# Patient Record
Sex: Female | Born: 1975 | Hispanic: Yes | Marital: Married | Smoking: Never smoker
Health system: Southern US, Community
[De-identification: ages and names within clinical notes are randomized; demographics above are authoritative.]

## PROBLEM LIST (undated history)

## (undated) ENCOUNTER — Emergency Department (HOSPITAL_COMMUNITY): Admission: EM | Payer: MEDICAID | Source: Home / Self Care

## (undated) DIAGNOSIS — H3563 Retinal hemorrhage, bilateral: Secondary | ICD-10-CM

## (undated) DIAGNOSIS — D649 Anemia, unspecified: Secondary | ICD-10-CM

## (undated) DIAGNOSIS — F32A Depression, unspecified: Secondary | ICD-10-CM

## (undated) DIAGNOSIS — R51 Headache: Secondary | ICD-10-CM

## (undated) DIAGNOSIS — R519 Headache, unspecified: Secondary | ICD-10-CM

## (undated) DIAGNOSIS — D693 Immune thrombocytopenic purpura: Secondary | ICD-10-CM

## (undated) DIAGNOSIS — F329 Major depressive disorder, single episode, unspecified: Secondary | ICD-10-CM

## (undated) HISTORY — PX: BREAST SURGERY: SHX581

## (undated) HISTORY — DX: Retinal hemorrhage, bilateral: H35.63

---

## 2000-09-10 ENCOUNTER — Emergency Department (HOSPITAL_COMMUNITY): Admission: EM | Admit: 2000-09-10 | Discharge: 2000-09-10 | Payer: Self-pay | Admitting: Emergency Medicine

## 2001-07-14 ENCOUNTER — Emergency Department (HOSPITAL_COMMUNITY): Admission: EM | Admit: 2001-07-14 | Discharge: 2001-07-14 | Payer: Self-pay | Admitting: Internal Medicine

## 2002-01-05 ENCOUNTER — Ambulatory Visit (HOSPITAL_COMMUNITY): Admission: RE | Admit: 2002-01-05 | Discharge: 2002-01-05 | Payer: Self-pay | Admitting: Obstetrics and Gynecology

## 2002-02-18 ENCOUNTER — Ambulatory Visit (HOSPITAL_COMMUNITY): Admission: RE | Admit: 2002-02-18 | Discharge: 2002-02-18 | Payer: Self-pay | Admitting: Obstetrics and Gynecology

## 2002-02-22 ENCOUNTER — Inpatient Hospital Stay (HOSPITAL_COMMUNITY): Admission: AD | Admit: 2002-02-22 | Discharge: 2002-02-24 | Payer: Self-pay | Admitting: Obstetrics and Gynecology

## 2002-08-10 ENCOUNTER — Emergency Department (HOSPITAL_COMMUNITY): Admission: EM | Admit: 2002-08-10 | Discharge: 2002-08-10 | Payer: Self-pay | Admitting: Internal Medicine

## 2002-08-14 ENCOUNTER — Emergency Department (HOSPITAL_COMMUNITY): Admission: EM | Admit: 2002-08-14 | Discharge: 2002-08-14 | Payer: Self-pay | Admitting: *Deleted

## 2003-07-23 ENCOUNTER — Emergency Department (HOSPITAL_COMMUNITY): Admission: EM | Admit: 2003-07-23 | Discharge: 2003-07-24 | Payer: Self-pay | Admitting: *Deleted

## 2004-03-28 ENCOUNTER — Ambulatory Visit: Payer: Self-pay | Admitting: Family Medicine

## 2004-04-13 ENCOUNTER — Ambulatory Visit: Payer: Self-pay | Admitting: Family Medicine

## 2004-04-14 ENCOUNTER — Ambulatory Visit (HOSPITAL_COMMUNITY): Admission: RE | Admit: 2004-04-14 | Discharge: 2004-04-14 | Payer: Self-pay | Admitting: Family Medicine

## 2004-04-14 ENCOUNTER — Ambulatory Visit: Payer: Self-pay | Admitting: *Deleted

## 2004-04-29 ENCOUNTER — Ambulatory Visit: Payer: Self-pay | Admitting: Family Medicine

## 2004-06-09 ENCOUNTER — Ambulatory Visit: Payer: Self-pay | Admitting: Family Medicine

## 2005-10-16 ENCOUNTER — Emergency Department (HOSPITAL_COMMUNITY): Admission: EM | Admit: 2005-10-16 | Discharge: 2005-10-16 | Payer: Self-pay | Admitting: Emergency Medicine

## 2006-09-11 ENCOUNTER — Emergency Department (HOSPITAL_COMMUNITY): Admission: EM | Admit: 2006-09-11 | Discharge: 2006-09-11 | Payer: Self-pay | Admitting: Emergency Medicine

## 2006-12-18 ENCOUNTER — Inpatient Hospital Stay (HOSPITAL_COMMUNITY): Admission: RE | Admit: 2006-12-18 | Discharge: 2006-12-20 | Payer: Self-pay | Admitting: Obstetrics

## 2009-11-26 ENCOUNTER — Emergency Department (HOSPITAL_COMMUNITY): Admission: EM | Admit: 2009-11-26 | Discharge: 2009-11-26 | Payer: Self-pay | Admitting: Emergency Medicine

## 2009-12-13 ENCOUNTER — Inpatient Hospital Stay (HOSPITAL_COMMUNITY)
Admission: EM | Admit: 2009-12-13 | Discharge: 2009-12-16 | Payer: Self-pay | Source: Home / Self Care | Admitting: Emergency Medicine

## 2009-12-14 ENCOUNTER — Encounter (INDEPENDENT_AMBULATORY_CARE_PROVIDER_SITE_OTHER): Payer: Self-pay | Admitting: Internal Medicine

## 2009-12-14 ENCOUNTER — Encounter (INDEPENDENT_AMBULATORY_CARE_PROVIDER_SITE_OTHER): Payer: Self-pay

## 2009-12-31 ENCOUNTER — Ambulatory Visit: Payer: Self-pay | Admitting: Internal Medicine

## 2009-12-31 DIAGNOSIS — N61 Mastitis without abscess: Secondary | ICD-10-CM

## 2010-02-24 ENCOUNTER — Encounter
Admission: RE | Admit: 2010-02-24 | Discharge: 2010-02-24 | Payer: Self-pay | Source: Home / Self Care | Attending: General Surgery | Admitting: General Surgery

## 2010-03-22 ENCOUNTER — Ambulatory Visit: Payer: Self-pay | Admitting: Oncology

## 2010-03-22 ENCOUNTER — Ambulatory Visit: Admit: 2010-03-22 | Payer: Self-pay | Admitting: Internal Medicine

## 2010-04-19 NOTE — Assessment & Plan Note (Signed)
Summary: HOSP FU/BREAST ABCESS///[place]   Vital Signs:  Patient profile:   36 year old female Height:      64 inches Weight:      191 pounds BMI:     32.90 Temp:     97.2 degrees F oral Pulse rate:   80 / minute Pulse rhythm:   regular Resp:     16 per minute BP sitting:   100 / 60  (left arm) Cuff size:   large  Vitals Entered By: Arm[place] Shannon (December 31, 2009 1:04 PM) CC: pt is here for abscess for left breast.... pt says it is draining with pus..... Is Patient Diabetic? No Pain Assessment Patient in pain? no       Does patient need assistance? Functional Status Self care Ambulation Normal   CC:  pt is here for abscess for left breast.... pt says it is draining with pus......  History of Present Illness: 35 yo female reestablishing after 3 or more years.  Concerns:  1.  Breast abscess:  was admitted to hospital with breast abscess 12/13/09 and discharged on the 29th.   Underwent I and D and biopsy of fibrotic breast mass during hospitalization with Dr. Andrey Campanile, surgery.  Biopsy did not show any malignancy.   Was initially treated for abscess with Keflex and then switched to  Clindamycin when required admission--treated for 1 more week after discharge with oral version Having some burning and itching from site.  Is still having creme colored discharge from breast.  No odor.  No fever.  Still following with surgery.  2.  Family hx of gynecologic cancers.  3 of 6  sisters and mother--all in [place].  Current Medications (verified): 1)  None  Allergies (verified): No Known Drug Allergies  Physical Exam  Breasts:  Left medical breast with deep open wound with good lining of pink-red granulation tissue after packing removed.  No discharge or odor noted.  No surrounding erythema.  Tender with removal and reinstallation of packing.  4x4 cut in half, dampened with sterile saline and loosely packed into wound.   Impression & Recommendations:  Problem # 1:  ABSCESS, BREAST,  LEFT (ICD-611.0) Healing by secondary intention. Appears to be healing well. To continue to follow with surgery.  Problem # 2:  ? of GENETIC SUSCEPTIBILITY GYNECOLOGIC MALIGNANT NEOPLASM (ICD-V84.09) Pt. to question family members to get better history, but sounds like multiple female family members with breast/ovarian cancer hx.  Complete Medication List: 1)  Hydrocodone-acetaminophen 5-500 Mg Tabs (Hydrocodone-acetaminophen) .Marland Kitchen.. 1 tab by mouth every 4 hours as needed for pain  Patient Instructions: 1)  CPP with Dr. Delrae Alfred next available--need all of sister's and mother's/grandmother's history of cancer or other health problems Prescriptions: HYDROCODONE-ACETAMINOPHEN 5-500 MG TABS (HYDROCODONE-ACETAMINOPHEN) 1 tab by mouth every 4 hours as needed for pain  #30 x 0   Entered and Authorized by:   Julieanne Manson MD   Signed by:   Julieanne Manson MD on 12/31/2009   Method used:   Print then Give to Patient   RxID:   [id number]

## 2010-04-21 NOTE — Letter (Signed)
Summary: Discharge Summary  Discharge Summary   Imported By: Arta Bruce 03/15/2010 16:53:06  _____________________________________________________________________  External Attachment:    Type:   Image     Comment:   External Document

## 2010-05-05 ENCOUNTER — Other Ambulatory Visit: Payer: Self-pay | Admitting: General Surgery

## 2010-05-05 DIAGNOSIS — N6320 Unspecified lump in the left breast, unspecified quadrant: Secondary | ICD-10-CM

## 2010-06-02 LAB — CULTURE, ROUTINE-ABSCESS: Culture: NO GROWTH

## 2010-06-02 LAB — BASIC METABOLIC PANEL
BUN: 15 mg/dL (ref 6–23)
CO2: 28 mEq/L (ref 19–32)
Calcium: 9.2 mg/dL (ref 8.4–10.5)
GFR calc non Af Amer: >60 mL/min (ref >60)
Glucose, Bld: 113 mg/dL — ABNORMAL HIGH (ref 70–99)
Potassium: 3.6 mEq/L (ref 3.5–5.1)
Sodium: 138 mEq/L (ref 135–145)

## 2010-06-02 LAB — BASIC METABOLIC PANEL WITH GFR
Chloride: 105 meq/L (ref 96–112)
Creatinine, Ser: 0.6 mg/dL (ref 0.4–1.2)
GFR calc Af Amer: >60 mL/min (ref >60)

## 2010-06-02 LAB — CBC
HCT: 37.6 % (ref 36.0–46.0)
Hemoglobin: 12.5 g/dL (ref 12.0–15.0)
MCH: 27.7 pg (ref 26.0–34.0)
MCHC: 33.2 g/dL (ref 30.0–36.0)
MCV: 83.4 fL (ref 78.0–100.0)
Platelets: 227 K/uL (ref 150–400)
RBC: 4.51 MIL/uL (ref 3.87–5.11)
RDW: 13.4 % (ref 11.5–15.5)
WBC: 7.7 K/uL (ref 4.0–10.5)

## 2010-06-02 LAB — DIFFERENTIAL
Basophils Absolute: 0 10*3/uL (ref 0.0–0.1)
Basophils Relative: 0 % (ref 0–1)
Eosinophils Absolute: 0.2 10*3/uL (ref 0.0–0.7)
Eosinophils Relative: 2 % (ref 0–5)
Lymphocytes Relative: 30 % (ref 12–46)
Lymphs Abs: 2.3 K/uL (ref 0.7–4.0)
Monocytes Absolute: 0.4 10*3/uL (ref 0.1–1.0)
Monocytes Relative: 5 % (ref 3–12)
Neutro Abs: 4.8 10*3/uL (ref 1.7–7.7)
Neutrophils Relative %: 62 % (ref 43–77)

## 2010-06-02 LAB — ANAEROBIC CULTURE

## 2010-06-06 ENCOUNTER — Ambulatory Visit: Payer: Self-pay

## 2010-06-06 ENCOUNTER — Other Ambulatory Visit: Payer: Self-pay

## 2010-08-05 NOTE — H&P (Signed)
   NAMEGLORIAN, MCDONELL                       ACCOUNT NO.:  [id number]   MEDICAL RECORD NO.:  [id number]                   PATIENT TYPE:  INP   LOCATION:  A428                                 FACILITY:  [facility]   PHYSICIAN:  Langley Gauss, M.D.                DATE OF BIRTH:  1976-01-17   DATE OF ADMISSION:  02/22/2002  DATE OF DISCHARGE:                                HISTORY & PHYSICAL   HISTORY OF PRESENT ILLNESS:  The patient is a 35 year old gravida 4 para 3  at 39-1/[redacted] weeks gestation who presents to [facility] presenting in  early labor.  The patient's prenatal course has been uncomplicated.  She did  receive prenatal care through the office of [facility].  Group B  strep is noted to be negative.   PAST OBSTETRICAL HISTORY:  She had three prior vaginal deliveries without  complications by her report - all three of these infants delivered in  [place].   ALLERGIES:  She has no known drug allergies.   PAST MEDICAL HISTORY:  Noncontributory.   MEDICATIONS:  Prenatal vitamins only.   PHYSICAL EXAMINATION:  GENERAL:  Mild distress with uterine contractions.  VITAL SIGNS:  Height 5 feet 2 inches, 171 pounds, 104/63, pulse of 77,  respiratory rate of 20.  HEENT:  Negative.  No adenopathy.  Neck is supple.  Thyroid is nonpalpable.  LUNGS:  Clear.  CARDIOVASCULAR:  Regular rate and rhythm.  ABDOMEN:  Soft and nontender.  No surgical scars are identified.  She is  vertex presentation by Leopold's maneuver.  Fundal height is 36 cm.  External fetal monitor reveals irregular uterine contractions occurring q.2-  42m. with a reassuring fetal heart rate.  CERVIX: Cervix is 4 cm dilated, -1 station, 70% effaced, vertex  presentation.  Fetal scalp electrode was placed with resultant assisted  rupture of membranes; clear amniotic fluid is noted.   ASSESSMENT AND PLAN:  Term intrauterine pregnancy, gravida 4 para 3, with  clear amniotic fluid.  The patient may possibly  progress very rapidly  through the course of labor and is thus to be monitored very closely.                                               Langley Gauss, M.D.    DC/MEDQ  D:  02/24/2002  T:  02/24/2002  Job:  [id number]   cc:   [facility]  [address]  [place], [place] [address]

## 2010-08-05 NOTE — Op Note (Signed)
NAMESAMARIE, [name]                       ACCOUNT NO.:  [id number]   MEDICAL RECORD NO.:  [id number]                   PATIENT TYPE:  INP   LOCATION:  A428                                 FACILITY:  [facility]   PHYSICIAN:  [name], M.D.                DATE OF BIRTH:  [birth year]   DATE OF PROCEDURE:  02/22/2002  DATE OF DISCHARGE:                                 OPERATIVE REPORT   DESCRIPTION OF PROCEDURE:   DIAGNOSIS:  A 39+ week intrauterine pregnancy, presenting in labor.   DELIVERY FORM:  Spontaneous assisted vaginal delivery of a viable female  infant delivered over an intact perineum.  Finding at time of delivery  included a nuchal cord x1 without compression, which is reduced.  Delivery  performed by [name], M.D.   ESTIMATED BLOOD LOSS:  Less than 500 cc.   MEDICATIONS:  Analgesia during the course of labor is IV Nubain only.   SPECIMENS:  Arterial cord gas and cord blood are obtained.  The placenta is  examined and noted to be apparently intact with a three-vessel umbilical  cord.   COMPLICATIONS OF DELIVERY:  None.   SUMMARY:  The patient presented at 39+ weeks' gestation, presenting in the  early stages of active labor in this gravida 4, para 3.  Initial  examination:  4 cm dilated.  A fetal scalp electrode was placed with  resultant amniotomy, findings of clear amniotic fluid.  The patient had a  reassuring fetal heart rate; however, thereafter she failed to achieve  adequate labor pattern.  Thus, Pitocin augmentation was necessary due to  inadequate frequency and intensity of uterine contractions.  With onset of  discomfortable uterine contractions, the patient requested IV pain  medication only.  The patient remained at 6 cm dilatation times several  hours; however, with improvement in labor pattern she progressed very  rapidly to complete dilatation.  She was placed in the dorsal lithotomy  position and prepped and draped in the usual sterile  manner, pushed very  effectively during a short second stage of labor to deliver in a direct OA  position over an intact perineum.  Mouth and nares were bulb-suctioned of  clear amniotic fluid, and a nuchal cord x1 is reduced.  Spontaneous rotation  occurred to a left anterior shoulder position, and gentle downward traction  combined with expulsive efforts resulted in delivery of this anterior  shoulder beneath the pubic symphysis without difficulty.  The umbilical cord  is then milked toward the infant, and the cord is doubly clamped and cut,  and infant is handed to the nursing staff for immediate assessment.  Arterial cord gas and cord blood was then obtained.  Gentle traction on the  umbilical cord results in separation, which upon examination appears to be  an  intact three-vessel placenta and cord.  Excellent uterine tone is achieved  following delivery of the placenta.  Examination of the  genital tract  reveals no lacerations.  Both mother and infant are doing very well  following delivery with no complications.                                                [name], M.D.    DC/MEDQ  D:  02/24/2002  T:  02/24/2002  Job:  [id number]

## 2010-12-03 ENCOUNTER — Emergency Department (HOSPITAL_COMMUNITY)
Admission: EM | Admit: 2010-12-03 | Discharge: 2010-12-04 | Disposition: A | Discharge status: Home / Self Care | Payer: Self-pay | Attending: Emergency Medicine | Admitting: Emergency Medicine

## 2010-12-03 DIAGNOSIS — R0789 Other chest pain: Secondary | ICD-10-CM | POA: Insufficient documentation

## 2010-12-04 ENCOUNTER — Emergency Department (HOSPITAL_COMMUNITY): Payer: Self-pay

## 2010-12-04 LAB — POCT I-STAT, CHEM 8
BUN: 16 mg/dL (ref 6–23)
Calcium, Ion: 1.21 mmol/L (ref 1.12–1.32)
HCT: 37 % (ref 36.0–46.0)
Hemoglobin: 12.6 g/dL (ref 12.0–15.0)
Sodium: 138 mEq/L (ref 135–145)
TCO2: 24 mmol/L (ref 0–100)

## 2010-12-04 LAB — D-DIMER, QUANTITATIVE: D-Dimer, Quant: 0.32 ug/mL-FEU (ref 0.00–0.48)

## 2010-12-29 LAB — CBC
HCT: 27.3 — ABNORMAL LOW
HCT: 33.6 — ABNORMAL LOW
Hemoglobin: 11.4 — ABNORMAL LOW
Platelets: 163
RDW: 13.9
RDW: 14.6 — ABNORMAL HIGH
WBC: 10
WBC: 9.2

## 2010-12-29 LAB — ABO/RH: ABO/RH(D): O POS

## 2010-12-29 LAB — TYPE AND SCREEN
ABO/RH(D): O POS
Antibody Screen: NEGATIVE

## 2010-12-29 LAB — HEPATITIS B SURFACE ANTIGEN: Hepatitis B Surface Ag: NEGATIVE

## 2010-12-29 LAB — RAPID HIV SCREEN (WH-MAU): Rapid HIV Screen: NONREACTIVE

## 2011-10-02 ENCOUNTER — Emergency Department (HOSPITAL_COMMUNITY)
Admission: EM | Admit: 2011-10-02 | Discharge: 2011-10-02 | Disposition: A | Discharge status: Home / Self Care | Payer: Self-pay | Attending: Emergency Medicine | Admitting: Emergency Medicine

## 2011-10-02 ENCOUNTER — Encounter (HOSPITAL_COMMUNITY): Payer: Self-pay | Admitting: *Deleted

## 2011-10-02 DIAGNOSIS — O99891 Other specified diseases and conditions complicating pregnancy: Secondary | ICD-10-CM | POA: Insufficient documentation

## 2011-10-02 DIAGNOSIS — Z3490 Encounter for supervision of normal pregnancy, unspecified, unspecified trimester: Secondary | ICD-10-CM

## 2011-10-02 DIAGNOSIS — R51 Headache: Secondary | ICD-10-CM | POA: Insufficient documentation

## 2011-10-02 DIAGNOSIS — R1110 Vomiting, unspecified: Secondary | ICD-10-CM | POA: Insufficient documentation

## 2011-10-02 DIAGNOSIS — R112 Nausea with vomiting, unspecified: Secondary | ICD-10-CM | POA: Insufficient documentation

## 2011-10-02 DIAGNOSIS — R42 Dizziness and giddiness: Secondary | ICD-10-CM | POA: Insufficient documentation

## 2011-10-02 LAB — URINALYSIS, ROUTINE W REFLEX MICROSCOPIC
Nitrite: NEGATIVE
Specific Gravity, Urine: 1.024 (ref 1.005–1.030)
pH: 6.5 (ref 5.0–8.0)

## 2011-10-02 LAB — CBC WITH DIFFERENTIAL/PLATELET
Basophils Relative: 0 % (ref 0–1)
Hemoglobin: 12.6 g/dL (ref 12.0–15.0)
Lymphocytes Relative: 25 % (ref 12–46)
MCHC: 33.9 g/dL (ref 30.0–36.0)
Monocytes Relative: 5 % (ref 3–12)
Neutro Abs: 5.7 10*3/uL (ref 1.7–7.7)
Neutrophils Relative %: 70 % (ref 43–77)
RBC: 4.47 MIL/uL (ref 3.87–5.11)
WBC: 8.2 10*3/uL (ref 4.0–10.5)

## 2011-10-02 LAB — BASIC METABOLIC PANEL
BUN: 9 mg/dL (ref 6–23)
CO2: 21 mEq/L (ref 19–32)
Chloride: 103 mEq/L (ref 96–112)
GFR calc Af Amer: >90 mL/min (ref >90)
Potassium: 3.7 mEq/L (ref 3.5–5.1)

## 2011-10-02 LAB — URINE MICROSCOPIC-ADD ON

## 2011-10-02 MED ORDER — SODIUM CHLORIDE 0.9 % IV BOLUS (SEPSIS)
1000.0000 mL | Freq: Once | INTRAVENOUS | Status: AC
  Administered 2011-10-02: 1000 mL via INTRAVENOUS

## 2011-10-02 MED ORDER — PRENATAL COMPLETE 14-0.4 MG PO TABS: 1.0000 | ORAL_TABLET | Freq: Every day | ORAL | Status: DC

## 2011-10-02 MED ORDER — METOCLOPRAMIDE HCL 10 MG PO TABS: 10.0000 mg | ORAL_TABLET | Freq: Four times a day (QID) | ORAL | Status: DC | PRN

## 2011-10-02 MED ORDER — MECLIZINE HCL 25 MG PO TABS
25.0000 mg | ORAL_TABLET | Freq: Once | ORAL | Status: AC
  Administered 2011-10-02: 25 mg via ORAL
  Filled 2011-10-02: qty 1

## 2011-10-02 MED ORDER — DIPHENHYDRAMINE HCL 50 MG/ML IJ SOLN
25.0000 mg | Freq: Once | INTRAMUSCULAR | Status: AC
  Administered 2011-10-02: 25 mg via INTRAVENOUS
  Filled 2011-10-02: qty 1

## 2011-10-02 MED ORDER — METOCLOPRAMIDE HCL 5 MG/ML IJ SOLN
10.0000 mg | Freq: Once | INTRAMUSCULAR | Status: AC
  Administered 2011-10-02: 10 mg via INTRAVENOUS
  Filled 2011-10-02: qty 2

## 2011-10-02 NOTE — ED Provider Notes (Signed)
History     CSN: [id number]  Arrival date & time 10/02/11  1104   First MD Initiated Contact with Patient 10/02/11 1122      Chief Complaint  Patient presents with  . Headache  . Dizziness  . Emesis    (Consider location/radiation/quality/duration/timing/severity/associated sxs/prior treatment) Patient is a 36 y.o. female presenting with headaches and vomiting. The history is provided by the patient.  Headache  Associated symptoms include vomiting.  Emesis  Associated symptoms include headaches.  For the last 2 weeks, she has had an occipital headache, vertigo, and nausea and vomiting. Symptoms have been steady in neither worsening nor improving. She has taken over-the-counter acetaminophen with no relief. She denies photophobia and phonophobia. Nothing makes her symptoms better and nothing makes them worse. She denies fever, chills, sweats. Pain is moderate and she rates it at 6/10.  History reviewed. No pertinent past medical history.  Past Surgical History  Procedure Date  . Breast surgery     History reviewed. No pertinent family history.  History  Substance Use Topics  . Smoking status: Never Smoker   . Smokeless tobacco: Not on file  . Alcohol Use:     OB History    Grav Para Term Preterm Abortions TAB SAB Ect Mult Living                  Review of Systems  Gastrointestinal: Positive for vomiting.  Neurological: Positive for headaches.  All other systems reviewed and are negative.    Allergies  Review of patient's allergies indicates no known allergies.  Home Medications  No current outpatient prescriptions on file.  BP 104/52  Pulse 78  Temp 98.2 F (36.8 C) (Oral)  Resp 16  SpO2 98%  Physical Exam  Nursing note and vitals reviewed.  36 year old female who is resting comfortably and in no acute distress. Vital signs are normal. Oxygen saturation is 98% which is normal. Head is normocephalic and atraumatic. PERRLA, EOMI. Fundi show no  hemorrhage, exudate, papilledema. There is no nystagmus seen. Neck is nontender and supple. There is no adenopathy. There is moderate tenderness to palpation over the paracervical musculature. Dizziness is reproduced by head movement. Back is nontender. Lungs are clear without rales, wheezes, rhonchi. Heart has regular rate rhythm without murmur. Abdomen is soft, flat, nontender without masses or hepatosplenomegaly. Extremities have no cyanosis or edema, full range of motion is present. Skin is warm and dry without rash. Neurologic: Mental status is normal, cranial nerves are intact, there are no motor or sensory deficits.   ED Course  Procedures (including critical care time)  Results for orders placed during the hospital encounter of 10/02/11  CBC WITH DIFFERENTIAL      Component Value Range   WBC 8.2  4.0 - 10.5 K/uL   RBC 4.47  3.87 - 5.11 MIL/uL   Hemoglobin 12.6  12.0 - 15.0 g/dL   HCT 52.8  41.3 - 24.4 %   MCV 83.2  78.0 - 100.0 fL   MCH 28.2  26.0 - 34.0 pg   MCHC 33.9  30.0 - 36.0 g/dL   RDW 01.0  27.2 - 53.6 %   Platelets 195  150 - 400 K/uL   Neutrophils Relative 70  43 - 77 %   Neutro Abs 5.7  1.7 - 7.7 K/uL   Lymphocytes Relative 25  12 - 46 %   Lymphs Abs 2.1  0.7 - 4.0 K/uL   Monocytes Relative 5  3 - 12 %  Monocytes Absolute 0.4  0.1 - 1.0 K/uL   Eosinophils Relative 1  0 - 5 %   Eosinophils Absolute 0.1  0.0 - 0.7 K/uL   Basophils Relative 0  0 - 1 %   Basophils Absolute 0.0  0.0 - 0.1 K/uL  BASIC METABOLIC PANEL      Component Value Range   Sodium 136  135 - 145 mEq/L   Potassium 3.7  3.5 - 5.1 mEq/L   Chloride 103  96 - 112 mEq/L   CO2 21  19 - 32 mEq/L   Glucose, Bld 96  70 - 99 mg/dL   BUN 9  6 - 23 mg/dL   Creatinine, Ser 1.61  0.50 - 1.10 mg/dL   Calcium 9.5  8.4 - 09.6 mg/dL   GFR calc non Af Amer >90  >90 mL/min   GFR calc Af Amer >90  >90 mL/min  URINALYSIS, ROUTINE W REFLEX MICROSCOPIC      Component Value Range   Color, Urine YELLOW  YELLOW    APPearance CLOUDY (*) CLEAR   Specific Gravity, Urine 1.024  1.005 - 1.030   pH 6.5  5.0 - 8.0   Glucose, UA NEGATIVE  NEGATIVE mg/dL   Hgb urine dipstick NEGATIVE  NEGATIVE   Bilirubin Urine NEGATIVE  NEGATIVE   Ketones, ur NEGATIVE  NEGATIVE mg/dL   Protein, ur NEGATIVE  NEGATIVE mg/dL   Urobilinogen, UA 0.2  0.0 - 1.0 mg/dL   Nitrite NEGATIVE  NEGATIVE   Leukocytes, UA LARGE (*) NEGATIVE  POCT PREGNANCY, URINE      Component Value Range   Preg Test, Ur POSITIVE (*) NEGATIVE  URINE MICROSCOPIC-ADD ON      Component Value Range   Squamous Epithelial / LPF MANY (*) RARE   WBC, UA 11-20  <3 WBC/hpf   RBC / HPF 0-2  <3 RBC/hpf   Bacteria, UA FEW (*) RARE    1. Nausea and vomiting   2. Headache   3. Pregnancy       MDM  Headache, vertigo, vomiting. I suspect that the headache is secondary to her dizziness and vomiting. She'll be given IV fluids, IV antiemetics, and oral meclizine and reassessed.  After IV fluids, IV antiemetics, oral meclizine, she is feeling much better. Pregnancy test is come back positive and it is most likely the cause of all of her symptoms. Urinalysis is a contaminated specimen and does not show evidence of infection. She is sent home with prescriptions for prenatal vitamins and metoclopramide for nausea.        Dione Booze, MD 10/02/11 [phone]

## 2011-10-02 NOTE — ED Notes (Addendum)
Pt reports 2 week hx of occipital headache with nausea,vomiting, and dizziness. gcs 15. Perrla. Denies any injury.

## 2011-10-07 ENCOUNTER — Emergency Department (HOSPITAL_COMMUNITY)
Admission: EM | Admit: 2011-10-07 | Discharge: 2011-10-07 | Discharge status: Against Medical Advice | Payer: Self-pay | Attending: Emergency Medicine | Admitting: Emergency Medicine

## 2011-10-07 DIAGNOSIS — Z049 Encounter for examination and observation for unspecified reason: Secondary | ICD-10-CM | POA: Insufficient documentation

## 2011-12-05 ENCOUNTER — Other Ambulatory Visit (HOSPITAL_COMMUNITY): Payer: Self-pay | Admitting: Nurse Practitioner

## 2011-12-05 ENCOUNTER — Encounter (HOSPITAL_COMMUNITY): Payer: Self-pay | Admitting: Physician Assistant

## 2011-12-05 DIAGNOSIS — Z3689 Encounter for other specified antenatal screening: Secondary | ICD-10-CM

## 2011-12-08 ENCOUNTER — Ambulatory Visit (HOSPITAL_COMMUNITY)
Admission: RE | Admit: 2011-12-08 | Discharge: 2011-12-08 | Disposition: A | Discharge status: Home / Self Care | Payer: Self-pay | Source: Ambulatory Visit | Attending: Nurse Practitioner | Admitting: Nurse Practitioner

## 2011-12-08 DIAGNOSIS — O358XX0 Maternal care for other (suspected) fetal abnormality and damage, not applicable or unspecified: Secondary | ICD-10-CM | POA: Insufficient documentation

## 2011-12-08 DIAGNOSIS — Z1389 Encounter for screening for other disorder: Secondary | ICD-10-CM | POA: Insufficient documentation

## 2011-12-08 DIAGNOSIS — Z363 Encounter for antenatal screening for malformations: Secondary | ICD-10-CM | POA: Insufficient documentation

## 2011-12-08 DIAGNOSIS — Z3689 Encounter for other specified antenatal screening: Secondary | ICD-10-CM

## 2011-12-12 ENCOUNTER — Ambulatory Visit (HOSPITAL_COMMUNITY): Payer: Self-pay

## 2011-12-15 ENCOUNTER — Ambulatory Visit (HOSPITAL_COMMUNITY)
Admission: RE | Admit: 2011-12-15 | Discharge: 2011-12-15 | Disposition: A | Discharge status: Home / Self Care | Payer: Self-pay | Source: Ambulatory Visit | Attending: Nurse Practitioner | Admitting: Nurse Practitioner

## 2011-12-18 NOTE — Progress Notes (Signed)
Genetic Counseling  High-Risk Gestation Note  Appointment Date:  12/15/2011 Referred By: Currie Paris, NP Date of Birth:  [birth year]    Pregnancy History: G6P5 Estimated Date of Delivery: 05/02/2012 Estimated Gestational Age: [redacted]w[redacted]d Attending: Particia Nearing, MD     [name] was seen for genetic counseling because of a maternal age of 36 y.o..   Spanish/English medical interpretation provided by [facility] [phone] vis telephone for the first part of visit and by [facility] interpreter Byrd Hesselbach for second part of visit.   She was counseled regarding maternal age and the association with risk for chromosome conditions due to nondisjunction with aging of the ova.   We reviewed chromosomes, nondisjunction, and the associated 1 in 27 risk for fetal aneuploidy related to a maternal age of 36 y.o. at [redacted]w[redacted]d gestation.  She was counseled that the risk for aneuploidy decreases as gestational age increases, accounting for those pregnancies which spontaneously abort.  We specifically discussed Down syndrome (trisomy 54), trisomies 39 and 29, and sex chromosome aneuploidies (47,XXX and 47,XXY) including the common features and prognoses of each.   We reviewed available screening options including Quad screen, noninvasive prenatal testing (NIPT), and detailed ultrasound. She understands that screening tests are used to modify a patient's a priori risk for aneuploidy, typically based on age.  This estimate provides a pregnancy specific risk assessment.  Specifically, we discussed that NIPT analyzes cell free fetal DNA found in the maternal circulation. This test is not diagnostic for chromosome conditions, but can provide information regarding the presence or absence of extra fetal DNA for chromosomes 13, 18, 21, X, and Y, and missing fetal DNA for chromosome X and Y (Turner syndrome). Thus, it would not identify or rule out all genetic conditions. The reported detection rate is greater than  99% for Trisomy 21, greater than 98% for Trisomy 18, and is approximately 80% (8 out of 10) for Trisomy 13. The false positive rate is reported to be less than 0.1% for any of these conditions.  In addition, we discussed that ~50-80% of fetuses with Down syndrome and up to 90-95% of fetuses with trisomy 18/13, when well visualized, have detectable anomalies or soft markers by detailed ultrasound (~18+ weeks gestation). We reviewed that [name] previously had ultrasound performed through [facility] on 12/08/11. Visualized fetal anatomy was reported as normal from this previous ultrasound. Ultrasound was not performed in our office at the time of today's visit.   She was also counseled regarding diagnostic testing via amniocentesis.  We reviewed the approximate 1 in 300-500 risk for complications for amniocentesis, including spontaneous pregnancy loss. We discussed the risks, limitations, and benefits of each screening and testing option. After consideration of all the options, [name] declined all additional screening and testing for fetal aneuploidy including Quad screen, NIPT, and amniocentesis.    [name] was provided with written information regarding sickle cell anemia (SCA) including the carrier frequency and incidence in the Hispanic population, the availability of carrier testing and prenatal diagnosis if indicated.  In addition, we discussed that hemoglobinopathies are routinely screened for as part of the [place] newborn screening panel.  She declined hemoglobin electrophoresis today.  Both family histories were reviewed and found to be noncontributory for birth defects, mental retardation, and known genetic conditions. Without further information regarding the provided family history, an accurate genetic risk cannot be calculated. Further genetic counseling is warranted if more information is obtained. The father of the pregnancy is  reportedly  32 years old. Advanced paternal age (APA) is defined as paternal age greater than or equal to age 44.  Recent large-scale sequencing studies have shown that approximately 80% of de novo point mutations are of paternal origin.  Many studies have demonstrated a strong correlation between increased paternal age and de novo point mutations.  Although no specific data is available regarding fetal risks for fathers 62+ years old at conception, it is apparent that the overall risk for single gene conditions is increased.  It is estimated that the overall chance for a de novo mutation is ~0.5%.  There is a wide range of conditions which can be caused by new dominant gene mutations (achondroplasia, neurofibromatosis, Marfan syndrome etc.).  Genetic testing for each individual single gene condition is not warranted or available unless ultrasound or family history concerns lend suspicion to a specific condition.    [name] denied exposure to environmental toxins or chemical agents. She denied the use of alcohol, tobacco or street drugs. She denied significant viral illnesses during the course of her pregnancy. Her medical and surgical histories were noncontributory.   I counseled [name] regarding the above risks and available options.  The approximate face-to-face time with the genetic counselor was 40 minutes.  Quinn Plowman, MS,  Certified Genetic Counselor 12/18/2011

## 2012-01-22 ENCOUNTER — Other Ambulatory Visit (HOSPITAL_COMMUNITY): Payer: Self-pay | Admitting: Family

## 2012-01-22 DIAGNOSIS — O4440 Low lying placenta NOS or without hemorrhage, unspecified trimester: Secondary | ICD-10-CM

## 2012-02-12 ENCOUNTER — Ambulatory Visit (HOSPITAL_COMMUNITY)
Admission: RE | Admit: 2012-02-12 | Discharge: 2012-02-12 | Disposition: A | Discharge status: Home / Self Care | Payer: Self-pay | Source: Ambulatory Visit | Attending: Family | Admitting: Family

## 2012-02-12 DIAGNOSIS — Z3689 Encounter for other specified antenatal screening: Secondary | ICD-10-CM | POA: Insufficient documentation

## 2012-02-12 DIAGNOSIS — O4440 Low lying placenta NOS or without hemorrhage, unspecified trimester: Secondary | ICD-10-CM

## 2012-02-12 DIAGNOSIS — O4400 Placenta previa specified as without hemorrhage, unspecified trimester: Secondary | ICD-10-CM | POA: Insufficient documentation

## 2012-03-20 NOTE — L&D Delivery Note (Signed)
Delivery Note At 2:35 AM a viable female was delivered via Vaginal, Spontaneous Delivery (Presentation: ; Occiput Anterior).  APGAR: 9, 9; weight pending.   Placenta status: Intact, Spontaneous delivery.  Cord: 3 vessels with the following complications: None.  Cord pH: None  Anesthesia: None  Episiotomy: None Lacerations: None Suture Repair: N/A Est. Blood Loss (mL): 250  Mom to postpartum.  Baby to nursery-stable.  Twana First Paulina Fusi, DO of [facility] 04/24/2012, 3:16 AM

## 2012-03-20 NOTE — L&D Delivery Note (Signed)
I have seen and examined this patient and I agree with the above. Cam Hai 4:26 AM 04/24/2012

## 2012-04-16 ENCOUNTER — Other Ambulatory Visit (HOSPITAL_COMMUNITY): Payer: Self-pay | Admitting: Physician Assistant

## 2012-04-16 DIAGNOSIS — O3660X0 Maternal care for excessive fetal growth, unspecified trimester, not applicable or unspecified: Secondary | ICD-10-CM

## 2012-04-16 DIAGNOSIS — O4190X0 Disorder of amniotic fluid and membranes, unspecified, unspecified trimester, not applicable or unspecified: Secondary | ICD-10-CM

## 2012-04-18 ENCOUNTER — Ambulatory Visit (HOSPITAL_COMMUNITY)
Admission: RE | Admit: 2012-04-18 | Discharge: 2012-04-18 | Disposition: A | Discharge status: Home / Self Care | Payer: Self-pay | Source: Ambulatory Visit | Attending: Physician Assistant | Admitting: Physician Assistant

## 2012-04-18 DIAGNOSIS — O4190X0 Disorder of amniotic fluid and membranes, unspecified, unspecified trimester, not applicable or unspecified: Secondary | ICD-10-CM

## 2012-04-18 DIAGNOSIS — O3660X0 Maternal care for excessive fetal growth, unspecified trimester, not applicable or unspecified: Secondary | ICD-10-CM | POA: Insufficient documentation

## 2012-04-18 DIAGNOSIS — Z3689 Encounter for other specified antenatal screening: Secondary | ICD-10-CM | POA: Insufficient documentation

## 2012-04-19 ENCOUNTER — Encounter (HOSPITAL_COMMUNITY): Payer: Self-pay

## 2012-04-19 ENCOUNTER — Inpatient Hospital Stay (HOSPITAL_COMMUNITY)
Admission: AD | Admit: 2012-04-19 | Discharge: 2012-04-19 | Disposition: A | Discharge status: Home / Self Care | Payer: Self-pay | Source: Ambulatory Visit | Attending: Obstetrics and Gynecology | Admitting: Obstetrics and Gynecology

## 2012-04-19 DIAGNOSIS — O479 False labor, unspecified: Secondary | ICD-10-CM | POA: Insufficient documentation

## 2012-04-19 NOTE — MAU Provider Note (Signed)
Attestation of Attending Supervision of Advanced Practitioner (CNM/NP): Evaluation and management procedures were performed by the Advanced Practitioner under my supervision and collaboration.  I have reviewed the Advanced Practitioner's note and chart, and I agree with the management and plan.  Keisha Amer 04/19/2012 9:25 PM

## 2012-04-19 NOTE — MAU Provider Note (Signed)
  History   [name] is a 37 y.o. [id number] at [redacted]w[redacted]d who presents to the MAU due to contractions occuring about every 10 minutes starting at 11:00 last night, though they have resolved at this time. She has not noted any bleeding, loss of fluid, and continues to feel good fetal movement.      CSN: [id number]  Arrival date and time: 04/19/12 1149   First Provider Initiated Contact with Patient 04/19/12 1308      Chief Complaint  Patient presents with  . Labor Eval   HPI  OB History    Grav Para Term Preterm Abortions TAB SAB Ect Mult Living   6 5 5  0 0 0 0 0 0 5      History reviewed. No pertinent past medical history.  Past Surgical History  Procedure Date  . Breast surgery     History reviewed. No pertinent family history.  History  Substance Use Topics  . Smoking status: Never Smoker   . Smokeless tobacco: Not on file  . Alcohol Use:     Allergies: No Known Allergies  Prescriptions prior to admission  Medication Sig Dispense Refill  . Prenatal Vit-Fe Fumarate-FA (PRENATAL COMPLETE) 14-0.4 MG TABS Take 1 tablet by mouth daily.  30 each  0    Review of Systems  Constitutional: Negative for fever.  Eyes: Negative for blurred vision.  Respiratory: Negative for cough.   Cardiovascular: Negative for chest pain.  Gastrointestinal: Negative for nausea, vomiting, diarrhea and constipation.  Genitourinary: Negative for dysuria and hematuria.  Neurological: Negative for dizziness, tingling and headaches.   Physical Exam   Blood pressure 116/75, pulse 78, temperature 98.4 F (36.9 C), temperature source Oral, resp. rate 20, height 4' 10.5" (1.486 m), weight 101.969 kg (224 lb 12.8 oz), last menstrual period 07/27/2011, SpO2 99.00%, unknown if currently breastfeeding.  Physical Exam  Constitutional: She is oriented to person, place, and time. She appears well-developed and well-nourished. No distress.  HENT:  Head: Normocephalic and atraumatic.  Eyes:  Conjunctivae normal are normal. Pupils are equal, round, and reactive to light.  Neck: Normal range of motion. Neck supple.  Cardiovascular: Normal rate and intact distal pulses.   Respiratory: Effort normal. No respiratory distress.  GI: There is no tenderness.       gravid  Genitourinary: Vagina normal and uterus normal.  Musculoskeletal: Normal range of motion. She exhibits no edema.  Neurological: She is alert and oriented to person, place, and time.  Skin: Skin is warm and dry.  Psychiatric: She has a normal mood and affect. Her behavior is normal. Thought content normal.  Dilation:  (outer os 2 funnels to close) Effacement (%): Thick Exam by:: Anna Genre resident/ Gmorris RN  FHT: 135, +accels, no decels Toco: no contractions on monitor  MAU Course  Procedures   Assessment and Plan  36 y.o. [name] who presents for contractions overnight that have now abated. - cervix closed, no ROM - FHR reassuring Given labor precautions, to follow up as previously scheduled for [facility]    Bonnita Nasuti 04/19/2012, 1:10 PM   Seen also by me Agree with note Wynelle Bourgeois CNM

## 2012-04-19 NOTE — MAU Note (Signed)
Hospital translator in for triage. Patient states she had a lot of contractions last night but much less this am. Reports good fetal movement, no bleeding or leaking.

## 2012-04-23 ENCOUNTER — Inpatient Hospital Stay (HOSPITAL_COMMUNITY)
Admission: AD | Admit: 2012-04-23 | Discharge: 2012-04-25 | DRG: 774 | Disposition: A | Discharge status: Home / Self Care | Payer: Medicaid Other | Source: Ambulatory Visit | Attending: Family Medicine | Admitting: Family Medicine

## 2012-04-23 ENCOUNTER — Encounter (HOSPITAL_COMMUNITY): Payer: Self-pay | Admitting: *Deleted

## 2012-04-23 DIAGNOSIS — O4693 Antepartum hemorrhage, unspecified, third trimester: Secondary | ICD-10-CM

## 2012-04-23 DIAGNOSIS — O09529 Supervision of elderly multigravida, unspecified trimester: Secondary | ICD-10-CM | POA: Diagnosis present

## 2012-04-23 DIAGNOSIS — O4690 Antepartum hemorrhage, unspecified, unspecified trimester: Principal | ICD-10-CM | POA: Diagnosis present

## 2012-04-23 HISTORY — DX: Depression, unspecified: F32.A

## 2012-04-23 HISTORY — DX: Major depressive disorder, single episode, unspecified: F32.9

## 2012-04-23 LAB — CBC
HCT: 34.2 % — ABNORMAL LOW (ref 36.0–46.0)
Hemoglobin: 11.4 g/dL — ABNORMAL LOW (ref 12.0–15.0)
MCH: 28.4 pg (ref 26.0–34.0)
MCHC: 33.3 g/dL (ref 30.0–36.0)
MCV: 85.1 fL (ref 78.0–100.0)
RDW: 15.4 % (ref 11.5–15.5)

## 2012-04-23 LAB — OB RESULTS CONSOLE HIV ANTIBODY (ROUTINE TESTING): HIV: NONREACTIVE

## 2012-04-23 MED ORDER — IBUPROFEN 600 MG PO TABS
600.0000 mg | ORAL_TABLET | Freq: Four times a day (QID) | ORAL | Status: DC | PRN
  Administered 2012-04-24: 600 mg via ORAL

## 2012-04-23 MED ORDER — LACTATED RINGERS IV SOLN: 500.0000 mL | INTRAVENOUS | Status: DC | PRN

## 2012-04-23 MED ORDER — LACTATED RINGERS IV SOLN
INTRAVENOUS | Status: DC
  Administered 2012-04-23: 22:00:00 via INTRAVENOUS

## 2012-04-23 MED ORDER — LIDOCAINE HCL (PF) 1 % IJ SOLN
30.0000 mL | INTRAMUSCULAR | Status: DC | PRN
  Filled 2012-04-23: qty 30

## 2012-04-23 MED ORDER — OXYCODONE-ACETAMINOPHEN 5-325 MG PO TABS: 1.0000 | ORAL_TABLET | ORAL | Status: DC | PRN

## 2012-04-23 MED ORDER — CITRIC ACID-SODIUM CITRATE 334-500 MG/5ML PO SOLN: 30.0000 mL | ORAL | Status: DC | PRN

## 2012-04-23 MED ORDER — MISOPROSTOL 25 MCG QUARTER TABLET
25.0000 ug | ORAL_TABLET | ORAL | Status: DC | PRN
  Administered 2012-04-23: 25 ug via VAGINAL
  Filled 2012-04-23: qty 0.25

## 2012-04-23 MED ORDER — OXYTOCIN BOLUS FROM INFUSION: 500.0000 mL | INTRAVENOUS | Status: DC

## 2012-04-23 MED ORDER — ONDANSETRON HCL 4 MG/2ML IJ SOLN: 4.0000 mg | Freq: Four times a day (QID) | INTRAMUSCULAR | Status: DC | PRN

## 2012-04-23 MED ORDER — OXYTOCIN 40 UNITS IN LACTATED RINGERS INFUSION - SIMPLE MED
62.5000 mL/h | INTRAVENOUS | Status: DC
  Filled 2012-04-23: qty 1000

## 2012-04-23 MED ORDER — ACETAMINOPHEN 325 MG PO TABS: 650.0000 mg | ORAL_TABLET | ORAL | Status: DC | PRN

## 2012-04-23 MED ORDER — TERBUTALINE SULFATE 1 MG/ML IJ SOLN: 0.2500 mg | Freq: Once | INTRAMUSCULAR | Status: AC | PRN

## 2012-04-23 NOTE — Progress Notes (Deleted)
Pt fhr tracing under incorrect pt name (stephens) on 04/23/12 at [phone]. Corrected at 1940.

## 2012-04-23 NOTE — H&P (Signed)
HPI: [name] is a 37 y.o. year old G31P5005 female at [redacted]w[redacted]d weeks gestation by LMP who presents for  IOL for vaginal bleeding.  She was reporting one episode of bright red bleeding in the toilet today and when she wiped afterwards. She had a low-lying placenta early in pregnancy which resolved. She reports good fetal movement, denies regular contractions, LOF, vaginal itching/burning, urinary symptoms, h/a, dizziness, n/v, or fever/chills.   Denies worsening edema   Maternal Medical History:  Reason for admission: Reason for admission: vaginal bleeding.  Fetal activity: Perceived fetal activity is normal.    Prenatal complications: No hypertension or pre-eclampsia.     OB History    Grav Para Term Preterm Abortions TAB SAB Ect Mult Living   6 5 5  0 0 0 0 0 0 5     Past Medical History  Diagnosis Date  . Depression    Past Surgical History  Procedure Date  . Breast surgery biopsy   Family History: family history is not on file. Social History:  reports that she has never smoked. She does not have any smokeless tobacco history on file. She reports that she does not drink alcohol or use illicit drugs.   Prenatal Transfer Tool  Maternal Diabetes: No Genetic Screening: Declined Maternal Ultrasounds/Referrals: Normal Fetal Ultrasounds or other Referrals:  None Maternal Substance Abuse:  No Significant Maternal Medications:  None Significant Maternal Lab Results:  Lab values include: Group B Strep negative Other Comments:  None  Review of Systems  Constitutional: Negative.  Negative for fever and chills.  HENT: Negative.   Eyes: Negative.  Negative for blurred vision and double vision.  Respiratory: Negative.   Gastrointestinal: Negative.   Genitourinary: Negative.   Neurological: Negative for dizziness and headaches.  Psychiatric/Behavioral: Negative for substance abuse.    Dilation: 2 Effacement (%): Thick Station: -3 Exam by:: dr. Paulina Fusi Blood pressure  126/88, pulse 67, temperature 98 F (36.7 C), temperature source Oral, resp. rate 18, last menstrual period 07/27/2011, unknown if currently breastfeeding. Maternal Exam:  Cervix: Cervix evaluated by digital exam.     Physical Exam  Constitutional: She is oriented to person, place, and time. She appears well-developed and well-nourished. No distress.  HENT:  Head: Normocephalic and atraumatic.  Eyes: EOM are normal.  Cardiovascular: Normal rate and regular rhythm.   GI: Bowel sounds are normal.  Neurological: She is alert and oriented to person, place, and time.  Skin: Skin is warm and dry.    Prenatal labs: ABO, Rh:   Antibody:   Rubella:   RPR: Nonreactive (02/04 0000)  HBsAg:    HIV: Non-reactive (02/04 0000)  GBS: Negative (02/04 0000)   Assessment/Plan: 1) IOL at 39.0 weeks.    - Cytotec for cervical rippening  -Epidural if needed when starts to progress  -Anticipate NSVD   Bryan R. Hess, DO of [facility] 04/23/2012, 10:19 PM  I have seen and examined this patient and I agree with the above. Cam Hai 10:22 PM 04/30/2012

## 2012-04-23 NOTE — OB Triage Provider Note (Signed)
Chief Complaint:  Vaginal Bleeding   None     HPI: Ashyah Quizon is a 37 y.o. [id number] pt of [facility] at 55w6dwho presents to maternity admissions reporting one episode of bright red bleeding in the toilet and when she wiped afterwards.  She had a low-lying placenta early in pregnancy which resolved.  She reports good fetal movement, denies regular contractions, LOF, vaginal itching/burning, urinary symptoms, h/a, dizziness, n/v, or fever/chills.     Past Medical History: Past Medical History  Diagnosis Date  . Depression     Past obstetric history: OB History    Grav Para Term Preterm Abortions TAB SAB Ect Mult Living   6 5 5  0 0 0 0 0 0 5     # Outc Date GA Lbr Len/2nd Wgt Sex Del Anes PTL Lv   1 TRM 4/96    F SVD   Yes   2 TRM 7/99    M SVD   Yes   3 TRM 6/00    F SVD   Yes   4 TRM 12/03    F SVD   Yes   5 TRM 9/08    M SVD   Yes   6 CUR               Past Surgical History: Past Surgical History  Procedure Date  . Breast surgery     Family History: No family history on file.  Social History: History  Substance Use Topics  . Smoking status: Never Smoker   . Smokeless tobacco: Not on file  . Alcohol Use: No    Allergies: No Known Allergies  Meds:  Prescriptions prior to admission  Medication Sig Dispense Refill  . Prenatal Vit-Fe Fumarate-FA (PRENATAL COMPLETE) 14-0.4 MG TABS Take 1 tablet by mouth daily.  30 each  0    ROS: Pertinent findings in history of present illness.  Physical Exam  Blood pressure 124/77, pulse 72, temperature 98 F (36.7 C), temperature source Oral, resp. rate 16, last menstrual period 07/27/2011, unknown if currently breastfeeding. GENERAL: Well-developed, well-nourished female in no acute distress.  HEENT: normocephalic HEART: normal rate RESP: normal effort ABDOMEN: Soft, non-tender, gravid appropriate for gestational age EXTREMITIES: Nontender, no edema NEURO: alert and oriented  Dilation: 2.5 Effacement (%):  Thick Cervical Position: Middle Station: -3 Exam by:: Currie Paris ,RN  FHT:  Baseline 145, moderate variability, accelerations present, no decelerations Contractions: occasional   Labs: Results for orders placed during the hospital encounter of 04/23/12 (from the past 24 hour(s))  OB RESULTS CONSOLE GBS     Status: Normal      Component Value Range   GBS Negative    OB RESULTS CONSOLE GC/CHLAMYDIA     Status: Normal      Component Value Range   Gonorrhea Negative     Chlamydia Negative    OB RESULTS CONSOLE RPR     Status: Normal      Component Value Range   RPR Nonreactive    OB RESULTS CONSOLE HIV ANTIBODY (ROUTINE TESTING)     Status: Normal      Component Value Range   HIV Non-reactive      Assessment: 1. Pregnancy with third trimester bleeding     Plan: Discussed assessment and findings with Dr Shawnie Pons Admit for IOL     Medication List     As of 04/23/2012  9:05 PM    ASK your doctor about these medications  Prenatal Complete 14-0.4 MG Tabs   Take 1 tablet by mouth daily.        Sharen Counter Certified Nurse-Midwife 04/23/2012 9:05 PM

## 2012-04-23 NOTE — OB Triage Note (Signed)
Pt reports vaginal bleeding started at 5pm.  Began as a brownish color, then changed to red.

## 2012-04-23 NOTE — Progress Notes (Signed)
fhr tracing in obix on 04/23/12 from 1747-1939 recording on another pt chart (mrn [id number]). Corrected at 1940.

## 2012-04-24 ENCOUNTER — Encounter (HOSPITAL_COMMUNITY): Payer: Self-pay | Admitting: *Deleted

## 2012-04-24 DIAGNOSIS — O09529 Supervision of elderly multigravida, unspecified trimester: Secondary | ICD-10-CM

## 2012-04-24 DIAGNOSIS — O4690 Antepartum hemorrhage, unspecified, unspecified trimester: Secondary | ICD-10-CM

## 2012-04-24 LAB — TYPE AND SCREEN
ABO/RH(D): O POS
Antibody Screen: NEGATIVE

## 2012-04-24 LAB — CBC
Hemoglobin: 10.4 g/dL — ABNORMAL LOW (ref 12.0–15.0)
MCH: 28.7 pg (ref 26.0–34.0)
MCHC: 33.9 g/dL (ref 30.0–36.0)

## 2012-04-24 MED ORDER — DIPHENHYDRAMINE HCL 25 MG PO CAPS: 25.0000 mg | ORAL_CAPSULE | Freq: Four times a day (QID) | ORAL | Status: DC | PRN

## 2012-04-24 MED ORDER — FENTANYL CITRATE 0.05 MG/ML IJ SOLN
100.0000 ug | Freq: Once | INTRAMUSCULAR | Status: AC
  Administered 2012-04-24: 100 ug via INTRAVENOUS
  Filled 2012-04-24: qty 2

## 2012-04-24 MED ORDER — OXYTOCIN 40 UNITS IN LACTATED RINGERS INFUSION - SIMPLE MED: 1.0000 m[IU]/min | INTRAVENOUS | Status: DC

## 2012-04-24 MED ORDER — LANOLIN HYDROUS EX OINT: TOPICAL_OINTMENT | CUTANEOUS | Status: DC | PRN

## 2012-04-24 MED ORDER — SENNOSIDES-DOCUSATE SODIUM 8.6-50 MG PO TABS
2.0000 | ORAL_TABLET | Freq: Every day | ORAL | Status: DC
  Administered 2012-04-24: 2 via ORAL

## 2012-04-24 MED ORDER — ONDANSETRON HCL 4 MG PO TABS: 4.0000 mg | ORAL_TABLET | ORAL | Status: DC | PRN

## 2012-04-24 MED ORDER — TETANUS-DIPHTH-ACELL PERTUSSIS 5-2.5-18.5 LF-MCG/0.5 IM SUSP: 0.5000 mL | Freq: Once | INTRAMUSCULAR | Status: DC

## 2012-04-24 MED ORDER — ONDANSETRON HCL 4 MG/2ML IJ SOLN: 4.0000 mg | INTRAMUSCULAR | Status: DC | PRN

## 2012-04-24 MED ORDER — OXYCODONE-ACETAMINOPHEN 5-325 MG PO TABS
1.0000 | ORAL_TABLET | ORAL | Status: DC | PRN
  Administered 2012-04-24 (×2): 1 via ORAL
  Filled 2012-04-24 (×3): qty 1

## 2012-04-24 MED ORDER — TERBUTALINE SULFATE 1 MG/ML IJ SOLN: 0.2500 mg | Freq: Once | INTRAMUSCULAR | Status: DC | PRN

## 2012-04-24 MED ORDER — IBUPROFEN 600 MG PO TABS
600.0000 mg | ORAL_TABLET | Freq: Four times a day (QID) | ORAL | Status: DC
  Administered 2012-04-24 – 2012-04-25 (×5): 600 mg via ORAL
  Filled 2012-04-24 (×6): qty 1

## 2012-04-24 MED ORDER — BENZOCAINE-MENTHOL 20-0.5 % EX AERO: 1.0000 "application " | INHALATION_SPRAY | CUTANEOUS | Status: DC | PRN

## 2012-04-24 MED ORDER — ZOLPIDEM TARTRATE 5 MG PO TABS: 5.0000 mg | ORAL_TABLET | Freq: Every evening | ORAL | Status: DC | PRN

## 2012-04-24 MED ORDER — PRENATAL MULTIVITAMIN CH
1.0000 | ORAL_TABLET | Freq: Every day | ORAL | Status: DC
  Administered 2012-04-24 – 2012-04-25 (×2): 1 via ORAL
  Filled 2012-04-24 (×2): qty 1

## 2012-04-24 MED ORDER — SIMETHICONE 80 MG PO CHEW: 80.0000 mg | CHEWABLE_TABLET | ORAL | Status: DC | PRN

## 2012-04-24 MED ORDER — WITCH HAZEL-GLYCERIN EX PADS: 1.0000 "application " | MEDICATED_PAD | CUTANEOUS | Status: DC | PRN

## 2012-04-24 MED ORDER — DIBUCAINE 1 % RE OINT: 1.0000 "application " | TOPICAL_OINTMENT | RECTAL | Status: DC | PRN

## 2012-04-24 MED ORDER — FENTANYL CITRATE 0.05 MG/ML IJ SOLN: 100.0000 ug | INTRAMUSCULAR | Status: DC | PRN

## 2012-04-24 NOTE — Progress Notes (Signed)
Patient was referred for history of depression/anxiety.  * Referral screened out by Clinical Social Worker because none of the following criteria appear to apply:  ~ History of anxiety/depression during this pregnancy, or of post-partum depression.  ~ Diagnosis of anxiety and/or depression within last 3 years  ~ History of depression due to pregnancy loss/loss of child  OR  * Patient's symptoms currently being treated with medication and/or therapy.  Please contact the Clinical Social Worker if needs arise, or by the patient's request.  Pt denies recent depression, as noted in chart.

## 2012-04-24 NOTE — Progress Notes (Signed)
Subjective: Pt doing well, having some increased abdominal pressure.  Would like epidural   Objective: BP 119/94  Pulse 73  Temp 98.1 F (36.7 C) (Oral)  Resp 18  LMP 07/27/2011  Breastfeeding? Unknown      FHT:  FHR: 140 bpm, variability: moderate,  accelerations:  Present,  decelerations:  Absent UC:   irregular, every 5 minutes SVE:   Dilation: 2 Effacement (%): Thick Station: -3 Exam by:: dr. Paulina Fusi  Labs: Lab Results  Component Value Date   WBC 8.8 04/23/2012   HGB 11.4* 04/23/2012   HCT 34.2* 04/23/2012   MCV 85.1 04/23/2012   PLT 138* 04/23/2012    Assessment / Plan: IOL due to vaginal bleeding, Will start Pitocin   Labor: Progressing normally Fetal Wellbeing:  Category I Pain Control:  Fentanyl, Will be getting Epidural  I/D:  n/a Anticipated MOD:  NSVD  Sylvester Salonga R. Ragena Fiola, DO of [facility] 04/24/2012, 1:02 AM

## 2012-04-24 NOTE — Progress Notes (Signed)
UR chart review completed.  

## 2012-04-24 NOTE — OB Triage Provider Note (Signed)
Chart reviewed and agree with management and plan.  

## 2012-04-25 MED ORDER — BENZOCAINE-MENTHOL 20-0.5 % EX AERO: 1.0000 "application " | INHALATION_SPRAY | CUTANEOUS | Status: DC | PRN

## 2012-04-25 MED ORDER — IBUPROFEN 600 MG PO TABS: 600.0000 mg | ORAL_TABLET | Freq: Four times a day (QID) | ORAL | Status: DC

## 2012-04-25 NOTE — Discharge Summary (Signed)
[name] is a 37 y.o. year old G61P5005 female at [redacted]w[redacted]d weeks gestation by LMP who presented for IOL for vaginal bleeding.  Pt received epidural and delivered a viable female infant at 2:35 AM on 05/04/12 vis NSVD, ROA, Apgars 9,9.  Placenta delivered intact and spontaneously and 3 vessel cord present.  No episiotomy or laceration, EBL 250 cc.  Pt will be breast and bottle feeding and wants IUD.    Obstetric Discharge Summary Reason for Admission: induction of labor Prenatal Procedures: none Intrapartum Procedures: spontaneous vaginal delivery Postpartum Procedures: none Complications-Operative and Postpartum: none Hemoglobin  Date Value Range Status  04/24/2012 10.4* 12.0 - 15.0 g/dL Final     HCT  Date Value Range Status  04/24/2012 30.7* 36.0 - 46.0 % Final    Physical Exam:  General: alert, cooperative and appears stated age 31: appropriate Uterine Fundus: firm DVT Evaluation: No evidence of DVT seen on physical exam.  Discharge Diagnoses: Term Pregnancy-delivered  Discharge Information: Date: 04/25/2012 Activity: pelvic rest Diet: routine Medications: Ibuprofen Condition: stable Instructions: refer to practice specific booklet Discharge to: home   Newborn Data: Live born female  Birth Weight: 8 lb 8 oz (3856 g) APGAR: 9, 9  Home with mother.  Twana First Hess, DO of  [facility] 04/25/2012, 7:51 AM   I have seen this patient and agree with the above resident's note.  LEFTWICH-KIRBY, Natali Lavallee Certified Nurse-Midwife

## 2012-05-01 NOTE — H&P (Signed)
Chart reviewed and agree with management and plan.  

## 2012-06-02 ENCOUNTER — Encounter (HOSPITAL_COMMUNITY): Payer: Self-pay | Admitting: Physical Medicine and Rehabilitation

## 2012-06-02 ENCOUNTER — Emergency Department (HOSPITAL_COMMUNITY)
Admission: EM | Admit: 2012-06-02 | Discharge: 2012-06-02 | Disposition: A | Discharge status: Home / Self Care | Payer: Self-pay | Attending: Emergency Medicine | Admitting: Emergency Medicine

## 2012-06-02 DIAGNOSIS — Y929 Unspecified place or not applicable: Secondary | ICD-10-CM | POA: Insufficient documentation

## 2012-06-02 DIAGNOSIS — S63502A Unspecified sprain of left wrist, initial encounter: Secondary | ICD-10-CM

## 2012-06-02 DIAGNOSIS — X58XXXA Exposure to other specified factors, initial encounter: Secondary | ICD-10-CM | POA: Insufficient documentation

## 2012-06-02 DIAGNOSIS — S63509A Unspecified sprain of unspecified wrist, initial encounter: Secondary | ICD-10-CM | POA: Insufficient documentation

## 2012-06-02 DIAGNOSIS — Y939 Activity, unspecified: Secondary | ICD-10-CM | POA: Insufficient documentation

## 2012-06-02 DIAGNOSIS — Z8659 Personal history of other mental and behavioral disorders: Secondary | ICD-10-CM | POA: Insufficient documentation

## 2012-06-02 MED ORDER — IBUPROFEN 800 MG PO TABS: 800.0000 mg | ORAL_TABLET | Freq: Three times a day (TID) | ORAL | Status: DC

## 2012-06-02 NOTE — ED Provider Notes (Signed)
History    This chart was scribed for non-physician practitioner, Arnoldo Hooker, PA-C, working with Hurman Horn, MD by Melba Coon, ED Scribe. This patient was seen in room TR10C/TR10C and the patient's care was started at 6:01PM.   CSN: [id number]  Arrival date & time 06/02/12  1647   First MD Initiated Contact with Patient 06/02/12 1725      Chief Complaint  Patient presents with  . Wrist Pain    (Consider location/radiation/quality/duration/timing/severity/associated sxs/prior treatment) The history is provided by the patient. A language interpreter was used (daughter).   [name] is a 37 y.o. female who presents to the Emergency Department complaining of constant, moderate to severe, burning, left wrist pain with a gradual onset 2 weeks ago. She denies any mechanism of injury. Movement of the wrist (flexion, extension, twisting, etc), applying pressure, and picking things up (moving her thumb) aggravates the pain. She reports she had a "ball" at her joint earlier around onset that is no longer present. Denies HA, fever, neck pain, sore throat, rash, back pain, CP, SOB, abdominal pain, nausea, emesis, diarrhea, dysuria, or extremity edema, weakness, numbness, or tingling. No known allergies. No other pertinent medical symptoms. She is right handed. She just delivered a baby who is 65 week old and she is currently breast feeding.  Past Medical History  Diagnosis Date  . Depression     Past Surgical History  Procedure Laterality Date  . Breast surgery  biopsy    No family history on file.  History  Substance Use Topics  . Smoking status: Never Smoker   . Smokeless tobacco: Not on file  . Alcohol Use: No    OB History   Grav Para Term Preterm Abortions TAB SAB Ect Mult Living   6 6 6  0 0 0 0 0 0 6      Review of Systems 10 Systems reviewed and all are negative for acute change except as noted in the HPI.   Allergies  Review of patient's  allergies indicates no known allergies.  Home Medications   Current Outpatient Rx  Name  Route  Sig  Dispense  Refill  . Prenatal Vit-Fe Fumarate-FA (PRENATAL COMPLETE) 14-0.4 MG TABS   Oral   Take 1 tablet by mouth daily.   30 each   0     BP 108/47  Pulse 75  Temp(Src) 98.3 F (36.8 C) (Oral)  Resp 16  SpO2 97%  Physical Exam  Nursing note and vitals reviewed. Constitutional: She is oriented to person, place, and time. She appears well-developed and well-nourished. No distress.  HENT:  Head: Normocephalic and atraumatic.  Eyes: EOM are normal.  Neck: Neck supple. No tracheal deviation present.  Cardiovascular: Normal rate.   Pulmonary/Chest: Effort normal. No respiratory distress.  Musculoskeletal: Normal range of motion. She exhibits tenderness. She exhibits no edema.  Left radial tenderness without swelling, mass, or erythema with full ROM of the left wrist with pain involving extreme flexion and extension.  Neurological: She is alert and oriented to person, place, and time.  Skin: Skin is warm and dry.  Psychiatric: She has a normal mood and affect. Her behavior is normal.    ED Course  Procedures (including critical care time)  DIAGNOSTIC STUDIES: Oxygen Saturation is 97% on room air, normal by my interpretation.    COORDINATION OF CARE:  6:05PM - splint will be ordered for [name]. Ibuprofen will be prescribed. She will be referred to a hand specialist and is  advised to apply ice at home to the wrist. She is ready for d/c.  No results found.  Labs Reviewed - No data to display No results found.   No diagnosis found.   1. Mild wrist sprain  MDM  Uncomplicated wrist sprain  I personally performed the services described in this documentation, which was scribed in my presence. The recorded information has been reviewed and is accurate.          Arnoldo Hooker, PA-C 06/04/12 0021

## 2012-06-02 NOTE — ED Notes (Signed)
Pt presents to department for evaluation of L wrist pain. Ongoing for several days. States her wrist is "burning," pain becomes worse with movement. 8/10 pain at the time. Pt is alert and oriented x4.

## 2012-06-02 NOTE — Progress Notes (Signed)
Orthopedic Tech Progress Note Patient Details:  [name] 03-Sep-1975 [id number]  Ortho Devices Type of Ortho Device: Velcro wrist splint Ortho Device/Splint Location: left wrist Ortho Device/Splint Interventions: Application   Nikki Dom 06/02/2012, 6:35 PM

## 2012-06-04 NOTE — ED Provider Notes (Signed)
Medical screening examination/treatment/procedure(s) were performed by non-physician practitioner and as supervising physician I was immediately available for consultation/collaboration.   Madylyn Insco M Laray Rivkin, MD 06/04/12 1725 

## 2013-05-12 ENCOUNTER — Emergency Department (INDEPENDENT_AMBULATORY_CARE_PROVIDER_SITE_OTHER)
Admission: EM | Admit: 2013-05-12 | Discharge: 2013-05-12 | Disposition: A | Discharge status: Home / Self Care | Payer: Self-pay | Source: Home / Self Care | Attending: Emergency Medicine | Admitting: Emergency Medicine

## 2013-05-12 ENCOUNTER — Encounter (HOSPITAL_COMMUNITY): Payer: Self-pay | Admitting: Emergency Medicine

## 2013-05-12 DIAGNOSIS — R109 Unspecified abdominal pain: Secondary | ICD-10-CM

## 2013-05-12 DIAGNOSIS — S8000XA Contusion of unspecified knee, initial encounter: Secondary | ICD-10-CM

## 2013-05-12 DIAGNOSIS — E669 Obesity, unspecified: Secondary | ICD-10-CM

## 2013-05-12 DIAGNOSIS — R1032 Left lower quadrant pain: Secondary | ICD-10-CM

## 2013-05-12 DIAGNOSIS — S8001XA Contusion of right knee, initial encounter: Secondary | ICD-10-CM

## 2013-05-12 DIAGNOSIS — S8002XA Contusion of left knee, initial encounter: Secondary | ICD-10-CM

## 2013-05-12 NOTE — ED Provider Notes (Signed)
CSN: [id number]     Arrival date & time 05/12/13  [id number] History   None    Chief Complaint  Patient presents with  . Knee Pain  . Groin Pain  . Fall     (Consider location/radiation/quality/duration/timing/severity/associated sxs/prior Treatment) HPI Comments: Patient presents to report that she fell two weeks ago, landing on both of her knees and that she still has some knee discomfort with ambulation only. Denies pain at rest, deformity, STS, ecchymosis or joint swelling. Also states that since her fall, she has occasional left groin and pelvic discomfort with standing or ambulation. States these symptoms resolve when she lies in the supine position. Denies GI or GU symptoms.  At the time of today's exam, she states she is not currently have knee or groin pain.   The history is provided by the patient. The history is limited by a language barrier. A language interpreter was used.    Past Medical History  Diagnosis Date  . Depression    Past Surgical History  Procedure Laterality Date  . Breast surgery  biopsy   No family history on file. History  Substance Use Topics  . Smoking status: Never Smoker   . Smokeless tobacco: Not on file  . Alcohol Use: No   OB History   Grav Para Term Preterm Abortions TAB SAB Ect Mult Living   6 6 6  0 0 0 0 0 0 6     Review of Systems  All other systems reviewed and are negative.      Allergies  Review of patient's allergies indicates no known allergies.  Home Medications   Current Outpatient Rx  Name  Route  Sig  Dispense  Refill  . ibuprofen (ADVIL,MOTRIN) 800 MG tablet   Oral   Take 1 tablet (800 mg total) by mouth 3 (three) times daily.   21 tablet   0   . Prenatal Vit-Fe Fumarate-FA (PRENATAL COMPLETE) 14-0.4 MG TABS   Oral   Take 1 tablet by mouth daily.   30 each   0    BP 112/73  Pulse 76  Temp(Src) 98.3 F (36.8 C) (Oral)  Resp 20  SpO2 98%  Breastfeeding? Yes Physical Exam  Nursing note and vitals  reviewed. Constitutional: She is oriented to person, place, and time. She appears well-developed and well-nourished. No distress.  +morbidly obese   HENT:  Head: Normocephalic and atraumatic.  Eyes: Conjunctivae are normal. No scleral icterus.  Cardiovascular: Normal rate, regular rhythm and normal heart sounds.   Pulmonary/Chest: Effort normal and breath sounds normal.  Abdominal: Soft. Bowel sounds are normal. She exhibits no distension and no mass. There is no splenomegaly or hepatomegaly. There is no tenderness. There is no rebound and no guarding. No hernia. Hernia confirmed negative in the ventral area, confirmed negative in the right inguinal area and confirmed negative in the left inguinal area.    +large abdominal panus. Exam somewhat limited by obesity  Musculoskeletal: Normal range of motion.       Right knee: Normal.       Left knee: Normal.  Neurological: She is alert and oriented to person, place, and time.  Skin: Skin is warm and dry. No rash noted.  Psychiatric: She has a normal mood and affect. Her behavior is normal.    ED Course  Procedures (including critical care time) Labs Review Labs Reviewed - No data to display Imaging Review No results found.    MDM   Final diagnoses:  Obesity  Contusion of right knee  Contusion of left knee  Left groin pain  The examination of knees was normal. would expect that your symptoms would begin to improve over the next 1-2 weeks. Pt. may use either tylenol or advil as directed on packaging for discomfort. If knees continue to bother when walking,  would like patient to strongly consider losing some weight.  For the discomfort in left groin and abdomen, this is likely a muscle strain from recent fall. However, if it persists or does not improve when patient lies flat, please contact the clinic listed on your discahrge paperwork to arrange to be seen and they can determine if imaging studies needed to determine if abdominal  hernia present.   Jess Barters [place], [place] 05/12/13 1137

## 2013-05-12 NOTE — ED Notes (Signed)
Discharged instructions delayed waiting for available translator phone.

## 2013-05-12 NOTE — Discharge Instructions (Signed)
The examination of your knees was normal. I would expect that your symptoms would begin to improve over the next 1-2 weeks. You may use either tylenol or advil as directed on packaging for discomfort. If you knees continue to bother you when you are walking, I would like you to strongly consider losing some weight.  For the discomfort in your left groin and abdomen, this is likely a muscle strain from your recent fall. However, if it persists or does not improve when you lie flat, please contact the clinic listed on your discahrge paperwork to arrange to be seen and they can determine if you will need special studies to determine if you have an abdominal hernia.  You should also contact the clinic listed on your paperwork so that they may become your primary care providers.

## 2013-05-12 NOTE — ED Notes (Signed)
Patient fell to her knees approx 2 weeks ago, in response to extreme emotional situation.  Continues to have pain in knees and left groin area

## 2013-05-12 NOTE — ED Notes (Signed)
Discharge pending translater phone availability

## 2013-05-13 NOTE — ED Provider Notes (Signed)
Medical screening examination/treatment/procedure(s) were performed by non-physician practitioner and as supervising physician I was immediately available for consultation/collaboration.  Leslee Homeavid Langley Ingalls, M.D.  Reuben Likesavid C Seniya Stoffers, MD 05/13/13 [phone]

## 2014-01-19 ENCOUNTER — Encounter (HOSPITAL_COMMUNITY): Payer: Self-pay | Admitting: Emergency Medicine

## 2015-08-28 ENCOUNTER — Emergency Department (HOSPITAL_COMMUNITY): Payer: Medicaid Other

## 2015-08-28 ENCOUNTER — Inpatient Hospital Stay (HOSPITAL_COMMUNITY)
Admission: EM | Admit: 2015-08-28 | Discharge: 2015-09-22 | DRG: 799 | Disposition: A | Discharge status: Other Acute Inpatient Hospital | Payer: Medicaid Other | Attending: Internal Medicine | Admitting: Internal Medicine

## 2015-08-28 ENCOUNTER — Encounter (HOSPITAL_COMMUNITY): Payer: Self-pay | Admitting: *Deleted

## 2015-08-28 DIAGNOSIS — B181 Chronic viral hepatitis B without delta-agent: Secondary | ICD-10-CM

## 2015-08-28 DIAGNOSIS — D693 Immune thrombocytopenic purpura: Secondary | ICD-10-CM | POA: Diagnosis present

## 2015-08-28 DIAGNOSIS — I609 Nontraumatic subarachnoid hemorrhage, unspecified: Secondary | ICD-10-CM | POA: Diagnosis present

## 2015-08-28 DIAGNOSIS — Z452 Encounter for adjustment and management of vascular access device: Secondary | ICD-10-CM

## 2015-08-28 DIAGNOSIS — M545 Low back pain: Secondary | ICD-10-CM | POA: Diagnosis present

## 2015-08-28 DIAGNOSIS — L89321 Pressure ulcer of left buttock, stage 1: Secondary | ICD-10-CM | POA: Diagnosis not present

## 2015-08-28 DIAGNOSIS — R509 Fever, unspecified: Secondary | ICD-10-CM

## 2015-08-28 DIAGNOSIS — D62 Acute posthemorrhagic anemia: Secondary | ICD-10-CM | POA: Diagnosis not present

## 2015-08-28 DIAGNOSIS — N920 Excessive and frequent menstruation with regular cycle: Secondary | ICD-10-CM

## 2015-08-28 DIAGNOSIS — D6959 Other secondary thrombocytopenia: Secondary | ICD-10-CM | POA: Diagnosis present

## 2015-08-28 DIAGNOSIS — G936 Cerebral edema: Secondary | ICD-10-CM | POA: Diagnosis not present

## 2015-08-28 DIAGNOSIS — R519 Headache, unspecified: Secondary | ICD-10-CM

## 2015-08-28 DIAGNOSIS — F329 Major depressive disorder, single episode, unspecified: Secondary | ICD-10-CM | POA: Diagnosis present

## 2015-08-28 DIAGNOSIS — H538 Other visual disturbances: Secondary | ICD-10-CM | POA: Diagnosis present

## 2015-08-28 DIAGNOSIS — N939 Abnormal uterine and vaginal bleeding, unspecified: Secondary | ICD-10-CM | POA: Diagnosis present

## 2015-08-28 DIAGNOSIS — D696 Thrombocytopenia, unspecified: Secondary | ICD-10-CM

## 2015-08-28 DIAGNOSIS — R040 Epistaxis: Secondary | ICD-10-CM | POA: Diagnosis present

## 2015-08-28 DIAGNOSIS — H9319 Tinnitus, unspecified ear: Secondary | ICD-10-CM | POA: Diagnosis present

## 2015-08-28 DIAGNOSIS — R319 Hematuria, unspecified: Secondary | ICD-10-CM | POA: Diagnosis present

## 2015-08-28 DIAGNOSIS — I959 Hypotension, unspecified: Secondary | ICD-10-CM | POA: Diagnosis not present

## 2015-08-28 DIAGNOSIS — Z6841 Body Mass Index (BMI) 40.0 and over, adult: Secondary | ICD-10-CM | POA: Diagnosis not present

## 2015-08-28 DIAGNOSIS — K068 Other specified disorders of gingiva and edentulous alveolar ridge: Secondary | ICD-10-CM

## 2015-08-28 DIAGNOSIS — J9811 Atelectasis: Secondary | ICD-10-CM | POA: Diagnosis not present

## 2015-08-28 DIAGNOSIS — R739 Hyperglycemia, unspecified: Secondary | ICD-10-CM | POA: Diagnosis present

## 2015-08-28 DIAGNOSIS — T380X5A Adverse effect of glucocorticoids and synthetic analogues, initial encounter: Secondary | ICD-10-CM | POA: Diagnosis present

## 2015-08-28 DIAGNOSIS — K0889 Other specified disorders of teeth and supporting structures: Secondary | ICD-10-CM

## 2015-08-28 DIAGNOSIS — L8990 Pressure ulcer of unspecified site, unspecified stage: Secondary | ICD-10-CM | POA: Insufficient documentation

## 2015-08-28 DIAGNOSIS — I619 Nontraumatic intracerebral hemorrhage, unspecified: Secondary | ICD-10-CM

## 2015-08-28 DIAGNOSIS — K5900 Constipation, unspecified: Secondary | ICD-10-CM | POA: Diagnosis not present

## 2015-08-28 DIAGNOSIS — R51 Headache: Secondary | ICD-10-CM

## 2015-08-28 DIAGNOSIS — R339 Retention of urine, unspecified: Secondary | ICD-10-CM | POA: Diagnosis not present

## 2015-08-28 LAB — CBC WITH DIFFERENTIAL/PLATELET
Basophils Absolute: 0 10*3/uL (ref 0.0–0.1)
Basophils Relative: 0 %
EOS ABS: 0.1 10*3/uL (ref 0.0–0.7)
Eosinophils Relative: 2 %
HEMATOCRIT: 37.1 % (ref 36.0–46.0)
HEMOGLOBIN: 12.1 g/dL (ref 12.0–15.0)
LYMPHS ABS: 1.4 10*3/uL (ref 0.7–4.0)
LYMPHS PCT: 24 %
MCH: 27.7 pg (ref 26.0–34.0)
MCHC: 32.6 g/dL (ref 30.0–36.0)
MCV: 84.9 fL (ref 78.0–100.0)
Monocytes Absolute: 0.4 10*3/uL (ref 0.1–1.0)
Monocytes Relative: 8 %
NEUTROS ABS: 3.9 10*3/uL (ref 1.7–7.7)
NEUTROS PCT: 66 %
Platelets: <5 10*3/uL — CL (ref 150–400)
RBC: 4.37 MIL/uL (ref 3.87–5.11)
RDW: 13.6 % (ref 11.5–15.5)
WBC: 5.8 10*3/uL (ref 4.0–10.5)

## 2015-08-28 LAB — DIC (DISSEMINATED INTRAVASCULAR COAGULATION)PANEL
INR: 1.21 (ref 0.00–1.49)
Prothrombin Time: 15.5 seconds — ABNORMAL HIGH (ref 11.6–15.2)
aPTT: 39 seconds — ABNORMAL HIGH (ref 24–37)

## 2015-08-28 LAB — PREPARE RBC (CROSSMATCH)

## 2015-08-28 LAB — COMPREHENSIVE METABOLIC PANEL
ALT: 21 U/L (ref 14–54)
ANION GAP: 5 (ref 5–15)
AST: 20 U/L (ref 15–41)
Albumin: 3.9 g/dL (ref 3.5–5.0)
Alkaline Phosphatase: 47 U/L (ref 38–126)
BUN: 12 mg/dL (ref 6–20)
CHLORIDE: 107 mmol/L (ref 101–111)
CO2: 27 mmol/L (ref 22–32)
Calcium: 9.2 mg/dL (ref 8.9–10.3)
Creatinine, Ser: 0.62 mg/dL (ref 0.44–1.00)
Glucose, Bld: 100 mg/dL — ABNORMAL HIGH (ref 65–99)
POTASSIUM: 3.6 mmol/L (ref 3.5–5.1)
SODIUM: 139 mmol/L (ref 135–145)
Total Bilirubin: 0.7 mg/dL (ref 0.3–1.2)
Total Protein: 7 g/dL (ref 6.5–8.1)

## 2015-08-28 LAB — MRSA PCR SCREENING: MRSA by PCR: NEGATIVE

## 2015-08-28 LAB — DIC (DISSEMINATED INTRAVASCULAR COAGULATION) PANEL
FIBRINOGEN: 351 mg/dL (ref 204–475)
SMEAR REVIEW: NONE SEEN

## 2015-08-28 LAB — PREGNANCY, URINE: Preg Test, Ur: NEGATIVE

## 2015-08-28 LAB — RETICULOCYTES
RBC.: 4.24 MIL/uL (ref 3.87–5.11)
RETIC COUNT ABSOLUTE: 50.9 10*3/uL (ref 19.0–186.0)
Retic Ct Pct: 1.2 % (ref 0.4–3.1)

## 2015-08-28 LAB — LACTATE DEHYDROGENASE: LDH: 185 U/L (ref 98–192)

## 2015-08-28 MED ORDER — DEXAMETHASONE 4 MG PO TABS
40.0000 mg | ORAL_TABLET | Freq: Once | ORAL | Status: AC
  Administered 2015-08-28: 40 mg via ORAL
  Filled 2015-08-28: qty 10

## 2015-08-28 MED ORDER — IMMUNE GLOBULIN (HUMAN) 10 GM/100ML IV SOLN
100.0000 g | INTRAVENOUS | Status: AC
  Administered 2015-08-28 – 2015-08-29 (×2): 100 g via INTRAVENOUS
  Filled 2015-08-28 (×3): qty 1000

## 2015-08-28 MED ORDER — IMMUNE GLOBULIN (HUMAN) 10 GM/100ML IV SOLN: 1.0000 g/kg | INTRAVENOUS | Status: DC

## 2015-08-28 MED ORDER — SODIUM CHLORIDE 0.9 % IV SOLN: Freq: Once | INTRAVENOUS | Status: DC

## 2015-08-28 MED ORDER — SODIUM CHLORIDE 0.9 % IV SOLN
Freq: Once | INTRAVENOUS | Status: AC
  Administered 2015-08-28: 14:00:00 via INTRAVENOUS

## 2015-08-28 MED ORDER — ACETAMINOPHEN 325 MG PO TABS
650.0000 mg | ORAL_TABLET | Freq: Four times a day (QID) | ORAL | Status: DC | PRN
  Administered 2015-08-29 – 2015-09-20 (×16): 650 mg via ORAL
  Filled 2015-08-28 (×17): qty 2

## 2015-08-28 NOTE — Progress Notes (Signed)
Pt is currently breastfeeding. Clarified with pt and she states that it is not for nourishment but for comfort. When asked if she needed a pump for the duration of her hospital stay she said no. Pt stated that she would not become engourged nor would it deplete her supply. Pt was informed that she could change her mind at any point if she decided she'd like to pump.

## 2015-08-28 NOTE — H&P (Signed)
Date: 08/28/2015               Patient Name:  [name] MRN: [id number]  DOB: [birth year] Age / Sex: 40 y.o., female   PCP: No Pcp Per Patient         Medical Service: Internal Medicine Teaching Service         Attending Physician: Dr. Inez Catalina, MD    First Contact: MS4 Danelle Earthly Pager: [phone]  Second Contact: Dr. Gara Kroner Pager: [phone]       After Hours (After 5p/  First Contact Pager: [phone]  weekends / holidays): Second Contact Pager: [phone]   Chief Complaint: bleeding from gums  History of Present Illness: 46F with no significant PMHx who presents with uncontrolled gingival bleeding that started this morning. She woke up this morning around 7:00am b/c she felt that her neck was wet and found that she had profuse bleeding that was coming from her mouth. She then went to the ED and while in the waiting room developed a diffuse petechial rash. Her platelet count was found to be zero. She had a head CT due to complaints of a HA which found probable mild regions of subarachnoid hemorrhage in posterior parietal lobes bilaterally without complicating features. She has 6 children that were all vaginal deliveries w/o complication. She has not had any recent dental work but had a dental cleaning done in [place] a long time ago with recommendations for a tooth cap that she never got. Her last menstrual period was 6 days ago that was light at first but then got heavy which is unusual for her. She also reports that since October she would pass blood clots when urinating. She does not take any medications, denies recent travel, family hx of bleeding disorders, rashes before admission, fevers, night sweats, chills, and cough. She reports starting a new diet shake called herbal life that she bought from her gym 2-3 months ago, otherwise she has not taken anything new. She has recently lost 8 lbs that was intentional in the last month. She does not think she is pregnant.  Her toddler jumped on her back 3 weeks ago and reports back pain located over her paraspinal muscles on the right side around T4.   In the ED she was started on one of four units platelet transfusion and given dexamethasone 40mg . She was immediately transferred to SDU to start IVIG transfusion.  Meds: Current Facility-Administered Medications  Medication Dose Route Frequency Provider Last Rate Last Dose  . Immune Globulin 10% (OCTAGAM) IV infusion 100 g  100 g Intravenous Q24 Hr x 2 Joanna Puff, MD        Allergies: Allergies as of 08/28/2015  . (No Known Allergies)   Past Medical History  Diagnosis Date  . Depression    Past Surgical History  Procedure Laterality Date  . Breast surgery  biopsy   History reviewed. No pertinent family history. Social History   Social History  . Marital Status: Married    Spouse Name: N/A  . Number of Children: N/A  . Years of Education: N/A   Occupational History  . Not on file.   Social History Main Topics  . Smoking status: Never Smoker   . Smokeless tobacco: Not on file  . Alcohol Use: No  . Drug Use: No  . Sexual Activity: Not on file   Other Topics Concern  . Not on file   Social History Narrative    Review of  Systems: Review of Systems  Constitutional: Negative for fever, chills and weight loss.  Respiratory: Negative for cough.   Cardiovascular: Negative for chest pain.  Gastrointestinal: Negative for abdominal pain.  Genitourinary:       Passes blood clots when urinating   Musculoskeletal: Positive for back pain.  Skin: Positive for rash.  Neurological: Positive for headaches.    Physical Exam: Blood pressure 100/71, pulse 58, temperature 97.8 F (36.6 C), temperature source Axillary, resp. rate 14, height  (1.549 m), weight 227 lb 1.2 oz (103 kg), last menstrual period 08/23/2015, SpO2 99 %, currently breastfeeding. Physical Exam  Constitutional: She appears well-developed and well-nourished. No  distress.  HENT:  Head: Normocephalic and atraumatic.  Nose: Nose normal.  Blood oozing from gums, poor dentition.   Eyes: Conjunctivae and EOM are normal. No scleral icterus.  Cardiovascular: Normal rate and regular rhythm.  Exam reveals no gallop and no friction rub.   No murmur heard. Pulmonary/Chest: Effort normal and breath sounds normal. No respiratory distress. She has no wheezes. She has no rales.  Abdominal: Soft. Bowel sounds are normal. She exhibits no distension and no mass. There is no tenderness. There is no rebound and no guarding.  Obese   Musculoskeletal: She exhibits no edema.  Neurological: No cranial nerve deficit. She exhibits normal muscle tone. Coordination normal.  Able to do heel to shin b/l w/o difficulty, 5/5 LE strength b/l, 5/5 handgrip b/l  Skin: Rash (diffuse spread out pinpoint pink/purple 1-66mm spots on abdomen, chest, and legs. ) noted. She is not diaphoretic.    Lab results: Basic Metabolic Panel:  Recent Labs  16/10/96 1245  NA 139  K 3.6  CL 107  CO2 27  GLUCOSE 100*  BUN 12  CREATININE 0.62  CALCIUM 9.2   Liver Function Tests:  Recent Labs  08/28/15 1245  AST 20  ALT 21  ALKPHOS 47  BILITOT 0.7  PROT 7.0  ALBUMIN 3.9   CBC:  Recent Labs  08/28/15 1245  WBC 5.8  NEUTROABS 3.9  HGB 12.1  HCT 37.1  MCV 84.9  PLT <5*  <5*   D-Dimer:  Recent Labs  08/28/15 1245  DDIMER <0.27   Coagulation:  Recent Labs  08/28/15 1245  LABPROT 15.5*  INR 1.21     Imaging results:  Ct Head Wo Contrast  08/28/2015  CLINICAL DATA:  bruising easily recently, c/o dizziness EXAM: CT HEAD WITHOUT CONTRAST TECHNIQUE: Contiguous axial images were obtained from the base of the skull through the vertex without intravenous contrast. COMPARISON:  None. FINDINGS: Brain: Curvilinear high attenuation in sulci of the posterior parietal lobes bilaterally, right greater than left. No associated parenchymal edema or mass effect. No evidence of  acute infarction, extra-axial collection, ventriculomegaly, or mass effect. Vascular: No hyperdense vessel or unexpected calcification. Skull: Negative for fracture or focal lesion. Sinuses/Orbits: No acute findings. Other: None. IMPRESSION: 1. Probable mild regions of subarachnoid hemorrhage in posterior parietal lobes bilaterally, right greater than left, without complicating features. Critical Value/emergent results were called by telephone at the time of interpretation on 08/28/2015 at 2:05 pm to Dr. Lyndal Pulley , who verbally acknowledged these results. Electronically Signed   By: Corlis Leak M.D.   On: 08/28/2015 14:05       Assessment & Plan by Problem: Active Problems:   Acute ITP (HCC)  Severe thrombocytopenia--  Isolated thrombocytopenia make a bone marrow disorder unlikely. There was no palpable spleen, although patient is obese which limited the exam. Possible  that she has sequestration of platelets in the spleen. Blood smear negative for schistocytes. LFTs are unremarkable. She does not have any symptoms that would suggest an infection. DIC panel with mildly elevated PT at 15.5 and aPTT of 39. Fibrinogen wnl. Cannot rule out medication induced thrombocytopenia with recent use of herbal diet shake although ITP is most likely in this setting.  - admit to SDU - continue IVIG, follow platelets, usually IVIG will increase plts within 24-48 hours if this is ITP  - continue 4 units of platelet transfusion in setting of active bleeding - hemo/onc consulted, appreciate their recommendations - checking urine pregnancy test, HIV, LDH, haptoglobin, and retic count - neuro checks q4h - bed rest, fall precaution - type screened for 2 units of RBC, 2 peripheral IV lines in place    Dispo: Disposition is deferred at this time, awaiting improvement of current medical problems.   The patient does have a current PCP (No Pcp Per Patient) and does not need an [facility] hospital follow-up appointment after  discharge.  The patient does not have transportation limitations that hinder transportation to clinic appointments.  Signed: Denton Brickiana M Zymire Turnbo, MD 08/28/2015, 4:02 PM

## 2015-08-28 NOTE — ED Notes (Signed)
Platelet level <5 per lab, Dr. Clydene PughKnott and resident notified.

## 2015-08-28 NOTE — ED Provider Notes (Signed)
CSN: [id number]     Arrival date & time 08/28/15  1111 History   First MD Initiated Contact with Patient 08/28/15 1207     Chief Complaint  Patient presents with  . Mouth Injury   Utilized [employer] interpreter for this encounter in its entirety.   (Consider location/radiation/quality/duration/timing/severity/associated sxs/prior Treatment)   HPI [name] is a 40 y/o female presenting After waking up with blood in her mouth.  At 7:59am she noticed she was bleeding from her mouth. She is bleeding from her gums. The bleeding has gotten worse since that time. She denies swallowing or choking on the blood, but notes it is a constant ooze from her gums.  No h/o gingival bleeding in the past.  Since waiting in the ED, she has noticed tiny red dots all over he body. She's also noticed that her left arm is bruised from where she lightly bumped into the doorframe this morning.   She denies any recent trauma. She denies taking aspirin, ibuprofen, BC powders, other blood thinners.  She has not had any recent illnesses. She denies taking any medications in the last 1-2 weeks.  No issues with bleeding or easy bruising in the past.  She was on her period for 3 days, it went away, then it returned yesterday. She got the IUD in Oct 2017 and has been consistently spotting since that time. She is not having heavy bleeding. No hematochezia, melana, or hematuria. No abdominal pain, headache, blurred vision, confusion, paresthesias, or weakness noticed. No chest pain, SOB, fatigue, dizziness, light headedness.   Past Medical History  Diagnosis Date  . Depression    Past Surgical History  Procedure Laterality Date  . Breast surgery  biopsy   History reviewed. No pertinent family history. Social History  Substance Use Topics  . Smoking status: Never Smoker   . Smokeless tobacco: None  . Alcohol Use: No   OB History    Gravida Para Term Preterm AB TAB SAB Ectopic Multiple Living   6 6 6   0 0 0 0 0 0 6     Review of Systems  Constitutional: Negative for fever, chills, diaphoresis, activity change, appetite change and fatigue.  HENT: Positive for dental problem. Negative for congestion, drooling, ear discharge, ear pain, facial swelling, mouth sores, nosebleeds, postnasal drip, rhinorrhea, sinus pressure, sneezing, sore throat and trouble swallowing.   Eyes: Negative for photophobia, pain, discharge, redness, itching and visual disturbance.  Respiratory: Negative for apnea, cough, choking, chest tightness, shortness of breath, wheezing and stridor.   Cardiovascular: Negative for chest pain, palpitations and leg swelling.  Gastrointestinal: Negative for nausea, vomiting, abdominal pain, diarrhea, constipation and abdominal distention.  Endocrine: Negative.   Genitourinary: Positive for menstrual problem. Negative for dysuria, frequency, hematuria, flank pain, decreased urine volume, enuresis and pelvic pain.  Musculoskeletal: Negative.   Skin: Negative for rash.  Allergic/Immunologic: Negative.   Neurological: Negative for dizziness, tremors, seizures, syncope, speech difficulty, weakness, light-headedness, numbness and headaches.  Hematological: Does not bruise/bleed easily.  Psychiatric/Behavioral: Negative.       Allergies  Review of patient's allergies indicates no known allergies.  Home Medications   Prior to Admission medications   Medication Sig Start Date End Date Taking? Authorizing Provider  OVER THE COUNTER MEDICATION Take 1 tablet by mouth daily. Herbal Lite Weight Loss Supplement   Yes Historical Provider, MD   BP 107/71 mmHg  Pulse 63  Temp(Src) 98.9 F (37.2 C) (Oral)  Resp 17  SpO2 99%  LMP  08/23/2015 Physical Exam  Constitutional: She is oriented to person, place, and time. She appears well-developed and well-nourished.  Anxious, nontoxic   HENT:  Nose: Nose normal.  Poor dentition. Gingival bruising and bleeding diffusely. No ulcerations  noted.   Eyes: Conjunctivae are normal. Right eye exhibits no discharge. Left eye exhibits no discharge. No scleral icterus.  Neck: Neck supple.  Cardiovascular: Normal rate, regular rhythm, normal heart sounds and intact distal pulses.  Exam reveals no gallop and no friction rub.   No murmur heard. Pulmonary/Chest: Effort normal and breath sounds normal. No respiratory distress. She has no wheezes. She has no rales. She exhibits no tenderness.  Abdominal: Soft. Bowel sounds are normal. She exhibits no distension. There is no tenderness. There is no rebound and no guarding.  Musculoskeletal: She exhibits no edema or tenderness.  Lymphadenopathy:    She has no cervical adenopathy.  Neurological: She is alert and oriented to person, place, and time. No cranial nerve deficit. She exhibits normal muscle tone.  Skin: Skin is warm. She is not diaphoretic.  Non-blanching petechial rash noted mostly on the legs, abdomen, chest, some on the back and arms. Ecchymosis noted on the left arm around the elbow.  Psychiatric: She has a normal mood and affect. Her behavior is normal.    ED Course  Procedures (including critical care time)  12:30: [name] presenting with gum bleeding and diffuse petechia concerning for an abnormality in her platelets. DDX includes DIC, ITP, TTP, RMSF (less likely given no fevers, neck pain).  Will obtain DIC panel, CBC with differential, type and screen, CMP. We'll start an IV. Patient will most likely require admission, however we'll wait on lab work.  1:15pm: patient endorsing a headache. CT head STAT ordered given concerns for ICH. Platelet count still not back. PT and PTT mildly elevated, most likely secondary to bleeding, doubt DIC at this point.  1:40pm: received call from lab. Platelet count is 0. Dexamethasone 40mg  already ordered. Ordered 4 units of platelets STAT. Ordered IVIG STAT.  2:05pm: CT with probable mild regions of subarachnoid hemorrhage in  posterior parietal lobes bilaterally, right greater than left, without complicating features. Paged unassigned provider for admission.   Labs Review Labs Reviewed  DIC (DISSEMINATED INTRAVASCULAR COAGULATION) PANEL - Abnormal; Notable for the following:    Prothrombin Time 15.5 (*)    aPTT 39 (*)    Platelets <5 (*)    All other components within normal limits  COMPREHENSIVE METABOLIC PANEL - Abnormal; Notable for the following:    Glucose, Bld 100 (*)    All other components within normal limits  CBC WITH DIFFERENTIAL/PLATELET - Abnormal; Notable for the following:    Platelets <5 (*)    All other components within normal limits  TYPE AND SCREEN  ABO/RH  PREPARE PLATELET PHERESIS    Imaging Review Ct Head Wo Contrast  08/28/2015  CLINICAL DATA:  bruising easily recently, c/o dizziness EXAM: CT HEAD WITHOUT CONTRAST TECHNIQUE: Contiguous axial images were obtained from the base of the skull through the vertex without intravenous contrast. COMPARISON:  None. FINDINGS: Brain: Curvilinear high attenuation in sulci of the posterior parietal lobes bilaterally, right greater than left. No associated parenchymal edema or mass effect. No evidence of acute infarction, extra-axial collection, ventriculomegaly, or mass effect. Vascular: No hyperdense vessel or unexpected calcification. Skull: Negative for fracture or focal lesion. Sinuses/Orbits: No acute findings. Other: None. IMPRESSION: 1. Probable mild regions of subarachnoid hemorrhage in posterior parietal lobes bilaterally, right greater than  left, without complicating features. Critical Value/emergent results were called by telephone at the time of interpretation on 08/28/2015 at 2:05 pm to Dr. Lyndal Pulley , who verbally acknowledged these results. Electronically Signed   By: Corlis Leak M.D.   On: 08/28/2015 14:05   I have personally reviewed and evaluated these images and lab results as part of my medical decision-making.   EKG  Interpretation None      MDM   Final diagnoses:  Acute ITP (HCC)  Nontraumatic intracerebral hemorrhage, unspecified cerebral location, unspecified laterality (HCC)  Gingival bleeding    [name] is a 40 year old female presenting with gingival bleeding and petechiae. Lab findings consistent with ITP. During her stay in the emergency room, she developed a headache diffusely. CT of her head revealed small intraparenchymal hemorrhages.  She has no abdominal pain at this time. The patient received dexamethasone 40 mg orally. 4 units of platelets were ordered, however the patient was only able to receive 1 unit of platelets while in the ED due to demand from the blood bank. I suspect she will consume/degrade this unit of platelets very quickly.  IVIG ordered stat. Discussed the case with Dr. Debe Coder who agreed to admit the patient. At the time the IVIG was ready, the patient had a bed in the SDU. Instructed the pharmacy to send it to her bed in the step down unit so it is ready on her arrival.     Joanna Puff, MD 08/28/15 1530  Lyndal Pulley, MD 08/28/15 1911

## 2015-08-28 NOTE — ED Notes (Signed)
PT reports the oral bleeding woke her up at 0700 today. Pt woke to having blood all over her clothing. Pt has one broken upper front tooth . Pt reports tooth broke 4 years ago. Pt reports she does not take ASA, ibuprofen , BC powders or BC powders. Pt denies any injury to mouth. Pt using [place] suction to remove bloody drainage at this time.

## 2015-08-28 NOTE — ED Notes (Signed)
Pt procedure and consent form explained using translator phone

## 2015-08-28 NOTE — H&P (Signed)
Date: 08/28/2015               Patient Name:  [name] MRN: [id number]  DOB: 1975/09/24 Age / Sex: 40 y.o., female   PCP: No Pcp Per Patient              Medical Service: Internal Medicine Teaching Service              Attending Physician: Dr. Inez Catalina, MD    First Contact: Danelle Earthly, MS 4 Pager: [phone]  Second Contact: Dr. Gara Kroner Pager: [phone]       After Hours (After 5p/  First Contact Pager: [phone]  weekends / holidays): Second Contact Pager: [phone]   Chief Complaint: "bleeding in mouth and some spots on skin"  History of Present Illness: [name] is a 40 y.o. spanish speaking female with no significant PMD presenting with acute bleeding from gums.  Woke up at 0700 with blood on her clothes.  Noticed bleeding from gums that was not stopping so brought to ED where she developed petechial rash.   Spanish Medical sales representative used throughout interview.   LMP began on Monday (08/23/14) and was light but noticed blood clots this morning.  Has noticed blood clots when urinating since October 2016.  History of six uncomplicated spontaneous vaginal deliveries (last in 2104).  Denies recent dental work procedures. Denies personal and  family history of bleeding dyscrasias.  Takes no medications.  Began drinking Herbalife shakes for weight loss in the past 3 weeks and has had 8 pounds of intentional weight loss within the past month.   Last seen my PCP in October for a physical exam. Denies tobacco, alcohol and ilicit drug use.    Patient also complains of pain in lower back that has worsened over the past 3 days.  Began 2-3 weeks ago after her child fell on her back.   In ED, labs showed patient had no platelets. After patient complained of dizziness, non-contrast CT performed and showed SAH.  DIC Panel showed normal INR, fibrinogen and d-dimer levels with slightly elevated PT (15.5) and aPTT(39).  No schistocytes were seen. One dose of  dexamethasone  given and platelet transfusion begun.  IVIG is scheduled.  Hem-Onc consulted with concern for ITP.   Review of Systems: Constitutional: negative for fever and night sweats.  Positive for intentional weight loss HEENT: Positive for active bleeding from gums Cardiovascular: negative  chest pain Pulmonary: no shortness of breath Gastrointestinal:  negative melana Musculoskeletal: pain in lower back Skin:  Diffuse petechiae   Meds: Current Facility-Administered Medications  Medication Dose Route Frequency Provider Last Rate Last Dose  . Immune Globulin 10% (OCTAGAM) IV infusion 100 g  100 g Intravenous Q24 Hr x 2 Joanna Puff, MD        Allergies: Allergies as of 08/28/2015  . (No Known Allergies)   Past Medical History  Diagnosis Date  . Depression    Past Surgical History  Procedure Laterality Date  . Breast surgery  biopsy   History reviewed. No pertinent family history. Social History   Social History  . Marital Status: Married    Spouse Name: N/A  . Number of Children: N/A  . Years of Education: N/A   Occupational History  . Not on file.   Social History Main Topics  . Smoking status: Never Smoker   . Smokeless tobacco: Not on file  . Alcohol Use: No  . Drug Use: No  . Sexual Activity: Not  on file   Other Topics Concern  . Not on file   Social History Narrative    Physical Exam: Blood pressure 80/60, pulse 58, temperature 98.3 F (36.8 C), temperature source Axillary, resp. rate 16, height 5\' 1"  (1.549 m), weight 103 kg (227 lb 1.2 oz), last menstrual period 08/23/2015, SpO2 99 %  General: Non-toxic appearing, no acute distress, daughter at bedside HEENT:  Dried blood in oropharynx with active bleeding from gums Cardiac: regular rate and rhythm, no murmurs, no rubs, no gallops, Abdomen:  Obese abdomen with diffuse petechiae, normal active bowel sounds, no tenderness Skin: petechiae on chest, trunk and lower extremities. Back:   Tenderness to palpation of left lower back. Neuro: CNII-XII intact.  5/5 strength in upper and lower extremties, Normal heel to shin and finger to nose test,  Down going babinski reflex in toes   Lab results: Basic Metabolic Panel:  Recent Labs  04/54/905/01/03 1245  NA 139  K 3.6  CL 107  CO2 27  GLUCOSE 100*  BUN 12  CREATININE 0.62  CALCIUM 9.2   Liver Function Tests:  Recent Labs  08/28/15 1245  AST 20  ALT 21  ALKPHOS 47  BILITOT 0.7  PROT 7.0  ALBUMIN 3.9   CBC:  Recent Labs  08/28/15 1245  WBC 5.8  NEUTROABS 3.9  HGB 12.1  HCT 37.1  MCV 84.9  PLT <5*  <5*   D-Dimer:  Recent Labs  08/28/15 1245  DDIMER <0.27   Coagulation:  Recent Labs  08/28/15 1245  LABPROT 15.5*  INR 1.21   Imaging results:  Ct Head Wo Contrast  08/28/2015  CLINICAL DATA:  bruising easily recently, c/o dizziness EXAM: CT HEAD WITHOUT CONTRAST TECHNIQUE: Contiguous axial images were obtained from the base of the skull through the vertex without intravenous contrast. COMPARISON:  None. FINDINGS: Brain: Curvilinear high attenuation in sulci of the posterior parietal lobes bilaterally, right greater than left. No associated parenchymal edema or mass effect. No evidence of acute infarction, extra-axial collection, ventriculomegaly, or mass effect. Vascular: No hyperdense vessel or unexpected calcification. Skull: Negative for fracture or focal lesion. Sinuses/Orbits: No acute findings. Other: None. IMPRESSION: 1. Probable mild regions of subarachnoid hemorrhage in posterior parietal lobes bilaterally, right greater than left, without complicating features. Critical Value/emergent results were called by telephone at the time of interpretation on 08/28/2015 at 2:05 pm to Dr. Lyndal PulleyANIEL KNOTT , who verbally acknowledged these results. Electronically Signed   By: Corlis Leak  Hassell M.D.   On: 08/28/2015 14:05    Problem List: Active Problems:   Acute ITP [facility](HCC)   Assessment & Plan: [name]  is a 40 y.o. spanish speaking female with no significant PMD who presented with acute bleeding from gums,  petechial rash and severe thrombocytopenia  Active Bleeding and Severe Thrombocytopenia: Platelet count is zero. 4 units platelet transfused. Hemoglobin 12.1. 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 Microangiopathic process (TTP, HUS, DIC)  vs. Infection vs. Liver disease were also considered.  Microangiopathic process is less likely due to lack of schistocytes of blood smear, normal fibrinogen and INR.  LDH and reticulocyte will be drawn to determine if there is a hemolytic process occuring.  Liver disease is not likely as LFTs and INR are normal. Thrombocytopenia can be a common presenting findings in HIV and HCV infection and should therefore be ruled out.  Drug induced etiology must also be considered as patient recently began taking Herbalife nutritional shakes for weight loss.  Heme- Onc consulted. -- R/p  CBC -- order LDH and reticulocyte count -- order Hep C antibody, HIV antibody -- order urine pregnancy test -- Continue IVIG begin Dexamethosone 40mg  daily for 4 days per heme-onc -- Heme- Onc following   Subarachnoid Hemorrhage:  Likely a complication of  Thrombocytopenia. No focal neuro deficits found on physical exam. -- Neuro checks q4h  PPX -- DVT: SCDs  Code Status: FULL  Dispo: pending medical improvement   This is a Psychologist, occupational Note.  The care of the patient was discussed with Dr. Danella Penton and the assessment and plan was formulated with their assistance.  Please see their note for official documentation of the patient encounter.   Signed: Danelle Earthly, Med Student 08/28/2015, 3:39 PM

## 2015-08-28 NOTE — Consult Note (Signed)
[facility]  Telephone:([phone]) [phone]   HEMATOLOGY ONCOLOGY INPATIENT CONSULTATION   [name]  DOB: [birth year]  MR#: [id number]  CSN#: [id number]    Requesting Physician: Family medicine teaching service   Patient Care Team: No Pcp Per Patient as PCP - General (General Practice)  Reason for consult:  Severe thrombocytopenia and gum bleeding  History of present illness:   Pt is a 40 yo spanish-speaking female without significant PMH who presents with sudden onset gum bleeding this morning, presented to [facility] emergency room, was found to have platelet 0, normal WBC and hemoglobin, and was admitted for further management. Patient received 1 unit. And dexamethasone 40 mg in the ER. She is currently receiving IVIG.  She denies any fever, cough, diarrhea lately. She reports newly onset dizziness since yesterday, no other new neurological symptoms. She has mild chronic blurry vision, which has not changed. She denies any significant headaches. No weakness or sensation deficits. She had CT had in the emergency room, which showed probable mild regions of subarachnoid hemorrhage in posterior parietal lobes bilaterally, right greater than left, without complicating features. She denies any other signs of bleeding. She has chronic left flank pain, which has not changed lately. Her menstrual period is irregular, she has it now, spotting, no heavy vaginal bleeding.  MEDICAL HISTORY:  Past Medical History  Diagnosis Date  . Depression     SURGICAL HISTORY: Past Surgical History  Procedure Laterality Date  . Breast surgery  biopsy    SOCIAL HISTORY: Social History   Social History  . Marital Status: Married    Spouse Name: N/A  . Number of Children: N/A  . Years of Education: N/A   Occupational History  . Not on file.   Social History Main Topics  . Smoking status: Never Smoker   . Smokeless tobacco: Not on file  . Alcohol Use: No  . Drug Use: No  .  Sexual Activity: Not on file   Other Topics Concern  . Not on file   Social History Narrative    FAMILY HISTORY: History reviewed. No pertinent family history.  ALLERGIES:  has No Known Allergies.  MEDICATIONS:  Current Facility-Administered Medications  Medication Dose Route Frequency Provider Last Rate Last Dose  . Immune Globulin 10% (OCTAGAM) IV infusion 100 g  100 g Intravenous Q24 Hr x 2 Crystal Derek Mound, MD        REVIEW OF SYSTEMS:   Constitutional: Denies fevers, chills or abnormal night sweats Eyes: Denies blurriness of vision, double vision or watery eyes Ears, nose, mouth, throat, and face: Denies mucositis or sore throat, (+)gum bleeding  Respiratory: Denies cough, dyspnea or wheezes Cardiovascular: Denies palpitation, chest discomfort or lower extremity swelling Gastrointestinal:  Denies nausea, heartburn or change in bowel habits Skin: Denies abnormal skin rashes Lymphatics: Denies new lymphadenopathy or easy bruising Neurological:Denies numbness, tingling or new weaknesses, dizziness(+)  Behavioral/Psych: Mood is stable, no new changes  All other systems were reviewed with the patient and are negative.  PHYSICAL EXAMINATION: ECOG PERFORMANCE STATUS: 1 - Symptomatic but completely ambulatory  Filed Vitals:   08/28/15 1530 08/28/15 1547  BP: 80/60 100/71  Pulse: 58 58  Temp: 98.3 F (36.8 C) 97.8 F (36.6 C)  Resp: 16 14   Filed Weights   08/28/15 1500 08/28/15 1547  Weight: 227 lb 1.2 oz (103 kg) 227 lb 1.2 oz (103 kg)    GENERAL:alert, no distress and comfortable SKIN: skin color, texture, turgor are normal, (+)  diffuse petechia on her trunk and extremities, there is a small bruise on the left side abdomen without blister, no rashes or other significant lesions EYES: normal, conjunctiva are pink and non-injected, sclera clear OROPHARYNX:no exudate, no erythema and lips, buccal mucosa, (+) multiple small bleeding spots in mucosa, and old blood  around her gum  NECK: supple, thyroid normal size, non-tender, without nodularity LYMPH:  no palpable lymphadenopathy in the cervical, axillary or inguinal LUNGS: clear to auscultation and percussion with normal breathing effort HEART: regular rate & rhythm and no murmurs and no lower extremity edema ABDOMEN:abdomen soft, non-tender and normal bowel sounds Musculoskeletal:no cyanosis of digits and no clubbing  PSYCH: alert & oriented x 3 with fluent speech NEURO: no focal motor/sensory deficits  LABORATORY DATA:  I have reviewed the data as listed Lab Results  Component Value Date   WBC 5.8 08/28/2015   HGB 12.1 08/28/2015   HCT 37.1 08/28/2015   MCV 84.9 08/28/2015   PLT <5* 08/28/2015   PLT <5* 08/28/2015    Recent Labs  08/28/15 1245  NA 139  K 3.6  CL 107  CO2 27  GLUCOSE 100*  BUN 12  CREATININE 0.62  CALCIUM 9.2  GFRNONAA >60  GFRAA >60  PROT 7.0  ALBUMIN 3.9  AST 20  ALT 21  ALKPHOS 47  BILITOT 0.7    RADIOGRAPHIC STUDIES: I have personally reviewed the radiological images as listed and agreed with the findings in the report. Ct Head Wo Contrast  08/28/2015  CLINICAL DATA:  bruising easily recently, c/o dizziness EXAM: CT HEAD WITHOUT CONTRAST TECHNIQUE: Contiguous axial images were obtained from the base of the skull through the vertex without intravenous contrast. COMPARISON:  None. FINDINGS: Brain: Curvilinear high attenuation in sulci of the posterior parietal lobes bilaterally, right greater than left. No associated parenchymal edema or mass effect. No evidence of acute infarction, extra-axial collection, ventriculomegaly, or mass effect. Vascular: No hyperdense vessel or unexpected calcification. Skull: Negative for fracture or focal lesion. Sinuses/Orbits: No acute findings. Other: None. IMPRESSION: 1. Probable mild regions of subarachnoid hemorrhage in posterior parietal lobes bilaterally, right greater than left, without complicating features. Critical  Value/emergent results were called by telephone at the time of interpretation on 08/28/2015 at 2:05 pm to Dr. Lyndal PulleyANIEL KNOTT , who verbally acknowledged these results. Electronically Signed   By: Corlis Leak  Hassell M.D.   On: 08/28/2015 14:05    ASSESSMENT & PLAN:   40 year old Spanish-speaking female, presented with sudden onset gum bleeding, CBC showed platelet<5, normal WBC, hemoglobin, DIC panel was negative  1. Severe thrombocytopenia, likely ITP -her clincal presentation is most consistent with ITP, she had plt in 130K range 3 years when she was pregnant   -I discussed the common etiology for acute severe thrombocytopenia, which include autoimmune related (ITP), medication induced, microangiopathy such as TTP/HUS, DIC etc.  -I reviewed her peripheral blood smear, WBC and RBC morphology are normal, no schistocytes. No platelets on the smear. Her reticular count is normal, bilirubin normal, no evidence of hemolysis or microangiopathy. DIC panel was noted.  -I agree with IVIG, she is receiving 1 g/kg now, the pain under her platelet count level in the next 2 days, I'll decide if we give second dose IVIG -I recommend dexamethasone 40mg  daily for 4 days, she has received the first dose in ED today -I anticipate her plt will improved in the next few days, if not, will give second dose IVIG -reserved platelet transfusion for clinical significant bleeding. She does  have mild neurological symptoms, I will consider repeat a CT head without contrast in a few days to see if she has more intracranial bleeding.  -Her chronic back pain has not changed, I do not think it's related to her ITP or bleeding   Recommendations: -Continue dexamethasone 40 mg IV daily, for total of 4 days -Based on her platelet count in the next 2 days, we'll decide if we need to repeat IVIG -daily CBC -consider repeat CT head in a few days to monitor intracranial bleeding -I'll follow-up closely.   All questions were answered. The  patient knows to call the clinic with any problems, questions or concerns.      Malachy Mood, MD 08/28/2015 4:03 PM

## 2015-08-29 ENCOUNTER — Inpatient Hospital Stay (HOSPITAL_COMMUNITY): Payer: Medicaid Other

## 2015-08-29 DIAGNOSIS — R51 Headache: Secondary | ICD-10-CM

## 2015-08-29 LAB — PREPARE PLATELET PHERESIS
UNIT DIVISION: 0
UNIT DIVISION: 0
Unit division: 0
Unit division: 0

## 2015-08-29 LAB — CBC
HCT: 33.8 % — ABNORMAL LOW (ref 36.0–46.0)
Hemoglobin: 11.1 g/dL — ABNORMAL LOW (ref 12.0–15.0)
MCH: 27.6 pg (ref 26.0–34.0)
MCHC: 32.8 g/dL (ref 30.0–36.0)
MCV: 84.1 fL (ref 78.0–100.0)
Platelets: 6 10*3/uL — CL (ref 150–400)
RBC: 4.02 MIL/uL (ref 3.87–5.11)
RDW: 13.4 % (ref 11.5–15.5)
WBC: 7.8 10*3/uL (ref 4.0–10.5)

## 2015-08-29 LAB — HAPTOGLOBIN: HAPTOGLOBIN: 127 mg/dL (ref 34–200)

## 2015-08-29 LAB — HIV ANTIBODY (ROUTINE TESTING W REFLEX): HIV SCREEN 4TH GENERATION: NONREACTIVE

## 2015-08-29 LAB — ABO/RH: ABO/RH(D): O POS

## 2015-08-29 MED ORDER — SODIUM CHLORIDE 0.9 % IV SOLN
40.0000 mg | Freq: Every day | INTRAVENOUS | Status: AC
  Administered 2015-08-29 – 2015-08-31 (×3): 40 mg via INTRAVENOUS
  Filled 2015-08-29 (×3): qty 4

## 2015-08-29 MED ORDER — TRAMADOL HCL 50 MG PO TABS
50.0000 mg | ORAL_TABLET | Freq: Two times a day (BID) | ORAL | Status: DC | PRN
  Administered 2015-08-29 – 2015-09-14 (×13): 50 mg via ORAL
  Filled 2015-08-29 (×14): qty 1

## 2015-08-29 NOTE — Progress Notes (Signed)
Subjective: Reports a headache, denies weakness and diarrhea. She states gum bleeding has stopped but she can still taste blood.  Objective: Vital signs in last 24 hours: Filed Vitals:   08/29/15 0000 08/29/15 0340 08/29/15 0700 08/29/15 0822  BP: 97/55 103/60  106/57  Pulse: 74 71  66  Temp:  98.2 F (36.8 C) 98.9 F (37.2 C)   TempSrc:  Oral Oral   Resp: 17 16  19   Height:      Weight:      SpO2: 98% 95%  97%   Weight change:   Intake/Output Summary (Last 24 hours) at 08/29/15 1015 Last data filed at 08/29/15 1000  Gross per 24 hour  Intake 2352.4 ml  Output    650 ml  Net 1702.4 ml   General: NAD, laying in bed comfortably HEENT: dried blood around gums, no active bleeding noted Lungs: CTAB, no wheezing or crackles Cardiac: RRR, no murmurs, rubs, or gallops GI: soft, active bowel sounds, non TTP Neuro: CN II-XII grossly intact Skin: petechial rash improved on legs, chest, and abdomen.   Lab Results: Basic Metabolic Panel:  Recent Labs Lab 08/28/15 1245  NA 139  K 3.6  CL 107  CO2 27  GLUCOSE 100*  BUN 12  CREATININE 0.62  CALCIUM 9.2   Liver Function Tests:  Recent Labs Lab 08/28/15 1245  AST 20  ALT 21  ALKPHOS 47  BILITOT 0.7  PROT 7.0  ALBUMIN 3.9   CBC:  Recent Labs Lab 08/28/15 1245 08/29/15 0448  WBC 5.8 7.8  NEUTROABS 3.9  --   HGB 12.1 11.1*  HCT 37.1 33.8*  MCV 84.9 84.1  PLT <5*  <5* 6*   D-Dimer:  Recent Labs Lab 08/28/15 1245  DDIMER <0.27   Coagulation:  Recent Labs Lab 08/28/15 1245  LABPROT 15.5*  INR 1.21   Anemia Panel:  Recent Labs Lab 08/28/15 1758  RETICCTPCT 1.2    Micro Results: Recent Results (from the past 240 hour(s))  MRSA PCR Screening     Status: None   Collection Time: 08/28/15  3:37 PM  Result Value Ref Range Status   MRSA by PCR NEGATIVE NEGATIVE Final    Comment:        The GeneXpert MRSA Assay (FDA approved for NASAL specimens only), is one component of a comprehensive  MRSA colonization surveillance program. It is not intended to diagnose MRSA infection nor to guide or monitor treatment for MRSA infections. [facility]    Studies/Results: Ct Head Wo Contrast  08/28/2015  CLINICAL DATA:  bruising easily recently, c/o dizziness EXAM: CT HEAD WITHOUT CONTRAST TECHNIQUE: Contiguous axial images were obtained from the base of the skull through the vertex without intravenous contrast. COMPARISON:  None. FINDINGS: Brain: Curvilinear high attenuation in sulci of the posterior parietal lobes bilaterally, right greater than left. No associated parenchymal edema or mass effect. No evidence of acute infarction, extra-axial collection, ventriculomegaly, or mass effect. Vascular: No hyperdense vessel or unexpected calcification. Skull: Negative for fracture or focal lesion. Sinuses/Orbits: No acute findings. Other: None. IMPRESSION: 1. Probable mild regions of subarachnoid hemorrhage in posterior parietal lobes bilaterally, right greater than left, without complicating features. Critical Value/emergent results were called by telephone at the time of interpretation on 08/28/2015 at 2:05 pm to Dr. Lyndal PulleyANIEL KNOTT , who verbally acknowledged these results. Electronically Signed   By: Corlis Leak  Hassell M.D.   On: 08/28/2015 14:05   Medications: I have reviewed the patient's current  medications. Scheduled Meds: . sodium chloride   Intravenous Once  . dexamethasone (DECADRON) IVPB [facility]  40 mg Intravenous Daily  . Immune Globulin 10%  100 g Intravenous Q24 Hr x 2   Continuous Infusions:  PRN Meds:.acetaminophen, traMADol Assessment/Plan: Active Problems:   Acute ITP (HCC)   Severe thrombocytopenia 2/2 ITP-- plts this morning are 6.  Urine pregnancy test negative, HIV negative. Hep C pending. - hemo/onc following, appreciate their recommendations, continue IV decadron  x 3 days - neuro checks q4h - bed rest, fall precaution - type screened for 2  units of RBC, 2 peripheral IV lines in place - CBC in the morning    Dispo: Disposition is deferred at this time, awaiting improvement of current medical problems.   The patient does have a current PCP (No Pcp Per Patient) and does not need an [facility] hospital follow-up appointment after discharge.  The patient does not have transportation limitations that hinder transportation to clinic appointments. .Services Needed at time of discharge: Y = Yes, Blank = No PT:   OT:   RN:   Equipment:   Other:     LOS: 1 day   Denton Brick, MD 08/29/2015, 10:15 AM

## 2015-08-29 NOTE — Progress Notes (Signed)
Subjective: No acute events overnight. Complains of headache this morning.  Bleeding from gums has improved.  Denies weakness, vision changes, diarrhea and abdominal pain.   Patient's daughter asked to bring in home Herbalife supplement tomorrow morning.  Objective: Vital signs in last 24 hours: Filed Vitals:   08/28/15 2329 08/29/15 0000 08/29/15 0340 08/29/15 0700  BP: 103/63 97/55 103/60   Pulse: 74 74 71   Temp: 98.4 F (36.9 C)  98.2 F (36.8 C) 98.9 F (37.2 C)  TempSrc: Oral  Oral Oral  Resp: 19 17 16    Height:      Weight:      SpO2: 97% 98% 95%    Weight change:   Intake/Output Summary (Last 24 hours) at 08/29/15 0917 Last data filed at 08/28/15 2330  Gross per 24 hour  Intake 2058.4 ml  Output    650 ml  Net 1408.4 ml   General: alert, cooperative, no acute distress, laying flat in bed HEENT:  Small amount of blood on gums Cardiovascular: regular rate and rhythm, no murmurs, no rubs, no gallops Pulmonary:  Clear to auscultation bilaterally, no pretibial edema Abdomen: obese abdomen, non tender, non-distended, normal active bowel sounds Skin: petechiae seen on chest and abdomen  Lab Results:  CBC:  Recent Labs  08/28/15 1245 08/29/15 0448  WBC 5.8 7.8  NEUTROABS 3.9  --   HGB 12.1 11.1*  HCT 37.1 33.8*  MCV 84.9 84.1  PLT <5*  <5* 6*   Anemia Panel:  Recent Labs  08/28/15 1758  RETICCTPCT 1.2    Micro Results: Recent Results (from the past 240 hour(s))  MRSA PCR Screening     Status: None   Collection Time: 08/28/15  3:37 PM  Result Value Ref Range Status   MRSA by PCR NEGATIVE NEGATIVE Final    Comment:        The GeneXpert MRSA Assay (FDA approved for NASAL specimens only), is one component of a comprehensive MRSA colonization surveillance program. It is not intended to diagnose MRSA infection nor to guide or monitor treatment for MRSA infections. [facility]    Studies/Results: Ct Head Wo  Contrast  08/28/2015  CLINICAL DATA:  bruising easily recently, c/o dizziness EXAM: CT HEAD WITHOUT CONTRAST TECHNIQUE: Contiguous axial images were obtained from the base of the skull through the vertex without intravenous contrast. COMPARISON:  None. FINDINGS: Brain: Curvilinear high attenuation in sulci of the posterior parietal lobes bilaterally, right greater than left. No associated parenchymal edema or mass effect. No evidence of acute infarction, extra-axial collection, ventriculomegaly, or mass effect. Vascular: No hyperdense vessel or unexpected calcification. Skull: Negative for fracture or focal lesion. Sinuses/Orbits: No acute findings. Other: None. IMPRESSION: 1. Probable mild regions of subarachnoid hemorrhage in posterior parietal lobes bilaterally, right greater than left, without complicating features. Critical Value/emergent results were called by telephone at the time of interpretation on 08/28/2015 at 2:05 pm to Dr. Lyndal PulleyANIEL KNOTT , who verbally acknowledged these results. Electronically Signed   By: Corlis Leak  Hassell M.D.   On: 08/28/2015 14:05   Scheduled Meds: . sodium chloride   Intravenous Once  . dexamethasone (DECADRON) IVPB [facility]  40 mg Intravenous Daily  . Immune Globulin 10%  100 g Intravenous Q24 Hr x 2   Continuous Infusions:  PRN Meds:.acetaminophen, traMADol   Problem List: Active Problems:   Acute ITP (HCC)  Assessment/Plan: 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   Acute ITP: Patient had a Plts 6 today. Hemodynamically stable. Thrombocytopenia can be a common presenting findings in HIV and HCV.  HIV antibody nonreactive and Hep C pending. LDH and reticulocyte were within normal limits ruling out bone marrow disfunction and hemolysis. Drug induced etiology must also be considered as patient recently began taking Herbalife nutritional shakes for weight loss.[name]  will bring in home Herbalife supplement in the morning, plan to look over ingredients.  Heme following -- r/p CBC in morning -- Continue IVIG and dexamethasone  Subarachnoid Hemorrhage:  CT head on admission consistent with SAH. Hemodynamically stable. Complains of headache today.   -- begin Ultram  q8 PRN -- continue neuro checks q4h -- Consider r/p CT to reevaluate [facility] tomorrow  This is a Psychologist, occupational Note.  The care of the patient was discussed with Dr. Danella Penton and the assessment and plan formulated with their assistance.  Please see their attached note for official documentation of the daily encounter.   LOS: 1 day   Danelle Earthly, Med Student 08/29/2015, 9:17 AM

## 2015-08-29 NOTE — Progress Notes (Signed)
[name]   DOB:[birth year]   ZO#:[id number]   JWJ#:[id number]  HEMATOLOGY SERVICE   Subjective: Pt complains of headache 8-9/10, since last night, did not sleep well, blurry vision unhcanged, no other new neuro symptoms, gum bleeding has stopped, no other bleeding. plt 6K this morning   Objective:  Filed Vitals:   08/29/15 0700 08/29/15 0822  BP:  106/57  Pulse:  66  Temp: 98.9 F (37.2 C)   Resp:  19    Body mass index is 42.93 kg/(m^2).  Intake/Output Summary (Last 24 hours) at 08/29/15 1233 Last data filed at 08/29/15 1000  Gross per 24 hour  Intake 2352.4 ml  Output    650 ml  Net 1702.4 ml     Sclerae unicteric  Oropharynx clear, (+) multiple small bleeding spots in mucosa, and old blood around her gum  No peripheral adenopathy  Lungs clear -- no rales or rhonchi  Heart regular rate and rhythm  Abdomen benign  MSK no focal spinal tenderness, no peripheral edema  Neuro nonfocal  Skin: (+) diffuse petechia on her trunk and extremities, there is a small bruise on the left side abdomen  without blister, no rashes or other significant lesions  CBG (last 3)  No results for input(s): GLUCAP in the last 72 hours.   Labs:  Lab Results  Component Value Date   WBC 7.8 08/29/2015   HGB 11.1* 08/29/2015   HCT 33.8* 08/29/2015   MCV 84.1 08/29/2015   PLT 6* 08/29/2015   NEUTROABS 3.9 08/28/2015    @  Urine Studies No results for input(s): UHGB, CRYS in the last 72 hours.  Invalid input(s): UACOL, UAPR, USPG, UPH, UTP, UGL, UKET, UBIL, UNIT, UROB, ULEU, UEPI, UWBC, URBC, UBAC, CAST, UCOM, BILUA  Basic Metabolic Panel:  Recent Labs Lab 08/28/15 1245  NA 139  K 3.6  CL 107  CO2 27  GLUCOSE 100*  BUN 12  CREATININE 0.62  CALCIUM 9.2   GFR Estimated Creatinine Clearance: 103.2 mL/min (by C-G formula based on Cr of 0.62). Liver Function Tests:  Recent Labs Lab 08/28/15 1245  AST 20  ALT 21  ALKPHOS 47  BILITOT 0.7  PROT 7.0   ALBUMIN 3.9   No results for input(s): LIPASE, AMYLASE in the last 168 hours. No results for input(s): AMMONIA in the last 168 hours. Coagulation profile  Recent Labs Lab 08/28/15 1245  INR 1.21    CBC:  Recent Labs Lab 08/28/15 1245 08/29/15 0448  WBC 5.8 7.8  NEUTROABS 3.9  --   HGB 12.1 11.1*  HCT 37.1 33.8*  MCV 84.9 84.1  PLT <5*  <5* 6*   Cardiac Enzymes: No results for input(s): CKTOTAL, CKMB, CKMBINDEX, TROPONINI in the last 168 hours. BNP: Invalid input(s): POCBNP CBG: No results for input(s): GLUCAP in the last 168 hours. D-Dimer  Recent Labs  08/28/15 1245  DDIMER <0.27   Hgb A1c No results for input(s): HGBA1C in the last 72 hours. Lipid Profile No results for input(s): CHOL, HDL, LDLCALC, TRIG, CHOLHDL, LDLDIRECT in the last 72 hours. Thyroid function studies No results for input(s): TSH, T4TOTAL, T3FREE, THYROIDAB in the last 72 hours.  Invalid input(s): FREET3 Anemia work up  Recent Labs  08/28/15 1758  RETICCTPCT 1.2   Microbiology Recent Results (from the past 240 hour(s))  MRSA PCR Screening     Status: None   Collection Time: 08/28/15  3:37 PM  Result Value Ref Range Status   MRSA by PCR NEGATIVE NEGATIVE Final  Comment:        The GeneXpert MRSA Assay (FDA approved for NASAL specimens only), is one component of a comprehensive MRSA colonization surveillance program. It is not intended to diagnose MRSA infection nor to guide or monitor treatment for MRSA infections. Performed at [facility]       Studies:  Ct Head Wo Contrast  08/28/2015  CLINICAL DATA:  bruising easily recently, c/o dizziness EXAM: CT HEAD WITHOUT CONTRAST TECHNIQUE: Contiguous axial images were obtained from the base of the skull through the vertex without intravenous contrast. COMPARISON:  None. FINDINGS: Brain: Curvilinear high attenuation in sulci of the posterior parietal lobes bilaterally, right greater than left. No  associated parenchymal edema or mass effect. No evidence of acute infarction, extra-axial collection, ventriculomegaly, or mass effect. Vascular: No hyperdense vessel or unexpected calcification. Skull: Negative for fracture or focal lesion. Sinuses/Orbits: No acute findings. Other: None. IMPRESSION: 1. Probable mild regions of subarachnoid hemorrhage in posterior parietal lobes bilaterally, right greater than left, without complicating features. Critical Value/emergent results were called by telephone at the time of interpretation on 08/28/2015 at 2:05 pm to Dr. Lyndal PulleyANIEL KNOTT , who verbally acknowledged these results. Electronically Signed   By: Corlis Leak  Hassell M.D.   On: 08/28/2015 14:05    Assessment and plan: 40 y.o. Spanish-speaking female, presented with sudden onset gum bleeding, CBC showed platelet<5, normal WBC, hemoglobin, DIC panel was negative  1. Severe thrombocytopenia, likely ITP -She has received IVIG 1 g/kg, dexamethasone 40 mg daily, day 2 today, and a 1 unit of platelet last night. -Her platelet has slightly improved, likely secondary to transfusion. -Given her significant headache, I recommend to get a CT head without contrast to evaluate her intracranial hemorrhage. If significant intracranial bleeding, please give 1 more unit plt today and will give second dose IVIG. If head CT negative, then will hold off plt transfusion and IV Ig today -Continue dexamethasone 40 mg daily today and 2 more days.  I discussed with the resident who covers fro Dr. Danella Pentonruong today. I will follow up closely.   [name], Jesstin Studstill, MD 08/29/2015  12:33 PM

## 2015-08-30 LAB — GLUCOSE, CAPILLARY
GLUCOSE-CAPILLARY: 131 mg/dL — AB (ref 65–99)
GLUCOSE-CAPILLARY: 277 mg/dL — AB (ref 65–99)
Glucose-Capillary: 217 mg/dL — ABNORMAL HIGH (ref 65–99)
Glucose-Capillary: 163 mg/dL — ABNORMAL HIGH (ref 65–99)

## 2015-08-30 LAB — CBC
HCT: 30.8 % — ABNORMAL LOW (ref 36.0–46.0)
Hemoglobin: 10 g/dL — ABNORMAL LOW (ref 12.0–15.0)
MCH: 27.5 pg (ref 26.0–34.0)
MCHC: 32.5 g/dL (ref 30.0–36.0)
MCV: 84.6 fL (ref 78.0–100.0)
PLATELETS: 11 10*3/uL — AB (ref 150–400)
RBC: 3.64 MIL/uL — ABNORMAL LOW (ref 3.87–5.11)
RDW: 13.7 % (ref 11.5–15.5)
WBC: 13.7 10*3/uL — ABNORMAL HIGH (ref 4.0–10.5)

## 2015-08-30 LAB — HEPATITIS C ANTIBODY (REFLEX): HCV Ab: 0.2 s/co ratio (ref 0.0–0.9)

## 2015-08-30 LAB — HCV COMMENT:

## 2015-08-30 MED ORDER — INSULIN ASPART 100 UNIT/ML ~~LOC~~ SOLN
0.0000 [IU] | SUBCUTANEOUS | Status: DC
  Administered 2015-08-30: 5 [IU] via SUBCUTANEOUS
  Administered 2015-08-30: 3 [IU] via SUBCUTANEOUS
  Administered 2015-08-30: 2 [IU] via SUBCUTANEOUS
  Administered 2015-08-30: 1 [IU] via SUBCUTANEOUS
  Administered 2015-08-31: 3 [IU] via SUBCUTANEOUS
  Administered 2015-08-31 (×2): 1 [IU] via SUBCUTANEOUS
  Administered 2015-08-31: 2 [IU] via SUBCUTANEOUS

## 2015-08-30 NOTE — Progress Notes (Signed)
Patient arrived from 3S to [id number] along accompanied by daughter. Safety precautions and orders reviewed with patient/family. Per daughter, she wishes to be the translator at this time. Risks of bleeding reviewed again and to notify RN if bleeding noted. PRN headache med administered per request. Will continue to monitor.   Sim BoastHavy, RN

## 2015-08-30 NOTE — Progress Notes (Signed)
Subjective: No acute events overnight.  No active bleeding from gums. Patient complained of headache and dizziness this morning which has improved after dose of Tramadol.  Denies fevers, sweats, chills, and abdominal pain.   Objective: Vital signs in last 24 hours: Filed Vitals:   08/29/15 2200 08/29/15 2300 08/30/15 0242 08/30/15 0730  BP: 111/55 97/36 96/60  112/73  Pulse: 77 72 76 67  Temp:  98.3 F (36.8 C) 98 F (36.7 C) 97.9 F (36.6 C)  TempSrc:  Oral Axillary Oral  Resp: 20 20 16 17   Height:      Weight:      SpO2: 98% 99% 97% 99%   Weight change:   Intake/Output Summary (Last 24 hours) at 08/30/15 0956 Last data filed at 08/30/15 0730  Gross per 24 hour  Intake   2200 ml  Output    550 ml  Net   1650 ml   General: alert, cooperative, no acute distress, laying flat in bed HEENT:  No blood seen in oropharynx Cardiovascular: regular rate and rhythm, no murmurs, no rubs, no gallops Pulmonary:  Clear to auscultation bilaterally, no pretibial edema Abdomen: obese abdomen, non tender, non-distended, normal active bowel sounds Skin: petechiae seen on chest,  Abdomen and lower extremities  Lab Results:  CBC:  Recent Labs  08/28/15 1245 08/29/15 0448 08/30/15 0513  WBC 5.8 7.8 13.7*  NEUTROABS 3.9  --   --   HGB 12.1 11.1* 10.0*  HCT 37.1 33.8* 30.8*  MCV 84.9 84.1 84.6  PLT <5*  <5* 6* 11*   CBG:  Recent Labs  08/30/15 0825  GLUCAP 131*   HMicro Results: Recent Results (from the past 240 hour(s))  MRSA PCR Screening     Status: None   Collection Time: 08/28/15  3:37 PM  Result Value Ref Range Status   MRSA by PCR NEGATIVE NEGATIVE Final    Comment:        The GeneXpert MRSA Assay (FDA approved for NASAL specimens only), is one component of a comprehensive MRSA colonization surveillance program. It is not intended to diagnose MRSA infection nor to guide or monitor treatment for MRSA infections. Performed at [facility]      Studies/Results: Ct Head Wo Contrast  08/29/2015  CLINICAL DATA:  Follow up subarachnoid hemorrhage. EXAM: CT HEAD WITHOUT CONTRAST TECHNIQUE: Contiguous axial images were obtained from the base of the skull through the vertex without intravenous contrast. COMPARISON:  August 28, 2015 FINDINGS: No subdural or epidural hemorrhage identified on today's study. The suspected subarachnoid hemorrhage on the previous study has certainly not worsened and is probably mildly improved with the most prominent region on series 2, image 22 on the right. The cerebellum, brainstem, and basal cisterns are normal. Ventricles and sulci are normal. No acute cortical ischemia or infarct. No mass, mass effect, or midline shift. IMPRESSION: The previously noted subarachnoid hemorrhage is improved in the interval. Recommend continued attention on follow-up. The findings of subarachnoid hemorrhage were called at the time of the previous study. The clinical team is aware. Electronically Signed   By: Gerome Samavid  Williams III M.D   On: 08/29/2015 13:23   Ct Head Wo Contrast  08/28/2015  CLINICAL DATA:  bruising easily recently, c/o dizziness EXAM: CT HEAD WITHOUT CONTRAST TECHNIQUE: Contiguous axial images were obtained from the base of the skull through the vertex without intravenous contrast. COMPARISON:  None. FINDINGS: Brain: Curvilinear high attenuation in sulci of the posterior parietal lobes bilaterally, right greater than left. No  associated parenchymal edema or mass effect. No evidence of acute infarction, extra-axial collection, ventriculomegaly, or mass effect. Vascular: No hyperdense vessel or unexpected calcification. Skull: Negative for fracture or focal lesion. Sinuses/Orbits: No acute findings. Other: None. IMPRESSION: 1. Probable mild regions of subarachnoid hemorrhage in posterior parietal lobes bilaterally, right greater than left, without complicating features. Critical Value/emergent results were called by telephone at  the time of interpretation on 08/28/2015 at 2:05 pm to Dr. Lyndal Pulley , who verbally acknowledged these results. Electronically Signed   By: Corlis Leak M.D.   On: 08/28/2015 14:05   Medications:   Scheduled Meds: . sodium chloride   Intravenous Once  . dexamethasone (DECADRON) IVPB [facility]  40 mg Intravenous Daily  . insulin aspart  0-9 Units Subcutaneous Q4H   Continuous Infusions:  PRN Meds:.acetaminophen, traMADol   Problem List Active Problems:   Acute ITP (HCC)  Assessment/Plan: [name] is a 40 year old spanish speaking woman with no significant PMH that presented with active acute bleeding from gums who was found to have severe thrombocytopenia  most likely due to acute ITP.  Acute ITP--improving:   Plts 11K today. Wbc 3.7 on high dose steroids.  Assisted ambulation and PT recommended to prevent deconditioning as patient's risk of spontaneous bleeding is decreased now that plt count greater than 10K.    Patient is hemodynamically stable enough to be transferred to a stepdown unit. Heme following -- continue dexamethasone  (Day 3 of 4) -- Transfer to step down unit -- PT today -- r/p CBC in AM   Subarachnoid Hemorrhage:  Heme suggested  R/p CT showed no worsening of SAH and mild improvement.  Headaches controlled with PRN medication.  -- neuro checks 24h   -- on Tramodol  PRN  Steroid Induced Hyperglycemia: Blood glucose 131 on high dose steroids. -- begin sliding scale (Novolog) -- Glucose checks q4h   This is a Psychologist, occupational Note.  The care of the patient was discussed with Dr. Danella Penton and the assessment and plan formulated with their assistance.  Please see their attached note for official documentation of the daily encounter.   LOS: 2 days   Danelle Earthly, Med Student 08/30/2015, 9:56 AM

## 2015-08-30 NOTE — Progress Notes (Signed)
Subjective Having a headache that improved w/ tylenol. Denies bleeding and fever. Feels weak and dizzy.   Objective: Vital signs in last 24 hours: Filed Vitals:   08/29/15 2200 08/29/15 2300 08/30/15 0242 08/30/15 0730  BP: 111/55 97/36 96/60  112/73  Pulse: 77 72 76 67  Temp:  98.3 F (36.8 C) 98 F (36.7 C) 97.9 F (36.6 C)  TempSrc:  Oral Axillary Oral  Resp: 20 20 16 17   Height:      Weight:      SpO2: 98% 99% 97% 99%   Weight change:   Intake/Output Summary (Last 24 hours) at 08/30/15 0952 Last data filed at 08/30/15 0730  Gross per 24 hour  Intake   2200 ml  Output    550 ml  Net   1650 ml   General: NAD, laying in bed comfortably HEENT: neg for bleeding, poor dentition.  Lungs: CTAB, no wheezing or crackles Cardiac: RRR, no murmurs, rubs, or gallops GI: soft, active bowel sounds, non TTP Neuro: CN II-XII grossly intact Skin: petechial rash improved on legs, chest, and abdomen.   Lab Results: Basic Metabolic Panel:  Recent Labs Lab 08/28/15 1245  NA 139  K 3.6  CL 107  CO2 27  GLUCOSE 100*  BUN 12  CREATININE 0.62  CALCIUM 9.2   Liver Function Tests:  Recent Labs Lab 08/28/15 1245  AST 20  ALT 21  ALKPHOS 47  BILITOT 0.7  PROT 7.0  ALBUMIN 3.9   CBC:  Recent Labs Lab 08/28/15 1245 08/29/15 0448 08/30/15 0513  WBC 5.8 7.8 13.7*  NEUTROABS 3.9  --   --   HGB 12.1 11.1* 10.0*  HCT 37.1 33.8* 30.8*  MCV 84.9 84.1 84.6  PLT <5*  <5* 6* 11*   D-Dimer:  Recent Labs Lab 08/28/15 1245  DDIMER <0.27   Coagulation:  Recent Labs Lab 08/28/15 1245  LABPROT 15.5*  INR 1.21   Anemia Panel:  Recent Labs Lab 08/28/15 1758  RETICCTPCT 1.2    Micro Results: Recent Results (from the past 240 hour(s))  MRSA PCR Screening     Status: None   Collection Time: 08/28/15  3:37 PM  Result Value Ref Range Status   MRSA by PCR NEGATIVE NEGATIVE Final    Comment:        The GeneXpert MRSA Assay (FDA approved for NASAL  specimens only), is one component of a comprehensive MRSA colonization surveillance program. It is not intended to diagnose MRSA infection nor to guide or monitor treatment for MRSA infections. Performed [facility]    Studies/Results: Ct Head Wo Contrast  08/29/2015  CLINICAL DATA:  Follow up subarachnoid hemorrhage. EXAM: CT HEAD WITHOUT CONTRAST TECHNIQUE: Contiguous axial images were obtained from the base of the skull through the vertex without intravenous contrast. COMPARISON:  August 28, 2015 FINDINGS: No subdural or epidural hemorrhage identified on today's study. The suspected subarachnoid hemorrhage on the previous study has certainly not worsened and is probably mildly improved with the most prominent region on series 2, image 22 on the right. The cerebellum, brainstem, and basal cisterns are normal. Ventricles and sulci are normal. No acute cortical ischemia or infarct. No mass, mass effect, or midline shift. IMPRESSION: The previously noted subarachnoid hemorrhage is improved in the interval. Recommend continued attention on follow-up. The findings of subarachnoid hemorrhage were called at the time of the previous study. The clinical team is aware. Electronically Signed   By: Gerome Samavid  Williams III M.D  On: 08/29/2015 13:23   Ct Head Wo Contrast  08/28/2015  CLINICAL DATA:  bruising easily recently, c/o dizziness EXAM: CT HEAD WITHOUT CONTRAST TECHNIQUE: Contiguous axial images were obtained from the base of the skull through the vertex without intravenous contrast. COMPARISON:  None. FINDINGS: Brain: Curvilinear high attenuation in sulci of the posterior parietal lobes bilaterally, right greater than left. No associated parenchymal edema or mass effect. No evidence of acute infarction, extra-axial collection, ventriculomegaly, or mass effect. Vascular: No hyperdense vessel or unexpected calcification. Skull: Negative for fracture or focal lesion. Sinuses/Orbits: No  acute findings. Other: None. IMPRESSION: 1. Probable mild regions of subarachnoid hemorrhage in posterior parietal lobes bilaterally, right greater than left, without complicating features. Critical Value/emergent results were called by telephone at the time of interpretation on 08/28/2015 at 2:05 pm to Dr. Lyndal Pulley , who verbally acknowledged these results. Electronically Signed   By: Corlis Leak M.D.   On: 08/28/2015 14:05   Medications: I have reviewed the patient's current medications. Scheduled Meds: . sodium chloride   Intravenous Once  . dexamethasone (DECADRON) IVPB [facility]  40 mg Intravenous Daily  . insulin aspart  0-9 Units Subcutaneous Q4H   Continuous Infusions:  PRN Meds:.acetaminophen, traMADol Assessment/Plan: Active Problems:   Acute ITP (HCC)   Severe thrombocytopenia 2/2 ITP-- plts this morning are 11.  Hep C pending.  - hemo/onc following, appreciate their recommendations, continue IV decadron  x 2 more days - neuro checks q4h - plts >10 thus decreased risk for spontaneous bleeding, ordered PT evaluation, up with assistance - CBC in the morning - placed on sliding scale due to high dose steroids - transfer to med surg   Dispo: Disposition is deferred at this time, awaiting improvement of current medical problems.   The patient does have a current PCP (No Pcp Per Patient) and does not need an [facility] hospital follow-up appointment after discharge.  The patient does not have transportation limitations that hinder transportation to clinic appointments. .Services Needed at time of discharge: Y = Yes, Blank = No PT:   OT:   RN:   Equipment:   Other:     LOS: 2 days   Denton Brick, MD 08/30/2015, 9:52 AM

## 2015-08-30 NOTE — Progress Notes (Signed)
Interpreter Graciela Namihira for patient °

## 2015-08-30 NOTE — Progress Notes (Signed)
RN attempted to ambulate patient but pt verbalized she is dizzy at this time. Will attempt at a later time.  Sim BoastHavy, RN

## 2015-08-30 NOTE — Progress Notes (Signed)
Report called to 5C06 pt to transfer via w/c with belongings.

## 2015-08-30 NOTE — Care Management Note (Signed)
Case Management Note  Patient Details  Name: [name] MRN: [id number] Date of Birth: 06/22/1975  Subjective/Objective:    [facility] She is from home with her spouse.  Action/Plan: Continue medical work up. CM following for d/c needs.   Expected Discharge Date:                  Expected Discharge Plan:     In-House Referral:     Discharge planning Services     Post Acute Care Choice:    Choice offered to:     DME Arranged:    DME Agency:     HH Arranged:    HH Agency:     Status of Service:  In process, will continue to follow  Medicare Important Message Given:    Date Medicare IM Given:    Medicare IM give by:    Date Additional Medicare IM Given:    Additional Medicare Important Message give by:     If discussed at Long Length of Stay Meetings, dates discussed:    Additional Comments:  Kermit BaloKelli F Slayter Moorhouse, RN 08/30/2015, 4:11 PM

## 2015-08-31 DIAGNOSIS — D649 Anemia, unspecified: Secondary | ICD-10-CM

## 2015-08-31 LAB — GLUCOSE, CAPILLARY
GLUCOSE-CAPILLARY: 130 mg/dL — AB (ref 65–99)
GLUCOSE-CAPILLARY: 137 mg/dL — AB (ref 65–99)
GLUCOSE-CAPILLARY: 158 mg/dL — AB (ref 65–99)
GLUCOSE-CAPILLARY: 237 mg/dL — AB (ref 65–99)
Glucose-Capillary: 116 mg/dL — ABNORMAL HIGH (ref 65–99)
Glucose-Capillary: 253 mg/dL — ABNORMAL HIGH (ref 65–99)

## 2015-08-31 LAB — CBC
HCT: 32.2 % — ABNORMAL LOW (ref 36.0–46.0)
Hemoglobin: 10.4 g/dL — ABNORMAL LOW (ref 12.0–15.0)
MCH: 27.2 pg (ref 26.0–34.0)
MCHC: 32.3 g/dL (ref 30.0–36.0)
MCV: 84.3 fL (ref 78.0–100.0)
PLATELETS: 8 10*3/uL — AB (ref 150–400)
RBC: 3.82 MIL/uL — ABNORMAL LOW (ref 3.87–5.11)
RDW: 13.7 % (ref 11.5–15.5)
WBC: 10.6 10*3/uL — ABNORMAL HIGH (ref 4.0–10.5)

## 2015-08-31 MED ORDER — INSULIN ASPART 100 UNIT/ML ~~LOC~~ SOLN
0.0000 [IU] | Freq: Three times a day (TID) | SUBCUTANEOUS | Status: DC
  Administered 2015-09-01: 2 [IU] via SUBCUTANEOUS
  Administered 2015-09-01: 1 [IU] via SUBCUTANEOUS
  Administered 2015-09-01 – 2015-09-03 (×2): 2 [IU] via SUBCUTANEOUS
  Administered 2015-09-04 – 2015-09-05 (×3): 1 [IU] via SUBCUTANEOUS
  Administered 2015-09-05 (×2): 2 [IU] via SUBCUTANEOUS

## 2015-08-31 MED ORDER — TRAZODONE HCL 100 MG PO TABS
100.0000 mg | ORAL_TABLET | Freq: Every evening | ORAL | Status: DC | PRN
  Administered 2015-08-31 – 2015-09-15 (×16): 100 mg via ORAL
  Filled 2015-08-31 (×16): qty 1

## 2015-08-31 MED ORDER — DIPHENHYDRAMINE HCL 25 MG PO CAPS
50.0000 mg | ORAL_CAPSULE | Freq: Once | ORAL | Status: AC
  Administered 2015-08-31: 50 mg via ORAL
  Filled 2015-08-31 (×2): qty 2

## 2015-08-31 MED ORDER — IMMUNE GLOBULIN (HUMAN) 20 GM/200ML IV SOLN
100.0000 g | Freq: Once | INTRAVENOUS | Status: AC
  Administered 2015-08-31: 100 g via INTRAVENOUS
  Filled 2015-08-31: qty 1000

## 2015-08-31 MED ORDER — ACETAMINOPHEN 325 MG PO TABS
650.0000 mg | ORAL_TABLET | Freq: Once | ORAL | Status: AC
  Administered 2015-08-31: 650 mg via ORAL
  Filled 2015-08-31 (×2): qty 2

## 2015-08-31 NOTE — Progress Notes (Signed)
Subjective Pt reports some generalized weakness this AM and also dizziness.    Objective: Vital signs in last 24 hours: Filed Vitals:   08/30/15 2100 08/31/15 0100 08/31/15 0500 08/31/15 0947  BP: 111/66 97/49 110/70 90/50  Pulse: 76 52 50 57  Temp: 98.2 F (36.8 C) 98.3 F (36.8 C) 98.2 F (36.8 C) 98.1 F (36.7 C)  TempSrc: Oral Oral Oral Oral  Resp: 16 18 18 18   Height:      Weight:      SpO2: 99% 97% 98% 96%   Weight change:   Intake/Output Summary (Last 24 hours) at 08/31/15 1313 Last data filed at 08/30/15 1800  Gross per 24 hour  Intake    360 ml  Output      0 ml  Net    360 ml   General: NAD, sitting in the chair comfortably  HEENT: neg for bleeding, poor dentition.  Lungs: CTAB Cardiac: RRR, no murmurs, rubs, or gallops GI: soft, active bowel sounds, non TTP Neuro: CN II-XII grossly intact Skin: petechial rash improved on legs, chest, and abdomen.   Lab Results: Basic Metabolic Panel:  Recent Labs Lab 08/28/15 1245  NA 139  K 3.6  CL 107  CO2 27  GLUCOSE 100*  BUN 12  CREATININE 0.62  CALCIUM 9.2   Liver Function Tests:  Recent Labs Lab 08/28/15 1245  AST 20  ALT 21  ALKPHOS 47  BILITOT 0.7  PROT 7.0  ALBUMIN 3.9   CBC:  Recent Labs Lab 08/28/15 1245  08/30/15 0513 08/31/15 0644  WBC 5.8  < > 13.7* 10.6*  NEUTROABS 3.9  --   --   --   HGB 12.1  < > 10.0* 10.4*  HCT 37.1  < > 30.8* 32.2*  MCV 84.9  < > 84.6 84.3  PLT <5*  <5*  < > 11* 8*  < > = values in this interval not displayed. D-Dimer:  Recent Labs Lab 08/28/15 1245  DDIMER <0.27   Coagulation:  Recent Labs Lab 08/28/15 1245  LABPROT 15.5*  INR 1.21   Anemia Panel:  Recent Labs Lab 08/28/15 1758  RETICCTPCT 1.2    Micro Results: Recent Results (from the past 240 hour(s))  MRSA PCR Screening     Status: None   Collection Time: 08/28/15  3:37 PM  Result Value Ref Range Status   MRSA by PCR NEGATIVE NEGATIVE Final    Comment:        The  GeneXpert MRSA Assay (FDA approved for NASAL specimens only), is one component of a comprehensive MRSA colonization surveillance program. It is not intended to diagnose MRSA infection nor to guide or monitor treatment for MRSA infections. [facility]    Studies/Results: No results found. Medications: I have reviewed the patient's current medications. Scheduled Meds: . sodium chloride   Intravenous Once  . 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  Hep C neg.  Discussed updates with Dr. Mosetta PuttFeng who will see her again today.   - hemo/onc following, appreciate their recommendations, continue IV decadron 40mg  - neuro checks q4h - resume bed rest  - CBC in the morning  Dizziness, generalized weakness I wonder if these symptoms could be contributed to the IVIG or at least exacerbating? - cont to monitor - cont neuro  checks    Dispo: Disposition is deferred at this time, awaiting improvement of current medical problems.   The patient does have a current PCP (No Pcp Per Patient) and does not need an [facility] hospital follow-up appointment after discharge.   LOS: 3 days   Marrian Salvage, MD 08/31/2015, 1:13 PM

## 2015-08-31 NOTE — Progress Notes (Signed)
Subjective: No acute events overnight. Complains of dizziness and weakness.  Feels as if the room is spinning.  Headache has resolved. Denies abdominal pain  Objective: Vital signs in last 24 hours: Filed Vitals:   08/30/15 2100 08/31/15 0100 08/31/15 0500 08/31/15 0947  BP: 111/66 97/49 110/70 90/50  Pulse: 76 52 50 57  Temp: 98.2 F (36.8 C) 98.3 F (36.8 C) 98.2 F (36.8 C) 98.1 F (36.7 C)  TempSrc: Oral Oral Oral Oral  Resp: Height:      Weight:      SpO2: 99% 97% 98% 96%    Intake/Output Summary (Last 24 hours) at 08/31/15 1108 Last data filed at 08/30/15 1800  Gross per 24 hour  Intake    480 ml  Output      0 ml  Net    480 ml    Physical Exam General: No acute distress, cooperative, sitting upright in chair next to bed HEENT: no bleeding seen in oral cavity  Cardiovascular: regular rate and rhythm, no murmurs, no rubs, no gallops Pulmonary: clear to auscultation bilaterally Abdominal: non-tender, normal active bowel sounds Extremities: no pedal edema  Lab Results: Basic Metabolic Panel: CBC:  Recent Labs  08/28/15 1245  08/30/15 0513 08/31/15 0644  WBC 5.8  < > 13.7* 10.6*  NEUTROABS 3.9  --   --   --   HGB 12.1  < > 10.0* 10.4*  HCT 37.1  < > 30.8* 32.2*  MCV 84.9  < > 84.6 84.3  PLT <5*  <5*  < > 11* 8*  < > = values in this interval not displayed. CBG:  Recent Labs  08/30/15 1145 08/30/15 1629 08/30/15 2054 08/31/15 0005 08/31/15 0346 08/31/15 0759  GLUCAP 277* 217* 163* 158* 130* 116*   Micro Results: Recent Results (from the past 240 hour(s))  MRSA PCR Screening     Status: None   Collection Time: 08/28/15  3:37 PM  Result Value Ref Range Status   MRSA by PCR NEGATIVE NEGATIVE Final    Comment:        The GeneXpert MRSA Assay (FDA approved for NASAL specimens only), is one component of a comprehensive MRSA colonization surveillance program. It is not intended to diagnose MRSA infection nor to guide or monitor  treatment for MRSA infections. Performed at [facility]    Studies/Results: Ct Head Wo Contrast  08/29/2015  CLINICAL DATA:  Follow up subarachnoid hemorrhage. EXAM: CT HEAD WITHOUT CONTRAST TECHNIQUE: Contiguous axial images were obtained from the base of the skull through the vertex without intravenous contrast. COMPARISON:  August 28, 2015 FINDINGS: No subdural or epidural hemorrhage identified on today's study. The suspected subarachnoid hemorrhage on the previous study has certainly not worsened and is probably mildly improved with the most prominent region on series 2, image 22 on the right. The cerebellum, brainstem, and basal cisterns are normal. Ventricles and sulci are normal. No acute cortical ischemia or infarct. No mass, mass effect, or midline shift. IMPRESSION: The previously noted subarachnoid hemorrhage is improved in the interval. Recommend continued attention on follow-up. The findings of subarachnoid hemorrhage were called at the time of the previous study. The clinical team is aware. Electronically Signed   By: Gerome Sam III M.D   On: 08/29/2015 13:23   Medications:  Scheduled Meds: . sodium chloride   Intravenous Once  . insulin aspart  0-9 Units Subcutaneous Q4H   Continuous Infusions:  PRN Meds:.acetaminophen, traMADol  Problem  List Active Problems:   Acute ITP [facility](HCC)   Assessment/Plan: [name] is a 40 year old spanish speaking woman with no significant PMH that presented with active acute bleeding from gums who was found to have severe thrombocytopenia most likely due to acute ITP.  Acute ITP--improving: Plts 8K today (11K yesterday). WBC 10.6 on high dose steroids. Will place patient back on strict bed rest as patient's risk of spontaneous bleeding is increased now that plt count is less than 10K.Findings discussed with Heme-Onc (Dr. Malachy MoodYan Feng) who will see her today.  -- continue dexamethasone 40mg  (Day 4 of 4) -- place on  bed rest -- r/p CBC in AM  Subarachnoid Hemorrhage:  R/p CT showed no worsening of SAH and mild improvement. Headache resolved  -- neuro checks 24h  -- Tramodol 50mg  PRN  Steroid Induced Hyperglycemia: Blood glucose averaging in the 130s on high dose steroids. -- Sliding scale (Novolog) -- Glucose checks q4h  This is a Psychologist, occupationalMedical Student Note.  The care of the patient was discussed with Dr. Delane GingerGill and the assessment and plan formulated with their assistance.  Please see their attached note for official documentation of the daily encounter.   LOS: 3 days   Danelle Earthlyaryl Ayona Yniguez, Med Student 08/31/2015, 11:08 AM

## 2015-08-31 NOTE — Evaluation (Signed)
Physical Therapy Evaluation Patient Details Name: [name] MRN: [id number] DOB: 01-18-1976 Today's Date: 08/31/2015   History of Present Illness  [name] is a 40yo woman with no significant PMH who presents with gingival bleeding which she could not control. Apparently in the ED waiting room, she also developed a petechial rash on her LE, pannus and neck. Her platelet count was noted to be zero in the ED. CT head showed a subarrachnoid hemorrhage, and she complained of headache.Pt to have been diagnosed with thrombocytopenia  Clinical Impression  Pt functioning near baseline but con't to report "room spinning" or "things moving" even in supine/lying in bed. Ambulation limited by dizziness. BP 104/55 in sitting EOB, 95/54 s/p PT treatment. RN aware.  Pt with good home set up and family support. Acute PT to follow.    Follow Up Recommendations No PT follow up;Supervision/Assistance - 24 hour    Equipment Recommendations  None recommended by PT    Recommendations for Other Services       Precautions / Restrictions Precautions Precautions: Fall Precaution Comments: dizzines/room spinning even in supine Restrictions Weight Bearing Restrictions: No      Mobility  Bed Mobility Overal bed mobility: Modified Independent             General bed mobility comments: hob flat, used bed rail, increased time due to body habitus  Transfers Overall transfer level: Needs assistance Equipment used: None Transfers: Sit to/from Stand Sit to Stand: Min guard         General transfer comment: no evidence of instability  Ambulation/Gait Ambulation/Gait assistance: Min guard Ambulation Distance (Feet): 20 Feet (within room, to/from bathroom and sink) Assistive device: None Gait Pattern/deviations: Step-through pattern;Decreased stride length Gait velocity: decreased Gait velocity interpretation: Below normal speed for age/gender General Gait Details: pt c/o  "room spinnin" however pt not guarding or holding onto walls/furniture. 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 pt also stood at sink to wash hands and brush teeth and showed no signs of instability               High Level Balance Comments: pt able to stand on L LE x 5 seconds to raise R LE to don menstration pad             Pertinent Vitals/Pain Pain Assessment: 0-10 Pain Score: 3  Pain Location: headache Pain Descriptors / Indicators: Aching Pain Intervention(s): Monitored during session    Home Living Family/patient expects to be discharged to:: Private residence Living Arrangements: Spouse/significant other;Children (has 5 kids, 3-18yo) Available Help at Discharge: Family Type of Home: House ([place]) Home Access: Stairs to enter Entrance Stairs-Rails: None Secretary/administrator of Steps: 1 Home Layout: One level Home Equipment: None      Prior Function Level of Independence: Independent         Comments: cooked, cleaned, drove, managed house of 7     Hand Dominance   Dominant Hand: Right  Extremity/Trunk Assessment   Upper Extremity Assessment: Overall WFL for tasks assessed           Lower Extremity Assessment: Overall WFL for tasks assessed      Cervical / Trunk Assessment: Normal  Communication   Communication: Prefers language other than  English  Cognition Arousal/Alertness: Awake/alert Behavior During Therapy: WFL for tasks assessed/performed Overall Cognitive Status: Within Functional Limits for tasks assessed                      General Comments General comments (skin integrity, edema, etc.): pt menstrating    Exercises        Assessment/Plan    PT Assessment Patient needs continued PT services  PT Diagnosis Difficulty walking;Generalized weakness   PT Problem List Decreased strength;Decreased activity tolerance;Decreased balance;Decreased mobility  PT Treatment Interventions Gait training;Stair training;Functional mobility training;Therapeutic activities;Therapeutic exercise;Balance training;Neuromuscular re-education;DME instruction   PT Goals (Current goals can be found in the Care Plan section) Acute Rehab PT Goals Patient Stated Goal: home PT Goal Formulation: With patient/family Time For Goal Achievement: 09/07/15 Potential to Achieve Goals: Good    Frequency Min 3X/week   Barriers to discharge        Co-evaluation               End of Session Equipment Utilized During Treatment: Gait belt Activity Tolerance:  (limited by dizziness) Patient left: in chair;with call bell/phone within reach;with chair alarm set;with family/visitor present;with nursing/sitter in room Nurse Communication: Mobility status (BP soft)         Time: [id number] PT Time Calculation (min) (ACUTE ONLY): 28 min   Charges:   PT Evaluation $PT Eval Low Complexity: 1 Procedure PT Treatments $Therapeutic Activity: 8-22 mins   PT G CodesMarcene Brawn:        [name] 08/31/2015, 8:02 AM   Lewis ShockAshly Gola Holland, PT, DPT Pager #: [phone] Office #: [phone]

## 2015-08-31 NOTE — Progress Notes (Addendum)
[name]   DOB:06/12/1975   UJ#:[id number]#:[id number]   NFA#:[id number]#:[id number]  HEMATOLOGY SERVICE   Subjective: Pt doing better overall, headaches improved some, plt 11k yesterday and 8K this morning, no bleeding.   Objective:  Filed Vitals:   08/31/15 1341 08/31/15 1811  BP: 103/60 101/54  Pulse: 59 55  Temp: 98.9 F (37.2 C) 98.7 F (37.1 C)  Resp: 18 18    Body mass index is 42.93 kg/(m^2). No intake or output data in the 24 hours ending 08/31/15 1821   Sclerae unicteric  Oropharynx clear, (+) multiple small bleeding spots in mucosa, and old blood around her gum  No peripheral adenopathy  Lungs clear -- no rales or rhonchi  Heart regular rate and rhythm  Abdomen benign  MSK no focal spinal tenderness, no peripheral edema  Neuro nonfocal  Skin: (+) diffuse petechia on her trunk and extremities, there is a small bruise on the left side abdomen  without blister, no rashes or other significant lesions  CBG (last 3)   Recent Labs  08/31/15 0759 08/31/15 1125 08/31/15 1620  GLUCAP 116* 137* 237*     Labs:  Lab Results  Component Value Date   WBC 10.6* 08/31/2015   HGB 10.4* 08/31/2015   HCT 32.2* 08/31/2015   MCV 84.3 08/31/2015   PLT 8* 08/31/2015   NEUTROABS 3.9 08/28/2015    @LASTCHEMISTRY @  Urine Studies No results for input(s): UHGB, CRYS in the last 72 hours.  Invalid input(s): UACOL, UAPR, USPG, UPH, UTP, UGL, UKET, UBIL, UNIT, UROB, ULEU, UEPI, UWBC, URBC, UBAC, CAST, UCOM, BILUA  Basic Metabolic Panel:  Recent Labs Lab 08/28/15 1245  NA 139  K 3.6  CL 107  CO2 27  GLUCOSE 100*  BUN 12  CREATININE 0.62  CALCIUM 9.2   GFR Estimated Creatinine Clearance: 103.2 mL/min (by C-G formula based on Cr of 0.62). Liver Function Tests:  Recent Labs Lab 08/28/15 1245  AST 20  ALT 21  ALKPHOS 47  BILITOT 0.7  PROT 7.0  ALBUMIN 3.9   No results for input(s): LIPASE, AMYLASE in the last 168 hours. No results for input(s): AMMONIA in the last 168  hours. Coagulation profile  Recent Labs Lab 08/28/15 1245  INR 1.21    CBC:  Recent Labs Lab 08/28/15 1245 08/29/15 0448 08/30/15 0513 08/31/15 0644  WBC 5.8 7.8 13.7* 10.6*  NEUTROABS 3.9  --   --   --   HGB 12.1 11.1* 10.0* 10.4*  HCT 37.1 33.8* 30.8* 32.2*  MCV 84.9 84.1 84.6 84.3  PLT <5*  <5* 6* 11* 8*   Cardiac Enzymes: No results for input(s): CKTOTAL, CKMB, CKMBINDEX, TROPONINI in the last 168 hours. BNP: Invalid input(s): POCBNP CBG:  Recent Labs Lab 08/31/15 0005 08/31/15 0346 08/31/15 0759 08/31/15 1125 08/31/15 1620  GLUCAP 158* 130* 116* 137* 237*   D-Dimer No results for input(s): DDIMER in the last 72 hours. Hgb A1c No results for input(s): HGBA1C in the last 72 hours. Lipid Profile No results for input(s): CHOL, HDL, LDLCALC, TRIG, CHOLHDL, LDLDIRECT in the last 72 hours. Thyroid function studies No results for input(s): TSH, T4TOTAL, T3FREE, THYROIDAB in the last 72 hours.  Invalid input(s): FREET3 Anemia work up No results for input(s): VITAMINB12, FOLATE, FERRITIN, TIBC, IRON, RETICCTPCT in the last 72 hours. Microbiology Recent Results (from the past 240 hour(s))  MRSA PCR Screening     Status: None   Collection Time: 08/28/15  3:37 PM  Result Value Ref Range Status  MRSA by PCR NEGATIVE NEGATIVE Final    Comment:        The GeneXpert MRSA Assay (FDA approved for NASAL specimens only), is one component of a comprehensive MRSA colonization surveillance program. It is not intended to diagnose MRSA infection nor to guide or monitor treatment for MRSA infections. Performed at [facility] [place]       Studies:  No results found.  Assessment and plan: 40 y.o. Spanish-speaking female, presented with sudden onset gum bleeding, CBC showed platelet<5, normal WBC, hemoglobin, DIC panel was negative  1. Severe thrombocytopenia, likely ITP -She has received IVIG 1 g/kg X2, dexamethasone 40 mg daily, day 4 today, and a  1 unit of platelet on admission -Her platelet has slightly improved, still very low  -she is finishing dexa today, plt likely will improve in the next few days -If plt still very low, we can consider second line therapy, such as rituximab  -I will be out off from 6/14 to 6/25, Dr. Rosezena Sensor has kindly agreed to follow him in house and will arrange follow up with me or him after discharge.   2. Headache and SAH -repeated CT head showed improved SAH  -continue nero check and follow up  3 anemia -developed after admission -no alb evidence of hemolysis  -will follow   I discussed with resident Dr. Delane Ginger this morning.    Malachy Mood, MD 08/31/2015  6:21 PM

## 2015-08-31 NOTE — Progress Notes (Signed)
Interpreter Graciela Namihira for patient °

## 2015-09-01 DIAGNOSIS — R42 Dizziness and giddiness: Secondary | ICD-10-CM

## 2015-09-01 DIAGNOSIS — R531 Weakness: Secondary | ICD-10-CM

## 2015-09-01 LAB — TYPE AND SCREEN
ABO/RH(D): O POS
ANTIBODY SCREEN: NEGATIVE
UNIT DIVISION: 0
UNIT DIVISION: 0

## 2015-09-01 LAB — CBC
HEMATOCRIT: 32.8 % — AB (ref 36.0–46.0)
HEMOGLOBIN: 11.1 g/dL — AB (ref 12.0–15.0)
MCH: 27.8 pg (ref 26.0–34.0)
MCHC: 33.8 g/dL (ref 30.0–36.0)
MCV: 82.2 fL (ref 78.0–100.0)
Platelets: <5 10*3/uL — CL (ref 150–400)
RBC: 3.99 MIL/uL (ref 3.87–5.11)
RDW: 13.3 % (ref 11.5–15.5)
WBC: 9.8 10*3/uL (ref 4.0–10.5)

## 2015-09-01 LAB — GLUCOSE, CAPILLARY
GLUCOSE-CAPILLARY: 133 mg/dL — AB (ref 65–99)
GLUCOSE-CAPILLARY: 177 mg/dL — AB (ref 65–99)
GLUCOSE-CAPILLARY: 144 mg/dL — AB (ref 65–99)
Glucose-Capillary: 168 mg/dL — ABNORMAL HIGH (ref 65–99)
Glucose-Capillary: 163 mg/dL — ABNORMAL HIGH (ref 65–99)

## 2015-09-01 MED ORDER — MECLIZINE HCL 12.5 MG PO TABS: 12.5000 mg | ORAL_TABLET | Freq: Three times a day (TID) | ORAL | Status: DC | PRN

## 2015-09-01 NOTE — Progress Notes (Signed)
  Subjective: No acute events overnight. Complains of headache and dizziness this morning that resolved with PRN Tramadol.  Denies abdominal pain and changes in bowel movement.   Objective: Vital signs in last 24 hours: Filed Vitals:   08/31/15 2325 09/01/15 0025 09/01/15 0125 09/01/15 0511  BP: 116/57 114/65 119/67 109/58  Pulse: 42 44 54 52  Temp: 98.8 F (37.1 C) 98.7 F (37.1 C) 98.7 F (37.1 C) 98.3 F (36.8 C)  TempSrc: Oral Oral Oral Oral  Resp: 16 16 16 18   Height:      Weight:      SpO2: 96% 99% 96% 97%   No intake or output data in the 24 hours ending 09/01/15 0854  Physical Exam General: No acute distress, cooperative, laying flat in bed HEENT: no bleeding seen in oral cavity  Cardiovascular: regular rate and rhythm, no murmurs, no rubs, no gallops Pulmonary: clear to auscultation bilaterally Abdominal: non-tender, normal active bowel sounds Extremities: no pedal edema Skin:  Petechiae resolving on trunk and lower extremities Neuro: CNII-XII grossly intact  Lab Results: Basic Metabolic Panel: CBC:  Recent Labs  08/31/15 0644 09/01/15 0551  WBC 10.6* 9.8  HGB 10.4* 11.1*  HCT 32.2* 32.8*  MCV 84.3 82.2  PLT 8* <5*   CBG:  Recent Labs  08/31/15 0005 08/31/15 0346 08/31/15 0759 08/31/15 1125 08/31/15 1620 08/31/15 1947  GLUCAP 158* 130* 116* 137* 237* 253*   Medications:  Scheduled Meds: . sodium chloride   Intravenous Once  . insulin aspart  0-9 Units Subcutaneous TID AC   Continuous Infusions:  PRN Meds:.acetaminophen, meclizine, traMADol, traZODone  Problem List Active Problems:   Acute ITP (HCC)   Assessment/Plan: [name] is a 40 year old spanish speaking woman with no significant PMH that presented with active acute bleeding from gums who was found to have severe thrombocytopenia most likely due to acute ITP.  Acute ITP: Plts <5K today (8K yesterday).  Hemoglobin is stable. Patient is on strict bed rest as patient's  risk of spontaneous bleeding is increased now that plt count is less than 10K. Patient received last dose of dexamethasone and another dose of IVIg yesterday.  Heme-Onc (Dr. Cyndie ChimeGranfortuna) following.  -- r/p CBC in AM -- Wait 3-4 days to asses her response to treatment per Heme     Subarachnoid Hemorrhage:  Patient continues to have recurrent headache that improves with PRN Tramadol.  Headache concerning as she is at high risk of spontaneous bleed being that are platelets are <5K.  Recurrent headache could also be a side effect of IVIg treatment.  -- neuro checks 24h  -- Tramodol 50mg  PRN -- consider r/p CT in the morning  Steroid Induced Hyperglycemia: Blood glucose averaging in the upper 100s after treatment with high dose steroids. Received last dose yesterday.   -- Sliding scale (Novolog) -- Glucose checks q4h  This is a Psychologist, occupationalMedical Student Note.  The care of the patient was discussed with Dr. Danella Pentonruong and the assessment and plan formulated with their assistance.  Please see their attached note for official documentation of the daily encounter.   LOS: 4 days   Danelle Earthlyaryl Anasha Perfecto, Med Student 09/01/2015, 8:54 AM

## 2015-09-01 NOTE — Progress Notes (Signed)
Patient ID: [name], female   DOB: 06/15/1975, 40 y.o.   MRN: [id number] Hematology Addendum: Patient's daughter here and assists with translation. No history of hepatitis, yellow jaundice, mononucleosis. No recent infection; no alcohol She was taking an over the counter herbal supplement "herbalife" to help lose weight. Exam: Blood pressure 99/52, pulse 56, temperature 98.5 F (36.9 C), temperature source Oral, resp. rate 18, height 5\' 1"  (1.549 m), weight 227 lb 1.2 oz (103 kg), last menstrual period 08/23/2015, SpO2 96 %, currently breastfeeding.  Obese, NAD No adenopathy No obvious organomegaly PERRL, full EOMs,motor strength 5/5, DTRs 1+ symmetric Impression: ITP  Plan:  see earlier note. Diagnosis, current Rx plan and future Rx options discussed w pt & daughter.

## 2015-09-01 NOTE — Progress Notes (Signed)
Patient ID: [name], female   DOB: 06/19/1975, 40 y.o.   MRN: [id number] Hematology: Platelets remain low - confirmed by review of peripheral blood; no other abnormalities except decreased platelets.  She received second dose of IVIG yesterday. Hemoglobin stable.  I would allow 72-96 hours to assess for response. Re-evaluate at that time for splenectomy vs a trial of Rituxan. Lack of response to steroids/IVIG does not predict lack of response to splenectomy. If splenectomy is chosen, however, a short term trial of Romiplostim (N-Plate), weekly SQ thrombopoietin receptor agonist, should get platelets in acceptable range to proceed with surgery. Usually take 2 doses before seeing an effect.   Cephas DarbyJames Azusena Erlandson, MD, FACP  Hematology-Oncology/Internal Medicine

## 2015-09-01 NOTE — Progress Notes (Signed)
Subjective Pt reports blurry vision, denies double vision. States that the letters are moving.   Objective: Vital signs in last 24 hours: Filed Vitals:   08/31/15 2325 09/01/15 0025 09/01/15 0125 09/01/15 0511  BP: 116/57 114/65 119/67 109/58  Pulse: 42 44 54 52  Temp: 98.8 F (37.1 C) 98.7 F (37.1 C) 98.7 F (37.1 C) 98.3 F (36.8 C)  TempSrc: Oral Oral Oral Oral  Resp: Height:      Weight:      SpO2: 96% 99% 96% 97%   Weight change:   Intake/Output Summary (Last 24 hours) at 09/01/15 1004 Last data filed at 09/01/15 0900  Gross per 24 hour  Intake    240 ml  Output      0 ml  Net    240 ml   General: NAD, laying in bed HEENT: neg for bleeding, poor dentition. Moist mucous membranes, PERRLA, EOMI, able to read cafe menu 1 foot in front of her Lungs: CTAB, no wheezing Cardiac: RRR, no murmurs, rubs, or gallops GI: soft, active bowel sounds, non TTP Neuro: CN II-XII grossly intact Skin: warm and dry  Lab Results: Basic Metabolic Panel:  Recent Labs Lab 08/28/15 1245  NA 139  K 3.6  CL 107  CO2 27  GLUCOSE 100*  BUN 12  CREATININE 0.62  CALCIUM 9.2   Liver Function Tests:  Recent Labs Lab 08/28/15 1245  AST 20  ALT 21  ALKPHOS 47  BILITOT 0.7  PROT 7.0  ALBUMIN 3.9   CBC:  Recent Labs Lab 08/28/15 1245  08/31/15 0644 09/01/15 0551  WBC 5.8  < > 10.6* 9.8  NEUTROABS 3.9  --   --   --   HGB 12.1  < > 10.4* 11.1*  HCT 37.1  < > 32.2* 32.8*  MCV 84.9  < > 84.3 82.2  PLT <5*  <5*  < > 8* <5*  < > = values in this interval not displayed. D-Dimer:  Recent Labs Lab 08/28/15 1245  DDIMER <0.27   Coagulation:  Recent Labs Lab 08/28/15 1245  LABPROT 15.5*  INR 1.21   Anemia Panel:  Recent Labs Lab 08/28/15 1758  RETICCTPCT 1.2    Micro Results: Recent Results (from the past 240 hour(s))  MRSA PCR Screening     Status: None   Collection Time: 08/28/15  3:37 PM  Result Value Ref Range Status   MRSA by PCR  NEGATIVE NEGATIVE Final    Comment:        The GeneXpert MRSA Assay (FDA approved for NASAL specimens only), is one component of a comprehensive MRSA colonization surveillance program. It is not intended to diagnose MRSA infection nor to guide or monitor treatment for MRSA infections. Performed at [facility] [place]    Studies/Results: No results found. Medications: I have reviewed the patient's current medications. Scheduled Meds: . sodium chloride   Intravenous Once  . 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 If no improvement can consider splenectomy vs rituxan - neuro checks q4h - resume bed rest  - CBC in the morning  Dizziness, generalized weakness-- could be SE of medications she is receiving this admission.  - cont to monitor - cont neuro checks  - hold off  on antivert incase it masks any neuro sx   Dispo: Disposition is deferred at this time, awaiting improvement of current medical problems.   The patient does have a current PCP (No Pcp Per Patient) and [facility] after discharge.   LOS: 4 days   Denton Brickiana M Ayssa Bentivegna, MD 09/01/2015, 10:04 AM

## 2015-09-02 LAB — CBC WITH DIFFERENTIAL/PLATELET
BASOS ABS: 0 10*3/uL (ref 0.0–0.1)
BASOS PCT: 0 %
Eosinophils Absolute: 0 10*3/uL (ref 0.0–0.7)
Eosinophils Relative: 0 %
HEMATOCRIT: 39.1 % (ref 36.0–46.0)
Hemoglobin: 12.9 g/dL (ref 12.0–15.0)
Lymphocytes Relative: 37 %
Lymphs Abs: 3.2 10*3/uL (ref 0.7–4.0)
MCH: 27.7 pg (ref 26.0–34.0)
MCHC: 33 g/dL (ref 30.0–36.0)
MCV: 84.1 fL (ref 78.0–100.0)
MONO ABS: 0.5 10*3/uL (ref 0.1–1.0)
Monocytes Relative: 6 %
NEUTROS ABS: 4.9 10*3/uL (ref 1.7–7.7)
NEUTROS PCT: 57 %
Platelets: <5 10*3/uL — CL (ref 150–400)
RBC: 4.65 MIL/uL (ref 3.87–5.11)
RDW: 13.5 % (ref 11.5–15.5)
WBC: 8.6 10*3/uL (ref 4.0–10.5)

## 2015-09-02 LAB — GLUCOSE, CAPILLARY
Glucose-Capillary: 117 mg/dL — ABNORMAL HIGH (ref 65–99)
Glucose-Capillary: 101 mg/dL — ABNORMAL HIGH (ref 65–99)
Glucose-Capillary: 107 mg/dL — ABNORMAL HIGH (ref 65–99)
Glucose-Capillary: 113 mg/dL — ABNORMAL HIGH (ref 65–99)

## 2015-09-02 MED ORDER — AMINOCAPROIC ACID 500 MG PO TABS
2.0000 g | ORAL_TABLET | Freq: Four times a day (QID) | ORAL | Status: DC
  Administered 2015-09-02 – 2015-09-09 (×28): 2 g via ORAL
  Filled 2015-09-02 (×33): qty 4

## 2015-09-02 MED ORDER — DIPHENHYDRAMINE HCL 50 MG/ML IJ SOLN
25.0000 mg | Freq: Once | INTRAMUSCULAR | Status: AC
  Administered 2015-09-03: 25 mg via INTRAVENOUS
  Filled 2015-09-02: qty 1

## 2015-09-02 MED ORDER — ROMIPLOSTIM 250 MCG ~~LOC~~ SOLR
100.0000 ug | Freq: Once | SUBCUTANEOUS | Status: AC
  Administered 2015-09-03: 100 ug via SUBCUTANEOUS
  Filled 2015-09-02 (×2): qty 0.2

## 2015-09-02 MED ORDER — ACETAMINOPHEN 325 MG PO TABS
650.0000 mg | ORAL_TABLET | Freq: Once | ORAL | Status: AC
  Administered 2015-09-03: 650 mg via ORAL
  Filled 2015-09-02: qty 2

## 2015-09-02 MED ORDER — SODIUM CHLORIDE 0.9 % IV SOLN
375.0000 mg/m2 | Freq: Once | INTRAVENOUS | Status: AC
  Administered 2015-09-03: 800 mg via INTRAVENOUS
  Filled 2015-09-02: qty 80

## 2015-09-02 NOTE — Progress Notes (Signed)
   09/02/15 1345  Clinical Encounter Type  Visited With Patient and family together  Visit Type Initial  Spiritual Encounters  Spiritual Needs Prayer  Stress Factors  Patient Stress Factors Not reviewed  Family Stress Factors Not reviewed  Chaplain made several visits to patient before a successful contact was made.  Patient had a interpreter present.  Chaplain offered prayer via interpreter.  Chaplain shared further spiritual care available if needed.

## 2015-09-02 NOTE — Progress Notes (Signed)
Physical Therapy Treatment Patient Details Name: [name] MRN: [id number] DOB: 05/12/1975 Today's Date: 09/02/2015    History of Present Illness [name] is a 40yo woman with no significant PMH who presents with gingival bleeding which she could not control. Apparently in the ED waiting room, she also developed a petechial rash on her LE, pannus and neck. Her platelet count was noted to be zero in the ED. CT head showed a subarrachnoid hemorrhage, and she complained of headache.Pt to have been diagnosed with thrombocytopenia    PT Comments    Pt performed decreased mobility and limited to bed mobility and therapeutic exercise.  Pt currently presents with soft BPs, + dizziness and low platelet count.  RN informed PTA of possible transfer to step down unit.  Will require follow up from supervising PT if transfer is made.  Will continue efforts to progress mobility pending patients lab and vitals.    Follow Up Recommendations  No PT follow up;Supervision/Assistance - 24 hour     Equipment Recommendations  None recommended by PT    Recommendations for Other Services       Precautions / Restrictions Precautions Precautions: Fall Precaution Comments: dizzines/room spinning even in supine Restrictions Weight Bearing Restrictions: No    Mobility  Bed Mobility Overal bed mobility: Modified Independent             General bed mobility comments: hob flat, used bed rail, increased time due to body habitus. Practiced transferring at slower rate, propping on elbow to decrease dizziness.  Pt sat EOB x 4 min before c/o increased dizziness and needing to lie down.    Transfers Overall transfer level:  (deferred transfers due to soft BP and decreased platelet count.  )                  Ambulation/Gait                 Stairs            Wheelchair Mobility    Modified Rankin (Stroke Patients Only)       Balance     Sitting  balance-Leahy Scale: Good                              Cognition Arousal/Alertness: Awake/alert Behavior During Therapy: WFL for tasks assessed/performed Overall Cognitive Status: Within Functional Limits for tasks assessed                      Exercises General Exercises - Lower Extremity Straight Leg Raises: AROM;Both;20 reps Low Level/ICU Exercises Ankle Circles/Pumps: AROM;Both;20 reps;Supine Quad Sets: AROM;Both;20 reps;Supine Hip ABduction/ADduction: AROM;Both;10 reps;Supine Heel Slides: AROM;Both;20 reps;Supine Stabilized Bridging: AROM;Both;20 reps (2x10 reps.  )    General Comments        Pertinent Vitals/Pain Pain Assessment: 0-10 Pain Score: 5  Pain Location: HA Pain Descriptors / Indicators: Aching;Grimacing;Discomfort Pain Intervention(s): Monitored during session;Repositioned    Home Living                      Prior Function            PT Goals (current goals can now be found in the care plan section) Acute Rehab PT Goals Patient Stated Goal: home Potential to Achieve Goals: Good Progress towards PT goals: Progressing toward goals    Frequency  Min 3X/week    PT Plan Current plan remains appropriate  Co-evaluation             End of Session   Activity Tolerance:  (limited by dizziness , low BP and extremely low platelet count.  ) Patient left: with call bell/phone within reach;with family/visitor present;with nursing/sitter in room;in bed     Time: 1419-1440 PT Time Calculation (min) (ACUTE ONLY): 21 min  Charges:  $Therapeutic Exercise: 8-22 mins                    G Codes:      Florestine Avers 08-Sep-2015, 2:47 PM Joycelyn Rua, PTA pager [phone]

## 2015-09-02 NOTE — Progress Notes (Signed)
Subjective: Continues to complain of dizziness.  Headache is well controlled. Denies any bleeding  Objective: Vital signs in last 24 hours: Filed Vitals:   09/01/15 1740 09/01/15 2155 09/02/15 0133 09/02/15 0527  BP: 105/61 104/59 114/68 100/63  Pulse: 53 54 57 56  Temp: 98.5 F (36.9 C) 98.4 F (36.9 C) 98.3 F (36.8 C) 98.2 F (36.8 C)  TempSrc: Oral Oral Oral Oral  Resp: 18 17 16 17   Height:      Weight:      SpO2: 97% 98% 99% 98%   Weight change:   Intake/Output Summary (Last 24 hours) at 09/02/15 0914 Last data filed at 09/01/15 1700  Gross per 24 hour  Intake    540 ml  Output      0 ml  Net    540 ml    Physical Exam General: laying in bed, no acute distress Cardiovascular:  Regular rate and rhythm, no murmurs, no rubs, no gallops, good pulses bilaterally Pulmonary: clear to ausculation bilaterally Abdominal:  Obese abdomen, non-tender, normal active bowel sounds,  resolving petichiae  Extermities:  No peripheral edema   Lab Results:  Recent Labs  09/01/15 0551 09/02/15 0520  WBC 9.8 8.6  NEUTROABS  --  4.9  HGB 11.1* 12.9  HCT 32.8* 39.1  MCV 82.2 84.1  PLT <5* <5*   CBG:  Recent Labs  09/01/15 0602 09/01/15 0814 09/01/15 1118 09/01/15 1624 09/01/15 2227 09/02/15 0625  GLUCAP 168* 133* 177* 163* 144* 117*   Micro Results: Recent Results (from the past 240 hour(s))  MRSA PCR Screening     Status: None   Collection Time: 08/28/15  3:37 PM  Result Value Ref Range Status   MRSA by PCR NEGATIVE NEGATIVE Final    Comment:        The GeneXpert MRSA Assay (FDA approved for NASAL specimens only), is one component of a comprehensive MRSA colonization surveillance program. It is not intended to diagnose MRSA infection nor to guide or monitor treatment for MRSA infections. [facility]formed [facility]    Studies/Results: No results found. Medications:  Scheduled Meds: . sodium chloride   Intravenous Once  .  aminocaproic acid  2 g Oral Q6H  . insulin aspart  0-9 Units Subcutaneous TID AC   Continuous Infusions:  PRN Meds:.acetaminophen, traMADol, traZODone  Problem List Active Problems:   Acute ITP (HCC)   Assessment/Plan: [name] is a 40 year old spanish speaking woman with no significant PMH that presented with active acute bleeding from gums who was found to have severe thrombocytopenia most likely due to acute ITP.  Acute ITP: Plts <5K today (<5K yesterday). Hemoglobin is stable. Patient has previously been on bed rest but plan to allow patient up with assistance to decrease risk of blood clots in legs.   Heme-Onc (Dr. Cyndie ChimeGranfortuna) following.  Plan to begin AMICAR today and begin thrombopoietin and rituximab treatment tomorrow per recommendations.  Patient will need prophylactic vaccinations in case splenectomy is needed but will hold for now as IM injections increase risk of hematoma formation.  If spontanous bleed occurs recommended to give rrecombinant factor 7a and 1 unit of platelets. -- r/p CBC in AM -- Out of bed with assistance.  -- Begin AMICAR today to decrease risk of bleeding from mucosal surfaces per Heme -- Begin Thrombopoietin and rituximab tomorrow   Subarachnoid Hemorrhage:Continues to have dizziness. No focal neurological deficits exhibited by patient but plan to closely monitor as she is at high  risk for spontaneous bleed.  -- neuro checks 24h  -- Tramodol  PRN -- consider r/p CT  Steroid Induced Hyperglycemia: Blood glucose averaging in the upper 180s after treatment with high dose steroids. Last dose received on 08/31/15 -- Sliding scale (Novolog) -- Glucose checks q4h  This is a Psychologist, occupational Note.  The care of the patient was discussed with Dr.Truong and the assessment and plan formulated with their assistance.  Please see their attached note for official documentation of the daily encounter.   LOS: 5 days   Danelle Earthly, Med  Student 09/02/2015, 9:14 AM

## 2015-09-02 NOTE — Progress Notes (Signed)
Pt being transferred to 3S14 per orders from MD. Pt and family informed of transfer and [facility], [facility]. Pt transferred via bed.

## 2015-09-02 NOTE — Progress Notes (Signed)
Subjective Pt having dizziness at rest and w/ movement that has improved. She had nausea last night w/o vomiting, no longer feeling nauseous this morning.   Objective: Vital signs in last 24 hours: Filed Vitals:   09/01/15 1740 09/01/15 2155 09/02/15 0133 09/02/15 0527  BP: 105/61 104/59 114/68 100/63  Pulse: 53 54 57 56  Temp: 98.5 F (36.9 C) 98.4 F (36.9 C) 98.3 F (36.8 C) 98.2 F (36.8 C)  TempSrc: Oral Oral Oral Oral  Resp: 18 17 16 17   Height:      Weight:      SpO2: 97% 98% 99% 98%   Weight change:   Intake/Output Summary (Last 24 hours) at 09/02/15 0859 Last data filed at 09/01/15 1700  Gross per 24 hour  Intake    780 ml  Output      0 ml  Net    780 ml   General: NAD, laying in bed HEENT: neg for bleeding, poor dentition. Moist mucous membranes.  Lungs: CTAB, no wheezing or crackles Cardiac: RRR, no murmurs, rubs, or gallops GI: soft, active bowel sounds, non TTP Neuro: CN II-XII grossly intact Skin: warm and dry  Lab Results: Basic Metabolic Panel:  Recent Labs Lab 08/28/15 1245  NA 139  K 3.6  CL 107  CO2 27  GLUCOSE 100*  BUN 12  CREATININE 0.62  CALCIUM 9.2   Liver Function Tests:  Recent Labs Lab 08/28/15 1245  AST 20  ALT 21  ALKPHOS 47  BILITOT 0.7  PROT 7.0  ALBUMIN 3.9   CBC:  Recent Labs Lab 08/28/15 1245  09/01/15 0551 09/02/15 0520  WBC 5.8  < > 9.8 8.6  NEUTROABS 3.9  --   --  4.9  HGB 12.1  < > 11.1* 12.9  HCT 37.1  < > 32.8* 39.1  MCV 84.9  < > 82.2 84.1  PLT <5*  <5*  < > <5* <5*  < > = values in this interval not displayed. D-Dimer:  Recent Labs Lab 08/28/15 1245  DDIMER <0.27   Coagulation:  Recent Labs Lab 08/28/15 1245  LABPROT 15.5*  INR 1.21   Anemia Panel:  Recent Labs Lab 08/28/15 1758  RETICCTPCT 1.2    Micro Results: Recent Results (from the past 240 hour(s))  MRSA PCR Screening     Status: None   Collection Time: 08/28/15  3:37 PM  Result Value Ref Range Status   MRSA  by PCR NEGATIVE NEGATIVE Final    Comment:        The GeneXpert MRSA Assay (FDA approved for NASAL specimens only), is one component of a comprehensive MRSA colonization surveillance program. It is not intended to diagnose MRSA infection nor to guide or monitor treatment for MRSA infections. Performed at [facility]    Studies/Results: No results found. Medications: I have reviewed the patient's current medications. Scheduled Meds: . sodium chloride   Intravenous Once  . aminocaproic acid  2 g Oral Q6H  . 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 Will need to consider splenectomy as plts have been refractory to IVIG and high dose steroids. Will need prophylactic vaccinations for encapsulated organism prior splenectomy.  - neuro checks q4h, if any neuro deficits are noted will need  emergent transfusion of 1 unit of plts and recombinant VIIa (dosed at 60units/kg) - out of bed with assistance, SCDs  - CBC in the morning   Dizziness, generalized weakness-- could be SE of medications she is receiving this admission.  - cont to monitor - cont neuro checks  - hold off on antivert incase it masks any neuro sx   Dispo: Disposition is deferred at this time, awaiting improvement of current medical problems.   The patient does have a current PCP (No Pcp Per Patient) and does not need an [facility] hospital follow-up appointment after discharge.   LOS: 5 days   Denton Brick, MD 09/02/2015, 8:59 AM

## 2015-09-02 NOTE — Progress Notes (Signed)
Dr. Cyndie ChimeGranfortuna called main pharmacy regarding this patient with ITP not responding to dexamenthasone and IVIG.  PLTC remains < 5.   He has ordered romiplastin 100mcg sq x1 - [facility].   He has ordered rituxin 375mg /m2 dose  = 800mg .  [facility] will [facility] delevered [facility].  A pharmacy IV tech will mix here on campus in am for afternoon administration.  I have placed an IV team consult on EPIC to hang rituxan tomorrow and called IV team to ensure they will have staff on site tomorrow to hang medication.  They are to call back to confirm staffing.    Leota SauersLisa Shamiracle Gorden Pharm.D. CPP, BCPS Clinical Pharmacist [phone] 09/02/2015 4:52 PM

## 2015-09-02 NOTE — Care Management Note (Signed)
Case Management Note  Patient Details  Name: [name] MRN: [id number] Date of Birth: 01/23/1976  Subjective/Objective:                    Action/Plan: Pt continues with low platelet count. CM following for d/c needs.  Expected Discharge Date:                  Expected Discharge Plan:     In-House Referral:     Discharge planning Services     Post Acute Care Choice:    Choice offered to:     DME Arranged:    DME Agency:     HH Arranged:    HH Agency:     Status of Service:  In process, will continue to follow  Medicare Important Message Given:    Date Medicare IM Given:    Medicare IM give by:    Date Additional Medicare IM Given:    Additional Medicare Important Message give by:     If discussed at Long Length of Stay Meetings, dates discussed:    Additional Comments:  Kermit BaloKelli F Johnathin Vanderschaaf, RN 09/02/2015, 11:48 AM

## 2015-09-02 NOTE — Progress Notes (Signed)
Patient ID: [name], female   DOB: 04/01/1975, 40 y.o.   MRN: [id number] Hematology: Day 5   Still no increase in platelets. Hb stable I will begin AMICAR to decrease incidence of bleeding from mucosal surfaces. We will need to give prophylactic vaccination with Pneumovax, meningitis vaccine, & Hemophilus influenzae vaccines in case we need to move quickly to splenectomy but these are IM injections so there is risk for hematoma formation so I would hold off for now.

## 2015-09-02 NOTE — Progress Notes (Signed)
Patient ID: [name], female   DOB: 08/14/1975, 40 y.o.   MRN: [id number] Hematology Addendum: Fundoscopic exam: optic discs sharp; vessels normal; no hemorrhage or exudate. Remainder of neuro exam remains stable and non-focal. OK to use benadryl prn insomnia but no other sedatives. Discussed with patient and daughter. If we proceed with Rituxan tomorrow - please move to a step down unit with monitor. Potential for severe allergic reactions discussed with patient and daughter. I ordered Hepatitis B serology: baseline; Rituxan may activate hepatitis B.

## 2015-09-03 DIAGNOSIS — I609 Nontraumatic subarachnoid hemorrhage, unspecified: Secondary | ICD-10-CM

## 2015-09-03 LAB — CBC WITH DIFFERENTIAL/PLATELET
BASOS PCT: 0 %
Basophils Absolute: 0 10*3/uL (ref 0.0–0.1)
EOS PCT: 1 %
Eosinophils Absolute: 0.1 10*3/uL (ref 0.0–0.7)
HEMATOCRIT: 39.3 % (ref 36.0–46.0)
Hemoglobin: 12.9 g/dL (ref 12.0–15.0)
LYMPHS PCT: 41 %
Lymphs Abs: 3.6 10*3/uL (ref 0.7–4.0)
MCH: 27.6 pg (ref 26.0–34.0)
MCHC: 32.8 g/dL (ref 30.0–36.0)
MCV: 84 fL (ref 78.0–100.0)
Monocytes Absolute: 0.5 10*3/uL (ref 0.1–1.0)
Monocytes Relative: 6 %
Neutro Abs: 4.6 10*3/uL (ref 1.7–7.7)
Neutrophils Relative %: 52 %
PLATELETS: 7 10*3/uL — AB (ref 150–400)
RBC: 4.68 MIL/uL (ref 3.87–5.11)
RDW: 13.7 % (ref 11.5–15.5)
WBC: 8.8 10*3/uL (ref 4.0–10.5)

## 2015-09-03 LAB — GLUCOSE, CAPILLARY
GLUCOSE-CAPILLARY: 179 mg/dL — AB (ref 65–99)
GLUCOSE-CAPILLARY: 132 mg/dL — AB (ref 65–99)
Glucose-Capillary: 108 mg/dL — ABNORMAL HIGH (ref 65–99)
Glucose-Capillary: 102 mg/dL — ABNORMAL HIGH (ref 65–99)

## 2015-09-03 LAB — HEPATITIS B SURFACE ANTIGEN: Hepatitis B Surface Ag: NEGATIVE

## 2015-09-03 LAB — PATHOLOGIST SMEAR REVIEW

## 2015-09-03 LAB — HEPATITIS B SURFACE ANTIBODY,QUALITATIVE: Hep B S Ab: REACTIVE

## 2015-09-03 MED ORDER — SODIUM CHLORIDE 0.9 % IV SOLN
INTRAVENOUS | Status: DC
Start: 2015-09-03 — End: 2015-09-04
  Administered 2015-09-03 (×2): 150 mL/h via INTRAVENOUS
  Administered 2015-09-04: 06:00:00 via INTRAVENOUS

## 2015-09-03 NOTE — Progress Notes (Signed)
Patient ID: [name], female   DOB: 12/30/1975, 40 y.o.   MRN: [id number] Hematology: Stable overnight. No headache. No focal neuro deficits on exam. BP running low since admission. Pulse  65-70. No signs of active bleeding. Hb stable at 12.9. Platelets read out by machine as 7,000: NB: this is an artifact! On review of over 30 50X microscope fields, I saw one (uno) platelet! Risk vs benefits of proposed Rx with Romiplostim (N-Plate) & Rituximab (Rituxan) discussed at length again with patient/translated by daughter. Potential for severe allergic reactions including anaphylaxis discussed. Potential for reactivation of viruses discussed. Alternative Rx with splenectomy discussed as well as risk vs benefit of surgery in someone who is morbidly obese and has no platelets. Patient extremely anxious which is understandable. Risk of hemorrhage in brain every day we don't see improvement in platelets, in my opinion, outweighs risk of potentioal side effects of therapy. Hepatitis B serology negative Impression: ITP refractory to steroids  Plan: to proceed with N-Plate 1 microgram/kg SQ weekly & Rituxan 375 mg/m2 weekly x 4 with close monitoring. I will begin NS @ 150 ml/hour to afford margin of safety due to anticipated fall in blood pressure which invariably occurs with 1st dose of Rituxan. I have reviewed spectrum of potential reactions with her RN.    Cephas DarbyJames Dontray Haberland, MD, FACP  Hematology-Oncology/Internal Medicine [phone]

## 2015-09-03 NOTE — Progress Notes (Signed)
Subjective Pt feeling anxious regarding rituxan and n-plate transfusion. Discussed reason why we are holding off on sedating medications.   Objective: Vital signs in last 24 hours: Filed Vitals:   09/03/15 0358 09/03/15 0400 09/03/15 0454 09/03/15 0755  BP:   Pulse:    65  Temp: 98.1 F (36.7 C)   98 F (36.7 C)  TempSrc: Oral   Oral  Resp:  12  17  Height:      Weight:      SpO2:  100%  100%   Weight change:   Intake/Output Summary (Last 24 hours) at 09/03/15 0946 Last data filed at 09/02/15 2300  Gross per 24 hour  Intake    240 ml  Output      0 ml  Net    240 ml   General: NAD, laying in bed HEENT: neg for bleeding, poor dentition. Moist mucous membranes.  Lungs: CTAB, no wheezing or crackles Cardiac: RRR, no murmurs, rubs, or gallops GI: soft, active bowel sounds, non TTP Neuro: CN II-XII grossly intact Skin: warm and dry  Lab Results: Basic Metabolic Panel:  Recent Labs Lab 08/28/15 1245  NA 139  K 3.6  CL 107  CO2 27  GLUCOSE 100*  BUN 12  CREATININE 0.62  CALCIUM 9.2   Liver Function Tests:  Recent Labs Lab 08/28/15 1245  AST 20  ALT 21  ALKPHOS 47  BILITOT 0.7  PROT 7.0  ALBUMIN 3.9   CBC:  Recent Labs Lab 09/02/15 0520 09/03/15 0526  WBC 8.6 8.8  NEUTROABS 4.9 4.6  HGB 12.9 12.9  HCT 39.1 39.3  MCV 84.1 84.0  PLT <5* 7*   D-Dimer:  Recent Labs Lab 08/28/15 1245  DDIMER <0.27   Coagulation:  Recent Labs Lab 08/28/15 1245  LABPROT 15.5*  INR 1.21   Anemia Panel:  Recent Labs Lab 08/28/15 1758  RETICCTPCT 1.2    Micro Results: Recent Results (from the past 240 hour(s))  MRSA PCR Screening     Status: None   Collection Time: 08/28/15  3:37 PM  Result Value Ref Range Status   MRSA by PCR NEGATIVE NEGATIVE Final    Comment:        The GeneXpert MRSA Assay (FDA approved for NASAL specimens only), is one component of a comprehensive MRSA colonization surveillance program. It is  not intended to diagnose MRSA infection nor to guide or monitor treatment for MRSA infections. Performed at [facility] [place]    Studies/Results: No results found. Medications: I have reviewed the patient's current medications. Scheduled Meds: . sodium chloride   Intravenous Once  . acetaminophen  650 mg Oral Once  . aminocaproic acid  2 g Oral Q6H  . diphenhydrAMINE  25 mg Intravenous Once  . insulin aspart  0-9 Units Subcutaneous TID AC  . riTUXimab (RITUXAN) IV infusion  375 mg/m2 Intravenous Once  . romiPLOStim  100 mcg Subcutaneous Once   Continuous Infusions: . 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 Reece Agar only one platelet noted.  - hemo/onc following, getting n-plate and rituxan today at noon. Starting on NS at 150 cc/hr for low nl BP overnight. Will monitor BP carefully as hypotension is a SE of rituxan. If develops hypotension can give 1L bolus of NS.  - hepatitis B surface  antibody positive and hepatitis b antigen negative which indicates no acute infection with questionable Vaccination to hep B vs previous infection with hep B, will check Hep B core antibody  - out of bed with assistance, SCDs  - will need to monitor for hep B reactivation during treatment pending hep B core antibody result if positive.  - CBC in the morning   Dizziness, generalized weakness-- could be SE of medications she is receiving this admission.  - cont to monitor - cont neuro checks  - hold off on antivert incase it masks any neuro sx   Dispo: Disposition is deferred at this time, awaiting improvement of current medical problems.   The patient does have a current PCP (No Pcp Per Patient) and [facility] not [facility] after discharge.   LOS: 6 days   Denton Brickiana M Truong, MD 09/03/2015,  9:46 AM

## 2015-09-03 NOTE — Progress Notes (Signed)
[facility] only have one PO Amicar.  [facility] if they have any more PO doses.  If they do not, she will call the primary MD and ask if it can be given IV.

## 2015-09-03 NOTE — Progress Notes (Signed)
Subjective: No acute events overnight.  Feeling anxious about new treatment.  Provided more explanation about what to expect with treatment and reassurance.  Objective: Vital signs in last 24 hours: Filed Vitals:   09/03/15 0358 09/03/15 0400 09/03/15 0454 09/03/15 0755  BP:  79/45 84/66 93/67   Pulse:    65  Temp: 98.1 F (36.7 C)   98 F (36.7 C)  TempSrc: Oral   Oral  Resp:  12  17  Height:      Weight:      SpO2:  100%  100%   Weight change:   Intake/Output Summary (Last 24 hours) at 09/03/15 0854 Last data filed at 09/02/15 2300  Gross per 24 hour  Intake    240 ml  Output      0 ml  Net    240 ml    Physical Exam General: laying in bed, no acute distress Cardiovascular:  Regular rate and rhythm, no murmurs, no rubs, no gallops, good pulses bilaterally Pulmonary: clear to ausculation bilaterally Abdominal:  Obese abdomen, non-tender, normal active bowel sounds,  resolving petichiae  Extermities:  No peripheral edema  Lab Results:  Recent Labs  09/02/15 0520 09/03/15 0526  WBC 8.6 8.8  NEUTROABS 4.9 4.6  HGB 12.9 12.9  HCT 39.1 39.3  MCV 84.1 84.0  PLT <5* 7*   CBG:  Recent Labs  09/01/15 2227 09/02/15 0625 09/02/15 1128 09/02/15 1629 09/02/15 2152 09/03/15 0759  GLUCAP 144* 117* 101* 107* 113* 108*    Studies/Results: No results found. Medications:  Scheduled Meds: . sodium chloride   Intravenous Once  . acetaminophen  650 mg Oral Once  . aminocaproic acid  2 g Oral Q6H  . diphenhydrAMINE  25 mg Intravenous Once  . insulin aspart  0-9 Units Subcutaneous TID AC  . riTUXimab (RITUXAN) IV infusion  375 mg/m2 Intravenous Once  . romiPLOStim  100 mcg Subcutaneous Once   Continuous Infusions: . sodium chloride     PRN Meds:.acetaminophen, traMADol, traZODone  Problem List Active Problems:   Acute ITP (HCC)   Assessment/Plan: [name] is a 40 year old spanish speaking woman with no significant PMH that presented with active  acute bleeding from gums who was found to have severe thrombocytopenia most likely due to acute ITP.  Acute ITP: Plts 7 today (<5K yesterday). Hemoglobin is stable. Heme-Onc (Dr. Cyndie ChimeGranfortuna) following. Receiving AMICAR to decrease risk of bleeding from mucosal surfaces. Starting NPLATE (Romiplostim) and Rituximab this afternoon per recommendations.  Rituximab puts patient at risk for Hep B reactivation.   Pt found to be Hep B surface ab positive and Hep B surface antigen negative but total core antibodies will be ordered to further risk stratify for Hep B reactivation and need for prophylaxis. Rituximab infusion is associated with  drop in blood pressure which is concerning as patient has soft blood pressure at baseline.  Plan to give IV fluid boluses if this were to occur.   Patient will need prophylactic vaccinations in case splenectomy is needed but will hold for now as IM injections increase risk of hematoma formation.  If spontanous bleed occurs recommended to give rrecombinant factor 7a and 1 unit of platelets.  -- r/p CBC in AM -- Total Hep B core antibody to further assess need for prophylaxis -- NPLATE and Rituximab today -- continue AMICAR  Subarachnoid Hemorrhage: No focal neurological deficits exhibited by patient but plan to closely monitor as she is at high risk for spontaneous bleed.  -- neuro  checks 24h  -- Tramodol  PRN for headache -- consider r/p CT  Steroid Induced Hyperglycemia: Blood glucose averaging in the low 100s after treatment with high dose steroids. Last dose received on 08/31/15 -- Sliding scale (Novolog) -- Glucose checks q4h  This is a Psychologist, occupational Note.  The care of the patient was discussed with Dr.Truong and the assessment and plan formulated with their assistance.  Please see their attached note for official documentation of the daily encounter.   LOS: 6 days   Danelle Earthly, Med Student 09/03/2015, 8:54 AM

## 2015-09-03 NOTE — Progress Notes (Signed)
Patient ID: [name], female   DOB: 02/19/1976, 40 y.o.   MRN: [id number] Hematology: Rituxan completed without incident. Transient, minor, chills earlier. Lungs clear Heart regular Average time to response about 7 days. Due to extreme high bleeding risk, she needs to remain hospitalized until platelet count >/= 20,000.

## 2015-09-03 NOTE — Progress Notes (Signed)
Pt BP has been running soft throughout the day/night. Checked BP at 0400, 79/45. MD Earnest ConroyFlores notified, was asked to check BP manually. RN rechecked BP manually and double verified with another Charity fundraiserN. Pt BP 84/66. MD made aware and wants to continue to monitor.

## 2015-09-03 NOTE — Progress Notes (Signed)
Patient ID: [name], female   DOB: 06/12/1975, 40 y.o.   MRN: [id number] Hematology: N-Plate given.Rituxan started x45 minutes so far with good tolerance. Lungs clear. Heart regular. BP low but stable. Continue to increase rate of infusion as tolerated.

## 2015-09-04 DIAGNOSIS — D696 Thrombocytopenia, unspecified: Secondary | ICD-10-CM

## 2015-09-04 DIAGNOSIS — B181 Chronic viral hepatitis B without delta-agent: Secondary | ICD-10-CM

## 2015-09-04 DIAGNOSIS — I609 Nontraumatic subarachnoid hemorrhage, unspecified: Secondary | ICD-10-CM

## 2015-09-04 LAB — CBC WITH DIFFERENTIAL/PLATELET
Basophils Absolute: 0 10*3/uL (ref 0.0–0.1)
Basophils Relative: 0 %
EOS PCT: 3 %
Eosinophils Absolute: 0.1 10*3/uL (ref 0.0–0.7)
HCT: 37.6 % (ref 36.0–46.0)
HEMOGLOBIN: 12.3 g/dL (ref 12.0–15.0)
LYMPHS ABS: 1.3 10*3/uL (ref 0.7–4.0)
LYMPHS PCT: 24 %
MCH: 27.5 pg (ref 26.0–34.0)
MCHC: 32.7 g/dL (ref 30.0–36.0)
MCV: 84.1 fL (ref 78.0–100.0)
MONOS PCT: 6 %
Monocytes Absolute: 0.3 10*3/uL (ref 0.1–1.0)
Neutro Abs: 3.6 10*3/uL (ref 1.7–7.7)
Neutrophils Relative %: 67 %
RBC: 4.47 MIL/uL (ref 3.87–5.11)
RDW: 13.6 % (ref 11.5–15.5)
WBC: 5.4 10*3/uL (ref 4.0–10.5)

## 2015-09-04 LAB — GLUCOSE, CAPILLARY
GLUCOSE-CAPILLARY: 137 mg/dL — AB (ref 65–99)
GLUCOSE-CAPILLARY: 125 mg/dL — AB (ref 65–99)
GLUCOSE-CAPILLARY: 133 mg/dL — AB (ref 65–99)
Glucose-Capillary: 94 mg/dL (ref 65–99)

## 2015-09-04 LAB — HEPATITIS B CORE ANTIBODY, TOTAL: HEP B C TOTAL AB: POSITIVE — AB

## 2015-09-04 MED ORDER — PHENYLEPHRINE HCL 0.25 % NA SOLN
1.0000 | Freq: Four times a day (QID) | NASAL | Status: DC | PRN
  Administered 2015-09-04 – 2015-09-15 (×4): 1 via NASAL
  Filled 2015-09-04: qty 15

## 2015-09-04 MED ORDER — SILVER NITRATE-POT NITRATE 75-25 % EX MISC
2.0000 | CUTANEOUS | Status: AC
Start: 2015-09-04 — End: 2015-09-05
  Filled 2015-09-04: qty 2

## 2015-09-04 NOTE — Progress Notes (Signed)
Subjective Pt still feeling denies, denies weakness.    Objective: Vital signs in last 24 hours: Filed Vitals:   09/03/15 1926 09/04/15 0000 09/04/15 0344 09/04/15 0745  BP: 93/53 102/82 93/54 90/48   Pulse: 82 80 65 54  Temp: 98.2 F (36.8 C) 98 F (36.7 C) 98.4 F (36.9 C) 98.7 F (37.1 C)  TempSrc: Oral Oral Oral Oral  Resp: Height:      Weight:      SpO2: 100% 99% 99% 99%   Weight change:   Intake/Output Summary (Last 24 hours) at 09/04/15 0856 Last data filed at 09/04/15 0800  Gross per 24 hour  Intake    820 ml  Output      0 ml  Net    820 ml   General: NAD, laying in bed HEENT: moist mucous membranes, no sclera icterus  Lungs: CTAB, no wheezing or crackles Cardiac: RRR, no murmurs, rubs, or gallops GI: soft, active bowel sounds, non TTP Neuro: CN II-XII grossly intact Skin: warm and dry  Lab Results: Basic Metabolic Panel:  Recent Labs Lab 08/28/15 1245  NA 139  K 3.6  CL 107  CO2 27  GLUCOSE 100*  BUN 12  CREATININE 0.62  CALCIUM 9.2   Liver Function Tests:  Recent Labs Lab 08/28/15 1245  AST 20  ALT 21  ALKPHOS 47  BILITOT 0.7  PROT 7.0  ALBUMIN 3.9   CBC:  Recent Labs Lab 09/03/15 0526 09/04/15 0441  WBC 8.8 5.4  NEUTROABS 4.6 3.6  HGB 12.9 12.3  HCT 39.3 37.6  MCV 84.0 84.1  PLT 7* <5*   D-Dimer:  Recent Labs Lab 08/28/15 1245  DDIMER <0.27   Coagulation:  Recent Labs Lab 08/28/15 1245  LABPROT 15.5*  INR 1.21   Anemia Panel:  Recent Labs Lab 08/28/15 1758  RETICCTPCT 1.2    Micro Results: Recent Results (from the past 240 hour(s))  MRSA PCR Screening     Status: None   Collection Time: 08/28/15  3:37 PM  Result Value Ref Range Status   MRSA by PCR NEGATIVE NEGATIVE Final    Comment:        The GeneXpert MRSA Assay (FDA approved for NASAL specimens only), is one component of a comprehensive MRSA colonization surveillance program. It is not intended to diagnose MRSA infection  nor to guide or monitor treatment for MRSA infections. Performed at [facility]    Medications: I have reviewed the patient's current medications. Scheduled Meds: . sodium chloride   Intravenous Once  . aminocaproic acid  2 g Oral Q6H  . insulin aspart  0-9 Units Subcutaneous TID AC   Continuous Infusions: . sodium chloride 50 mL/hr at 09/04/15 0613   PRN Meds:.acetaminophen, traMADol, traZODone Assessment/Plan: Principal Problem:   Acute ITP (HCC) Active Problems:   Chronic hepatitis B (HCC)   Nontraumatic subarachnoid hemorrhage (HCC)   Severe thrombocytopenia (HCC)   Severe thrombocytopenia 2/2 ITP-- refractory to IVIG and decadron. plt <5 - hemo/onc following, received dose of n-plate and rituxan yesterday.  - hepatitis B surface and core antibody positive, surface antigen B negative. She is considered moderate risk for reactivation due high risk anti-CD20 therapy. UTD recommends hepatitis B prophylactic therapy. Will discuss with Dr. Loreta Ave with GI regarding possible entecavir therapy who does not recommend therapy. Per ID, recommend Hep B e antigen, Hep B viral load, and LFTs.  - checking hepatitis B viral load (send out test) - out  of bed with assistance, SCDs  - CBC daily, LFTs in the morning   Dizziness-- unchanged, will CTM  - cont neuro checks  - hold off on antivert incase it masks any neuro sx   Dispo: Disposition is deferred at this time, awaiting improvement of current medical problems.   The patient does have a current PCP (No Pcp Per Patient) and [facility] not [facility] after discharge.   LOS: 7 days   Denton Brickiana M Truong, MD 09/04/2015, 8:56 AM

## 2015-09-04 NOTE — Progress Notes (Signed)
Patient with nose bleed from the left nare 2/2 severe thrombocytopenia. RN unable to stop with simple gauze packing. Discussed with ENT [facility]([place]), suggested nasal balloon and Afrin spray. Afrin spray given by RN. Slowed bleeding quite a bit prior to seeing patient at bedside. By the time we arrived to the bedside, bleeding was minimal but patient noted continued oozing down her throat. No obvious drainage from the nose. Attempted to place nasal balloon personally, however, this was not successful due to resistance. Did not re-attempt in order to avoid further trauma to the nasal passage. Given another spray of Afrin and will place gauze. Will continue to monitor.    Lauris ChromanWoody Zelphia Glover, MD PGY-3, Internal Medicine Pager: [phone]

## 2015-09-04 NOTE — Progress Notes (Signed)
Patient developed nose bleed from left side. I packed it with gauze. MD notified plan is to continue to monitor it and I will will update him if it gets worse. Patient vitals stable but continues to c/o dizziness.

## 2015-09-05 LAB — HEPATIC FUNCTION PANEL
ALBUMIN: 2.8 g/dL — AB (ref 3.5–5.0)
ALT: 27 U/L (ref 14–54)
AST: 19 U/L (ref 15–41)
Alkaline Phosphatase: 49 U/L (ref 38–126)
BILIRUBIN INDIRECT: 0.1 mg/dL — AB (ref 0.3–0.9)
Bilirubin, Direct: 0.1 mg/dL (ref 0.1–0.5)
TOTAL PROTEIN: 8.4 g/dL — AB (ref 6.5–8.1)
Total Bilirubin: 0.2 mg/dL — ABNORMAL LOW (ref 0.3–1.2)

## 2015-09-05 LAB — CBC WITH DIFFERENTIAL/PLATELET
BASOS ABS: 0 10*3/uL (ref 0.0–0.1)
BASOS PCT: 0 %
EOS ABS: 0.2 10*3/uL (ref 0.0–0.7)
EOS PCT: 2 %
HCT: 37.1 % (ref 36.0–46.0)
HEMOGLOBIN: 12.3 g/dL (ref 12.0–15.0)
Lymphocytes Relative: 20 %
Lymphs Abs: 1.5 10*3/uL (ref 0.7–4.0)
MCH: 27.8 pg (ref 26.0–34.0)
MCHC: 33.2 g/dL (ref 30.0–36.0)
MCV: 83.9 fL (ref 78.0–100.0)
Monocytes Absolute: 0.5 10*3/uL (ref 0.1–1.0)
Monocytes Relative: 6 %
NEUTROS PCT: 71 %
Neutro Abs: 5.3 10*3/uL (ref 1.7–7.7)
RBC: 4.42 MIL/uL (ref 3.87–5.11)
RDW: 13.4 % (ref 11.5–15.5)
WBC: 7.5 10*3/uL (ref 4.0–10.5)

## 2015-09-05 LAB — GLUCOSE, CAPILLARY
GLUCOSE-CAPILLARY: 175 mg/dL — AB (ref 65–99)
GLUCOSE-CAPILLARY: 130 mg/dL — AB (ref 65–99)
GLUCOSE-CAPILLARY: 179 mg/dL — AB (ref 65–99)

## 2015-09-05 MED ORDER — NORETHINDRONE ACETATE 5 MG PO TABS
5.0000 mg | ORAL_TABLET | Freq: Every day | ORAL | Status: DC
  Administered 2015-09-05 – 2015-09-15 (×11): 5 mg via ORAL
  Filled 2015-09-05 (×12): qty 1

## 2015-09-05 NOTE — Progress Notes (Signed)
Subjective Pt had a nose bleed over night from both nares, nasal balloon placement was unsuccessful. This morning her hgb is stable and bleeding from right nare has stopped. She still has slow bleeding from left nare, nasal packing removed from nare this morning revealed a lg blood clot. Bleeding improved w/ afrin nasal spray. She also has a left upper lip blood blister that is not oozing any blood. She has dizziness and a HA that improved w/ tylenol.  Per RN report, pt had pink urine today.   Objective: Vital signs in last 24 hours: Filed Vitals:   09/04/15 2018 09/05/15 0016 09/05/15 0420 09/05/15 0728  BP: 97/58 96/54 100/62 102/47  Pulse: 56 74 69 65  Temp: 97.9 F (36.6 C) 98.3 F (36.8 C) 98.4 F (36.9 C)   TempSrc: Oral Oral Oral   Resp: Height:      Weight:      SpO2: 96% 98% 98% 100%   Weight change:   Intake/Output Summary (Last 24 hours) at 09/05/15 0838 Last data filed at 09/05/15 0728  Gross per 24 hour  Intake   1850 ml  Output      0 ml  Net   1850 ml   General: NAD, laying in bed w/ head of bed elevated.  HEENT: moist mucous membranes, no sclera icterus, left nare dry gauze just placed, no blood noted in the back of her throat or on tongue, left upper lip ulcer with dried blood around it.   Lungs: CTAB, no wheezing or crackles Cardiac: RRR, no murmurs, rubs, or gallops GI: soft, active bowel sounds, non TTP Neuro: CN II-XII intact, 5/5 upper an lower extremity weakness.  Skin: warm and dry, resolving petechial rash  Lab Results: Liver Function Tests:  Recent Labs Lab 09/05/15 0538  AST 19  ALT 27  ALKPHOS 49  BILITOT 0.2*  PROT 8.4*  ALBUMIN 2.8*   CBC:  Recent Labs Lab 09/04/15 0441 09/05/15 0538  WBC 5.4 7.5  NEUTROABS 3.6 5.3  HGB 12.3 12.3  HCT 37.6 37.1  MCV 84.1 83.9  PLT <5* <5*     Medications: I have reviewed the patient's current medications. Scheduled Meds: . sodium chloride   Intravenous Once  .  aminocaproic acid  2 g Oral Q6H  . insulin aspart  0-9 Units Subcutaneous TID AC  . silver nitrate applicators  2 application Topical STAT   Continuous Infusions:   PRN Meds:.acetaminophen, phenylephrine, traMADol, traZODone Assessment/Plan: Principal Problem:   Acute ITP (HCC) Active Problems:   Chronic hepatitis B (HCC)   Nontraumatic subarachnoid hemorrhage (HCC)   Severe thrombocytopenia (HCC)   Severe thrombocytopenia 2/2 ITP-- refractory to IVIG and decadron. plt <5 - hemo/onc following, received dose of n-plate and rituxan 5/16  - hepatitis B surface and core antibody positive, surface antigen B negative. She is considered moderate risk for reactivation due high risk anti-CD20 therapy. UTD recommends hepatitis B prophylactic therapy. Will discuss with Dr. Loreta Ave with GI regarding possible entecavir therapy who does not recommend therapy. Per ID, recommend Hep B e antigen, Hep B viral load.  - aygestin 5 mg daily to suppress menstrual cycle - checking hepatitis B viral load (send out test) - out of bed with assistance, SCDs  - CBC daily  Dizziness--- neuro exam unchanged this morning - cont neuro checks  - hold off on antivert incase it masks any neuro sx   Dispo: Disposition is deferred at this time, awaiting improvement  of current medical problems.   The patient does have a current PCP (No Pcp Per Patient) and does not need an [facility] follow-up appointment after discharge.   LOS: 8 days   [name] Alice Lacomb, MD 09/05/2015, 8:38 AM

## 2015-09-05 NOTE — Progress Notes (Signed)
Blood from left nostril is decreasing , still continuing to pack with gauze moistened with normal saline. Marisue Ivanobyn Jamel Dunton RN

## 2015-09-05 NOTE — Progress Notes (Signed)
Patient ID: [name], female   DOB: 01/16/1976, 40 y.o.   MRN: [id number] Hematology: She began to have spontaneous epistaxis from the left nare yesterday evening. Local measures enacted. Currently with saline soaked gauze wicks. Bleeding subsiding. She has also started to develop low-grade vaginal bleeding. Hemoglobin stable at 12.3 g. Platelets remain essentially undetectable. Neurologically stable. Lungs clear. Regular cardiac rhythm. Impression: Refractory ITP Date 2 post N-plate thrombopoietin agonist plus Rituxan She is receiving oral Amicar to decrease mucosal oozing. I would try to minimize manipulation in the nasal cavity to avoid trauma that might make bleeding worse. Continue with the saline soaked nasal wicks. I will add estrogen, Aygestin, 5 mg daily to suppress menses. In the event of a major bleeding event or change in neurologic status: Give immediate platelet transfusion, followed by recombinant VIIa 60 mcg/kg IV push. If bleeding cannot be controlled, call general surgery, then emergency splenectomy Discussed with housestaff.

## 2015-09-06 DIAGNOSIS — K5900 Constipation, unspecified: Secondary | ICD-10-CM

## 2015-09-06 LAB — GLUCOSE, CAPILLARY
GLUCOSE-CAPILLARY: 110 mg/dL — AB (ref 65–99)
Glucose-Capillary: 105 mg/dL — ABNORMAL HIGH (ref 65–99)
Glucose-Capillary: 128 mg/dL — ABNORMAL HIGH (ref 65–99)

## 2015-09-06 LAB — CBC WITH DIFFERENTIAL/PLATELET
BASOS ABS: 0 10*3/uL (ref 0.0–0.1)
Basophils Relative: 0 %
EOS ABS: 0.1 10*3/uL (ref 0.0–0.7)
EOS PCT: 2 %
HCT: 35.3 % — ABNORMAL LOW (ref 36.0–46.0)
Hemoglobin: 11.4 g/dL — ABNORMAL LOW (ref 12.0–15.0)
LYMPHS PCT: 23 %
Lymphs Abs: 1.4 10*3/uL (ref 0.7–4.0)
MCH: 26.9 pg (ref 26.0–34.0)
MCHC: 32.3 g/dL (ref 30.0–36.0)
MCV: 83.3 fL (ref 78.0–100.0)
MONO ABS: 0.6 10*3/uL (ref 0.1–1.0)
Monocytes Relative: 9 %
Neutro Abs: 4.2 10*3/uL (ref 1.7–7.7)
Neutrophils Relative %: 66 %
RBC: 4.24 MIL/uL (ref 3.87–5.11)
RDW: 13.5 % (ref 11.5–15.5)
WBC: 6.4 10*3/uL (ref 4.0–10.5)

## 2015-09-06 LAB — HIV-1 RNA QUANT-NO REFLEX-BLD: LOG10 HIV-1 RNA: UNDETERMINED log10copy/mL

## 2015-09-06 MED ORDER — SENNA 8.6 MG PO TABS
2.0000 | ORAL_TABLET | Freq: Every day | ORAL | Status: DC
  Administered 2015-09-06 – 2015-09-15 (×9): 17.2 mg via ORAL
  Filled 2015-09-06 (×9): qty 2

## 2015-09-06 MED ORDER — POLYETHYLENE GLYCOL 3350 17 G PO PACK
17.0000 g | PACK | Freq: Every day | ORAL | Status: DC
  Administered 2015-09-06 – 2015-09-15 (×9): 17 g via ORAL
  Filled 2015-09-06 (×9): qty 1

## 2015-09-06 MED ORDER — INSULIN ASPART 100 UNIT/ML ~~LOC~~ SOLN: 0.0000 [IU] | Freq: Every day | SUBCUTANEOUS | Status: DC

## 2015-09-06 NOTE — Progress Notes (Signed)
   Subjective No new events. Her nosebleed has improved after packing and afrin nasal spray. Not having significant headaches requiring medication. She has moderate constipation with no bowel movements for 3 days.  Objective: Vital signs in last 24 hours: Filed Vitals:   09/05/15 2241 09/06/15 0232 09/06/15 0718 09/06/15 1220  BP: 108/66 113/67 93/61 103/56  Pulse: 68 77 64 69  Temp: 99.2 F (37.3 C) 98.2 F (36.8 C) 98.4 F (36.9 C) 98.5 F (36.9 C)  TempSrc: Oral Oral Oral Oral  Resp: 17 17 21 22   Height:      Weight:      SpO2: 93% 98% 99% 97%   Weight change:   Intake/Output Summary (Last 24 hours) at 09/06/15 1436 Last data filed at 09/06/15 0800  Gross per 24 hour  Intake    960 ml  Output      0 ml  Net    960 ml   General: NAD, laying in bed w/ head of bed elevated.  HEENT: Moist mucous membranes, no bleeding lesions Lungs: CTAB, no wheezing or crackles Cardiac: RRR, no murmurs, rubs, or gallops GI: Soft, no guarding or rebound, non TTP Neuro: CN II-XII intact, 5/5 strength but subjectively weak/unsteady gait Skin: warm and dry  Lab Results: Liver Function Tests:  Recent Labs Lab 09/05/15 0538  AST 19  ALT 27  ALKPHOS 49  BILITOT 0.2*  PROT 8.4*  ALBUMIN 2.8*   CBC:  Recent Labs Lab 09/05/15 0538 09/06/15 0744  WBC 7.5 6.4  NEUTROABS 5.3 4.2  HGB 12.3 11.4*  HCT 37.1 35.3*  MCV 83.9 83.3  PLT <5* <5*     Medications: I have reviewed the patient's current medications. Scheduled Meds: . sodium chloride   Intravenous Once  . aminocaproic acid  2 g Oral Q6H  . norethindrone  5 mg Oral Daily  . polyethylene glycol  17 g Oral Daily  . senna  2 tablet Oral Daily   Continuous Infusions:   PRN Meds:.acetaminophen, phenylephrine, traMADol, traZODone Assessment/Plan: Severe thrombocytopenia 2/2 ITP- Platelet count remaining <5. She did not respond to decadron or IVIG now is day 3 post rituximab.  awaiting clinical response that is likely in  1-2 weeks. Hepatitis B surface and core antibody positive, surface antigen B negative, making her a moderate risk category for disease activation. Based on this we will check and monitor viral DNA load and treat if present but not start prophylaxis at this time. - hemo/onc following, received dose of n-plate and rituxan 5/16, recommendations appreciated - aygestin 5 mg daily to suppress menstrual cycle due to bleeding risk - HepB viral load pending - CBC daily - No pharmacologic VTE ppx  Dizziness- No focal neurological deficits. Due to her increased fall risk and severe thrombocytopenia she will need to remain in bed or limited up with assistance. - cont neuro checks   Constipation: No bowel movement x3 days. Not on many opioid or other agents to decrease motility. Has not had any bowel regimen whatsoever. -Start senna, miralax daily PRN while constipated  FULL CODE Diet: Carb mod VTE ppx: SCDs  Dispo: Disposition is deferred at this time, awaiting improvement of current medical problems.  The patient does have a current PCP (No Pcp Per Patient) and does not need [facility] follow-[facility] after discharge.  LOS: 9 days   Fuller Planhristopher W Shante Archambeault, MD 09/06/2015, 2:36 PM

## 2015-09-06 NOTE — Progress Notes (Signed)
Subjective: No acute events overnight.  No complaints.  No active bleeding this morning.  Has not had a BM in 3 days.  Objective: Vital signs in last 24 hours: Filed Vitals:   09/05/15 1955 09/05/15 2241 09/06/15 0232 09/06/15 0718  BP: 103/59 108/66 113/67 93/61  Pulse: 76 68 77 64  Temp: 97.9 F (36.6 C) 99.2 F (37.3 C) 98.2 F (36.8 C) 98.4 F (36.9 C)  TempSrc: Oral Oral Oral Oral  Resp: 16 17 17 21   Height:      Weight:      SpO2: 97% 93% 98% 99%    Intake/Output Summary (Last 24 hours) at 09/06/15 1017 Last data filed at 09/05/15 2100  Gross per 24 hour  Intake   1200 ml  Output      0 ml  Net   1200 ml    Physical Exam General: laying in bed, no acute distress Cardiovascular:  Regular rate and rhythm, no murmurs, no rubs, no gallops, good pulses bilaterally Abdominal:  Obese abdomen, non-tender, normal active bowel sounds Extermities:  No peripheral edema  Lab Results:  Recent Labs  09/05/15 0538 09/06/15 0744  WBC 7.5 6.4  NEUTROABS 5.3 4.2  HGB 12.3 11.4*  HCT 37.1 35.3*  MCV 83.9 83.3  PLT <5* <5*   CBG:  Recent Labs  09/04/15 1629 09/04/15 2113 09/05/15 0837 09/05/15 1315 09/05/15 1713 09/06/15 0807  GLUCAP 125* 133* 175* 130* 179* 105*    Studies/Results: No results found. Medications:  Scheduled Meds: . sodium chloride   Intravenous Once  . aminocaproic acid  2 g Oral Q6H  . insulin aspart  0-9 Units Subcutaneous TID AC  . norethindrone  5 mg Oral Daily   Continuous Infusions:   PRN Meds:.acetaminophen, phenylephrine, traMADol, traZODone  Problem List Principal Problem:   Acute ITP (HCC) Active Problems:   Chronic hepatitis B (HCC)   Nontraumatic subarachnoid hemorrhage (HCC)   Severe thrombocytopenia (HCC)   Assessment/Plan: [name] is a 40 year old spanish speaking woman with no significant PMH that presented with active acute bleeding from gums who was found to have severe thrombocytopenia most likely  due to acute ITP.  Refractory Acute ITP: Plts <5 today (<5K yesterday). Hemoglobin is stable.  Patient had an episode of mild bleeding from nasal mucosa and vaginal bleeding this weekend that has since resolved.  Heme-Onc (Dr. Cyndie ChimeGranfortuna) following. Receiving AMICAR to decrease risk of bleeding from mucosal surfaces. Received NPLATE (Romiplostim) and Rituximab on 09/03/15.  Rituximab puts patient at risk for Hep B reactivation.   Pt found to be Hep B surface ab and Hep B core antibody positive and Hep B surface antigen negative suggesting she has recovered from a past infection. .  Based on Hep B serology, pt at risk of Hep B reactivation but prophylaxis is not recommended at this time per GI consult. Patient will be continue to be closely monitored.  LFTs and Hep B viral load needed to obtain baseline asdefined as an increase in ALT > 3x ULN or increase in HBV viral load from baseline are markers for HBV reactivation.   LFTs (ALT:  19) were normal, Hep B viral load pending. Hep B e antigen and Hep e antibody also ordered per ID recommendation.  -- r/p CBC in AM -- Out of bed with assistance -- continue AMICAR -- continue Aygestin 5mg  daily to suppress menstrual cycle -- Hep B e antigen and antibody pending -- HBV viral load pending  Subarachnoid Hemorrhage: No  focal neurological deficits exhibited by patient but plan to closely monitor as she is at high risk for spontaneous bleed.  -- continue neuro checks q4h   Steroid Induced Hyperglycemia: Blood glucose averaging in the low 100s. High dose steroids last received on 08/31/15.  Glucose checks should be be done less frequent as blood sugars have been stable and patient is at high risk of bleeding.  -- glucose check q24 hours -- d/c sliding scale  Constipation: -- Begin Senna and Miralax   This is a Psychologist, occupational Note.  The care of the patient was discussed with Dr. Dimple Casey  and the assessment and plan formulated with their assistance.   Please see their attached note for official documentation of the daily encounter.   LOS: 9 days   Danelle Earthly, Med Student 09/06/2015, 10:17 AM

## 2015-09-06 NOTE — Progress Notes (Signed)
Patient ID: [name], female   DOB: 12/26/1975, 40 y.o.   MRN: [id number] Hematology: No bleeding overnight. Exam stable alert, oriented, PERRL, no focal neuro deficit Lab orders expired: I have renewed Impression: Refractory ITP d3 post TPO/Rituxan - waiting for response Continue AMICAR/Aygestin

## 2015-09-06 NOTE — Progress Notes (Signed)
Physical Therapy Treatment Patient Details Name: [name] MRN: [id number] DOB: 09/18/1975 Today's Date: 09/06/2015    History of Present Illness [name] is a 40yo woman with no significant PMH who presents with gingival bleeding which she could not control. Apparently in the ED waiting room, she also developed a petechial rash on her LE, pannus and neck. Her platelet count was noted to be zero in the ED. CT head showed a subarrachnoid hemorrhage, and she complained of headache.Pt to have been diagnosed with thrombocytopenia    PT Comments    Patient progressing with mobility despite platelets remaining low.  Feel she may have either bilateral vestibular hypofunction or dizziness related to [facility].  Will benefit from continuing skilled PT in the acute setting due to currently no insurance, dizziness, and limited mobility with severe thrombocytopenia.    Follow Up Recommendations  No PT follow up;Supervision/Assistance - 24 hour     Equipment Recommendations  None recommended by PT    Recommendations for Other Services       Precautions / Restrictions Precautions Precautions: Fall Precaution Comments: dizzines/room spinning even in supine    Mobility  Bed Mobility Overal bed mobility: Modified Independent                Transfers Overall transfer level: Needs assistance   Transfers: Sit to/from Stand Sit to Stand: Min guard         General transfer comment: for safety/balance  Ambulation/Gait             General Gait Details: NT due to spinning and low platelets   Stairs            Wheelchair Mobility    Modified Rankin (Stroke Patients Only)       Balance Overall balance assessment: Needs assistance                                  Cognition Arousal/Alertness: Awake/alert Behavior During Therapy: WFL for tasks assessed/performed Overall Cognitive Status: Within Functional Limits for tasks  assessed                      Exercises      General Comments General comments (skin integrity, edema, etc.): Noted pt with negative supine head roll, positive refixation with head thrust test bilaterally, intact VOR cancellation, impaired balance and intact hearing.  Concern for possible bilateral hypofunction, but could also be central dizziness due to [facility] which may both improve when she is allowed to mobilize; encouraged focal point for improved symptoms when able to ambulate.      Pertinent Vitals/Pain Pain Assessment: No/denies pain    Home Living                      Prior Function            PT Goals (current goals can now be found in the care plan section) Progress towards PT goals: Progressing toward goals    Frequency  Min 3X/week    PT Plan Current plan remains appropriate    Co-evaluation             End of Session Equipment Utilized During Treatment: Gait belt Activity Tolerance: Patient tolerated treatment well Patient left: in bed;with call bell/phone within reach;with family/visitor present     Time: 1302-1330 PT Time Calculation (min) (ACUTE ONLY): 28 min  Charges:  $Neuromuscular Re-education:  8-22 mins $Self Care/Home Management: 11/12/22                    G Codes:      Elray Mcgregor 09-Sep-2015, 2:16 PM  [place], [place] [phone] 09-Sep-2015

## 2015-09-07 LAB — CBC WITH DIFFERENTIAL/PLATELET
Basophils Absolute: 0 10*3/uL (ref 0.0–0.1)
Basophils Relative: 0 %
EOS PCT: 2 %
Eosinophils Absolute: 0.1 10*3/uL (ref 0.0–0.7)
HCT: 33.5 % — ABNORMAL LOW (ref 36.0–46.0)
Hemoglobin: 10.8 g/dL — ABNORMAL LOW (ref 12.0–15.0)
LYMPHS ABS: 1.7 10*3/uL (ref 0.7–4.0)
Lymphocytes Relative: 26 %
MCH: 27.1 pg (ref 26.0–34.0)
MCHC: 32.2 g/dL (ref 30.0–36.0)
MCV: 84 fL (ref 78.0–100.0)
MONO ABS: 0.6 10*3/uL (ref 0.1–1.0)
MONOS PCT: 9 %
Neutro Abs: 4 10*3/uL (ref 1.7–7.7)
Neutrophils Relative %: 63 %
RBC: 3.99 MIL/uL (ref 3.87–5.11)
RDW: 13.4 % (ref 11.5–15.5)
WBC: 6.4 10*3/uL (ref 4.0–10.5)

## 2015-09-07 LAB — GLUCOSE, CAPILLARY
GLUCOSE-CAPILLARY: 100 mg/dL — AB (ref 65–99)
GLUCOSE-CAPILLARY: 147 mg/dL — AB (ref 65–99)
Glucose-Capillary: 102 mg/dL — ABNORMAL HIGH (ref 65–99)
Glucose-Capillary: 178 mg/dL — ABNORMAL HIGH (ref 65–99)

## 2015-09-07 NOTE — Patient Instructions (Signed)
Gaze Stabilization: Sitting    Keeping eyes on target on wall 5 feet away, tilt head down 15-30 and move head side to side for __30-60__ seconds. Repeat while moving head up and down for 30-60____ seconds. Do __4-5__ sessions per day. .  Copyright  VHI. All rights reserved.  Gaze Stabilization: Tip Card  1.Target must remain in focus, not blurry, and appear stationary while head is in motion. 2.Perform exercises with small head movements (45 to either side of midline). 3.Increase speed of head motion so long as target is in focus. 4.If you wear eyeglasses, be sure you can see target through lens (therapist will give specific instructions for bifocal / progressive lenses). 5.These exercises may provoke dizziness or nausea. Work through these symptoms. If too dizzy, slow head movement slightly. Rest between each exercise. 6.Exercises demand concentration; avoid distractions. 7.For safety, perform standing exercises close to a counter, wall, corner, or next to someone.  Copyright  VHI. All rights reserved.

## 2015-09-07 NOTE — Progress Notes (Signed)
Physical Therapy Treatment Patient Details Name: [name] MRN: [id number] DOB: 01/11/1976 Today's Date: 09/07/2015    History of Present Illness [name] is a 40yo woman with no significant PMH who presents with gingival bleeding which she could not control. Apparently in the ED waiting room, she also developed a petechial rash on her LE, pannus and neck. Her platelet count was noted to be zero in the ED. CT head showed a subarrachnoid hemorrhage, and she complained of headache.Pt to have been diagnosed with thrombocytopenia    PT Comments    Patient progressing with mobility as well as with vestibular rehab.  Feel will continue to benefit from follow up with skilled PT for progress with vestibular rehab.   Follow Up Recommendations  No PT follow up;Supervision/Assistance - 24 hour     Equipment Recommendations  None recommended by PT    Recommendations for Other Services       Precautions / Restrictions Precautions Precautions: Fall Precaution Comments: dizzines/room spinning even in supine; severely low platelets    Mobility  Bed Mobility Overal bed mobility: Modified Independent                Transfers Overall transfer level: Needs assistance Equipment used: Rolling walker (2 wheeled) Transfers: Sit to/from Stand Sit to Stand: Supervision         General transfer comment: safety  Ambulation/Gait Ambulation/Gait assistance: Min guard;Min assist Ambulation Distance (Feet): 15 Feet (x 2) Assistive device: Rolling walker (2 wheeled);1 person hand held assist Gait Pattern/deviations: Step-through pattern;Decreased stride length     General Gait Details: limited to in room, did not feel she needed walker, so second walk to door and back to bed wtih HHA   Stairs            Wheelchair Mobility    Modified Rankin (Stroke Patients Only)       Balance Overall balance assessment: Needs assistance           Standing  balance-Leahy Scale: Fair Standing balance comment: assist for safety due to low platelets and recently bleeding                    Cognition Arousal/Alertness: Awake/alert Behavior During Therapy: WFL for tasks assessed/performed Overall Cognitive Status: Within Functional Limits for tasks assessed                      Exercises Other Exercises Other Exercises: seated EOB for x1 gazing with thumb at arms length 30 seconds horizontal cues for target maintenance, 30 sec vertical    General Comments General comments (skin integrity, edema, etc.): relates slower spinning today as compared to yesterday;  After ambulation relates 8/10 dizziness, but appears not in distress; performed seated VOR, then issued for HEP with education with daughter interpreting      Pertinent Vitals/Pain Pain Assessment: Faces Faces Pain Scale: Hurts little more Pain Location: back Pain Descriptors / Indicators: Aching;Tightness Pain Intervention(s): Monitored during session;Repositioned    Home Living                      Prior Function            PT Goals (current goals can now be found in the care plan section) Progress towards PT goals: Progressing toward goals    Frequency  Min 3X/week    PT Plan Current plan remains appropriate    Co-evaluation  End of Session Equipment Utilized During Treatment: Gait belt Activity Tolerance: Patient tolerated treatment well Patient left: in bed;with call bell/phone within reach;with family/visitor present     Time: 1430-1453 PT Time Calculation (min) (ACUTE ONLY): 23 min  Charges:  $Gait Training: 8-22 mins $Neuromuscular Re-education: 8-22 mins                    G Codes:      Elray Mcgregor 09/29/2015, 4:39 PM  Sheran Lawless, PT [phone] September 29, 2015

## 2015-09-07 NOTE — Progress Notes (Signed)
Interpreter called this am for pt to sign waiver of right to free interpreter services, pt has been using daughter to communicated between self and nursing staff. Pt wants to continue to use daughter for communication wavier signed all questions answered, nursing will cont to monitor 

## 2015-09-07 NOTE — Progress Notes (Signed)
Subjective: No acute events overnight.  Complains of mild 3/10 headache and intermittent dizziness.  No active bleeding or abdominal pain.  Had a bowel movement last night.  Objective: Vital signs in last 24 hours: Filed Vitals:   09/07/15 0309 09/07/15 0800 09/07/15 0820 09/07/15 0821  BP: 118/69 92/47 101/58 101/58  Pulse: 82 67 67 63  Temp:    98.1 F (36.7 C)  TempSrc:    Oral  Resp: Height:      Weight:      SpO2: 98% 98% 99% 99%    Intake/Output Summary (Last 24 hours) at 09/07/15 0838 Last data filed at 09/07/15 0516  Gross per 24 hour  Intake      0 ml  Output    300 ml  Net   -300 ml    Physical Exam HEENT: Extra occular movements intact; Pupils equal, round and reactive to light  General: laying in bed, no acute distress Cardiovascular:  Regular rate and rhythm, no murmurs, no rubs, no gallops, good pulses bilaterally Pulmonary: clear to auscultation bilaterally Abdominal:  Obese abdomen, non-tender, normal active bowel sounds Extermities:  No peripheral edema Skin:  No petechiae seen on trunk and abdomen Neuro:  Alert and oriented, CN II-XII grossly intact  Lab Results:  Recent Labs  09/06/15 0744 09/07/15 0455  WBC 6.4 6.4  NEUTROABS 4.2 4.0  HGB 11.4* 10.8*  HCT 35.3* 33.5*  MCV 83.3 84.0  PLT <5* <5*   CBG:  Recent Labs  09/05/15 0837 09/05/15 1315 09/05/15 1713 09/06/15 0807 09/06/15 1228 09/06/15 1658  GLUCAP 175* 130* 179* 105* 128* 110*   Medications:  Scheduled Meds: . sodium chloride   Intravenous Once  . aminocaproic acid  2 g Oral Q6H  . norethindrone  5 mg Oral Daily  . polyethylene glycol  17 g Oral Daily  . senna  2 tablet Oral Daily   Continuous Infusions:   PRN Meds:.acetaminophen, phenylephrine, traMADol, traZODone  Problem List Principal Problem:   Acute ITP (HCC) Active Problems:   Chronic hepatitis B (HCC)   Nontraumatic subarachnoid hemorrhage (HCC)   Severe thrombocytopenia  (HCC)   Assessment/Plan: Ms. Antonieta Loveless is a 40 year old spanish speaking woman with no significant PMH that presented with active acute bleeding from gums who was found to have severe thrombocytopeniamost likely due to acute ITP.  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 Platelet count will continue to be monitored for treatment response.   Hemoglobin is down to 10.8 (11.4 yesterday) possibly due to episode of nasal bleeding this weekend and will be closely monitored for stabilization. Seen by PT yesterday who recommend 24 hour supervision/ assistance.  Heme-Onc (Dr. Cyndie Chime) following. Receiving AMICAR to decrease risk of bleeding from mucosal surfaces.   Rituximab puts patient at risk for Hep B reactivation.   Pt found to be Hep B surface ab and Hep B core antibody positive and Hep B surface antigen negative suggesting she has recovered from a past infection.  Based on Hep B serology, pt at risk of Hep B reactivation but prophylaxis is not recommended at this time per GI consult.  LFTs and Hep B viral load needed to obtain baseline as an increase in ALT > 3x ULN or increase in HBV viral load from baseline are markers for HBV reactivation.  Last LFTs (ALT:  19) were normal, Hep B viral load pending. Hep B e antigen and Hep e  antibody also ordered per ID recommendation.  -- Continue daily CBC w/ diff to monitor platelet count and hemoglobin -- Agree with PT recommendations, Out of bed with assistance -- Continue AMICAR 2g q6hr -- Continue Aygestin 5mg  daily to suppress menstrual cycle -- Hep B e antigen and antibody pending -- HBV viral load pending  Dizziness/Headache: No focal neurological deficits exhibited by patient but plan to closely monitor as she is at high risk for spontaneous bleed.  -- Continue neuro checks  -- PRN Tylenol and Tramadol for headache  Constipation--Improved: Patient had first bowel movement in 3  days last night  -- continue bowel regimen: Senna and Miralax  PPX -- DVT: SCDs -- On fall Precautions  Diet: Carb Modified Code Status: FULL  Dispo: pending medical improvement   This is a Psychologist, occupationalMedical Student Note.  The care of the patient was discussed with Dr.Truong and the assessment and plan formulated with their assistance.  Please see their attached note for official documentation of the daily encounter.   LOS: 10 days   Danelle Earthlyaryl Latorie Montesano, Med Student 09/07/2015, 8:38 AM

## 2015-09-07 NOTE — Progress Notes (Signed)
   Subjective Pt having a headache that she rates as 3/10. She had a non bloody BM yesterday. Still having reddish brown urine.   Objective: Vital signs in last 24 hours: Filed Vitals:   09/07/15 0309 09/07/15 0800 09/07/15 0820 09/07/15 0821  BP: 118/69 92/47 101/58 101/58  Pulse: 82 67 67 63  Temp:    98.1 F (36.7 C)  TempSrc:    Oral  Resp: 16 13 14 15   Height:      Weight:      SpO2: 98% 98% 99% 99%   Weight change:   Intake/Output Summary (Last 24 hours) at 09/07/15 1016 Last data filed at 09/07/15 0516  Gross per 24 hour  Intake      0 ml  Output    300 ml  Net   -300 ml   General: NAD, obese HEENT: Moist mucous membranes, no bleeding lesions, PERRLA, EOMI Lungs: CTAB, no wheezing or crackles Cardiac: RRR, no murmurs, rubs, or gallops GI: Soft, no guarding or rebound, non TTP Neuro: CN II-XII intact, 5/5 LE strength  Skin: warm and dry  Lab Results: Liver Function Tests:  Recent Labs Lab 09/05/15 0538  AST 19  ALT 27  ALKPHOS 49  BILITOT 0.2*  PROT 8.4*  ALBUMIN 2.8*   CBC:  Recent Labs Lab 09/06/15 0744 09/07/15 0455  WBC 6.4 6.4  NEUTROABS 4.2 4.0  HGB 11.4* 10.8*  HCT 35.3* 33.5*  MCV 83.3 84.0  PLT <5* <5*     Medications: I have reviewed the patient's current medications. Scheduled Meds: . sodium chloride   Intravenous Once  . aminocaproic acid  2 g Oral Q6H  . norethindrone  5 mg Oral Daily  . polyethylene glycol  17 g Oral Daily  . senna  2 tablet Oral Daily   Continuous Infusions:   PRN Meds:.acetaminophen, phenylephrine, traMADol, traZODone Assessment/Plan:  Severe thrombocytopenia 2/2 ITP- Platelet count remaining <5. S/p IVIG x 6/10-11 and 6/13 and decadron 6/10 -13 - hemo/onc following, received dose of n-plate and rituxan 5/16, recommendations appreciated - aygestin 5 mg daily to suppress menstrual cycle due to bleeding risk - HepB viral load pending - CBC daily - No pharmacologic VTE ppx  Dizziness- No focal  neurological deficits. Due to her increased fall risk and severe thrombocytopenia she will need to remain in bed or limited up with assistance. - cont neuro checks   Constipation:  -senna, miralax daily PRN while constipated  FULL CODE Diet: Carb mod VTE ppx: SCDs  Dispo: Disposition is deferred at this time, awaiting improvement of current medical problems.  The patient does have a current PCP (No Pcp Per Patient) and [facility] follow[facility]up [facility] after discharge.  LOS: 10 days   Denton Brickiana M Truong, MD 09/07/2015, 10:16 AM

## 2015-09-08 ENCOUNTER — Inpatient Hospital Stay (HOSPITAL_COMMUNITY): Payer: Medicaid Other

## 2015-09-08 LAB — CBC WITH DIFFERENTIAL/PLATELET
BASOS ABS: 0 10*3/uL (ref 0.0–0.1)
Basophils Relative: 0 %
EOS PCT: 2 %
Eosinophils Absolute: 0.1 10*3/uL (ref 0.0–0.7)
HEMATOCRIT: 32.8 % — AB (ref 36.0–46.0)
Hemoglobin: 10.7 g/dL — ABNORMAL LOW (ref 12.0–15.0)
LYMPHS PCT: 22 %
Lymphs Abs: 1.6 10*3/uL (ref 0.7–4.0)
MCH: 27 pg (ref 26.0–34.0)
MCHC: 32.6 g/dL (ref 30.0–36.0)
MCV: 82.8 fL (ref 78.0–100.0)
MONO ABS: 0.5 10*3/uL (ref 0.1–1.0)
MONOS PCT: 8 %
NEUTROS ABS: 4.9 10*3/uL (ref 1.7–7.7)
Neutrophils Relative %: 68 %
RBC: 3.96 MIL/uL (ref 3.87–5.11)
RDW: 13.4 % (ref 11.5–15.5)
WBC: 7.2 10*3/uL (ref 4.0–10.5)

## 2015-09-08 LAB — GLUCOSE, CAPILLARY
GLUCOSE-CAPILLARY: 106 mg/dL — AB (ref 65–99)
GLUCOSE-CAPILLARY: 151 mg/dL — AB (ref 65–99)

## 2015-09-08 MED ORDER — OXYCODONE HCL 5 MG PO TABS: 10.0000 mg | ORAL_TABLET | Freq: Four times a day (QID) | ORAL | Status: DC | PRN

## 2015-09-08 MED ORDER — OXYCODONE HCL 5 MG PO TABS
5.0000 mg | ORAL_TABLET | Freq: Four times a day (QID) | ORAL | Status: DC | PRN
  Administered 2015-09-08: 5 mg via ORAL
  Filled 2015-09-08: qty 1

## 2015-09-08 MED ORDER — OXYCODONE HCL 5 MG PO TABS
5.0000 mg | ORAL_TABLET | ORAL | Status: DC | PRN
  Administered 2015-09-08 – 2015-09-15 (×6): 5 mg via ORAL
  Filled 2015-09-08 (×7): qty 1

## 2015-09-08 NOTE — Progress Notes (Signed)
Subjective: Complains of persistent occipital headache overnight.  No focal neuro deficits noted.   Objective: Vital signs in last 24 hours: Filed Vitals:   09/07/15 2230 09/08/15 0239 09/08/15 0245 09/08/15 0700  BP: 132/73  136/61 97/60  Pulse: 97  79 66  Temp:  98.2 F (36.8 C)  98 F (36.7 C)  TempSrc:  Oral  Oral  Resp: Height:      Weight:      SpO2: 100%  99% 98%    Intake/Output Summary (Last 24 hours) at 09/08/15 1040 Last data filed at 09/08/15 0900  Gross per 24 hour  Intake    480 ml  Output    300 ml  Net    180 ml    Physical Exam HEENT: Extra occular movements intact; Pupils equal, round and reactive to light, petechiae seen in posterior oropharynx General: laying in bed, no acute distress Cardiovascular:  Regular rate and rhythm, no murmurs, no rubs, no gallops, good pulses bilaterally Pulmonary: clear to auscultation bilaterally Abdominal:  Obese abdomen, non-tender, normal active bowel sounds Extermities:  No peripheral edema Skin:  No petechiae seen on trunk, abdomen and extremities Neuro:  Alert and oriented, CN II-XII grossly intact, 5/5 strength in bilateral extremities   Lab Results:  Recent Labs  09/07/15 0455 09/08/15 0400  WBC 6.4 7.2  NEUTROABS 4.0 4.9  HGB 10.8* 10.7*  HCT 33.5* 32.8*  MCV 84.0 82.8  PLT <5* <5*   CBG:  Recent Labs  09/06/15 1658 09/07/15 0836 09/07/15 1215 09/07/15 1603 09/07/15 2110 09/08/15 0723  GLUCAP 110* 102* 100* 147* 178* 106*   Medications:  Scheduled Meds: . sodium chloride   Intravenous Once  . aminocaproic acid  2 g Oral Q6H  . norethindrone  5 mg Oral Daily  . polyethylene glycol  17 g Oral Daily  . senna  2 tablet Oral Daily   Continuous Infusions:   PRN Meds:.acetaminophen, oxyCODONE, phenylephrine, traMADol, traZODone  Imaging Non-contrast CT-scan of the brain TECHNIQUE: Contiguous axial images were obtained from the base of the skull through the vertex without  intravenous contrast.  COMPARISON: Multiple priors, most recently head CT 08/29/2015.  FINDINGS: There continue to be some very subtle hyperdensities in the high parietal regions bilaterally, very similar to prior study 08/29/2015. Based on correlation with coronal and sagittal reformations, these appear to occur at the gray-white interface, rather than being within the sulci. Accordingly, these are favored to represent areas of dystrophic calcification, and are of uncertain etiology (potential related to prior neurocysticercosis). No new signs of intra or extra-axial hemorrhage are identified. No acute intracranial abnormalities. Specifically, no definite findings of acute/subacute cerebral ischemia, no mass, mass effect, hydrocephalus or abnormal intra or extra-axial fluid collections. Visualized paranasal sinuses and mastoids are well pneumatized. No acute displaced skull fractures are identified.  IMPRESSION: 1. No evidence of acute intracranial hemorrhage. 2. Persistent subtle areas of hyperdensity which appear to be associated with the gray-white interface in the high parietal regions bilaterally, not favored to be related to hemorrhage at this time. 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 Electronically Signed  By: Trudie Reed M.D.  On: 09/08/2015 08:53  Problem List Principal Problem:   Acute ITP (HCC) Active Problems:   Chronic hepatitis B (HCC)   Nontraumatic subarachnoid hemorrhage (HCC)   Severe thrombocytopenia (HCC)   Assessment/Plan: [name] is a 40 year old spanish speaking woman with no significant PMH that presented with active  acute bleeding from gums who was found to have severe thrombocytopeniamost likely due to acute ITP.  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 Platelet count will continue to be monitored for  treatment response.   Hemoglobin is stable.  Place on strict bed rest due to worsening headache. Heme-Onc (Dr. Cyndie ChimeGranfortuna) following. Receiving AMICAR to decrease risk of bleeding from mucosal surfaces.  Consult surgery to familiarize them with patient as splenectomy will likely be needed.   -- Continue daily CBC w/ diff to monitor platelet count and hemoglobin -- Strict bed rest -- Continue AMICAR 2g q6hr -- Continue Aygestin 5mg  daily to suppress menstrual cycle -- Hep B e antigen and antibody pending -- HBV viral load pending -- Consult surgery  Headache: Patient continues to have worsening headache no controlled with current PRN medication. Patient had a mild SAH on admission and there is a concern for new or worsening SAH in the setting of severe thrombocytopenia.   Lack of focal neuro deficits is reassuring. Non contrast CT ordered due to worsening headache and was negative for acute bleed.  Stronger pain medication added for better control.  Patient will continue to be closely monitored. -- Continue neuro checks  -- continue PRN Tylenol and Tramadol  -- PRN oxycodone 5mg  q6h  added  Constipation--controlled -- continue bowel regimen: Senna and Miralax  PPX -- DVT: SCDs -- On fall Precautions  Diet: Carb Modified Code Status: FULL  Dispo: pending medical improvement   This is a Psychologist, occupationalMedical Student Note.  The care of the patient was discussed with Dr.Truong and the assessment and plan formulated with their assistance.  Please see their attached note for official documentation of the daily encounter.   LOS: 11 days   [name], Med Student 09/08/2015, 10:40 AM

## 2015-09-08 NOTE — Progress Notes (Signed)
   Subjective Pt having headache that is worse compared to prior days that she rates as 8/10. Denies any weakness.   Objective: Vital signs in last 24 hours: Filed Vitals:   09/08/15 0239 09/08/15 0245 09/08/15 0700 09/08/15 1109  BP:  136/61 97/60 103/51  Pulse:  79 66 89  Temp: 98.2 F (36.8 C)  98 F (36.7 C) 98.1 F (36.7 C)  TempSrc: Oral  Oral Oral  Resp:  20 16 23   Height:      Weight:      SpO2:  99% 98% 99%   Weight change:   Intake/Output Summary (Last 24 hours) at 09/08/15 1129 Last data filed at 09/08/15 0900  Gross per 24 hour  Intake    480 ml  Output    300 ml  Net    180 ml   General: NAD, obese HEENT: Moist mucous membranes, palette petechiae  Lungs: CTAB, no wheezing or crackles Cardiac: RRR, no murmurs, rubs, or gallops GI: Soft, no guarding or rebound, non TTP Neuro: CN II-XII intact, 5/5 LE strength  Skin: warm and dry  Lab Results: Liver Function Tests:  Recent Labs Lab 09/05/15 0538  AST 19  ALT 27  ALKPHOS 49  BILITOT 0.2*  PROT 8.4*  ALBUMIN 2.8*   CBC:  Recent Labs Lab 09/07/15 0455 09/08/15 0400  WBC 6.4 7.2  NEUTROABS 4.0 4.9  HGB 10.8* 10.7*  HCT 33.5* 32.8*  MCV 84.0 82.8  PLT <5* <5*     Medications: I have reviewed the patient's current medications. Scheduled Meds: . sodium chloride   Intravenous Once  . aminocaproic acid  2 g Oral Q6H  . norethindrone  5 mg Oral Daily  . polyethylene glycol  17 g Oral Daily  . senna  2 tablet Oral Daily   Continuous Infusions:   PRN Meds:.acetaminophen, oxyCODONE, phenylephrine, traMADol, traZODone Assessment/Plan:  Severe thrombocytopenia 2/2 ITP- Platelet count remaining <5. S/p IVIG x 6/10-11 and 6/13 and decadron 6/10 -13. Pt complaining of worse headache than usual, CT head wo contrast today was negative for bleeding.  - hemo/onc following, received dose of n-plate and rituxan 5/16, recommendations appreciated - aygestin 5 mg daily to suppress menstrual cycle due to  bleeding risk - HepB viral load pending - CBC daily - No pharmacologic VTE ppx - surgery consulted for splenectomy   Dizziness- No focal neurological deficits. Due to her increased fall risk and severe thrombocytopenia she will need to remain in bed or limited up with assistance. - cont neuro checks   Constipation:  -senna, miralax daily PRN while constipated  FULL CODE Diet: Carb mod VTE ppx: SCDs  Dispo: Disposition is deferred at this time, awaiting improvement of current medical problems.  The patient does have a current PCP (No Pcp Per Patient) and [facility] after discharge.  LOS: 11 days   Alice Brickiana M Bellina Tokarczyk, MD 09/08/2015, 11:29 AM

## 2015-09-08 NOTE — Progress Notes (Signed)
Patient ID: [name], female   DOB: 09/13/1975, 40 y.o.   MRN: [id number] Hematology: She has developed a persistent occipital headache overnight. CT negative for acute bleed. No further epistaxis. Minimal vaginal bleeding. Exam: alert & oriented. PERRL. Full EOMs. Motor strength 5/5. DTRs 1+ symmetric. Limited petecchiae posterior oropharynx stable c/w my exam yesterday.  Impression: Refractory ITP Still no platelet response d 5 post N-plate/Rituxan. It would be reasonable to get general surgery consult so they are familiar with patient. I anticipate need for splenectomy even if we see a response to medical therapy. Although headache can be a side effect of N-plate, it is certainly worrisome in current situation. OK to use codeine or oxycodone for headache.  Alice DarbyJames Sinia Antosh, MD, FACP  Hematology-Oncology/Internal Medicine [phone]

## 2015-09-09 LAB — CBC WITH DIFFERENTIAL/PLATELET
BASOS ABS: 0 10*3/uL (ref 0.0–0.1)
BASOS PCT: 0 %
Eosinophils Absolute: 0.2 10*3/uL (ref 0.0–0.7)
Eosinophils Relative: 2 %
HEMATOCRIT: 34.1 % — AB (ref 36.0–46.0)
Hemoglobin: 11.2 g/dL — ABNORMAL LOW (ref 12.0–15.0)
LYMPHS PCT: 14 %
Lymphs Abs: 1.2 10*3/uL (ref 0.7–4.0)
MCH: 27.5 pg (ref 26.0–34.0)
MCHC: 32.8 g/dL (ref 30.0–36.0)
MCV: 83.8 fL (ref 78.0–100.0)
Monocytes Absolute: 0.5 10*3/uL (ref 0.1–1.0)
Monocytes Relative: 7 %
NEUTROS ABS: 6.3 10*3/uL (ref 1.7–7.7)
NEUTROS PCT: 77 %
RBC: 4.07 MIL/uL (ref 3.87–5.11)
RDW: 13.7 % (ref 11.5–15.5)
WBC: 8.2 10*3/uL (ref 4.0–10.5)

## 2015-09-09 LAB — TSH: TSH: 3.939 u[IU]/mL (ref 0.350–4.500)

## 2015-09-09 MED ORDER — ACETAMINOPHEN 325 MG PO TABS: 650.0000 mg | ORAL_TABLET | Freq: Once | ORAL | Status: DC

## 2015-09-09 MED ORDER — ROMIPLOSTIM 250 MCG ~~LOC~~ SOLR
200.0000 ug | Freq: Once | SUBCUTANEOUS | Status: DC
Start: 2015-09-10 — End: 2015-09-10

## 2015-09-09 MED ORDER — SODIUM CHLORIDE 0.9 % IV SOLN: 375.0000 mg/m2 | Freq: Once | INTRAVENOUS | Status: DC

## 2015-09-09 MED ORDER — AMINOCAPROIC ACID 0.25 GM/ML PO SOLN
1.0000 g | ORAL | Status: DC | PRN
  Administered 2015-09-09 – 2015-09-13 (×8): 1 g via ORAL
  Filled 2015-09-09 (×14): qty 4

## 2015-09-09 MED ORDER — AMINOCAPROIC ACID 500 MG PO TABS
3.0000 g | ORAL_TABLET | Freq: Four times a day (QID) | ORAL | Status: DC
  Administered 2015-09-09 – 2015-09-12 (×11): 3 g via ORAL
  Filled 2015-09-09 (×13): qty 6

## 2015-09-09 MED ORDER — DIPHENHYDRAMINE HCL 50 MG/ML IJ SOLN: 25.0000 mg | Freq: Once | INTRAMUSCULAR | Status: DC

## 2015-09-09 NOTE — Progress Notes (Signed)
Pt gums still have minor bleeding, she is continuously changing the gauze, I called Doctor and she stated to keep pressure on gums, supplied pt with more gauzes.

## 2015-09-09 NOTE — Progress Notes (Signed)
Interpreter Wyvonnia DuskyGraciela Namihira for Surgery  PA Tinnie GensJeffrey

## 2015-09-09 NOTE — Progress Notes (Signed)
Subjective Pt buccal mucosal bleeding overnight that has improved this morning. She still has very slow ozzing from rt buccal side. She does not have a headache currently but had a 9/10 headache last night.   Objective: Vital signs in last 24 hours: Filed Vitals:   09/09/15 0031 09/09/15 0330 09/09/15 0717 09/09/15 0807  BP: 95/55 104/69 109/59 95/57  Pulse: 63 72 79 73  Temp: 97.8 F (36.6 C) 98.6 F (37 C)  98.5 F (36.9 C)  TempSrc: Oral Oral  Oral  Resp: 17 17 22 17   Height:      Weight:      SpO2: 96% 94% 98% 97%   Weight change:   Intake/Output Summary (Last 24 hours) at 09/09/15 1114 Last data filed at 09/09/15 0900  Gross per 24 hour  Intake   1080 ml  Output    950 ml  Net    130 ml   General: NAD, obese HEENT: Moist mucous membranes, rt buccal mucosal bleeding  Lungs: CTAB, no wheezing or crackles Cardiac: RRR, no murmurs, rubs, or gallops GI: Soft, no guarding or rebound, TTP of RLQ Neuro: CN II-XII intact Skin: warm and dry  Lab Results: Liver Function Tests:  Recent Labs Lab 09/05/15 0538  AST 19  ALT 27  ALKPHOS 49  BILITOT 0.2*  PROT 8.4*  ALBUMIN 2.8*   CBC:  Recent Labs Lab 09/08/15 0400 09/09/15 0504  WBC 7.2 8.2  NEUTROABS 4.9 6.3  HGB 10.7* 11.2*  HCT 32.8* 34.1*  MCV 82.8 83.8  PLT <5* <5*     Medications: I have reviewed the patient's current medications. Scheduled Meds: . sodium chloride   Intravenous Once  . [START ON 09/10/2015] acetaminophen  650 mg Oral Once  . aminocaproic acid  3 g Oral Q6H  . [START ON 09/10/2015] diphenhydrAMINE  25 mg Intravenous Once  . norethindrone  5 mg Oral Daily  . polyethylene glycol  17 g Oral Daily  . [START ON 09/10/2015] riTUXimab (RITUXAN) IV infusion  375 mg/m2 Intravenous Once  . [START ON 09/10/2015] romiPLOStim  200 mcg Subcutaneous Once  . 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 S/p IVIG x 6/10-11 and 6/13 and decadron 6/10 -13. CT head wo contrast 6/21 negative for bleeding. TSH nl.  - hemo/onc following, received dose of n-plate and rituxan 5/16, she will receive a 2nd dose of rituxan and n-plate tomorrow.  - increased dose of amicar to 3 grams q6h as well as amicar mouth wash for mucosal bleeding.  - aygestin 5 mg daily to suppress menstrual cycle due to bleeding risk - HepB viral load pending - Hep B e antibody positive, Hep B e antigen pending - CBC daily - surgery consulted for splenectomy. Likely can hold off on vaccinations for encapsulated organisms prior to splenectomy as she is receiving rituximab, can consider vaccination 2 weeks sx or 6 months after last dose of rituximab.   Dizziness- No focal neurological deficits. Due to her increased fall risk and severe thrombocytopenia she will need to remain in bed or limited up with assistance. Per physical therapy dizziness could be from b/l vestibular hypofunction  - cont neuro checks - vestibular rehab when thrombocytopenia resolves  Constipation:  -senna, miralax daily PRN while constipated  FULL CODE Diet: Carb mod VTE ppx: SCDs  Dispo: Disposition is deferred at this time,  awaiting improvement of current medical problems.  The patient does have a current PCP (No Pcp Per Patient) and [facility].  LOS: 12 days   Alice Brickiana M Fouad Taul, MD 09/09/2015, 11:14 AM

## 2015-09-09 NOTE — Progress Notes (Signed)
Physical Therapy Treatment Patient Details Name: [name] MRN: [id number] DOB: 12/29/1975 Today's Date: 09/09/2015    History of Present Illness [name] is a 39yo woman with no significant PMH who presents with gingival bleeding which she could not control. Apparently in the ED waiting room, she also developed a petechial rash on her LE, pannus and neck. Her platelet count was noted to be zero in the ED. CT head showed a subarrachnoid hemorrhage, and she complained of headache.Pt to have been diagnosed with thrombocytopenia    PT Comments    Patient progressing with standing exercises for vestibular habituation/adaptation.  Able to participate in balance training as well.  Feel continued skilled PT to progress exercises indicated during acute stay.   Follow Up Recommendations  No PT follow up;Supervision/Assistance - 24 hour     Equipment Recommendations  None recommended by PT    Recommendations for Other Services       Precautions / Restrictions Precautions Precautions: Fall Precaution Comments: dizzines/room spinning even in supine; severely low platelets    Mobility  Bed Mobility Overal bed mobility: Modified Independent                Transfers   Equipment used: None   Sit to Stand: Min guard         General transfer comment: for balance/safety  Ambulation/Gait             General Gait Details: ambulated few steps to be able to perform x1 exercises with target on the wall 5' away   Stairs            Wheelchair Mobility    Modified Rankin (Stroke Patients Only)       Balance             Standing balance-Leahy Scale: Fair                 High Level Balance Comments: standing balance activities: side stepping, forward and backward tandem, static tandem 30 sec with each foot position with HHA versus minguard throughout    Cognition Arousal/Alertness: Awake/alert Behavior During Therapy: WFL for  tasks assessed/performed Overall Cognitive Status: Within Functional Limits for tasks assessed                      Exercises Other Exercises Other Exercises: reviewed seated x 1 exercises with near target at eye level 30 sec horizontal and 30 sec vertical (worse dizziness with vertical movements) Other Exercises: Performed also in standing wtih near target on wall 30 sec each direction with minA for balance(8-9/10 spinning with vertical head movements and reported worse balance with horizontal head movement)    General Comments        Pertinent Vitals/Pain Faces Pain Scale: Hurts little more Pain Location: back Pain Descriptors / Indicators: Aching;Tightness Pain Intervention(s): Monitored during session;Repositioned    Home Living                      Prior Function            PT Goals (current goals can now be found in the care plan section) Progress towards PT goals: Progressing toward goals;Goals met and updated - see care plan    Frequency  Min 3X/week    PT Plan Current plan remains appropriate    Co-evaluation             End of Session   Activity Tolerance: Patient tolerated treatment well   Patient left: in bed;with call bell/phone within reach;with family/visitor present     Time: 1600-1620 PT Time Calculation (min) (ACUTE ONLY): 20 min  Charges:  $Neuromuscular Re-education: 8-22 mins                    G Codes:      Cynthia Wynn 09/09/2015, 5:11 PM  Cyndi Wynn, PT [phone] 09/09/2015    

## 2015-09-09 NOTE — Progress Notes (Signed)
Paged by nursing staff stating patient is having minor mucosal bleeding. We saw and evaulated the patent at bedside. Patient reports noticing a little bleeding from her gums. Also states she is currently on her menstrual cycle and is having some mild intermittent lower abdominal cramping. She has not other complaints and states she feels well otherwise.   Vitals: BP soft (95/55 Physical exam: Mouth: Tiny 0.5 x 0.5 cm area of clotted blood noticed in the mucosa adjacent to the left upper first pre-molar. No blood noticed in the mouth. Heart: RRR. S1 and S2 appreciated. Lungs: CTAB. No wheezes, rales, or rhonchi Abdomen: soft, non-tender, non-distended. No rebound, guarding, or rigidity  A/P: -Placed a small gauze over the area of blood clot. Asked nursing staff to inform us if the area starts bleeding. -Consider giving STAT platelet transfusion and recombinant factor 7a if patient has a major bleeding event or focal neuro deficits  -Abdominal exam was unremarkable - apply heating pads to lower abdomen for menstrual cramps    4:23 am: Went to bedside to check on patient. Patient is resting comfortably and is AAO x3. Blood clot on oral mucosa as above with very minimal oozing of blood. Gauze is in place. Vitals are stable on monitor.  -CTM

## 2015-09-09 NOTE — Progress Notes (Signed)
Subjective: Pt had minimal gingival bleeding overnight that was packed with gauze.  Also complained of mild lower abdominal cramping.  Headache has resolved.   Objective: Vital signs in last 24 hours: Filed Vitals:   09/09/15 0031 09/09/15 0330 09/09/15 0717 09/09/15 0807  BP: 95/55 104/69 109/59 95/57  Pulse: 63 72 79 73  Temp: 97.8 F (36.6 C) 98.6 F (37 C)  98.5 F (36.9 C)  TempSrc: Oral Oral  Oral  Resp: 17 17 22 17   Height:      Weight:      SpO2: 96% 94% 98% 97%    Intake/Output Summary (Last 24 hours) at 09/09/15 1041 Last data filed at 09/09/15 0900  Gross per 24 hour  Intake   1320 ml  Output    950 ml  Net    370 ml    Physical Exam General: laying in bed, no acute distress, gauge placed in mouth HEENT: active gingival bleeding around right lower molars Cardiovascular:  Regular rate and rhythm, no murmurs, no rubs, no gallops, good pulses bilaterally Pulmonary: clear to auscultation bilaterally Abdominal:  Obese abdomen, tender to deep palpation of LLQ, normal active bowel sounds Extermities:  No peripheral edema Skin:  No petechiae seen on trunk, abdomen and extremities Neuro:  Alert and oriented, CN II-XII grossly intact  Lab Results:  Recent Labs  09/08/15 0400 09/09/15 0504  WBC 7.2 8.2  NEUTROABS 4.9 6.3  HGB 10.7* 11.2*  HCT 32.8* 34.1*  MCV 82.8 83.8  PLT <5* <5*    Medications:  Scheduled Meds: . sodium chloride   Intravenous Once  . [START ON 09/10/2015] acetaminophen  650 mg Oral Once  . aminocaproic acid  3 g Oral Q6H  . [START ON 09/10/2015] diphenhydrAMINE  25 mg Intravenous Once  . norethindrone  5 mg Oral Daily  . polyethylene glycol  17 g Oral Daily  . [START ON 09/10/2015] riTUXimab (RITUXAN) IV infusion  375 mg/m2 Intravenous Once  . [START ON 09/10/2015] romiPLOStim  200 mcg Subcutaneous Once  . senna  2 tablet Oral Daily   Continuous Infusions:   PRN Meds:.acetaminophen, aminocaproic acid, oxyCODONE, phenylephrine,  traMADol, traZODone  Problem List Principal Problem:   Acute ITP (HCC) Active Problems:   Chronic hepatitis B (HCC)   Nontraumatic subarachnoid hemorrhage (HCC)   Severe thrombocytopenia (HCC)   Assessment/Plan: [name] is a 40 year old spanish speaking woman with no significant PMH that presented with active acute bleeding from gums who was found to have severe thrombocytopeniamost likely due to acute ITP.  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 Continues to have no improved response s/p NPLATE (Romiplostim) and Rituximab on 09/03/15. Gingival bleeding sequela of her severe thrombocytopenia. Hemodynamically stable. Pt  Is  Anti-HBs positive, Anti-HBc positive and HBsAg negative suggesting she has recovered from a past infection. Anti-HBe found to be positive indicating a very minimal or lack of HBV replication.  Anti-HBe usually remains detectable several years after recovery from an acute infection.  If HBV DNA is detectable then active viral replication may be present.  Hep B e antigen and HBV viral load pending.   Heme-Onc (Dr. Cyndie ChimeGranfortuna) following. PO AMICAR will be increased with added AMICAR mouthwash to prevent mucosal bleeding.  Will receive another dose of  Rituximab and a double dose of NPLATE  tomorrow.  Surgery consulted to familiarize themselves with patient in case an emergency splenectomy.  If patient remains stable and has some platelet response to therapy then elective splenectomy will  be planned.   -- Continue daily CBC w/ diff to monitor platelet count  -- Begin AMICAR 3g PO q6hr -- Begin PRN AMICAR oral solution q4h -- Continue Aygestin 5mg  daily to suppress menstrual cycle -- NPLATE 200 mcg SQ and Rituximab 800mg  IV tomorrow morning -- Hep B e antigen pending -- HBV viral load pending -- surgery consult pending  Headache--resolved: Headache adequately controlled with pain medication.  CT yesterday negative for acute bleed.  Headache as well as new  LLQ abdominal pain and cramping possibly a side effect of Aygestin.  Patient will continue to be closely monitored. -- Continue neuro checks  -- continue PRN pain mediations (Tylenol, Tramadol, Oxycodone)  Constipation--controlled -- continue bowel regimen: Senna and Miralax  PPX -- DVT: SCDs -- On fall Precautions  Diet: Carb Modified Code Status: FULL  Dispo: pending medical improvement   This is a Psychologist, occupationalMedical Student Note.  The care of the patient was discussed with Dr.Truong and the assessment and plan formulated with their assistance.  Please see their attached note for official documentation of the daily encounter.   LOS: 12 days   Danelle Earthlyaryl Ahlivia Salahuddin, Med Student 09/09/2015, 10:41 AM

## 2015-09-09 NOTE — Progress Notes (Addendum)
Patient ID: [name], female   DOB: 08/14/1975, 40 y.o.   MRN: [id number] Hematology: Headaches have resolved. New, low grade gingival bleeding which was her initial presenting sign Platelets remain undetectable Hemoglobin stable @ 11 gms Alert, oriented, PERRl, motor 5/5, full EOMS, DTR absent symmetric at knees Lungs clear, cor reg; calves non-tender  Impression: Refractory ITP She will get 2nd dose Rituxan & N-Plate tomorrow. Situation remains unstable with increased risk for major bleed every day platelet count stays down. I will increase AMICAR PO and add oral AMICAR mouthwash. Double dose of N-Plate tomorrow Surg consult pending to be on board in event of need for emergency splenectomy. Plan elective splenectomy if she remains stable and we can achieve some level of platelet response  Cephas DarbyJames Jade Burright, MD, FACP  Hematology-Oncology/Internal Medicine [phone]

## 2015-09-09 NOTE — Consult Note (Signed)
Reason for Consult:ITP Referring Physician: Cephas Holland  [name] is an 40 y.o. female.  HPI: [name] has been suffering from recalcitrant ITP. She has failed to respond to first and second line therapies and we were asked to see her for consideration of splenectomy. She has suffered some intercranial and gingival bleeding as a result of her profound thrombocytopenia. Dr. Cyndie Holland has asked [place] to follow along as we wait for a response to this last round of medicaitons with plans for splenectomy sometime next week unless it becomes an emergency.  Past Medical History  Diagnosis Date  . Depression     Past Surgical History  Procedure Laterality Date  . Breast surgery  biopsy    History reviewed. No pertinent family history.  Social History:  reports that she has never smoked. She does not have any smokeless tobacco history on file. She reports that she does not drink alcohol or use illicit drugs.  Allergies: No Known Allergies  Medications: I have reviewed the patient's current medications.  Results for orders placed or performed during the hospital encounter of 08/28/15 (from the past 48 hour(s))  Glucose, capillary     Status: Abnormal   Collection Time: 09/07/15  4:03 PM  Result Value Ref Range   Glucose-Capillary 147 (H) 65 - 99 mg/dL   Comment 1 Notify RN    Comment 2 Document in Chart   Glucose, capillary     Status: Abnormal   Collection Time: 09/07/15  9:10 PM  Result Value Ref Range   Glucose-Capillary 178 (H) 65 - 99 mg/dL  CBC with Differential/Platelet     Status: Abnormal   Collection Time: 09/08/15  4:00 AM  Result Value Ref Range   WBC 7.2 4.0 - 10.5 K/uL   RBC 3.96 3.87 - 5.11 MIL/uL   Hemoglobin 10.7 (L) 12.0 - 15.0 g/dL   HCT 29.5 (L) 62.1 - 30.8 %   MCV 82.8 78.0 - 100.0 fL   MCH 27.0 26.0 - 34.0 pg   MCHC 32.6 30.0 - 36.0 g/dL   RDW 65.7 84.6 - 96.2 %   Platelets <5 (LL) 150 - 400 K/uL    Comment: REPEATED TO VERIFY CRITICAL  VALUE NOTED.  VALUE IS CONSISTENT WITH PREVIOUSLY REPORTED AND CALLED VALUE. PLATELET COUNT CONFIRMED BY SMEAR    Neutrophils Relative % 68 %   Neutro Abs 4.9 1.7 - 7.7 K/uL   Lymphocytes Relative 22 %   Lymphs Abs 1.6 0.7 - 4.0 K/uL   Monocytes Relative 8 %   Monocytes Absolute 0.5 0.1 - 1.0 K/uL   Eosinophils Relative 2 %   Eosinophils Absolute 0.1 0.0 - 0.7 K/uL   Basophils Relative 0 %   Basophils Absolute 0.0 0.0 - 0.1 K/uL  Glucose, capillary     Status: Abnormal   Collection Time: 09/08/15  7:23 AM  Result Value Ref Range   Glucose-Capillary 106 (H) 65 - 99 mg/dL   Comment 1 Notify RN    Comment 2 Document in Chart   Glucose, capillary     Status: Abnormal   Collection Time: 09/08/15 11:44 AM  Result Value Ref Range   Glucose-Capillary 151 (H) 65 - 99 mg/dL   Comment 1 Notify RN    Comment 2 Document in Chart   TSH     Status: None   Collection Time: 09/08/15 10:44 PM  Result Value Ref Range   TSH 3.939 0.350 - 4.500 uIU/mL  CBC with Differential/Platelet     Status: Abnormal  Collection Time: 09/09/15  5:04 AM  Result Value Ref Range   WBC 8.2 4.0 - 10.5 K/uL   RBC 4.07 3.87 - 5.11 MIL/uL   Hemoglobin 11.2 (L) 12.0 - 15.0 g/dL   HCT 16.134.1 (L) 09.636.0 - 04.546.0 %   MCV 83.8 78.0 - 100.0 fL   MCH 27.5 26.0 - 34.0 pg   MCHC 32.8 30.0 - 36.0 g/dL   RDW 40.913.7 81.111.5 - 91.415.5 %   Platelets <5 (LL) 150 - 400 K/uL    Comment: REPEATED TO VERIFY PLATELET COUNT CONFIRMED BY SMEAR CRITICAL VALUE NOTED.  VALUE IS CONSISTENT WITH PREVIOUSLY REPORTED AND CALLED VALUE.    Neutrophils Relative % 77 %   Neutro Abs 6.3 1.7 - 7.7 K/uL   Lymphocytes Relative 14 %   Lymphs Abs 1.2 0.7 - 4.0 K/uL   Monocytes Relative 7 %   Monocytes Absolute 0.5 0.1 - 1.0 K/uL   Eosinophils Relative 2 %   Eosinophils Absolute 0.2 0.0 - 0.7 K/uL   Basophils Relative 0 %   Basophils Absolute 0.0 0.0 - 0.1 K/uL    Ct Head Wo Contrast  09/08/2015  CLINICAL DATA:  40 year old female with uncontrolled and  gingival bleeding. History of subarachnoid hemorrhage. EXAM: CT HEAD WITHOUT CONTRAST TECHNIQUE: Contiguous axial images were obtained from the base of the skull through the vertex without intravenous contrast. COMPARISON:  Multiple priors, most recently head CT 08/29/2015. FINDINGS: There continue to be some very subtle hyperdensities in the high parietal regions bilaterally, very similar to prior study 08/29/2015. Based on correlation with coronal and sagittal reformations, these appear to occur at the gray-white interface, rather than being within the sulci. Accordingly, these are favored to represent areas of dystrophic calcification, and are of uncertain etiology (potential related to prior neurocysticercosis). No new signs of intra or extra-axial hemorrhage are identified. No acute intracranial abnormalities. Specifically, no definite findings of acute/subacute cerebral ischemia, no mass, mass effect, hydrocephalus or abnormal intra or extra-axial fluid collections. Visualized paranasal sinuses and mastoids are well pneumatized. No acute displaced skull fractures are identified. IMPRESSION: 1. No evidence of acute intracranial hemorrhage. 2. Persistent subtle areas of hyperdensity which appear to be associated with the gray-white interface in the high parietal regions bilaterally, not favored to be related to hemorrhage at this time. 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 Electronically Signed   By: [name]  [name] M.D.   On: 09/08/2015 08:53    Review of Systems  Neurological: Positive for dizziness.  Endo/Heme/Allergies: Bruises/bleeds easily.  All other systems reviewed and are negative.  Blood pressure 99/54, pulse 80, temperature 98.7 F (37.1 C), temperature source Oral, resp. rate 19, height 5\' 1"  (1.549 m), weight 106.142 kg (234 lb), last menstrual period 08/23/2015, SpO2 99 %, currently breastfeeding. Physical Exam  Constitutional:  She appears well-developed and well-nourished. No distress.  HENT:  Head: Normocephalic.  Eyes: Conjunctivae are normal. Right eye exhibits no discharge. Left eye exhibits no discharge. No scleral icterus.  Neck: Normal range of motion. Neck supple.  Cardiovascular: Normal rate, regular rhythm and normal heart sounds.  Exam reveals no gallop and no friction rub.   No murmur heard. Respiratory: Effort normal and breath sounds normal. No respiratory distress. She has no wheezes. She has no rales.  GI: Soft. Bowel sounds are normal. She exhibits no distension. There is no tenderness. There is no rebound and no guarding.  Musculoskeletal: Normal range of motion.  Lymphadenopathy:    She has  no cervical adenopathy.  Neurological: She is alert.  Skin: Skin is warm and dry. She is not diaphoretic.  Psychiatric: She has a normal mood and affect. Her behavior is normal.    Assessment/Plan: ITP -- Surgery will be available to call in case she becomes emergent. Otherwise we will plan to formally check in on Monday to assess her response and plan her operation. Please call with questions in the meantime.    Freeman CaldronMichael J. Kashonda Sarkisyan, PA-C Pager: [phone] 09/09/2015, 2:19 PM

## 2015-09-09 NOTE — Progress Notes (Signed)
Pt gums are bleeding at the top on left side, contact the doctor and she stated that was coming to take a look at them tonight.

## 2015-09-09 NOTE — Progress Notes (Signed)
Paged by nursing staff stating patient is having minor mucosal bleeding. We saw and evaulated the patent at bedside. Patient reports noticing a little area of bleeding from her gums. Also states she is currently on her menstrual cycle and is having some mild intermittent lower abdominal cramping. Patient reports feeling well otherwise and does not have any other complaints - no CP or SOB.   Vitals: T 97.8 F, P 63, RR 17, BP 95/55, SpO2 96% on RA  Physical exam: Mouth: 0.5 x 0.5 cm area of clotted blood noticed in the mucosa adjacent to the left upper first pre-molar. No blood noticed in the mouth. Heart: RRR. S1 and S2 appreciated. Lungs: CTAB. No wheezes, rales, or rhonchi Abdomen: soft, non-tender, non-distended. No rebound, guarding, or rigidity  A/P: Patient is here for acute ITP with a low platelet count (<5). She has some very minor mucosal bleeding and blood has clotted in the area. No signs of major bleeding or AMS/ focal neuro deficits at this time. Her lower abdominal discomfort is likely secondary to menstrual cramps; abdominal exam is unremarkable.  -Placed a small gauze over the area of blood clot. Asked nursing staff to inform us if the area starts bleeding. -Consider giving STAT platelet transfusion and recombinant factor 7a if patient has a major bleeding event or focal neuro deficits  -Apply heating pads to lower abdomen for menstrual cramps  -CTM

## 2015-09-10 LAB — CBC WITH DIFFERENTIAL/PLATELET
Basophils Absolute: 0 10*3/uL (ref 0.0–0.1)
Basophils Relative: 0 %
EOS PCT: 2 %
Eosinophils Absolute: 0.2 10*3/uL (ref 0.0–0.7)
HCT: 34.4 % — ABNORMAL LOW (ref 36.0–46.0)
Hemoglobin: 11 g/dL — ABNORMAL LOW (ref 12.0–15.0)
LYMPHS ABS: 1.5 10*3/uL (ref 0.7–4.0)
LYMPHS PCT: 17 %
MCH: 26.6 pg (ref 26.0–34.0)
MCHC: 32 g/dL (ref 30.0–36.0)
MCV: 83.3 fL (ref 78.0–100.0)
MONO ABS: 0.5 10*3/uL (ref 0.1–1.0)
MONOS PCT: 5 %
Neutro Abs: 6.7 10*3/uL (ref 1.7–7.7)
Neutrophils Relative %: 75 %
RBC: 4.13 MIL/uL (ref 3.87–5.11)
RDW: 13.7 % (ref 11.5–15.5)
WBC: 8.9 10*3/uL (ref 4.0–10.5)

## 2015-09-10 LAB — HEPATITIS B E ANTIGEN: Hep B E Ag: NEGATIVE

## 2015-09-10 LAB — HEPATITIS B E ANTIBODY: Hep B E Ab: POSITIVE — AB

## 2015-09-10 MED ORDER — SODIUM CHLORIDE 0.9% FLUSH
10.0000 mL | INTRAVENOUS | Status: DC | PRN
Start: 2015-09-10 — End: 2015-09-22
  Administered 2015-09-12 – 2015-09-17 (×5): 10 mL
  Filled 2015-09-10 (×5): qty 40

## 2015-09-10 MED ORDER — ACETAMINOPHEN 325 MG PO TABS
650.0000 mg | ORAL_TABLET | Freq: Once | ORAL | Status: AC
  Administered 2015-09-10: 650 mg via ORAL
  Filled 2015-09-10: qty 2

## 2015-09-10 MED ORDER — ROMIPLOSTIM 250 MCG ~~LOC~~ SOLR
200.0000 ug | Freq: Once | SUBCUTANEOUS | Status: AC
  Administered 2015-09-10: 200 ug via SUBCUTANEOUS
  Filled 2015-09-10: qty 0.4

## 2015-09-10 MED ORDER — DIPHENHYDRAMINE HCL 50 MG/ML IJ SOLN
25.0000 mg | Freq: Once | INTRAMUSCULAR | Status: AC
  Administered 2015-09-10: 25 mg via INTRAVENOUS
  Filled 2015-09-10: qty 1

## 2015-09-10 MED ORDER — SODIUM CHLORIDE 0.9 % IV BOLUS (SEPSIS)
1000.0000 mL | Freq: Once | INTRAVENOUS | Status: AC
  Administered 2015-09-10: 1000 mL via INTRAVENOUS

## 2015-09-10 MED ORDER — SODIUM CHLORIDE 0.9 % IV SOLN
375.0000 mg/m2 | Freq: Once | INTRAVENOUS | Status: AC
  Administered 2015-09-10: 800 mg via INTRAVENOUS
  Filled 2015-09-10: qty 80

## 2015-09-10 NOTE — Care Management Note (Signed)
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 NCM will continue to follow for dc needs. Pt will need follow up appt with PCP and assistance with medication. Expected Discharge Date:                  Expected Discharge Plan:  Home/Self Care  In-House Referral:  Financial Counselor  Discharge planning Services  CM Consult  Post Acute Care Choice:    Choice offered to:     DME Arranged:    DME Agency:     HH Arranged:    HH Agency:     Status of Service:  In process, will continue to follow  If discussed at Long Length of Stay Meetings, dates discussed:    Additional Comments:  Elliot CousinShavis, Ryott Rafferty Ellen, RN 09/10/2015, 12:15 PM

## 2015-09-10 NOTE — Progress Notes (Signed)
Subjective: Pt continues to have minimal gingival bleeding that is packed with gauze.  Denies headache and dizziness  Objective: Vital signs in last 24 hours: Filed Vitals:   09/10/15 0514 09/10/15 0515 09/10/15 0742 09/10/15 0800  BP: 92/50 92/50 100/66 96/62  Pulse: 63 58 64 64  Temp: 97.4 F (36.3 C)  98.3 F (36.8 C)   TempSrc: Axillary  Oral   Resp: 15 16 15  0  Height:      Weight:      SpO2: 96% 96% 96% 94%    Intake/Output Summary (Last 24 hours) at 09/10/15 1119 Last data filed at 09/10/15 0519  Gross per 24 hour  Intake    840 ml  Output   2400 ml  Net  -1560 ml    Physical Exam General: alert, no acute distress, laying in bed, cooperative, gauze placed in mouth HEENT: small amount of active gingival bleeding around lower molars Cardiovascular:  Regular rate and rhythm, no murmurs, no rubs, no gallops, good pulses bilaterally Pulmonary: clear to auscultation bilaterally Abdominal:  Obese abdomen, non-tender, normal active bowel sounds Extermities:  No peripheral edema  Lab Results:  Recent Labs  09/09/15 0504 09/10/15 0506  WBC 8.2 8.9  NEUTROABS 6.3 6.7  HGB 11.2* 11.0*  HCT 34.1* 34.4*  MCV 83.8 83.3  PLT <5* <5*    Medications:  Scheduled Meds: . sodium chloride   Intravenous Once  . acetaminophen  650 mg Oral Once  . aminocaproic acid  3 g Oral Q6H  . diphenhydrAMINE  25 mg Intravenous Once  . norethindrone  5 mg Oral Daily  . polyethylene glycol  17 g Oral Daily  . riTUXimab (RITUXAN) IV infusion  375 mg/m2 Intravenous Once  . romiPLOStim  200 mcg Subcutaneous Once  . senna  2 tablet Oral Daily  . sodium chloride  1,000 mL Intravenous Once   Continuous Infusions:   PRN Meds:.acetaminophen, aminocaproic acid, oxyCODONE, phenylephrine, traMADol, traZODone  Problem List Principal Problem:   Acute ITP (HCC) Active Problems:   Chronic hepatitis B (HCC)   Nontraumatic subarachnoid hemorrhage (HCC)   Severe thrombocytopenia  (HCC)   Assessment/Plan: [name] is a 40 year old spanish speaking woman with no significant PMH that presented with active acute bleeding from gums who was found to have severe thrombocytopeniamost likely due to acute ITP.  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 Continues to have no improved response s/p NPLATE (Romiplostim) and Rituximab on 09/03/15. Gingival bleeding sequela of her severe thrombocytopenia. Hemodynamically stable. Pt  is Anti-HBs positive, Anti-HBc positive and HBsAg negative suggesting she has recovered from a past infection. Anti-HBe found to be positive indicating a very minimal or lack of HBV replication.  Anti-HBe usually remains detectable several years after recovery from an acute infection.  HBe antigen negative which is consistent with loss of HBV infectivity.  Heme-Onc (Dr. Cyndie ChimeGranfortuna) following. Another dose of Rituximab and a double dose of NPLATE today with Tyelenol, Benydral and IV fluids as prophylaxis for possible infusion related reactions.   Surgery consulted and on board in case emergency splenectomy needed.  Surgery will formally evaluate on Monday to assess patient response to therapy prior to surgical planning. -- Continue daily CBC w/ diff to monitor platelet count  -- Contine AMICAR for  -- Continue Aygestin 5mg  daily to suppress menstrual cycle -- NPLATE 200 mcg SQ and Rituximab 800mg  IV this afternoon -- Tylenol, Benadryl and NS bolus today as prophylaxis for possible infusion related reactions -- HBV viral  load pending  Headache--resolved: No complaints today.   Last CT (09/08/15) negative for acute bleed.  Patient will continue to be closely monitored. -- Continue neuro checks  -- continue PRN pain mediations (Tylenol, Tramadol, Oxycodone)  Constipation--controlled -- continue bowel regimen: Senna and Miralax  PPX -- DVT: SCDs -- On fall Precautions  Diet: Carb Modified Code Status: FULL  Dispo: pending medical  improvement  This is a Psychologist, occupationalMedical Student Note.  The care of the patient was discussed with Dr.Truong and the assessment and plan formulated with their assistance.  Please see their attached note for official documentation of the daily encounter.   LOS: 13 days   Danelle Earthlyaryl Meagan Ancona, Med Student 09/10/2015, 11:19 AM

## 2015-09-10 NOTE — Progress Notes (Signed)
Subjective Pt having gum bleeding and headache. No acute overnight events. Received amicar mouth wash once last night.   Objective: Vital signs in last 24 hours: Filed Vitals:   09/10/15 0514 09/10/15 0515 09/10/15 0742 09/10/15 0800  BP: 92/50 92/50 100/66 96/62  Pulse: 63 58 64 64  Temp: 97.4 F (36.3 C)  98.3 F (36.8 C)   TempSrc: Axillary  Oral   Resp: 15 16 15  0  Height:      Weight:      SpO2: 96% 96% 96% 94%   Weight change:   Intake/Output Summary (Last 24 hours) at 09/10/15 1008 Last data filed at 09/10/15 0519  Gross per 24 hour  Intake    840 ml  Output   2400 ml  Net  -1560 ml   General: NAD, obese HEENT: Moist mucous membranes, rt buccal mucosal bleeding  Lungs: clear on anterior auscultation Cardiac: RRR, no murmurs, rubs, or gallops GI: Soft, no guarding or rebound Neuro: CN II-XII intact Skin: warm and dry Ext: no pedal edema.  Lab Results: Liver Function Tests:  Recent Labs Lab 09/05/15 0538  AST 19  ALT 27  ALKPHOS 49  BILITOT 0.2*  PROT 8.4*  ALBUMIN 2.8*   CBC:  Recent Labs Lab 09/09/15 0504 09/10/15 0506  WBC 8.2 8.9  NEUTROABS 6.3 6.7  HGB 11.2* 11.0*  HCT 34.1* 34.4*  MCV 83.8 83.3  PLT <5* <5*     Medications: I have reviewed the patient's current medications. Scheduled Meds: . sodium chloride   Intravenous Once  . acetaminophen  650 mg Oral Once  . aminocaproic acid  3 g Oral Q6H  . diphenhydrAMINE  25 mg Intravenous Once  . norethindrone  5 mg Oral Daily  . polyethylene glycol  17 g Oral Daily  . riTUXimab (RITUXAN) IV infusion  375 mg/m2 Intravenous Once  . romiPLOStim  200 mcg Subcutaneous Once  . senna  2 tablet Oral Daily   Continuous Infusions:   PRN Meds:.acetaminophen, aminocaproic acid, oxyCODONE, phenylephrine, traMADol, traZODone Assessment/Plan:  Severe thrombocytopenia 2/2 ITP- Platelet count zero today. S/p IVIG x 6/10-11 and 6/13 and decadron 6/10 -13. CT head wo contrast 6/21 negative for  bleeding.  - hemo/onc following, received dose of n-plate and rituxan 5/16, she will receive a 2nd dose of rituxan and n-plate today - ordered 1L NS bolus in anticipation for hypotensive transfusion SE rituxan today, also getting benadryl and tylenol prior to infusion.  - continue amicar 3 grams q6h as well as amicar mouth wash for mucosal bleeding, discussed w/ RN today who will give amicar mouth wash as needed for mucousal bleeding as she only received it once last night despite active mucosal bleeding throughout the night.   - continue aygestin 5 mg daily  - HepB viral load pending - Hep B e antibody positive, Hep B e antigen negative - CBC daily - surgery following for splenectomy, will formally see pt again on Monday.   Dizziness- No focal neurological deficits. Due to her increased fall risk and severe thrombocytopenia she will need to remain in bed or limited up with assistance. Per physical therapy dizziness could be from b/l vestibular hypofunction  - cont neuro checks - vestibular rehab when thrombocytopenia resolves  Constipation:  -senna, miralax daily PRN while constipated  FULL CODE Diet: Carb mod VTE ppx: SCDs  Dispo: Disposition is deferred at this time, awaiting improvement of current medical problems.  The patient does have a current PCP (No Pcp Per  Patient) and [facility] after discharge.  LOS: 13 days   Alice Brickiana M Avner Stroder, MD 09/10/2015, 10:08 AM

## 2015-09-11 LAB — CBC WITH DIFFERENTIAL/PLATELET
BASOS PCT: 0 %
Basophils Absolute: 0 10*3/uL (ref 0.0–0.1)
EOS ABS: 0.2 10*3/uL (ref 0.0–0.7)
Eosinophils Relative: 3 %
HCT: 30.8 % — ABNORMAL LOW (ref 36.0–46.0)
HEMOGLOBIN: 10 g/dL — AB (ref 12.0–15.0)
Lymphocytes Relative: 18 %
Lymphs Abs: 1.3 10*3/uL (ref 0.7–4.0)
MCH: 27.2 pg (ref 26.0–34.0)
MCHC: 32.5 g/dL (ref 30.0–36.0)
MCV: 83.7 fL (ref 78.0–100.0)
MONOS PCT: 9 %
Monocytes Absolute: 0.6 10*3/uL (ref 0.1–1.0)
NEUTROS PCT: 70 %
Neutro Abs: 5 10*3/uL (ref 1.7–7.7)
PLATELETS: 6 10*3/uL — AB (ref 150–400)
RBC: 3.68 MIL/uL — ABNORMAL LOW (ref 3.87–5.11)
RDW: 13.8 % (ref 11.5–15.5)
WBC: 7.1 10*3/uL (ref 4.0–10.5)

## 2015-09-11 MED ORDER — WHITE PETROLATUM GEL
Status: AC
  Administered 2015-09-12
  Filled 2015-09-11: qty 1

## 2015-09-11 NOTE — Progress Notes (Signed)
Patient ID: [name], female   DOB: 05/27/1975, 40 y.o.   MRN: [id number] Hematology: She tolerated 2nd dose of N-Plate/Rituxan well. Still with gum bleeding. Scant vaginal bleeding. Melena likely from swallowed blood. Intermittent hematuria. On today's exam: submucosal bleeding inner lips with hematoma formation. PERRL, full EOMs, motor 5/5, reflexes absent at knees no change Hb drifting down: 10 gm Platelets: 6,000 on machine - at least not "<5,000" but too early to know if count stabilizing. Impression: Refractory ITP hospital d14; d8 post N-Plate/Rituxan dose 1, second dose yesterday. Rec: Continue close observation. Surgery on call in event of need for emergent splenectomy.    Cephas DarbyJames Granfortuna, MD, FACP  Hematology-Oncology/Internal Medicine [phone]

## 2015-09-11 NOTE — Progress Notes (Signed)
   Subjective Gum bleeding with slight increase now small hematomas in mouth that cause a stinging pain. Not having significant headache anymore. Several petechiae on her face without progression overnight. She does have some intermittent painless hematuria.  Objective: Vital signs in last 24 hours: Filed Vitals:   09/10/15 2028 09/11/15 0023 09/11/15 0335 09/11/15 0730  BP: 89/57 95/63 95/61  95/64  Pulse: 67 67 68 63  Temp: 97.8 F (36.6 C) 98 F (36.7 C) 97.9 F (36.6 C) 98.5 F (36.9 C)  TempSrc: Axillary Axillary Axillary Axillary  Resp: 28 18 13 18   Height:      Weight:      SpO2: 99% 98% 98% 97%   Weight change:   Intake/Output Summary (Last 24 hours) at 09/11/15 0911 Last data filed at 09/11/15 0730  Gross per 24 hour  Intake    480 ml  Output   1250 ml  Net   -770 ml   General: NAD, obese HEENT: Moist mucous membranes, small ~1cm hematoma on R buccal mucosa and left side sublingual Lungs: CTAB Cardiac: RRR, no murmurs, rubs, or gallops GI: Soft, no guarding or rebound Skin: Petechiae present on face, upper chest, arms Ext: no pedal edema  Lab Results: Liver Function Tests:  Recent Labs Lab 09/05/15 0538  AST 19  ALT 27  ALKPHOS 49  BILITOT 0.2*  PROT 8.4*  ALBUMIN 2.8*   CBC:  Recent Labs Lab 09/10/15 0506 09/11/15 0430  WBC 8.9 7.1  NEUTROABS 6.7 5.0  HGB 11.0* 10.0*  HCT 34.4* 30.8*  MCV 83.3 83.7  PLT <5* 6*     Medications: I have reviewed the patient's current medications. Scheduled Meds: . sodium chloride   Intravenous Once  . aminocaproic acid  3 g Oral Q6H  . norethindrone  5 mg Oral Daily  . polyethylene glycol  17 g Oral Daily  . senna  2 tablet Oral Daily   Continuous Infusions:   PRN Meds:.acetaminophen, aminocaproic acid, oxyCODONE, phenylephrine, sodium chloride flush, traMADol, traZODone Assessment/Plan: Severe thrombocytopenia 2/2 ITP- Platelet count reported at 6 today. Still critically low and too early to tell,  will need to see a trending improvement for good response to treatment. S/p IVIG x 6/10-11 and 6/13 and decadron 6/10 -13. CT head wo contrast 6/21 negative for bleeding. She continues to have small oral mucosa bleeds, petechiae, and hematuria with a mild downward trend in Hgb. - Continue Rituxan n-plate treatment 6/16>>> - continue aygestin 5 mg daily  - CBC daily - surgery following in case emergent splenectomy needed, will formally see pt again on Monday.   Dizziness- No focal neurological deficits. Not complaining of postural dizziness this morning but will continue strict fall precautions given critical thrombocytopenia. Given her prolonged stay and will continue to stay hospitalized ongoing skilled PT is a good idea. -Continue fall precautions/up with assist -Conitinue PT  FULL CODE Diet: Carb mod VTE ppx: SCDs  Dispo: Disposition is deferred at this time, awaiting improvement of current medical problems.  The patient does have a current PCP (No Pcp Per Patient) and [facility] after [facility].  LOS: 14 days   Fuller Planhristopher W Keilana Morlock, MD 09/11/2015, 9:11 AM

## 2015-09-12 LAB — CBC WITH DIFFERENTIAL/PLATELET
BASOS ABS: 0 10*3/uL (ref 0.0–0.1)
BASOS PCT: 0 %
EOS ABS: 0.3 10*3/uL (ref 0.0–0.7)
EOS PCT: 3 %
HCT: 31.3 % — ABNORMAL LOW (ref 36.0–46.0)
Hemoglobin: 10.4 g/dL — ABNORMAL LOW (ref 12.0–15.0)
Lymphocytes Relative: 15 %
Lymphs Abs: 1.3 10*3/uL (ref 0.7–4.0)
MCH: 28.1 pg (ref 26.0–34.0)
MCHC: 33.2 g/dL (ref 30.0–36.0)
MCV: 84.6 fL (ref 78.0–100.0)
MONO ABS: 0.5 10*3/uL (ref 0.1–1.0)
MONOS PCT: 5 %
Neutro Abs: 6.9 10*3/uL (ref 1.7–7.7)
Neutrophils Relative %: 76 %
RBC: 3.7 MIL/uL — ABNORMAL LOW (ref 3.87–5.11)
RDW: 13.8 % (ref 11.5–15.5)
WBC: 9 10*3/uL (ref 4.0–10.5)

## 2015-09-12 MED ORDER — AMINOCAPROIC ACID 250 MG/ML IV SOLN: 1.0000 g/h | Freq: Once | INTRAVENOUS | Status: DC

## 2015-09-12 MED ORDER — SODIUM CHLORIDE 0.9 % IV SOLN: 0.5000 g/h | Freq: Once | INTRAVENOUS | Status: DC

## 2015-09-12 MED ORDER — CETYLPYRIDINIUM CHLORIDE 0.05 % MT LIQD
7.0000 mL | Freq: Two times a day (BID) | OROMUCOSAL | Status: DC
  Administered 2015-09-13 – 2015-09-20 (×9): 7 mL via OROMUCOSAL

## 2015-09-12 MED ORDER — CHLORHEXIDINE GLUCONATE 0.12 % MT SOLN
15.0000 mL | Freq: Two times a day (BID) | OROMUCOSAL | Status: DC
  Administered 2015-09-12 (×2): 15 mL via OROMUCOSAL
  Filled 2015-09-12 (×2): qty 15

## 2015-09-12 MED ORDER — AMINOCAPROIC ACID 500 MG PO TABS
3.0000 g | ORAL_TABLET | Freq: Four times a day (QID) | ORAL | Status: DC
  Administered 2015-09-12 – 2015-09-13 (×5): 3 g via ORAL
  Filled 2015-09-12 (×6): qty 6

## 2015-09-12 NOTE — Progress Notes (Signed)
   Subjective No complaint of headache. Continues passing small amount of melena. Continues slow bleeding from oral mucosa at many sites, with mild stinging pain, hematomas decreased slightly in size compared to yesterday.  Objective: Vital signs in last 24 hours: Filed Vitals:   09/11/15 2051 09/12/15 0049 09/12/15 0306 09/12/15 0758  BP: 102/56 92/52 103/63 92/58  Pulse: 58 70 80 74  Temp: 97.8 F (36.6 C) 98.1 F (36.7 C) 98.2 F (36.8 C) 98.2 F (36.8 C)  TempSrc: Axillary Oral Oral Oral  Resp: 18 19 22 15   Height:      Weight:      SpO2: 97% 97% 96% 96%   Weight change:   Intake/Output Summary (Last 24 hours) at 09/12/15 1106 Last data filed at 09/12/15 0800  Gross per 24 hour  Intake    480 ml  Output    450 ml  Net     30 ml   General: NAD, obese HEENT: Moist mucous membranes, small ~1cm hematoma on R buccal mucosa and left side sublingual surface, scattered petechial lesions throughout palate and along gingiva Lungs: Clear to auscultation anteriorly Cardiac: RRR, no murmurs, rubs, or gallops GI: Soft, no guarding or rebound Skin: Petechiae present on face, upper chest, arms Ext: no pedal edema  Lab Results: Liver Function Tests: No results for input(s): AST, ALT, ALKPHOS, BILITOT, PROT, ALBUMIN in the last 168 hours. CBC:  Recent Labs Lab 09/11/15 0430 09/12/15 0327  WBC 7.1 9.0  NEUTROABS 5.0 6.9  HGB 10.0* 10.4*  HCT 30.8* 31.3*  MCV 83.7 84.6  PLT 6* <5*     Medications: I have reviewed the patient's current medications. Scheduled Meds: . sodium chloride   Intravenous Once  . aminocaproic acid (AMICAR) IVPB  0.5 g/hr Intravenous Once  . aminocaproic acid  3 g Oral Q6H  . [START ON 09/13/2015] antiseptic oral rinse  7 mL Mouth Rinse BID  . chlorhexidine  15 mL Mouth Rinse BID  . norethindrone  5 mg Oral Daily  . polyethylene glycol  17 g Oral Daily  . senna  2 tablet Oral Daily   Continuous Infusions:   PRN Meds:.acetaminophen,  aminocaproic acid, oxyCODONE, phenylephrine, sodium chloride flush, traMADol, traZODone Assessment/Plan: Severe thrombocytopenia 2/2 ITP- Platelet count undetectable today. S/p IVIG x 6/10-11 and 6/13 and decadron 6/10 -13. CT head wo contrast 6/21 negative for bleeding. She continues to have small oral mucosa bleeds, petechiae, and hematuria with a mild downward trend in Hgb. She is still early in treatment course to know if response to Rituxan will improve. - Continue Rituxan 6/16>>> - continue aygestin 5 mg daily  - CBC daily - surgery following in case emergent splenectomy needed, will formally see pt again on Monday.   Dizziness- Not complaining of postural dizziness this morning during examination. Fall precautions due to critical thrombocytopenia and risk of further intracranial hemorrhage. -Continue fall precautions/up with assist -Conitinue PT  FULL CODE Diet: Carb mod VTE ppx: SCDs  Dispo: Disposition is deferred at this time, awaiting improvement of current medical problems.  The patient does have a current PCP (No Pcp Per Patient) and does not [facility] follow[facility] appointment after discharge.  LOS: 15 days   Fuller Planhristopher W Rice, MD 09/12/2015, 11:06 AM

## 2015-09-13 DIAGNOSIS — Z8619 Personal history of other infectious and parasitic diseases: Secondary | ICD-10-CM

## 2015-09-13 LAB — CBC WITH DIFFERENTIAL/PLATELET
BASOS ABS: 0 10*3/uL (ref 0.0–0.1)
BASOS PCT: 0 %
EOS PCT: 6 %
Eosinophils Absolute: 0.5 10*3/uL (ref 0.0–0.7)
HEMATOCRIT: 30.4 % — AB (ref 36.0–46.0)
Hemoglobin: 9.8 g/dL — ABNORMAL LOW (ref 12.0–15.0)
Lymphocytes Relative: 12 %
Lymphs Abs: 1 10*3/uL (ref 0.7–4.0)
MCH: 26.6 pg (ref 26.0–34.0)
MCHC: 32.2 g/dL (ref 30.0–36.0)
MCV: 82.6 fL (ref 78.0–100.0)
MONO ABS: 0.7 10*3/uL (ref 0.1–1.0)
Monocytes Relative: 8 %
NEUTROS ABS: 6.4 10*3/uL (ref 1.7–7.7)
Neutrophils Relative %: 75 %
RBC: 3.68 MIL/uL — ABNORMAL LOW (ref 3.87–5.11)
RDW: 13.7 % (ref 11.5–15.5)
WBC: 8.5 10*3/uL (ref 4.0–10.5)

## 2015-09-13 MED ORDER — AMINOCAPROIC ACID 0.25 GM/ML PO SOLN
1.0000 g | ORAL | Status: DC
  Administered 2015-09-13 – 2015-09-22 (×48): 1 g via ORAL
  Filled 2015-09-13 (×56): qty 4

## 2015-09-13 NOTE — Progress Notes (Signed)
Paged by nursing staff stating patient is having increased mucosal bleeding. We saw and evaluated the patient at bedside. Patient noted to have slow oral mucosal bleeding and clotted blood on her gums. She was awake and alert. BP 93/63 but has been soft all day yesterday. She is not tachycardic. Review of medication record revealed patient has not been receiving Amicar mouth rinse. She has been using Peridex mouthwash. In addition, it was noted that the patient was using swabs, manipulating the gauze dressing in her mouth, and suctioning her mouth very frequently.  Heart: RRR. S1 and S2 appreciated. Lungs: CTAB. Abdomen: Soft, nontender, nondistended, positive bowel sounds. No guarding.  A/P: Swabs, suctioning, and frequent manipulation of gauze dressing may be exacerbating patient's oral mucosal bleeding. Major bleeding less likely as patient is currently hemodynamically stable. -Advised nursing staff to use Amicar 25% solution 1 g to rinse mouth every 4 hours as needed -Consider scheduling Amicar mouthwash  -Continue Amicar 3 g tablet by mouth every 6 hours -Advised nursing staff to decrease the frequency of oral suctioning, decreased use of swabs, and make sure the patient does not manipulate the gauze dressing in in her mouth as much. -Discontinue Peridex mouthwash to reduce mucosal irritation  -Consider giving STAT platelet transfusion and recombinant factor 7a if patient has a major bleeding event or focal neuro deficits   4 am: Went to bedside to check on patient. She awake, alert, and resting comfortably. Gauze is in place. Repeat BP 99/53. Remainder of vitals are stable on monitor - pulse in 70s.

## 2015-09-13 NOTE — Progress Notes (Signed)
MD notified about increase oral bleeding. MD came to bedside and assessed the patient. MD wants the PRN Amicar oral rinse to be used and try to keep oral packing for longer amounts of time. I will continue to monitor.

## 2015-09-13 NOTE — Progress Notes (Signed)
  Subjective: Complains of feeling weak and bleeding from gums  Objective: Vital signs in last 24 hours: Temp:  [98.3 F (36.8 C)-99.3 F (37.4 C)] 99.3 F (37.4 C) (06/26 0700) Pulse Rate:  [69-81] 73 (06/26 0700) Resp:  [16-24] 17 (06/26 0700) BP: (86-109)/(46-63) 86/55 mmHg (06/26 0700) SpO2:  [95 %-98 %] 95 % (06/26 0700) Last BM Date: 09/11/15  Intake/Output from previous day: 06/25 0701 - 06/26 0700 In: 1200 [P.O.:1200] Out: 1225 [Urine:1225] Intake/Output this shift: Total I/O In: -  Out: 400 [Urine:400]  Resp: clear to auscultation bilaterally Cardio: regular rate and rhythm GI: soft, non-tender; bowel sounds normal; no masses,  no organomegaly  Lab Results:   Recent Labs  09/12/15 0327 09/13/15 0258  WBC 9.0 8.5  HGB 10.4* 9.8*  HCT 31.3* 30.4*  PLT <5* <5*   BMET No results for input(s): NA, K, CL, CO2, GLUCOSE, BUN, CREATININE, CALCIUM in the last 72 hours. PT/INR No results for input(s): LABPROT, INR in the last 72 hours. ABG No results for input(s): PHART, HCO3 in the last 72 hours.  Invalid input(s): PCO2, PO2  Studies/Results: No results found.  Anti-infectives: Anti-infectives    None      Assessment/Plan: s/p * No surgery found * She seems to be refractory to medical management. I agree that splenectomy may improve her platelet count but operating on her with a platelet count less than 5,000 is very risky. If we can get her platelet count higher then she may be a candidate for surgery with less bleeding risk. Will follow  LOS: 16 days    TOTH III,Brandley Aldrete S 09/13/2015

## 2015-09-13 NOTE — Progress Notes (Signed)
Patient ID: [name], female   DOB: 08/26/1975, 40 y.o.   MRN: [id number] Hematology: Increased oropharyngeal bleeding. Tiny left scleral hemorrhage. PERRL. No focal neuro deficits. Still no improvement in platelet count. Hemoglobin drifting down. Impression: #1. Refractory ITP  Although I believe N-Plate will work once we hit optimal dose, I am concerned with increased bleeding. Decision difficult but I am recommending splenectomy at this time. I am open to input from surgery & medical team. Discussed w patient & daughter.  #2. Previous Hepatitis B infection Evidence of immunity with HBSag negative, surface antibody positive, negative e-antigen, e-antibody positive

## 2015-09-13 NOTE — Progress Notes (Signed)
   Subjective Some increase in bleeding from oral mucosa overnight. She continues to have generalized weakness and lightheadedness worsened with postural changes and walking.  Objective: Vital signs in last 24 hours: Filed Vitals:   09/13/15 0328 09/13/15 0403 09/13/15 0700 09/13/15 1100  BP: 93/63 99/53 86/55  89/47  Pulse: 69 69 73 71  Temp: 98.8 F (37.1 C)  99.3 F (37.4 C) 98.9 F (37.2 C)  TempSrc: Axillary  Axillary Axillary  Resp: 24 19 17 18   Height:      Weight:      SpO2: 96% 95% 95% 96%   Weight change:   Intake/Output Summary (Last 24 hours) at 09/13/15 1328 Last data filed at 09/13/15 0809  Gross per 24 hour  Intake    480 ml  Output   1625 ml  Net  -1145 ml   General: NAD, obese HEENT: gingival and petechial bleeding on buccal mucosa as well as palette, few scattered nontender petechiae on face Lungs: CTAB Cardiac: RRR, no murmurs, rubs, or gallops GI: Soft, no guarding or rebound Skin: Petechiae present on face, upper chest, arms Ext: no pedal edema  Lab Results: Liver Function Tests: No results for input(s): AST, ALT, ALKPHOS, BILITOT, PROT, ALBUMIN in the last 168 hours. CBC:  Recent Labs Lab 09/12/15 0327 09/13/15 0258  WBC 9.0 8.5  NEUTROABS 6.9 6.4  HGB 10.4* 9.8*  HCT 31.3* 30.4*  MCV 84.6 82.6  PLT <5* <5*     Medications: I have reviewed the patient's current medications. Scheduled Meds: . sodium chloride   Intravenous Once  . aminocaproic acid  1 g Oral Q4H  . antiseptic oral rinse  7 mL Mouth Rinse BID  . norethindrone  5 mg Oral Daily  . polyethylene glycol  17 g Oral Daily  . 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 S/p IVIG x 6/10-11 and 6/13 and decadron 6/10 -13. CT head wo contrast 6/21 negative for bleeding. She has an  increase in her oral mucosa bleeds with a mild downward trend in Hgb. So far no significant clinical response to first or second line medical treatment. She currently has worsening symptoms of minor bleeding without a major bleeding event, but her daily risk is substantial particularly with her initial presenting punctate intracranial hemorrhage. - Continue Rituxan 6/16>>> - continue aygestin 5 mg daily  - CBC daily - Extremely high surgical risk for splenectomy at this time but failing all available medical therapies, will coordinate planning with surgery and hematology service recommendations  Dizziness- Persistent postural dizziness. She also reports some generalized weakness may be from deconditioning as her anemia remains mild with Hgb of 9.8. No focal deficits. -Continue fall precautions/up with assist -Continue PT  FULL CODE Diet: Carb mod VTE ppx: SCDs  Dispo: Disposition is deferred at this time, awaiting improvement of current medical problems.  The patient does have a current PCP (No Pcp Per Patient) and [facility] not [facility] after discharge.  LOS: 16 days   Fuller Planhristopher W Yaritsa Savarino, MD 09/13/2015, 1:28 PM

## 2015-09-13 NOTE — Progress Notes (Signed)
Interpreter Graciela Namihira for patient °

## 2015-09-14 LAB — CBC WITH DIFFERENTIAL/PLATELET
BASOS PCT: 0 %
Basophils Absolute: 0 10*3/uL (ref 0.0–0.1)
EOS ABS: 0.7 10*3/uL (ref 0.0–0.7)
EOS PCT: 7 %
HCT: 29.6 % — ABNORMAL LOW (ref 36.0–46.0)
Hemoglobin: 9.6 g/dL — ABNORMAL LOW (ref 12.0–15.0)
LYMPHS ABS: 1 10*3/uL (ref 0.7–4.0)
Lymphocytes Relative: 11 %
MCH: 26.7 pg (ref 26.0–34.0)
MCHC: 32.4 g/dL (ref 30.0–36.0)
MCV: 82.2 fL (ref 78.0–100.0)
MONOS PCT: 8 %
Monocytes Absolute: 0.8 10*3/uL (ref 0.1–1.0)
NEUTROS ABS: 7 10*3/uL (ref 1.7–7.7)
NEUTROS PCT: 74 %
PLATELETS: 11 10*3/uL — AB (ref 150–400)
RBC: 3.6 MIL/uL — ABNORMAL LOW (ref 3.87–5.11)
RDW: 13.8 % (ref 11.5–15.5)
WBC: 9.4 10*3/uL (ref 4.0–10.5)

## 2015-09-14 MED ORDER — DEXTROSE-NACL 5-0.45 % IV SOLN
INTRAVENOUS | Status: AC
  Administered 2015-09-14 (×2): via INTRAVENOUS

## 2015-09-14 NOTE — Progress Notes (Signed)
Patient ID: [name], female   DOB: 02/24/1976, 40 y.o.   MRN: [id number] Hematology: Platelets 11,000 today! First break in the clouds since admission. Hopefully trend will continue. Persistent oro/gingival bleeding. Scant vaginal bleeding. Denies headache. Neuro exam remains non focal. Lungs clear.No calf swelling/tenderness. Situation discussed with Dr Carolynne Edouardoth, general surgery. Plan for splenectomy on 6/29. I will continue AMICAR through surgery until platelet count >/= 50,000. Dose 3 N-Plate & Rituxan for 6/30. Impression: Refractory ITP Plan as above

## 2015-09-14 NOTE — Progress Notes (Signed)
Interpreter Wyvonnia DuskyGraciela Namihira for PT and MD

## 2015-09-14 NOTE — Progress Notes (Signed)
   Subjective No acute events overnight. She continues to have ongoing mucosal bleeds although more clotting is present in her mouth compared to yesterday. This is mildly painful but she feels it is adequately controlled.  Objective: Vital signs in last 24 hours: Filed Vitals:   09/13/15 2244 09/13/15 2247 09/14/15 0320 09/14/15 0825  BP:  109/63 98/59 94/60   Pulse:  90 92 77  Temp: 98 F (36.7 C)  99.7 F (37.6 C) 98.8 F (37.1 C)  TempSrc: Axillary  Oral Axillary  Resp:  15 20 23   Height:      Weight:      SpO2:  97% 96% 99%   Weight change:  No intake or output data in the 24 hours ending 09/14/15 0940 General: NAD, obese HEENT: gingival and petechial bleeding on buccal mucosa as well as palette, large amount of clots around lower gingiva Lungs: CTAB Cardiac: RRR, no murmurs, rubs, or gallops Abdomen: Soft, no guarding or rebound Skin: Petechiae present on face, upper chest, arms Extremities: no pedal edema  CBC:  Recent Labs Lab 09/13/15 0258 09/14/15 0515  WBC 8.5 9.4  NEUTROABS 6.4 7.0  HGB 9.8* 9.6*  HCT 30.4* 29.6*  MCV 82.6 82.2  PLT <5* 11*     Medications: I have reviewed the patient's current medications. Scheduled Meds: . sodium chloride   Intravenous Once  . aminocaproic acid  1 g Oral Q4H  . antiseptic oral rinse  7 mL Mouth Rinse BID  . norethindrone  5 mg Oral Daily  . polyethylene glycol  17 g Oral Daily  . 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 S/p IVIG x 6/10-11 and 6/13 and decadron 6/10 -13. Initial had CT showing punctate hemorrhages, with repeat CT head wo contrast 6/21 negative for bleeding. She has ongoing mucosal bleeds with a mild downward trend in Hgb. So far no significant clinical response to first or second line medical  treatment. She currently has worsening symptoms of minor bleeding without a major bleeding event, but her daily risk is substantial. Small rise in platelet count today to 11, we will continue to monitor and hope she maintains an improvement to decrease her surgical risk but she may not sustain this rise. - Continue Rituxan 6/16>>> - continue aygestin 5 mg daily  - CBC daily - Coordinating plan with surgery service, currently anticipate splenectomy on 6/29  Dizziness- Persistent postural dizziness. She also reports some generalized weakness that may be from deconditioning as her anemia remains mild with Hgb of 9.6. No focal deficits. Therapy interventions have remained limited due to her very high risk of major injury with any fall. -Continue fall precautions/up with assist -Continue PT  FULL CODE Diet: Carb mod VTE ppx: SCDs  Dispo: Disposition is deferred at this time, awaiting improvement of current medical problems.  The patient does have a current PCP (No Pcp Per Patient) and [facility] after discharge.  LOS: 17 days   Fuller Planhristopher W Rice, MD 09/14/2015, 9:40 AM

## 2015-09-14 NOTE — Progress Notes (Signed)
Notified Dr. Dimple Caseyice about patient's blood pressure. Pt has been running a soft pressure a lot of this admission and he stated he did not want to give patient a bolus at this time, will consider if consisently low. Will continue to monitor and reassess. Pt is not currently symptomatic.

## 2015-09-14 NOTE — Progress Notes (Signed)
Physical Therapy Treatment Patient Details Name: [name] MRN: [id number] DOB: 08/16/1975 Today's Date: 09/14/2015    History of Present Illness [name] is a 40yo woman with no significant PMH who presents with gingival bleeding which she could not control. Apparently in the ED waiting room, she also developed a petechial rash on her LE, pannus and neck. Her platelet count was noted to be zero in the ED. CT head showed a subarrachnoid hemorrhage, and she complained of headache.Pt to have been diagnosed with thrombocytopenia    PT Comments    Patient reports intense headache with sitting EOB today. BP improving- EOB 111/69, supine 119/69. Tolerated sitting EOB performing exercises for vestibular habituation/adaptation- reports worsened spinning with horizontal head movements today. Deferred standing balance due to intense headache. Platelets improved today to 11. Will continue to follow.   Follow Up Recommendations  No PT follow up;Supervision/Assistance - 24 hour     Equipment Recommendations  None recommended by PT    Recommendations for Other Services       Precautions / Restrictions Precautions Precautions: Fall Precaution Comments: dizzines/room spinning even in supine; severely low platelets Restrictions Weight Bearing Restrictions: No    Mobility  Bed Mobility Overal bed mobility: Modified Independent             General bed mobility comments: hob flat, used bed rail, increased time due to body habitus.  Transfers                 General transfer comment: Deferred standing due to intense dizziness with sitting EOB and fall risk.  Ambulation/Gait                 Stairs            Wheelchair Mobility    Modified Rankin (Stroke Patients Only)       Balance Overall balance assessment: Needs assistance Sitting-balance support: Feet supported;No upper extremity supported Sitting balance-Leahy Scale:  Good Sitting balance - Comments: ABle to perform exercises sitting EOB without LOB or difficulty.    Standing balance support: During functional activity                        Cognition Arousal/Alertness: Awake/alert Behavior During Therapy: WFL for tasks assessed/performed Overall Cognitive Status: Within Functional Limits for tasks assessed                      Exercises General Exercises - Lower Extremity Long Arc Quad: Both;20 reps;Seated Hip ABduction/ADduction: Both;20 reps;Seated Hip Flexion/Marching: Both;20 reps;Seated Other Exercises Other Exercises: Performed seated x 1 exercises with near target at eye level 30 sec horizontal and 30 sec vertical (worse dizziness with horizontal movements) x2 with rest breaks Other Exercises: Deferred standing exercises due to intense headache.    General Comments General comments (skin integrity, edema, etc.): Reports feeling like she was going to fall over during x1 exercises.       Pertinent Vitals/Pain Pain Assessment: Faces Faces Pain Scale: Hurts whole lot Pain Location: head during exercises Pain Descriptors / Indicators: Pounding Pain Intervention(s): Monitored during session;Repositioned    Home Living                      Prior Function            PT Goals (current goals can now be found in the care plan section) Progress towards PT goals: Progressing toward goals (limited due to headacbe)  Frequency  Min 3X/week    PT Plan Current plan remains appropriate    Co-evaluation             End of Session   Activity Tolerance: Patient tolerated treatment well Patient left: in bed;with call bell/phone within reach;with family/visitor present     Time: [id number] PT Time Calculation (min) (ACUTE ONLY): 20 min  Charges:  $Neuromuscular Re-education: 8-22 mins                    G Codes:      Venecia Mehl A Eddi Hymes 09/14/2015, 2:48 PM Mylo RedShauna Lynasia Meloche, PT, DPT [phone]

## 2015-09-14 NOTE — Progress Notes (Signed)
Patient ID: [name], female   DOB: 08/05/1975, 40 y.o.   MRN: [id number]     [facility]      [address]   [place], [place] [id number]    Phone: [phone] FAX: [phone]     Subjective: PLT 11k. With gum and nose bleeding. BP soft, but okay. C/o mild llq abd pain. Tolerating POs.   Objective:  Vital signs:  Filed Vitals:   09/13/15 2244 09/13/15 2247 09/14/15 0320 09/14/15 0825  BP:  109/63 98/59 94/60   Pulse:  90 92 77  Temp: 98 F (36.7 C)  99.7 F (37.6 C) 98.8 F (37.1 C)  TempSrc: Axillary  Oral Axillary  Resp:  15 20 23   Height:      Weight:      SpO2:  97% 96% 99%    Last BM Date: 09/14/15  Intake/Output   Yesterday:  06/26 0701 - 06/27 0700 In: -  Out: 400 [Urine:400] This shift: I/O last 3 completed shifts: In: -  Out: 400 [Urine:400]    Physical Exam: General: [name] awake/alert/oriented x4 in no acute distress Abdomen: Soft.  Nondistended.  Non tender.  No evidence of peritonitis.  No incarcerated hernias.    Problem List:   Principal Problem:   Acute ITP (HCC) Active Problems:   Chronic hepatitis B (HCC)   Nontraumatic subarachnoid hemorrhage (HCC)   Severe thrombocytopenia (HCC)    Results:   Labs: Results for orders placed or performed during the hospital encounter of 08/28/15 (from the past 48 hour(s))  CBC with Differential/Platelet     Status: Abnormal   Collection Time: 09/13/15  2:58 AM  Result Value Ref Range   WBC 8.5 4.0 - 10.5 K/uL   RBC 3.68 (L) 3.87 - 5.11 MIL/uL   Hemoglobin 9.8 (L) 12.0 - 15.0 g/dL   HCT 73.230.4 (L) 20.236.0 - 54.246.0 %   MCV 82.6 78.0 - 100.0 fL   MCH 26.6 26.0 - 34.0 pg   MCHC 32.2 30.0 - 36.0 g/dL   RDW 70.613.7 23.711.5 - 62.815.5 %   Platelets <5 (LL) 150 - 400 K/uL    Comment: REPEATED TO VERIFY CRITICAL VALUE NOTED.  VALUE IS CONSISTENT WITH PREVIOUSLY REPORTED AND CALLED VALUE.    Neutrophils Relative % 75 %   Neutro Abs 6.4 1.7 - 7.7 K/uL   Lymphocytes Relative 12 %   Lymphs Abs 1.0 0.7 - 4.0 K/uL   Monocytes Relative 8 %   Monocytes Absolute 0.7 0.1 - 1.0 K/uL   Eosinophils Relative 6 %   Eosinophils Absolute 0.5 0.0 - 0.7 K/uL   Basophils Relative 0 %   Basophils Absolute 0.0 0.0 - 0.1 K/uL  CBC with Differential/Platelet     Status: Abnormal   Collection Time: 09/14/15  5:15 AM  Result Value Ref Range   WBC 9.4 4.0 - 10.5 K/uL   RBC 3.60 (L) 3.87 - 5.11 MIL/uL   Hemoglobin 9.6 (L) 12.0 - 15.0 g/dL   HCT 31.529.6 (L) 17.636.0 - 16.046.0 %   MCV 82.2 78.0 - 100.0 fL   MCH 26.7 26.0 - 34.0 pg   MCHC 32.4 30.0 - 36.0 g/dL   RDW 73.713.8 10.611.5 - 26.915.5 %   Platelets 11 (LL) 150 - 400 K/uL    Comment: REPEATED TO VERIFY CRITICAL VALUE NOTED.  VALUE IS CONSISTENT WITH PREVIOUSLY REPORTED AND CALLED VALUE.    Neutrophils Relative % 74 %   Neutro Abs 7.0 1.7 - 7.7 K/uL  Lymphocytes Relative 11 %   Lymphs Abs 1.0 0.7 - 4.0 K/uL   Monocytes Relative 8 %   Monocytes Absolute 0.8 0.1 - 1.0 K/uL   Eosinophils Relative 7 %   Eosinophils Absolute 0.7 0.0 - 0.7 K/uL   Basophils Relative 0 %   Basophils Absolute 0.0 0.0 - 0.1 K/uL    Imaging / Studies: No results found.  Medications / Allergies:  Scheduled Meds: . sodium chloride   Intravenous Once  . aminocaproic acid  1 g Oral Q4H  . antiseptic oral rinse  7 mL Mouth Rinse BID  . norethindrone  5 mg Oral Daily  . polyethylene glycol  17 g Oral Daily  . senna  2 tablet Oral Daily   Continuous Infusions:  PRN Meds:.acetaminophen, oxyCODONE, phenylephrine, sodium chloride flush, traMADol, traZODone  Antibiotics: Anti-infectives    None        Assessment/Plan ITP-platelet count 11k this AM which is improved.  Appreciate excellent management by the medicine team. Surgery on Thursday.  Plan to type and cross tomorrow for 4units of PRBCs, 2 units of platelets on hold and 2 ready to give at start of surgery on Thursday. That is if her platelet count is "stable" or increased.  We  plan to discuss risks further in detail with the patient tomorrow at 0830 with Graciella. FEN-regular diet.  ID-no issues VTE prophylaxis-none due to above Dispo-surgery Thursday   Ashok NorrisEmina Jillane Po, [facility] Pager [phone](7A-4:30P) For consults and floor pages call [phone](7A-4:30P)  09/14/2015 9:55 AM

## 2015-09-15 LAB — BASIC METABOLIC PANEL
ANION GAP: 10 (ref 5–15)
BUN: 5 mg/dL — ABNORMAL LOW (ref 6–20)
CHLORIDE: 102 mmol/L (ref 101–111)
CO2: 23 mmol/L (ref 22–32)
Calcium: 8.2 mg/dL — ABNORMAL LOW (ref 8.9–10.3)
Creatinine, Ser: 0.76 mg/dL (ref 0.44–1.00)
GFR calc non Af Amer: >60 mL/min (ref >60)
Glucose, Bld: 138 mg/dL — ABNORMAL HIGH (ref 65–99)
Potassium: 3.3 mmol/L — ABNORMAL LOW (ref 3.5–5.1)
SODIUM: 135 mmol/L (ref 135–145)

## 2015-09-15 LAB — CBC WITH DIFFERENTIAL/PLATELET
BASOS ABS: 0 10*3/uL (ref 0.0–0.1)
BASOS PCT: 0 %
EOS ABS: 0.6 10*3/uL (ref 0.0–0.7)
Eosinophils Relative: 6 %
HEMATOCRIT: 25.9 % — AB (ref 36.0–46.0)
HEMOGLOBIN: 8.4 g/dL — AB (ref 12.0–15.0)
Lymphocytes Relative: 12 %
Lymphs Abs: 1.1 10*3/uL (ref 0.7–4.0)
MCH: 26.6 pg (ref 26.0–34.0)
MCHC: 32.4 g/dL (ref 30.0–36.0)
MCV: 82 fL (ref 78.0–100.0)
Monocytes Absolute: 0.8 10*3/uL (ref 0.1–1.0)
Monocytes Relative: 8 %
NEUTROS ABS: 7.3 10*3/uL (ref 1.7–7.7)
NEUTROS PCT: 74 %
Platelets: 6 10*3/uL — CL (ref 150–400)
RBC: 3.16 MIL/uL — AB (ref 3.87–5.11)
RDW: 13.8 % (ref 11.5–15.5)
WBC: 9.9 10*3/uL (ref 4.0–10.5)

## 2015-09-15 LAB — PREPARE RBC (CROSSMATCH)

## 2015-09-15 MED ORDER — POTASSIUM CHLORIDE CRYS ER 20 MEQ PO TBCR
20.0000 meq | EXTENDED_RELEASE_TABLET | Freq: Two times a day (BID) | ORAL | Status: AC
  Administered 2015-09-15 (×2): 20 meq via ORAL
  Filled 2015-09-15 (×2): qty 1

## 2015-09-15 MED ORDER — DEXTROSE-NACL 5-0.9 % IV SOLN
INTRAVENOUS | Status: AC
  Administered 2015-09-16: 01:00:00 via INTRAVENOUS

## 2015-09-15 MED ORDER — CEFAZOLIN SODIUM-DEXTROSE 2-4 GM/100ML-% IV SOLN
2.0000 g | INTRAVENOUS | Status: AC
  Administered 2015-09-16: 2 g via INTRAVENOUS
  Filled 2015-09-15: qty 100

## 2015-09-15 MED ORDER — ROMIPLOSTIM 250 MCG ~~LOC~~ SOLR
4.0000 ug/kg | Freq: Once | SUBCUTANEOUS | Status: AC
  Administered 2015-09-15: 425 ug via SUBCUTANEOUS
  Filled 2015-09-15: qty 0.85

## 2015-09-15 MED ORDER — DEXTROSE-NACL 5-0.45 % IV SOLN: INTRAVENOUS | Status: DC

## 2015-09-15 NOTE — Progress Notes (Signed)
Patient ID: [name], female   DOB: 02/13/1976, 40 y.o.   MRN: [id number]     [address] [place], [place] [address]    Phone: [phone] FAX: [phone]     Subjective: hgb down 8.4/25.9k.  plt down to [place].   Objective:  Vital signs:  Filed Vitals:   09/14/15 2008 09/14/15 2011 09/14/15 2237 09/15/15 0306  BP:  _0  Pulse:  97  91  Temp: 98.7 F (37.1 C)  98.5 F (36.9 C) 98.6 F (37 C)  TempSrc: Axillary  Axillary Axillary  Resp:  21  23  Height:      Weight:      SpO2:  97% 100% 97%    Last BM Date: 09/14/15  Intake/Output   Yesterday:  06/27 0701 - 06/28 0700 In: 320 [P.O.:120; I.V.:200] Out: -  I/O last 3 completed shifts: In: 320 [P.O.:120; I.V.:200] Out: -     Physical Exam: General: Pt awake/alert/oriented x4 in no acute distress Abdomen: Soft. Nondistended. Non tender. No evidence of peritonitis. No incarcerated hernias.    Problem List:   Principal Problem:   Acute ITP ([place]) Active Problems:   Chronic hepatitis B (HCC)   Nontraumatic subarachnoid hemorrhage (HCC)   Severe thrombocytopenia (HCC)    Results:   Labs: Results for orders placed or performed during the hospital encounter of 08/28/15 (from the past 48 hour(s))  CBC with Differential/Platelet     Status: Abnormal   Collection Time: 09/14/15  5:15 AM  Result Value Ref Range   WBC 9.4 4.0 - 10.5 K/uL   RBC 3.60 (L) 3.87 - 5.11 MIL/uL   Hemoglobin 9.6 (L) 12.0 - 15.0 g/dL   HCT 29.6 (L) 36.0 - 46.0 %   MCV 82.2 78.0 - 100.0 fL   MCH 26.7 26.0 - 34.0 pg   MCHC 32.4 30.0 - 36.0 g/dL   RDW 13.8 11.5 - 15.5 %   Platelets 11 (LL) 150 - 400 K/uL    Comment: REPEATED TO VERIFY CRITICAL VALUE NOTED.  VALUE IS CONSISTENT WITH PREVIOUSLY REPORTED AND CALLED VALUE.    Neutrophils Relative % 74 %   Neutro Abs 7.0 1.7 - 7.7 K/uL   Lymphocytes Relative 11 %   Lymphs Abs 1.0 0.7 - 4.0  K/uL   Monocytes Relative 8 %   Monocytes Absolute 0.8 0.1 - 1.0 K/uL   Eosinophils Relative 7 %   Eosinophils Absolute 0.7 0.0 - 0.7 K/uL   Basophils Relative 0 %   Basophils Absolute 0.0 0.0 - 0.1 K/uL  CBC with Differential/Platelet     Status: Abnormal   Collection Time: 09/15/15  5:30 AM  Result Value Ref Range   WBC 9.9 4.0 - 10.5 K/uL   RBC 3.16 (L) 3.87 - 5.11 MIL/uL   Hemoglobin 8.4 (L) 12.0 - 15.0 g/dL   HCT 25.9 (L) 36.0 - 46.0 %   MCV 82.0 78.0 - 100.0 fL   MCH 26.6 26.0 - 34.0 pg   MCHC 32.4 30.0 - 36.0 g/dL   RDW 13.8 11.5 - 15.5 %   Platelets 6 (LL) 150 - 400 K/uL    Comment: SPECIMEN CHECKED FOR CLOTS REPEATED TO VERIFY CRITICAL VALUE NOTED.  VALUE IS CONSISTENT WITH PREVIOUSLY REPORTED AND CALLED VALUE.    Neutrophils Relative % 74 %   Neutro Abs 7.3 1.7 - 7.7 K/uL   Lymphocytes Relative 12 %   Lymphs  Abs 1.1 0.7 - 4.0 K/uL   Monocytes Relative 8 %   Monocytes Absolute 0.8 0.1 - 1.0 K/uL   Eosinophils Relative 6 %   Eosinophils Absolute 0.6 0.0 - 0.7 K/uL   Basophils Relative 0 %   Basophils Absolute 0.0 0.0 - 0.1 K/uL  Basic metabolic panel     Status: Abnormal   Collection Time: 09/15/15  5:30 AM  Result Value Ref Range   Sodium 135 135 - 145 mmol/L   Potassium 3.3 (L) 3.5 - 5.1 mmol/L   Chloride 102 101 - 111 mmol/L   CO2 23 22 - 32 mmol/L   Glucose, Bld 138 (H) 65 - 99 mg/dL   BUN 5 (L) 6 - 20 mg/dL   Creatinine, Ser 0.76 0.44 - 1.00 mg/dL   Calcium 8.2 (L) 8.9 - 10.3 mg/dL   GFR calc non Af Amer >60 >60 mL/min   GFR calc Af Amer >60 >60 mL/min    Comment: (NOTE) The eGFR has been calculated using the CKD EPI equation. This calculation has not been validated in all clinical situations. eGFR's persistently <60 mL/min signify possible Chronic Kidney Disease.    Anion gap 10 5 - 15    Imaging / Studies: No results found.  Medications / Allergies:  Scheduled Meds: . sodium chloride   Intravenous Once  . aminocaproic acid  1 g Oral Q4H  .  antiseptic oral rinse  7 mL Mouth Rinse BID  . norethindrone  5 mg Oral Daily  . polyethylene glycol  17 g Oral Daily  . senna  2 tablet Oral Daily   Continuous Infusions:  PRN Meds:.acetaminophen, oxyCODONE, phenylephrine, sodium chloride flush, traMADol, traZODone  Antibiotics: Anti-infectives    None       Assessment/Plan ITP-platelet count down to 6K this AM from 11. Appreciate excellent management by the medicine team. Surgery on Thursday.Type and cross for 4units of PRBCs, 2 units of platelets on hold and 2 ready to give at start of surgery on Thursday. That is if her platelet count is "stable" or increased. I have discussed surgical risks in detail with the patient and her daughter including but not limited to bleeding intra and post operatively which she is at extremely high risk of, death, infection, anesthesia risks.  The patient verbalizes understanding and wishes to proceed.  Spoke with blood bank regarding blood products.   FEN-regular diet, NPO after MN ID-no issues VTE prophylaxis-none due to above Dispo-OR in AM     Erby Pian, [facility] Surgery Pager [phone](7A-4:30P) For consults and floor pages call [phone](7A-4:30P)  09/15/2015 8:47 AM

## 2015-09-15 NOTE — Progress Notes (Signed)
Patient reported that her nose has started bleeding again. Patient has gauze at the bedside that she was using for her bleeding gums and was able to use some for her nose. Encouraged patient to leave the gauze and not change frequently to allow time for clotting. Notified Dr. Dimple Caseyice of new onset bleeding of the nose. No new orders given. Will continue to monitor.

## 2015-09-15 NOTE — Progress Notes (Signed)
Subjective No acute events overnight. She was unable to accomplish much with therapy due to generalized weakness and headache. Continues to have ongoing mucosal bleeding and decreased PO intake since her mouth hurts and is frequently packing with gauze.  Objective: Vital signs in last 24 hours: Filed Vitals:   09/14/15 2011 09/14/15 2237 09/15/15 0306 09/15/15 0700  BP: 95/52 95/58 94/59  98/58  Pulse: 97  91 82  Temp:  98.5 F (36.9 C) 98.6 F (37 C) 99.2 F (37.3 C)  TempSrc:  Axillary Axillary Axillary  Resp: 21  23 14   Height:      Weight:      SpO2: 97% 100% 97% 96%   Weight change:   Intake/Output Summary (Last 24 hours) at 09/15/15 1119 Last data filed at 09/14/15 2242  Gross per 24 hour  Intake    320 ml  Output      0 ml  Net    320 ml   General: NAD, obese HEENT: gingival and petechial bleeding on buccal mucosa as well as palette, very small right scleral hemorrhage Lungs: CTAB Cardiac: RRR, no murmurs, rubs, or gallops Abdomen: Soft, no guarding or rebound Skin: Petechiae present on face, upper chest, arms, legs Extremities: no pedal edema  CBC:  Recent Labs Lab 09/14/15 0515 09/15/15 0530  WBC 9.4 9.9  NEUTROABS 7.0 7.3  HGB 9.6* 8.4*  HCT 29.6* 25.9*  MCV 82.2 82.0  PLT 11* 6*     Medications: I have reviewed the patient's current medications. Scheduled Meds: . sodium chloride   Intravenous Once  . aminocaproic acid  1 g Oral Q4H  . antiseptic oral rinse  7 mL Mouth Rinse BID  . norethindrone  5 mg Oral Daily  . polyethylene glycol  17 g Oral Daily  . 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 S/p IVIG x 6/10-11 and 6/13 and decadron 6/10 -13. Initial had CT showing punctate hemorrhages, with repeat CT head wo contrast 6/21 negative for  bleeding. She has ongoing mucosal bleeds with a mild downward trend in Hgb. So far no significant clinical response to first or second line medical treatment. She continues to have decreasing Hgb with daily phelobotomy and ongoing slow bleeding. Her daily risk of major bleeding is substantial. Platelet count of 6 today, not a sustained trend from 11 yesterday. Discussed the case with surgery, hematology, surgery services and agree her lowest risk plan of action would be for splenectomy tomorrow because her risk of waiting indefinitely longer for ideally a good response to medical management is too high. Plan to move next dose of n-plate to day per heme recs. - Continue Rituxan 6/16>>> - Dose 4 n-plate 4mg  today - continue aygestin 5 mg daily  - CBC daily - Coordinating plan with surgery service, currently anticipate splenectomy on 6/29 - Surgery and hematology services recommendations greatly appreciated  Dizziness- Persistent postural dizziness. She also reports some generalized weakness that may be from deconditioning, anemia, hypovolemia with poor PO intake, mild hypokalemia. No focal deficits. Mild hypotension yesterday to 80s/60s. Therapy interventions have remained limited due to her very high risk of major injury with any fall. - Continue fall precautions/up with assist - Maintenance fluids D5 1/2NS if PO intake remaining low  FULL CODE Diet: Soft diet, NPO at midnight VTE ppx: SCDs  Dispo: Disposition is deferred at this time,  awaiting improvement of current medical problems.  The patient does have a current PCP (No Pcp Per Patient) and [facility] after discharge.  LOS: 18 days   Fuller Planhristopher W Heinrich Fertig, MD 09/15/2015, 11:19 AM

## 2015-09-15 NOTE — Progress Notes (Signed)
Interpreter Wyvonnia DuskyGraciela Namihira for [employer] Surgeon team

## 2015-09-15 NOTE — Progress Notes (Signed)
Patient ID: [name], female   DOB: 02/23/1976, 40 y.o.   MRN: [id number] Hematology: Platelets down again today. Hemoglobin drifting down: 8.4 Persistent oro/gingival bleeding.Diffuse petecchial rash. No focal neuro deficit. Impression: Refractory ITP Reviewed options again w pt & daughter At this point splenectomy potentially lifesaving and benefits exceed risks of waiting for ancillary treatments to work with respect to her risk of major hemorrhage. I will move up N-Plate dose and give 4 microgram/kg dose today hoping to have something on board and working in event of delayed platelet response to splenectomy. Clinical trial data showed average dose for response was 3-4 mcg/kg. Plan to give platelet transfusion immediately prior to procedure in OR.    Cephas DarbyJames Granfortuna, MD, FACP  Hematology-Oncology/Internal Medicine [phone]

## 2015-09-16 ENCOUNTER — Encounter (HOSPITAL_COMMUNITY)
Admission: EM | Disposition: A | Discharge status: Other Acute Inpatient Hospital | Payer: Self-pay | Source: Home / Self Care | Attending: Student in an Organized Health Care Education/Training Program

## 2015-09-16 ENCOUNTER — Inpatient Hospital Stay (HOSPITAL_COMMUNITY): Payer: Medicaid Other | Admitting: Anesthesiology

## 2015-09-16 ENCOUNTER — Inpatient Hospital Stay (HOSPITAL_COMMUNITY): Payer: Medicaid Other

## 2015-09-16 ENCOUNTER — Encounter (HOSPITAL_COMMUNITY): Payer: Self-pay | Admitting: Certified Registered"

## 2015-09-16 HISTORY — PX: SPLENECTOMY, TOTAL: SHX788

## 2015-09-16 LAB — POCT I-STAT 7, (LYTES, BLD GAS, ICA,H+H)
ACID-BASE DEFICIT: 3 mmol/L — AB (ref 0.0–2.0)
Bicarbonate: 21.7 mEq/L (ref 20.0–24.0)
Calcium, Ion: 1.08 mmol/L — ABNORMAL LOW (ref 1.13–1.30)
HCT: 23 % — ABNORMAL LOW (ref 36.0–46.0)
HEMOGLOBIN: 7.8 g/dL — AB (ref 12.0–15.0)
O2 Saturation: 100 %
PCO2 ART: 37.7 mmHg (ref 35.0–45.0)
PO2 ART: 246 mmHg — AB (ref 80.0–100.0)
Potassium: 3.6 mmol/L (ref 3.5–5.1)
Sodium: 142 mmol/L (ref 135–145)
TCO2: 23 mmol/L (ref 0–100)
pH, Arterial: 7.365 (ref 7.350–7.450)

## 2015-09-16 LAB — CBC WITH DIFFERENTIAL/PLATELET
BASOS ABS: 0.1 10*3/uL (ref 0.0–0.1)
BASOS PCT: 1 %
EOS ABS: 0.5 10*3/uL (ref 0.0–0.7)
Eosinophils Relative: 6 %
HCT: 25.3 % — ABNORMAL LOW (ref 36.0–46.0)
HEMOGLOBIN: 8.1 g/dL — AB (ref 12.0–15.0)
Lymphocytes Relative: 14 %
Lymphs Abs: 1.3 10*3/uL (ref 0.7–4.0)
MCH: 26.9 pg (ref 26.0–34.0)
MCHC: 32 g/dL (ref 30.0–36.0)
MCV: 84.1 fL (ref 78.0–100.0)
MONOS PCT: 9 %
Monocytes Absolute: 0.8 10*3/uL (ref 0.1–1.0)
NEUTROS PCT: 71 %
Neutro Abs: 6.3 10*3/uL (ref 1.7–7.7)
Platelets: <5 10*3/uL — CL (ref 150–400)
RBC: 3.01 MIL/uL — AB (ref 3.87–5.11)
RDW: 14 % (ref 11.5–15.5)
WBC: 8.9 10*3/uL (ref 4.0–10.5)

## 2015-09-16 LAB — CBC
HEMATOCRIT: 30.3 % — AB (ref 36.0–46.0)
HEMOGLOBIN: 10.1 g/dL — AB (ref 12.0–15.0)
MCH: 28 pg (ref 26.0–34.0)
MCHC: 33.3 g/dL (ref 30.0–36.0)
MCV: 83.9 fL (ref 78.0–100.0)
Platelets: <5 10*3/uL — CL (ref 150–400)
RBC: 3.61 MIL/uL — AB (ref 3.87–5.11)
RDW: 13.7 % (ref 11.5–15.5)
WBC: 9.6 10*3/uL (ref 4.0–10.5)

## 2015-09-16 LAB — NGI HBV SUPERQUANT

## 2015-09-16 SURGERY — SPLENECTOMY
Anesthesia: General

## 2015-09-16 MED ORDER — SUGAMMADEX SODIUM 500 MG/5ML IV SOLN
INTRAVENOUS | Status: DC | PRN
  Administered 2015-09-16: 500 mg via INTRAVENOUS

## 2015-09-16 MED ORDER — PROPOFOL 10 MG/ML IV BOLUS
INTRAVENOUS | Status: DC | PRN
  Administered 2015-09-16: 120 mg via INTRAVENOUS

## 2015-09-16 MED ORDER — NALOXONE HCL 0.4 MG/ML IJ SOLN: 0.4000 mg | INTRAMUSCULAR | Status: DC | PRN

## 2015-09-16 MED ORDER — HYDROMORPHONE 1 MG/ML IV SOLN
INTRAVENOUS | Status: DC
  Administered 2015-09-16: 16:00:00 via INTRAVENOUS
  Administered 2015-09-16: 3.6 mg via INTRAVENOUS
  Administered 2015-09-17: 3 mg via INTRAVENOUS
  Administered 2015-09-17: 1.8 mg via INTRAVENOUS
  Administered 2015-09-17: 2.4 mg via INTRAVENOUS
  Administered 2015-09-17 (×2): 3 mg via INTRAVENOUS
  Administered 2015-09-17: 4.2 mg via INTRAVENOUS
  Administered 2015-09-17: 2.4 mg via INTRAVENOUS
  Administered 2015-09-18: 3.6 mg via INTRAVENOUS
  Administered 2015-09-18 (×2): 4.5 mg via INTRAVENOUS
  Administered 2015-09-18: 2.1 mg via INTRAVENOUS
  Administered 2015-09-19: 3.6 mg via INTRAVENOUS
  Administered 2015-09-19 (×2): 2.7 mg via INTRAVENOUS
  Administered 2015-09-19: 2.4 mg via INTRAVENOUS
  Administered 2015-09-19: 04:00:00 via INTRAVENOUS
  Administered 2015-09-19: 3.6 mg via INTRAVENOUS
  Administered 2015-09-19: 2.3 mg via INTRAVENOUS
  Administered 2015-09-20: 1.2 mg via INTRAVENOUS
  Administered 2015-09-20 (×4): 0.9 mg via INTRAVENOUS
  Administered 2015-09-20: 1.5 mg via INTRAVENOUS
  Filled 2015-09-16 (×3): qty 25

## 2015-09-16 MED ORDER — FENTANYL CITRATE (PF) 250 MCG/5ML IJ SOLN
INTRAMUSCULAR | Status: DC | PRN
  Administered 2015-09-16 (×2): 50 ug via INTRAVENOUS
  Administered 2015-09-16: 150 ug via INTRAVENOUS
  Administered 2015-09-16: 100 ug via INTRAVENOUS
  Administered 2015-09-16: 50 ug via INTRAVENOUS

## 2015-09-16 MED ORDER — FENTANYL CITRATE (PF) 250 MCG/5ML IJ SOLN
INTRAMUSCULAR | Status: AC
  Filled 2015-09-16: qty 5

## 2015-09-16 MED ORDER — ROCURONIUM BROMIDE 50 MG/5ML IV SOLN
INTRAVENOUS | Status: AC
  Filled 2015-09-16: qty 1

## 2015-09-16 MED ORDER — SODIUM CHLORIDE 0.9% FLUSH: 9.0000 mL | INTRAVENOUS | Status: DC | PRN

## 2015-09-16 MED ORDER — HYDROMORPHONE HCL 1 MG/ML IJ SOLN
INTRAMUSCULAR | Status: AC
Start: 2015-09-16 — End: 2015-09-17
  Filled 2015-09-16: qty 1

## 2015-09-16 MED ORDER — ONDANSETRON HCL 4 MG/2ML IJ SOLN
INTRAMUSCULAR | Status: DC | PRN
  Administered 2015-09-16: 4 mg via INTRAVENOUS

## 2015-09-16 MED ORDER — DIPHENHYDRAMINE HCL 50 MG/ML IJ SOLN: 12.5000 mg | Freq: Four times a day (QID) | INTRAMUSCULAR | Status: DC | PRN

## 2015-09-16 MED ORDER — SODIUM CHLORIDE 0.9 % IV SOLN
INTRAVENOUS | Status: DC | PRN
  Administered 2015-09-16: 10:00:00 via INTRAVENOUS

## 2015-09-16 MED ORDER — DIPHENHYDRAMINE HCL 12.5 MG/5ML PO ELIX: 12.5000 mg | ORAL_SOLUTION | Freq: Four times a day (QID) | ORAL | Status: DC | PRN

## 2015-09-16 MED ORDER — LACTATED RINGERS IV SOLN
INTRAVENOUS | Status: DC | PRN
  Administered 2015-09-16: 10:00:00 via INTRAVENOUS

## 2015-09-16 MED ORDER — NEOSTIGMINE METHYLSULFATE 5 MG/5ML IV SOSY
PREFILLED_SYRINGE | INTRAVENOUS | Status: AC
  Filled 2015-09-16: qty 5

## 2015-09-16 MED ORDER — ONDANSETRON HCL 4 MG/2ML IJ SOLN
INTRAMUSCULAR | Status: AC
  Filled 2015-09-16: qty 2

## 2015-09-16 MED ORDER — ROCURONIUM BROMIDE 100 MG/10ML IV SOLN
INTRAVENOUS | Status: DC | PRN
  Administered 2015-09-16: 30 mg via INTRAVENOUS
  Administered 2015-09-16: 10 mg via INTRAVENOUS
  Administered 2015-09-16: 30 mg via INTRAVENOUS
  Administered 2015-09-16: 10 mg via INTRAVENOUS

## 2015-09-16 MED ORDER — LIDOCAINE 2% (20 MG/ML) 5 ML SYRINGE
INTRAMUSCULAR | Status: AC
  Filled 2015-09-16: qty 5

## 2015-09-16 MED ORDER — HYDROMORPHONE HCL 1 MG/ML IJ SOLN
0.2500 mg | INTRAMUSCULAR | Status: DC | PRN
  Administered 2015-09-16 (×4): 0.5 mg via INTRAVENOUS

## 2015-09-16 MED ORDER — MIDAZOLAM HCL 2 MG/2ML IJ SOLN
INTRAMUSCULAR | Status: AC
  Filled 2015-09-16: qty 2

## 2015-09-16 MED ORDER — SUCCINYLCHOLINE 20MG/ML (10ML) SYRINGE FOR MEDFUSION PUMP - OPTIME
INTRAMUSCULAR | Status: DC | PRN
  Administered 2015-09-16: 140 mg via INTRAVENOUS

## 2015-09-16 MED ORDER — ONDANSETRON HCL 4 MG/2ML IJ SOLN
4.0000 mg | Freq: Four times a day (QID) | INTRAMUSCULAR | Status: DC | PRN
Start: 2015-09-16 — End: 2015-09-20
  Administered 2015-09-19: 4 mg via INTRAVENOUS
  Filled 2015-09-16: qty 2

## 2015-09-16 MED ORDER — HEMOSTATIC AGENTS (NO CHARGE) OPTIME
TOPICAL | Status: DC | PRN
  Administered 2015-09-16: 1 via TOPICAL

## 2015-09-16 MED ORDER — HYDROMORPHONE HCL 1 MG/ML IJ SOLN
INTRAMUSCULAR | Status: AC
  Filled 2015-09-16: qty 1

## 2015-09-16 MED ORDER — PHENYLEPHRINE 40 MCG/ML (10ML) SYRINGE FOR IV PUSH (FOR BLOOD PRESSURE SUPPORT)
PREFILLED_SYRINGE | INTRAVENOUS | Status: AC
  Filled 2015-09-16: qty 10

## 2015-09-16 MED ORDER — POVIDONE-IODINE 10 % EX OINT
TOPICAL_OINTMENT | CUTANEOUS | Status: AC
  Filled 2015-09-16: qty 28.35

## 2015-09-16 MED ORDER — PROPOFOL 10 MG/ML IV BOLUS
INTRAVENOUS | Status: AC
  Filled 2015-09-16: qty 20

## 2015-09-16 MED ORDER — LACTATED RINGERS IV SOLN
INTRAVENOUS | Status: DC
  Administered 2015-09-16 – 2015-09-19 (×4): via INTRAVENOUS

## 2015-09-16 MED ORDER — MIDAZOLAM HCL 5 MG/5ML IJ SOLN
INTRAMUSCULAR | Status: DC | PRN
  Administered 2015-09-16: 2 mg via INTRAVENOUS

## 2015-09-16 MED ORDER — LIDOCAINE HCL (CARDIAC) 20 MG/ML IV SOLN
INTRAVENOUS | Status: DC | PRN
  Administered 2015-09-16: 100 mg via INTRAVENOUS

## 2015-09-16 MED ORDER — MORPHINE SULFATE (PF) 2 MG/ML IV SOLN
1.0000 mg | INTRAVENOUS | Status: DC | PRN
  Administered 2015-09-16: 2 mg via INTRAVENOUS
  Administered 2015-09-16: 1 mg via INTRAVENOUS
  Administered 2015-09-19 – 2015-09-20 (×2): 2 mg via INTRAVENOUS
  Filled 2015-09-16 (×4): qty 1

## 2015-09-16 MED ORDER — SUGAMMADEX SODIUM 500 MG/5ML IV SOLN
INTRAVENOUS | Status: AC
  Filled 2015-09-16: qty 5

## 2015-09-16 MED ORDER — GLYCOPYRROLATE 0.2 MG/ML IV SOSY
PREFILLED_SYRINGE | INTRAVENOUS | Status: AC
  Filled 2015-09-16: qty 3

## 2015-09-16 SURGICAL SUPPLY — 49 items
APPLIER CLIP 11 MED OPEN (CLIP) ×3
APR CLP MED 11 20 MLT OPN (CLIP) ×1
BLADE SURG ROTATE 9660 (MISCELLANEOUS) IMPLANT
CANISTER SUCTION 2500CC (MISCELLANEOUS) ×3 IMPLANT
CHLORAPREP W/TINT 26ML (MISCELLANEOUS) ×3 IMPLANT
CLIP APPLIE 11 MED OPEN (CLIP) ×1 IMPLANT
CLIP TI LARGE 6 (CLIP) ×6 IMPLANT
CONT SPEC 4OZ CLIKSEAL STRL BL (MISCELLANEOUS) IMPLANT
COVER SURGICAL LIGHT HANDLE (MISCELLANEOUS) ×3 IMPLANT
DRAPE LAPAROSCOPIC ABDOMINAL (DRAPES) ×3 IMPLANT
DRAPE UTILITY XL STRL (DRAPES) ×6 IMPLANT
DRSG OPSITE POSTOP 4X10 (GAUZE/BANDAGES/DRESSINGS) ×2 IMPLANT
ELECT BLADE 6.5 EXT (BLADE) ×2 IMPLANT
ELECT REM PT RETURN 9FT ADLT (ELECTROSURGICAL) ×3
ELECTRODE REM PT RTRN 9FT ADLT (ELECTROSURGICAL) ×1 IMPLANT
GAUZE SPONGE 4X4 12PLY STRL (GAUZE/BANDAGES/DRESSINGS) ×3 IMPLANT
GLOVE BIO SURGEON STRL SZ7.5 (GLOVE) ×6 IMPLANT
GLOVE BIOGEL PI IND STRL 6 (GLOVE) IMPLANT
GLOVE BIOGEL PI IND STRL 6.5 (GLOVE) IMPLANT
GLOVE BIOGEL PI IND STRL 7.0 (GLOVE) IMPLANT
GLOVE BIOGEL PI INDICATOR 6 (GLOVE) ×2
GLOVE BIOGEL PI INDICATOR 6.5 (GLOVE) ×2
GLOVE BIOGEL PI INDICATOR 7.0 (GLOVE) ×2
GLOVE ECLIPSE 8.0 STRL XLNG CF (GLOVE) ×2 IMPLANT
GOWN STRL REUS W/ TWL LRG LVL3 (GOWN DISPOSABLE) ×2 IMPLANT
GOWN STRL REUS W/TWL LRG LVL3 (GOWN DISPOSABLE) ×9
HEMOSTAT SNOW SURGICEL 2X4 (HEMOSTASIS) ×2 IMPLANT
KIT BASIN OR (CUSTOM PROCEDURE TRAY) ×3 IMPLANT
KIT ROOM TURNOVER OR (KITS) ×3 IMPLANT
LIGASURE IMPACT 36 18CM CVD LR (INSTRUMENTS) ×2 IMPLANT
NDL BIOPSY 14X6 SOFT TISS (NEEDLE) IMPLANT
NEEDLE BIOPSY 14X6 SOFT TISS (NEEDLE) IMPLANT
NS IRRIG 1000ML POUR BTL (IV SOLUTION) ×3 IMPLANT
PACK GENERAL/GYN (CUSTOM PROCEDURE TRAY) ×3 IMPLANT
PAD ARMBOARD 7.5X6 YLW CONV (MISCELLANEOUS) ×6 IMPLANT
SPONGE INTESTINAL PEANUT (DISPOSABLE) IMPLANT
SPONGE LAP 18X18 X RAY DECT (DISPOSABLE) ×6 IMPLANT
STAPLER VISISTAT 35W (STAPLE) ×3 IMPLANT
SUT CHROMIC 2 0 CT 1 (SUTURE) IMPLANT
SUT CHROMIC 2 0 SH (SUTURE) IMPLANT
SUT ETHILON 3 0 FSLX (SUTURE) IMPLANT
SUT PDS AB 1 TP1 96 (SUTURE) ×10 IMPLANT
SUT SILK 2 0 SH (SUTURE) ×8 IMPLANT
SUT SILK 2 0 SH CR/8 (SUTURE) ×2 IMPLANT
SUT SILK 2 0 TIES 10X30 (SUTURE) ×3 IMPLANT
SUT SILK 3 0 SH CR/8 (SUTURE) ×3 IMPLANT
TOWEL OR 17X24 6PK STRL BLUE (TOWEL DISPOSABLE) ×3 IMPLANT
TOWEL OR 17X26 10 PK STRL BLUE (TOWEL DISPOSABLE) ×3 IMPLANT
WATER STERILE IRR 1000ML POUR (IV SOLUTION) ×3 IMPLANT

## 2015-09-16 NOTE — Anesthesia Postprocedure Evaluation (Signed)
Anesthesia Post Note  Patient: [name]  Procedure(s) Performed: Procedure(s) (LRB): OPEN SPLENECTOMY (N/A)  Patient location during evaluation: PACU Anesthesia Type: General Level of consciousness: awake and alert Pain management: pain level controlled Vital Signs Assessment: post-procedure vital signs reviewed and stable Respiratory status: spontaneous breathing, nonlabored ventilation, respiratory function stable and patient connected to nasal cannula oxygen Cardiovascular status: blood pressure returned to baseline and stable Postop Assessment: no signs of nausea or vomiting Anesthetic complications: no    Last Vitals:  Filed Vitals:   09/16/15 1500 09/16/15 1622  BP: 103/68   Pulse: 87   Temp:    Resp: 13 16    Last Pain:  Filed Vitals:   09/16/15 1624  PainSc: 10-Worst pain ever                 Malosi Hemstreet DAVID

## 2015-09-16 NOTE — Anesthesia Procedure Notes (Addendum)
Procedure Name: Intubation Date/Time: 09/16/2015 10:01 AM Performed by: Lucinda DellECARLO, VALERIE M Pre-anesthesia Checklist: Patient identified, Emergency Drugs available, Suction available and Patient being monitored Patient Re-evaluated:Patient Re-evaluated prior to inductionOxygen Delivery Method: Circle System Utilized Preoxygenation: Pre-oxygenation with 100% oxygen Intubation Type: IV induction Laryngoscope Size: Mac and 3 Grade View: Grade I Tube type: Oral Tube size: 7.5 mm Number of attempts: 1 Airway Equipment and Method: Stylet Placement Confirmation: ETT inserted through vocal cords under direct vision,  positive ETCO2 and breath sounds checked- equal and bilateral Secured at: 22 cm Tube secured with: Tape Dental Injury: Teeth and Oropharynx as per pre-operative assessment    Central Venous Catheter Insertion Performed by: anesthesiologist 09/16/2015 10:05 AM Patient location: OR. Preanesthetic checklist: patient identified, IV checked, site marked, risks and benefits discussed, surgical consent, monitors and equipment checked, pre-op evaluation, timeout performed and anesthesia consent Position: Trendelenburg Landmarks identified Catheter size: 7.5 Fr Central line was placed.Double lumen Procedure performed using ultrasound guided technique. Attempts: 1 Following insertion, line sutured, dressing applied and Biopatch. Post procedure assessment: blood return through all ports and free fluid flow. Patient tolerated the procedure well with no immediate complications.

## 2015-09-16 NOTE — Anesthesia Preprocedure Evaluation (Addendum)
Anesthesia Evaluation  Patient identified by MRN, date of birth, ID band Patient awake    Reviewed: Allergy & Precautions, H&P , NPO status , Patient's Chart, lab work & pertinent test results  Airway Mallampati: I  TM Distance: >3 FB Neck ROM: Full    Dental no notable dental hx.    Pulmonary neg pulmonary ROS,    Pulmonary exam normal        Cardiovascular negative cardio ROS Normal cardiovascular exam     Neuro/Psych Depression negative neurological ROS  negative psych ROS   GI/Hepatic negative GI ROS, Neg liver ROS,   Endo/Other  negative endocrine ROSMorbid obesity  Renal/GU negative Renal ROS  negative genitourinary   Musculoskeletal   Abdominal   Peds  Hematology negative hematology ROS (+) anemia , ITP   Anesthesia Other Findings   Reproductive/Obstetrics negative OB ROS                            Anesthesia Physical Anesthesia Plan  ASA: III  Anesthesia Plan: General   Post-op Pain Management:    Induction: Intravenous  Airway Management Planned: Oral ETT  Additional Equipment: Arterial line  Intra-op Plan:   Post-operative Plan: Extubation in OR  Informed Consent: I have reviewed the patients History and Physical, chart, labs and discussed the procedure including the risks, benefits and alternatives for the proposed anesthesia with the patient or authorized representative who has indicated his/her understanding and acceptance.   Dental advisory given  Plan Discussed with: CRNA and Surgeon  Anesthesia Plan Comments:        Anesthesia Quick Evaluation

## 2015-09-16 NOTE — Progress Notes (Signed)
Patient ID: [name], female   DOB: 02/14/1976, 40 y.o.   MRN: [id number] Hematology: 3rd dose of N-Plate administered 6/28 @ 4 mcg/kg Platelet count remains low. Hb down to 8.1 Continued oral bleeding/intermittent epistaxis Evaluated by resident at 12 AM for transient dizziness/numbness - likely related to sedation. 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 Headache Most like due to dose of N-Plate given yesterday 3.Resolved hepatitis B infection HbSag negative, surface antibody positive  Plan on triple vaccines in 2 weeks post-op Pneumovax, meningitis,H Influenzae.

## 2015-09-16 NOTE — Interval H&P Note (Signed)
History and Physical Interval Note:  09/16/2015 8:42 AM  [name]  has presented today for surgery, with the diagnosis of ITP  The various methods of treatment have been discussed with the patient and family. After consideration of risks, benefits and other options for treatment, the patient has consented to  Procedure(s): OPEN SPLENECTOMY (N/A) as a surgical intervention .  The patient's history has been reviewed, patient examined, no change in status, stable for surgery.  I have reviewed the patient's chart and labs.  Questions were answered to the patient's satisfaction.     TOTH III,Lucky Trotta S

## 2015-09-16 NOTE — Op Note (Signed)
08/28/2015 - 09/16/2015  12:04 PM  PATIENT:  [name]  40 y.o. female  PRE-OPERATIVE DIAGNOSIS:  ITP  POST-OPERATIVE DIAGNOSIS:  ITP  PROCEDURE:  Procedure(s): OPEN SPLENECTOMY (N/A)  SURGEON:  Surgeon(s) and Role:    * Griselda MinerPaul Toth III, MD - Primary    * Avel Peaceodd Rosenbower, MD - Assisting  PHYSICIAN ASSISTANT:   ASSISTANTS: Dr. Abbey Chattersosenbower   ANESTHESIA:   general  EBL:  Total I/O In: 3664.1 [I.V.:1887.1; Blood:1777] Out: 675 [Urine:175; Blood:500]  BLOOD ADMINISTERED:4 units CC PRBC and 4 packs PLTS  DRAINS: none   LOCAL MEDICATIONS USED:  NONE  SPECIMEN:  Source of Specimen:  spleen  DISPOSITION OF SPECIMEN:  PATHOLOGY  COUNTS:  YES  TOURNIQUET:  * No tourniquets in log *  DICTATION: .Dragon Dictation   After informed consent was obtained the patient was brought to the operating room and placed in the supine position on the operating room table. After adequate induction of general anesthesia a roll was placed under the patient'sleft side. The abdomen was then prepped with ChloraPrep, allowed to dry, and draped in the usual sterile manner. An appropriate timeout was performed. Platelets and blood were transfused at the start of the case. A left subcostal incision was made with a 10 blade knife. The incision was carried through the skin and subcutaneous tissue sharply with the electrocautery until the fascia of the abdominal wall was encountered. The fascia and muscle were divided sharply with the electrocautery. The peritoneum was then grasped between hemostats and opened sharply with Metzenbaum scissors. This allowed access to the abdominal cavity. The rest of the incision was opened under direct vision. A Bookwalter retractor was deployed.The colon was retracted inferiorly. Dissection was begun at the inferior pole of the spleen. The posterior attachments were taken down sharply with the electrocautery. The hilum was then dissected bluntly with a right angle  clamp. The vessels were controlled as we reach them withclips. The main hilar vessels were controlled with Kelly clamps. These vessels were divided and then tied with 2-0 silk suture ligatures. A short gastric vessel was also controlled with a Kelly clamp, divided, and tied with a 2-0 silk tie. Once this was accomplished the spleen was able to be removed. The area was irrigated with copious amounts of saline. The hilar area was examined again and appeared to be hemostatic.The tail of The pancreas was also identified and there did not appear to be any injury in this area. A piece of Surgicel snow was placed over the hilar area. The retractors were then removed. All instrument needle and sponge counts were correct. The fascia of the abdominal wall was then closed with 2 layers of #1 double-stranded loop PDS sutures. The wound was irrigated with saline. The skin was closed with staples. Sterile dressings were applied. The patient tolerated the procedure well. The patient was thawakened and taken to recovery in stable condition.  PLAN OF CARE: Admit to inpatient   PATIENT DISPOSITION:  PACU - hemodynamically stable.   Delay start of Pharmacological VTE agent (>24hrs) due to surgical blood loss or risk of bleeding: yes

## 2015-09-16 NOTE — Progress Notes (Addendum)
Patient ID: [name], female   DOB: 04/03/1975, 40 y.o.   MRN: [id number] Hematology: Post op check She is awake, extubated, LUQ wound dry She was given 4 units of blood & 4 units of platelets at the initiation of the procedure.  EBL 500 ml. CBC pending. Greatly appreciate surgery intervention in this difficult situation!    Cephas DarbyJames Granfortuna, MD, FACP  Hematology-Oncology/Internal Medicine [phone]  No response to pre-op platelet transfusion. Best we can do at this point is give red cell transfusion support for continued bleeding until we see a response to splenectomy/thrombopoietin. I have seen it take a week for some people to respond to splenectomy. If otherwise stable, will proceed with 3rd dose of Rituxan tomorrow. Continue weekly N-Plate.

## 2015-09-16 NOTE — H&P (View-Only) (Signed)
Patient ID: [name], female   DOB: 02/13/1976, 40 y.o.   MRN: [id number]     [address] [place], [place] [address]    Phone: [phone] FAX: [phone]     Subjective: hgb down 8.4/25.9k.  plt down to [place].   Objective:  Vital signs:  Filed Vitals:   09/14/15 2008 09/14/15 2011 09/14/15 2237 09/15/15 0306  BP:  _0  Pulse:  97  91  Temp: 98.7 F (37.1 C)  98.5 F (36.9 C) 98.6 F (37 C)  TempSrc: Axillary  Axillary Axillary  Resp:  21  23  Height:      Weight:      SpO2:  97% 100% 97%    Last BM Date: 09/14/15  Intake/Output   Yesterday:  06/27 0701 - 06/28 0700 In: 320 [P.O.:120; I.V.:200] Out: -  I/O last 3 completed shifts: In: 320 [P.O.:120; I.V.:200] Out: -     Physical Exam: General: Pt awake/alert/oriented x4 in no acute distress Abdomen: Soft. Nondistended. Non tender. No evidence of peritonitis. No incarcerated hernias.    Problem List:   Principal Problem:   Acute ITP ([place]) Active Problems:   Chronic hepatitis B ([facility])   Nontraumatic subarachnoid hemorrhage ([facility])   Severe thrombocytopenia ([facility])    Results:   Labs: Results for orders placed or performed during the hospital encounter of 08/28/15 (from the past 48 hour(s))  CBC with Differential/Platelet     Status: Abnormal   Collection Time: 09/14/15  5:15 AM  Result Value Ref Range   WBC 9.4 4.0 - 10.5 K/uL   RBC 3.60 (L) 3.87 - 5.11 MIL/uL   Hemoglobin 9.6 (L) 12.0 - 15.0 g/dL   HCT 29.6 (L) 36.0 - 46.0 %   MCV 82.2 78.0 - 100.0 fL   MCH 26.7 26.0 - 34.0 pg   MCHC 32.4 30.0 - 36.0 g/dL   RDW 13.8 11.5 - 15.5 %   Platelets 11 (LL) 150 - 400 K/uL    Comment: REPEATED TO VERIFY CRITICAL VALUE NOTED.  VALUE IS CONSISTENT WITH PREVIOUSLY REPORTED AND CALLED VALUE.    Neutrophils Relative % 74 %   Neutro Abs 7.0 1.7 - 7.7 K/uL   Lymphocytes Relative 11 %   Lymphs Abs 1.0 0.7 - 4.0  K/uL   Monocytes Relative 8 %   Monocytes Absolute 0.8 0.1 - 1.0 K/uL   Eosinophils Relative 7 %   Eosinophils Absolute 0.7 0.0 - 0.7 K/uL   Basophils Relative 0 %   Basophils Absolute 0.0 0.0 - 0.1 K/uL  CBC with Differential/Platelet     Status: Abnormal   Collection Time: 09/15/15  5:30 AM  Result Value Ref Range   WBC 9.9 4.0 - 10.5 K/uL   RBC 3.16 (L) 3.87 - 5.11 MIL/uL   Hemoglobin 8.4 (L) 12.0 - 15.0 g/dL   HCT 25.9 (L) 36.0 - 46.0 %   MCV 82.0 78.0 - 100.0 fL   MCH 26.6 26.0 - 34.0 pg   MCHC 32.4 30.0 - 36.0 g/dL   RDW 13.8 11.5 - 15.5 %   Platelets 6 (LL) 150 - 400 K/uL    Comment: SPECIMEN CHECKED FOR CLOTS REPEATED TO VERIFY CRITICAL VALUE NOTED.  VALUE IS CONSISTENT WITH PREVIOUSLY REPORTED AND CALLED VALUE.    Neutrophils Relative % 74 %   Neutro Abs 7.3 1.7 - 7.7 K/uL   Lymphocytes Relative 12 %   Lymphs  Abs 1.1 0.7 - 4.0 K/uL   Monocytes Relative 8 %   Monocytes Absolute 0.8 0.1 - 1.0 K/uL   Eosinophils Relative 6 %   Eosinophils Absolute 0.6 0.0 - 0.7 K/uL   Basophils Relative 0 %   Basophils Absolute 0.0 0.0 - 0.1 K/uL  Basic metabolic panel     Status: Abnormal   Collection Time: 09/15/15  5:30 AM  Result Value Ref Range   Sodium 135 135 - 145 mmol/L   Potassium 3.3 (L) 3.5 - 5.1 mmol/L   Chloride 102 101 - 111 mmol/L   CO2 23 22 - 32 mmol/L   Glucose, Bld 138 (H) 65 - 99 mg/dL   BUN 5 (L) 6 - 20 mg/dL   Creatinine, Ser 0.76 0.44 - 1.00 mg/dL   Calcium 8.2 (L) 8.9 - 10.3 mg/dL   GFR calc non Af Amer >60 >60 mL/min   GFR calc Af Amer >60 >60 mL/min    Comment: (NOTE) The eGFR has been calculated using the CKD EPI equation. This calculation has not been validated in all clinical situations. eGFR's persistently <60 mL/min signify possible Chronic Kidney Disease.    Anion gap 10 5 - 15    Imaging / Studies: No results found.  Medications / Allergies:  Scheduled Meds: . sodium chloride   Intravenous Once  . aminocaproic acid  1 g Oral Q4H  .  antiseptic oral rinse  7 mL Mouth Rinse BID  . norethindrone  5 mg Oral Daily  . polyethylene glycol  17 g Oral Daily  . senna  2 tablet Oral Daily   Continuous Infusions:  PRN Meds:.acetaminophen, oxyCODONE, phenylephrine, sodium chloride flush, traMADol, traZODone  Antibiotics: Anti-infectives    None       Assessment/Plan ITP-platelet count down to 6K this AM from 11. Appreciate excellent management by the medicine team. Surgery on Thursday.Type and cross for 4units of PRBCs, 2 units of platelets on hold and 2 ready to give at start of surgery on Thursday. That is if her platelet count is "stable" or increased. I have discussed surgical risks in detail with the patient and her daughter including but not limited to bleeding intra and post operatively which she is at extremely high risk of, death, infection, anesthesia risks.  The patient verbalizes understanding and wishes to proceed.  Spoke with blood bank regarding blood products.   FEN-regular diet, NPO after MN ID-no issues VTE prophylaxis-none due to above Dispo-OR in AM     Erby Pian, [facility] Surgery Pager [phone](7A-4:30P) For consults and floor pages call [phone](7A-4:30P)  09/15/2015 8:47 AM

## 2015-09-16 NOTE — Transfer of Care (Signed)
Immediate Anesthesia Transfer of Care Note  Patient: [name]  Procedure(s) Performed: Procedure(s): OPEN SPLENECTOMY (N/A)  Patient Location: PACU  Anesthesia Type:General  Level of Consciousness: awake, alert , oriented and patient cooperative  Airway & Oxygen Therapy: Patient Spontanous Breathing and Patient connected to face mask oxygen  Post-op Assessment: Report given to RN, Post -op Vital signs reviewed and stable and Patient moving all extremities  Post vital signs: Reviewed and stable  Last Vitals:  Filed Vitals:   09/16/15 0700 09/16/15 0802  BP: 97/54 85/54  Pulse: 92 91  Temp: 37.3 C   Resp: 15 21    Last Pain:  Filed Vitals:   09/16/15 0821  PainSc: 4       Patients Stated Pain Goal: 7 (09/14/15 1149)  Complications: No apparent anesthesia complications

## 2015-09-16 NOTE — Progress Notes (Signed)
Subjective Patient is NPO this morning in anticipation of open splenectomy this morning. Platelet count <5000 this morning, not any sustained improvement.  Continues to have oozing mucosal bleeding. Reported pounding occipital headache last night, similar to last time this was exacerbated with n-plate. She also had an episode of worsened generalized weakness after taking oxycodone and trazodone that quickly improved.  Objective: Vital signs in last 24 hours: Filed Vitals:   09/16/15 0400 09/16/15 0500 09/16/15 0700 09/16/15 0802  BP: 104/63 93/51 97/54  85/54  Pulse: 95 97 92 91  Temp:  99 F (37.2 C) 99.2 F (37.3 C)   TempSrc:  Axillary Axillary   Resp: 19 15 15 21   Height:      Weight:      SpO2: 98% 99% 99% 99%   Weight change:   Intake/Output Summary (Last 24 hours) at 09/16/15 0932 Last data filed at 09/16/15 0801  Gross per 24 hour  Intake 1045.83 ml  Output      0 ml  Net 1045.83 ml   General: NAD, obese HEENT: bleeding on buccal mucosa as well as palette and gingiva, no active epistaxis, small right scleral hemorrhage Abdomen: Soft, no guarding or rebound Skin: Petechiae present on face, upper chest, arms Extremities: no pedal edema  CBC:  Recent Labs Lab 09/15/15 0530 09/16/15 0538  WBC 9.9 8.9  NEUTROABS 7.3 6.3  HGB 8.4* 8.1*  HCT 25.9* 25.3*  MCV 82.0 84.1  PLT 6* <5*     Medications: I have reviewed the patient's current medications. Scheduled Meds: . [MAR Hold] sodium chloride   Intravenous Once  . [MAR Hold] aminocaproic acid  1 g Oral Q4H  . [MAR Hold] antiseptic oral rinse  7 mL Mouth Rinse BID  .  ceFAZolin (ANCEF) IV  2 g Intravenous To SSTC  . [MAR Hold] norethindrone  5 mg Oral Daily  . [MAR Hold] polyethylene glycol  17 g Oral Daily  . [MAR Hold] senna  2 tablet Oral Daily   Continuous Infusions: . lactated ringers 50 mL/hr at 09/16/15 0902   PRN Meds:.[MAR Hold] acetaminophen, [MAR Hold] oxyCODONE, [MAR Hold] phenylephrine,  [MAR Hold] sodium chloride flush, [MAR Hold] traMADol, [MAR Hold] traZODone Assessment/Plan: Severe thrombocytopenia 2/2 ITP- Platelet count remains undetectable today. S/p IVIG x 6/10-11 and 6/13 and decadron 6/10 -13. Initial had CT showing punctate hemorrhages, with repeat CT head wo contrast 6/21 negative for bleeding. She has ongoing mucosal bleeds with a mild downward trend in Hgb. So far no significant clinical response to first or second line medical treatment.  N-plate repeat dosing yesterday, with some headache noted most likely a side effect. She is undergoing open splenectomy today. Hopefully this is free of complications and she may have a brisk response in her platelets as early as tomorrow. Next dose of rituxan planned 6/30. - Splenectomy today - Continue Rituxan 6/16>>> - continue aygestin 5 mg daily  - CBC daily - Surgery and hematology services greatly appreciated  Dizziness- Persistent postural dizziness. She also reports some generalized weakness that may be from deconditioning, anemia, hypovolemia with poor PO intake, mild hypokalemia, and analgesics. Providing  - Continue fall precautions/up with assist - Maintenance fluids D5 1/2NS if PO intake remaining low  FULL CODE Diet: NPO VTE ppx: SCDs  Dispo: Disposition is deferred at this time, awaiting improvement of current medical problems.  The patient does have a current PCP (No Pcp Per Patient) and does not [facility] after discharge.  LOS: 19 days   Fuller Planhristopher W Jaun Galluzzo, MD 09/16/2015, 9:32 AM

## 2015-09-16 NOTE — Progress Notes (Signed)
PT Cancellation Note  Patient Details Name: [name] MRN: [id number] DOB: 01/22/1976   Cancelled Treatment:    Reason Eval/Treat Not Completed: Patient at procedure or test/unavailable. Pt in surgery. PT to check back tomorrow for re-evaluation.   Gertie BaronMegan Kyllian Clingerman, VirginiaPTA #[phone]   09/16/2015, 9:48 AM

## 2015-09-16 NOTE — Progress Notes (Signed)
Paged by nursing staff stating patient is complaining of dizziness and numbness after she was given a dose of oxycodone 5 mg for back pain and trazodone 100 mg for sleep at the same time. Nursing staff has started the patient on 1 L bolus of normal saline. I went and evaluated the patient at bedside immediately. Patient reports having worsening dizziness, numbness and inability to move her entire body after she was given both of these medications. Reports having dizziness for the past few days but states it became worse during this episode. Also reports having intermittent shortness of breath ("choking" sensation) at that time, however, denies having any chest pain. Denies having any headache or abdominal pain. States her symptoms lasted just for a few minutes and now she feels better. States her dizziness is better, she is able to breathe comfortably, and able to move and feel her body now. States her mucosal bleeding (nose and gums) is stable and has not worsened.  Vitals: Blood pressure 107/81, heart rate 89, SpO2 100% on room air HEENT: Slow mucosal bleeding. Dried blood in nostrils. Blood clots on gingiva. Heart: RRR. S1 and S2 appreciated. Lungs: CTAB Abdomen: Soft, nontender, nondistended, positive bowel sounds. No guarding. Neuro: AAO 3. Speech is fluent. Moving all extremities spontaneously. Cranial nerves II-XII grossly intact. Coordination normal. Strength 5/5 and sensation to light touch grossly intact in bilateral upper and lower extremities.  Assessment/plan: Patient's current presentation is likely due to oversedation from administration of both oxycodone and trazodone at the same time. Since the administration of fluid bolus, her blood pressure has improved and she is not tachycardic anymore. Neuro exam is nonfocal. Will hold off ordering head imaging at this time. -Advised nursing staff to administer oxycodone and trazodone at least 6 hours apart -Consider giving STAT platelet  transfusion and recombinant factor 7a if patient has a major bleeding event or focal neuro deficits  -Continue to monitor

## 2015-09-17 ENCOUNTER — Encounter (HOSPITAL_COMMUNITY): Payer: Self-pay | Admitting: General Surgery

## 2015-09-17 DIAGNOSIS — D72829 Elevated white blood cell count, unspecified: Secondary | ICD-10-CM

## 2015-09-17 DIAGNOSIS — Z9081 Acquired absence of spleen: Secondary | ICD-10-CM

## 2015-09-17 DIAGNOSIS — Z959 Presence of cardiac and vascular implant and graft, unspecified: Secondary | ICD-10-CM

## 2015-09-17 DIAGNOSIS — Z960 Presence of urogenital implants: Secondary | ICD-10-CM

## 2015-09-17 DIAGNOSIS — R5082 Postprocedural fever: Secondary | ICD-10-CM

## 2015-09-17 LAB — CBC WITH DIFFERENTIAL/PLATELET
BASOS ABS: 0.2 10*3/uL — AB (ref 0.0–0.1)
Basophils Relative: 2 %
EOS PCT: 4 %
Eosinophils Absolute: 0.5 10*3/uL (ref 0.0–0.7)
HEMATOCRIT: 29.7 % — AB (ref 36.0–46.0)
HEMOGLOBIN: 9.7 g/dL — AB (ref 12.0–15.0)
LYMPHS PCT: 12 %
Lymphs Abs: 1.4 10*3/uL (ref 0.7–4.0)
MCH: 27.9 pg (ref 26.0–34.0)
MCHC: 32.7 g/dL (ref 30.0–36.0)
MCV: 85.3 fL (ref 78.0–100.0)
Monocytes Absolute: 1.5 10*3/uL — ABNORMAL HIGH (ref 0.1–1.0)
Monocytes Relative: 13 %
NEUTROS PCT: 69 %
Neutro Abs: 8.1 10*3/uL — ABNORMAL HIGH (ref 1.7–7.7)
Platelets: <5 10*3/uL — CL (ref 150–400)
RBC: 3.48 MIL/uL — AB (ref 3.87–5.11)
RDW: 14.4 % (ref 11.5–15.5)
WBC MORPHOLOGY: INCREASED
WBC: 11.7 10*3/uL — AB (ref 4.0–10.5)

## 2015-09-17 LAB — PREPARE PLATELET PHERESIS
UNIT DIVISION: 0
UNIT DIVISION: 0
Unit division: 0
Unit division: 0

## 2015-09-17 LAB — BASIC METABOLIC PANEL
Anion gap: 8 (ref 5–15)
BUN: <5 mg/dL — ABNORMAL LOW (ref 6–20)
CO2: 25 mmol/L (ref 22–32)
Calcium: 8.1 mg/dL — ABNORMAL LOW (ref 8.9–10.3)
Chloride: 105 mmol/L (ref 101–111)
Creatinine, Ser: 0.65 mg/dL (ref 0.44–1.00)
GFR calc Af Amer: >60 mL/min (ref >60)
GFR calc non Af Amer: >60 mL/min (ref >60)
Glucose, Bld: 109 mg/dL — ABNORMAL HIGH (ref 65–99)
Potassium: 3.7 mmol/L (ref 3.5–5.1)
Sodium: 138 mmol/L (ref 135–145)

## 2015-09-17 LAB — BLOOD CULTURE ID PANEL (REFLEXED)
Acinetobacter baumannii: NOT DETECTED
CANDIDA ALBICANS: NOT DETECTED
CANDIDA GLABRATA: NOT DETECTED
CANDIDA KRUSEI: NOT DETECTED
CANDIDA PARAPSILOSIS: NOT DETECTED
CANDIDA TROPICALIS: NOT DETECTED
Carbapenem resistance: NOT DETECTED
ENTEROBACTER CLOACAE COMPLEX: NOT DETECTED
ESCHERICHIA COLI: NOT DETECTED
Enterobacteriaceae species: NOT DETECTED
Enterococcus species: NOT DETECTED
Haemophilus influenzae: NOT DETECTED
KLEBSIELLA OXYTOCA: NOT DETECTED
KLEBSIELLA PNEUMONIAE: NOT DETECTED
LISTERIA MONOCYTOGENES: NOT DETECTED
METHICILLIN RESISTANCE: NOT DETECTED
Neisseria meningitidis: NOT DETECTED
PROTEUS SPECIES: NOT DETECTED
Pseudomonas aeruginosa: NOT DETECTED
SERRATIA MARCESCENS: NOT DETECTED
STREPTOCOCCUS PNEUMONIAE: NOT DETECTED
Staphylococcus aureus (BCID): NOT DETECTED
Staphylococcus species: NOT DETECTED
Streptococcus agalactiae: NOT DETECTED
Streptococcus pyogenes: NOT DETECTED
Streptococcus species: NOT DETECTED
Vancomycin resistance: NOT DETECTED

## 2015-09-17 LAB — URINE MICROSCOPIC-ADD ON

## 2015-09-17 LAB — URINALYSIS, ROUTINE W REFLEX MICROSCOPIC
GLUCOSE, UA: NEGATIVE mg/dL
KETONES UR: 40 mg/dL — AB
NITRITE: NEGATIVE
PROTEIN: 100 mg/dL — AB
Specific Gravity, Urine: 1.02 (ref 1.005–1.030)
pH: 6 (ref 5.0–8.0)

## 2015-09-17 LAB — MAGNESIUM: MAGNESIUM: 1.8 mg/dL (ref 1.7–2.4)

## 2015-09-17 MED ORDER — ACETAMINOPHEN 325 MG PO TABS
650.0000 mg | ORAL_TABLET | Freq: Once | ORAL | Status: AC
  Administered 2015-09-17: 650 mg via ORAL
  Filled 2015-09-17: qty 2

## 2015-09-17 MED ORDER — VANCOMYCIN HCL 10 G IV SOLR
2000.0000 mg | Freq: Once | INTRAVENOUS | Status: AC
  Administered 2015-09-17: 2000 mg via INTRAVENOUS
  Filled 2015-09-17: qty 2000

## 2015-09-17 MED ORDER — WHITE PETROLATUM GEL
Status: AC
  Administered 2015-09-17: 0.2
  Filled 2015-09-17: qty 1

## 2015-09-17 MED ORDER — PIPERACILLIN-TAZOBACTAM 3.375 G IVPB
3.3750 g | Freq: Three times a day (TID) | INTRAVENOUS | Status: DC
  Administered 2015-09-17: 3.375 g via INTRAVENOUS
  Filled 2015-09-17 (×2): qty 50

## 2015-09-17 MED ORDER — AMINOCAPROIC ACID 250 MG/ML IV SOLN
4.0000 g | INTRAVENOUS | Status: DC
  Administered 2015-09-17 – 2015-09-22 (×30): 4 g via INTRAVENOUS
  Filled 2015-09-17 (×34): qty 16

## 2015-09-17 MED ORDER — PIPERACILLIN-TAZOBACTAM 3.375 G IVPB 30 MIN
3.3750 g | INTRAVENOUS | Status: AC
  Administered 2015-09-17: 3.375 g via INTRAVENOUS
  Filled 2015-09-17: qty 50

## 2015-09-17 MED ORDER — VANCOMYCIN HCL IN DEXTROSE 1-5 GM/200ML-% IV SOLN
1000.0000 mg | Freq: Three times a day (TID) | INTRAVENOUS | Status: DC
  Filled 2015-09-17 (×2): qty 200

## 2015-09-17 MED ORDER — DEXTROSE 5 % IV SOLN
2.0000 g | Freq: Two times a day (BID) | INTRAVENOUS | Status: DC
  Administered 2015-09-17 – 2015-09-19 (×5): 2 g via INTRAVENOUS
  Filled 2015-09-17 (×7): qty 2

## 2015-09-17 NOTE — Progress Notes (Addendum)
Pharmacy Antibiotic Note  [name] is a 40 y.o. female admitted on 08/28/2015 and is being treated for refractory ITP, s/p splenectomy on 6/29.  Pharmacy has been consulted for cefepime dosing. Zosyn discontinued due to potential anti-platelets effects, last dose at 17:00.   Plan: Cefepime 2 g IV q12h Monitor renal function, clinical progress, and culture data  Height: 5\' 1"  (154.9 cm) Weight: 234 lb (106.142 kg) IBW/kg (Calculated) : 47.8  Temp (24hrs), Avg:101.3 F (38.5 C), Min:97.8 F (36.6 C), Max:103.1 F (39.5 C)   Recent Labs Lab 09/14/15 0515 09/15/15 0530 09/16/15 0538 09/16/15 1323 09/17/15 0435  WBC 9.4 9.9 8.9 9.6 11.7*  CREATININE  --  0.76  --   --  0.65    Estimated Creatinine Clearance: 104.9 mL/min (by C-G formula based on Cr of 0.65).    No Known Allergies  Antimicrobials this admission: Cefazolin 6/29 pre-op Vancomycin 6/30 >> 6/30 Zosyn 6/30 >> 6/30 Cefepime 6/30 >>  Dose adjustments this admission: none  Microbiology results: 6/30 BCx: pending 6/10 MRSA PCR neg  Thank you for allowing pharmacy to be a part of this patient's care.  Spruce PineJennifer Laramie, 17[address] Rainbow BoulevardPharm.D., BCPS Clinical Pharmacist Pager: [phone] 09/17/2015 5:25 PM

## 2015-09-17 NOTE — Progress Notes (Signed)
1 Day Post-Op  Subjective: She is frustrated. Still having some bleeding from gums and vagina  Objective: Vital signs in last 24 hours: Temp:  [97 F (36.1 C)-103.1 F (39.5 C)] 102.5 F (39.2 C) (06/30 0700) Pulse Rate:  [73-129] 129 (06/30 0800) Resp:  [11-22] 22 (06/30 0800) BP: (91-113)/(59-85) 108/65 mmHg (06/30 0800) SpO2:  [90 %-100 %] 95 % (06/30 0800) Arterial Line BP: (113-158)/(58-75) 116/65 mmHg (06/30 0800) Last BM Date: 09/15/15  Intake/Output from previous day: 06/29 0701 - 06/30 0700 In: 5597.4 [I.V.:3485.4; Blood:2112] Out: 1875 [Urine:1375; Blood:500] Intake/Output this shift: Total I/O In: 550 [I.V.:550] Out: -   Resp: rhonchi bilaterally Cardio: regular rate and rhythm GI: soft, tender near incision. incision looks good  Lab Results:   Recent Labs  09/16/15 1323 09/17/15 0435  WBC 9.6 11.7*  HGB 10.1* 9.7*  HCT 30.3* 29.7*  PLT <5* PENDING   BMET  Recent Labs  09/15/15 0530 09/16/15 1137 09/17/15 0435  NA 135 142 138  K 3.3* 3.6 3.7  CL 102  --  105  CO2 23  --  25  GLUCOSE 138*  --  109*  BUN 5*  --  <5*  CREATININE 0.76  --  0.65  CALCIUM 8.2*  --  8.1*   PT/INR No results for input(s): LABPROT, INR in the last 72 hours. ABG  Recent Labs  09/16/15 1137  PHART 7.365  HCO3 21.7    Studies/Results: Dg Chest Port 1 View  09/16/2015  CLINICAL DATA:  eval for placement of right central line - post op splenectomy EXAM: PORTABLE CHEST 1 VIEW COMPARISON:  12/04/2010 FINDINGS: The heart is mildly enlarged. There is pulmonary vascular congestion. No overt alveolar edema is identified. Right IJ central line has been placed, tip overlying the level of superior vena cava. No evidence for pneumothorax. IMPRESSION: Interval placement of right IJ central line.  No pneumothorax. Pulmonary vascular congestion noted. Electronically Signed   By: Norva PavlovElizabeth  Brown M.D.   On: 09/16/2015 13:40    Anti-infectives: Anti-infectives    Start      Dose/Rate Route Frequency Ordered Stop   09/16/15 0600  ceFAZolin (ANCEF) IVPB 2g/100 mL premix    Comments:  Pharmacy may adjust dosing strength, interval, or rate of medication as needed for optimal therapy for the patient  Send with patient on call to the OR.  Anesthesia to complete antibiotic administration <5660min prior to incision [facility].   2 g 200 mL/hr over 30 Minutes Intravenous To Short Stay 09/15/15 1607 09/16/15 1025      Assessment/Plan: s/p Procedure(s): OPEN SPLENECTOMY (N/A) ITP  Ice chips for the first couple days because of recent surgery and tying off of short gastrics Monitor plt and hg  LOS: 20 days    TOTH III,PAUL S 09/17/2015

## 2015-09-17 NOTE — Progress Notes (Signed)
Patient ID: [name], female   DOB: 03/04/1976, 40 y.o.   MRN: [id number] Hematology: d1 post op splenectomy  Temp to 102.7 BP at her low baseline Persistent oro/gingival bleeding Increase vaginal bleeding Lungs overall clear Abdominal wound clean and dry No focal neuro deficit PERRL, motor 5/5 Lab: platelets remain low 2,000 machine count Hb 9.7 stable c/w post op value (10.1) Impression: Refractory ITP 80% chance she will respond to splenectomy. Rapid destruction of transfused platelets supports immune mechanism Rec: She is NPO per surgeon for next 24-48 hours. Please increase IV fluids RBC support as needed I will hold next dose of Rituxan due today until she is more stable/afebrile Although fever likely just post-op/atelectasis, she is now immunocompromised and I would provide broad spectrum antibiotics pending culture results I don't see PO or IV AMICAR on active med list. I will re-order IV. Daughter here. Patient very tearful, depressed. Dr Carolynne Edouardoth here. Status reviewed

## 2015-09-17 NOTE — Progress Notes (Addendum)
PT Cancellation Note  Patient Details Name: [name] MRN: [id number] DOB: 04/04/1975   Cancelled Treatment:    Reason Eval/Treat Not Completed: Other (comment).  PT did not receive new post-op orders.  I paged Internal Medicine resident re: if it is appropriate for [facility] to resume PT at this time.  Awaiting call back.  PT to check back later.  Of particular concern are platelets<5. RN also calling surgeon, Dr. Carolynne Edouardoth to check with surgery as well.  Thanks,   Rollene Rotundaebecca B. Zaineb Nowaczyk, PT, DPT [phone]   09/17/2015, 12:36 PM Addendum: Dr. Carolynne Edouardoth called RN back and was agreeable to very limited mobility.  RN and PT agreed EOB would be enough for today given her post-op status, continued bleeding, and significantly low platelet count.  Rollene Rotundaebecca B. Mosiah Bastin, PT, DPT [phone]

## 2015-09-17 NOTE — Progress Notes (Addendum)
ANTIBIOTIC CONSULT NOTE - INITIAL  Pharmacy Consult:  Vancomycin / Zosyn Indication:  Sepsis  No Known Allergies  Patient Measurements: Height: 5\' 1"  (154.9 cm) Weight: 234 lb (106.142 kg) IBW/kg (Calculated) : 47.8  Vital Signs: Temp: 102.5 F (39.2 C) (06/30 0700) Temp Source: Axillary (06/30 0700) BP: 108/65 mmHg (06/30 0800) Pulse Rate: 129 (06/30 0800) Intake/Output from previous day: 06/29 0701 - 06/30 0700 In: 5597.4 [I.V.:3485.4; Blood:2112] Out: 1875 [Urine:1375; Blood:500] Intake/Output from this shift: Total I/O In: 550 [I.V.:550] Out: -   Labs:  Recent Labs  09/15/15 0530 09/16/15 0538 09/16/15 1137 09/16/15 1323 09/17/15 0435  WBC 9.9 8.9  --  9.6 11.7*  HGB 8.4* 8.1* 7.8* 10.1* 9.7*  PLT 6* <5*  --  <5* PENDING  CREATININE 0.76  --   --   --  0.65   Estimated Creatinine Clearance: 104.9 mL/min (by C-G formula based on Cr of 0.65). No results for input(s): VANCOTROUGH, VANCOPEAK, VANCORANDOM, GENTTROUGH, GENTPEAK, GENTRANDOM, TOBRATROUGH, TOBRAPEAK, TOBRARND, AMIKACINPEAK, AMIKACINTROU, AMIKACIN in the last 72 hours.   Microbiology: Recent Results (from the past 720 hour(s))  MRSA PCR Screening     Status: None   Collection Time: 08/28/15  3:37 PM  Result Value Ref Range Status   MRSA by PCR NEGATIVE NEGATIVE Final    Comment:        The GeneXpert MRSA Assay (FDA approved for NASAL specimens only), is one component of a comprehensive MRSA colonization surveillance program. It is not intended to diagnose MRSA infection nor to guide or monitor treatment for MRSA infections. [facility]     Medical History: Past Medical History  Diagnosis Date  . Depression      Assessment: [age] YOF familiar to Pharmacy from peripheral monitoring of Rituxan and Nplate.  Briefly, patient has ITP and presented on 08/28/15 with gingival bleeding that she could not control.  Bleeding is persistent and she is s/p IVIG, Decadron,  Rituxan, norethindrone, and Nplate.  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  Pharmacy consulted to initiate vancomycin and Zosyn for sepsis.  Her renal function has been stable.   Goal of Therapy:  Vancomycin trough level 15-20 mcg/ml   Plan:  - Vanc 2000mg  IV x 1, then 1gm IV Q8H - Zosyn 3.375gm IV x 1 (give over 30 min), then 3.375gm IV Q8H, 4 hr infusion - Monitor renal fxn, clinical progress, vanc trough as indicated - F/U vaccination once stable   Armanda Forand D. Laney Potashang, PharmD, BCPS Pager:  [phone] 09/17/2015, 9:05 AM

## 2015-09-17 NOTE — Progress Notes (Signed)
Subjective Surgery without major acute complication. Continues to be extremely thrombocytopenic with mucosal bleeding from mouth, vagina, and hematuria. Developed tachycardia and fever this morning up to 103.40F. Her headache from yesterday is improved and pain is well controlled on PCA.  Objective: Vital signs in last 24 hours: Filed Vitals:   09/17/15 0700 09/17/15 0800 09/17/15 0927 09/17/15 1100  BP: 109/62 108/65  106/78  Pulse: 112 129  118  Temp: 102.5 F (39.2 C)  102.6 F (39.2 C) 100.3 F (37.9 C)  TempSrc: Axillary  Axillary Axillary  Resp: 17 22  17   Height:      Weight:      SpO2: 95% 95%  94%   Weight change:   Intake/Output Summary (Last 24 hours) at 09/17/15 1153 Last data filed at 09/17/15 1100  Gross per 24 hour  Intake 4456.33 ml  Output   1725 ml  Net 2731.33 ml   General: NAD, obese HEENT: Continues to have gingival bleeding,  Respiratory: Noisy upper airway due to secretions Abdomen: Clean, dry LUQ abdominal incision with dressing in place, no apparent bleeding or hematoma Skin: Petechiae present on face, upper chest, arms Extremities: no pedal edema  CBC:  Recent Labs Lab 09/16/15 0538  09/16/15 1323 09/17/15 0435  WBC 8.9  --  9.6 11.7*  NEUTROABS 6.3  --   --  8.1*  HGB 8.1*  < > 10.1* 9.7*  HCT 25.3*  < > 30.3* 29.7*  MCV 84.1  --  83.9 85.3  PLT <5*  --  <5* <5*  < > = values in this interval not displayed.   Medications: I have reviewed the patient's current medications. Scheduled Meds: . sodium chloride   Intravenous Once  . aminocaproic acid  1 g Oral Q4H  . aminocaproic acid (AMICAR) IVPB  4 g Intravenous Q4H  . antiseptic oral rinse  7 mL Mouth Rinse BID  . HYDROmorphone   Intravenous Q4H  . piperacillin-tazobactam  3.375 g Intravenous STAT  . piperacillin-tazobactam (ZOSYN)  IV  3.375 g Intravenous Q8H  . vancomycin  2,000 mg Intravenous Once  . [START ON 09/18/2015] vancomycin  1,000 mg Intravenous Q8H   Continuous  Infusions: . lactated ringers 50 mL/hr at 09/16/15 1250   PRN Meds:.acetaminophen, diphenhydrAMINE **OR** diphenhydrAMINE, morphine injection, naloxone **AND** sodium chloride flush, ondansetron (ZOFRAN) IV, phenylephrine, sodium chloride flush Assessment/Plan: Severe thrombocytopenia 2/2 ITP- Platelet count remains undetectable today. S/p IVIG x 6/10-11 and 6/13 and decadron 6/10 -13. Initial had CT showing punctate hemorrhages, with repeat CT head wo contrast 6/21 negative for bleeding. She has ongoing mucosal bleeds with a mild downward trend in Hgb. So far no significant clinical response to first or second line medical treatment.  Splenectomy yesterday, no improvement in platelet count by day 1. Some patients can take longer to resolve the majority improving within 1 week postoperatively. Subsequent dosing of rituxan will be held for now particularly with fever and tachycardia since infusion can commonly worsen hypotension. - Continue Rituxan 6/16>>>held dose 6/30 - continue aygestin 5 mg daily  - CBC daily - Surgery and hematology services greatly appreciated - Continue NPO with maintenance fluids after abdominal surgery with gastric vessel involvement  Fever: Temperature to 103.1 overnight postsplenectomy. She was not previously immunized prior to the operation so is at risk for an overwhelming post splenectomy infection and we will empirically cover with broad spectrum antibiotics. She is further immunosuppressed medically from her ITP treatments. Urinalysis is positive for many bacteria and with  foley catheter in place UTI could also be an underlying cause. Atelectasis may also be contributory 1 day post-op but very high fever and her risk factors make this less likely to explain her fever. She has not been hypoxic. Fortunately she has not become significantly hypotensive during or after surgery. -Start empiric coverage with vancomycin/zosyn -Blood cultures x2 today -F/U UA -Also encourage  spirometry for atelectasis  Dizziness- Persistent postural dizziness. She also reports some generalized weakness that may be from deconditioning, anemia, hypovolemia with poor PO intake, mild hypokalemia, and analgesics. Providing  - Continue fall precautions/up with assist  FULL CODE Diet: NPO VTE ppx: SCDs  Dispo: Disposition is deferred at this time, awaiting improvement of current medical problems.  The patient does have a current PCP (No Pcp Per Patient) and [facility] not [facility] after discharge.  LOS: 20 days   Fuller Planhristopher W Niki Cosman, MD 09/17/2015, 11:53 AM

## 2015-09-17 NOTE — Progress Notes (Addendum)
Patient ID: [name], female   DOB: 03/10/1976, 40 y.o.   MRN: [id number] Hematology: Persistent temp. Continued oral/vaginal bleeding which has not slowed down. Wound remains dry I have revised IV AMICAR dose: now 4 grams Q 4 hrs. I would stop Vancomycin - not infrequent cause of drug related thrombocytopenia and main organism we need to cover right now is PCN sensitive staph. Would also consider change from Zosyn to Ceftazidime due to potential anti-platelet effects of Zosyn but no antibiotic completely devoid of potential Heme side effects. With increased bleeding, may need to check CBC Q12. Transfusion support prn. Multiple family members present. Impression: Refractory ITP Continue maximum support pending response to med/surg Rx.   Cephas DarbyJames Orris Perin, MD, FACP  Hematology-Oncology/Internal Medicine [phone]  Addendum: For life threatening hemorrhage, it might be best to use Prothrombin Complex Concentrate K-centra, 50 units/kg first which can be given over 20 minutes instead of recombinant VIIa which will only work if there are platelets on board and they will take time to transfuse. I would still give platelets when available in emergency situation. Discussed with attending MD & senior resident.

## 2015-09-17 NOTE — Progress Notes (Signed)
Physical Therapy Treatment Patient Details Name: [name] MRN: [id number] DOB: 08/18/1975 Today's Date: 09/17/2015    History of Present Illness [name] is a 40yo woman with no significant PMH who presents with gingival and vaginal bleeding, hematuria which she could not control. Apparently in the ED waiting room, she also developed a petechial rash on her LE, pannus and neck. Her platelet count was noted to be zero in the ED. CT head showed a subarrachnoid hemorrhage, and she complained of headache.Pt to have been diagnosed with refractory ITP and is s/p splenectomy on 09/16/15 as she failed to respond to medical management with IVIG, steroids, and rituximab.  Post op she developed a high fever and tachycardia with new leukocytosis.  She is at risk for overwhelming post splenectomy infections (OPSIs).       PT Comments    Surgery MD gave clearance for limited safe mobility given high risk of bleeding and fall risk at this time RN and PT agreed EOB would likely be best and all pt would be able to safely tolerate today.  Re-evaluation complete and pt still having significant lightheadedness EOB (given platelet count this is to be expected).  She was able to sit EOB for ~5 mins before getting too lightheaded to continue.   PT to follow acutely to continue to safely mobilize as strength and medical stability allow.    Follow Up Recommendations  Supervision/Assistance - 24 hour;Home health PT     Equipment Recommendations  Rolling walker with 5" wheels;Wheelchair (measurements PT);Wheelchair cushion (measurements PT)    Recommendations for Other Services OT consult     Precautions / Restrictions Precautions Precautions: Fall Precaution Comments: bleeding, dizziness and severely low platelet count.     Mobility  Bed Mobility Overal bed mobility: Needs Assistance Bed Mobility: Supine to Sit;Sit to Supine     Supine to sit: HOB elevated;+2 for physical  assistance;Mod assist Sit to supine: +2 for physical assistance;Mod assist   General bed mobility comments: Two person mod to help support trunk to get to sitting from maximally elevated HOB.  Assist needed at trunk and legs to get back to supine as well.  HOB left elevated due to pulling at abdomen with transitions.  As she is better able to tolerate HOB flatter, we will progress to a log roll to get to sitting.   Transfers                 General transfer comment: NT, PT/RN agreed that EOB would be taxing enough today given her post op status and continued extremely low BP.  O2 sats dropped to 85% on RA in sitting, so 4 L O@ [place] re-applied at end of session.       Modified Rankin (Stroke Patients Only) Modified Rankin (Stroke Patients Only) Pre-Morbid Rankin Score: No symptoms Modified Rankin: Moderately severe disability     Balance Overall balance assessment: Needs assistance Sitting-balance support: Feet supported;Bilateral upper extremity supported Sitting balance-Leahy Scale: Fair Sitting balance - Comments: Pt sat EOB ~5 mins.  Reports the lightheadedness/dizziness was getting worse, so returned to supine.                              Cognition Arousal/Alertness: Awake/alert Behavior During Therapy: WFL for tasks assessed/performed Overall Cognitive Status: Within Functional Limits for tasks assessed  Pertinent Vitals/Pain Pain Assessment: 0-10 Pain Score: 7  Pain Location: abdomen Pain Descriptors / Indicators: Aching;Burning;Grimacing;Guarding Pain Intervention(s): Limited activity within patient's tolerance;Monitored during session;Repositioned;PCA encouraged           PT Goals (current goals can now be found in the care plan section) Acute Rehab PT Goals PT Goal Formulation: With patient/family Time For Goal Achievement: 10/01/15 Potential to Achieve Goals: Fair Progress towards PT goals:  (re-eval  preformed, goals updated)    Frequency  Min 3X/week    PT Plan Current plan remains appropriate       End of Session Equipment Utilized During Treatment: Oxygen Activity Tolerance: Patient limited by fatigue;Patient limited by pain Patient left: in bed;with call bell/phone within reach;with family/visitor present     Time: [id number] PT Time Calculation (min) (ACUTE ONLY): 37 min  Charges:  $Therapeutic Activity: 8-22 mins                      Shirl Weir B. Khristy Kalan, PT, DPT [phone]   09/17/2015, 5:34 PM

## 2015-09-18 ENCOUNTER — Inpatient Hospital Stay (HOSPITAL_COMMUNITY): Payer: Medicaid Other

## 2015-09-18 DIAGNOSIS — D62 Acute posthemorrhagic anemia: Secondary | ICD-10-CM

## 2015-09-18 DIAGNOSIS — R000 Tachycardia, unspecified: Secondary | ICD-10-CM

## 2015-09-18 DIAGNOSIS — R339 Retention of urine, unspecified: Secondary | ICD-10-CM

## 2015-09-18 DIAGNOSIS — K068 Other specified disorders of gingiva and edentulous alveolar ridge: Secondary | ICD-10-CM | POA: Insufficient documentation

## 2015-09-18 LAB — CBC WITH DIFFERENTIAL/PLATELET
BASOS ABS: 0.7 10*3/uL — AB (ref 0.0–0.1)
BASOS PCT: 4 %
EOS PCT: 4 %
Eosinophils Absolute: 0.7 10*3/uL (ref 0.0–0.7)
HEMATOCRIT: 24.1 % — AB (ref 36.0–46.0)
HEMOGLOBIN: 7.9 g/dL — AB (ref 12.0–15.0)
LYMPHS ABS: 2.5 10*3/uL (ref 0.7–4.0)
Lymphocytes Relative: 15 %
MCH: 28.1 pg (ref 26.0–34.0)
MCHC: 32.8 g/dL (ref 30.0–36.0)
MCV: 85.8 fL (ref 78.0–100.0)
MONOS PCT: 18 %
Monocytes Absolute: 3 10*3/uL — ABNORMAL HIGH (ref 0.1–1.0)
NEUTROS ABS: 10 10*3/uL — AB (ref 1.7–7.7)
Neutrophils Relative %: 59 %
Platelets: <5 10*3/uL — CL (ref 150–400)
RBC: 2.81 MIL/uL — ABNORMAL LOW (ref 3.87–5.11)
RDW: 14.3 % (ref 11.5–15.5)
WBC: 16.9 10*3/uL — ABNORMAL HIGH (ref 4.0–10.5)

## 2015-09-18 LAB — PREPARE RBC (CROSSMATCH)

## 2015-09-18 MED ORDER — SODIUM CHLORIDE 0.9 % IV SOLN
Freq: Once | INTRAVENOUS | Status: AC
  Administered 2015-09-21: 500 mL via INTRAVENOUS

## 2015-09-18 MED ORDER — NORETHINDRONE ACETATE 5 MG PO TABS
5.0000 mg | ORAL_TABLET | Freq: Every day | ORAL | Status: DC
  Administered 2015-09-18 – 2015-09-22 (×5): 5 mg via ORAL
  Filled 2015-09-18 (×5): qty 1

## 2015-09-18 NOTE — Progress Notes (Signed)
Bladder scanned, in bladder. MD notified. Ordered foley catheter.

## 2015-09-18 NOTE — Progress Notes (Signed)
2 Days Post-Op  Subjective: AWAKE   Family at bedside   Objective: Vital signs in last 24 hours: Temp:  [98.4 F (36.9 C)-103.1 F (39.5 C)] 103.1 F (39.5 C) (07/01 0829) Pulse Rate:  [101-121] 121 (07/01 0829) Resp:  [9-19] 13 (07/01 0829) BP: (98-118)/(59-78) 110/72 mmHg (07/01 0829) SpO2:  [92 %-96 %] 94 % (07/01 0829) Arterial Line BP: (107-128)/(64-67) 108/64 mmHg (07/01 0002) Last BM Date: 09/15/15  Intake/Output from previous day: 06/30 0701 - 07/01 0700 In: 2925 [I.V.:2375; IV Piggyback:550] Out: 1500 [Urine:1500] Intake/Output this shift:    Incision/Wound:CDI no bleeding noted   Lab Results:   Recent Labs  09/17/15 0435 09/18/15 0459  WBC 11.7* 16.9*  HGB 9.7* 7.9*  HCT 29.7* 24.1*  PLT <5* <5*   BMET  Recent Labs  09/16/15 1137 09/17/15 0435  NA 142 138  K 3.6 3.7  CL  --  105  CO2  --  25  GLUCOSE  --  109*  BUN  --  <5*  CREATININE  --  0.65  CALCIUM  --  8.1*   PT/INR No results for input(s): LABPROT, INR in the last 72 hours. ABG  Recent Labs  09/16/15 1137  PHART 7.365  HCO3 21.7    Studies/Results: Dg Chest Port 1 View  09/16/2015  CLINICAL DATA:  eval for placement of right central line - post op splenectomy EXAM: PORTABLE CHEST 1 VIEW COMPARISON:  12/04/2010 FINDINGS: The heart is mildly enlarged. There is pulmonary vascular congestion. No overt alveolar edema is identified. Right IJ central line has been placed, tip overlying the level of superior vena cava. No evidence for pneumothorax. IMPRESSION: Interval placement of right IJ central line.  No pneumothorax. Pulmonary vascular congestion noted. Electronically Signed   By: Norva PavlovElizabeth  Brown M.D.   On: 09/16/2015 13:40    Anti-infectives: Anti-infectives    Start     Dose/Rate Route Frequency Ordered Stop   09/18/15 0000  vancomycin (VANCOCIN) IVPB 1000 mg/200 mL premix  Status:  Discontinued     1,000 mg 200 mL/hr over 60 Minutes Intravenous Every 8 hours 09/17/15 0906  09/17/15 1709   09/17/15 2300  ceFEPIme (MAXIPIME) 2 g in dextrose 5 % 50 mL IVPB     2 g 100 mL/hr over 30 Minutes Intravenous Every 12 hours 09/17/15 1724     09/17/15 1800  piperacillin-tazobactam (ZOSYN) IVPB 3.375 g  Status:  Discontinued     3.375 g 12.5 mL/hr over 240 Minutes Intravenous Every 8 hours 09/17/15 0906 09/17/15 1712   09/17/15 0900  piperacillin-tazobactam (ZOSYN) IVPB 3.375 g     3.375 g 100 mL/hr over 30 Minutes Intravenous STAT 09/17/15 0847 09/17/15 1207   09/17/15 0900  vancomycin (VANCOCIN) 2,000 mg in sodium chloride 0.9 % 500 mL IVPB     2,000 mg 250 mL/hr over 120 Minutes Intravenous  Once 09/17/15 0847 09/17/15 1337   09/16/15 0600  ceFAZolin (ANCEF) IVPB 2g/100 mL premix    Comments:  Pharmacy may adjust dosing strength, interval, or rate of medication as needed for optimal therapy for the patient  Send with patient on call to the OR.  Anesthesia to complete antibiotic administration <5960min prior to incision per [facility].   2 g 200 mL/hr over 30 Minutes Intravenous To Short Stay 09/15/15 1607 09/16/15 1025      Assessment/Plan: s/p Procedure(s): OPEN SPLENECTOMY (N/A) No signs of bleeding  Still thrombocytopenic  Fever on broad spectrum ABX  GINGIVAL BLEEDING  No signs  of intraabdominal bleeding Would leave in SDU  LOS: 21 days    Tanor Glaspy A. 09/18/2015

## 2015-09-18 NOTE — Progress Notes (Signed)
Patient was bladder scanned, volume . Patient stable, VS stable. RN will continue to monitor.

## 2015-09-18 NOTE — Progress Notes (Signed)
   Subjective: No new complaints this morning. She reports mucous in her throat. Pain controlled. Objective: Vital signs in last 24 hours: Filed Vitals:   09/18/15 0347 09/18/15 0400 09/18/15 0800 09/18/15 0829  BP: 117/70   110/72  Pulse: 101   121  Temp: 98.4 F (36.9 C)   103.1 F (39.5 C)  TempSrc: Oral   Axillary  Resp: 19 13 19 13   Height:      Weight:      SpO2: 94% 92% 96% 94%   Physical Exam General Apperance: NAD HEENT: Normocephalic, atraumatic, anicteric sclera Neck: Supple, trachea midline Lungs: Course rhonchi Heart: Regular rate and rhythm Abdomen: Soft, appropriately tender to palpation, nondistended Extremities: Warm and well perfused, no edema Skin: No rashes Neurologic: Alert and interactive. No gross deficits.  Assessment/Plan: 40 year old woman here with acute ITP. SAH on non contrasted CT head on admission.  Acute ITP: s/p 3 doses of Romiplostim. Underwent splenectomy 09/16/2015. Plt <5.  -Dr. Cyndie ChimeGranfortuna following, appreciate recommendations -Third dose of Rituxan tomorrow or Monday if she becomes afebrile -Continue Amicar 1g q4 hours PO and 4g IV q4hr -Dilaudid PCA  Acute blood loss anemia: Hgb 7.9.  -Ongoing mucosal bleeding. Crossmatch 4 units and transfuse 2 units of pRBC -Follow up post transfusion CBC  Fever: Continues to have fever to 103.1. Tachycardic to 121. BP remains stable. Leukocytosis to 16.9 with left shift. UA with many bacteria, small leukocytes, negative nitrite, 6-30 WBC. BCx NGTD. At risk for overwhelming post-splenectomy infections. -Portable CXR -Continue cefepime.  Urinary retention: Foley replaced on 6/30.  FEN:  -NPO -LR@125   FULL Code  Dispo: Anticipated discharge in approximately 3-4 day(s).    LOS: 21 days   Lora PaulaJennifer T Krall, MD 09/18/2015, 10:29 AM Pager: [phone]

## 2015-09-18 NOTE — Progress Notes (Addendum)
Bladder scan of patient show 427ml. Patient requesting to have foley placed rather than in&out d/t trauma and bleeding from foley being removed previously and her condition (dizziness, bleeding risk, and extensive length of stay). Request made to MD. MD ordered to hold off on foley and in&out and to do another bladder scan in 2 hours and notify MD if >56100ml. RN will continue to monitor.

## 2015-09-18 NOTE — Progress Notes (Signed)
Patient ID: [name], female   DOB: 08/29/1975, 40 y.o.   MRN: [id number] Hematology: Problems overnight with urinary retention after Foley catheter was removed yesterday. Foley had to be reinserted. Urine blood-tinged. Ongoing oropharyngeal bleeding. No oozing of blood from invasive catheters right IJ line, left arterial line. Abdominal wound dry. Now afebrile on cefapime She is alert and oriented. Denies headache. Lungs overall clear. Increased upper airway noise over trachea from bloody oral secretions. No focal neuro deficits. Pupils equal round reactive to light. Full extraocular movements. Motor strength 5 over 5. Lab: Hemoglobin down from 9.7 to 7.9 Platelets undetectable White count up 16,900, 59% neutrophils Review of peripheral blood film: Multiple 50 X fields reviewed and I did not see any platelets. There is now a large population of nucleated red blood cells. Impression: Refractory ITP day 2 post splenectomy Signs of intense marrow stress with appearance of nucleated red blood cells but still no platelet response. She has received maximum medical and surgical therapy. The best we can do at this point is supportive care in anticipation of a response. I would transfuse 2 units of packed red cells today so she has a margin of safety in view of continued bleeding. If arterial line is removed, please make sure that pressure is applied to the wound site for at least 20 minutes. If she remains afebrile, I will proceed with third planned dose of Rituxan tomorrow or Monday. She has already had 3 escalating doses of Romiplostim.   Patient's husband here. Status reviewed. Daughter translated.     Cephas DarbyJames Patirica Longshore, MD, FACP  Hematology-Oncology/Internal Medicine [phone]

## 2015-09-19 DIAGNOSIS — L8990 Pressure ulcer of unspecified site, unspecified stage: Secondary | ICD-10-CM | POA: Insufficient documentation

## 2015-09-19 DIAGNOSIS — Z9889 Other specified postprocedural states: Secondary | ICD-10-CM

## 2015-09-19 LAB — TYPE AND SCREEN
ABO/RH(D): O POS
ANTIBODY SCREEN: NEGATIVE
UNIT DIVISION: 0
UNIT DIVISION: 0
UNIT DIVISION: 0
UNIT DIVISION: 0
Unit division: 0
Unit division: 0
Unit division: 0
Unit division: 0

## 2015-09-19 LAB — BASIC METABOLIC PANEL
ANION GAP: 6 (ref 5–15)
BUN: 6 mg/dL (ref 6–20)
CALCIUM: 8.1 mg/dL — AB (ref 8.9–10.3)
CO2: 27 mmol/L (ref 22–32)
Chloride: 106 mmol/L (ref 101–111)
Creatinine, Ser: 0.61 mg/dL (ref 0.44–1.00)
Glucose, Bld: 115 mg/dL — ABNORMAL HIGH (ref 65–99)
Potassium: 3.3 mmol/L — ABNORMAL LOW (ref 3.5–5.1)
Sodium: 139 mmol/L (ref 135–145)

## 2015-09-19 LAB — URINE CULTURE: CULTURE: NO GROWTH

## 2015-09-19 LAB — CBC WITH DIFFERENTIAL/PLATELET
BASOS PCT: 6 %
Basophils Absolute: 0.8 10*3/uL — ABNORMAL HIGH (ref 0.0–0.1)
Basophils Absolute: 0.5 10*3/uL — ABNORMAL HIGH (ref 0.0–0.1)
Basophils Relative: 3 %
EOS ABS: 0.5 10*3/uL (ref 0.0–0.7)
EOS PCT: 2 %
EOS PCT: 3 %
Eosinophils Absolute: 0.3 10*3/uL (ref 0.0–0.7)
HCT: 32.9 % — ABNORMAL LOW (ref 36.0–46.0)
HCT: 26.8 % — ABNORMAL LOW (ref 36.0–46.0)
Hemoglobin: 10.8 g/dL — ABNORMAL LOW (ref 12.0–15.0)
Hemoglobin: 9 g/dL — ABNORMAL LOW (ref 12.0–15.0)
LYMPHS ABS: 0.8 10*3/uL (ref 0.7–4.0)
LYMPHS PCT: 9 %
Lymphocytes Relative: 6 %
Lymphs Abs: 1.6 10*3/uL (ref 0.7–4.0)
MCH: 28.7 pg (ref 26.0–34.0)
MCH: 28.6 pg (ref 26.0–34.0)
MCHC: 32.8 g/dL (ref 30.0–36.0)
MCHC: 33.6 g/dL (ref 30.0–36.0)
MCV: 87.5 fL (ref 78.0–100.0)
MCV: 85.1 fL (ref 78.0–100.0)
MONO ABS: 2.3 10*3/uL — AB (ref 0.1–1.0)
MONO ABS: 2.3 10*3/uL — AB (ref 0.1–1.0)
Monocytes Relative: 16 %
Monocytes Relative: 13 %
NEUTROS PCT: 72 %
Neutro Abs: 9.9 10*3/uL — ABNORMAL HIGH (ref 1.7–7.7)
Neutro Abs: 12.5 10*3/uL — ABNORMAL HIGH (ref 1.7–7.7)
Neutrophils Relative %: 70 %
RBC: 3.76 MIL/uL — AB (ref 3.87–5.11)
RBC: 3.15 MIL/uL — AB (ref 3.87–5.11)
RDW: 14 % (ref 11.5–15.5)
RDW: 14.1 % (ref 11.5–15.5)
WBC Morphology: INCREASED
WBC: 14.1 10*3/uL — AB (ref 4.0–10.5)
WBC: 17.4 10*3/uL — AB (ref 4.0–10.5)

## 2015-09-19 MED ORDER — PROMETHAZINE HCL 25 MG PO TABS
25.0000 mg | ORAL_TABLET | Freq: Four times a day (QID) | ORAL | Status: DC | PRN
  Administered 2015-09-19: 25 mg via ORAL
  Filled 2015-09-19: qty 1

## 2015-09-19 MED ORDER — POTASSIUM CHLORIDE CRYS ER 20 MEQ PO TBCR
40.0000 meq | EXTENDED_RELEASE_TABLET | Freq: Once | ORAL | Status: DC
  Filled 2015-09-19: qty 2

## 2015-09-19 MED ORDER — POTASSIUM CHLORIDE 10 MEQ/50ML IV SOLN
INTRAVENOUS | Status: AC
  Filled 2015-09-19: qty 200

## 2015-09-19 MED ORDER — ACETAMINOPHEN 160 MG/5ML PO SOLN
650.0000 mg | Freq: Once | ORAL | Status: AC
  Administered 2015-09-20: 650 mg via ORAL
  Filled 2015-09-19: qty 20.3

## 2015-09-19 MED ORDER — POTASSIUM CHLORIDE 10 MEQ/100ML IV SOLN
10.0000 meq | INTRAVENOUS | Status: DC
  Filled 2015-09-19 (×4): qty 100

## 2015-09-19 MED ORDER — SODIUM CHLORIDE 0.9 % IV SOLN: 375.0000 mg/m2 | Freq: Once | INTRAVENOUS | Status: DC

## 2015-09-19 MED ORDER — POTASSIUM CHLORIDE 10 MEQ/50ML IV SOLN
10.0000 meq | INTRAVENOUS | Status: AC
  Administered 2015-09-19 (×4): 10 meq via INTRAVENOUS

## 2015-09-19 MED ORDER — DIPHENHYDRAMINE HCL 50 MG/ML IJ SOLN
25.0000 mg | Freq: Once | INTRAMUSCULAR | Status: AC
  Administered 2015-09-20: 25 mg via INTRAVENOUS
  Filled 2015-09-19: qty 1

## 2015-09-19 MED ORDER — LORAZEPAM 2 MG/ML IJ SOLN: 0.5000 mg | Freq: Once | INTRAMUSCULAR | Status: DC

## 2015-09-19 NOTE — Progress Notes (Signed)
Patient ID: [name], female   DOB: 04/05/1975, 40 y.o.   MRN: [id number]  [facility]  Subjective: POD#3 - patient in bed, family and nursing at bedside.  No abdominal complaints.  Wants to eat.  Objective: Vital signs in last 24 hours: Temp:  [97.8 F (36.6 C)-101.2 F (38.4 C)] 99.5 F (37.5 C) (07/02 0740) Pulse Rate:  [72-113] 72 (07/02 0740) Resp:  [10-21] 12 (07/02 0800) BP: (100-126)/(58-83) 112/80 mmHg (07/02 0740) SpO2:  [90 %-99 %] 95 % (07/02 0800) Arterial Line BP: (120-130)/(70-78) 130/75 mmHg (07/01 2344) Last BM Date: 09/18/15  Intake/Output from previous day: 07/01 0701 - 07/02 0700 In: 4320 [I.V.:3550; Blood:620; IV Piggyback:150] Out: 2050 [Urine:2050] Intake/Output this shift: Total I/O In: 0  Out: 125 [Urine:125]  Physical Exam: HEENT - sclerae clear, mucous membranes moist Neck - soft Abdomen - obese; LUQ wound dry and intact with honeycomb dressing; BS present; minimal tenderness Neuro - alert & oriented, no focal deficits  Lab Results:   Recent Labs  09/18/15 2220 09/19/15 0540  WBC 14.1* 17.4*  HGB 10.8* 9.0*  HCT 32.9* 26.8*  PLT <5* <5*   BMET  Recent Labs  09/17/15 0435 09/19/15 0540  NA 138 139  K 3.7 3.3*  CL 105 106  CO2 25 27  GLUCOSE 109* 115*  BUN <5* 6  CREATININE 0.65 0.61  CALCIUM 8.1* 8.1*   PT/INR No results for input(s): LABPROT, INR in the last 72 hours. Comprehensive Metabolic Panel:    Component Value Date/Time   NA 139 09/19/2015 0540   NA 138 09/17/2015 0435   K 3.3* 09/19/2015 0540   K 3.7 09/17/2015 0435   CL 106 09/19/2015 0540   CL 105 09/17/2015 0435   CO2 27 09/19/2015 0540   CO2 25 09/17/2015 0435   BUN 6 09/19/2015 0540   BUN <5* 09/17/2015 0435   CREATININE 0.61 09/19/2015 0540   CREATININE 0.65 09/17/2015 0435   GLUCOSE 115* 09/19/2015 0540   GLUCOSE 109* 09/17/2015 0435   CALCIUM 8.1* 09/19/2015 0540   CALCIUM 8.1* 09/17/2015 0435    AST 19 09/05/2015 0538   AST 20 08/28/2015 1245   ALT 27 09/05/2015 0538   ALT 21 08/28/2015 1245   ALKPHOS 49 09/05/2015 0538   ALKPHOS 47 08/28/2015 1245   BILITOT 0.2* 09/05/2015 0538   BILITOT 0.7 08/28/2015 1245   PROT 8.4* 09/05/2015 0538   PROT 7.0 08/28/2015 1245   ALBUMIN 2.8* 09/05/2015 0538   ALBUMIN 3.9 08/28/2015 1245    Studies/Results: Dg Chest Port 1 View  09/18/2015  CLINICAL DATA:  Fever EXAM: PORTABLE CHEST 1 VIEW COMPARISON:  09/16/2015 chest radiograph. FINDINGS: Right internal jugular central venous catheter terminates in the lower third of the superior vena cava. Stable cardiomediastinal silhouette with top-normal heart size. No pneumothorax. No pleural effusion. Low lung volumes. Platelike atelectasis in the left mid lung. Faint patchy opacities at the left lung base. IMPRESSION: 1. Faint patchy left lung base opacities, cannot exclude a developing pneumonia. 2. Platelike atelectasis in the left mid lung.  Low lung volumes. Electronically Signed   By: Delbert PhenixJason A Poff M.D.   On: 09/18/2015 13:25    Assessment & Plans: Status post OPEN SPLENECTOMY  No signs of bleeding   Profoundly thrombocytopenic   No signs of intra-peritoneal bleeding  Begin clear liquid diet  Per medical service  Will follow with you  Velora Hecklerodd M. Amarisa Wilinski, MD, [facility] Office: [phone]  Anevay Campanella Judie PetitM 09/19/2015

## 2015-09-19 NOTE — Progress Notes (Signed)
Potassium replacement given to patient, had 1 episode of vomiting clear emesis. Zofran given. Md called about switching to IV K+ replacement.

## 2015-09-19 NOTE — Progress Notes (Signed)
Subjective: Patient is doing well this morning. Complained of headache overnight relieved with PRNs. Otherwise feeling well.   Objective: Vital signs in last 24 hours: Filed Vitals:   09/18/15 2344 09/19/15 0000 09/19/15 0400 09/19/15 0531  BP: 123/67   100/64  Pulse: 104   74  Temp: 100.9 F (38.3 C)   97.8 F (36.6 C)  TempSrc: Axillary   Axillary  Resp: _0 Height:      Weight:      SpO2: 97% 98%  99%    Labs:  Ref. Range 09/19/2015 05:40  Sodium Latest Ref Range: 135-145 mmol/L 139  Potassium Latest Ref Range: 3.5-5.1 mmol/L 3.3 (L)  Chloride Latest Ref Range: 101-111 mmol/L 106  CO2 Latest Ref Range: 22-32 mmol/L 27  BUN Latest Ref Range: 6-20 mg/dL 6  Creatinine Latest Ref Range: 0.44-1.00 mg/dL 0.61  Calcium Latest Ref Range: 8.9-10.3 mg/dL 8.1 (L)  EGFR (Non-African Amer.) Latest Ref Range: >60 mL/min >60  EGFR (African American) Latest Ref Range: >60 mL/min >60  Glucose Latest Ref Range: 65-99 mg/dL 115 (H)  Anion gap Latest Ref Range: 5-15  6  WBC Latest Ref Range: 4.0-10.5 K/uL 17.4 (H)  RBC Latest Ref Range: 3.87-5.11 MIL/uL 3.15 (L)  Hemoglobin Latest Ref Range: 12.0-15.0 g/dL 9.0 (L)  HCT Latest Ref Range: 36.0-46.0 % 26.8 (L)  MCV Latest Ref Range: 78.0-100.0 fL 85.1  MCH Latest Ref Range: 26.0-34.0 pg 28.6  MCHC Latest Ref Range: 30.0-36.0 g/dL 33.6  RDW Latest Ref Range: 11.5-15.5 % 14.1  Platelets Latest Ref Range: 150-400 K/uL <5 (LL)  Neutrophils Latest Units: % 72  Lymphocytes Latest Units: % 9  Monocytes Relative Latest Units: % 13  Eosinophil Latest Units: % 3  Basophil Latest Units: % 3  NEUT# Latest Ref Range: 1.7-7.7 K/uL 12.5 (H)  Lymphocyte # Latest Ref Range: 0.7-4.0 K/uL 1.6  Monocyte # Latest Ref Range: 0.1-1.0 K/uL 2.3 (H)  Eosinophils Absolute Latest Ref Range: 0.0-0.7 K/uL 0.5  Basophils Absolute Latest Ref Range: 0.0-0.1 K/uL 0.5 (H)  RBC Morphology Unknown RARE NRBCs  WBC Morphology Unknown MODERATE LEFT SHI.Marland KitchenMarland Kitchen    Imaging: 09/18/15 Chest Port 1 View: IMPRESSION: 1. Faint patchy left lung base opacities, cannot exclude a developing pneumonia. 2. Platelike atelectasis in the left mid lung. Low lung volumes.  Physical Exam Constitutional: NAD HEENT: Normocephalic, atraumatic, +ginvival bleeding Cardiovascular: RRR, no m/r/g Pulmonary/Chest: CTAB GU: Foley in place, +hematuria  Extremities: distal pulses intact bilaterally  Skin: petechial rash on face Psychiatric: mood and affect normal   Assessment/Plan: Principal Problem:   Acute ITP (HCC) Active Problems:   Chronic hepatitis B (HCC)   Nontraumatic subarachnoid hemorrhage (HCC)   Severe thrombocytopenia (HCC)   Gingival bleeding  Severe thrombocytopenia 2/2 ITP: Platelet count remains undetectable today. S/p IVIG x 6/10-11 and 6/13 and decadron 6/10 -13. Initially with CT showing punctate hemorrhages, with repeat CT head wo contrast 6/21 negative for bleeding. She has ongoing mucosal bleeds with a mild downward trend in Hgb. 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 - Rituximab held 6/30 in setting of fever. Restart today or Monday per recs - s/p 3 escalating doses of Romiplostim  - Continue amicar - CBC daily - Dilaudid PCA  - Continue NPO and maintenance fluids s/p abdominal surgery with gastric vessel involvement POD 3 - Surgery and heme following, appreciate recs  Anemia: Hgb 9.0, up from 7.9 yesterday  s/p 2u pRBCs - Continue to monitor  Fever: afebrile this morning, tachycardia resolved, BP stable. Leukocytosis to 17.4 with left shift. Positive UA with many bacteria, small leukocytes, negative nitrite, 6-30 WBC. BCCx NGTD. CXR 09/18/15 with patchy opacities, likely 2/2 to atelectasis.  - Continue cefepime, D3  Urinary Retention: - Foley in place with good UOP, +hematuria  - Continue to monitor   FEN: - NPO - LR @ 125  Dispo:  Anticipated discharge in approximately 3-4 day(s).   LOS: 22 days   Carolyn Guilloud, MD 09/19/2015, 7:11 AM Pager: [phone] 

## 2015-09-19 NOTE — Progress Notes (Signed)
Patient ID: [name], female   DOB: 11/20/1975, 40 y.o.   MRN: [id number] Hematology: Stable overnight. Transient loss of sensation lower extremities. Mild headache relieved by tylenol. Less oral and vaginal bleeding. T max 100.9 axillary. Blood cultures from 6/30 negative Exam: Awake, alert Organized clot lower gum line. New right subconjunctival hemorrhage. PERRL. Full EOMs. Motor 5/5 all extrems. Lungs clear. Lab: Platelets remain undetectable. 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 Continue current level of support. Dose #3 of 4 Rituxan in AM 7/3. Reviewed management with housestaff.

## 2015-09-20 ENCOUNTER — Inpatient Hospital Stay (HOSPITAL_COMMUNITY): Payer: Medicaid Other

## 2015-09-20 DIAGNOSIS — I629 Nontraumatic intracranial hemorrhage, unspecified: Secondary | ICD-10-CM

## 2015-09-20 DIAGNOSIS — N920 Excessive and frequent menstruation with regular cycle: Secondary | ICD-10-CM | POA: Insufficient documentation

## 2015-09-20 LAB — ABO/RH: ABO/RH(D): O POS

## 2015-09-20 LAB — CBC WITH DIFFERENTIAL/PLATELET
Basophils Absolute: 0.7 10*3/uL — ABNORMAL HIGH (ref 0.0–0.1)
Basophils Relative: 3 %
Eosinophils Absolute: 0.9 10*3/uL — ABNORMAL HIGH (ref 0.0–0.7)
Eosinophils Relative: 4 %
HEMATOCRIT: 24.6 % — AB (ref 36.0–46.0)
HEMOGLOBIN: 8.1 g/dL — AB (ref 12.0–15.0)
LYMPHS ABS: 1.9 10*3/uL (ref 0.7–4.0)
LYMPHS PCT: 8 %
MCH: 28.1 pg (ref 26.0–34.0)
MCHC: 32.9 g/dL (ref 30.0–36.0)
MCV: 85.4 fL (ref 78.0–100.0)
MONOS PCT: 10 %
Monocytes Absolute: 2.4 10*3/uL — ABNORMAL HIGH (ref 0.1–1.0)
NEUTROS ABS: 17.9 10*3/uL — AB (ref 1.7–7.7)
NEUTROS PCT: 75 %
Platelets: <5 10*3/uL — CL (ref 150–400)
RBC: 2.88 MIL/uL — AB (ref 3.87–5.11)
RDW: 14.1 % (ref 11.5–15.5)
WBC: 23.8 10*3/uL — AB (ref 4.0–10.5)

## 2015-09-20 LAB — BASIC METABOLIC PANEL
Anion gap: 17 — ABNORMAL HIGH (ref 5–15)
CHLORIDE: 95 mmol/L — AB (ref 101–111)
CO2: 27 mmol/L (ref 22–32)
CREATININE: 0.5 mg/dL (ref 0.44–1.00)
Calcium: 9 mg/dL (ref 8.9–10.3)
GFR calc Af Amer: >60 mL/min (ref >60)
GFR calc non Af Amer: >60 mL/min (ref >60)
GLUCOSE: 122 mg/dL — AB (ref 65–99)
POTASSIUM: 3.8 mmol/L (ref 3.5–5.1)
SODIUM: 139 mmol/L (ref 135–145)

## 2015-09-20 MED ORDER — ACETAMINOPHEN 500 MG PO TABS: 1000.0000 mg | ORAL_TABLET | Freq: Four times a day (QID) | ORAL | Status: DC | PRN

## 2015-09-20 MED ORDER — ACETAMINOPHEN 325 MG PO TABS
325.0000 mg | ORAL_TABLET | Freq: Once | ORAL | Status: AC
Start: 2015-09-20 — End: 2015-09-20
  Administered 2015-09-20: 325 mg via ORAL
  Filled 2015-09-20: qty 1

## 2015-09-20 MED ORDER — SODIUM CHLORIDE 0.9 % IV SOLN
375.0000 mg/m2 | Freq: Once | INTRAVENOUS | Status: AC
  Administered 2015-09-20: 800 mg via INTRAVENOUS
  Filled 2015-09-20: qty 80

## 2015-09-20 MED ORDER — SODIUM CHLORIDE 0.9 % IV SOLN
Freq: Once | INTRAVENOUS | Status: AC
  Administered 2015-09-20: 500 mL via INTRAVENOUS

## 2015-09-20 MED ORDER — LIDOCAINE HCL 2 % EX GEL
1.0000 "application " | Freq: Once | CUTANEOUS | Status: AC
  Administered 2015-09-20: 1 via URETHRAL
  Filled 2015-09-20: qty 5

## 2015-09-20 MED ORDER — DEXAMETHASONE SODIUM PHOSPHATE 10 MG/ML IJ SOLN
10.0000 mg | Freq: Once | INTRAMUSCULAR | Status: AC
  Administered 2015-09-20: 10 mg via INTRAVENOUS
  Filled 2015-09-20: qty 1

## 2015-09-20 MED ORDER — COAGULATION FACTOR VIIA RECOMB 1 MG IV SOLR
60.0000 ug/kg | Freq: Once | INTRAVENOUS | Status: AC
  Administered 2015-09-20: 6000 ug via INTRAVENOUS
  Filled 2015-09-20: qty 6

## 2015-09-20 MED ORDER — TRAMADOL HCL 50 MG PO TABS: 50.0000 mg | ORAL_TABLET | Freq: Four times a day (QID) | ORAL | Status: DC | PRN

## 2015-09-20 MED ORDER — SODIUM CHLORIDE 0.9 % IV SOLN
Freq: Once | INTRAVENOUS | Status: AC
  Administered 2015-09-20: 23:00:00 via INTRAVENOUS

## 2015-09-20 MED ORDER — DEXAMETHASONE SODIUM PHOSPHATE 4 MG/ML IJ SOLN
4.0000 mg | Freq: Four times a day (QID) | INTRAMUSCULAR | Status: DC
  Administered 2015-09-21 – 2015-09-22 (×7): 4 mg via INTRAVENOUS
  Filled 2015-09-20 (×7): qty 1

## 2015-09-20 MED ORDER — PROTHROMBIN COMPLEX CONC HUMAN 500 UNITS IV KIT
5000.0000 [IU] | PACK | Freq: Once | Status: AC
  Administered 2015-09-20: 5000 [IU] via INTRAVENOUS
  Filled 2015-09-20: qty 200

## 2015-09-20 MED ORDER — ACETAMINOPHEN 500 MG PO TABS
500.0000 mg | ORAL_TABLET | Freq: Four times a day (QID) | ORAL | Status: DC | PRN
  Administered 2015-09-21: 500 mg via ORAL
  Filled 2015-09-20: qty 1

## 2015-09-20 MED ORDER — TRAMADOL HCL 50 MG PO TABS
50.0000 mg | ORAL_TABLET | Freq: Four times a day (QID) | ORAL | Status: DC | PRN
  Administered 2015-09-20 – 2015-09-22 (×2): 50 mg via ORAL
  Filled 2015-09-20 (×2): qty 1

## 2015-09-20 NOTE — Progress Notes (Signed)
Rituxan infusion complete.  Pt tolerated infusion well, VSS throughout infusion.  Flushed line with 100ccNormal Saline.

## 2015-09-20 NOTE — Progress Notes (Addendum)
Went to check on patient. She is having worsening headache 9/10 now, also having ringing in both ears since yesterday but worse now, and dizziness which is also now worse.  Neuro exam is unchanged from prior, normal pupil exam, has subconjunctival hemorrhage on right eye. She appears very sleepy, going in and out of sleep during the interview.   Her dizziness can be the result of pain medications. Given her platelet count is <5 from her ITP, there is serious concern about intracranial hemorrhage. However, since she does not have any focal neuro deficit, we will start out with non contrast head CT first. If it shows bleed then we will give her Kcentra, if negative then we will continue to monitor.  Discussed the plan with Dr. Josem KaufmannKlima and patient's nurse.

## 2015-09-20 NOTE — Progress Notes (Signed)
Pharmacy Antibiotic Note  [name] is a 40 y.o. female admitted on 08/28/2015 and is being treated for refractory ITP, s/p splenectomy on 09/16/15.  Pharmacy has been consulted for cefepime dosing for sepsis.  Patient's renal function has been stable.   Plan: - Continue cefepime 2gm IV Q12H - Monitor renal function, clinical progress   Height: 5\' 1"  (154.9 cm) Weight: 234 lb (106.142 kg) IBW/kg (Calculated) : 47.8  Temp (24hrs), Avg:98.5 F (36.9 C), Min:98.2 F (36.8 C), Max:98.8 F (37.1 C)   Recent Labs Lab 09/15/15 0530  09/17/15 0435 09/18/15 0459 09/18/15 2220 09/19/15 0540 09/20/15 0500  WBC 9.9  < > 11.7* 16.9* 14.1* 17.4* 23.8*  CREATININE 0.76  --  0.65  --   --  0.61 0.50  < > = values in this interval not displayed.  Estimated Creatinine Clearance: 104.9 mL/min (by C-G formula based on Cr of 0.5).    No Known Allergies   Antimicrobials this admission: Cefazolin 6/29 pre-op Vancomycin 6/30 >> 6/30 Zosyn 6/30 >> 6/30 Cefepime 6/30 >>  Microbiology results: 6/10 MRSA PCR - negative 6/30 BCx x2 - NGTD (BCID negative) 7/1 UCx - negative   Alexi Geibel D. Laney Potashang, PharmD, BCPS Pager:  [phone] 09/20/2015, 10:33 AM

## 2015-09-20 NOTE — Progress Notes (Signed)
PT Cancellation Note  Patient Details Name: [name] MRN: [id number] DOB: 06/13/1975   Cancelled Treatment:    Reason Eval/Treat Not Completed: Patient not medically ready.  Pt seems medically worse today, new hemorrhage on CT, continued bleeding, continued platelets <5, and I spoke with RN about the fact that she did not tolerate EOB very well on Friday 6/30 (significant lightheadedness EOB) and she seems medically worse today.  RN ok with PT holding and checking back tomorrow.    Thanks,    Rollene Rotundaebecca B. Jaqueline Uber, PT, DPT [phone]   09/20/2015, 10:45 AM

## 2015-09-20 NOTE — Progress Notes (Signed)
Subjective: Complained of worsening HA, dizziness, and tinnitus overnight. Night team called to bedside and reported no focal neurological deficits. CT head showed new small SAH and patient was given Kcentra. This morning patient continues to experience headaches. Gingival bleeding improved.   Objective: Vital signs in last 24 hours: Filed Vitals:   09/20/15 0959 09/20/15 1029 09/20/15 1100 09/20/15 1111  BP: 90/63 96/58 102/61   Pulse: 64 55 50   Temp: 98.2 F (36.8 C) 98.2 F (36.8 C) 98.2 F (36.8 C) 98.2 F (36.8 C)  TempSrc: Oral Oral Oral Oral  Resp: '16 16 16 16  ' Height:      Weight:      SpO2: 99% 100% 98%    Labs:   Ref. Range 09/20/2015 05:00  Sodium Latest Ref Range: 135-145 mmol/L 139  Potassium Latest Ref Range: 3.5-5.1 mmol/L 3.8  Chloride Latest Ref Range: 101-111 mmol/L 95 (L)  CO2 Latest Ref Range: 22-32 mmol/L 27  BUN Latest Ref Range: 6-20 mg/dL <5 (L)  Creatinine Latest Ref Range: 0.44-1.00 mg/dL 0.50  Calcium Latest Ref Range: 8.9-10.3 mg/dL 9.0  EGFR (Non-African Amer.) Latest Ref Range: >60 mL/min >60  EGFR (African American) Latest Ref Range: >60 mL/min >60  Glucose Latest Ref Range: 65-99 mg/dL 122 (H)  Anion gap Latest Ref Range: 5-15  17 (H)  WBC Latest Ref Range: 4.0-10.5 K/uL 23.8 (H)  RBC Latest Ref Range: 3.87-5.11 MIL/uL 2.88 (L)  Hemoglobin Latest Ref Range: 12.0-15.0 g/dL 8.1 (L)  HCT Latest Ref Range: 36.0-46.0 % 24.6 (L)  MCV Latest Ref Range: 78.0-100.0 fL 85.4  MCH Latest Ref Range: 26.0-34.0 pg 28.1  MCHC Latest Ref Range: 30.0-36.0 g/dL 32.9  RDW Latest Ref Range: 11.5-15.5 % 14.1  Platelets Latest Ref Range: 150-400 K/uL <5 (LL)  Neutrophils Latest Units: % 75  Lymphocytes Latest Units: % 8  Monocytes Relative Latest Units: % 10  Eosinophil Latest Units: % 4  Basophil Latest Units: % 3  NEUT# Latest Ref Range: 1.7-7.7 K/uL 17.9 (H)  Lymphocyte # Latest Ref Range: 0.7-4.0 K/uL 1.9  Monocyte # Latest Ref Range: 0.1-1.0 K/uL 2.4  (H)  Eosinophils Absolute Latest Ref Range: 0.0-0.7 K/uL 0.9 (H)  Basophils Absolute Latest Ref Range: 0.0-0.1 K/uL 0.7 (H)    Physical Exam Constitutional: NAD HEENT: Normocephalic, atraumatic, +ginvival bleeding Cardiovascular: RRR, no m/r/g Pulmonary/Chest: CTAB GU: Foley in place, +hematuria  Extremities: distal pulses intact bilaterally  Skin: petechial rash on face Psychiatric: mood and affect normal   Assessment/Plan:  Severe thrombocytopenia 2/2 ITP: Platelet count remains undetectable today. S/p IVIG x 6/10-11 and 6/13 and decadron 6/10 -13. Initially with CT showing punctate hemorrhages, with repeat CT head wo contrast 6/21 negative for bleeding. She has ongoing mucosal bleeds with a mild downward trend in Hgb. 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 Overnight experienced worsening HA, dizziness, and tinnitus. Repeat CT with new small SAH. Given Kcentra overnight.  - Rituximab held 6/30 in setting of fever. Restarted today. Continue to monitor platelet count. - s/p 3 escalating doses of Romiplostim  - Continue amicar - CBC daily - Dilaudid PCA  - Surgery and heme following, appreciate recs  Anemia: - Hgb 8.1 today, last transfused 2u pRBCs on 7/1 - Continue to monitor  Fever: afebrile this morning, tachycardia resolved, BP stable. Leukocytosis 23 this morning from 17.4 yesterday with left shift. Positive UA on 6/30 with many bacteria, small leukocytes, negative nitrite,  6-30 WBC. BCCx NGTD. CXR 09/18/15 with patchy opacities, likely 2/2 to atelectasis.  - Discontinue cefepime, continue to monitor  Urinary Retention: - Foley in place with good UOP, +hematuria  - Continue to monitor   FULL code  Dispo: Anticipated discharge in approximately 4-5 day(s).   LOS: 23 days   Velna Ochs, MD 09/20/2015, 12:08 PM Pager: [phone]

## 2015-09-20 NOTE — Progress Notes (Signed)
Paged dr Antony ContrasGuilloud, regarding pt c/o foley catheter, dr Antony ContrasGuilloud wants us to irrigate foley and call back. Irrigated foley with 50cc of NS. No resistance when removed 60cc of bloody urine with sediments. repaged with results will continue to monitor.

## 2015-09-20 NOTE — Progress Notes (Signed)
Paged MD on call, pt c/o severe pressure in head, states feels like her head is to explode. Pupils +3 reacting, c/o blurred vision, and still having tinnitus. Morphine 2mg  iv given. MD on call will come and assess pt. Will continue to monitor.

## 2015-09-20 NOTE — Clinical Social Work Note (Signed)
CSW provided patient's daughter, Bridget Hartshornerla, with four meal vouchers that are good through 7/6. This request was made by Ashby DawesGraciela, interpreter.   CSW signing off as there is no further need for social work intervention. Consult again if social work needs arise.  Charlynn CourtSarah Elton Catalano, CSW [phone]

## 2015-09-20 NOTE — Progress Notes (Signed)
Patient ID: [name], female   DOB: 10/18/1975, 40 y.o.   MRN: [id number] Hematology: Events of last PM & CT brain reviewed. Increase headache and vertigo. CT shows new, small are of subarachnoid bleeding. K-centra clotting concentrate given. Persistent symptoms but no focal deficits on exam. Awake, alert, oriented, full EOMs, PERRL, motor 5/5 Decreased oral bleeding. Still no rise in platelets. Hb 8.1 WBC rising - no change in diff Impression: 1.Refractory ITP Hospital d23 , d 4 post splenectomy 2. Subarachnoid bleed due to #1.  No guidelines on repetitive dosing. Maximum additional dose recommended is another 25 mg/kg if bleeding has extended on repeat CT.  Rec: I am going to push ahead with dose 3 Rituxan today, dose 4 of N-Plate on Wed if no response by then. Situation remains unstable.

## 2015-09-20 NOTE — Progress Notes (Signed)
Pt's family member started that pt was having worseing headache and ringing in the ears. 9/10 Pain. Pt family also said that pt was feeling weaker and was more lethargic. MD paged and returned page and made aware of concerns. Will continue to monitor pt.

## 2015-09-20 NOTE — Progress Notes (Signed)
Patient ID: [name], female   DOB: 08/05/1975, 40 y.o.   MRN: [id number] Hematology:  3rd CT done this evening when pt c/o increased headache shows new,areas of hemorrhage. All these areas are small. There may be early "mild local mass effect/edema".  Patient examined together with medical residents. Patient awake, alert, oriented. States headache better than before. Persistent tinnitus. No focal deficit on exam. PERRL, full EOMs, motor 5/5, upper body coordination normal Impression: Slow oozing of intracranial bleed from refractory thrombocytopenia Plan: Begin decadron 10 mg stat then 4 mg Q 6hrs Platelet transfusion Q 6 hours x 4 then re-evaluate Give one dose of recombinant VIIa 60 units/kgimmediately after platelet transfusion in attempt to achieve immediate hemostasis. Multiple family members here. Plan discussed. I also discussed the probable need to add chemotherapy soon.

## 2015-09-20 NOTE — Progress Notes (Signed)
[facility] Progress Note  4 Days Post-Op  Subjective:  Laying in bed in NAD. Graciela in the room translating. Pt just returned from follow-up CT of head for subarachnoid bleed s/p k-centra. Pt denies abdominal pain. Reports HA, fatigue, and continued gingival bleeding. Reports one episode of vomiting a small amount of liquid yesterday, has otherwise tolerated clear liquids. +flatus and BM. Denies bleeding from splenectomy incision site  Objective: Vital signs in last 24 hours: Temp:  [98.6 F (37 C)-98.8 F (37.1 C)] 98.6 F (37 C) (07/03 0346) Pulse Rate:  [53-72] 53 (07/03 0800) Resp:  [10-17] 12 (07/03 0800) BP: (95-110)/(55-70) 99/59 mmHg (07/03 0800) SpO2:  [90 %-100 %] 100 % (07/03 0800) Last BM Date: 09/18/15  Intake/Output from previous day: 07/02 0701 - 07/03 0700 In: 3407.5 [P.O.:1440; I.V.:1817.5; IV Piggyback:150] Out: 1425 [Urine:1425] Intake/Output this shift:    PE: Gen:  Alert, NAD, pleasant Card:  RRR, no M/G/R heard Pulm:  CTA, no W/R/R Abd: Soft, NT/ND, +BS, no HSM, some small petechia over abdomen. splenectomy incision with staples and honeycomb dressing intact; can remove dressing today/tomorrow and replace with non-stick dressing.  Lab Results:   Recent Labs  09/19/15 0540 09/20/15 0500  WBC 17.4* 23.8*  HGB 9.0* 8.1*  HCT 26.8* 24.6*  PLT <5* <5*   BMET  Recent Labs  09/19/15 0540 09/20/15 0500  NA 139 139  K 3.3* 3.8  CL 106 95*  CO2 27 27  GLUCOSE 115* 122*  BUN 6 <5*  CREATININE 0.61 0.50  CALCIUM 8.1* 9.0   PT/INR No results for input(s): LABPROT, INR in the last 72 hours. CMP     Component Value Date/Time   NA 139 09/20/2015 0500   K 3.8 09/20/2015 0500   CL 95* 09/20/2015 0500   CO2 27 09/20/2015 0500   GLUCOSE 122* 09/20/2015 0500   BUN <5* 09/20/2015 0500   CREATININE 0.50 09/20/2015 0500   CALCIUM 9.0 09/20/2015 0500   PROT 8.4* 09/05/2015 0538   ALBUMIN 2.8* 09/05/2015 0538   AST 19 09/05/2015  0538   ALT 27 09/05/2015 0538   ALKPHOS 49 09/05/2015 0538   BILITOT 0.2* 09/05/2015 0538   GFRNONAA >60 09/20/2015 0500   GFRAA >60 09/20/2015 0500   Lipase  No results found for: LIPASE  Studies/Results: Ct Head Wo Contrast  09/20/2015  CLINICAL DATA:  Initial evaluation for worsening headaches with pain in status. ITP patient. EXAM: CT HEAD WITHOUT CONTRAST TECHNIQUE: Contiguous axial images were obtained from the base of the skull through the vertex without intravenous contrast. COMPARISON:  Prior CT from 09/08/2015. FINDINGS: Cerebral volume within normal limits for patient age. No significant white matter disease present. Previously noted tiny subcortical hyperdensities along the gray-white matter differentiation within the bilateral parietal regions again seen, stable. There is new a subtle serpiginous hyperdensity along the left sylvian fissure, suspicious for a small amount of acute subarachnoid hemorrhage (series 2, image 13). This is new from previous. Additionally, there may be faint subarachnoid hemorrhage within the left frontal lobe more superiorly (series 2, image 17). Small amount may be present posteriorly within the left parietal lobe as well (series 2, image 17). Trace subarachnoid suspected within the anterior right frontal lobe (series 2, image 20). Possible trace subarachnoid along the falx (series 2, image 21). Possible trace thin subdural anteriorly along the falx, not entirely certain (series 2, image 24). No frank parenchymal hemorrhage. No intraventricular blood. No extra-axial fluid collection. No mass lesion, midline shift, or  mass effect. No hydrocephalus. No acute large vessel territory infarct. Gray-white matter differentiation maintained. Scalp soft tissues within normal limits. No acute abnormality about the globes and orbits. Mucosal thickening within the right maxillary sinus. Mucosal thickening with fluid levels within the sphenoid sinuses and posterior left ethmoidal  air cells. Paranasal sinuses are otherwise largely clear. No mastoid effusion. Calvarium intact. IMPRESSION: 1. Subtle serpiginous hyperdensities at the left sylvian fissure, bilateral frontal lobes, left parietal lobe, and along the falx, suspicious for small volume acute subarachnoid hemorrhage. This is new relative to most recent CT from 09/08/2015. 2. Probable trace subdural blood along the falx without significant mass effect. 3. Fluid levels within the sphenoid sinuses, suggestive of acute sinusitis. Critical Value/emergent results were called by telephone at the time of interpretation on 09/20/2015 at 4:19 am to Dr. Candis Shine'Sullvian , who verbally acknowledged these results. Electronically Signed   By: Rise MuBenjamin  McClintock M.D.   On: 09/20/2015 04:20   Dg Chest Port 1 View  09/18/2015  CLINICAL DATA:  Fever EXAM: PORTABLE CHEST 1 VIEW COMPARISON:  09/16/2015 chest radiograph. FINDINGS: Right internal jugular central venous catheter terminates in the lower third of the superior vena cava. Stable cardiomediastinal silhouette with top-normal heart size. No pneumothorax. No pleural effusion. Low lung volumes. Platelike atelectasis in the left mid lung. Faint patchy opacities at the left lung base. IMPRESSION: 1. Faint patchy left lung base opacities, cannot exclude a developing pneumonia. 2. Platelike atelectasis in the left mid lung.  Low lung volumes. Electronically Signed   By: Delbert PhenixJason A Poff M.D.   On: 09/18/2015 13:25    Anti-infectives: Anti-infectives    Start     Dose/Rate Route Frequency Ordered Stop   09/18/15 0000  vancomycin (VANCOCIN) IVPB 1000 mg/200 mL premix  Status:  Discontinued     1,000 mg 200 mL/hr over 60 Minutes Intravenous Every 8 hours 09/17/15 0906 09/17/15 1709   09/17/15 2300  ceFEPIme (MAXIPIME) 2 g in dextrose 5 % 50 mL IVPB     2 g 100 mL/hr over 30 Minutes Intravenous Every 12 hours 09/17/15 1724     09/17/15 1800  piperacillin-tazobactam (ZOSYN) IVPB 3.375 g  Status:   Discontinued     3.375 g 12.5 mL/hr over 240 Minutes Intravenous Every 8 hours 09/17/15 0906 09/17/15 1712   09/17/15 0900  piperacillin-tazobactam (ZOSYN) IVPB 3.375 g     3.375 g 100 mL/hr over 30 Minutes Intravenous STAT 09/17/15 0847 09/17/15 1207   09/17/15 0900  vancomycin (VANCOCIN) 2,000 mg in sodium chloride 0.9 % 500 mL IVPB     2,000 mg 250 mL/hr over 120 Minutes Intravenous  Once 09/17/15 0847 09/17/15 1337   09/16/15 0600  ceFAZolin (ANCEF) IVPB 2g/100 mL premix    Comments:  Pharmacy may adjust dosing strength, interval, or rate of medication as needed for optimal therapy for the patient  Send with patient on call to the OR.  Anesthesia to complete antibiotic administration <9160min [facility].   2 g 200 mL/hr over 30 Minutes Intravenous To Short Stay 09/15/15 1607 09/16/15 1025     Assessment/Plan ITP, refractory  - CT shows new area of subarachnoid bleeding; K-centra administered. 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

## 2015-09-20 NOTE — Progress Notes (Signed)
Bladder scanned for 42cc.

## 2015-09-20 NOTE — Progress Notes (Signed)
Here from short stay and hung Rituxan per MD orders.  Will monitor patient for rituxan infusion.

## 2015-09-20 NOTE — Progress Notes (Signed)
Patient ID: [name], female   DOB: 04/02/1975, 40 y.o.   MRN: [id number] Hematology: Stable over the day. Alert, oriented, still w tinnitus. Afebrile. Full EOMs, PERRL, motor 5/5 Follow up CT brain without further extension of bleed She tolerated dose #3 of Rituxan Family here. Status reviewed. Continue current management. We will check ANA, Direct Coomb's test, and immmunofixation electrophoresis to assess for other potential etiologies of her ITP. I am considering adding an immunosuppressive agent  Such as vincristine or Cyclophosphamide if no response in next 48 hours.

## 2015-09-20 NOTE — Progress Notes (Addendum)
Increased rate to 28100ml/hr per pharmacist, Windell Mouldinguth, and MD orders.  Will continue to monitor pt while on Rituxan infusion.

## 2015-09-20 NOTE — Progress Notes (Addendum)
Called by nurse that patient having worsening headache.   She states her head "feels like it is going to explode." Reports her headache is worse than earlier. Denies any bleeding other than bleeding from gums. Reports blurry vision and continued tinnitus. Nurse gave morphine 2 mg IV for pain.  Physical Exam General: alert, sitting up in bed, NAD HEENT: EOMI, dried blood in oral mucosa, gum bleeding present  Neuro: alert and oriented x 3. Pupils equal round reactive to light. EOMI. Tongue midline. Shoulder shrug intact. Strength 5/5 in upper and lower extremities bilaterally.   Plan: [name] is a 40yo woman with refractory ITP complaining of worsening headache. She was found to have an acute small SAH on 7/2.  I discussed the case with Dr. Josem KaufmannKlima and he agrees that we should obtain a STAT CT head. [facility]. Possible that the blood in the subarachnoid space is irritating the area and the cause of her headache.  If scan shows worsening of the bleed then can consider KCentra vs factor VIIa concentrate.  - f/u STAT CT head   Rich Numberarly Alice Lourenco, MD, MPH Internal Medicine Resident, PGY-III Pager: [phone]

## 2015-09-21 ENCOUNTER — Inpatient Hospital Stay (HOSPITAL_COMMUNITY): Payer: Medicaid Other

## 2015-09-21 DIAGNOSIS — I619 Nontraumatic intracerebral hemorrhage, unspecified: Secondary | ICD-10-CM

## 2015-09-21 DIAGNOSIS — H9313 Tinnitus, bilateral: Secondary | ICD-10-CM

## 2015-09-21 LAB — VITAMIN B12: Vitamin B-12: 362 pg/mL (ref 180–914)

## 2015-09-21 LAB — CBC WITH DIFFERENTIAL/PLATELET
BAND NEUTROPHILS: 24 %
BASOS ABS: 0 10*3/uL (ref 0.0–0.1)
BASOS PCT: 0 %
Blasts: 0 %
EOS ABS: 1.2 10*3/uL — AB (ref 0.0–0.7)
Eosinophils Relative: 5 %
HCT: 28.9 % — ABNORMAL LOW (ref 36.0–46.0)
HEMOGLOBIN: 9.5 g/dL — AB (ref 12.0–15.0)
Lymphocytes Relative: 9 %
Lymphs Abs: 2.1 10*3/uL (ref 0.7–4.0)
MCH: 28.4 pg (ref 26.0–34.0)
MCHC: 32.9 g/dL (ref 30.0–36.0)
MCV: 86.5 fL (ref 78.0–100.0)
MYELOCYTES: 2 %
Metamyelocytes Relative: 2 %
Monocytes Absolute: 0.9 10*3/uL (ref 0.1–1.0)
Monocytes Relative: 4 %
NEUTROS ABS: 19 10*3/uL — AB (ref 1.7–7.7)
Neutrophils Relative %: 54 %
PROMYELOCYTES ABS: 0 %
Platelets: <5 10*3/uL — CL (ref 150–400)
RBC: 3.34 MIL/uL — ABNORMAL LOW (ref 3.87–5.11)
RDW: 14.1 % (ref 11.5–15.5)
WBC Morphology: INCREASED
WBC: 23.2 10*3/uL — ABNORMAL HIGH (ref 4.0–10.5)
nRBC: 0 /100 WBC

## 2015-09-21 LAB — PREPARE PLATELET PHERESIS: Unit division: 0

## 2015-09-21 LAB — BASIC METABOLIC PANEL
Anion gap: 9 (ref 5–15)
BUN: 6 mg/dL (ref 6–20)
CALCIUM: 8.8 mg/dL — AB (ref 8.9–10.3)
CO2: 26 mmol/L (ref 22–32)
CREATININE: 0.51 mg/dL (ref 0.44–1.00)
Chloride: 103 mmol/L (ref 101–111)
Glucose, Bld: 127 mg/dL — ABNORMAL HIGH (ref 65–99)
Potassium: 3.6 mmol/L (ref 3.5–5.1)
SODIUM: 138 mmol/L (ref 135–145)

## 2015-09-21 LAB — DIRECT ANTIGLOBULIN TEST (NOT AT ARMC)
DAT, IgG: NEGATIVE
DAT, complement: NEGATIVE

## 2015-09-21 LAB — PROTIME-INR
INR: 1.34 (ref 0.00–1.49)
Prothrombin Time: 16.7 seconds — ABNORMAL HIGH (ref 11.6–15.2)

## 2015-09-21 LAB — APTT: APTT: 36 s (ref 24–37)

## 2015-09-21 LAB — GLUCOSE, CAPILLARY: Glucose-Capillary: 122 mg/dL — ABNORMAL HIGH (ref 65–99)

## 2015-09-21 MED ORDER — VINCRISTINE SULFATE CHEMO INJECTION 1 MG/ML
2.0000 mg | Freq: Once | INTRAVENOUS | Status: AC
  Administered 2015-09-21: 2 mg via INTRAVENOUS
  Filled 2015-09-21: qty 2

## 2015-09-21 MED ORDER — LORAZEPAM 0.5 MG PO TABS: 0.5000 mg | ORAL_TABLET | Freq: Once | ORAL | Status: DC | PRN

## 2015-09-21 MED ORDER — GADOBENATE DIMEGLUMINE 529 MG/ML IV SOLN
20.0000 mL | Freq: Once | INTRAVENOUS | Status: AC
  Administered 2015-09-21: 18 mL via INTRAVENOUS

## 2015-09-21 NOTE — Progress Notes (Signed)
5 Days Post-Op  Subjective: Still has blurry vision and ringing in ears. Feels a little better though  Objective: Vital signs in last 24 hours: Temp:  [97.8 F (36.6 C)-99.1 F (37.3 C)] 98.8 F (37.1 C) (07/04 0800) Pulse Rate:  [50-88] 71 (07/04 0800) Resp:  [14-24] 20 (07/04 0800) BP: (84-115)/(54-96) 111/96 mmHg (07/04 0800) SpO2:  [95 %-100 %] 97 % (07/04 0800) Last BM Date: 09/20/15  Intake/Output from previous day: 07/03 0701 - 07/04 0700 In: 2293 [P.O.:480; I.V.:1425; Blood:388] Out: 4450 [Urine:4450] Intake/Output this shift: Total I/O In: 195 [Blood:195] Out: -   Resp: clear to auscultation bilaterally Cardio: regular rate and rhythm GI: soft, nontender. good bs. incision looks good  Lab Results:   Recent Labs  09/20/15 0500 09/21/15 0550  WBC 23.8* 23.2*  HGB 8.1* 9.5*  HCT 24.6* 28.9*  PLT <5* <5*   BMET  Recent Labs  09/20/15 0500 09/21/15 0550  NA 139 138  K 3.8 3.6  CL 95* 103  CO2 27 26  GLUCOSE 122* 127*  BUN <5* 6  CREATININE 0.50 0.51  CALCIUM 9.0 8.8*   PT/INR No results for input(s): LABPROT, INR in the last 72 hours. ABG No results for input(s): PHART, HCO3 in the last 72 hours.  Invalid input(s): PCO2, PO2  Studies/Results: Ct Head Wo Contrast  09/20/2015  CLINICAL DATA:  Severe headache and blurred vision.  Tinnitus. EXAM: CT HEAD WITHOUT CONTRAST TECHNIQUE: Contiguous axial images were obtained from the base of the skull through the vertex without intravenous contrast. COMPARISON:  09/20/2015, 09/08/2015 and 08/28/2015 FINDINGS: Ventricles are within normal. Basal cisterns somewhat less well-defined compared to the previous exams. Examination again demonstrates subtle foci of hyperdensity over the high right posterior parietal region without significant change. The previously noted hyperdense focus over the high left posterior parietal region is not as well seen. There is possible new mild hyperdensity in the region of the left  sylvian fissure as these findings suggest areas of acute subarachnoid hemorrhage. Possible subtle hyperdensity over the subarachnoid space bifrontal region. Sulcal spacers are less prominent compared to the prior exams suggesting possible mild local mass effect/edema. No evidence of acute infarction and no midline shift. Remaining bones and soft tissues are unchanged. IMPRESSION: Subtle foci of hyperdensity over the high posterior right parietal region which is stable with new hyperdense focus over the left sylvian fissure suggesting acute hemorrhage. Sulcal spaces overall as well as basal cisterns less prominent compared to previous exams possibly related to mild local mass effect/edema. MRI may be helpful for further evaluation. These results were called by telephone at the time of interpretation on 09/20/2015 at 8:41 pm to Dr. Ron Agee, who verbally acknowledged these results. Electronically Signed   By: Elberta Fortis M.D.   On: 09/20/2015 20:42   Ct Head Wo Contrast  09/20/2015  CLINICAL DATA:  Persistent headaches EXAM: CT HEAD WITHOUT CONTRAST TECHNIQUE: Contiguous axial images were obtained from the base of the skull through the vertex without intravenous contrast. COMPARISON:  09/20/2015 FINDINGS: Bony calvarium is intact. No gross soft tissue abnormality is noted. Persistent areas of increased attenuation are noted bilaterally and along the falx similar to that seen on the prior exam. The overall appearance is stable. No new focus of hemorrhage is identified. No acute infarct or space-occupying mass lesion is noted. IMPRESSION: Stable areas of increased attenuation within the subarachnoid space bilaterally. No new focal abnormality is noted. Electronically Signed   By: Eulah Pont.D.  On: 09/20/2015 09:14   Ct Head Wo Contrast  09/20/2015  CLINICAL DATA:  Initial evaluation for worsening headaches with pain in status. ITP patient. EXAM: CT HEAD WITHOUT CONTRAST TECHNIQUE: Contiguous axial images  were obtained from the base of the skull through the vertex without intravenous contrast. COMPARISON:  Prior CT from 09/08/2015. FINDINGS: Cerebral volume within normal limits for patient age. No significant white matter disease present. Previously noted tiny subcortical hyperdensities along the gray-white matter differentiation within the bilateral parietal regions again seen, stable. There is new a subtle serpiginous hyperdensity along the left sylvian fissure, suspicious for a small amount of acute subarachnoid hemorrhage (series 2, image 13). This is new from previous. Additionally, there may be faint subarachnoid hemorrhage within the left frontal lobe more superiorly (series 2, image 17). Small amount may be present posteriorly within the left parietal lobe as well (series 2, image 17). Trace subarachnoid suspected within the anterior right frontal lobe (series 2, image 20). Possible trace subarachnoid along the falx (series 2, image 21). Possible trace thin subdural anteriorly along the falx, not entirely certain (series 2, image 24). No frank parenchymal hemorrhage. No intraventricular blood. No extra-axial fluid collection. No mass lesion, midline shift, or mass effect. No hydrocephalus. No acute large vessel territory infarct. Gray-white matter differentiation maintained. Scalp soft tissues within normal limits. No acute abnormality about the globes and orbits. Mucosal thickening within the right maxillary sinus. Mucosal thickening with fluid levels within the sphenoid sinuses and posterior left ethmoidal air cells. Paranasal sinuses are otherwise largely clear. No mastoid effusion. Calvarium intact. IMPRESSION: 1. Subtle serpiginous hyperdensities at the left sylvian fissure, bilateral frontal lobes, left parietal lobe, and along the falx, suspicious for small volume acute subarachnoid hemorrhage. This is new relative to most recent CT from 09/08/2015. 2. Probable trace subdural blood along the falx  without significant mass effect. 3. Fluid levels within the sphenoid sinuses, suggestive of acute sinusitis. Critical Value/emergent results were called by telephone at the time of interpretation on 09/20/2015 at 4:19 am to Dr. Candis Shine'Sullvian , who verbally acknowledged these results. Electronically Signed   By: Rise MuBenjamin  McClintock M.D.   On: 09/20/2015 04:20    Anti-infectives: Anti-infectives    Start     Dose/Rate Route Frequency Ordered Stop   09/18/15 0000  vancomycin (VANCOCIN) IVPB 1000 mg/200 mL premix  Status:  Discontinued     1,000 mg 200 mL/hr over 60 Minutes Intravenous Every 8 hours 09/17/15 0906 09/17/15 1709   09/17/15 2300  ceFEPIme (MAXIPIME) 2 g in dextrose 5 % 50 mL IVPB  Status:  Discontinued     2 g 100 mL/hr over 30 Minutes Intravenous Every 12 hours 09/17/15 1724 09/20/15 0816   09/17/15 1800  piperacillin-tazobactam (ZOSYN) IVPB 3.375 g  Status:  Discontinued     3.375 g 12.5 mL/hr over 240 Minutes Intravenous Every 8 hours 09/17/15 0906 09/17/15 1712   09/17/15 0900  piperacillin-tazobactam (ZOSYN) IVPB 3.375 g     3.375 g 100 mL/hr over 30 Minutes Intravenous STAT 09/17/15 0847 09/17/15 1207   09/17/15 0900  vancomycin (VANCOCIN) 2,000 mg in sodium chloride 0.9 % 500 mL IVPB     2,000 mg 250 mL/hr over 120 Minutes Intravenous  Once 09/17/15 0847 09/17/15 1337   09/16/15 0600  ceFAZolin (ANCEF) IVPB 2g/100 mL premix    Comments:  Pharmacy may adjust dosing strength, interval, or rate of medication as needed for optimal therapy for the patient  Send with patient on call to the  OR.  Anesthesia to complete antibiotic administration <4260min prior to incision per [facility].   2 g 200 mL/hr over 30 Minutes Intravenous To Short Stay 09/15/15 1607 09/16/15 1025      Assessment/Plan: s/p Procedure(s): OPEN SPLENECTOMY (N/A) thrombocytopenia. still not responding to splenectomy  ICH. Receiving plts and decadron Gum bleeding seems to be improved Monitor closely  LOS:  24 days    TOTH III,PAUL S 09/21/2015

## 2015-09-21 NOTE — Progress Notes (Signed)
Subjective: Given 62m morphine overnight for severe worsening HA. Continues to complain of HA this morning with buzzing in her ears and blurry vision in her R eye. Bleeding in her mouth has stopped. Endorses nausea.   Objective: Vital signs in last 24 hours: Filed Vitals:   09/21/15 0536 09/21/15 0600 09/21/15 0613 09/21/15 0615  BP: 111/74 102/58 112/73 115/72  Pulse: 65 64 69 62  Temp: 99 F (37.2 C)  98.3 F (36.8 C)   TempSrc: Oral  Oral   Resp: '24 23 21 23  ' Height:      Weight:      SpO2: 96% 98% 97% 97%   Labs:  Ref. Range 09/21/2015 05:50  Sodium Latest Ref Range: 135-145 mmol/L 138  Potassium Latest Ref Range: 3.5-5.1 mmol/L 3.6  Chloride Latest Ref Range: 101-111 mmol/L 103  CO2 Latest Ref Range: 22-32 mmol/L 26  BUN Latest Ref Range: 6-20 mg/dL 6  Creatinine Latest Ref Range: 0.44-1.00 mg/dL 0.51  Calcium Latest Ref Range: 8.9-10.3 mg/dL 8.8 (L)  EGFR (Non-African Amer.) Latest Ref Range: >60 mL/min >60  EGFR (African American) Latest Ref Range: >60 mL/min >60  Glucose Latest Ref Range: 65-99 mg/dL 127 (H)  Anion gap Latest Ref Range: 5-15  9  WBC Latest Ref Range: 4.0-10.5 K/uL 23.2 (H)  RBC Latest Ref Range: 3.87-5.11 MIL/uL 3.34 (L)  Hemoglobin Latest Ref Range: 12.0-15.0 g/dL 9.5 (L)  HCT Latest Ref Range: 36.0-46.0 % 28.9 (L)  MCV Latest Ref Range: 78.0-100.0 fL 86.5  MCH Latest Ref Range: 26.0-34.0 pg 28.4  MCHC Latest Ref Range: 30.0-36.0 g/dL 32.9  RDW Latest Ref Range: 11.5-15.5 % 14.1  Platelets Latest Ref Range: 150-400 K/uL <5 (LL)  Neutrophils Latest Units: % 54  Lymphocytes Latest Units: % 9  Monocytes Relative Latest Units: % 4  Eosinophil Latest Units: % 5  Basophil Latest Units: % 0  NEUT# Latest Ref Range: 1.7-7.7 K/uL 19.0 (H)  Lymphocyte # Latest Ref Range: 0.7-4.0 K/uL 2.1  Monocyte # Latest Ref Range: 0.1-1.0 K/uL 0.9  Eosinophils Absolute Latest Ref Range: 0.0-0.7 K/uL 1.2 (H)  Basophils Absolute Latest Ref Range: 0.0-0.1 K/uL 0.0    RBC Morphology Unknown RARE NRBCs  WBC Morphology Unknown INCREASED BANDS (...  Myelocytes Latest Units: % 2  Metamyelocytes Relative Latest Units: % 2  Promyelocytes Absolute Latest Units: % 0  Blasts Latest Units: % 0  nRBC Latest Ref Range: 0 /100 WBC 0  Band Neutrophils Latest Units: % 24    Physical Exam Constitutional: NAD HEENT: Normocephalic, atraumatic, ginvival bleeding has stopped. Cardiovascular: RRR, no m/r/g Pulmonary/Chest: CTAB GU: Foley in place, +hematuria  Extremities: distal pulses intact bilaterally, no edema Skin: petechial rash on face improving Psychiatric: mood and affect normal  Assessment/Plan: Severe thrombocytopenia 2/2 ITP: Platelet count remains undetectable today. S/p IVIG x 6/10-11 and 6/13 and decadron 6/10 -13. Initially with CT showing punctate hemorrhages, with repeat CT head wo contrast 6/21 negative for bleeding. She has ongoing mucosal bleeds with a mild downward trend in Hgb. 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 Over the past few days, has been experienced worsening HA, dizziness, and tinnitus. Non con head CT consistent with small, new SAH.  Given Kcentra on 07/03. Third head CT overnight (7/3) with stable hyperdensity in the posterior R parietnal region and a new hyperdense focus of the left sylvian fissure consistent with acute hemorrhage. Also with signs of mild local  mass effect or edema.  - Plan for MRI today to further assess cerebral edema  - Per Heme:  - Given 38m Decadron stat yesterday (7/3) with plans to continue [id number]   - Given two units of platelets yesterday with recombinant VIIa immediately following  - Check ANA, direct coombs, and immunofixation electrophoresis   - Plan to start chemotherapy agent tomorrow  - Rituximab held 6/30 in setting of fever. Restarted 07/03.  - Continue amicar - CBC daily - Continue to monitor platelets   - Dilaudid PCA DC'd. Started Tramadol 5044m6h prn and increased prn tylenol to 500-1000m35m Surgery and heme following, appreciate recs  Anemia: - Hgb 9.5 today, last transfused 2u pRBCs on 7/1 - Continue to monitor  Fever: afebrile this morning, tachycardia resolved, BP stable. Leukocytosis 23 this morning from 17.4 yesterday with left shift. Positive UA on 6/30 with many bacteria, small leukocytes, negative nitrite, 6-30 WBC. BCCx NGTD. CXR 09/18/15 with patchy opacities, likely 2/2 to atelectasis.  - Discontinue cefepime, continue to monitor  Urinary Retention: - Foley in place with good UOP, +hematuria  - Lidocaine jelly for irritation  - Continue to monitor   FULL code Dispo: Anticipated discharge in approximately 5-7 day(s).   LOS: 24 days   [name] 09/21/2015, 6:27 AM Pager: [phone]

## 2015-09-21 NOTE — Progress Notes (Signed)
Pt temp was 100.4, gave medication

## 2015-09-21 NOTE — Progress Notes (Signed)
Patient ID: [name], female   DOB: 05/01/1975, 40 y.o.   MRN: [id number] Hematology: Stable overnight Started decadron. Given platelet transfusions and one dose of recombinant VIIa without incident. Low grade temp likely minor reaction to platelets. Further increase in WBC. AM smear reviewed. Still no platelets. Now with occasional myelocyte/metamyelocyte. No blasts. Persistent nucleated RBCs. Hb 9.5 Exam: Blood pressure 111/96, pulse 71, temperature 98.8 F (37.1 C), temperature source Oral, resp. rate 20, height 5\' 1"  (1.549 m), weight 234 lb (106.142 kg), last menstrual period 08/23/2015, SpO2 97 %, currently breastfeeding.  Awake, alert, oriented PERRL, full EOMs, upper body coordination normal, motor 5/5 Lungs clear, heart regular Abdominal wound healing. Dry. Impression: #1. Refractory ITP: D34, d5 post splenectomy S/P 3 doses of Rituxan/Romiplostim I discussed vincristine chemo with patient and son who translates. Potential side efffects neuropathy & constipation/ileus. She has agreed to proceed with same. I will try to get dose today then weekly x 4. Dose #4. Romiplostim tomorrow. I am reluctant to use plasma exchange since it would require a vascular catheter and bleeding risk extremely high.  #2. Low grade intracranial bleed. No focal neuro deficit but unstable situation. Decadron started to minimize cerebral edema. I would give additional platelets only on an emergency basis since they have been rapidly destroyed after administration. Repeat doses of VIIa for emergency use. Consider MRI for more accurate assessment of extent of bleeding.   Cephas DarbyJames Samaia Iwata, MD, FACP  Hematology-Oncology/Internal Medicine [phone]

## 2015-09-21 NOTE — Progress Notes (Signed)
PT Cancellation Note  Patient Details Name: [name] MRN: [id number] DOB: 12/19/1975   Cancelled Treatment:    Reason Eval/Treat Not Completed: Patient not medically ready. Pt continues to to have increased cerebral bleeding and MRI ordered and pending. On duty room nurse recommended no PT.    Gertie BaronMegan Bintou Lafata, [place]PT[place] #[phone]  09/21/2015, 11:41 AM

## 2015-09-22 DIAGNOSIS — R791 Abnormal coagulation profile: Secondary | ICD-10-CM

## 2015-09-22 DIAGNOSIS — D693 Immune thrombocytopenic purpura: Secondary | ICD-10-CM | POA: Insufficient documentation

## 2015-09-22 DIAGNOSIS — I619 Nontraumatic intracerebral hemorrhage, unspecified: Secondary | ICD-10-CM | POA: Insufficient documentation

## 2015-09-22 LAB — PREPARE PLATELET PHERESIS
UNIT DIVISION: 0
Unit division: 0
Unit division: 0

## 2015-09-22 LAB — CBC WITH DIFFERENTIAL/PLATELET
BASOS PCT: 1 %
Basophils Absolute: 0.4 10*3/uL — ABNORMAL HIGH (ref 0.0–0.1)
EOS PCT: 0 %
Eosinophils Absolute: 0 10*3/uL (ref 0.0–0.7)
HEMATOCRIT: 27.3 % — AB (ref 36.0–46.0)
HEMOGLOBIN: 8.9 g/dL — AB (ref 12.0–15.0)
LYMPHS ABS: 2.6 10*3/uL (ref 0.7–4.0)
Lymphocytes Relative: 7 %
MCH: 27.9 pg (ref 26.0–34.0)
MCHC: 32.6 g/dL (ref 30.0–36.0)
MCV: 85.6 fL (ref 78.0–100.0)
MONOS PCT: 6 %
Monocytes Absolute: 2.2 10*3/uL — ABNORMAL HIGH (ref 0.1–1.0)
NEUTROS PCT: 86 %
Neutro Abs: 31.8 10*3/uL — ABNORMAL HIGH (ref 1.7–7.7)
Platelets: 6 10*3/uL — CL (ref 150–400)
RBC: 3.19 MIL/uL — ABNORMAL LOW (ref 3.87–5.11)
RDW: 14.2 % (ref 11.5–15.5)
WBC: 37 10*3/uL — AB (ref 4.0–10.5)

## 2015-09-22 LAB — CULTURE, BLOOD (ROUTINE X 2)
CULTURE: NO GROWTH
CULTURE: NO GROWTH

## 2015-09-22 LAB — FOLATE RBC
Folate, Hemolysate: 316.8 ng/mL
Folate, RBC: 1160 ng/mL (ref >498)
Hematocrit: 27.3 % — ABNORMAL LOW (ref 34.0–46.6)

## 2015-09-22 MED ORDER — ROMIPLOSTIM INJECTION 500 MCG: 6.0000 ug/kg | Freq: Once | SUBCUTANEOUS | Status: DC

## 2015-09-22 MED ORDER — PROMETHAZINE HCL 25 MG PO TABS: 25.0000 mg | ORAL_TABLET | Freq: Four times a day (QID) | ORAL | Status: DC | PRN

## 2015-09-22 MED ORDER — TRAMADOL HCL 50 MG PO TABS: 50.0000 mg | ORAL_TABLET | Freq: Four times a day (QID) | ORAL | Status: DC | PRN

## 2015-09-22 MED ORDER — ROMIPLOSTIM 250 MCG ~~LOC~~ SOLR
6.0000 ug/kg | Freq: Once | SUBCUTANEOUS | Status: DC
  Filled 2015-09-22: qty 1.27

## 2015-09-22 MED ORDER — AMINOCAPROIC ACID 250 MG/ML IV SOLN: 4.0000 g | INTRAVENOUS | Status: DC

## 2015-09-22 MED ORDER — ACETAMINOPHEN 500 MG PO TABS: 500.0000 mg | ORAL_TABLET | Freq: Four times a day (QID) | ORAL | Status: DC | PRN

## 2015-09-22 MED ORDER — MORPHINE SULFATE (PF) 2 MG/ML IV SOLN: 1.0000 mg | INTRAVENOUS | Status: DC | PRN

## 2015-09-22 MED ORDER — NORETHINDRONE ACETATE 5 MG PO TABS: 5.0000 mg | ORAL_TABLET | Freq: Every day | ORAL | Status: DC

## 2015-09-22 MED ORDER — PHENYLEPHRINE HCL 0.25 % NA SOLN: 1.0000 | Freq: Four times a day (QID) | NASAL | Status: DC | PRN

## 2015-09-22 MED ORDER — DEXAMETHASONE SODIUM PHOSPHATE 4 MG/ML IJ SOLN: 4.0000 mg | Freq: Four times a day (QID) | INTRAMUSCULAR | Status: DC

## 2015-09-22 MED ORDER — AMINOCAPROIC ACID 0.25 GM/ML PO SOLN: 1.0000 g | ORAL | Status: DC

## 2015-09-22 NOTE — Progress Notes (Signed)
Patient ID: [name], female   DOB: 10/29/1975, 39 y.o.   MRN: [id number] Hematology: Quiet night. Persistent headache/tinnitus MRI confirms low grade subarachnoid blood but not extensive. No cerebral edema.  Minimal oral/vaginal bleeding She received 1st dose of vincristine 2 mg on 7/4 Exam: Blood pressure 125/77, pulse 46, temperature 97.9 F (36.6 C), temperature source Axillary, resp. rate 23, height 5' 1" (1.549 m), weight 234 lb (106.142 kg), last menstrual period 08/23/2015, SpO2 96 %, currently breastfeeding.  Afebrile off antibiotics Pharynx: no active bleeding Lungs clear Heart regular  Abdomen wound healing Neuro awake, alert, oriented, full EOMs, upper body coordination normal, motor 5/5 Lab: Smear reviewed. I saw one lonely platelet. 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  2.Low grade CNS bleed Stable for moment on decadron & AMICAR.  3. Mild elevation of Protime. Normal liver functions Give vitamin K PO 5 mg daily x 3  Family increasingly frustrated by her lack of progress - request transfer to [facility]. I will make arrangements.  

## 2015-09-22 NOTE — Progress Notes (Signed)
Subjective: Tearful this morning, continues to complain of headache and ringing in the ears. Complains of insomnia overnight, requesting sleep medication.   Objective: Vital signs in last 24 hours: Filed Vitals:   09/22/15 0100 09/22/15 0400 09/22/15 0529 09/22/15 0700  BP: 110/66 113/67 114/72 125/77  Pulse: 46 44 51 46  Temp:  98.6 F (37 C) 98.4 F (36.9 C) 97.9 F (36.6 C)  TempSrc:  Oral Oral Axillary  Resp: 24 21 22 23   Height:      Weight:      SpO2: 96% 97% 96% 96%   Labs:  Ref. Range 09/22/2015 05:09  WBC Latest Ref Range: 4.0-10.5 K/uL 37.0 (H)  RBC Latest Ref Range: 3.87-5.11 MIL/uL 3.19 (L)  Hemoglobin Latest Ref Range: 12.0-15.0 g/dL 8.9 (L)  HCT Latest Ref Range: 36.0-46.0 % 27.3 (L)  MCV Latest Ref Range: 78.0-100.0 fL 85.6  MCH Latest Ref Range: 26.0-34.0 pg 27.9  MCHC Latest Ref Range: 30.0-36.0 g/dL 78.232.6  RDW Latest Ref Range: 11.5-15.5 % 14.2  Platelets Latest Ref Range: 150-400 K/uL 6 (LL)   Direct Coombs:   Ref. Range 09/21/2015 16:45  DAT, complement Unknown NEG  DAT, IgG Unknown NEG    ANA - pending Immunofixation electrophoresis - pending  Folate - pending Vit B12 - 362 (wnl)  Imaging:  09/21/15 MR Brain W Wo Contrast Study confirms a small amount of subarachnoid blood in the sylvian fissure and along the convexities left more than right. There could also be a very tiny subdural hematoma in the left frontal region measuring no more than 1 or 2 mm in thickness.  Unusual area of T2 and FLAIR signal within the inferior temporal tip on the left measuring 7 x 15 x 15 mm. This could represent chronic gliosis or could be edema related either trauma or mild venous congestion perhaps in conjunction with the subarachnoid hemorrhage. Low-grade neoplasm not excluded, but unlikely.  Physical Exam Constitutional: NAD HEENT: Normocephalic, atraumatic, ginvival bleeding has stopped. Cardiovascular: RRR, no m/r/g Pulmonary/Chest: CTAB GU: Foley in  place, +hematuria  Extremities: distal pulses intact bilaterally, no edema Skin: no rashes or lesions. Petechial rash on face has resolved. Psychiatric: mood and affect normal  Assessment/Plan: Severe thrombocytopenia 2/2 ITP: Platelet count remains undetectable today. S/p IVIG x 6/10-11 and 6/13 and decadron 6/10 -13. Initially with CT showing punctate hemorrhages, with repeat CT head wo contrast 6/21 negative for bleeding. She has ongoing mucosal bleeds with a mild downward trend in Hgb. 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 Over the past few days, has been experienced worsening HA, dizziness, and tinnitus. Multiple CTs consistent with small, acute hemorrhage.Given Kcentra on 07/03.  MRI (7/5) with small SAH in the sylvian fissure as well as possible, small subdural hematoma in the left frontal region. Also with area in the inferior temporal lobe 7 x 15 x 15 mm likely chronic gliosis or edema from venous congestion.  - Per Heme: Continue Decadron  4mg  q6hrs to minimize cerebral edema; Received one dose of vincristine last night - Rituximab held 6/30 in setting of fever. Restarted 07/03.  - Continue amicar - CBC daily, continue to monitor platelets  - Dilaudid PCA DC'd. Started Tramadol 50mg  q6h prn and increased prn tylenol to 500-1000mg   - Surgery and heme following, appreciate recs  Anemia: - Hgb 8.9 today, last transfused 2u pRBCs on 7/1 - Continue to monitor  Fever: afebrile this morning, tachycardia  resolved, BP stable. Leukocytosis 23 this morning from 17.4 yesterday with left shift. Positive UA on 6/30 with many bacteria, small leukocytes, negative nitrite, 6-30 WBC. BCCx NGTD. CXR 09/18/15 with patchy opacities, likely 2/2 to atelectasis.  - Discontinue cefepime, continue to monitor  Urinary Retention: - Foley in place with good UOP, +hematuria  - Lidocaine jelly for irritation  -  Continue to monitor   Dispo: Anticipated discharge in approximately 5-7 day(s).   LOS: 25 days   Alice Pollarolyn Aradhana Gin, MD 09/22/2015, 10:28 AM Pager: [phone]

## 2015-09-22 NOTE — Progress Notes (Signed)
[facility]. Report called to receiving nurse, Joselyn Glassmanyler, and all questions answered. Family made aware of transfer.

## 2015-09-22 NOTE — Progress Notes (Signed)
PT Cancellation Note  Patient Details Name: [name] MRN: [id number] DOB: 04/28/1975   Cancelled Treatment:    Reason Eval/Treat Not Completed: Patient not medically ready;Other (comment) (Pt bradycardic and dizzy upon sitting up). [facility]. On duty RN recommended no treatment today. Will check back tomorrow if [facility].   Gertie BaronMegan Bhavesh Vazquez, [place]PTA #[phone]  09/22/2015, 10:51 AM

## 2015-09-22 NOTE — Discharge Summary (Signed)
Name: [name] MRN: [id number] DOB: 05/31/1975 40 y.o. PCP: No Pcp Per Patient  Date of Admission: 08/28/2015 11:21 AM Date of Discharge: 09/22/2015 Attending Physician: Burns SpainElizabeth A Butcher, MD  Discharge Diagnosis: 1. Severe, refractory ITP  Discharge Medications:   Medication List    TAKE these medications        acetaminophen 500 MG tablet  Commonly known as:  TYLENOL  Take 1-2 tablets (500-1,000 mg total) by mouth every 6 (six) hours as needed for mild pain or moderate pain.     aminocaproic acid 25 % solution  Commonly known as:  AMICAR  Take 4 mLs (1 g total) by mouth every 4 (four) hours.     aminocaproic acid 4 g in sodium chloride 0.9 % 50 mL  Inject 4 g into the vein every 4 (four) hours.     dexamethasone 4 MG/ML injection  Commonly known as:  DECADRON  Inject 1 mL (4 mg total) into the vein every 6 (six) hours.     morphine 2 MG/ML injection  Inject 0.5-2 mLs (1-4 mg total) into the vein every hour as needed.     norethindrone 5 MG tablet  Commonly known as:  AYGESTIN  Take 1 tablet (5 mg total) by mouth daily.     OVER THE COUNTER MEDICATION  Take 1 tablet by mouth daily. Herbal Lite Weight Loss Supplement     phenylephrine 0.25 % nasal spray  Commonly known as:  NEO-SYNEPHRINE  Place 1 spray into both nostrils every 6 (six) hours as needed for congestion.     promethazine 25 MG tablet  Commonly known as:  PHENERGAN  Take 1 tablet (25 mg total) by mouth every 6 (six) hours as needed for nausea or vomiting.     romiPLOStim 500 MCG injection  Commonly known as:  NPLATE  Inject 1.27 mLs (635 mcg total) into the skin once.     traMADol 50 MG tablet  Commonly known as:  ULTRAM  Take 1 tablet (50 mg total) by mouth every 6 (six) hours as needed for severe pain.        Disposition and follow-up:   [name] [facility].  At the hospital follow up visit please  address:  1.  Please continue to follow up routine neuro checks as [facility] is [facility] or [facility] hemorrhage. No focal neurological deficit at present. Symptoms are limited to severe HA, tinnitus, and blurry vision. Patient experiencing active vaginal bleeding as well as hematuria and gingival bleeding. Please transfuse as necessary.   2.  Labs / imaging needed at time of follow-up: Daily CBC w/ diff  3.  Pending labs/ test needing follow-up: ANA, immunofixation electrophoresis    Hospital Course by problem list: Principal Problem:   Acute ITP (HCC) Active Problems:   Chronic hepatitis B (HCC)   Nontraumatic subarachnoid hemorrhage (HCC)   Severe thrombocytopenia (HCC)   Gingival bleeding   Pressure ulcer   Menorrhagia with regular cycle   ITP (idiopathic thrombocytopenic purpura)   Nontraumatic intracerebral hemorrhage (HCC)   1. Severe, Refractory ITP: [name] is a 40 yo F who presented to the ED with spontaneous gingival bleeding, petechial rash, and HA. Fibrinogen and coags were normal but platelets were critically low with overall appearance consistent with severe ITP. Normal wbc, normal hemaglobin and negative DIC panel. Head CT showed evidence of spontaneous ICH. No focal neurological deficits were present and no symptoms other  than headache. Emergent platelet transfusion (1 unit) was administered with high dose decadron and IVIG. Patient was admitted to stepdown for close monitoring. Platelets remained undetectable following transfusion. She was continued on high dose dexamethasone 40 IV for 4 days following admission and received a second dose of IVIG on day 4.  Patient was started on amicar to decrease risk bleeding and thrombopoietin and rituximab were given weekly. At this time it appeared that her platelets had improved but remained critically low at 7K. This was felt to be artifact as there were no identifiable platelets on blood smear. Testing for  secondary causes of thrombocytopenia were non contributory. Hep C antibodies were negative. Hep B surface Ab+ and hep B antigen negative consistent with immunity or prior infection. Direct coombs test negative. ANA and immunofixation electrophoresis pending on discharge. LFTs normal. Patient continued to experience spontaneous bleeding including gingival, vaginal, hematuria, as well as epistaxis. She was started on aygestin to suppress menses. After two weeks of maximum medical therapy there was still no response and surgery was consulted. Splenectomy was performed on 09/16/2015. She was transfused 4 units of blood and 4 units of platelets at the initiation of the procedure with no response in platelet count following surgery.  Of note patient was not immunized prior to surgery and remains at high risk for infection. She developed a fever on post-op day 1 and was treated with broad spectrum antibiotics. Infectious work up was negative other than a UTI and antibiotics were appropriately narrowed. Blood cultures remained negative and she was a-febrile for the remainder of her hospitalization. White blood cell count has continued to climb (37 on discharge) however this was felt to be a stress response from the bone marrow and there are no other signs or sources of active infection. Following surgery, patient began to complain of worsening HA, tinnitus, and blurry vision in the R eye. Serial CT scans and MRI were consistent with small, acute hemorrhage with edema and possible mass effect. Patient received Kcentra. There were no focal neurological deficits identified. The evening prior to discharge (7/4) patient received one dose of vincristine, however her platelets have failed to respond. Patient remains in critical condition with high risk for [facility] and intracranial hemorrhage. Last dose of amicar at 5:00am 7/5, last dose of decadron  IV 5:00am 7/5, last dose of aygestin at 11:00am 7/5, rituxan  9:00am 7/3, one  dose of vincristine  given 7/4 at 12:00pm. Patient should receive N-plate 635mcg today at 13:00.   2. Anemia: Patient experiencing active bleeding throughout hospitalization requiring transfusion. Sources include hematuria, epistaxis, gingival, vaginal, and intracranial hemorrhage. Last transfused 2 units of pRBC on 7/1.  Discharge Vitals:   BP 125/77 mmHg  Pulse 46  Temp(Src) 97.9 F (36.6 C) (Axillary)  Resp 23  Ht  (1.549 m)  Wt 234 lb (106.142 kg)  BMI 44.24 kg/m2  SpO2 96%  LMP 08/23/2015  Pertinent Labs, Studies, and Procedures:    Ref. Range 09/22/2015 05:09  WBC Latest Ref Range: 4.0-10.5 K/uL 37.0 (H)  RBC Latest Ref Range: 3.87-5.11 MIL/uL 3.19 (L)  Hemoglobin Latest Ref Range: 12.0-15.0 g/dL 8.9 (L)  HCT Latest Ref Range: 36.0-46.0 % 27.3 (L)  MCV Latest Ref Range: 78.0-100.0 fL 85.6  MCH Latest Ref Range: 26.0-34.0 pg 27.9  MCHC Latest Ref Range: 30.0-36.0 g/dL 04.5  RDW Latest Ref Range: 11.5-15.5 % 14.2  Platelets Latest Ref Range: 150-400 K/uL 6 (LL)  Neutrophils Latest Units: % 86  Lymphocytes Latest Units: %  7  Monocytes Relative Latest Units: % 6  Eosinophil Latest Units: % 0  Basophil Latest Units: % 1  NEUT# Latest Ref Range: 1.7-7.7 K/uL 31.8 (H)  Lymphocyte # Latest Ref Range: 0.7-4.0 K/uL 2.6  Monocyte # Latest Ref Range: 0.1-1.0 K/uL 2.2 (H)  Eosinophils Absolute Latest Ref Range: 0.0-0.7 K/uL 0.0  Basophils Absolute Latest Ref Range: 0.0-0.1 K/uL 0.4 (H)  RBC Morphology Unknown POLYCHROMASIA PRE...  WBC Morphology Unknown MILD LEFT SHIFT (...    09/20/2015 CT Head Wo Contrast IMPRESSION: Subtle foci of hyperdensity over the high posterior right parietal region which is stable with new hyperdense focus over the left sylvian fissure suggesting acute hemorrhage. Sulcal spaces overall as well as basal cisterns less prominent compared to previous exams possibly related to mild local mass effect/edema. MRI may be helpful for further  evaluation.   07/042017 MRI Head W Wo Contrast IMPRESSION: Study confirms a small amount of subarachnoid blood in the sylvian fissure and along the convexities left more than right. There could also be a very tiny subdural hematoma in the left frontal region measuring no more than 1 or 2 mm in thickness.  Unusual area of T2 and FLAIR signal within the inferior temporal tip on the left measuring 7 x 15 x 15 mm. This could represent chronic gliosis or could be edema related either trauma or mild venous congestion perhaps in conjunction with the subarachnoid hemorrhage. Low-grade neoplasm not excluded, but unlikely  Discharge Instructions:   Signed: Reymundo Pollarolyn Teneil Shiller, MD 09/22/2015, 11:39 AM   Pager: [phone]

## 2015-09-22 NOTE — Progress Notes (Signed)
6 Days Post-Op  Subjective: Comfortable Not eating much  Objective: Vital signs in last 24 hours: Temp:  [97.4 F (36.3 C)-99.2 F (37.3 C)] 97.9 F (36.6 C) (07/05 0700) Pulse Rate:  [44-71] 46 (07/05 0700) Resp:  [20-27] 23 (07/05 0700) BP: (98-125)/(62-96) 125/77 mmHg (07/05 0700) SpO2:  [96 %-98 %] 96 % (07/05 0700) Last BM Date: 09/21/15  Intake/Output from previous day: 07/04 0701 - 07/05 0700 In: 747.5 [Blood:747.5] Out: 3951 [Urine:3950; Stool:1] Intake/Output this shift:    Abdomen soft, incision clean  Lab Results:   Recent Labs  09/21/15 0550 09/22/15 0509  WBC 23.2* 37.0*  HGB 9.5* 8.9*  HCT 28.9* 27.3*  PLT <5* 6*   BMET  Recent Labs  09/20/15 0500 09/21/15 0550  NA 139 138  K 3.8 3.6  CL 95* 103  CO2 27 26  GLUCOSE 122* 127*  BUN <5* 6  CREATININE 0.50 0.51  CALCIUM 9.0 8.8*   PT/INR  Recent Labs  09/21/15 1647  LABPROT 16.7*  INR 1.34   ABG No results for input(s): PHART, HCO3 in the last 72 hours.  Invalid input(s): PCO2, PO2  Studies/Results: Ct Head Wo Contrast  09/20/2015  CLINICAL DATA:  Severe headache and blurred vision.  Tinnitus. EXAM: CT HEAD WITHOUT CONTRAST TECHNIQUE: Contiguous axial images were obtained from the base of the skull through the vertex without intravenous contrast. COMPARISON:  09/20/2015, 09/08/2015 and 08/28/2015 FINDINGS: Ventricles are within normal. Basal cisterns somewhat less well-defined compared to the previous exams. Examination again demonstrates subtle foci of hyperdensity over the high right posterior parietal region without significant change. The previously noted hyperdense focus over the high left posterior parietal region is not as well seen. There is possible new mild hyperdensity in the region of the left sylvian fissure as these findings suggest areas of acute subarachnoid hemorrhage. Possible subtle hyperdensity over the subarachnoid space bifrontal region. Sulcal spacers are less  prominent compared to the prior exams suggesting possible mild local mass effect/edema. No evidence of acute infarction and no midline shift. Remaining bones and soft tissues are unchanged. IMPRESSION: Subtle foci of hyperdensity over the high posterior right parietal region which is stable with new hyperdense focus over the left sylvian fissure suggesting acute hemorrhage. Sulcal spaces overall as well as basal cisterns less prominent compared to previous exams possibly related to mild local mass effect/edema. MRI may be helpful for further evaluation. These results were called by telephone at the time of interpretation on 09/20/2015 at 8:41 pm to Dr. Ron Agee, who verbally acknowledged these results. Electronically Signed   By: Elberta Fortis M.D.   On: 09/20/2015 20:42   Ct Head Wo Contrast  09/20/2015  CLINICAL DATA:  Persistent headaches EXAM: CT HEAD WITHOUT CONTRAST TECHNIQUE: Contiguous axial images were obtained from the base of the skull through the vertex without intravenous contrast. COMPARISON:  09/20/2015 FINDINGS: Bony calvarium is intact. No gross soft tissue abnormality is noted. Persistent areas of increased attenuation are noted bilaterally and along the falx similar to that seen on the prior exam. The overall appearance is stable. No new focus of hemorrhage is identified. No acute infarct or space-occupying mass lesion is noted. IMPRESSION: Stable areas of increased attenuation within the subarachnoid space bilaterally. No new focal abnormality is noted. Electronically Signed   By: Alcide Clever M.D.   On: 09/20/2015 09:14   Mr Laqueta Jean ZO Contrast  09/21/2015  CLINICAL DATA:  Headache. Tinnitus. Blurred vision. CT suggesting subarachnoid hemorrhaged EXAM: MRI HEAD  WITHOUT AND WITH CONTRAST TECHNIQUE: Multiplanar, multiecho pulse sequences of the brain and surrounding structures were obtained without and with intravenous contrast. CONTRAST:  18mL MULTIHANCE GADOBENATE DIMEGLUMINE 529 MG/ML IV  SOLN COMPARISON:  Multiple CT studies from 08/28/2015 through 09/20/2015. FINDINGS: Diffusion imaging does not show any acute or subacute infarction or other cause of restricted diffusion. The brainstem and cerebellum are normal. Cerebral hemispheres are normal except for a region of T2 and FLAIR signal in the inferior temporal tip on the left measuring approximately 15 x 15 x 7 mm. There is no susceptibility in this region. There is no contrast enhancement in this region. The nature of this is uncertain. The could represent chronic gliosis. A could represent acute edema related either trauma or perhaps minimal venous congestion. Low grade neoplasm would be theoretically possible but seemingly unlikely. No other abnormal parenchymal finding. The patient does have evidence of subarachnoid hemorrhage evidenced by susceptibility and some FLAIR signal notable in the sylvian fissure on the left and along the convexities, left more than right. There could possibly be a very tiny subdural hematoma in the left frontal region, no more than 2 mm in thickness. No abnormal enhancement occurs anywhere on the examination. No hydrocephalus. No pituitary mass. No inflammatory sinus disease. No skull or skullbase lesion. Major vessels at the base of the brain show flow. IMPRESSION: Study confirms a small amount of subarachnoid blood in the sylvian fissure and along the convexities left more than right. There could also be a very tiny subdural hematoma in the left frontal region measuring no more than 1 or 2 mm in thickness. Unusual area of T2 and FLAIR signal within the inferior temporal tip on the left measuring 7 x 15 x 15 mm. This could represent chronic gliosis or could be edema related either trauma or mild venous congestion perhaps in conjunction with the subarachnoid hemorrhage. Low-grade neoplasm not excluded, but unlikely. Electronically Signed   By: Paulina FusiMark  Shogry M.D.   On: 09/21/2015 18:45     Anti-infectives: Anti-infectives    Start     Dose/Rate Route Frequency Ordered Stop   09/18/15 0000  vancomycin (VANCOCIN) IVPB 1000 mg/200 mL premix  Status:  Discontinued     1,000 mg 200 mL/hr over 60 Minutes Intravenous Every 8 hours 09/17/15 0906 09/17/15 1709   09/17/15 2300  ceFEPIme (MAXIPIME) 2 g in dextrose 5 % 50 mL IVPB  Status:  Discontinued     2 g 100 mL/hr over 30 Minutes Intravenous Every 12 hours 09/17/15 1724 09/20/15 0816   09/17/15 1800  piperacillin-tazobactam (ZOSYN) IVPB 3.375 g  Status:  Discontinued     3.375 g 12.5 mL/hr over 240 Minutes Intravenous Every 8 hours 09/17/15 0906 09/17/15 1712   09/17/15 0900  piperacillin-tazobactam (ZOSYN) IVPB 3.375 g     3.375 g 100 mL/hr over 30 Minutes Intravenous STAT 09/17/15 0847 09/17/15 1207   09/17/15 0900  vancomycin (VANCOCIN) 2,000 mg in sodium chloride 0.9 % 500 mL IVPB     2,000 mg 250 mL/hr over 120 Minutes Intravenous  Once 09/17/15 0847 09/17/15 1337   09/16/15 0600  ceFAZolin (ANCEF) IVPB 2g/100 mL premix    Comments:  Pharmacy may adjust dosing strength, interval, or rate of medication as needed for optimal therapy for the patient  Send with patient on call to the OR.  Anesthesia to complete antibiotic administration <10360min prior [facility].   2 g 200 mL/hr over 30 Minutes Intravenous To Short Stay 09/15/15  1607 09/16/15 1025      Assessment/Plan: s/p Procedure(s): OPEN SPLENECTOMY (N/A)  Stable from surgical standpoint Continuing current care  LOS: 25 days    [name] A 09/22/2015

## 2015-09-22 NOTE — Progress Notes (Signed)
@  0225 Paged Peggyann Juba'Sullivan on call for IMTS with "Pt's HR SB 40s(dipped x2 to 39). BPs 90s-110s/60s (Maps 70s). Pt resting comfortably. Just letting you know. Thx, Matt"

## 2015-09-23 LAB — IMMUNOFIXATION ELECTROPHORESIS
IGM, SERUM: 123 mg/dL (ref 26–217)
IgA: 176 mg/dL (ref 87–352)
IgG (Immunoglobin G), Serum: 1077 mg/dL (ref 700–1600)
Total Protein ELP: 6 g/dL (ref 6.0–8.5)

## 2015-09-23 LAB — ANTINUCLEAR ANTIBODIES, IFA: ANTINUCLEAR ANTIBODIES, IFA: NEGATIVE

## 2015-09-28 ENCOUNTER — Other Ambulatory Visit: Payer: Self-pay | Admitting: Oncology

## 2015-09-28 ENCOUNTER — Telehealth: Payer: Self-pay | Admitting: *Deleted

## 2015-09-28 DIAGNOSIS — D693 Immune thrombocytopenic purpura: Secondary | ICD-10-CM

## 2015-09-28 NOTE — Telephone Encounter (Signed)
Talked to pt's daughter, who speaks [place], stated [facility] is [facility]  I called / informed Dr Cyndie ChimeGranfortuna who wanted me to give pt instruction to Short Stay for appt on Thursday. Dr Reece AgarG stated to call her back;let us know when pt is discharged. Spoke to daughter again; gave [facility]; will call us when her [facility].

## 2015-09-28 NOTE — Telephone Encounter (Signed)
Noted  Thanks  Please call Laverne in short stay if patient plans to keep appt for rx on thurs at short stay  I put in orders for N-plate  & Rituxan

## 2015-09-28 NOTE — Progress Notes (Unsigned)
Lady with refractory ITP admitted here 6/10-7/5  Transferred to [facility] at family request on 7/5. 1additional dose N-Plate given there on 7/5  I am not sure if she got 4th final planned dose of Rituxan   Platelet count started to recover & up to 140,000 by 7/10. She is being discharged today. I put in order for N-Plate to be given at [facility] stay this Thursday 7/13.  I also started her on vincristine  She received 2 mg IV on 7/4. I am waiting to hear from MDs at [facility] whether we should continue this. I will see the patient on my return 7/17 to coordinate further Rx.

## 2015-09-29 ENCOUNTER — Telehealth: Payer: Self-pay | Admitting: *Deleted

## 2015-09-29 NOTE — Telephone Encounter (Signed)
Called pt's daughter - stated pt is still in the [facility]; will not be discharged until Friday. Dr Cyndie ChimeGranfortuna informed; cancelled appt tomorrow for Rituxan and N-plate. Also informed Laverne at Short Stay. Pt has an appt scheduled 7/18, next Tuesday w/Dr G.

## 2015-09-30 ENCOUNTER — Ambulatory Visit (HOSPITAL_COMMUNITY): Admission: RE | Admit: 2015-09-30 | Payer: Self-pay | Source: Ambulatory Visit

## 2015-10-05 ENCOUNTER — Encounter: Payer: Self-pay | Admitting: Oncology

## 2015-10-05 ENCOUNTER — Ambulatory Visit (INDEPENDENT_AMBULATORY_CARE_PROVIDER_SITE_OTHER): Payer: Self-pay | Admitting: Oncology

## 2015-10-05 ENCOUNTER — Other Ambulatory Visit: Payer: Self-pay | Admitting: Oncology

## 2015-10-05 VITALS — BP 108/59 | HR 76 | Temp 98.4°F | Ht 60.0 in | Wt 217.3 lb

## 2015-10-05 DIAGNOSIS — Z23 Encounter for immunization: Secondary | ICD-10-CM

## 2015-10-05 DIAGNOSIS — I609 Nontraumatic subarachnoid hemorrhage, unspecified: Secondary | ICD-10-CM

## 2015-10-05 DIAGNOSIS — Z9081 Acquired absence of spleen: Secondary | ICD-10-CM

## 2015-10-05 DIAGNOSIS — K068 Other specified disorders of gingiva and edentulous alveolar ridge: Secondary | ICD-10-CM

## 2015-10-05 DIAGNOSIS — B181 Chronic viral hepatitis B without delta-agent: Secondary | ICD-10-CM

## 2015-10-05 DIAGNOSIS — D693 Immune thrombocytopenic purpura: Secondary | ICD-10-CM

## 2015-10-05 DIAGNOSIS — N920 Excessive and frequent menstruation with regular cycle: Secondary | ICD-10-CM

## 2015-10-05 DIAGNOSIS — B370 Candidal stomatitis: Secondary | ICD-10-CM

## 2015-10-05 DIAGNOSIS — H3563 Retinal hemorrhage, bilateral: Secondary | ICD-10-CM | POA: Insufficient documentation

## 2015-10-05 DIAGNOSIS — Z8619 Personal history of other infectious and parasitic diseases: Secondary | ICD-10-CM

## 2015-10-05 DIAGNOSIS — I616 Nontraumatic intracerebral hemorrhage, multiple localized: Secondary | ICD-10-CM

## 2015-10-05 HISTORY — DX: Retinal hemorrhage, bilateral: H35.63

## 2015-10-05 LAB — CBC WITH DIFFERENTIAL/PLATELET
BASOS PCT: 0 %
Basophils Absolute: 0 10*3/uL (ref 0.0–0.1)
EOS PCT: 0 %
Eosinophils Absolute: 0 10*3/uL (ref 0.0–0.7)
HEMATOCRIT: 33.8 % — AB (ref 36.0–46.0)
HEMOGLOBIN: 10.9 g/dL — AB (ref 12.0–15.0)
Lymphocytes Relative: 3 %
Lymphs Abs: 0.6 10*3/uL — ABNORMAL LOW (ref 0.7–4.0)
MCH: 29.3 pg (ref 26.0–34.0)
MCHC: 32.2 g/dL (ref 30.0–36.0)
MCV: 90.9 fL (ref 78.0–100.0)
MONO ABS: 0.8 10*3/uL (ref 0.1–1.0)
Monocytes Relative: 4 %
NEUTROS PCT: 93 %
Neutro Abs: 19 10*3/uL — ABNORMAL HIGH (ref 1.7–7.7)
Platelets: 270 10*3/uL (ref 150–400)
RBC: 3.72 MIL/uL — ABNORMAL LOW (ref 3.87–5.11)
RDW: 19.7 % — ABNORMAL HIGH (ref 11.5–15.5)
WBC: 20.4 10*3/uL — ABNORMAL HIGH (ref 4.0–10.5)

## 2015-10-05 MED ORDER — HAEMOPHILUS B POLYSAC CONJ VAC IM SOLR
0.5000 mL | Freq: Once | INTRAMUSCULAR | Status: AC
  Administered 2015-10-05: 0.5 mL via INTRAMUSCULAR

## 2015-10-05 MED ORDER — PNEUMOCOCCAL 13-VAL CONJ VACC IM SUSP
0.5000 mL | INTRAMUSCULAR | Status: AC
  Administered 2015-10-05: 0.5 mL via INTRAMUSCULAR

## 2015-10-05 MED ORDER — FLUCONAZOLE 100 MG PO TABS: ORAL_TABLET | ORAL | Status: AC

## 2015-10-05 MED ORDER — FERROUS SULFATE 325 (65 FE) MG PO TABS: 325.0000 mg | ORAL_TABLET | Freq: Every day | ORAL | Status: DC

## 2015-10-05 NOTE — Progress Notes (Signed)
Patient ID: Martavia Tye, female   DOB: 1975/09/22, 40 y.o.   MRN: [id number] Hematology and Oncology Follow Up Visit  Avree Szczygiel [id number] 1975/05/28 39 y.o. 10/05/2015 5:13 PM   Principle Diagnosis: Encounter Diagnoses  Name Primary?  . Acute ITP (HCC) Yes  . Nontraumatic multiple localized intracerebral hemorrhages, unspecified laterality (HCC)   . Chronic hepatitis B (HCC)   . Nontraumatic subarachnoid hemorrhage (HCC)   . Menorrhagia with regular cycle   . ITP (idiopathic thrombocytopenic purpura)   . Retinal hemorrhage of both eyes   . Gingival bleeding   . Post-splenectomy   Patient interviewed and examined with the assistance of a certified translator by Skype  Interim History:   First post hospital visit for this complicated 40 year old Hispanic woman who presented to the hospital on 08/28/2015 with acute onset of gum bleeding and intermittent headache. She was on no chronic medications. No history of infection, recent travel, or exposures. No alcohol. No illicit drugs. She was using herbal life milkshakes for about 3 weeks which she purchased at her gym in an attempt to lose weight. She had no signs or symptoms of a collagen vascular disease. Prior testing for HIV, hepatitis B surface antigen, and RPR were negative. Blood type oh. Rh+. On initial exam weight 103 kg. Afebrile. Profuse gum bleeding. No lymphadenopathy or organomegaly. No focal neurologic deficits. Diffuse petechial rash. CBC with hemoglobin 12, hematocrit 37, MCV 85, white count 5800, 66% neutrophils, 24 lymphocytes, 8 monocytes, 1 eosinophil. Platelets were undetectable. Review of peripheral blood confirms severe thrombocytopenia with less than 1 platelet per 10 high power fields. No spherocytes, schistocytes, or polychromasia. Direct Coombs test was negative. Pro time 15.5 seconds lab normal up to 15.2, PTT 39 seconds lab normal up to 37. Fibrinogen 351. D-dimer less than 0.27. Haptoglobin 127.  Reticulocyte 1.2%. LDH 185. Bilirubin 0.7. BUN 12. Creatinine 0.6. SGOT 21. SGPT 42. TSH 3.9. Urine pregnancy test negative. HIV negative. Hepatitis C antibody and quantitative RNA negative. Hepatitis B surface antigen negative, surface antibody positive, e antigen negative, e antibody positive. ANA negative. A chest radiograph was unremarkable. CT scan of the brain showed some small areas in the posterior parietal lobes consistent with low-grade subarachnoid hemorrhage. Review of previous medical records revealed that 3 years ago when she was pregnant with her sixth child,  platelet count was recorded at 136,000. No follow-up counts done until this admission.  Hospital course: She received an initial course of high-dose dexamethasone 40 mg daily 4 days and intravenous immunoglobulin 1 g/kg daily 2 doses. By hospital day 6 there was no sign of response. She continued to have profuse oral mucosal and gum bleeding. She began to have vaginal bleeding. She was started on oral Amicar initially 2 g every 6 hours. Aygestin hormonal suppression of her menses 5 mg daily. She was started on a course of Rituxan 375 mg/m weekly and received 3 total doses while hospitalized. She was started on concomitant weekly Romiplostim initial dose of 1 mcg/kg escalated at week 2 to 2 mcg/kg, then 4 mcg/kg week 3 with the third dose given 2 days early in view of planned splenectomy since no response at that point and continued bleeding with extension of focal intracranial hemorrhage seen on imaging studies hospital day 13. Post splenectomy, in view of persistent marked thrombocytopenia, recurrent headache, visual changes, and signs of extension of intracranial hemorrhage, parenteral Decadron reinitiated. Amicar changed to IV 4 g every 4 hours. A dose of 4 factor clotting concentrate, K centra  given at time of extension of intracranial bleed. No extension of bleeding 6 hours later but suspicion for further extension approximately  12 hours later. Platelet transfusion given followed by an immediate dose of recombinant factor VIIA. Splenectomy performed hospital day 19 on 09/16/2015. 4 units of platelets and packed red cells given immediately preop. No complications. No rise in platelet count with transfusions. No bleeding from the wound despite failure of platelets to recover. 5 days postoperatively vincristine 2 mg given IV on July 4. Family became increasingly anxious and requested transfer to [facility]. She was transferred to [facility] in [place] on July 5. She received a scheduled dose of Romiplostim at 6 mcg/kg that day. A bone marrow aspiration and biopsy was done. No aspirate was obtained. Biopsy showed megakaryocyte hyperplasia and early fibrosis with some mild dysplastic changes in the neutrophils. Molecular studies were negative for JAK-2 & Calretinin mutations. BCR-ABL studies pending. On the day following transfer, platelet count detectable at 7000 on July 6, rose to 14,000 on July 7, 46,000 on July 8, with subsequent rapid rise up to 265,000 by July 12. She was discharged on July 14. Platelet count 361,000. Hemoglobin 10, hematocrit 31, white count 18,500 with 86% neutrophils, 4 lymphocytes. Most recent dose of Romiplostim 4 mcg/kg given on July 13. Amicar was stopped. Aygestin was continued. Additional doses of vincristine or Rituxan were not given. Decadron was tapered and at discharge dose 4 mg twice daily for 6 days then stop.      Medications: reviewed  Allergies: No Known Allergies  Review of Systems: She is complaining of decreased vision in her left eye which she has noted since the day of the splenectomy. She has a pulling sensation and persistent postoperative pain in her left upper quadrant. Oropharyngeal bleeding has stopped. Vaginal bleeding has stopped.  Physical Exam: Blood pressure 108/59, pulse 76, temperature 98.4 F (36.9 C), temperature source Oral, height 5'  (1.524 m), weight 217 lb 4.8 oz (98.567 kg), SpO2 100 %, currently breastfeeding. Wt Readings from Last 3 Encounters:  10/05/15 217 lb 4.8 oz (98.567 kg)  09/02/15 234 lb (106.142 kg)  04/19/12 224 lb 12.8 oz (101.969 kg)     General appearance: Pleasant, obese, Hispanic woman HENNT: Pharynx no erythema, extensive Candida exudate , no mass, or ulcer. No thyromegaly or thyroid nodules Lymph nodes: No cervical, supraclavicular, or axillary lymphadenopathy Breasts: Lungs: Clear to auscultation, resonant to percussion throughout Heart: Regular rhythm, no murmur, no gallop, no rub, no click, no edema Abdomen: Obese, Soft, nontender, normal bowel sounds, no mass, no hepato-megaly. Healing scar of left upper quadrant status post splenectomy Extremities: No edema, no calf tenderness Musculoskeletal: no joint deformities GU:  Vascular: Carotid pulses 2+, no bruits, Neurologic: Alert, oriented, PERRLA, optic discs sharp  Bilateral retinal hemorrhages extensive on the left minimal to moderate on the right  cranial nerves grossly normal, motor strength 5 over 5, reflexes 1+ symmetric, upper body coordination normal, gait normal, Skin: Resolved petechial rash; no new ecchymosis  Lab Results: CBC W/Diff    Component Value Date/Time   WBC 20.4* 10/05/2015 1423   RBC 3.72* 10/05/2015 1423   RBC 4.24 08/28/2015 1758   HGB 10.9* 10/05/2015 1423   HCT 33.8* 10/05/2015 1423   HCT 27.3* 09/21/2015 0900   PLT 270 10/05/2015 1423   MCV 90.9 10/05/2015 1423   MCH 29.3 10/05/2015 1423   MCHC 32.2 10/05/2015 1423   RDW 19.7* 10/05/2015 1423   LYMPHSABS 0.6* 10/05/2015 1423  MONOABS 0.8 10/05/2015 1423   EOSABS 0.0 10/05/2015 1423   BASOSABS 0.0 10/05/2015 1423     Chemistry      Component Value Date/Time   NA 138 09/21/2015 0550   K 3.6 09/21/2015 0550   CL 103 09/21/2015 0550   CO2 26 09/21/2015 0550   BUN 6 09/21/2015 0550   CREATININE 0.51 09/21/2015 0550      Component Value Date/Time    CALCIUM 8.8* 09/21/2015 0550   ALKPHOS 49 09/05/2015 0538   AST 19 09/05/2015 0538   ALT 27 09/05/2015 0538   BILITOT 0.2* 09/05/2015 0538       Radiological Studies: Ct Head Wo Contrast  09/20/2015  CLINICAL DATA:  Severe headache and blurred vision.  Tinnitus. EXAM: CT HEAD WITHOUT CONTRAST TECHNIQUE: Contiguous axial images were obtained from the base of the skull through the vertex without intravenous contrast. COMPARISON:  09/20/2015, 09/08/2015 and 08/28/2015 FINDINGS: Ventricles are within normal. Basal cisterns somewhat less well-defined compared to the previous exams. Examination again demonstrates subtle foci of hyperdensity over the high right posterior parietal region without significant change. The previously noted hyperdense focus over the high left posterior parietal region is not as well seen. There is possible new mild hyperdensity in the region of the left sylvian fissure as these findings suggest areas of acute subarachnoid hemorrhage. Possible subtle hyperdensity over the subarachnoid space bifrontal region. Sulcal spacers are less prominent compared to the prior exams suggesting possible mild local mass effect/edema. No evidence of acute infarction and no midline shift. Remaining bones and soft tissues are unchanged. IMPRESSION: Subtle foci of hyperdensity over the high posterior right parietal region which is stable with new hyperdense focus over the left sylvian fissure suggesting acute hemorrhage. Sulcal spaces overall as well as basal cisterns less prominent compared to previous exams possibly related to mild local mass effect/edema. MRI may be helpful for further evaluation. These results were called by telephone at the time of interpretation on 09/20/2015 at 8:41 pm to Dr. Ron Agee, who verbally acknowledged these results. Electronically Signed   By: Elberta Fortis M.D.   On: 09/20/2015 20:42   Ct Head Wo Contrast  09/20/2015  CLINICAL DATA:  Persistent headaches EXAM: CT  HEAD WITHOUT CONTRAST TECHNIQUE: Contiguous axial images were obtained from the base of the skull through the vertex without intravenous contrast. COMPARISON:  09/20/2015 FINDINGS: Bony calvarium is intact. No gross soft tissue abnormality is noted. Persistent areas of increased attenuation are noted bilaterally and along the falx similar to that seen on the prior exam. The overall appearance is stable. No new focus of hemorrhage is identified. No acute infarct or space-occupying mass lesion is noted. IMPRESSION: Stable areas of increased attenuation within the subarachnoid space bilaterally. No new focal abnormality is noted. Electronically Signed   By: Alcide Clever M.D.   On: 09/20/2015 09:14   Ct Head Wo Contrast  09/20/2015  CLINICAL DATA:  Initial evaluation for worsening headaches with pain in status. ITP patient. EXAM: CT HEAD WITHOUT CONTRAST TECHNIQUE: Contiguous axial images were obtained from the base of the skull through the vertex without intravenous contrast. COMPARISON:  Prior CT from 09/08/2015. FINDINGS: Cerebral volume within normal limits for patient age. No significant white matter disease present. Previously noted tiny subcortical hyperdensities along the gray-white matter differentiation within the bilateral parietal regions again seen, stable. There is new a subtle serpiginous hyperdensity along the left sylvian fissure, suspicious for a small amount of acute subarachnoid hemorrhage (series 2, image 13).  This is new from previous. Additionally, there may be faint subarachnoid hemorrhage within the left frontal lobe more superiorly (series 2, image 17). Small amount may be present posteriorly within the left parietal lobe as well (series 2, image 17). Trace subarachnoid suspected within the anterior right frontal lobe (series 2, image 20). Possible trace subarachnoid along the falx (series 2, image 21). Possible trace thin subdural anteriorly along the falx, not entirely certain (series 2,  image 24). No frank parenchymal hemorrhage. No intraventricular blood. No extra-axial fluid collection. No mass lesion, midline shift, or mass effect. No hydrocephalus. No acute large vessel territory infarct. Gray-white matter differentiation maintained. Scalp soft tissues within normal limits. No acute abnormality about the globes and orbits. Mucosal thickening within the right maxillary sinus. Mucosal thickening with fluid levels within the sphenoid sinuses and posterior left ethmoidal air cells. Paranasal sinuses are otherwise largely clear. No mastoid effusion. Calvarium intact. IMPRESSION: 1. Subtle serpiginous hyperdensities at the left sylvian fissure, bilateral frontal lobes, left parietal lobe, and along the falx, suspicious for small volume acute subarachnoid hemorrhage. This is new relative to most recent CT from 09/08/2015. 2. Probable trace subdural blood along the falx without significant mass effect. 3. Fluid levels within the sphenoid sinuses, suggestive of acute sinusitis. Critical Value/emergent results were called by telephone at the time of interpretation on 09/20/2015 at 4:19 am to Dr. Candis Shine , who verbally acknowledged these results. Electronically Signed   By: Rise Mu M.D.   On: 09/20/2015 04:20   Ct Head Wo Contrast  09/08/2015  CLINICAL DATA:  40 year old female with uncontrolled and gingival bleeding. History of subarachnoid hemorrhage. EXAM: CT HEAD WITHOUT CONTRAST TECHNIQUE: Contiguous axial images were obtained from the base of the skull through the vertex without intravenous contrast. COMPARISON:  Multiple priors, most recently head CT 08/29/2015. FINDINGS: There continue to be some very subtle hyperdensities in the high parietal regions bilaterally, very similar to prior study 08/29/2015. Based on correlation with coronal and sagittal reformations, these appear to occur at the gray-white interface, rather than being within the sulci. Accordingly, these are favored  to represent areas of dystrophic calcification, and are of uncertain etiology (potential related to prior neurocysticercosis). No new signs of intra or extra-axial hemorrhage are identified. No acute intracranial abnormalities. Specifically, no definite findings of acute/subacute cerebral ischemia, no mass, mass effect, hydrocephalus or abnormal intra or extra-axial fluid collections. Visualized paranasal sinuses and mastoids are well pneumatized. No acute displaced skull fractures are identified. IMPRESSION: 1. No evidence of acute intracranial hemorrhage. 2. Persistent subtle areas of hyperdensity which appear to be associated with the gray-white interface in the high parietal regions bilaterally, not favored to be related to hemorrhage at this time. 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 Electronically Signed   By: Trudie Reed M.D.   On: 09/08/2015 08:53   Mr Laqueta Jean ON Contrast  09/21/2015  CLINICAL DATA:  Headache. Tinnitus. Blurred vision. CT suggesting subarachnoid hemorrhaged EXAM: MRI HEAD WITHOUT AND WITH CONTRAST TECHNIQUE: Multiplanar, multiecho pulse sequences of the brain and surrounding structures were obtained without and with intravenous contrast. CONTRAST:  18mL MULTIHANCE GADOBENATE DIMEGLUMINE 529 MG/ML IV SOLN COMPARISON:  Multiple CT studies from 08/28/2015 through 09/20/2015. FINDINGS: Diffusion imaging does not show any acute or subacute infarction or other cause of restricted diffusion. The brainstem and cerebellum are normal. Cerebral hemispheres are normal except for a region of T2 and FLAIR signal in the inferior temporal tip on the left measuring approximately  15 x 15 x 7 mm. There is no susceptibility in this region. There is no contrast enhancement in this region. The nature of this is uncertain. The could represent chronic gliosis. A could represent acute edema related either trauma or perhaps minimal venous congestion. Low  grade neoplasm would be theoretically possible but seemingly unlikely. No other abnormal parenchymal finding. The patient does have evidence of subarachnoid hemorrhage evidenced by susceptibility and some FLAIR signal notable in the sylvian fissure on the left and along the convexities, left more than right. There could possibly be a very tiny subdural hematoma in the left frontal region, no more than 2 mm in thickness. No abnormal enhancement occurs anywhere on the examination. No hydrocephalus. No pituitary mass. No inflammatory sinus disease. No skull or skullbase lesion. Major vessels at the base of the brain show flow. IMPRESSION: Study confirms a small amount of subarachnoid blood in the sylvian fissure and along the convexities left more than right. There could also be a very tiny subdural hematoma in the left frontal region measuring no more than 1 or 2 mm in thickness. Unusual area of T2 and FLAIR signal within the inferior temporal tip on the left measuring 7 x 15 x 15 mm. This could represent chronic gliosis or could be edema related either trauma or mild venous congestion perhaps in conjunction with the subarachnoid hemorrhage. Low-grade neoplasm not excluded, but unlikely. Electronically Signed   By: Paulina Fusi M.D.   On: 09/21/2015 18:45   Dg Chest Port 1 View  09/18/2015  CLINICAL DATA:  Fever EXAM: PORTABLE CHEST 1 VIEW COMPARISON:  09/16/2015 chest radiograph. FINDINGS: Right internal jugular central venous catheter terminates in the lower third of the superior vena cava. Stable cardiomediastinal silhouette with top-normal heart size. No pneumothorax. No pleural effusion. Low lung volumes. Platelike atelectasis in the left mid lung. Faint patchy opacities at the left lung base. IMPRESSION: 1. Faint patchy left lung base opacities, cannot exclude a developing pneumonia. 2. Platelike atelectasis in the left mid lung.  Low lung volumes. Electronically Signed   By: Delbert Phenix M.D.   On: 09/18/2015  13:25   Dg Chest Port 1 View  09/16/2015  CLINICAL DATA:  eval for placement of right central line - post op splenectomy EXAM: PORTABLE CHEST 1 VIEW COMPARISON:  12/04/2010 FINDINGS: The heart is mildly enlarged. There is pulmonary vascular congestion. No overt alveolar edema is identified. Right IJ central line has been placed, tip overlying the level of superior vena cava. No evidence for pneumothorax. IMPRESSION: Interval placement of right IJ central line.  No pneumothorax. Pulmonary vascular congestion noted. Electronically Signed   By: Norva Pavlov M.D.   On: 09/16/2015 13:40    Impression:  #1. Refractory ITP with clinical course as summarized above. She has now achieved a complete response.  Plan: We administered triple vaccines today: Pneumovax-13 with plan for Pneumovax-23 in 2 months., Meningococcus vaccine and Haemophilus influenza vaccines. She was not vaccinated preoperatively in view of severe thrombocytopenia to avoid uncontrollable hematoma formation from IM injections.  I have scheduled her fourth and final planned dose of Rituxan for this Thursday, July 20. I will continue Romiplostim at 4 mcg/kg weekly and taper as tolerated I will not give additional doses of vincristine at this time. I am stopping the Aygestin. Patient alerted to the fact that she may get some breakthrough bleeding.  With respect to bilateral retinal hemorrhages, I have been in touch with Dr. Georges Mouse at [facility]  who has graciously agreed to see the patient at 8 AM tomorrow morning. Now that her platelet count has recovered, the hemorrhages should slowly resolve on their own.  #2. oral pharyngeal Candida, I am prescribing Diflucan 200 mg today then 100 mg daily to complete 1 week. She will be off all steroids by then.  #3. Evidence for previous hepatitis B infection which resolved on its own.  CC: Patient Care Team: No Pcp Per Patient as PCP - General (General  Practice)   Levert FeinsteinJAMES M Terese Heier, MD 7/18/20175:13 PM

## 2015-10-05 NOTE — Patient Instructions (Addendum)
Stop Aygestin Continue dexamethasone steroid for 2 more days Take fluconazole 100 mg (diflucan) 2 tablets today then 1 tablet daily for another 6 days for yeast infection in throat Start iron pills 1 daily ferrous sulfate  [facility] on second floor this Thursday at 7:45 AM for injection of N-Plate and a dose of Rituxan. This is last dose of Rituxan that you need to get but we will continue the N-Plate once a week for now. We will check a platelet count every Thursday when you come in for the injection.  Will will make a referral to an eye doctor  MD visit  On 8/1 with Dr Cyndie ChimeGranfortuna

## 2015-10-06 ENCOUNTER — Other Ambulatory Visit: Payer: Self-pay | Admitting: Oncology

## 2015-10-06 DIAGNOSIS — D693 Immune thrombocytopenic purpura: Secondary | ICD-10-CM

## 2015-10-07 ENCOUNTER — Ambulatory Visit (HOSPITAL_COMMUNITY)
Admission: RE | Admit: 2015-10-07 | Discharge: 2015-10-07 | Disposition: A | Discharge status: Home / Self Care | Payer: Self-pay | Source: Ambulatory Visit | Attending: Oncology | Admitting: Oncology

## 2015-10-07 ENCOUNTER — Encounter: Payer: Self-pay | Admitting: Oncology

## 2015-10-07 VITALS — BP 97/57 | HR 90 | Temp 97.8°F | Resp 20 | Ht 61.0 in | Wt 211.4 lb

## 2015-10-07 DIAGNOSIS — D693 Immune thrombocytopenic purpura: Secondary | ICD-10-CM | POA: Insufficient documentation

## 2015-10-07 LAB — CBC WITH DIFFERENTIAL/PLATELET
BASOS ABS: 0 10*3/uL (ref 0.0–0.1)
Basophils Relative: 0 %
Eosinophils Absolute: 0 10*3/uL (ref 0.0–0.7)
Eosinophils Relative: 0 %
HCT: 37.8 % (ref 36.0–46.0)
Hemoglobin: 12.2 g/dL (ref 12.0–15.0)
LYMPHS ABS: 0.6 10*3/uL — AB (ref 0.7–4.0)
Lymphocytes Relative: 4 %
MCH: 29.4 pg (ref 26.0–34.0)
MCHC: 32.3 g/dL (ref 30.0–36.0)
MCV: 91.1 fL (ref 78.0–100.0)
MONO ABS: 0.5 10*3/uL (ref 0.1–1.0)
Monocytes Relative: 3 %
Neutro Abs: 14.8 10*3/uL — ABNORMAL HIGH (ref 1.7–7.7)
Neutrophils Relative %: 93 %
PLATELETS: 235 10*3/uL (ref 150–400)
RBC: 4.15 MIL/uL (ref 3.87–5.11)
RDW: 20.4 % — AB (ref 11.5–15.5)
WBC: 15.9 10*3/uL — AB (ref 4.0–10.5)

## 2015-10-07 MED ORDER — DIPHENHYDRAMINE HCL 50 MG/ML IJ SOLN
25.0000 mg | Freq: Once | INTRAMUSCULAR | Status: AC
  Administered 2015-10-07: 25 mg via INTRAVENOUS

## 2015-10-07 MED ORDER — ROMIPLOSTIM 250 MCG ~~LOC~~ SOLR
4.0000 ug/kg | Freq: Once | SUBCUTANEOUS | Status: AC
  Administered 2015-10-07: 395 ug via SUBCUTANEOUS
  Filled 2015-10-07: qty 0.79

## 2015-10-07 MED ORDER — DIPHENHYDRAMINE HCL 50 MG/ML IJ SOLN
INTRAMUSCULAR | Status: AC
  Administered 2015-10-07: 25 mg via INTRAVENOUS
  Filled 2015-10-07: qty 1

## 2015-10-07 MED ORDER — SODIUM CHLORIDE 0.9 % IV SOLN
375.0000 mg/m2 | Freq: Once | INTRAVENOUS | Status: AC
  Administered 2015-10-07: 800 mg via INTRAVENOUS
  Filled 2015-10-07: qty 80

## 2015-10-07 MED ORDER — ACETAMINOPHEN 325 MG PO TABS
650.0000 mg | ORAL_TABLET | Freq: Once | ORAL | Status: AC
  Administered 2015-10-07: 650 mg via ORAL

## 2015-10-07 MED ORDER — ACETAMINOPHEN 325 MG PO TABS
ORAL_TABLET | ORAL | Status: AC
  Filled 2015-10-07: qty 2

## 2015-10-07 MED ORDER — DIPHENHYDRAMINE HCL 25 MG PO CAPS
ORAL_CAPSULE | ORAL | Status: AC
  Filled 2015-10-07: qty 1

## 2015-10-07 NOTE — Progress Notes (Unsigned)
Patient ID: [name], female   DOB: 03/31/1975, 40 y.o.   MRN: [id number] Patient seen and examined in short stay unit. See office note from 7/18 for full details. She tolerated triple vaccinations with Pneumovax, meningitis vaccine, & H Influenzae vaccines with incident. She was not able to make ophthalmology appt yesterday due to transportation issues. Platelet count today drifting down from peak value last week but still excellent at 235,000. Hemoglobin rising. Oropharyngeal candida resolving on Diflucan. Lungs clear; heart regular Impression:  Severe ITP Low grade intracerbral hemorrhage, extensive retinal hemorrhage, menorrhagia, oropharyngeal hemorrhage now resolved Delayed but ultimately complete response to aggressive multi-modality Treatments. Plan:  Stable to proceed withdose #6 Romiplostim @ 4 mcg/kg & Rituxan 375 mg/m2 dose 4 of 4  Today Continue weekly N-Plate and CBC. Patient advised to re-schedule appt w eye MD.  Translator present during interview & exam.  Cephas DarbyJames Williette Loewe, MD, FACP  Hematology-Oncology/Internal Medicine

## 2015-10-11 ENCOUNTER — Telehealth: Payer: Self-pay | Admitting: Oncology

## 2015-10-11 NOTE — Telephone Encounter (Signed)
Having vaginal bleeding need to talk to nurse

## 2015-10-11 NOTE — Telephone Encounter (Signed)
Pt states she has for 2 days had bleeding when she urinates and when she cleans herself afterwards, some frequency noted, denies burning, urgency and abd pain. appt given for Tuesday 7/25 at 0815 [facility]

## 2015-10-11 NOTE — Telephone Encounter (Signed)
She was advised at time of office visit she might experience some breakthrough vaginal bleeding when she came off Aygestin estrogen. We will check platelet count again this Thursday when she comes for for N-Plate injection at short stay. If you put her in to see one of our [facility] docs tomorrow, we can check a CBC

## 2015-10-12 ENCOUNTER — Telehealth: Payer: Self-pay | Admitting: *Deleted

## 2015-10-12 ENCOUNTER — Ambulatory Visit (INDEPENDENT_AMBULATORY_CARE_PROVIDER_SITE_OTHER): Payer: Self-pay | Admitting: Internal Medicine

## 2015-10-12 ENCOUNTER — Encounter: Payer: Self-pay | Admitting: Internal Medicine

## 2015-10-12 ENCOUNTER — Other Ambulatory Visit: Payer: Self-pay | Admitting: Oncology

## 2015-10-12 VITALS — BP 93/47 | HR 86 | Temp 98.2°F | Ht 60.0 in | Wt 212.5 lb

## 2015-10-12 DIAGNOSIS — D693 Immune thrombocytopenic purpura: Secondary | ICD-10-CM

## 2015-10-12 DIAGNOSIS — N390 Urinary tract infection, site not specified: Secondary | ICD-10-CM

## 2015-10-12 DIAGNOSIS — Z9081 Acquired absence of spleen: Secondary | ICD-10-CM

## 2015-10-12 DIAGNOSIS — H3563 Retinal hemorrhage, bilateral: Secondary | ICD-10-CM

## 2015-10-12 LAB — CBC WITH DIFFERENTIAL/PLATELET
Basophils Absolute: 0 10*3/uL (ref 0.0–0.1)
Basophils Relative: 0 %
EOS PCT: 1 %
Eosinophils Absolute: 0.1 10*3/uL (ref 0.0–0.7)
HEMATOCRIT: 39.2 % (ref 36.0–46.0)
Hemoglobin: 12.2 g/dL (ref 12.0–15.0)
LYMPHS ABS: 0.9 10*3/uL (ref 0.7–4.0)
LYMPHS PCT: 11 %
MCH: 29.4 pg (ref 26.0–34.0)
MCHC: 31.1 g/dL (ref 30.0–36.0)
MCV: 94.5 fL (ref 78.0–100.0)
MONO ABS: 0.3 10*3/uL (ref 0.1–1.0)
Monocytes Relative: 4 %
NEUTROS ABS: 7 10*3/uL (ref 1.7–7.7)
Neutrophils Relative %: 84 %
PLATELETS: 183 10*3/uL (ref 150–400)
RBC: 4.15 MIL/uL (ref 3.87–5.11)
RDW: 20.3 % — ABNORMAL HIGH (ref 11.5–15.5)
WBC: 8.4 10*3/uL (ref 4.0–10.5)

## 2015-10-12 LAB — POCT URINALYSIS DIPSTICK
Bilirubin, UA: NEGATIVE
GLUCOSE UA: NEGATIVE
KETONES UA: NEGATIVE
Nitrite, UA: POSITIVE
Protein, UA: 30
SPEC GRAV UA: 1.025
Urobilinogen, UA: 0.2
pH, UA: 7

## 2015-10-12 NOTE — Progress Notes (Signed)
Internal Medicine Clinic Attending  Case discussed with Dr. Saraiya at the time of the visit.  We reviewed the resident's history and exam and pertinent patient test results.  I agree with the assessment, diagnosis, and plan of care documented in the resident's note.  

## 2015-10-12 NOTE — Assessment & Plan Note (Addendum)
A: Patient is here for follow up since last Thursday when she received the weekly Nplate injections. She has a complex medical history and was hospitalized for severe refractory ITP when she presented to the ER with spontaneous gingival bleeding, rash, and severely low platelets. A head CT had shown spontaneous ICH. Received decadron, amicar, rituximab, and thrombopoeitin, and was started on Aygestin to decrease menses. She underwent splenectomy on 6/29, and post-surgery she had a small subarachnoid hemorrhage.  She has been following with Dr. Cyndie Chime and goes to weekly short stay for her CBC and N-plate.  She complains of vaginal bleeding Since Sunday and saw some blood on the toilet paper . She reports that the irregular menses started since October when she was put on Mirena. The Mirena fell out while she was hospitalized few weeks ago. She does not see OBGYN.   She did not know that some breakthrough bleeding was to be expected following stopping of norethindrone.   On exam, no other sites of bleeding, no rash. Bilateral retinal hemorrhages on fundoscopic exam. Has well healed surgical scar on abdomen.   CBC obtained which showed HgB of 12.2, and platelets of 183, which are a little low from 235. WC of 8.4. UA WNL   Plan -Discussed with both Dr Mikey Bussing and dr Cyndie Chime. - the bleeding is to be expected following the cessation of norethindrone  -advised to follow up with Opthalmology  -advised to continue weekly short stay visits for Nplate and CBC every Thursday -advised to follow up with surgery for post-op visit

## 2015-10-12 NOTE — Patient Instructions (Addendum)
Thank you for your visit today Please continue to go to short stay for your weekly romiplostim- every thursday  Please follow up with Dr. Cyndie Chime Please FOLLOW UP WITH THE EYE DOCTOR Please follow up with the surgeon for your post-op visit and to remove the sutures Please return to clinic if your symptoms worsen, or if your bleeding worsens

## 2015-10-12 NOTE — Telephone Encounter (Signed)
Called pt - talked to pt's daughter, who speaks English, need to schedule another appt for N-plate which is weekly x 1 month per Dr Cyndie Chime.  Appt scheduled with La[place]ne at [facility] for this Thursday at 0830 AM. Pt instructed to come to Admission ; arrive 15 min early as before (per daughter).

## 2015-10-12 NOTE — Progress Notes (Signed)
    CC: bleeding HPI: Ms.[name] is a 40 y.o. woman with PMH noted below here for concern about vaginal bleeding for 3 days  Please see Problem List/A&P for the status of the patient's chronic medical problems   Past Medical History:  Diagnosis Date  . Depression   . 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    Review of Systems:  Denies fevers, chills Denies dyspnea Denies abd pain but feels numbness  Has dizziness Has decreased/blurry vision in left eye- unchanged Saw blood on the toilet paper Denies any gingival bleeding or rash   Physical Exam: Vitals:   10/12/15 0835 10/12/15 0836  BP:  (!) 93/47  Pulse:  86  Temp: 98.2 F (36.8 C)   TempSrc: Oral   SpO2:  100%  Weight: 212 lb 8 oz (96.4 kg)   Height: 5' (1.524 m)     General: A&O, in NAD, obese HEENT: MMM, normal gingival- no signs of bleeding     Fundoscopic exam- has bilateral retinal hemorrhage L > R Neck: supple, midline trachea, no cervical lymphadenopathy  CV: RRR, normal s1, s2, no m/r/g Resp: equal and symmetric breath sounds, no wheezing or crackles  Abdomen: soft, obese, nontender- has well-healed surgical scar on left abdomen at the spleen site- sutures in place Skin: warm, dry, intact, no rash noted Neurologic: no focal neurological deficits   Assessment & Plan:   See encounters tab for problem based medical decision making. Patient discussed with Dr. Bea Laura. Hoffman/ Dr. Cyndie Chime

## 2015-10-14 ENCOUNTER — Encounter (HOSPITAL_COMMUNITY)
Admission: RE | Admit: 2015-10-14 | Discharge: 2015-10-14 | Disposition: A | Discharge status: Home / Self Care | Payer: Self-pay | Source: Ambulatory Visit | Attending: Oncology | Admitting: Oncology

## 2015-10-14 DIAGNOSIS — D693 Immune thrombocytopenic purpura: Secondary | ICD-10-CM | POA: Insufficient documentation

## 2015-10-14 MED ORDER — ROMIPLOSTIM 250 MCG ~~LOC~~ SOLR
4.0000 ug/kg | Freq: Once | SUBCUTANEOUS | Status: AC
  Administered 2015-10-14: 385 ug via SUBCUTANEOUS
  Filled 2015-10-14: qty 0.77

## 2015-10-20 ENCOUNTER — Other Ambulatory Visit: Payer: Self-pay | Admitting: Oncology

## 2015-10-20 DIAGNOSIS — D693 Immune thrombocytopenic purpura: Secondary | ICD-10-CM

## 2015-10-21 ENCOUNTER — Encounter (HOSPITAL_COMMUNITY)
Admission: RE | Admit: 2015-10-21 | Discharge: 2015-10-21 | Disposition: A | Discharge status: Home / Self Care | Payer: Self-pay | Source: Ambulatory Visit | Attending: Oncology | Admitting: Oncology

## 2015-10-21 DIAGNOSIS — D693 Immune thrombocytopenic purpura: Secondary | ICD-10-CM | POA: Insufficient documentation

## 2015-10-21 LAB — CBC WITH DIFFERENTIAL/PLATELET
Basophils Absolute: 0.1 10*3/uL (ref 0.0–0.1)
Basophils Relative: 1 %
EOS PCT: 1 %
Eosinophils Absolute: 0.2 10*3/uL (ref 0.0–0.7)
HEMATOCRIT: 37.4 % (ref 36.0–46.0)
HEMOGLOBIN: 11.4 g/dL — AB (ref 12.0–15.0)
LYMPHS ABS: 3.7 10*3/uL (ref 0.7–4.0)
LYMPHS PCT: 33 %
MCH: 29.2 pg (ref 26.0–34.0)
MCHC: 30.5 g/dL (ref 30.0–36.0)
MCV: 95.9 fL (ref 78.0–100.0)
MONO ABS: 0.8 10*3/uL (ref 0.1–1.0)
MONOS PCT: 7 %
NEUTROS ABS: 6.4 10*3/uL (ref 1.7–7.7)
Neutrophils Relative %: 58 %
Platelets: 1075 10*3/uL (ref 150–400)
RBC: 3.9 MIL/uL (ref 3.87–5.11)
RDW: 18.8 % — ABNORMAL HIGH (ref 11.5–15.5)
WBC: 11.1 10*3/uL — ABNORMAL HIGH (ref 4.0–10.5)

## 2015-10-21 MED ORDER — ROMIPLOSTIM 250 MCG ~~LOC~~ SOLR
4.0000 ug/kg | Freq: Once | SUBCUTANEOUS | Status: DC
  Filled 2015-10-21: qty 0.77

## 2015-10-21 NOTE — Progress Notes (Signed)
Lab called and reported platelet count today of greater than one million.  I called and reported the platelet count to Dr Cyndie Chime.  He stated to hold the injection of N-plate today and tell the patient to come to his clinic next Thursday to have her labs checked.  I told the patient and her family that she did not need the shot today, to go to Dr Patsy Lager office for labs next Thursday and she verbalized understanding and told me she knows what time her appointment with him is already for next week.

## 2015-10-22 LAB — PATHOLOGIST SMEAR REVIEW: Path Review: INCREASED

## 2015-10-25 ENCOUNTER — Other Ambulatory Visit: Payer: Self-pay | Admitting: Oncology

## 2015-10-25 DIAGNOSIS — D693 Immune thrombocytopenic purpura: Secondary | ICD-10-CM

## 2015-11-01 ENCOUNTER — Encounter: Payer: Self-pay | Admitting: Oncology

## 2015-11-01 ENCOUNTER — Encounter (INDEPENDENT_AMBULATORY_CARE_PROVIDER_SITE_OTHER): Payer: Self-pay

## 2015-11-01 ENCOUNTER — Ambulatory Visit (INDEPENDENT_AMBULATORY_CARE_PROVIDER_SITE_OTHER): Payer: Self-pay | Admitting: Oncology

## 2015-11-01 DIAGNOSIS — D693 Immune thrombocytopenic purpura: Secondary | ICD-10-CM

## 2015-11-01 DIAGNOSIS — H5340 Unspecified visual field defects: Secondary | ICD-10-CM

## 2015-11-01 DIAGNOSIS — M545 Low back pain: Secondary | ICD-10-CM

## 2015-11-01 LAB — CBC WITH DIFFERENTIAL/PLATELET
BASOS ABS: 0.1 10*3/uL (ref 0.0–0.1)
BASOS PCT: 1 %
EOS ABS: 0.1 10*3/uL (ref 0.0–0.7)
EOS PCT: 1 %
HCT: 37.6 % (ref 36.0–46.0)
Hemoglobin: 11.5 g/dL — ABNORMAL LOW (ref 12.0–15.0)
Lymphocytes Relative: 30 %
Lymphs Abs: 4.4 10*3/uL — ABNORMAL HIGH (ref 0.7–4.0)
MCH: 29.1 pg (ref 26.0–34.0)
MCHC: 30.6 g/dL (ref 30.0–36.0)
MCV: 95.2 fL (ref 78.0–100.0)
MONO ABS: 1.2 10*3/uL — AB (ref 0.1–1.0)
MONOS PCT: 9 %
Neutro Abs: 8.6 10*3/uL — ABNORMAL HIGH (ref 1.7–7.7)
Neutrophils Relative %: 59 %
PLATELETS: 678 10*3/uL — AB (ref 150–400)
RBC: 3.95 MIL/uL (ref 3.87–5.11)
RDW: 17.6 % — AB (ref 11.5–15.5)
WBC: 14.4 10*3/uL — ABNORMAL HIGH (ref 4.0–10.5)

## 2015-11-01 NOTE — Patient Instructions (Signed)
Repeat blood count in 2 weeks and 4 weeks Visit with Granf[place]a [place]

## 2015-11-01 NOTE — Progress Notes (Signed)
Hematology and Oncology Follow Up Visit  Arron Mcnaught [id number] Aug 31, 1975 40 y.o. 11/01/2015 4:30 PM   Principle Diagnosis: Encounter Diagnosis  Name Primary?  . ITP (idiopathic thrombocytopenic purpura)   Patient interviewed and examined with the assistance of a mobile Skype Spanish translator  Interim History:   Short interval follow-up visit for this 40 year old Hispanic woman diagnosed with ITP in June of this year when she presented with sudden onset of bleeding, intermittent headache, and menorrhagia and was found to have no platelets on review of the peripheral blood film. Please see detailed note from office visit 10/05/2015 for a summary of her hospital course. Briefly, she failed to respond to high-dose dexamethasone or intravenous immunoglobulin. She had evidence for early intracranial hemorrhage. She had profuse oropharyngeal bleeding and moderate vaginal bleeding. She was started on parenteral Amicar and oral estrogens. She started a trial of a combination of Romiplostim and Rituxan. She had no initial response. 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 Romiplostim and Rituxan were continued. She was given a single dose of vincristine on July 4. She did not start to show a response until one week post splenectomy on July 6. She completed 4 planned doses of Rituxan. She was kept on a weekly Romiplostim at a dose of 4 mcg/kg. Platelet count rose to 361,000 by July 14 but then appeared to be slowly coming down to 183,000 by July 25. However, when she reported for her Romiplostim injection on August 3, platelet count now over 1 million. Romiplostim discontinued. At time of first post hospital visit on July 18, physical exam remarkable for bilateral retinal hemorrhages, extensive on the left, moderate on the right. Urgent ophthalmology consultation obtained but patient failed to report for the visit related to  transportation issues. She never rescheduled. She was given triple vaccines at time of the first postoperative visit which were not given pre-splenectomy due to my concern for active bleeding since these vaccines have to be given intramuscularly. She feels that her vision is slightly better. She is getting intermittent but not constant occipital headache. She is still having a sensation that her left ear is stopped up and still having problems with balance.  She did have some breakthrough vaginal bleeding when I stopped the Aygestin which I warned her about.  The bleeding has since stopped.  Her splenectomy wound has healed nicely. She noticed 1 small area of wound dehiscence and drainage at the extreme lateral aspect of the wound which she treated locally and which has resolved. Only other complaint today is low back pain and associated left paraspinal pain. She is morbidly obese. She admits that she had back problems prior to the diagnosis of ITP but the back pain has gotten worse. It hurts to bend over. Pain is partially relieved by Tylenol.   Medications: reviewed  Allergies: No Known Allergies  Review of Systems: See interim history Remaining ROS negative:   Physical Exam: Blood pressure (!) 96/49, pulse 91, temperature 98.3 F (36.8 C), temperature source Oral, height 5' (1.524 m), weight 220 lb 4.8 oz (99.9 kg), last menstrual period 08/28/2015, SpO2 100 %, currently breastfeeding. Wt Readings from Last 3 Encounters:  11/01/15 220 lb 4.8 oz (99.9 kg)  10/12/15 212 lb 8 oz (96.4 kg)  10/07/15 211 lb 6 oz (95.9 kg)     General appearance: She is alert and oriented HENNT: Pharynx no erythema, exudate, mass, or ulcer. No thyromegaly or thyroid nodules Lymph nodes: No cervical,  supraclavicular, or axillary lymphadenopathy Breasts:  Lungs: Clear to auscultation, resonant to percussion throughout Heart: Regular rhythm, no murmur, no gallop, no rub, no click, no edema Abdomen: Soft,  nontender, normal bowel sounds, no mass, no organomegaly Extremities: No edema, no calf tenderness Musculoskeletal: no joint deformities GU:  Vascular: Carotid pulses 2+, no bruits, distal pulses: Dorsalis pedis 1+ symmetric Neurologic: Alert, oriented, PERRLA, optic discs sharp; there has been major improvement in the bilateral retinal hemorrhages which are no longer visible on either side. There are some pale areas on the left retina in the area of prior hemorrhage. cranial nerves grossly normal, formal visual testing not done. motor strength 5 over 5 upper and lower extremities, reflexes absent but symmetric at the knees which is unchanged, upper body coordination normal, gait not tested Skin: No rash or ecchymosis  Lab Results: CBC W/Diff    Component Value Date/Time   WBC 14.4 (H) 11/01/2015 1508   RBC 3.95 11/01/2015 1508   HGB 11.5 (L) 11/01/2015 1508   HCT 37.6 11/01/2015 1508   HCT 27.3 (L) 09/21/2015 0900   PLT 678 (H) 11/01/2015 1508   MCV 95.2 11/01/2015 1508   MCH 29.1 11/01/2015 1508   MCHC 30.6 11/01/2015 1508   RDW 17.6 (H) 11/01/2015 1508   LYMPHSABS 4.4 (H) 11/01/2015 1508   MONOABS 1.2 (H) 11/01/2015 1508   EOSABS 0.1 11/01/2015 1508   BASOSABS 0.1 11/01/2015 1508     Chemistry      Component Value Date/Time   NA 138 09/21/2015 0550   K 3.6 09/21/2015 0550   CL 103 09/21/2015 0550   CO2 26 09/21/2015 0550   BUN 6 09/21/2015 0550   CREATININE 0.51 09/21/2015 0550      Component Value Date/Time   CALCIUM 8.8 (L) 09/21/2015 0550   ALKPHOS 49 09/05/2015 0538   AST 19 09/05/2015 0538   ALT 27 09/05/2015 0538   BILITOT 0.2 (L) 09/05/2015 0538       Radiological Studies: No results found.  Impression:  #1. ITP requiring aggressive multimodality therapy to achieve remission.  Currently with a rebound thrombocytosis. Platelet count is stabilizing. 1.1 million last week. 678,000 today. Now off all platelet stimulating drugs. Anticipate that her account  will continue to drift down into the normal or high normal range. I'll check blood counts again in 2 weeks.  #2. Bilateral retinal hemorrhages Now resolved but persistent visual deficit. She is again encouraged to schedule an appointment with an ophthalmologist.  #3. Low back pain I suspect this is primarily degenerative in nature. Gross inspection of her back today unremarkable for a presence of any hematoma. I could not locate the site of recent bone marrow biopsy. There were 3 small children in the room and I did not want to completely undress her. I encouraged her to continue to use Tylenol or extra strength Tylenol. If her symptoms progress then I will do further x-ray studies.    CC: Patient Care Team: No Pcp Per Patient as PCP - General (General Practice) Annia Belt, MD as Consulting Physician (Oncology) Stanford Breed (Hematology and Oncology) Danice Goltz, MD as Consulting Physician (Ophthalmology) Ara Juline Patch, MD (Internal Medicine)   Annia Belt, MD 8/14/20174:30 PM

## 2015-11-12 ENCOUNTER — Telehealth: Payer: Self-pay | Admitting: General Practice

## 2015-11-12 NOTE — Telephone Encounter (Signed)
APT. REMINDER CALL, NO ANSWER, NO VOICEMAIL °

## 2015-11-15 ENCOUNTER — Other Ambulatory Visit: Payer: Self-pay | Admitting: Oncology

## 2015-11-15 ENCOUNTER — Other Ambulatory Visit (INDEPENDENT_AMBULATORY_CARE_PROVIDER_SITE_OTHER): Payer: Self-pay

## 2015-11-15 DIAGNOSIS — D693 Immune thrombocytopenic purpura: Secondary | ICD-10-CM

## 2015-11-15 LAB — CBC WITH DIFFERENTIAL/PLATELET
Basophils Absolute: 0.1 10*3/uL (ref 0.0–0.1)
Basophils Relative: 1 %
EOS PCT: 2 %
Eosinophils Absolute: 0.3 10*3/uL (ref 0.0–0.7)
HCT: 38 % (ref 36.0–46.0)
HEMOGLOBIN: 11.7 g/dL — AB (ref 12.0–15.0)
LYMPHS ABS: 4.1 10*3/uL — AB (ref 0.7–4.0)
LYMPHS PCT: 38 %
MCH: 28.5 pg (ref 26.0–34.0)
MCHC: 30.8 g/dL (ref 30.0–36.0)
MCV: 92.7 fL (ref 78.0–100.0)
MONOS PCT: 8 %
Monocytes Absolute: 0.8 10*3/uL (ref 0.1–1.0)
NEUTROS PCT: 51 %
Neutro Abs: 5.5 10*3/uL (ref 1.7–7.7)
Platelets: 275 10*3/uL (ref 150–400)
RBC: 4.1 MIL/uL (ref 3.87–5.11)
RDW: 17.3 % — ABNORMAL HIGH (ref 11.5–15.5)
WBC: 10.7 10*3/uL — AB (ref 4.0–10.5)

## 2015-11-15 NOTE — Addendum Note (Signed)
Addended by: Bufford SpikesFULCHER, Sanah Kraska N on: 11/15/2015 11:22 AM   Modules accepted: Orders

## 2015-11-17 ENCOUNTER — Telehealth: Payer: Self-pay | Admitting: *Deleted

## 2015-11-17 NOTE — Telephone Encounter (Signed)
Called yesterday - pt / daughter not home. Called back today - no answer; left message via [facility] [employer] [place] # [phone] -"platelet count now normal range. Repeat again in On 9/18 then if good, we can go to once a month" per Dr Cyndie ChimeGranfortuna, Also have daughter to call back to schedule lab appt. I will f/u.

## 2015-11-17 NOTE — Telephone Encounter (Signed)
-----   Message from Levert FeinsteinJames M Granfortuna, MD sent at 11/15/2015  1:47 PM EDT ----- Call pt daughter: platelet count now normal range. Repeat again in  On 9/18 then if good, we can go to once a month

## 2015-11-18 ENCOUNTER — Other Ambulatory Visit: Payer: Self-pay | Admitting: *Deleted

## 2015-11-18 NOTE — Telephone Encounter (Signed)
Open in error

## 2015-12-06 ENCOUNTER — Other Ambulatory Visit (INDEPENDENT_AMBULATORY_CARE_PROVIDER_SITE_OTHER): Payer: Self-pay

## 2015-12-06 DIAGNOSIS — D693 Immune thrombocytopenic purpura: Secondary | ICD-10-CM

## 2015-12-06 LAB — CBC WITH DIFFERENTIAL/PLATELET
BASOS PCT: 0 %
Basophils Absolute: 0 10*3/uL (ref 0.0–0.1)
EOS ABS: 0.2 10*3/uL (ref 0.0–0.7)
EOS PCT: 3 %
HCT: 38.2 % (ref 36.0–46.0)
Hemoglobin: 11.8 g/dL — ABNORMAL LOW (ref 12.0–15.0)
Lymphocytes Relative: 41 %
Lymphs Abs: 3.6 10*3/uL (ref 0.7–4.0)
MCH: 28.3 pg (ref 26.0–34.0)
MCHC: 30.9 g/dL (ref 30.0–36.0)
MCV: 91.6 fL (ref 78.0–100.0)
MONO ABS: 0.8 10*3/uL (ref 0.1–1.0)
MONOS PCT: 10 %
Neutro Abs: 4 10*3/uL (ref 1.7–7.7)
Neutrophils Relative %: 46 %
PLATELETS: 468 10*3/uL — AB (ref 150–400)
RBC: 4.17 MIL/uL (ref 3.87–5.11)
RDW: 15.7 % — AB (ref 11.5–15.5)
WBC: 8.7 10*3/uL (ref 4.0–10.5)

## 2015-12-07 ENCOUNTER — Telehealth: Payer: Self-pay | Admitting: *Deleted

## 2015-12-07 NOTE — Telephone Encounter (Signed)
-----   Message from Levert FeinsteinJames M Granfortuna, MD sent at 12/06/2015  5:23 PM EDT ----- Call pt: platelets great at 468,000. Can wait 1 month for next check.  Note: pt Spanish speaking

## 2015-12-07 NOTE — Telephone Encounter (Signed)
Call and talk to pt's daughter who speaks English  -inform " platelets great at 468,000. Can wait 1 month for next check." per Dr Cyndie ChimeGranfortuna. States c/o being weak. Ask about taking iron pills - states she stop taking them b/c they caused her hair to fall out and after stopping, she has less hair loss. Then daughter ask about appt - inform she has an appt already schedule Oct 9 @ 1045AM.

## 2015-12-10 ENCOUNTER — Telehealth: Payer: Self-pay | Admitting: General Practice

## 2015-12-10 NOTE — Telephone Encounter (Signed)
AT. REMINDER CALL, LMTCB °

## 2015-12-13 ENCOUNTER — Other Ambulatory Visit: Payer: Self-pay

## 2015-12-27 ENCOUNTER — Ambulatory Visit (INDEPENDENT_AMBULATORY_CARE_PROVIDER_SITE_OTHER): Payer: Self-pay | Admitting: Oncology

## 2015-12-27 ENCOUNTER — Telehealth: Payer: Self-pay | Admitting: *Deleted

## 2015-12-27 ENCOUNTER — Encounter: Payer: Self-pay | Admitting: Oncology

## 2015-12-27 VITALS — BP 88/49 | HR 68 | Temp 98.1°F | Ht 60.0 in | Wt 223.9 lb

## 2015-12-27 DIAGNOSIS — Z8619 Personal history of other infectious and parasitic diseases: Secondary | ICD-10-CM

## 2015-12-27 DIAGNOSIS — Z9081 Acquired absence of spleen: Secondary | ICD-10-CM

## 2015-12-27 DIAGNOSIS — D693 Immune thrombocytopenic purpura: Secondary | ICD-10-CM

## 2015-12-27 LAB — CBC WITH DIFFERENTIAL/PLATELET
Basophils Absolute: 0 10*3/uL (ref 0.0–0.1)
Basophils Relative: 1 %
EOS PCT: 3 %
Eosinophils Absolute: 0.2 10*3/uL (ref 0.0–0.7)
HCT: 39.5 % (ref 36.0–46.0)
Hemoglobin: 12.4 g/dL (ref 12.0–15.0)
LYMPHS ABS: 3.4 10*3/uL (ref 0.7–4.0)
LYMPHS PCT: 41 %
MCH: 28.3 pg (ref 26.0–34.0)
MCHC: 31.4 g/dL (ref 30.0–36.0)
MCV: 90.2 fL (ref 78.0–100.0)
MONO ABS: 0.7 10*3/uL (ref 0.1–1.0)
Monocytes Relative: 8 %
NEUTROS ABS: 3.9 10*3/uL (ref 1.7–7.7)
NEUTROS PCT: 47 %
PLATELETS: 453 10*3/uL — AB (ref 150–400)
RBC: 4.38 MIL/uL (ref 3.87–5.11)
RDW: 15.1 % (ref 11.5–15.5)
WBC: 8.3 10*3/uL (ref 4.0–10.5)

## 2015-12-27 NOTE — Patient Instructions (Signed)
Al laboratorio ahora repeta  [facility] [employer] [facility] appunt[facility] con [facility] in 6 meses

## 2015-12-27 NOTE — Telephone Encounter (Signed)
Pt called via [employer] [phone]) - interpreter Kelby FamManuel [phone]- no answer; left message "platelets are good" (@ 453) per Dr Cyndie ChimeGranfortuna. And call [phone] for any questions.

## 2015-12-27 NOTE — Progress Notes (Signed)
Hematology and Oncology Follow Up Visit  [name] [id number] [birth year] 40 y.o. 12/27/2015 6:26 PM   Principle Diagnosis: Encounter Diagnoses  Name Primary?  . Chronic ITP (idiopathic thrombocytopenia) (HCC) Yes  . Idiopathic thrombocytopenic purpura [facility])   Clinical history:  40 year old Hispanic woman diagnosed with ITP in June of this year when she presented with sudden onset of bleeding, intermittent headache, and menorrhagia and was found to have no platelets on review of the peripheral blood film. Please see detailed note from office visit 10/05/2015 for a summary of her hospital course. Briefly, she failed to respond to high-dose dexamethasone or intravenous immunoglobulin. She had evidence for early intracranial hemorrhage. She had profuse oropharyngeal bleeding and moderate vaginal bleeding. She was started on parenteral Amicar and oral estrogens. She started a trial of a combination of Romiplostim and Rituxan. She had no initial response. Due to persistent bleeding and extension of intracranial bleeding she underwent open splenectomy on 09/16/2015. Romiplostim and Rituxan were continued. She was given a single dose of vincristine on July 4. She did not start to show a response until one week post splenectomy on July 6. She completed 4 planned doses of Rituxan. She was kept on a weekly Romiplostim at a dose of 4 mcg/kg. Platelet count rose to 361,000 by July 14 but then appeared to be slowly coming down to 183,000 by July 25. However, when she reported for her Romiplostim injection on August 3, platelet count now over 1 million. Romiplostim discontinued. At time of first post hospital visit on July 18, physical exam remarkable for bilateral retinal hemorrhages, extensive on the left, moderate on the right. Urgent ophthalmology consultation obtained but patient failed to report for the visit related to transportation issues. She never rescheduled. She was given triple vaccines  at time of the first postoperative visit which were not given pre-splenectomy due to my concern for active bleeding since these vaccines have to be given intramuscularly.   Interim History:  Patient interviewed with the assistance of a qualified Spanish translator via [employer]. Her platelet count has remained stable and has now plateaued at 453,000 today. Hemoglobin continues to improve, 12.4 g. White count has normalized. She feels her vision is almost completely back to normal. On my exam recorded below, there has been complete resolution of bilateral retinal hemorrhages. Headaches have resolved. She has seen no further oropharyngeal or vaginal bleeding. She denies any hematuria or hematochezia. She is still having some residual discomfort in the left abdomen and flank post splenectomy. Also some discomfort in her left buttock from the bone marrow biopsy. She is now off all estrogens. Her usual menstrual cycle has returned and is actually lighter than her average cycle.  Medications: reviewed  Allergies: No Known Allergies  Review of Systems: See interim history Remaining ROS negative:   Physical Exam: Blood pressure (!) 88/49, pulse 68, temperature 98.1 F (36.7 C), temperature source Oral, height 5' (1.524 m), weight 223 lb 14.4 oz (101.6 kg), SpO2 98 %, currently breastfeeding. Wt Readings from Last 3 Encounters:  12/27/15 223 lb 14.4 oz (101.6 kg)  11/01/15 220 lb 4.8 oz (99.9 kg)  10/12/15 212 lb 8 oz (96.4 kg)     General appearance: obese Hispanic woman HENNT: Pharynx no erythema, exudate, mass, or ulcer. No thyromegaly or thyroid nodules Lymph nodes: No cervical, supraclavicular, or axillary lymphadenopathy Breasts:  Lungs: Clear to auscultation, resonant to percussion throughout Heart: Regular rhythm, no murmur, no gallop, no rub, no click, no edema Abdomen: Soft, nontender, normal  bowel sounds, no mass, Healed surgical scar left upper quadrant status post open  splenectomy. Extremities: No edema, no calf tenderness Musculoskeletal: no joint deformities GU:  Vascular: Carotid pulses 2+, no bruits, distal pulses: Dorsalis pedis 1+ symmetric Neurologic: Alert, oriented, PERRLA, optic discs sharp and vessels normal, Bilateral retinal hemorrhages have completely resolved , cranial nerves grossly normal, motor strength 5 over 5, reflexes 1+ symmetric, upper body coordination normal, gait normal, Skin: No rash or ecchymosis  Lab Results: CBC W/Diff    Component Value Date/Time   WBC 8.3 12/27/2015 1120   RBC 4.38 12/27/2015 1120   HGB 12.4 12/27/2015 1120   HCT 39.5 12/27/2015 1120   HCT 27.3 (L) 09/21/2015 0900   PLT 453 (H) 12/27/2015 1120   MCV 90.2 12/27/2015 1120   MCH 28.3 12/27/2015 1120   MCHC 31.4 12/27/2015 1120   RDW 15.1 12/27/2015 1120   LYMPHSABS 3.4 12/27/2015 1120   MONOABS 0.7 12/27/2015 1120   EOSABS 0.2 12/27/2015 1120   BASOSABS 0.0 12/27/2015 1120     Chemistry      Component Value Date/Time   NA 138 09/21/2015 0550   K 3.6 09/21/2015 0550   CL 103 09/21/2015 0550   CO2 26 09/21/2015 0550   BUN 6 09/21/2015 0550   CREATININE 0.51 09/21/2015 0550      Component Value Date/Time   CALCIUM 8.8 (L) 09/21/2015 0550   ALKPHOS 49 09/05/2015 0538   AST 19 09/05/2015 0538   ALT 27 09/05/2015 0538   BILITOT 0.2 (L) 09/05/2015 0538       Radiological Studies: No results found.  Impression:  #1. ITP in remission  Initially treatment refractory ITP now in remission post treatment outlined above. We will continue to monitor her blood counts but I will decrease frequency to every 2 months at this time. I need to check her immunization program to see if she is due for a second dose of the pneumococcal vaccine.  #2. Bilateral retinal hemorrhages secondary to #1 now resolved  #3. 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   CC: Patient  Care Team: No Pcp Per Patient as PCP - General (General Practice) Annia Belt, MD as Consulting Physician (Oncology) Stanford Breed (Hematology and Oncology) Danice Goltz, MD as Consulting Physician (Ophthalmology) Ara Juline Patch, MD (Internal Medicine)   Annia Belt, MD 10/9/20176:26 PM

## 2015-12-27 NOTE — Telephone Encounter (Signed)
-----   Message from James MLevert Feinstein Granfortuna, MD sent at 12/27/2015  1:22 PM EDT ----- Call pt: platelets good.  Note: speaks [place]

## 2016-02-29 ENCOUNTER — Telehealth: Payer: Self-pay

## 2016-02-29 NOTE — Telephone Encounter (Signed)
Please call pt back regarding stomach pain.  

## 2016-02-29 NOTE — Telephone Encounter (Signed)
Lm for rtc 

## 2016-05-01 ENCOUNTER — Encounter: Payer: Self-pay | Admitting: Oncology

## 2016-07-17 NOTE — Progress Notes (Signed)
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 By report, her platelet count was essentially 0. She did not respond to high-dose dexamethasone or IVIG. She was then started on a trial of N-plate and Rituxan. Unfortunately, she had no initial response. Due to persistent bleeding she underwent splenectomy on September 13, 2015. She was also given a single dose of vincristine on September 21, 2015. She eventually completed 4 doses of weekly Rituxan with continuation of her weekly N-plate. Her platelet count slowly recovered and by mid-July was within normal limits. Her platelet count then increased over 1 million and N-plate was discontinued. She does not appear to have any treatment since that time. She currently feels well and is at her baseline. She does not report any easy bleeding or bruising. She is no neurologic complaints. She denies any recent fevers. She has a good appetite and denies weight loss. She has no chest pain or shortness of breath. She denies any nausea, vomiting, constipation, or diarrhea. She has no urinary complaints. Patient feels at her baseline and offers no specific complaints today.  REVIEW OF SYSTEMS:   Review of Systems  Constitutional: Negative.  Negative for fever, malaise/fatigue and weight loss.  HENT: Negative.  Negative for nosebleeds.   Respiratory: Negative.  Negative for cough and shortness of breath.   Cardiovascular: Negative.  Negative for  chest pain and leg swelling.  Gastrointestinal: Negative.  Negative for abdominal pain, blood in stool and melena.  Genitourinary: Negative.  Negative for hematuria.  Musculoskeletal: Negative.   Skin: Negative.  Negative for rash.  Neurological: Negative.  Negative for weakness.  Endo/Heme/Allergies: Negative.  Does not bruise/bleed easily.  Psychiatric/Behavioral: Negative.     As per HPI. Otherwise, a complete review of systems is negative.  PAST MEDICAL HISTORY: Past Medical History:  Diagnosis Date  . Depression   . 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    PAST SURGICAL HISTORY: Past Surgical History:  Procedure Laterality Date  . BREAST SURGERY  biopsy  . SPLENECTOMY, TOTAL N/A 09/16/2015   Procedure: OPEN SPLENECTOMY;  Surgeon: Chevis Pretty III, MD;  Location: [facility] OR;  Service: General;  Laterality: N/A;    FAMILY HISTORY:  Reviewed and unchanged. No reported history of malignancy or chronic disease.  ADVANCED DIRECTIVES (Y/N):  N  HEALTH MAINTENANCE: Social History  Substance Use Topics  . Smoking status: Never Smoker  . Smokeless tobacco: Never Used  . Alcohol use No     Colonoscopy:  PAP:  Bone density:  Lipid panel:  No Known Allergies  Current Outpatient Prescriptions  Medication Sig Dispense Refill  . acetaminophen (TYLENOL) 500 MG tablet Take 1-2 tablets (500-1,000 mg total) by mouth every 6 (six) hours as needed for mild pain or moderate pain. 30 tablet 0  . Multiple Vitamin (MULTIVITAMIN WITH MINERALS) TABS tablet Take 1 tablet by mouth daily.    Marland Kitchen omeprazole (PRILOSEC) 20 MG capsule Take 20 mg by mouth daily.    . sertraline (ZOLOFT) 50 MG tablet  Take 50 mg by mouth daily.     No current facility-administered medications for this visit.     OBJECTIVE: Vitals:   07/19/16 1127  BP: 104/69  Pulse: 73  Temp: 98.2 F (36.8 C)     Body mass index is 45.24 kg/m.    ECOG FS:0 - Asymptomatic  General:  Well-developed, well-nourished, no acute distress. Eyes: Pink conjunctiva, anicteric sclera. HEENT: Normocephalic, moist mucous membranes, clear oropharnyx. Lungs: Clear to auscultation bilaterally. Heart: Regular rate and rhythm. No rubs, murmurs, or gallops. Abdomen: Soft, nontender, nondistended. No organomegaly noted, normoactive bowel sounds. Musculoskeletal: No edema, cyanosis, or clubbing. Neuro: Alert, answering all questions appropriately. Cranial nerves grossly intact. Skin: No rashes or petechiae noted. Psych: Normal affect. Lymphatics: No cervical, calvicular, axillary or inguinal LAD.   LAB RESULTS:  Lab Results  Component Value Date   NA 138 09/21/2015   K 3.6 09/21/2015   CL 103 09/21/2015   CO2 26 09/21/2015   GLUCOSE 127 (H) 09/21/2015   BUN 6 09/21/2015   CREATININE 0.51 09/21/2015   CALCIUM 8.8 (L) 09/21/2015   PROT 8.4 (H) 09/05/2015   ALBUMIN 2.8 (L) 09/05/2015   AST 19 09/05/2015   ALT 27 09/05/2015   ALKPHOS 49 09/05/2015   BILITOT 0.2 (L) 09/05/2015   GFRNONAA >60 09/21/2015   GFRAA >60 09/21/2015    Lab Results  Component Value Date   WBC 9.2 07/19/2016   NEUTROABS 4.7 07/19/2016   HGB 13.8 07/19/2016   HCT 40.0 07/19/2016   MCV 84.0 07/19/2016   PLT 355 07/19/2016     STUDIES: No results found.  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  PLAN:    1. ITP:  See patient's presenting history and treatment history in the HPI. She last received Rituxan and N-plate on October 07, 2015. Her last dose of N-plate was on October 14, 2015. Her platelet count continues to be within normal limits. No intervention is needed at this time. Return to clinic in 2 months with repeat laboratory work and further evaluation.  Approximately 45 minutes was spent in discussion of which greater than 50% was consultation.  Entire visit was done in the presence of an interpreter.  Patient expressed understanding and was in agreement with this plan. She  also understands that She can call clinic at any time with any questions, concerns, or complaints.    Jeralyn Ruths, MD   07/23/2016 4:27 PM

## 2016-07-19 ENCOUNTER — Inpatient Hospital Stay: Payer: Self-pay | Attending: Oncology | Admitting: Oncology

## 2016-07-19 ENCOUNTER — Inpatient Hospital Stay: Payer: Self-pay

## 2016-07-19 VITALS — BP 104/69 | HR 73 | Temp 98.2°F | Ht 58.66 in | Wt 221.4 lb

## 2016-07-19 DIAGNOSIS — Z9081 Acquired absence of spleen: Secondary | ICD-10-CM | POA: Insufficient documentation

## 2016-07-19 DIAGNOSIS — Z79899 Other long term (current) drug therapy: Secondary | ICD-10-CM | POA: Insufficient documentation

## 2016-07-19 DIAGNOSIS — D693 Immune thrombocytopenic purpura: Secondary | ICD-10-CM | POA: Insufficient documentation

## 2016-07-19 DIAGNOSIS — Z9229 Personal history of other drug therapy: Secondary | ICD-10-CM | POA: Insufficient documentation

## 2016-07-19 LAB — CBC WITH DIFFERENTIAL/PLATELET
BASOS ABS: 0.1 10*3/uL (ref 0–0.1)
BASOS PCT: 1 %
EOS PCT: 1 %
Eosinophils Absolute: 0.1 10*3/uL (ref 0–0.7)
HEMATOCRIT: 40 % (ref 35.0–47.0)
Hemoglobin: 13.8 g/dL (ref 12.0–16.0)
Lymphocytes Relative: 41 %
Lymphs Abs: 3.8 10*3/uL — ABNORMAL HIGH (ref 1.0–3.6)
MCH: 29.1 pg (ref 26.0–34.0)
MCHC: 34.6 g/dL (ref 32.0–36.0)
MCV: 84 fL (ref 80.0–100.0)
MONO ABS: 0.6 10*3/uL (ref 0.2–0.9)
MONOS PCT: 6 %
Neutro Abs: 4.7 10*3/uL (ref 1.4–6.5)
Neutrophils Relative %: 51 %
PLATELETS: 355 10*3/uL (ref 150–440)
RBC: 4.76 MIL/uL (ref 3.80–5.20)
RDW: 14.1 % (ref 11.5–14.5)
WBC: 9.2 10*3/uL (ref 3.6–11.0)

## 2016-07-19 NOTE — Progress Notes (Signed)
Patient here for initial visit. She is having severe abdominal pain not related to this visit.

## 2016-09-18 ENCOUNTER — Ambulatory Visit: Payer: Self-pay | Admitting: Oncology

## 2016-09-18 ENCOUNTER — Other Ambulatory Visit: Payer: Self-pay

## 2016-12-20 ENCOUNTER — Encounter: Payer: Self-pay | Admitting: General Practice

## 2017-03-05 ENCOUNTER — Ambulatory Visit: Payer: Self-pay | Admitting: General Surgery

## 2017-03-07 ENCOUNTER — Encounter: Payer: Self-pay | Admitting: General Surgery

## 2017-03-08 ENCOUNTER — Encounter: Payer: Self-pay | Admitting: *Deleted

## 2018-02-04 ENCOUNTER — Emergency Department (HOSPITAL_COMMUNITY)
Admission: EM | Admit: 2018-02-04 | Discharge: 2018-02-05 | Disposition: A | Discharge status: Home / Self Care | Payer: Self-pay | Attending: Emergency Medicine | Admitting: Emergency Medicine

## 2018-02-04 ENCOUNTER — Encounter (HOSPITAL_COMMUNITY): Payer: Self-pay

## 2018-02-04 DIAGNOSIS — N3000 Acute cystitis without hematuria: Secondary | ICD-10-CM | POA: Insufficient documentation

## 2018-02-04 DIAGNOSIS — R1012 Left upper quadrant pain: Secondary | ICD-10-CM

## 2018-02-04 DIAGNOSIS — Z79899 Other long term (current) drug therapy: Secondary | ICD-10-CM | POA: Insufficient documentation

## 2018-02-04 LAB — CBC
HCT: 40.8 % (ref 36.0–46.0)
HEMOGLOBIN: 12.6 g/dL (ref 12.0–15.0)
MCH: 27.6 pg (ref 26.0–34.0)
MCHC: 30.9 g/dL (ref 30.0–36.0)
MCV: 89.5 fL (ref 80.0–100.0)
Platelets: 395 10*3/uL (ref 150–400)
RBC: 4.56 MIL/uL (ref 3.87–5.11)
RDW: 14 % (ref 11.5–15.5)
WBC: 12.4 10*3/uL — AB (ref 4.0–10.5)
nRBC: 0 % (ref 0.0–0.2)

## 2018-02-04 LAB — COMPREHENSIVE METABOLIC PANEL
ALT: 20 U/L (ref 0–44)
ANION GAP: 8 (ref 5–15)
AST: 21 U/L (ref 15–41)
Albumin: 3.8 g/dL (ref 3.5–5.0)
Alkaline Phosphatase: 78 U/L (ref 38–126)
BILIRUBIN TOTAL: 0.2 mg/dL — AB (ref 0.3–1.2)
BUN: 11 mg/dL (ref 6–20)
CO2: 26 mmol/L (ref 22–32)
Calcium: 9 mg/dL (ref 8.9–10.3)
Chloride: 103 mmol/L (ref 98–111)
Creatinine, Ser: 0.66 mg/dL (ref 0.44–1.00)
GFR calc Af Amer: >60 mL/min (ref >60)
Glucose, Bld: 107 mg/dL — ABNORMAL HIGH (ref 70–99)
POTASSIUM: 3.6 mmol/L (ref 3.5–5.1)
Sodium: 137 mmol/L (ref 135–145)
TOTAL PROTEIN: 7.3 g/dL (ref 6.5–8.1)

## 2018-02-04 LAB — LIPASE, BLOOD: Lipase: 48 U/L (ref 11–51)

## 2018-02-04 LAB — I-STAT BETA HCG BLOOD, ED (MC, WL, AP ONLY)

## 2018-02-04 NOTE — ED Triage Notes (Signed)
Pt states that she has been having LUQ abd pain that started today around 2pm, denies n/v/d dysuria. Pt also c/o of a headache that has been going on for weeks with dizziness, feeling like she is going to pass out.

## 2018-02-05 ENCOUNTER — Emergency Department (HOSPITAL_COMMUNITY): Payer: Self-pay

## 2018-02-05 ENCOUNTER — Other Ambulatory Visit (HOSPITAL_COMMUNITY): Payer: Self-pay

## 2018-02-05 LAB — URINALYSIS, ROUTINE W REFLEX MICROSCOPIC
Bilirubin Urine: NEGATIVE
Glucose, UA: NEGATIVE mg/dL
KETONES UR: NEGATIVE mg/dL
Nitrite: NEGATIVE
PROTEIN: NEGATIVE mg/dL
Specific Gravity, Urine: 1.024 (ref 1.005–1.030)
pH: 6 (ref 5.0–8.0)

## 2018-02-05 MED ORDER — MORPHINE SULFATE (PF) 4 MG/ML IV SOLN
4.0000 mg | Freq: Once | INTRAVENOUS | Status: AC
  Administered 2018-02-05: 4 mg via INTRAVENOUS
  Filled 2018-02-05: qty 1

## 2018-02-05 MED ORDER — IOHEXOL 300 MG/ML  SOLN
100.0000 mL | Freq: Once | INTRAMUSCULAR | Status: AC | PRN
  Administered 2018-02-05: 100 mL via INTRAVENOUS

## 2018-02-05 MED ORDER — ONDANSETRON HCL 4 MG/2ML IJ SOLN
4.0000 mg | Freq: Once | INTRAMUSCULAR | Status: AC
  Administered 2018-02-05: 4 mg via INTRAVENOUS
  Filled 2018-02-05: qty 2

## 2018-02-05 MED ORDER — CEPHALEXIN 500 MG PO CAPS: 500.0000 mg | ORAL_CAPSULE | Freq: Three times a day (TID) | ORAL | 0 refills | Status: DC

## 2018-02-05 NOTE — ED Notes (Signed)
Attempted IV x2. 

## 2018-02-05 NOTE — Discharge Instructions (Addendum)
You were seen today for abdominal pain.  The cause of your pain at this time is not clear and you had a negative CT scan.  You do have evidence of UTI.  You will be treated with antibiotics.  Follow-up with your general surgeon if pain persists.

## 2018-02-05 NOTE — ED Provider Notes (Signed)
[facility] EMERGENCY DEPARTMENT Provider Note   CSN: [id number] Arrival date & time: 02/04/18  2045     History   Chief Complaint Chief Complaint  Patient presents with  . Abdominal Pain    HPI Yadira Hada is a 42 y.o. female.  HPI  This is a 43 year old female with a history of ITP status post splenectomy who presents with left upper quadrant pain.  Patient reports that she has had months of waxing and waning left upper quadrant pain.  The pain started after having her splenectomy in June.  She has not followed up with her surgeon.  She states tonight her pain was worse.  She rates it at 9 out of 10.  She states that it is sharp and radiates to her back.  She states that it usually is better when she lays flat but tonight that did not help.  No association with food intake.  Denies any urinary symptoms including dysuria or hematuria.  Denies any fevers.  She denies any nausea, vomiting, diarrhea, constipation.  Past Medical History:  Diagnosis Date  . Depression   . 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 Chronic ITP (idiopathic thrombocytopenia) (HCC)   . Nontraumatic intracerebral hemorrhage (HCC)   . Menorrhagia with regular cycle   . Pressure ulcer 09/19/2015  . Gingival bleeding   . Chronic hepatitis B (HCC) 09/04/2015  . 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 Severe thrombocytopenia (HCC) 09/04/2015  . Acute ITP (HCC) 08/28/2015    Past Surgical History:  Procedure Laterality Date  . BREAST SURGERY  biopsy  . 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 Authorizing Provider  sertraline (ZOLOFT) 50 MG tablet Take 50 mg by mouth daily.   Yes [provider]  acetaminophen (TYLENOL) 500 MG tablet Take 1-2 tablets (500-1,000 mg total) by mouth every 6 (six) hours as needed for mild pain or moderate pain. Patient not taking: Reported on 02/05/2018 09/22/15   Servando Snare, MD  cephALEXin (KEFLEX) 500 MG capsule Take 1 capsule (500 mg total) by mouth 3 (three) times daily. 02/05/18   , Mayer Masker, MD    Family History No family history on file.  Social History Social History   Tobacco Use  . Smoking status: Never Smoker  . Smokeless tobacco: Never Used  Substance Use Topics  . Alcohol use: No    Alcohol/week: 0.0 standard drinks  . Drug use: No     Allergies   Patient has no known allergies.   Review of Systems Review of Systems  Constitutional: Negative for fever.  Respiratory: Negative for shortness of breath.   Cardiovascular: Negative for chest pain.  Gastrointestinal:  Positive for abdominal pain. Negative for diarrhea, nausea and vomiting.  Genitourinary: Negative for dysuria.  Musculoskeletal: Negative for back pain.  All other systems reviewed and are negative.    Physical Exam Updated Vital Signs BP 100/68   Pulse 74   Temp (!) 97.5 F (36.4 C) (Oral)   Resp 18   SpO2 96%   Physical Exam  Constitutional: She is oriented to person, place, and time. She appears well-developed and well-nourished.  Morbidly obese, no acute distress  HENT:  Head: Normocephalic and atraumatic.  Neck: Neck supple.  Cardiovascular: Normal rate, regular rhythm and normal heart sounds.  Pulmonary/Chest: Effort normal. No respiratory distress. She has no wheezes.  Abdominal: Soft. Bowel sounds are normal. There is no rebound and no guarding.  Large  left upper quadrant well-healing incision noted, mild tenderness to palpation, no rebound or guarding  Neurological: She is alert and oriented to person, place, and time.  Skin: Skin is warm and dry.  Psychiatric: She has a normal mood and affect.  Nursing note and vitals reviewed.    ED Treatments / Results  Labs (all labs ordered are listed, but only abnormal results are displayed) Labs Reviewed  COMPREHENSIVE METABOLIC PANEL - Abnormal; Notable for the following components:      Result Value   Glucose, Bld 107 (*)    Total Bilirubin 0.2 (*)    All other components within normal limits  CBC - Abnormal; Notable for the following components:   WBC 12.4 (*)    All other components within normal limits  URINALYSIS, ROUTINE W REFLEX MICROSCOPIC - Abnormal; Notable for the following components:   APPearance HAZY (*)    Hgb urine dipstick MODERATE (*)    Leukocytes, UA LARGE (*)    Bacteria, UA MANY (*)    All other components within normal limits  URINE CULTURE  LIPASE, BLOOD  I-STAT BETA HCG BLOOD, ED ([facility], [facility], [facility] ONLY)    EKG None  Radiology Ct Abdomen Pelvis W Contrast  Result Date: 02/05/2018 CLINICAL DATA:  Left upper quadrant abdominal pain. Previous splenectomy. EXAM: CT ABDOMEN AND PELVIS WITH CONTRAST TECHNIQUE: Multidetector CT imaging of the abdomen and pelvis was performed using the standard protocol following bolus administration of intravenous contrast. CONTRAST:  OMNIPAQUE IOHEXOL 300 MG/ML  SOLN COMPARISON:  None. FINDINGS: Lower chest: Dependent atelectasis in the lung bases. Hepatobiliary: Mild diffuse fatty infiltration in the liver. No focal liver abnormality is seen. No gallstones, gallbladder wall thickening, or biliary dilatation. Pancreas: Unremarkable. No pancreatic ductal dilatation or surrounding inflammatory changes. Spleen: Surgical absence of the spleen. No abscess or collection identified. Adrenals/Urinary Tract: No adrenal gland nodules. Kidneys  are symmetrical. Nephrograms are homogeneous. No hydronephrosis or hydroureter. Bladder is unremarkable. Stomach/Bowel: Stomach is within normal limits. Appendix appears normal. No evidence of bowel wall thickening, distention, or inflammatory changes. Vascular/Lymphatic: No significant vascular findings are present. No enlarged abdominal or pelvic lymph nodes. Reproductive: Uterus and bilateral adnexa are unremarkable. Other: Small periumbilical hernia containing fat. No free air or free fluid in the abdomen. Musculoskeletal: No acute or significant osseous findings. IMPRESSION: No acute process demonstrated in the abdomen or pelvis. No evidence of bowel obstruction or inflammation. Mild diffuse fatty infiltration of the liver. Surgical absence of the spleen. Electronically Signed   By: Burman Nieves M.D.   On: 02/05/2018 01:52    Procedures Procedures (including critical care time)  Medications Ordered in ED Medications  morphine 4 MG/ML injection 4 mg (4 mg Intravenous  Given 02/05/18 0104)  ondansetron (ZOFRAN) injection 4 mg (4 mg Intravenous Given 02/05/18 0104)  iohexol (OMNIPAQUE) 300 MG/ML solution 100 mL (100 mLs Intravenous Contrast Given 02/05/18 0129)     Initial Impression / Assessment and Plan / ED Course  I have reviewed the triage vital signs and the nursing notes.  Pertinent labs & imaging results that were available during my care of the patient were reviewed by me and considered in my medical decision making (see chart for details).     Presents with left upper quadrant pain.  She is overall nontoxic and vital signs are reassuring.  She status post splenectomy in June for her ITP.  She reports symptoms have been ongoing and waxing and waning since that time.  She is mildly tender on exam with no signs of peritonitis.  Lab work-up is largely reassuring.  Urinalysis does look infected with large leuk esterase, 21-50 white cells and many bacteria.  Urine culture was sent.  CT  scan of the abdomen obtained.  Shows no evidence of postop complication.  There is no obvious source of patient's pain.  Pain is not consistent with pancreatitis or gastritis.  I discussed the results with patient and her family.  We will treat for UTI and have her follow-up with general surgery.  After history, exam, and medical workup I feel the patient has been appropriately medically screened and is safe for discharge home. Pertinent diagnoses were discussed with the patient. Patient was given return precautions.   Final Clinical Impressions(s) / ED Diagnoses   Final diagnoses:  Left upper quadrant pain  Acute cystitis without hematuria    ED Discharge Orders         Ordered    cephALEXin (KEFLEX) 500 MG capsule  3 times daily     02/05/18 0219           Shon BatonHorton,  F, MD 02/05/18 0221

## 2018-02-06 LAB — URINE CULTURE

## 2018-03-13 ENCOUNTER — Emergency Department (HOSPITAL_COMMUNITY): Payer: Medicaid Other

## 2018-03-13 ENCOUNTER — Encounter (HOSPITAL_COMMUNITY): Payer: Self-pay

## 2018-03-13 ENCOUNTER — Other Ambulatory Visit: Payer: Self-pay

## 2018-03-13 ENCOUNTER — Inpatient Hospital Stay (HOSPITAL_COMMUNITY)
Admission: EM | Admit: 2018-03-13 | Discharge: 2018-03-16 | DRG: 813 | Disposition: A | Discharge status: Home / Self Care | Payer: Medicaid Other | Attending: Internal Medicine | Admitting: Internal Medicine

## 2018-03-13 DIAGNOSIS — Z9221 Personal history of antineoplastic chemotherapy: Secondary | ICD-10-CM

## 2018-03-13 DIAGNOSIS — D649 Anemia, unspecified: Secondary | ICD-10-CM | POA: Diagnosis present

## 2018-03-13 DIAGNOSIS — D693 Immune thrombocytopenic purpura: Principal | ICD-10-CM | POA: Diagnosis present

## 2018-03-13 DIAGNOSIS — R29700 NIHSS score 0: Secondary | ICD-10-CM | POA: Diagnosis present

## 2018-03-13 DIAGNOSIS — R1012 Left upper quadrant pain: Secondary | ICD-10-CM | POA: Diagnosis present

## 2018-03-13 DIAGNOSIS — R0602 Shortness of breath: Secondary | ICD-10-CM | POA: Diagnosis present

## 2018-03-13 DIAGNOSIS — E876 Hypokalemia: Secondary | ICD-10-CM | POA: Diagnosis present

## 2018-03-13 DIAGNOSIS — I609 Nontraumatic subarachnoid hemorrhage, unspecified: Secondary | ICD-10-CM

## 2018-03-13 DIAGNOSIS — T380X5A Adverse effect of glucocorticoids and synthetic analogues, initial encounter: Secondary | ICD-10-CM | POA: Diagnosis not present

## 2018-03-13 DIAGNOSIS — Z9081 Acquired absence of spleen: Secondary | ICD-10-CM | POA: Diagnosis not present

## 2018-03-13 DIAGNOSIS — D72829 Elevated white blood cell count, unspecified: Secondary | ICD-10-CM | POA: Diagnosis not present

## 2018-03-13 DIAGNOSIS — I959 Hypotension, unspecified: Secondary | ICD-10-CM | POA: Diagnosis present

## 2018-03-13 DIAGNOSIS — Y92230 Patient room in hospital as the place of occurrence of the external cause: Secondary | ICD-10-CM | POA: Diagnosis not present

## 2018-03-13 DIAGNOSIS — R61 Generalized hyperhidrosis: Secondary | ICD-10-CM | POA: Diagnosis not present

## 2018-03-13 HISTORY — DX: Immune thrombocytopenic purpura: D69.3

## 2018-03-13 HISTORY — DX: Headache: R51

## 2018-03-13 HISTORY — DX: Headache, unspecified: R51.9

## 2018-03-13 HISTORY — DX: Anemia, unspecified: D64.9

## 2018-03-13 LAB — COMPREHENSIVE METABOLIC PANEL
ALBUMIN: 3.8 g/dL (ref 3.5–5.0)
ALK PHOS: 60 U/L (ref 38–126)
ALT: 20 U/L (ref 0–44)
ANION GAP: 9 (ref 5–15)
AST: 18 U/L (ref 15–41)
BUN: 10 mg/dL (ref 6–20)
CALCIUM: 9.2 mg/dL (ref 8.9–10.3)
CHLORIDE: 105 mmol/L (ref 98–111)
CO2: 25 mmol/L (ref 22–32)
Creatinine, Ser: 0.57 mg/dL (ref 0.44–1.00)
GFR calc Af Amer: >60 mL/min (ref >60)
GFR calc non Af Amer: >60 mL/min (ref >60)
GLUCOSE: 96 mg/dL (ref 70–99)
Potassium: 3.4 mmol/L — ABNORMAL LOW (ref 3.5–5.1)
SODIUM: 139 mmol/L (ref 135–145)
Total Bilirubin: 0.4 mg/dL (ref 0.3–1.2)
Total Protein: 7.1 g/dL (ref 6.5–8.1)

## 2018-03-13 LAB — LACTIC ACID, PLASMA: Lactic Acid, Venous: 1.2 mmol/L (ref 0.5–1.9)

## 2018-03-13 LAB — TYPE AND SCREEN
ABO/RH(D): O POS
ANTIBODY SCREEN: NEGATIVE

## 2018-03-13 LAB — HCG, SERUM, QUALITATIVE: Preg, Serum: NEGATIVE

## 2018-03-13 LAB — PROTIME-INR
INR: 1.05
PROTHROMBIN TIME: 13.6 s (ref 11.4–15.2)

## 2018-03-13 LAB — APTT: APTT: 33 s (ref 24–36)

## 2018-03-13 LAB — LIPASE, BLOOD: Lipase: 43 U/L (ref 11–51)

## 2018-03-13 MED ORDER — IMMUNE GLOBULIN (HUMAN) 5 GM/50ML IV SOLN
1.0000 g/kg | Freq: Once | INTRAVENOUS | Status: AC
  Administered 2018-03-14: 45 g via INTRAVENOUS
  Filled 2018-03-13: qty 50

## 2018-03-13 MED ORDER — SODIUM CHLORIDE 0.9 % IV BOLUS
1000.0000 mL | Freq: Once | INTRAVENOUS | Status: AC
  Administered 2018-03-13: 1000 mL via INTRAVENOUS

## 2018-03-13 MED ORDER — OXYCODONE HCL 5 MG PO TABS: 10.0000 mg | ORAL_TABLET | ORAL | Status: DC | PRN

## 2018-03-13 MED ORDER — IOHEXOL 300 MG/ML  SOLN
100.0000 mL | Freq: Once | INTRAMUSCULAR | Status: AC | PRN
  Administered 2018-03-13: 100 mL via INTRAVENOUS

## 2018-03-13 MED ORDER — SODIUM CHLORIDE 0.9% IV SOLUTION
Freq: Once | INTRAVENOUS | Status: AC
  Administered 2018-03-13: 23:00:00 via INTRAVENOUS

## 2018-03-13 MED ORDER — ONDANSETRON HCL 4 MG/2ML IJ SOLN: 4.0000 mg | Freq: Four times a day (QID) | INTRAMUSCULAR | Status: DC | PRN

## 2018-03-13 MED ORDER — DEXAMETHASONE 4 MG PO TABS
40.0000 mg | ORAL_TABLET | Freq: Once | ORAL | Status: AC
  Administered 2018-03-13: 40 mg via ORAL
  Filled 2018-03-13: qty 10

## 2018-03-13 MED ORDER — POTASSIUM CHLORIDE CRYS ER 20 MEQ PO TBCR
40.0000 meq | EXTENDED_RELEASE_TABLET | Freq: Once | ORAL | Status: AC
  Administered 2018-03-13: 40 meq via ORAL
  Filled 2018-03-13: qty 2

## 2018-03-13 MED ORDER — ACETAMINOPHEN 325 MG PO TABS
650.0000 mg | ORAL_TABLET | Freq: Four times a day (QID) | ORAL | Status: DC | PRN
  Administered 2018-03-14 (×2): 650 mg via ORAL
  Filled 2018-03-13 (×2): qty 2

## 2018-03-13 MED ORDER — IMMUNE GLOBULIN (HUMAN) 20 GM/200ML IV SOLN: 1.0000 g/kg | Freq: Once | INTRAVENOUS | Status: DC

## 2018-03-13 MED ORDER — DEXAMETHASONE SODIUM PHOSPHATE 4 MG/ML IJ SOLN
4.0000 mg | Freq: Three times a day (TID) | INTRAMUSCULAR | Status: DC
  Administered 2018-03-13 – 2018-03-14 (×2): 4 mg via INTRAVENOUS
  Filled 2018-03-13 (×2): qty 1

## 2018-03-13 NOTE — Progress Notes (Signed)
Pt just got onto the unit  And [facility].

## 2018-03-13 NOTE — Progress Notes (Signed)
Spoke with Romeo AppleBen, PharmD regarding IVIG for patient.  Romeo AppleBen states that Dr. Pilar PlateBero is okay with waiting until patient gets to the floor to start medication.  Romeo AppleBen will place consult once patient gets to floor to coordinate with IV team to hang IVIG.  Gasper LloydKerry Oziel Beitler, RN VAST

## 2018-03-13 NOTE — Progress Notes (Signed)
IVIG Picked up from Pharmacy around 2300 IV team consulted x 2  . Rn awaiting response from IV team to start IVIG

## 2018-03-13 NOTE — ED Notes (Signed)
Advised by pharmacy to hold Immune Globulin until after admission to floor.

## 2018-03-13 NOTE — H&P (Addendum)
History and Physical  [name] WGN:[id number] DOB: 06/09/1975 DOA: 03/13/2018 1510  Referring physician: Randol KernBero M [facility])  PCP: [name], [name], MD  Outpatient Specialists: Cyndie ChimeGranfortuna (hematology)  HISTORY   Chief Complaint: gum bleeding, leg rash (petechiae)  HPI: [name] is a 42 y.o. female with hx of chronic ITP s/p splenectomy with acute ITP flare in 2017 who [facility] with subacute leg rash and more acute headache and gum bleeding. Patient reports that about 3 weeks ago, noted small red dots on both lower extremities and then progressive development of spontaneous bruising (without any trauma) over her upper thighs. In the past few days, she has noticed gum bleeding and worsening frontal headaches. Only mildly alleviated with tylenol. She also has noticed blurry vision at the left far side of her peripheral vision x 2 days. No menses (unclear when last menses) and no vaginal bleeding. Her hospital course in 2017 was complicated by refractory thrombocytopenia despite high dose steroids and IVIG and [facility] (see below Assessment/Plan). She has since been on weekly N-plate therapy, which was weaned off sometime in late 2017 and has since had no bleeding issues until recently. She also reports chronic left sided abdominal pain (aching/cramping discomfort), positional in nature, since her splenectomy which has not changed significantly. Of note, history was obtained from daughter who requested to translate for the patient and from the chart.   Review of Systems:  + L peripheral blurry vision + headaches + gum bleeding + petechial rash and bruising + mild nausea - no vaginal bleeding - no fevers/chills - no cough - no chest pain, dyspnea on exertion - no edema, PND, orthopnea - no vomiting; no tarry, melanotic or bloody stools - no diarrhea - no dysuria, increased urinary frequency - no weight changes  Rest of systems  reviewed are negative, except as per above history.   ED course:  Vitals Blood pressure 113/62, pulse 72, temperature 98.2 F (36.8 C), temperature source Oral, resp. rate 15, height 4\' 10"  (1.473 m), weight 112 kg, SpO2 99 %, currently breastfeeding. Received decadron 40mg  PO x 1; NS bolus 1L x1; underwent CT abd/pelvis: no acute abnormalities. CT head showed new small subarachnoid hemorrhage (see report below).   Past Medical History:  Diagnosis Date  . Depression   . 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 BREAST SURGERY  biopsy  . SPLENECTOMY, TOTAL N/A 09/16/2015   Procedure: OPEN SPLENECTOMY;  Surgeon: Chevis PrettyPaul Toth III, MD;  Location: [facility] OR;  Service: General;  Laterality: N/A;    Social History:  reports that she has never smoked. She has never used smokeless tobacco. She reports that she does not drink alcohol or use drugs.  No Known Allergies  History reviewed. No pertinent family history.    Prior to Admission medications   Medication Sig Start Date End Date Taking? Authorizing Provider  acetaminophen (TYLENOL) 500 MG tablet Take 1-2 tablets (500-1,000 mg total) by mouth every 6 (six) hours as needed for mild pain or moderate pain. Patient taking differently: Take 500 mg by mouth daily as needed for mild pain or moderate pain.  09/22/15  Yes Burns, Tinnie GensAlexa R, MD    PHYSICAL EXAM   Temp:  [98.2 F (36.8 C)] 98.2 F (36.8 C) (12/25 1518) Pulse Rate:  [69-81] 72 (12/25 1800) Cardiac Rhythm: Normal sinus rhythm (12/25 1811) Resp:  [  13-19] 15 (12/25 1800) BP: (88-117)/(43-98) 113/62 (12/25 1800) SpO2:  [97 %-99 %] 99 % (12/25 1800) Weight:  [086[112 kg] 112 kg (12/25 1535)  BP 113/62   Pulse 72   Temp 98.2 F (36.8 C) (Oral)   Resp 15   Ht 4\' 10"  (1.473 m)   Wt 112 kg   SpO2 99%   BMI 51.62 kg/m    GEN obese middle-aged hispanic female; Spanish speaking, occasional  English words; accompanied by daughter; not in acute distress; tearful during conversation  HEENT NCAT EOM intact PERRL; clear oropharynx, no cervical LAD; moist mucus membranes  + marked bleeding around gums; no conjunctival hemorrhages  JVP estimated 5 cm H2O above RA; no HJR ; no carotid bruits b/l ;  CV regular normal rate; normal S1 and S2; no m/r/g or S3/S4; PMI non displaced; no parasternal heave  RESP CTA b/l; breathing unlabored and symmetric  ABD soft, mildly tender to palpation over LUQ; well healed surgical scar LUQ; +normoactive BS  EXT warm throughout b/l; no peripheral edema b/l  PULSES  DP and radials 2+ intact b/l  SKIN/MSK  Scattered petechiae throughout including extremities, torso, abdomen; bruising on upper thighs;  NEURO/PSYCH AAOx4; subtle L hemianopsia (patient reports "blurred" on far left side); no motor deficits; no sensory deficits; no facial droop; no dysarthria   DATA   LABS ON ADMISSION:  Basic Metabolic Panel: Recent Labs  Lab 03/13/18 1635  NA 139  K 3.4*  CL 105  CO2 25  GLUCOSE 96  BUN 10  CREATININE 0.57  CALCIUM 9.2   CBC: Recent Labs  Lab 03/13/18 1635  WBC 14.8*  HGB 11.8*  HCT 37.4  MCV 88.6  PLT <5*   Liver Function Tests: Recent Labs  Lab 03/13/18 1635  AST 18  ALT 20  ALKPHOS 60  BILITOT 0.4  PROT 7.1  ALBUMIN 3.8   Recent Labs  Lab 03/13/18 1635  LIPASE 43   No results for input(s): AMMONIA in the last 168 hours. Coagulation:  Lab Results  Component Value Date   INR 1.05 03/13/2018   INR 1.34 09/21/2015   INR 1.21 08/28/2015   No results found for: PTT Lactic Acid, Venous:  No results found for: LATICACIDVEN Cardiac Enzymes: No results for input(s): CKTOTAL, CKMB, CKMBINDEX, TROPONINI in the last 168 hours. Urinalysis:    Component Value Date/Time   COLORURINE YELLOW 02/04/2018 2350   APPEARANCEUR HAZY (A) 02/04/2018 2350   LABSPEC 1.024 02/04/2018 2350   PHURINE 6.0 02/04/2018 2350   GLUCOSEU  NEGATIVE 02/04/2018 2350   HGBUR MODERATE (A) 02/04/2018 2350   BILIRUBINUR NEGATIVE 02/04/2018 2350   BILIRUBINUR neg 10/12/2015 0852   KETONESUR NEGATIVE 02/04/2018 2350   PROTEINUR NEGATIVE 02/04/2018 2350   UROBILINOGEN 0.2 10/12/2015 0852   UROBILINOGEN 0.2 10/02/2011 1133   NITRITE NEGATIVE 02/04/2018 2350   LEUKOCYTESUR LARGE (A) 02/04/2018 2350    BNP (last 3 results) No results for input(s): PROBNP in the last 8760 hours. CBG: No results for input(s): GLUCAP in the last 168 hours.  Radiological Exams on Admission: Ct Head Wo Contrast  Result Date: 03/13/2018 CLINICAL DATA:  Bleeding from the gums with bruising of the legs, dyspnea, dizziness and petechiae over the chest. Bruising began 3 weeks bleeding of the gums with dyspnea and petechiae started yesterday. EXAM: CT HEAD WITHOUT CONTRAST TECHNIQUE: Contiguous axial images were obtained from the base of the skull through the vertex without intravenous contrast. COMPARISON:  09/20/2015 head CT and MRI from  09/21/2015 FINDINGS: Brain: Subtle serpiginous hyperdensities in the posterior parietal lobes bilaterally, right greater than left raise concern for small foci subarachnoid hemorrhage. No extra-axial fluid collections are noted. No midline shift, large vascular territory infarct, hydrocephalus no effacement of the basal cisterns. No intra-axial mass. Vascular: No hyperdense vessel or unexpected calcification. Skull: Normal. Negative for fracture or focal lesion. Sinuses/Orbits: No acute finding. Other: None. IMPRESSION: Subtle serpiginous hyperdensities in the posterior parietal lobes bilaterally, right greater than left consistent with small foci of subarachnoid hemorrhage. These results were called by telephone at the time of interpretation on 03/13/2018 at 6:02 pm to Dr. Kennis Carina , who verbally acknowledged these results. Electronically Signed   By: Tollie Eth M.D.   On: 03/13/2018 18:02   Ct Abdomen Pelvis W  Contrast  Result Date: 03/13/2018 CLINICAL DATA:  Shortness of breath and dizziness with particular over the chest region. Previous splenectomy. EXAM: CT ABDOMEN AND PELVIS WITH CONTRAST TECHNIQUE: Multidetector CT imaging of the abdomen and pelvis was performed using the standard protocol following bolus administration of intravenous contrast. CONTRAST:  OMNIPAQUE IOHEXOL 300 MG/ML  SOLN COMPARISON:  02/05/2018 FINDINGS: Lower chest: Lung bases are normal. Hepatobiliary: Mild diffuse low-attenuation of the liver without focal mass. Gallbladder and biliary tree are normal. Pancreas: Normal. Spleen: Previous splenectomy. Adrenals/Urinary Tract: Adrenal glands are normal. Kidneys are normal in size without hydronephrosis or nephrolithiasis. Ureters and bladder are normal. Stomach/Bowel: Stomach and small bowel are normal. Appendix is normal. Colon is normal. Vascular/Lymphatic: Normal. Reproductive: Normal. Other: No free fluid or focal inflammatory change. Musculoskeletal: Unchanged. IMPRESSION: No acute findings in the abdomen/pelvis. Previous splenectomy. Electronically Signed   By: Elberta Fortis M.D.   On: 03/13/2018 18:01   Dg Chest Port 1 View  Result Date: 03/13/2018 CLINICAL DATA:  Shortness of breath with dizziness since yesterday. Petechiae over chest. Previous splenectomy. EXAM: PORTABLE CHEST 1 VIEW COMPARISON:  09/18/2015 and 09/16/2015 FINDINGS: Lungs are hypoinflated without focal airspace consolidation or effusion. Cardiomediastinal silhouette is within normal. There prominence of the overlying soft tissues. Bony structures are normal. IMPRESSION: No acute cardiopulmonary disease. Electronically Signed   By: Elberta Fortis M.D.   On: 03/13/2018 15:50    EKG: Independently reviewed. NSR at 72 bpm (03/13/18)  I have reviewed the patient's previous electronic chart records, labs, and other data.   ASSESSMENT AND PLAN   Assessment: [name] is a 42 y.o. female with hx  of chronic ITP s/p splenectomy with acute ITP flare in 2017 who presents with recurrence of acute ITP with platelet count < 5 and new small subarachnoid hemorrhage. Very similar presentation to the 2017 episode (minus vaginal bleeding); that hospitalization was notable for thrombocytopenia refractory to several treatments including high dose steroids and IVIG. She was then transferred to [facility] and received rituxan, N-plate, and vincristine, among other therapies to finally achieve adequate platelet counts. It does not appear that she underwent plasmapheresis at that time.. Overnight will monitor closely given subarachnoid hemorrhage and initiate IV decadron + IVIG, followed by platelets. Her current leukocytosis is likely reactive in setting of acute ITP; no focal infectious sx. Depending on course and hematology recommendations, will consider transfer to [facility] if needed for patient care given her prior course.    Active Problems:   Acute ITP (HCC)   Plan:   # Acute thrombocytopenia due to ITP flare (complicated history in 2017) with small subarachnoid hemorrhage on initial CT head.  >  Initial platelet count < 5 - start IV  decadron 4mg  q6h - IVIG dose tonight - platelets transfusion after IVIG - hematology consulted - neurosurg consulted as below - prn IV zofran, tylenol, and prn oxycodone for pain - spot fluid bolus for BP goal > systolics 110 - if platelets counts do not improve, tentative plan to transfer to [facility] pending discussion with hematology  # Small subarachnoid hemorrhage likely spontaneous in setting of acute ITP > subtle L hemianopsia (blurriness) on initial exam - neurosurg recs appreciated - continue steroids and IVIG as above - zofran and pain control as above - neurochecks q4h  - low threshold to re-image CT head if exam changes  # Hypokalemia K 3.4 - repleting overnight with PO K 40 mEq   # Chronic LUQ pain since splenectomy -- likely MSK in nature > CT abd/pelvis  unremarkable  - continue to monitor, pain control as above  DVT Prophylaxis: none given thrombocytopenia Code Status:  Full code Family Communication: patient and daughter Bridget Hartshorn) at bedside  Disposition Plan: admit to stepdown; tentative plan for transfer to [facility] tomorrow  Patient contact: Extended Emergency Contact Information Primary Emergency Contact: Comer Locket Address: [address]          [place], [place] [address] [place] Home Phone: [phone] Relation: Spouse Secondary Emergency Contact: Pablo Ledger [place] Mobile Phone: [phone] Relation: Daughter  Time spent: > 35 mins  Ike Bene, MD [facility] Pager [phone]  If 7PM-7AM, please contact night-coverage [email or web address] Password [facility] 03/13/2018, 7:19 PM

## 2018-03-13 NOTE — ED Triage Notes (Signed)
Pt from home, w/ a c/o bleeding from the gums, bruising in the legs, SOB, dizziness, and petechia over her chest. The bruising began 3 weeks ago and the bleeding, SOB, and petechia began yesterday. Two years ago the pt had a similar problem and found that she had low platelets. Splenectomy in the summer. Additional complaint of a headache w/o nausea/vomiting.

## 2018-03-13 NOTE — ED Provider Notes (Signed)
[facility] Emergency Department Provider Note MRN:  [id number]  Arrival date & time: 03/13/18     Chief Complaint   Bleeding History of Present Illness   [name] is a 42 y.o. year-old female with a history of refractory ITP presenting to the ED with chief complaint of bleeding.  Patient noticed a rash to her legs bilaterally 3 weeks ago.  Today noticed bleeding of the gums.  This is happened before, when she was diagnosed with ITP.  Her condition was difficult to treat and, was complicated by spontaneous subarachnoid hemorrhage.  Today complaining of headache.  Also endorsing left upper quadrant pain ever since she had her spleen removed this past summer.  Denies fever, no nausea, no vomiting, no chest pain or shortness of breath.  No exacerbating relieving factors.  Review of Systems  A complete 10 system review of systems was obtained and all systems are negative except as noted in the HPI and PMH.   Patient's Health History    Past Medical History:  Diagnosis Date  . Chronic ITP (idiopathic thrombocytopenia) (HCC)   . Depression   . 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 BREAST SURGERY  biopsy  . SPLENECTOMY, TOTAL N/A 09/16/2015   Procedure: OPEN SPLENECTOMY;  Surgeon: Chevis PrettyPaul Toth III, MD;  Location: [facility] OR;  Service: General;  Laterality: N/A;    History reviewed. No pertinent family history.  Social History   Socioeconomic History  . Marital status: Married    Spouse name: Not on file  . Number of children: Not on file  . Years of education: Not on file  . Highest education level: Not on file  Occupational History  . Not on file  Social Needs  . Financial resource strain: Not on file  . Food insecurity:    Worry: Not on file    Inability: Not on file  . Transportation needs:    Medical: Not on file    Non-medical: Not on file    Tobacco Use  . Smoking status: Never Smoker  . Smokeless tobacco: Never Used  Substance and Sexual Activity  . Alcohol use: No    Alcohol/week: 0.0 standard drinks  . Drug use: No  . Sexual activity: Not on file  Lifestyle  . Physical activity:    Days per week: Not on file    Minutes per session: Not on file  . Stress: Not on file  Relationships  . Social connections:    Talks on phone: Not on file    Gets together: Not on file    Attends religious service: Not on file    Active member of club or organization: Not on file    Attends meetings of clubs or organizations: Not on file    Relationship status: Not on file  . Intimate partner violence:    Fear of current or ex partner: Not on file    Emotionally abused: Not on file    Physically abused: Not on file    Forced sexual activity: Not on file  Other Topics Concern  . Not on file  Social History Narrative  . Not on file     Physical Exam  Vital Signs and Nursing Notes reviewed Vitals:   03/13/18 2000 03/13/18 2103  BP: (!) 108/95 (!) 111/49  Pulse: 78 72  Resp: 17 18  Temp:  98.9 F (37.2  C)  SpO2: 99% 99%    CONSTITUTIONAL: Well-appearing, NAD NEURO:  Alert and oriented x 3, no focal deficits EYES:  eyes equal and reactive ENT/NECK:  no LAD, no JVD; mild bleeding to gingiva diffusely CARDIO:   rate, well-perfused, normal S1 and S2 PULM:  CTAB no wheezing or rhonchi GI/GU:  normal bowel sounds, non-distended, non-tender MSK/SPINE:  No gross deformities, no edema SKIN: Petechiae to bilateral legs PSYCH:  Appropriate speech and behavior  Diagnostic and Interventional Summary    EKG Interpretation  Date/Time:  Wednesday March 13 2018 17:01:24 EST Ventricular Rate:  72 PR Interval:    QRS Duration: 96 QT Interval:  392 QTC Calculation: 429 R Axis:   60 Text Interpretation:  Sinus rhythm Confirmed by Kennis CarinaBero, Finneus Kaneshiro [phone] on 03/13/2018 5:11:49 PM      Labs Reviewed  CBC - Abnormal; Notable for  the following components:      Result Value   WBC 14.8 (*)    Hemoglobin 11.8 (*)    Platelets <5 (*)    All other components within normal limits  COMPREHENSIVE METABOLIC PANEL - Abnormal; Notable for the following components:   Potassium 3.4 (*)    All other components within normal limits  LIPASE, BLOOD  PROTIME-INR  APTT  HCG, SERUM, QUALITATIVE  LACTIC ACID, PLASMA  CBC WITH DIFFERENTIAL/PLATELET  BASIC METABOLIC PANEL  TYPE AND SCREEN  PREPARE PLATELET PHERESIS    CT HEAD WO CONTRAST  Final Result    CT ABDOMEN PELVIS W CONTRAST  Final Result    DG Chest Port 1 View  Final Result      Medications  Immune Globulin 10% (PRIVIGEN) IV infusion 110 g (has no administration in time range)  dexamethasone (DECADRON) injection 4 mg (has no administration in time range)  ondansetron (ZOFRAN) injection 4 mg (has no administration in time range)  acetaminophen (TYLENOL) tablet 650 mg (has no administration in time range)  oxyCODONE (Oxy IR/ROXICODONE) immediate release tablet 10 mg (has no administration in time range)  0.9 %  sodium chloride infusion (Manually program via Guardrails IV Fluids) (has no administration in time range)  potassium chloride SA (K-DUR,KLOR-CON) CR tablet 40 mEq (has no administration in time range)  sodium chloride 0.9 % bolus 1,000 mL (0 mLs Intravenous Stopped 03/13/18 1806)  iohexol (OMNIPAQUE) 300 MG/ML solution 100 mL (100 mLs Intravenous Contrast Given 03/13/18 1742)  dexamethasone (DECADRON) tablet 40 mg (40 mg Oral Given 03/13/18 1913)     Procedures Critical Care Critical Care Documentation Critical care time provided by me (excluding procedures): 36 minutes  Condition necessitating critical care: ITP with critically low platelets, bilateral spontaneous subarachnoid hemorrhage  Components of critical care management: reviewing of prior records, laboratory and imaging interpretation, frequent re-examination and reassessment of vital signs,  administration of high-dose dexamethasone, IVIG, discussion with consulting services.    ED Course and Medical Decision Making  I have reviewed the triage vital signs and the nursing notes.  Pertinent labs & imaging results that were available during my care of the patient were reviewed by me and considered in my medical decision making (see below for details).  Concern for recurrence of ITP in this 42 year old female with history of the same.  Also concern for intracranial bleeding given history of the same.  Patient is hypotensive on my initial evaluation, systolics in the 80s.  Will provide IV fluids, monitor closely, obtain labs and CT imaging of the head and abdomen given her left upper quadrant pain.  Platelets  less than 5.  Patient's blood pressure is fluid responsive.  CT abdomen unremarkable, CT head reveals bilateral small spontaneous subarachnoid hemorrhage.  No shift, no edema.  Discussed with hematology for recommendations, given high-dose dexamethasone, IVIG ordered.  Unit of platelets and option for severe or life-threatening bleeding, more appropriate if patient were to have a more concerning neurological exam.  Attempted to consult neurosurgery, did not hear back from them.  Admitted to hospital service for further care and evaluation.  Elmer Sow. Pilar Plate, MD [facility] Emergency Medicine [facility] [email or web address]  Final Clinical Impressions(s) / ED Diagnoses     ICD-10-CM   1. Acute ITP (HCC) D69.3   2. SOB (shortness of breath) R06.02 DG Chest [facility] 1 View    DG Chest [place] 1 View  3. Subarachnoidal hemorrhage [facility]) I60.9     ED Discharge Orders    None         Sabas Sous, MD 03/13/18 2110

## 2018-03-14 ENCOUNTER — Encounter (HOSPITAL_COMMUNITY): Payer: Self-pay | Admitting: *Deleted

## 2018-03-14 ENCOUNTER — Other Ambulatory Visit: Payer: Self-pay | Admitting: Hematology

## 2018-03-14 DIAGNOSIS — I609 Nontraumatic subarachnoid hemorrhage, unspecified: Secondary | ICD-10-CM

## 2018-03-14 LAB — CBC
HCT: 37.4 % (ref 36.0–46.0)
HCT: 35.7 % — ABNORMAL LOW (ref 36.0–46.0)
HEMOGLOBIN: 11.8 g/dL — AB (ref 12.0–15.0)
Hemoglobin: 11.9 g/dL — ABNORMAL LOW (ref 12.0–15.0)
MCH: 28 pg (ref 26.0–34.0)
MCH: 29 pg (ref 26.0–34.0)
MCHC: 31.6 g/dL (ref 30.0–36.0)
MCHC: 33.3 g/dL (ref 30.0–36.0)
MCV: 88.6 fL (ref 80.0–100.0)
MCV: 86.9 fL (ref 80.0–100.0)
Platelets: 65 10*3/uL — ABNORMAL LOW (ref 150–400)
RBC: 4.22 MIL/uL (ref 3.87–5.11)
RBC: 4.11 MIL/uL (ref 3.87–5.11)
RDW: 14.4 % (ref 11.5–15.5)
RDW: 14.1 % (ref 11.5–15.5)
WBC: 14.8 10*3/uL — AB (ref 4.0–10.5)
WBC: 20.3 10*3/uL — ABNORMAL HIGH (ref 4.0–10.5)
nRBC: 0 % (ref 0.0–0.2)
nRBC: 0.1 % (ref 0.0–0.2)

## 2018-03-14 LAB — CBC WITH DIFFERENTIAL/PLATELET
Abs Immature Granulocytes: 0.25 10*3/uL — ABNORMAL HIGH (ref 0.00–0.07)
Basophils Absolute: 0 10*3/uL (ref 0.0–0.1)
Basophils Relative: 0 %
EOS ABS: 0 10*3/uL (ref 0.0–0.5)
Eosinophils Relative: 0 %
HCT: 37.8 % (ref 36.0–46.0)
Hemoglobin: 12.3 g/dL (ref 12.0–15.0)
Immature Granulocytes: 2 %
Lymphocytes Relative: 11 %
Lymphs Abs: 1.6 10*3/uL (ref 0.7–4.0)
MCH: 28.5 pg (ref 26.0–34.0)
MCHC: 32.5 g/dL (ref 30.0–36.0)
MCV: 87.5 fL (ref 80.0–100.0)
Monocytes Absolute: 0.1 10*3/uL (ref 0.1–1.0)
Monocytes Relative: 1 %
Neutro Abs: 13.6 10*3/uL — ABNORMAL HIGH (ref 1.7–7.7)
Neutrophils Relative %: 86 %
Platelets: 7 10*3/uL — CL (ref 150–400)
RBC: 4.32 MIL/uL (ref 3.87–5.11)
RDW: 14.3 % (ref 11.5–15.5)
WBC: 15.6 10*3/uL — ABNORMAL HIGH (ref 4.0–10.5)
nRBC: 0.1 % (ref 0.0–0.2)

## 2018-03-14 LAB — BASIC METABOLIC PANEL
Anion gap: 12 (ref 5–15)
BUN: 10 mg/dL (ref 6–20)
CO2: 18 mmol/L — ABNORMAL LOW (ref 22–32)
Calcium: 8.7 mg/dL — ABNORMAL LOW (ref 8.9–10.3)
Chloride: 104 mmol/L (ref 98–111)
Creatinine, Ser: 0.67 mg/dL (ref 0.44–1.00)
GFR calc Af Amer: >60 mL/min (ref >60)
GFR calc non Af Amer: >60 mL/min (ref >60)
Glucose, Bld: 186 mg/dL — ABNORMAL HIGH (ref 70–99)
Potassium: 4.1 mmol/L (ref 3.5–5.1)
Sodium: 134 mmol/L — ABNORMAL LOW (ref 135–145)

## 2018-03-14 MED ORDER — SODIUM CHLORIDE 0.9 % IV SOLN
INTRAVENOUS | Status: AC
  Administered 2018-03-14: 14:00:00 via INTRAVENOUS

## 2018-03-14 MED ORDER — INFLUENZA VAC SPLIT QUAD 0.5 ML IM SUSY
0.5000 mL | PREFILLED_SYRINGE | INTRAMUSCULAR | Status: DC
  Filled 2018-03-14: qty 0.5

## 2018-03-14 MED ORDER — ROMIPLOSTIM 250 MCG ~~LOC~~ SOLR
2.0000 ug/kg | SUBCUTANEOUS | Status: DC
  Administered 2018-03-14: 220 ug via SUBCUTANEOUS
  Filled 2018-03-14: qty 0.44

## 2018-03-14 MED ORDER — IMMUNE GLOBULIN (HUMAN) 20 GM/200ML IV SOLN
1.0000 g/kg | Freq: Once | INTRAVENOUS | Status: AC
  Administered 2018-03-14: 110 g via INTRAVENOUS
  Filled 2018-03-14: qty 100

## 2018-03-14 MED ORDER — DEXAMETHASONE 6 MG PO TABS
40.0000 mg | ORAL_TABLET | Freq: Every day | ORAL | Status: AC
  Administered 2018-03-14: 40 mg via ORAL
  Administered 2018-03-15 – 2018-03-16 (×2): 39 mg via ORAL
  Filled 2018-03-14 (×2): qty 10
  Filled 2018-03-14 (×2): qty 6.5

## 2018-03-14 MED ORDER — SODIUM CHLORIDE 0.9% IV SOLUTION
Freq: Once | INTRAVENOUS | Status: AC
  Administered 2018-03-14: 04:00:00 via INTRAVENOUS

## 2018-03-14 NOTE — Progress Notes (Signed)
Pt tolerating IVIG max rate   & maintained at current rate 264 ml/hr till completed. vs remaine stable

## 2018-03-14 NOTE — Progress Notes (Signed)
PROGRESS NOTE    [name]  ZOX:[id number] DOB: [birth year] DOA: 03/13/2018 PCP: Gayla Doss., MD   Brief Narrative:  HPI on 03/13/2018 by Dr. Koren Bound [name] is a 42 y.o. female with hx of chronic ITP s/p splenectomy with acute ITP flare in 2017 who presents to [facility] ED with subacute leg rash and more acute headache and gum bleeding. Patient reports that about 3 weeks ago, noted small red dots on both lower extremities and then progressive development of spontaneous bruising (without any trauma) over her upper thighs. In the past few days, she has noticed gum bleeding and worsening frontal headaches. Only mildly alleviated with tylenol. She also has noticed blurry vision at the left far side of her peripheral vision x 2 days. No menses (unclear when last menses) and no vaginal bleeding. Her hospital course in 2017 was complicated by refractory thrombocytopenia despite high dose steroids and IVIG and eventual transfer to [facility] (see below Assessment/Plan). She has since been on weekly N-plate therapy, which was weaned off sometime in late 2017 and has since had no bleeding issues until recently. She also reports chronic left sided abdominal pain (aching/cramping discomfort), positional in nature, since her splenectomy which has not changed significantly. Of note, history was obtained from daughter who requested to translate for the patient and from the chart.   Interim history Admitted with acute thrombocytopenia and ITP flare along with subarachnoid hemorrhage.  Hematology oncology consulted and following.  Assessment & Plan   Acute thrombocytopenia secondary to ITP flare -Patient was noted to have similar presentation back in 2017 and was following with hematology -Upon presentation, her initial platelet count was less than 5 -Hematology oncology consulted and appreciated.  Discussed with Dr. Mosetta Putt, recommended starting IVIG as well as platelet transfusion and  IV Decadron. -Platelets currently 65 -Continue to monitor CBC  Small subarachnoid hemorrhage -Suspect spontaneous in the setting of acute ITP -CT head: Subtle serpiginous  -Neurosurgery was consulted by EDP -Continue neuro checks -Continue Decadron, IVIG -Will likely need repeat CT scan in a couple of days -Currently no neurological deficits   Hypokalemia -Resolved with repletion, continue to monitor BMP  Chronic LUQ pain -Has been occurring since splenectomy, suspect musculoskeletal in nature -CT abdomen pelvis unremarkable -Continue pain control  DVT Prophylaxis  SCDs  Code Status: Full  Family Communication: None at bedside  Disposition Plan: Admitted. Continue to treat ITP- pending further hematology recommendations. Suspect home when medically stable.   Consultants Hematology/oncology Neurosurgery  Procedures  None  Antibiotics   Anti-infectives (From admission, onward)   None      Subjective:   [name] seen and examined today with the use of a Nurse, learning disability.  No complaints.  She does state that she feels dizzy but this has been a chronic issue along with blurry left-sided vision (for 2 years).  She denies any current chest pain, shortness of breath, abdominal pain, nausea or vomiting, diarrhea or constipation, headache.  Objective:   Vitals:   03/14/18 1336 03/14/18 1459 03/14/18 1529 03/14/18 1544  BP: (!) 93/48 106/64 (!) 103/59 97/65  Pulse: 97  (!) 102 97  Resp: (!) 21 20 (!) 21 (!) 21  Temp: 98.7 F (37.1 C) 98.7 F (37.1 C) 98.2 F (36.8 C) 98.7 F (37.1 C)  TempSrc: Oral Oral Oral Oral  SpO2: 94% 96% 94% 94%  Weight:      Height:        Intake/Output Summary (Last 24 hours) at 03/14/2018 1557 Last  data filed at 03/14/2018 0601 Gross per 24 hour  Intake 2220 ml  Output 550 ml  Net 1670 ml   Filed Weights   03/13/18 1535 03/13/18 2103  Weight: 112 kg 110.5 kg    Exam  General: Well developed, well nourished, NAD,  appears stated age  HEENT: NCAT, mucous membranes moist.   Neck: Supple  Cardiovascular: S1 S2 auscultated, RRR, no murmur  Respiratory: Clear to auscultation bilaterally with equal chest rise  Abdomen: Soft, mild LUQ TTP, nondistended, + bowel sounds  Extremities: warm dry without cyanosis clubbing or edema  Neuro: AAOx3, no new deficits  Skin: Scattered petechiae on extremities, torso, abdomen and thighs  Psych: Normal affect and demeanor with intact judgement and insight   Data Reviewed: I have personally reviewed following labs and imaging studies  CBC: Recent Labs  Lab 03/13/18 1635 03/14/18 0439 03/14/18 0838  WBC 14.8* 15.6* 20.3*  NEUTROABS  --  13.6*  --   HGB 11.8* 12.3 11.9*  HCT 37.4 37.8 35.7*  MCV 88.6 87.5 86.9  PLT <5* 7* 65*   Basic Metabolic Panel: Recent Labs  Lab 03/13/18 1635 03/14/18 0439  NA 139 134*  K 3.4* 4.1  CL 105 104  CO2 25 18*  GLUCOSE 96 186*  BUN 10 10  CREATININE 0.57 0.67  CALCIUM 9.2 8.7*   GFR: Estimated Creatinine Clearance: 102.3 mL/min (by C-G formula based on SCr of 0.67 mg/dL). Liver Function Tests: Recent Labs  Lab 03/13/18 1635  AST 18  ALT 20  ALKPHOS 60  BILITOT 0.4  PROT 7.1  ALBUMIN 3.8   Recent Labs  Lab 03/13/18 1635  LIPASE 43   No results for input(s): AMMONIA in the last 168 hours. Coagulation Profile: Recent Labs  Lab 03/13/18 1635  INR 1.05   Cardiac Enzymes: No results for input(s): CKTOTAL, CKMB, CKMBINDEX, TROPONINI in the last 168 hours. BNP (last 3 results) No results for input(s): PROBNP in the last 8760 hours. HbA1C: No results for input(s): HGBA1C in the last 72 hours. CBG: No results for input(s): GLUCAP in the last 168 hours. Lipid Profile: No results for input(s): CHOL, HDL, LDLCALC, TRIG, CHOLHDL, LDLDIRECT in the last 72 hours. Thyroid Function Tests: No results for input(s): TSH, T4TOTAL, FREET4, T3FREE, THYROIDAB in the last 72 hours. Anemia Panel: No results  for input(s): VITAMINB12, FOLATE, FERRITIN, TIBC, IRON, RETICCTPCT in the last 72 hours. Urine analysis:    Component Value Date/Time   COLORURINE YELLOW 02/04/2018 2350   APPEARANCEUR HAZY (A) 02/04/2018 2350   LABSPEC 1.024 02/04/2018 2350   PHURINE 6.0 02/04/2018 2350   GLUCOSEU NEGATIVE 02/04/2018 2350   HGBUR MODERATE (A) 02/04/2018 2350   BILIRUBINUR NEGATIVE 02/04/2018 2350   BILIRUBINUR neg 10/12/2015 0852   KETONESUR NEGATIVE 02/04/2018 2350   PROTEINUR NEGATIVE 02/04/2018 2350   UROBILINOGEN 0.2 10/12/2015 0852   UROBILINOGEN 0.2 10/02/2011 1133   NITRITE NEGATIVE 02/04/2018 2350   LEUKOCYTESUR LARGE (A) 02/04/2018 2350   Sepsis Labs: @LABRCNTIP (procalcitonin:4,lacticidven:4)  )No results found for this or any previous visit (from the past 240 hour(s)).    Radiology Studies: Ct Head Wo Contrast  Result Date: 03/13/2018 CLINICAL DATA:  Bleeding from the gums with bruising of the legs, dyspnea, dizziness and petechiae over the chest. Bruising began 3 weeks bleeding of the gums with dyspnea and petechiae started yesterday. EXAM: CT HEAD WITHOUT CONTRAST TECHNIQUE: Contiguous axial images were obtained from the base of the skull through the vertex without intravenous contrast. COMPARISON:  09/20/2015  head CT and MRI from 09/21/2015 FINDINGS: Brain: Subtle serpiginous hyperdensities in the posterior parietal lobes bilaterally, right greater than left raise concern for small foci subarachnoid hemorrhage. No extra-axial fluid collections are noted. No midline shift, large vascular territory infarct, hydrocephalus no effacement of the basal cisterns. No intra-axial mass. Vascular: No hyperdense vessel or unexpected calcification. Skull: Normal. Negative for fracture or focal lesion. Sinuses/Orbits: No acute finding. Other: None. IMPRESSION: Subtle serpiginous hyperdensities in the posterior parietal lobes bilaterally, right greater than left consistent with small foci of subarachnoid  hemorrhage. These results were called by telephone at the time of interpretation on 03/13/2018 at 6:02 pm to Dr. Kennis CarinaMICHAEL BERO , who verbally acknowledged these results. Electronically Signed   By: Tollie Ethavid  Kwon M.D.   On: 03/13/2018 18:02   Ct Abdomen Pelvis W Contrast  Result Date: 03/13/2018 CLINICAL DATA:  Shortness of breath and dizziness with particular over the chest region. Previous splenectomy. EXAM: CT ABDOMEN AND PELVIS WITH CONTRAST TECHNIQUE: Multidetector CT imaging of the abdomen and pelvis was performed using the standard protocol following bolus administration of intravenous contrast. CONTRAST:  100mL OMNIPAQUE IOHEXOL 300 MG/ML  SOLN COMPARISON:  02/05/2018 FINDINGS: Lower chest: Lung bases are normal. Hepatobiliary: Mild diffuse low-attenuation of the liver without focal mass. Gallbladder and biliary tree are normal. Pancreas: Normal. Spleen: Previous splenectomy. Adrenals/Urinary Tract: Adrenal glands are normal. Kidneys are normal in size without hydronephrosis or nephrolithiasis. Ureters and bladder are normal. Stomach/Bowel: Stomach and small bowel are normal. Appendix is normal. Colon is normal. Vascular/Lymphatic: Normal. Reproductive: Normal. Other: No free fluid or focal inflammatory change. Musculoskeletal: Unchanged. IMPRESSION: No acute findings in the abdomen/pelvis. Previous splenectomy. Electronically Signed   By: Elberta Fortisaniel  Boyle M.D.   On: 03/13/2018 18:01   Dg Chest Port 1 View  Result Date: 03/13/2018 CLINICAL DATA:  Shortness of breath with dizziness since yesterday. Petechiae over chest. Previous splenectomy. EXAM: PORTABLE CHEST 1 VIEW COMPARISON:  09/18/2015 and 09/16/2015 FINDINGS: Lungs are hypoinflated without focal airspace consolidation or effusion. Cardiomediastinal silhouette is within normal. There prominence of the overlying soft tissues. Bony structures are normal. IMPRESSION: No acute cardiopulmonary disease. Electronically Signed   By: Elberta Fortisaniel  Boyle M.D.   On:  03/13/2018 15:50     Scheduled Meds: . dexamethasone  39 mg Oral Daily  . [START ON 03/15/2018] Influenza vac split quadrivalent PF  0.5 mL Intramuscular Tomorrow-1000  . romiPLOStim  2 mcg/kg Subcutaneous Weekly   Continuous Infusions:   LOS: 1 day   Time Spent in minutes   45 minutes (greater than 50% of time spent with patient face to face, as well as reviewing old records, calling consults, and formulating a plan)  Cristopher Ciccarelli D.O. on 03/14/2018 at 3:57 PM  Between 7am to 7pm - Please see pager noted on [email or web address]  After 7pm go to [email or web address]  And look for the night coverage person covering for me after hours  [facility] Office  [phone]

## 2018-03-14 NOTE — Progress Notes (Signed)
First tunit of Platelet phresis  Unit #   [id number]  Grp O  Rh positive  started after IVIG given . Education done Pt tolerating  Well no distress observed 15 minute post transfusion stable.

## 2018-03-14 NOTE — Progress Notes (Signed)
1 unit Platelet completed  Pt need one more unit no reaction noted  Vs stable

## 2018-03-14 NOTE — Consult Note (Signed)
[facility]  [phone]) [phone]   HEMATOLOGY ONCOLOGY INPATIENT CONSULTATION   [name]  DOB: 05/01/1975  MR#: [id number]  CSN#: [id number]    Requesting Physician: [facility]  Patient Care Team: [name], [name], MD as PCP - General (General Practice) Levert FeinsteinGranfortuna, James M, MD as Consulting Physician (Oncology) Gardiner Coinswen, John (Hematology and Oncology) Elwin MochaShah, Christopher Tulip, MD as Consulting Physician (Ophthalmology) Justice DeedsMetjian, Ara D, MD (Internal Medicine)  Reason for consult: recurrent ITP   History of present illness:   Pt is a 42 yo Spanish-speaking female, with past medical history of refractory ITP in 2017, presented to the hospital with petechia and abdominal discomfort yesterday.   [facility], with severe thrombocytopenia, vaginal bleeding, and intracranial hemorrhage.  She was treated with dexamethasone 40 mg IV for 4 days, high-dose IVIG, and Nplate, and Rituxan.  She was also treated with Amicar.  Unfortunately she did not respond to treatment, and underwent splenectomy, and 1 dose chemotherapy vincristine.  She was in the hospital for 25 days, and was [facility].  She finally responded well, and platelet normalized.  She saw Dr. Cyndie ChimeGranfortuna for f/u and Nplate was subsequently stopped due to high platelet.  She lost follow-up afterwards.  She presented to the emergency room yesterday with petechia for a few weeks, gum bleeding for a few days and blurry vision for 2 days.  CBC showed platelet less than 5, the rest of CBC were normal.  PT, APTT were normal.  Due to her abdominal pain CT of abdomen and pelvis was obtained, which was unremarkable.  CTA head without contrast showed small focus of subarachnoid hemorrhage.  I spoke with the ED physician yesterday, and started her on dexamethasone 40 mg oral daily, she received 1 dose IVIG 40 g last night, her platelet was 7  this morning after the above treatment.  She also received 1 unit of platelet this morning, after CBC was drawn.  MEDICAL HISTORY:  Past Medical History:  Diagnosis Date  . Anemia   . Chronic ITP (idiopathic thrombocytopenia) (HCC)   . Depression   . Headache   . 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 BREAST SURGERY  biopsy  . SPLENECTOMY, TOTAL N/A 09/16/2015   Procedure: OPEN SPLENECTOMY;  Surgeon: Chevis PrettyPaul Toth III, MD;  Location: [facility] OR;  Service: General;  Laterality: N/A;    SOCIAL HISTORY: Social History   Socioeconomic History  . Marital status: Married    Spouse name: Not on file  . Number of children: Not on file  . Years of education: Not on file  . Highest education level: Not on file  Occupational History  . Not on file  Social Needs  . Financial resource strain: Not on file  . Food insecurity:    Worry: Not on file    Inability: Not on file  . Transportation needs:    Medical: Not on file    Non-medical: Not on file  Tobacco Use  . Smoking status: Never Smoker  . Smokeless tobacco: Never Used  Substance and Sexual Activity  . Alcohol use: No    Alcohol/week: 0.0 standard drinks  . Drug use: No  . Sexual activity: Not on file  Lifestyle  . Physical activity:    Days per week: Not on file  Minutes per session: Not on file  . Stress: Not on file  Relationships  . Social connections:    Talks on phone: Not on file    Gets together: Not on file    Attends religious service: Not on file    Active member of club or organization: Not on file    Attends meetings of clubs or organizations: Not on file    Relationship status: Not on file  . Intimate partner violence:    Fear of current or ex partner: Not on file    Emotionally abused: Not on file    Physically abused: Not on file    Forced sexual activity: Not on file  Other Topics  Concern  . Not on file  Social History Narrative  . Not on file    FAMILY HISTORY: History reviewed. No pertinent family history.  ALLERGIES:  has No Known Allergies.  MEDICATIONS:  Current Facility-Administered Medications  Medication Dose Route Frequency Provider Last Rate Last Dose  . acetaminophen (TYLENOL) tablet 650 mg  650 mg Oral Q6H PRN Ike Bene, MD   650 mg at 03/14/18 0032  . dexamethasone (DECADRON) injection 4 mg  4 mg Intravenous Q8H Park, Bartholome Bill, MD   4 mg at 03/14/18 [phone]  . dexamethasone (DECADRON) tablet 40 mg  40 mg Oral Daily Malachy Mood, MD      . Immune Globulin 10% (PRIVIGEN) IV infusion 110 g  1 g/kg Intravenous Once Malachy Mood, MD      . Melene Muller ON 03/15/2018] Influenza vac split quadrivalent PF (FLUARIX) injection 0.5 mL  0.5 mL Intramuscular Tomorrow-1000 Mikhail, Nita Sells, DO      . ondansetron [facility]) injection 4 mg  4 mg Intravenous Q6H PRN Park, Bartholome Bill, MD      . oxyCODONE (Oxy IR/ROXICODONE) immediate release tablet 10 mg  10 mg Oral Q4H PRN Park, Bartholome Bill, MD      . romiPLOStim (NPLATE) injection 220 mcg  2 mcg/kg Subcutaneous Weekly Malachy Mood, MD        REVIEW OF SYSTEMS:   Constitutional: Denies fevers, chills or abnormal night sweats Eyes: Denies blurriness of vision, double vision or watery eyes Ears, nose, mouth, throat, and face: Denies mucositis or sore throat Respiratory: Denies cough, dyspnea or wheezes Cardiovascular: Denies palpitation, chest discomfort or lower extremity swelling Gastrointestinal:  Denies nausea, heartburn or change in bowel habits Skin: Denies abnormal skin rashes Lymphatics: Denies new lymphadenopathy or easy bruising Neurological:Denies numbness, tingling or new weaknesses Behavioral/Psych: Mood is stable, no new changes  All other systems were reviewed with the patient and are negative.  PHYSICAL EXAMINATION: ECOG PERFORMANCE STATUS: 1 - Symptomatic but completely ambulatory  Vitals:   03/14/18 0601  03/14/18 0616  BP: 96/63 92/60  Pulse: 89 92  Resp: 17 (!) 21  Temp: 98.8 F (37.1 C) 98.8 F (37.1 C)  SpO2:     Filed Weights   03/13/18 1535 03/13/18 2103  Weight: 247 lb (112 kg) 243 lb 9.7 oz (110.5 kg)    GENERAL:alert, no distress and comfortable SKIN: skin color, texture, turgor are normal, no rashes or significant lesions EYES: normal, conjunctiva are pink and non-injected, sclera clear OROPHARYNX:no exudate, no erythema and lips, buccal mucosa, and tongue normal  NECK: supple, thyroid normal size, non-tender, without nodularity LYMPH:  no palpable lymphadenopathy in the cervical, axillary or inguinal LUNGS: clear to auscultation and percussion with normal breathing effort HEART: regular rate & rhythm and no murmurs and no lower extremity  edema ABDOMEN:abdomen soft, non-tender and normal bowel sounds Musculoskeletal:no cyanosis of digits and no clubbing  PSYCH: alert & oriented x 3 with fluent speech NEURO: no focal motor/sensory deficits  LABORATORY DATA:  I have reviewed the data as listed Lab Results  Component Value Date   WBC 15.6 (H) 03/14/2018   HGB 12.3 03/14/2018   HCT 37.8 03/14/2018   MCV 87.5 03/14/2018   PLT 7 (LL) 03/14/2018   Recent Labs    02/04/18 2121 03/13/18 1635 03/14/18 0439  NA 137 139 134*  K 3.6 3.4* 4.1  CL 103 105 104  CO2 26 25 18*  GLUCOSE 107* 96 186*  BUN 11 10 10   CREATININE 0.66 0.57 0.67  CALCIUM 9.0 9.2 8.7*  GFRNONAA >60 >60 >60  GFRAA >60 >60 >60  PROT 7.3 7.1  --   ALBUMIN 3.8 3.8  --   AST 21 18  --   ALT 20 20  --   ALKPHOS 78 60  --   BILITOT 0.2* 0.4  --     RADIOGRAPHIC STUDIES: I have personally reviewed the radiological images as listed and agreed with the findings in the report. Ct Head Wo Contrast  Result Date: 03/13/2018 CLINICAL DATA:  Bleeding from the gums with bruising of the legs, dyspnea, dizziness and petechiae over the chest. Bruising began 3 weeks bleeding of the gums with dyspnea and  petechiae started yesterday. EXAM: CT HEAD WITHOUT CONTRAST TECHNIQUE: Contiguous axial images were obtained from the base of the skull through the vertex without intravenous contrast. COMPARISON:  09/20/2015 head CT and MRI from 09/21/2015 FINDINGS: Brain: Subtle serpiginous hyperdensities in the posterior parietal lobes bilaterally, right greater than left raise concern for small foci subarachnoid hemorrhage. No extra-axial fluid collections are noted. No midline shift, large vascular territory infarct, hydrocephalus no effacement of the basal cisterns. No intra-axial mass. Vascular: No hyperdense vessel or unexpected calcification. Skull: Normal. Negative for fracture or focal lesion. Sinuses/Orbits: No acute finding. Other: None. IMPRESSION: Subtle serpiginous hyperdensities in the posterior parietal lobes bilaterally, right greater than left consistent with small foci of subarachnoid hemorrhage. These results were called by telephone at the time of interpretation on 03/13/2018 at 6:02 pm to Dr. Kennis CarinaMICHAEL BERO , who verbally acknowledged these results. Electronically Signed   By: Tollie Ethavid  Kwon M.D.   On: 03/13/2018 18:02   Ct Abdomen Pelvis W Contrast  Result Date: 03/13/2018 CLINICAL DATA:  Shortness of breath and dizziness with particular over the chest region. Previous splenectomy. EXAM: CT ABDOMEN AND PELVIS WITH CONTRAST TECHNIQUE: Multidetector CT imaging of the abdomen and pelvis was performed using the standard protocol following bolus administration of intravenous contrast. CONTRAST:  100mL OMNIPAQUE IOHEXOL 300 MG/ML  SOLN COMPARISON:  02/05/2018 FINDINGS: Lower chest: Lung bases are normal. Hepatobiliary: Mild diffuse low-attenuation of the liver without focal mass. Gallbladder and biliary tree are normal. Pancreas: Normal. Spleen: Previous splenectomy. Adrenals/Urinary Tract: Adrenal glands are normal. Kidneys are normal in size without hydronephrosis or nephrolithiasis. Ureters and bladder are  normal. Stomach/Bowel: Stomach and small bowel are normal. Appendix is normal. Colon is normal. Vascular/Lymphatic: Normal. Reproductive: Normal. Other: No free fluid or focal inflammatory change. Musculoskeletal: Unchanged. IMPRESSION: No acute findings in the abdomen/pelvis. Previous splenectomy. Electronically Signed   By: Elberta Fortisaniel  Boyle M.D.   On: 03/13/2018 18:01   Dg Chest Port 1 View  Result Date: 03/13/2018 CLINICAL DATA:  Shortness of breath with dizziness since yesterday. Petechiae over chest. Previous splenectomy. EXAM: PORTABLE CHEST 1 VIEW COMPARISON:  09/18/2015 and 09/16/2015 FINDINGS: Lungs are hypoinflated without focal airspace consolidation or effusion. Cardiomediastinal silhouette is within normal. There prominence of the overlying soft tissues. Bony structures are normal. IMPRESSION: No acute cardiopulmonary disease. Electronically Signed   By: Elberta Fortis M.D.   On: 03/13/2018 15:50    ASSESSMENT & PLAN: 42 year old female, with previously refractory ITP, status post multiple lines treatment and splenectomy, resolved eventually.  She now presented with recurrent severe thrombocytopenia.  1.  Recurrent refractory ITP 2. Small foci of subarachnoid hemorrhage  Recommendations: -Lab reviewed, she has isolated severe thrombocytopenia, normal WBC and hemoglobin, normal PT, APTT, no signs of infection, this is consistent with recurrent ITP  -will continue dexa 40mg  daily for a total of 4 doses, I ordered -will give second dose IVIG based on her actual weight, around 110g today (she received 45g based on ideal weight) -will start Nplate today, although we typically start at 1 typically start at 1 mg/kg, due to her poor response to treatment 2 years ago, I will start her on 2 mg/kg today, continue weekly, and will increase dose on week 2 if no good response  -I will obtain a CBC post platelet transfusion, I have ordered a stat CBC and informed her nurse to draw ASAP  -please monitor  CBC daily  -consider repeat CT head wo contrast in 1-2 days to monitor [facility]  -Patient's family has concerns that she did not respond to treatment well last time when she was here and required transferring to [facility] last time.  I spoke with Dr. Gaynell Face at [facility] last night, I recommend her to continue current treatments here at [facility], but we will facilitate her transfer if needed or if pt/family requests. Both pt and her daughter agree with the plan.  -I will f/u closely.    All questions were answered. The patient knows to call the clinic with any problems, questions or concerns.      Malachy Mood, MD 03/14/2018 7:58 AM

## 2018-03-14 NOTE — Progress Notes (Signed)
BP 94/48 after increasing IVIG to 66, MD Feng notified. Will give 500 ml bolus NS, recheck BP, and restart IVIG per MD. Called IV team, given instructions to restart IVIG at starting rate (33 ml/hr). Will continue to monitor.

## 2018-03-14 NOTE — Social Work (Signed)
CSW acknowledging consult, needing clarity regarding "barriers to specialist care." For f/u with specialist services and PCP please let MD/RNCM know. Able to support pt with information regarding transportation and crisis services if needed.  Octavio GravesIsabel Daud Cayer, MSW, [facility] [place] Clinical Social Work [phone]

## 2018-03-15 DIAGNOSIS — E876 Hypokalemia: Secondary | ICD-10-CM

## 2018-03-15 DIAGNOSIS — D72829 Elevated white blood cell count, unspecified: Secondary | ICD-10-CM

## 2018-03-15 LAB — PREPARE PLATELET PHERESIS
Unit division: 0
Unit division: 0
Unit division: 0
Unit division: 0

## 2018-03-15 LAB — BPAM PLATELET PHERESIS
BLOOD PRODUCT EXPIRATION DATE: 201912262359
Blood Product Expiration Date: 201912262359
Blood Product Expiration Date: 201912272359
Blood Product Expiration Date: 201912272359
ISSUE DATE / TIME: 201912260429
ISSUE DATE / TIME: 201912261304
ISSUE DATE / TIME: 201912261818
ISSUE DATE / TIME: 201912261935
Unit Type and Rh: 5100
Unit Type and Rh: 5100
Unit Type and Rh: 6200
Unit Type and Rh: 6200

## 2018-03-15 LAB — CBC WITH DIFFERENTIAL/PLATELET
Abs Immature Granulocytes: 0.32 10*3/uL — ABNORMAL HIGH (ref 0.00–0.07)
Basophils Absolute: 0 10*3/uL (ref 0.0–0.1)
Basophils Relative: 0 %
Eosinophils Absolute: 0 10*3/uL (ref 0.0–0.5)
Eosinophils Relative: 0 %
HCT: 32.6 % — ABNORMAL LOW (ref 36.0–46.0)
Hemoglobin: 10.6 g/dL — ABNORMAL LOW (ref 12.0–15.0)
Immature Granulocytes: 1 %
Lymphocytes Relative: 6 %
Lymphs Abs: 1.5 10*3/uL (ref 0.7–4.0)
MCH: 28.6 pg (ref 26.0–34.0)
MCHC: 32.5 g/dL (ref 30.0–36.0)
MCV: 87.9 fL (ref 80.0–100.0)
Monocytes Absolute: 0.6 10*3/uL (ref 0.1–1.0)
Monocytes Relative: 3 %
Neutro Abs: 21.6 10*3/uL — ABNORMAL HIGH (ref 1.7–7.7)
Neutrophils Relative %: 90 %
Platelets: 123 10*3/uL — ABNORMAL LOW (ref 150–400)
RBC: 3.71 MIL/uL — ABNORMAL LOW (ref 3.87–5.11)
RDW: 14.6 % (ref 11.5–15.5)
WBC: 24 10*3/uL — ABNORMAL HIGH (ref 4.0–10.5)
nRBC: 0.2 % (ref 0.0–0.2)

## 2018-03-15 NOTE — Progress Notes (Signed)
[name]   DOB:12/08/1975   OZ#:[id number]   NGE#:[id number]#:[id number]  Hematology follow up   Subjective: Pt is doing well today, denies any bleeding signs. She had mild dyspnea and she ambulated today, no chest pain, was a new complaints.  She had episode of sweating and diaphoresis after taking dexamethasone yesterday, blood pressure slowly dropped, resolved after IV fluids.  She tolerated IVIG yesterday well.   Objective:  Vitals:   03/15/18 1644 03/15/18 1947  BP:  (!) 97/50  Pulse:  78  Resp:  18  Temp: 98.8 F (37.1 C) 98.6 F (37 C)  SpO2:  98%    Body mass index is 47.58 kg/m.  Intake/Output Summary (Last 24 hours) at 03/15/2018 1959 Last data filed at 03/15/2018 1217 Gross per 24 hour  Intake -  Output 400 ml  Net -400 ml     Sclerae unicteric  Oropharynx clear  No peripheral adenopathy  Lungs clear -- no rales or rhonchi  Heart regular rate and rhythm  Abdomen benign  MSK no focal spinal tenderness, no peripheral edema  Neuro nonfocal  Skin: A few small ecchymosis on her legs  CBG (last 3)  No results for input(s): GLUCAP in the last 72 hours.   Labs:  Lab Results  Component Value Date   WBC 24.0 (H) 03/15/2018   HGB 10.6 (L) 03/15/2018   HCT 32.6 (L) 03/15/2018   MCV 87.9 03/15/2018   PLT 123 (L) 03/15/2018   NEUTROABS 21.6 (H) 03/15/2018    Urine Studies No results for input(s): UHGB, CRYS in the last 72 hours.  Invalid input(s): UACOL, UAPR, USPG, UPH, UTP, UGL, UKET, UBIL, UNIT, UROB, ULEU, UEPI, UWBC, URBC, UBAC, CAST, UCOM, BILUA  Basic Metabolic Panel: Recent Labs  Lab 03/13/18 1635 03/14/18 0439  NA 139 134*  K 3.4* 4.1  CL 105 104  CO2 25 18*  GLUCOSE 96 186*  BUN 10 10  CREATININE 0.57 0.67  CALCIUM 9.2 8.7*   GFR Estimated Creatinine Clearance: 102.3 mL/min (by C-G formula based on SCr of 0.67 mg/dL). Liver Function Tests: Recent Labs  Lab 03/13/18 1635  AST 18  ALT 20  ALKPHOS 60  BILITOT 0.4  PROT 7.1   ALBUMIN 3.8   Recent Labs  Lab 03/13/18 1635  LIPASE 43   No results for input(s): AMMONIA in the last 168 hours. Coagulation profile Recent Labs  Lab 03/13/18 1635  INR 1.05    CBC: Recent Labs  Lab 03/13/18 1635 03/14/18 0439 03/14/18 0838 03/15/18 0433  WBC 14.8* 15.6* 20.3* 24.0*  NEUTROABS  --  13.6*  --  21.6*  HGB 11.8* 12.3 11.9* 10.6*  HCT 37.4 37.8 35.7* 32.6*  MCV 88.6 87.5 86.9 87.9  PLT <5* 7* 65* 123*   Cardiac Enzymes: No results for input(s): CKTOTAL, CKMB, CKMBINDEX, TROPONINI in the last 168 hours. BNP: Invalid input(s): POCBNP CBG: No results for input(s): GLUCAP in the last 168 hours. D-Dimer No results for input(s): DDIMER in the last 72 hours. Hgb A1c No results for input(s): HGBA1C in the last 72 hours. Lipid Profile No results for input(s): CHOL, HDL, LDLCALC, TRIG, CHOLHDL, LDLDIRECT in the last 72 hours. Thyroid function studies No results for input(s): TSH, T4TOTAL, T3FREE, THYROIDAB in the last 72 hours.  Invalid input(s): FREET3 Anemia work up No results for input(s): VITAMINB12, FOLATE, FERRITIN, TIBC, IRON, RETICCTPCT in the last 72 hours. Microbiology No results found for this or any previous visit (from the past 240 hour(s)).  Studies:  No results found.  Assessment: 42 y.o. female, with previously refractory ITP, status post multiple lines treatment and splenectomy, resolved eventually.  She now presented with recurrent severe thrombocytopenia.  1.  Recurrent refractory ITP 2. Small foci of subarachnoid hemorrhage 3. Anemia   Recommendations: -she has responded very well to ITP treatment and platelet transfusion, platelet count went up to 123K this morning. -continue dexa 40mg  daily for a total of 4 days, last dose tomorrow morning, no more IVIG needed  -continue Nplate injection weekly, next dose 1/2  -anemia mild, possible dilutional, OK to monitor for now.  -if her plt remains to be stable or improved  tomorrow, OK to discharge  home, and I will set up lab and f/u with injection in my clinic next week    Malachy MoodYan Quanda Pavlicek, MD 03/15/2018

## 2018-03-15 NOTE — Progress Notes (Signed)
Physical assessment and medications administered with the assistance of translator Melanee Spryan [phone].

## 2018-03-15 NOTE — Progress Notes (Signed)
PROGRESS NOTE    [name]  ZOX:[id number] DOB: [birth year] DOA: 03/13/2018 PCP: Gayla Doss., MD   Brief Narrative:  HPI on 03/13/2018 by Dr. Koren Bound [name] is a 42 y.o. female with hx of chronic ITP s/p splenectomy with acute ITP flare in 2017 who presents to [facility] ED with subacute leg rash and more acute headache and gum bleeding. Patient reports that about 3 weeks ago, noted small red dots on both lower extremities and then progressive development of spontaneous bruising (without any trauma) over her upper thighs. In the past few days, she has noticed gum bleeding and worsening frontal headaches. Only mildly alleviated with tylenol. She also has noticed blurry vision at the left far side of her peripheral vision x 2 days. No menses (unclear when last menses) and no vaginal bleeding. Her hospital course in 2017 was complicated by refractory thrombocytopenia despite high dose steroids and IVIG and eventual transfer to [facility] (see below Assessment/Plan). She has since been on weekly N-plate therapy, which was weaned off sometime in late 2017 and has since had no bleeding issues until recently. She also reports chronic left sided abdominal pain (aching/cramping discomfort), positional in nature, since her splenectomy which has not changed significantly. Of note, history was obtained from daughter who requested to translate for the patient and from the chart.   Interim history Admitted with acute thrombocytopenia and ITP flare along with subarachnoid hemorrhage.  Hematology oncology consulted and following.  Assessment & Plan   Acute thrombocytopenia secondary to ITP flare -Patient was noted to have similar presentation back in 2017 and was following with hematology -Upon presentation, her initial platelet count was less than 5 -Hematology oncology consulted and appreciated.  Discussed with Dr. Mosetta Putt, recommended starting IVIG as well as platelet transfusion and  IV Decadron. -Platelets currently 123 -Continue to monitor CBC -Discussed with Dr. Mosetta Putt, platelet counts are stable tomorrow, patient may be discharged.  Patient should continue dexamethasone through 03/16/2018.  No further IVIG at this time.  Patient will need to follow-up with Dr. Cyndie Chime.  Small subarachnoid hemorrhage -Suspect spontaneous in the setting of acute ITP -CT head: Subtle serpiginous  -Neurosurgery was consulted by EDP -Continue neuro checks -Continue Decadron, IVIG -Will likely need repeat CT scan in a couple of days -Currently no neurological deficits  Leukocytosis -Likely secondary to Decadron  Hypokalemia -Resolved with repletion, continue to monitor BMP  Chronic LUQ pain -Has been occurring since splenectomy, suspect musculoskeletal in nature -CT abdomen pelvis unremarkable -Continue pain control  DVT Prophylaxis  SCDs  Code Status: Full  Family Communication: Friends at bedside  Disposition Plan: Admitted. Continue to treat ITP- pending further hematology recommendations. Suspect home when medically stable, possibly on 03/16/2018 if platelet count is stable.  Consultants Hematology/oncology Neurosurgery  Procedures  None  Antibiotics   Anti-infectives (From admission, onward)   None      Subjective:   [name] seen and examined today with the use of a Nurse, learning disability.  She has no complaints today.  She is happy to hear that her platelet count is improving.  She wonders if she will have to have medications today.  Denies current chest pain, shortness of breath, abdominal pain, nausea or vomiting, diarrhea or constipation, headache.   Objective:   Vitals:   03/14/18 2333 03/15/18 0359 03/15/18 0800 03/15/18 1217  BP: (!) 90/52 (!) 91/53 113/62   Pulse:      Resp:      Temp: 98 F (36.7 C) 98.7 F (  37.1 C) 98.1 F (36.7 C) 98.2 F (36.8 C)  TempSrc: Oral Axillary Oral   SpO2:      Weight:      Height:         Intake/Output Summary (Last 24 hours) at 03/15/2018 1406 Last data filed at 03/15/2018 1217 Gross per 24 hour  Intake -  Output 400 ml  Net -400 ml   Filed Weights   03/13/18 1535 03/13/18 2103  Weight: 112 kg 110.5 kg   Exam  General: Well developed, well nourished, NAD, appears stated age  HEENT: NCAT, mucous membranes moist.   Neck: Supple  Cardiovascular: S1 S2 auscultated, RRR, no murmur  Respiratory: Clear to auscultation bilaterally with equal chest rise  Abdomen: Soft, obese, nontender, nondistended, + bowel sounds  Extremities: warm dry without cyanosis clubbing or edema  Neuro: AAOx3, nonfocal  Psych: Normal affect and demeanor, pleasant   Data Reviewed: I have personally reviewed following labs and imaging studies  CBC: Recent Labs  Lab 03/13/18 1635 03/14/18 0439 03/14/18 0838 03/15/18 0433  WBC 14.8* 15.6* 20.3* 24.0*  NEUTROABS  --  13.6*  --  21.6*  HGB 11.8* 12.3 11.9* 10.6*  HCT 37.4 37.8 35.7* 32.6*  MCV 88.6 87.5 86.9 87.9  PLT <5* 7* 65* 123*   Basic Metabolic Panel: Recent Labs  Lab 03/13/18 1635 03/14/18 0439  NA 139 134*  K 3.4* 4.1  CL 105 104  CO2 25 18*  GLUCOSE 96 186*  BUN 10 10  CREATININE 0.57 0.67  CALCIUM 9.2 8.7*   GFR: Estimated Creatinine Clearance: 102.3 mL/min (by C-G formula based on SCr of 0.67 mg/dL). Liver Function Tests: Recent Labs  Lab 03/13/18 1635  AST 18  ALT 20  ALKPHOS 60  BILITOT 0.4  PROT 7.1  ALBUMIN 3.8   Recent Labs  Lab 03/13/18 1635  LIPASE 43   No results for input(s): AMMONIA in the last 168 hours. Coagulation Profile: Recent Labs  Lab 03/13/18 1635  INR 1.05   Cardiac Enzymes: No results for input(s): CKTOTAL, CKMB, CKMBINDEX, TROPONINI in the last 168 hours. BNP (last 3 results) No results for input(s): PROBNP in the last 8760 hours. HbA1C: No results for input(s): HGBA1C in the last 72 hours. CBG: No results for input(s): GLUCAP in the last 168 hours. Lipid  Profile: No results for input(s): CHOL, HDL, LDLCALC, TRIG, CHOLHDL, LDLDIRECT in the last 72 hours. Thyroid Function Tests: No results for input(s): TSH, T4TOTAL, FREET4, T3FREE, THYROIDAB in the last 72 hours. Anemia Panel: No results for input(s): VITAMINB12, FOLATE, FERRITIN, TIBC, IRON, RETICCTPCT in the last 72 hours. Urine analysis:    Component Value Date/Time   COLORURINE YELLOW 02/04/2018 2350   APPEARANCEUR HAZY (A) 02/04/2018 2350   LABSPEC 1.024 02/04/2018 2350   PHURINE 6.0 02/04/2018 2350   GLUCOSEU NEGATIVE 02/04/2018 2350   HGBUR MODERATE (A) 02/04/2018 2350   BILIRUBINUR NEGATIVE 02/04/2018 2350   BILIRUBINUR neg 10/12/2015 0852   KETONESUR NEGATIVE 02/04/2018 2350   PROTEINUR NEGATIVE 02/04/2018 2350   UROBILINOGEN 0.2 10/12/2015 0852   UROBILINOGEN 0.2 10/02/2011 1133   NITRITE NEGATIVE 02/04/2018 2350   LEUKOCYTESUR LARGE (A) 02/04/2018 2350   Sepsis Labs: @LABRCNTIP (procalcitonin:4,lacticidven:4)  )No results found for this or any previous visit (from the past 240 hour(s)).    Radiology Studies: Ct Head Wo Contrast  Result Date: 03/13/2018 CLINICAL DATA:  Bleeding from the gums with bruising of the legs, dyspnea, dizziness and petechiae over the chest. Bruising began 3 weeks bleeding  of the gums with dyspnea and petechiae started yesterday. EXAM: CT HEAD WITHOUT CONTRAST TECHNIQUE: Contiguous axial images were obtained from the base of the skull through the vertex without intravenous contrast. COMPARISON:  09/20/2015 head CT and MRI from 09/21/2015 FINDINGS: Brain: Subtle serpiginous hyperdensities in the posterior parietal lobes bilaterally, right greater than left raise concern for small foci subarachnoid hemorrhage. No extra-axial fluid collections are noted. No midline shift, large vascular territory infarct, hydrocephalus no effacement of the basal cisterns. No intra-axial mass. Vascular: No hyperdense vessel or unexpected calcification. Skull: Normal.  Negative for fracture or focal lesion. Sinuses/Orbits: No acute finding. Other: None. IMPRESSION: Subtle serpiginous hyperdensities in the posterior parietal lobes bilaterally, right greater than left consistent with small foci of subarachnoid hemorrhage. These results were called by telephone at the time of interpretation on 03/13/2018 at 6:02 pm to Dr. Kennis CarinaMICHAEL BERO , who verbally acknowledged these results. Electronically Signed   By: Tollie Ethavid  Kwon M.D.   On: 03/13/2018 18:02   Ct Abdomen Pelvis W Contrast  Result Date: 03/13/2018 CLINICAL DATA:  Shortness of breath and dizziness with particular over the chest region. Previous splenectomy. EXAM: CT ABDOMEN AND PELVIS WITH CONTRAST TECHNIQUE: Multidetector CT imaging of the abdomen and pelvis was performed using the standard protocol following bolus administration of intravenous contrast. CONTRAST:  100mL OMNIPAQUE IOHEXOL 300 MG/ML  SOLN COMPARISON:  02/05/2018 FINDINGS: Lower chest: Lung bases are normal. Hepatobiliary: Mild diffuse low-attenuation of the liver without focal mass. Gallbladder and biliary tree are normal. Pancreas: Normal. Spleen: Previous splenectomy. Adrenals/Urinary Tract: Adrenal glands are normal. Kidneys are normal in size without hydronephrosis or nephrolithiasis. Ureters and bladder are normal. Stomach/Bowel: Stomach and small bowel are normal. Appendix is normal. Colon is normal. Vascular/Lymphatic: Normal. Reproductive: Normal. Other: No free fluid or focal inflammatory change. Musculoskeletal: Unchanged. IMPRESSION: No acute findings in the abdomen/pelvis. Previous splenectomy. Electronically Signed   By: Elberta Fortisaniel  Boyle M.D.   On: 03/13/2018 18:01   Dg Chest Port 1 View  Result Date: 03/13/2018 CLINICAL DATA:  Shortness of breath with dizziness since yesterday. Petechiae over chest. Previous splenectomy. EXAM: PORTABLE CHEST 1 VIEW COMPARISON:  09/18/2015 and 09/16/2015 FINDINGS: Lungs are hypoinflated without focal airspace  consolidation or effusion. Cardiomediastinal silhouette is within normal. There prominence of the overlying soft tissues. Bony structures are normal. IMPRESSION: No acute cardiopulmonary disease. Electronically Signed   By: Elberta Fortisaniel  Boyle M.D.   On: 03/13/2018 15:50     Scheduled Meds: . dexamethasone  39 mg Oral Daily  . Influenza vac split quadrivalent PF  0.5 mL Intramuscular Tomorrow-1000  . romiPLOStim  2 mcg/kg Subcutaneous Weekly   Continuous Infusions:   LOS: 2 days   Time Spent in minutes   30 minutes  Hinley Brimage D.O. on 03/15/2018 at 2:06 PM  Between 7am to 7pm - Please see pager noted on [email or web address]  After 7pm go to [email or web address]  And look for the night coverage person covering for me after hours  [facility] Office  [phone]

## 2018-03-16 ENCOUNTER — Inpatient Hospital Stay (HOSPITAL_COMMUNITY): Payer: Medicaid Other

## 2018-03-16 DIAGNOSIS — R0600 Dyspnea, unspecified: Secondary | ICD-10-CM

## 2018-03-16 LAB — CBC WITH DIFFERENTIAL/PLATELET
Abs Immature Granulocytes: 0.58 10*3/uL — ABNORMAL HIGH (ref 0.00–0.07)
BASOS PCT: 0 %
Basophils Absolute: 0 10*3/uL (ref 0.0–0.1)
Eosinophils Absolute: 0 10*3/uL (ref 0.0–0.5)
Eosinophils Relative: 0 %
HCT: 33.3 % — ABNORMAL LOW (ref 36.0–46.0)
Hemoglobin: 10.7 g/dL — ABNORMAL LOW (ref 12.0–15.0)
Immature Granulocytes: 3 %
Lymphocytes Relative: 10 %
Lymphs Abs: 2 10*3/uL (ref 0.7–4.0)
MCH: 28.7 pg (ref 26.0–34.0)
MCHC: 32.1 g/dL (ref 30.0–36.0)
MCV: 89.3 fL (ref 80.0–100.0)
Monocytes Absolute: 0.9 10*3/uL (ref 0.1–1.0)
Monocytes Relative: 4 %
NRBC: 0.3 % — AB (ref 0.0–0.2)
Neutro Abs: 17.6 10*3/uL — ABNORMAL HIGH (ref 1.7–7.7)
Neutrophils Relative %: 83 %
Platelets: 174 10*3/uL (ref 150–400)
RBC: 3.73 MIL/uL — AB (ref 3.87–5.11)
RDW: 15 % (ref 11.5–15.5)
WBC: 21.1 10*3/uL — AB (ref 4.0–10.5)

## 2018-03-16 NOTE — Discharge Summary (Signed)
Physician Discharge Summary  [name] RUE:[id number] DOB: 08/13/1975 DOA: 03/13/2018  PCP: [name]., MD  Admit date: 03/13/2018 Discharge date: 03/16/2018  Time spent: 45 minutes  Recommendations for Outpatient Follow-up:  Patient will be discharged to home.  Patient will need to follow up with primary care provider within one week of discharge.  Patient will need to follow-up with [name] in one week for repeat labs and follow up injection. Patient should continue medications as prescribed.  Patient should follow a regular diet.   Discharge Diagnoses:  Acute thrombocytopenia secondary to ITP flare Small subarachnoid hemorrhage Leukocytosis Hypokalemia Chronic LUQ pain  Discharge Condition: Stable  Diet recommendation: regular  Filed Weights   03/13/18 1535 03/13/18 2103  Weight: 112 kg 110.5 kg    History of present illness:  on 03/13/2018 by Dr. 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 Patient reports that about 3 weeks ago, noted small red dots on both lower extremities and then progressive development of spontaneous bruising (without any trauma) over her upper thighs. In the past few days, she has noticed gum bleeding and worsening frontal headaches. Only mildly alleviated with tylenol. She also has noticed blurry vision at the left far side of her peripheral vision x 2 days. No menses (unclear when last menses) and no vaginal bleeding. Her hospital course in 2017 was complicated by refractory thrombocytopenia despite high dose steroids and IVIG and eventual transfer to [name] (see below Assessment/Plan). She has since been on weekly N-plate therapy, which was weaned off sometime in late 2017 and has since had no bleeding issues until recently. She also reports chronic left sided abdominal  pain (aching/cramping discomfort), positional in nature, since her splenectomy which has not changed significantly.Of note, history was obtained from daughter who requested to translate for the patient and from the chart.  Hospital Course:  Acute thrombocytopenia secondary to ITP flare -Patient was noted to have similar presentation back in 2017 and was following with hematology -Upon presentation, her initial platelet count was less than 5 -Hematology oncology consulted and appreciated.  Discussed with [name], recommended starting IVIG as well as platelet transfusion and IV Decadron, Nplate. -Platelets currently 123 -Continue to monitor CBC -Discussed with [name], platelet counts are stable tomorrow, patient may be discharged.  Patient should continue dexamethasone through 03/16/2018.  No further IVIG at this time.  Patient will need to follow-up with Dr. Cyndie Holland. -Per hematology oncology, patient will follow up with Dr. Latanya Holland clinic in 1 week for repeat labs as well as N-plate injection.  Small subarachnoid hemorrhage -Suspect spontaneous in the setting of acute ITP -CT head: Subtle serpiginous hyperdensities in the posterior parietal lobes bilaterally, greater than left consistent with small foci of subarachnoid hemorrhage. -Neurosurgery was consulted by EDP -Neuro checks unremarkable  -Was placed on Decadron, IVIG -Will likely need repeat CT scan in a couple of days -Currently no neurological deficits -Repeat CT head today showed sulcal high density at the frontal parietal convexities very similar to 02/2016 comparison, favor mineralization over acute subarachnoid hemorrhage.  Leukocytosis -Likely secondary to Decadron  Hypokalemia -Resolved with repletion, continue to monitor BMP  Chronic LUQ pain -Has been occurring since splenectomy, suspect musculoskeletal in nature -CT abdomen pelvis unremarkable -Continue pain control  Subjective dyspnea -Patient feels that  she is short winded upon first getting up and  walking around however it improves. -Currently clear breath sounds on exam -X-ray on admission was unremarkable for infection -Repeat chest x-ray today unremarkable for acute abnormality or infection  Consultants Hematology/oncology Neurosurgery  Procedures  None   Discharge Exam: Vitals:   03/16/18 0430 03/16/18 0755  BP: 110/70 (!) 100/57  Pulse: 61   Resp:    Temp:  98.9 F (37.2 C)  SpO2: 99%      General: Well developed, well nourished, NAD, appears stated age  HEENT: NCAT, mucous membranes moist.  Neck: Supple  Cardiovascular: S1 S2 auscultated, no rubs, murmurs or gallops. Regular rate and rhythm.  Respiratory: Clear to auscultation bilaterally with equal chest rise  Abdomen: Soft, obese, nontender, nondistended, + bowel sounds  Extremities: warm dry without cyanosis clubbing or edema  Neuro: AAOx3, nonfocal   Psych: Pleasant, appropriate mood and affect  Discharge Instructions Discharge Instructions    Discharge instructions   Complete by:  As directed    Patient will be discharged to home.  Patient will need to follow up with primary care provider within one week of discharge.  Patient will need to follow-up with [name] in one week for repeat labs and follow up injection. Patient should continue medications as prescribed.  Patient should follow a regular diet.     Allergies as of 03/16/2018   No Known Allergies     Medication List    TAKE these medications   acetaminophen 500 MG tablet Commonly known as:  TYLENOL Take 1-2 tablets (500-1,000 mg total) by mouth every 6 (six) hours as needed for mild pain or moderate pain. What changed:    how much to take  when to take this      No Known Allergies Follow-up Information    [name]., MD. Schedule an appointment as soon as possible for a visit in 1 week(s).   Specialty:  General Practice Why:  Hospital follow up Contact  information: [address] [place] [place] [address] [phone]        Malachy Mood, MD. Schedule an appointment as soon as possible for a visit in 1 week(s).   Specialties:  Hematology, Oncology Why:  Hospital follow up Contact information: [address] [place] [address] [phone]            The results of significant diagnostics from this hospitalization (including imaging, microbiology, ancillary and laboratory) are listed below for reference.    Significant Diagnostic Studies: Ct Head Wo Contrast  Result Date: 03/16/2018 CLINICAL DATA:  Follow-up subarachnoid hemorrhage EXAM: CT HEAD WITHOUT CONTRAST TECHNIQUE: Contiguous axial images were obtained from the base of the skull through the vertex without intravenous contrast. COMPARISON:  Three days ago FINDINGS: Brain: Trace high-density in the bilateral frontal parietal convexities very similar to 09/20/2015 study and likely mineralization within the subarachnoid spaces. No edema, infarct, hydrocephalus, or masslike finding. Due to streak artifact, limited assessment of the left temporal lobe where there was unusual signal abnormality on 2017 brain MRI. Vascular: Negative Skull: Negative Sinuses/Orbits: Negative IMPRESSION: Sulcal high-density at the frontal parietal convexities very similar to 2017 comparison, favor mineralization over acute subarachnoid hemorrhage. Electronically Signed   By: Marnee Spring M.D.   On: 03/16/2018 09:26   Ct Head Wo Contrast  Result Date: 03/13/2018 CLINICAL DATA:  Bleeding from the gums with bruising of the legs, dyspnea, dizziness and petechiae over the chest. Bruising began 3 weeks bleeding of the gums with dyspnea and petechiae started yesterday. EXAM: CT HEAD  WITHOUT CONTRAST TECHNIQUE: Contiguous axial images were obtained from the base of the skull through the vertex without intravenous contrast. COMPARISON:  09/20/2015 head CT and MRI from 09/21/2015 FINDINGS: Brain: Subtle  serpiginous hyperdensities in the posterior parietal lobes bilaterally, right greater than left raise concern for small foci subarachnoid hemorrhage. No extra-axial fluid collections are noted. No midline shift, large vascular territory infarct, hydrocephalus no effacement of the basal cisterns. No intra-axial mass. Vascular: No hyperdense vessel or unexpected calcification. Skull: Normal. Negative for fracture or focal lesion. Sinuses/Orbits: No acute finding. Other: None. IMPRESSION: Subtle serpiginous hyperdensities in the posterior parietal lobes bilaterally, right greater than left consistent with small foci of subarachnoid hemorrhage. These results were called by telephone at the time of interpretation on 03/13/2018 at 6:02 pm to Dr. Kennis CarinaMICHAEL BERO , who verbally acknowledged these results. Electronically Signed   By: Tollie Ethavid  Kwon M.D.   On: 03/13/2018 18:02   Ct Abdomen Pelvis W Contrast  Result Date: 03/13/2018 CLINICAL DATA:  Shortness of breath and dizziness with particular over the chest region. Previous splenectomy. EXAM: CT ABDOMEN AND PELVIS WITH CONTRAST TECHNIQUE: Multidetector CT imaging of the abdomen and pelvis was performed using the standard protocol following bolus administration of intravenous contrast. CONTRAST:  100mL OMNIPAQUE IOHEXOL 300 MG/ML  SOLN COMPARISON:  02/05/2018 FINDINGS: Lower chest: Lung bases are normal. Hepatobiliary: Mild diffuse low-attenuation of the liver without focal mass. Gallbladder and biliary tree are normal. Pancreas: Normal. Spleen: Previous splenectomy. Adrenals/Urinary Tract: Adrenal glands are normal. Kidneys are normal in size without hydronephrosis or nephrolithiasis. Ureters and bladder are normal. Stomach/Bowel: Stomach and small bowel are normal. Appendix is normal. Colon is normal. Vascular/Lymphatic: Normal. Reproductive: Normal. Other: No free fluid or focal inflammatory change. Musculoskeletal: Unchanged. IMPRESSION: No acute findings in the  abdomen/pelvis. Previous splenectomy. Electronically Signed   By: Elberta Fortisaniel  Boyle M.D.   On: 03/13/2018 18:01   Dg Chest Port 1 View  Result Date: 03/13/2018 CLINICAL DATA:  Shortness of breath with dizziness since yesterday. Petechiae over chest. Previous splenectomy. EXAM: PORTABLE CHEST 1 VIEW COMPARISON:  09/18/2015 and 09/16/2015 FINDINGS: Lungs are hypoinflated without focal airspace consolidation or effusion. Cardiomediastinal silhouette is within normal. There prominence of the overlying soft tissues. Bony structures are normal. IMPRESSION: No acute cardiopulmonary disease. Electronically Signed   By: Elberta Fortisaniel  Boyle M.D.   On: 03/13/2018 15:50    Microbiology: No results found for this or any previous visit (from the past 240 hour(s)).   Labs: Basic Metabolic Panel: Recent Labs  Lab 03/13/18 1635 03/14/18 0439  NA 139 134*  K 3.4* 4.1  CL 105 104  CO2 25 18*  GLUCOSE 96 186*  BUN 10 10  CREATININE 0.57 0.67  CALCIUM 9.2 8.7*   Liver Function Tests: Recent Labs  Lab 03/13/18 1635  AST 18  ALT 20  ALKPHOS 60  BILITOT 0.4  PROT 7.1  ALBUMIN 3.8   Recent Labs  Lab 03/13/18 1635  LIPASE 43   No results for input(s): AMMONIA in the last 168 hours. CBC: Recent Labs  Lab 03/13/18 1635 03/14/18 0439 03/14/18 0838 03/15/18 0433 03/16/18 0510  WBC 14.8* 15.6* 20.3* 24.0* 21.1*  NEUTROABS  --  13.6*  --  21.6* 17.6*  HGB 11.8* 12.3 11.9* 10.6* 10.7*  HCT 37.4 37.8 35.7* 32.6* 33.3*  MCV 88.6 87.5 86.9 87.9 89.3  PLT <5* 7* 65* 123* 174   Cardiac Enzymes: No results for input(s): CKTOTAL, CKMB, CKMBINDEX, TROPONINI in the last 168 hours. BNP: BNP (  last 3 results) No results for input(s): BNP in the last 8760 hours.  ProBNP (last 3 results) No results for input(s): PROBNP in the last 8760 hours.  CBG: No results for input(s): GLUCAP in the last 168 hours.     Signed:  Edsel PetrinMaryann Lanny Donoso  [facility] 03/16/2018, 10:39 AM

## 2018-03-16 NOTE — Progress Notes (Signed)
Patient education provided to patient and family. AVS discharge summary printed in english and spanish and given to patient. Patient verbalizes understanding of education and follow up instructions and all questions were addressed with RN. IV and tele discontinued without complication. Patient discharged from facility via wheelchair.

## 2018-03-16 NOTE — Discharge Instructions (Signed)
Prpura trombocitopnica idioptica Idiopathic Thrombocytopenic Purpura La prpura trombocitopnica idioptica (PTI) es una enfermedad en la que el sistema de defensa del cuerpo (sistema inmunitario) ataca las plaquetas en el cuerpo. Las plaquetas son clulas sanguneas que se agrupan para formar cogulos. Los cogulos de Nigeriasangre ayudan a Associate Professordetener hemorragias en el cuerpo. Una persona con PTI tiene muy pocas plaquetas. En consecuencia, es ms difcil que la sangre coagule. Neomia DearUna persona puede formar moretones o Geophysicist/field seismologistsangrar con facilidad, Scientist, clinical (histocompatibility and immunogenetics)como sangrar mucho por cortes o raspones pequeos. La PTI puede afectar tanto a nios como adultos. Por lo general, es una afeccin a corto plazo (aguda) en los nios y Neomia Dearuna afeccin a largo plazo (crnica) en los adultos. Cules son las causas? Se desconoce la causa de la PTI. En algunos casos, esta afeccin puede presentarse:  Despus de una infeccin viral.  Durante el embarazo.  Despus de desarrollar un trastorno del sistema inmunitario. Qu incrementa el riesgo? Tiene mayor probabilidad de sufrir esta afeccin si:  Es Nurse, learning disabilitymujer.  Tiene entre [id number] de edad. Cules son los signos o los sntomas? Si su caso es leve, es posible que no tenga sntomas. En los casos ms graves, los sntomas pueden incluir lo siguiente:  Aparicin de moretones con facilidad.  Lesiones menores, como cortes y Lone Oakraspaduras, que sangran durante Sartellmucho tiempo.  Puntos pequeos de color morado o rojo debajo de la piel (petequias), en especial en las espinillas.  Sangre en la orina o en la materia fecal (heces).  Hemorragias nasales.  Encas sangrantes.  Perodos menstruales muy abundantes en las mujeres. Cmo se diagnostica? Esta afeccin se puede diagnosticar en funcin de lo siguiente:  Los sntomas y antecedentes mdicos.  Un examen fsico.  Anlisis de sangre.  Pruebas del tejido esponjoso que est dentro de los huesos (mdula sea). Cmo se trata? El tratamiento  depende de la gravedad de su afeccin. El tratamiento puede incluir lo siguiente:  El control de los sntomas y el recuento de plaquetas durante un Lorainetiempo. Es posible que Programmer, multimediadeba consultar al mdico y International aid/development workerrealizarse anlisis de sangre regularmente.  Recibir hemoderivados de un donante (transfusiones), como las plaquetas.  Medicamentos para: ? Social research officer, governmenteducir la inflamacin (corticoesteroides). ? Aumentar la cantidad de plaquetas que produce el cuerpo. ? Reducir la actividad del sistema inmunitario.  Ciruga para extirpar el bazo, si otros tratamientos no son eficaces. El bazo es un rgano situado en la parte superior del lado izquierdo del abdomen. Este almacena clulas sanguneas e interviene en algunas de las funciones del sistema inmunitario. En la PTI, el bazo libera protenas (anticuerpos) que atacan por error a las plaquetas. Siga estas indicaciones en su casa: Medicamentos   Baxter Internationalome los medicamentos de venta libre y los recetados solamente como se lo haya indicado el mdico. No tome los siguientes medicamentos a menos que el mdico lo autorice: ? Media plannerMedicamentos de venta libre que contengan aspirina. ? Antiinflamatorios no esteroides (AINE), tales como ibuprofeno y naproxeno.  Hable con el mdico antes de tomar cualquier medicamento nuevo. Ciertos medicamentos pueden aumentar el riesgo de sangrados peligrosos. Prevencin de cadas   Siga las indicaciones de su mdico sobre cmo puede ayudar a prevenir cadas y Psychologist, occupationallesiones en el hogar. Estas pueden incluir las siguientes: ? Quitar de los pasillos alfombras sueltas, cables y otros obstculos que puedan representar un peligro de tropiezo. ? Instalar barras para sostn en el bao. ? Uso de luces nocturnas. Instrucciones generales  Informe a todos sus mdicos, incluido su dentista, que usted tiene un trastorno hemorrgico. Manufacturing engineerAsegrese de informrselo a  los mdicos antes de que le realicen cualquier procedimiento, incluyendo limpiezas dentales.  No practique  deportes de contacto ni haga otras actividades con alto riesgo de lesiones o moretones. Pregntele al mdico qu actividades son seguras para usted.  Cepllese los dientes con un cepillo de cerdas suaves.  Al afeitarse, utilice una Counsellorafeitadora elctrica en lugar de una hoja de Public affairs consultantafeitar.  Use un brazalete de alerta mdica que diga que tiene un trastorno hemorrgico. Esto puede ayudarlo a recibir el tratamiento adecuado en caso de una emergencia.  Concurra a todas las visitas de control como se lo haya indicado el mdico. Esto es importante. Puede ser necesario hacer anlisis de sangre regularmente. Comunquese con un mdico si tiene:  Sntomas nuevos.  Sntomas que empeoran.  Grant RutsFiebre. Solicite ayuda inmediatamente si tiene:  Dolor de Turkmenistancabeza repentino e intenso.  Nuseas repentinas e intensas.  Sangrado intenso.  Vmitos. Resumen  La prpura trombocitopnica idioptica (PTI) es una enfermedad en la que el sistema de defensa del cuerpo (sistema inmunitario) ataca las clulas sanguneas que forman cogulos para ayudar a Associate Professordetener hemorragias en el cuerpo (plaquetas).  La PTI puede ocasionar moretones y hemorragias, incluyendo hemorragia nasal frecuente, sangre en la orina o la materia fecal y sangrado en las encas. En las mujeres, la PTI puede causar perodos menstruales abundantes.  El tratamiento depende de la gravedad de su afeccin. Este puede incluir la terapia con corticoesteroides y medicamentos para reducir la actividad del sistema inmunitario. En algunos casos, es necesario realizar [place] and Herz[place]ovinauna ciruga para extirpar el bazo.  Siga las indicaciones del mdico acerca de tomar medicamentos, prevenir las cadas en la casa, limitar ciertas actividades y cundo Risk analystsolicitar ayuda. Esta informacin no tiene Theme park managercomo fin reemplazar el consejo del mdico. Asegrese de hacerle al mdico cualquier pregunta que tenga. Document Released: 10/01/2013 Document Revised: 05/11/2017 Document Reviewed:  05/11/2017 Elsevier Interactive Patient Education  2019 [employer]

## 2018-03-17 ENCOUNTER — Other Ambulatory Visit: Payer: Self-pay | Admitting: Hematology

## 2018-03-17 DIAGNOSIS — D693 Immune thrombocytopenic purpura: Secondary | ICD-10-CM

## 2018-03-19 ENCOUNTER — Telehealth: Payer: Self-pay | Admitting: Hematology

## 2018-03-19 NOTE — Telephone Encounter (Signed)
Called patient per 12/30 sch message -   Unable to reach patient . Interpreter left message with appt date and time per sch message.   Interpreter : Ezzard StandingMarcial [phone]

## 2018-03-22 ENCOUNTER — Inpatient Hospital Stay: Payer: Self-pay | Admitting: Medical

## 2018-03-22 ENCOUNTER — Inpatient Hospital Stay: Payer: Self-pay

## 2018-03-22 ENCOUNTER — Inpatient Hospital Stay: Payer: Self-pay | Attending: Hematology

## 2018-03-22 DIAGNOSIS — D693 Immune thrombocytopenic purpura: Secondary | ICD-10-CM | POA: Insufficient documentation

## 2018-03-22 DIAGNOSIS — Z79899 Other long term (current) drug therapy: Secondary | ICD-10-CM | POA: Insufficient documentation

## 2018-03-22 DIAGNOSIS — R0602 Shortness of breath: Secondary | ICD-10-CM | POA: Insufficient documentation

## 2018-03-22 DIAGNOSIS — R42 Dizziness and giddiness: Secondary | ICD-10-CM | POA: Insufficient documentation

## 2018-03-22 DIAGNOSIS — R531 Weakness: Secondary | ICD-10-CM | POA: Insufficient documentation

## 2018-03-22 DIAGNOSIS — Z9081 Acquired absence of spleen: Secondary | ICD-10-CM | POA: Insufficient documentation

## 2018-03-24 ENCOUNTER — Telehealth: Payer: Self-pay | Admitting: Hematology

## 2018-03-24 NOTE — Telephone Encounter (Signed)
Called patient re rescheduling 1/3 appointments. Left message via [facility] [place] [phone] re new appointment for 1/6.

## 2018-03-25 ENCOUNTER — Inpatient Hospital Stay (HOSPITAL_BASED_OUTPATIENT_CLINIC_OR_DEPARTMENT_OTHER): Payer: Self-pay | Admitting: Hematology

## 2018-03-25 ENCOUNTER — Inpatient Hospital Stay: Payer: Self-pay

## 2018-03-25 ENCOUNTER — Telehealth: Payer: Self-pay | Admitting: Hematology

## 2018-03-25 ENCOUNTER — Encounter: Payer: Self-pay | Admitting: Hematology

## 2018-03-25 VITALS — BP 118/73 | HR 73 | Temp 98.9°F | Resp 17 | Ht 60.0 in | Wt 248.8 lb

## 2018-03-25 DIAGNOSIS — R42 Dizziness and giddiness: Secondary | ICD-10-CM

## 2018-03-25 DIAGNOSIS — I609 Nontraumatic subarachnoid hemorrhage, unspecified: Secondary | ICD-10-CM

## 2018-03-25 DIAGNOSIS — R531 Weakness: Secondary | ICD-10-CM

## 2018-03-25 DIAGNOSIS — D693 Immune thrombocytopenic purpura: Secondary | ICD-10-CM

## 2018-03-25 DIAGNOSIS — Z79899 Other long term (current) drug therapy: Secondary | ICD-10-CM

## 2018-03-25 DIAGNOSIS — Z9081 Acquired absence of spleen: Secondary | ICD-10-CM

## 2018-03-25 DIAGNOSIS — R0602 Shortness of breath: Secondary | ICD-10-CM

## 2018-03-25 LAB — CBC WITH DIFFERENTIAL (CANCER CENTER ONLY)
Abs Immature Granulocytes: 0.33 10*3/uL — ABNORMAL HIGH (ref 0.00–0.07)
Basophils Absolute: 0 10*3/uL (ref 0.0–0.1)
Basophils Relative: 0 %
Eosinophils Absolute: 0.1 10*3/uL (ref 0.0–0.5)
Eosinophils Relative: 1 %
HEMATOCRIT: 37.8 % (ref 36.0–46.0)
Hemoglobin: 11.9 g/dL — ABNORMAL LOW (ref 12.0–15.0)
Immature Granulocytes: 3 %
Lymphocytes Relative: 23 %
Lymphs Abs: 2.8 10*3/uL (ref 0.7–4.0)
MCH: 28.3 pg (ref 26.0–34.0)
MCHC: 31.5 g/dL (ref 30.0–36.0)
MCV: 89.8 fL (ref 80.0–100.0)
Monocytes Absolute: 1 10*3/uL (ref 0.1–1.0)
Monocytes Relative: 9 %
Neutro Abs: 7.9 10*3/uL — ABNORMAL HIGH (ref 1.7–7.7)
Neutrophils Relative %: 64 %
Platelet Count: 812 10*3/uL — ABNORMAL HIGH (ref 150–400)
RBC: 4.21 MIL/uL (ref 3.87–5.11)
RDW: 15.1 % (ref 11.5–15.5)
WBC Count: 12.2 10*3/uL — ABNORMAL HIGH (ref 4.0–10.5)
nRBC: 0.7 % — ABNORMAL HIGH (ref 0.0–0.2)

## 2018-03-25 NOTE — Telephone Encounter (Signed)
Printed calendar and avs. °

## 2018-03-25 NOTE — Progress Notes (Signed)
[facility]   Telephone:(336) [phone] Fax:(336) [phone]   Clinic Follow up Note   Patient Care Team: [name], [name], MD as PCP - General (General Practice) Levert FeinsteinGranfortuna, James M, MD as Consulting Physician (Oncology) Gardiner Coinswen, John (Hematology and Oncology) Elwin MochaShah, Christopher Tulip, MD as Consulting Physician (Ophthalmology) Justice DeedsMetjian, Ara D, MD (Internal Medicine)  Date of Service:  03/25/2018  CHIEF COMPLAINT: F/u of ITP   CURRENT THERAPY:  NPlate weekly as needed   INTERVAL HISTORY:  [name] is here for a follow up of ITP after being released from the hospital. She presents to the clinic today accompanied by her Spanish interpreter.   She notes since hospitalization she feels, weak and dizzy. She feels the room is spinning and she is lightheaded at times. She also feels she is SOB. She notes she has been drinking enough water.  She denies any episodes of bleeding.  REVIEW OF SYSTEMS:   Constitutional: Denies fevers, chills or abnormal weight loss (+) Occasional dizziness and lightheadedness  Eyes: Denies blurriness of vision Ears, nose, mouth, throat, and face: Denies mucositis or sore throat Respiratory: Denies cough or wheezes (+) Occasional SOB  Cardiovascular: Denies palpitation, chest discomfort or lower extremity swelling Gastrointestinal:  Denies nausea, heartburn or change in bowel habits Skin: Denies abnormal skin rashes Lymphatics: Denies new lymphadenopathy or easy bruising Neurological:Denies numbness, tingling or new weaknesses Behavioral/Psych: Mood is stable, no new changes  All other systems were reviewed with the patient and are negative.  MEDICAL HISTORY:  Past Medical History:  Diagnosis Date  . Anemia   . Chronic ITP (idiopathic thrombocytopenia) (HCC)   . Depression   . Headache   . 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    SURGICAL HISTORY: Past Surgical History:    Procedure Laterality Date  . BREAST SURGERY  biopsy  . 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  ALLERGIES:  has No Known Allergies.  MEDICATIONS:  Current Outpatient Medications  Medication Sig Dispense Refill  . acetaminophen (TYLENOL) 500 MG tablet Take 1-2 tablets (500-1,000 mg total) by mouth every 6 (six) hours as needed for mild pain or moderate pain. (Patient taking differently: Take 500 mg by mouth daily as needed for mild pain or moderate pain. ) 30 tablet 0   No current facility-administered medications for this visit.     PHYSICAL EXAMINATION: ECOG PERFORMANCE STATUS: 2 - Symptomatic, <50% confined to bed  Vitals:   03/25/18 0919  BP: 118/73  Pulse: 73  Resp: 17  Temp: 98.9 F (37.2 C)  SpO2: 100%   Filed Weights   03/25/18 0919  Weight: 248 lb 12.8 oz (112.9 kg)    GENERAL:alert, no distress and comfortable SKIN: skin color, texture, turgor are normal, no rashes or significant lesions EYES: normal, Conjunctiva are pink and non-injected, sclera clear OROPHARYNX:no exudate, no erythema and lips, buccal mucosa, and tongue normal  NECK: supple, thyroid normal size, non-tender, without nodularity LYMPH:  no palpable lymphadenopathy in the cervical, axillary or inguinal LUNGS: clear to auscultation and percussion with normal breathing effort HEART: regular rate & rhythm and no murmurs and no lower extremity edema ABDOMEN:abdomen soft, non-tender and normal bowel sounds Musculoskeletal:no cyanosis of digits and no clubbing  NEURO: alert & oriented x 3 with fluent speech, no focal motor/sensory deficits (+) Ambulates well   LABORATORY DATA:  I have reviewed the data as listed CBC Latest Ref Rng & Units 03/25/2018 03/16/2018 03/15/2018  WBC  4.0 - 10.5 K/uL 12.2(H) 21.1(H) 24.0(H)  Hemoglobin 12.0 - 15.0 g/dL 11.9(L) 10.7(L) 10.6(L)  Hematocrit 36.0 - 46.0 % 37.8 33.3(L) 32.6(L)  Platelets 150 - 400 K/uL 812(H) 174 123(L)     CMP Latest Ref Rng & Units 03/14/2018 03/13/2018 02/04/2018  Glucose 70 - 99 mg/dL 161(W186(H) 96 960(A107(H)  BUN 6 - 20 mg/dL 10 10 11   Creatinine 0.44 - 1.00 mg/dL 5.400.67 9.810.57 1.910.66  Sodium 135 - 145 mmol/L 134(L) 139 137  Potassium 3.5 - 5.1 mmol/L 4.1 3.4(L) 3.6  Chloride 98 - 111 mmol/L 104 105 103  CO2 22 - 32 mmol/L 18(L) 25 26  Calcium 8.9 - 10.3 mg/dL 4.7(W8.7(L) 9.2 9.0  Total Protein 6.5 - 8.1 g/dL - 7.1 7.3  Total Bilirubin 0.3 - 1.2 mg/dL - 0.4 2.9(F0.2(L)  Alkaline Phos 38 - 126 U/L - 60 78  AST 15 - 41 U/L - 18 21  ALT 0 - 44 U/L - 20 20      RADIOGRAPHIC STUDIES: I have personally reviewed the radiological images as listed and agreed with the findings in the report. No results found.   ASSESSMENT & PLAN:  [name] is a 43 y.o. female with    1. Recurrent ITP -She has a history of recurrent ITP. She is s/p splenectomy and treated with steroid, IVIG, NPlate, Rituxan and chemo in the past (the first episode) due to refractory disease -She was recently hospitalized for small subarachnoid hemorrhage and severe thrombocytosis from ITP.  She responded to dexamethasone, IVIG, and Nplate quickly in a few days.  -Today's labs show PLT at 812K. No need for NPlate this week.  -We will continue checking her CBC weekly for the next 2 weeks, and reduce Nplate dose to 1mg /kg if plt<140 -F/u in 4 weeks    2. Small foci of subarachnoid hemorrhage -Secondary to #1 -She still has dizziness and lightheadedness but her vision has improved.  -Will continue to monitor  -I strongly encouraged her to walk slowly and try to avoid fall    PLAN:  -continue lab and Nplate injection weekly X2 -Lab and f/u in 4 weeks     No problem-specific Assessment & Plan notes found for this encounter.   No  orders of the defined types were placed in this encounter.  All questions were answered. The patient knows to call the clinic with any problems, questions or concerns. No barriers to learning was detected. I spent 15 minutes counseling the patient face to face. The total time spent in the appointment was 20 minutes and more than 50% was on counseling and review of test results     Malachy MoodYan Danashia Landers, MD 03/25/2018   I, Delphina CahillAmoya Bennett, am acting as scribe for Malachy MoodYan Havish Petties, MD.   I have reviewed the above documentation for accuracy and completeness, and I agree with the above.

## 2018-03-25 NOTE — Progress Notes (Signed)
Platelets today 812, N-plate injection not indicated. Copy of labs provided and pt updated on plan. Dr. Mosetta Putt to see pt before leaving. Pt verbalized understanding and agreement.

## 2018-03-28 ENCOUNTER — Inpatient Hospital Stay: Payer: Self-pay

## 2018-03-28 DIAGNOSIS — D693 Immune thrombocytopenic purpura: Secondary | ICD-10-CM

## 2018-03-28 LAB — CBC WITH DIFFERENTIAL (CANCER CENTER ONLY)
Abs Immature Granulocytes: 0.08 10*3/uL — ABNORMAL HIGH (ref 0.00–0.07)
Basophils Absolute: 0.1 10*3/uL (ref 0.0–0.1)
Basophils Relative: 1 %
Eosinophils Absolute: 0.1 10*3/uL (ref 0.0–0.5)
Eosinophils Relative: 1 %
HCT: 37.9 % (ref 36.0–46.0)
HEMOGLOBIN: 12.5 g/dL (ref 12.0–15.0)
Immature Granulocytes: 1 %
Lymphocytes Relative: 24 %
Lymphs Abs: 2.6 10*3/uL (ref 0.7–4.0)
MCH: 29.3 pg (ref 26.0–34.0)
MCHC: 33 g/dL (ref 30.0–36.0)
MCV: 88.8 fL (ref 80.0–100.0)
Monocytes Absolute: 0.9 10*3/uL (ref 0.1–1.0)
Monocytes Relative: 9 %
Neutro Abs: 7.1 10*3/uL (ref 1.7–7.7)
Neutrophils Relative %: 64 %
Platelet Count: 668 10*3/uL — ABNORMAL HIGH (ref 150–400)
RBC: 4.27 MIL/uL (ref 3.87–5.11)
RDW: 14.8 % (ref 11.5–15.5)
WBC Count: 10.9 10*3/uL — ABNORMAL HIGH (ref 4.0–10.5)
nRBC: 0.8 % — ABNORMAL HIGH (ref 0.0–0.2)

## 2018-03-28 NOTE — Progress Notes (Signed)
Per parameters Pt. Did not receive injection Platelets were 668.  Copy of Lab results given. Pt verbalized understanding.

## 2018-04-04 ENCOUNTER — Ambulatory Visit: Payer: Self-pay | Admitting: Hematology

## 2018-04-04 ENCOUNTER — Ambulatory Visit: Payer: Self-pay

## 2018-04-04 ENCOUNTER — Inpatient Hospital Stay: Payer: Self-pay

## 2018-04-04 DIAGNOSIS — D693 Immune thrombocytopenic purpura: Secondary | ICD-10-CM

## 2018-04-04 LAB — CBC WITH DIFFERENTIAL (CANCER CENTER ONLY)
Abs Immature Granulocytes: 0.03 10*3/uL (ref 0.00–0.07)
Basophils Absolute: 0 10*3/uL (ref 0.0–0.1)
Basophils Relative: 1 %
Eosinophils Absolute: 0.1 10*3/uL (ref 0.0–0.5)
Eosinophils Relative: 1 %
HCT: 37.5 % (ref 36.0–46.0)
Hemoglobin: 12 g/dL (ref 12.0–15.0)
Immature Granulocytes: 0 %
Lymphocytes Relative: 34 %
Lymphs Abs: 3 10*3/uL (ref 0.7–4.0)
MCH: 28.6 pg (ref 26.0–34.0)
MCHC: 32 g/dL (ref 30.0–36.0)
MCV: 89.5 fL (ref 80.0–100.0)
MONO ABS: 0.8 10*3/uL (ref 0.1–1.0)
Monocytes Relative: 9 %
NEUTROS ABS: 4.9 10*3/uL (ref 1.7–7.7)
NEUTROS PCT: 55 %
PLATELETS: 401 10*3/uL — AB (ref 150–400)
RBC: 4.19 MIL/uL (ref 3.87–5.11)
RDW: 14.6 % (ref 11.5–15.5)
WBC Count: 8.9 10*3/uL (ref 4.0–10.5)
nRBC: 0 % (ref 0.0–0.2)

## 2018-04-04 NOTE — Progress Notes (Signed)
Pt. Did not receive injection today Per plan parameters. Copy of labs given, Pt verbalized understanding

## 2018-04-11 ENCOUNTER — Inpatient Hospital Stay: Payer: Self-pay

## 2018-04-11 VITALS — BP 119/102 | HR 78 | Temp 97.9°F | Resp 18

## 2018-04-11 DIAGNOSIS — D693 Immune thrombocytopenic purpura: Secondary | ICD-10-CM

## 2018-04-11 LAB — CBC WITH DIFFERENTIAL (CANCER CENTER ONLY)
Abs Immature Granulocytes: 0.12 10*3/uL — ABNORMAL HIGH (ref 0.00–0.07)
Basophils Absolute: 0.1 10*3/uL (ref 0.0–0.1)
Basophils Relative: 0 %
Eosinophils Absolute: 0.2 10*3/uL (ref 0.0–0.5)
Eosinophils Relative: 2 %
HCT: 36.8 % (ref 36.0–46.0)
Hemoglobin: 12.1 g/dL (ref 12.0–15.0)
Immature Granulocytes: 1 %
Lymphocytes Relative: 35 %
Lymphs Abs: 4.2 10*3/uL — ABNORMAL HIGH (ref 0.7–4.0)
MCH: 29.3 pg (ref 26.0–34.0)
MCHC: 32.9 g/dL (ref 30.0–36.0)
MCV: 89.1 fL (ref 80.0–100.0)
Monocytes Absolute: 0.8 10*3/uL (ref 0.1–1.0)
Monocytes Relative: 7 %
Neutro Abs: 6.4 10*3/uL (ref 1.7–7.7)
Neutrophils Relative %: 55 %
Platelet Count: 332 10*3/uL (ref 150–400)
RBC: 4.13 MIL/uL (ref 3.87–5.11)
RDW: 14.5 % (ref 11.5–15.5)
WBC Count: 11.9 10*3/uL — ABNORMAL HIGH (ref 4.0–10.5)
nRBC: 0.2 % (ref 0.0–0.2)

## 2018-04-11 MED ORDER — ROMIPLOSTIM 250 MCG ~~LOC~~ SOLR: 1.0000 ug/kg | Freq: Once | SUBCUTANEOUS | Status: DC

## 2018-04-11 NOTE — Progress Notes (Signed)
No n-plate today per MD Mosetta Putt

## 2018-04-19 NOTE — Progress Notes (Signed)
[facility]   Telephone:(336) [phone] Fax:(336) [phone]   Clinic Follow up Note   Patient Care Team: Gayla Doss., MD as PCP - General (General Practice) Levert Feinstein, MD as Consulting Physician (Oncology) [name] (Hematology and Oncology) Elwin Mocha, MD as Consulting Physician (Ophthalmology) Justice Deeds, MD (Internal Medicine)  Date of Service:  04/22/2018  CHIEF COMPLAINT: F/u of ITP   CURRENT THERAPY:  Observation    INTERVAL HISTORY:  [name] is here for a follow up of ITP. She presents to the clinic today with her daughter and interpreter. She notes she is a little dizzy and weakness and feels like she may pass out. This is overall improvement from last visit. She denies any signs of bleeding. She notes mild numbness of her hands.    REVIEW OF SYSTEMS:   Constitutional: Denies fevers, chills or abnormal weight loss (+) Dizziness, lightheadedness  Eyes: Denies blurriness of vision Ears, nose, mouth, throat, and face: Denies mucositis or sore throat Respiratory: Denies cough, dyspnea or wheezes Cardiovascular: Denies palpitation, chest discomfort or lower extremity swelling Gastrointestinal:  Denies nausea, heartburn or change in bowel habits Skin: Denies abnormal skin rashes Lymphatics: Denies new lymphadenopathy or easy bruising Neurological: (+) Mild numbness of hands  Behavioral/Psych: Mood is stable, no new changes  All other systems were reviewed with the patient and are negative.  MEDICAL HISTORY:  Past Medical History:  Diagnosis Date  . Anemia   . Chronic ITP (idiopathic thrombocytopenia) (HCC)   . Depression   . Headache   . 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 BREAST SURGERY  biopsy  . 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  ALLERGIES:  has No Known Allergies.  MEDICATIONS:  Current Outpatient Medications  Medication Sig Dispense Refill  . acetaminophen (TYLENOL) 500 MG tablet Take 1-2 tablets (500-1,000 mg total) by mouth every 6 (six) hours as needed for mild pain or moderate pain. (Patient taking differently: Take 500 mg by mouth daily as needed for mild pain or moderate pain. ) 30 tablet 0   No current facility-administered medications for this visit.     PHYSICAL EXAMINATION: ECOG PERFORMANCE STATUS: 1 - Symptomatic but completely ambulatory  Vitals:   04/22/18 1330 04/22/18 1332  BP: 99/67 (!) 100/57  Pulse: 82 93  Resp: 18   Temp:    SpO2: 99% 98%   Filed Weights   04/22/18 1249  Weight: 248 lb 9.6 oz (112.8 kg)    GENERAL:alert, no distress and comfortable SKIN: skin color, texture, turgor are normal, no rashes or significant lesions EYES: normal, Conjunctiva are pink and non-injected, sclera clear OROPHARYNX:no exudate, no erythema and lips, buccal mucosa, and tongue normal  NECK: supple, thyroid normal size, non-tender, without nodularity LYMPH:  no palpable lymphadenopathy in the cervical, axillary or inguinal LUNGS: clear to auscultation and percussion with normal breathing effort HEART: regular rate & rhythm and no murmurs and no lower extremity edema ABDOMEN:abdomen soft, non-tender and normal bowel sounds Musculoskeletal:no cyanosis of digits and no clubbing  NEURO: alert & oriented x 3 with fluent speech, no focal motor/sensory deficits  LABORATORY  DATA:  I have reviewed the data as listed CBC Latest Ref Rng & Units 04/22/2018 04/11/2018 04/04/2018  WBC 4.0 - 10.5 K/uL 12.4(H) 11.9(H) 8.9  Hemoglobin 12.0 - 15.0 g/dL 53.2 99.2 42.6  Hematocrit 36.0 - 46.0 % 39.7 36.8 37.5  Platelets 150 - 400 K/uL  377 332 401(H)     CMP Latest Ref Rng & Units 03/14/2018 03/13/2018 02/04/2018  Glucose 70 - 99 mg/dL 834(H) 96 962(I)  BUN 6 - 20 mg/dL 10 10 11   Creatinine 0.44 - 1.00 mg/dL 2.97 9.89 2.11  Sodium 135 - 145 mmol/L 134(L) 139 137  Potassium 3.5 - 5.1 mmol/L 4.1 3.4(L) 3.6  Chloride 98 - 111 mmol/L 104 105 103  CO2 22 - 32 mmol/L 18(L) 25 26  Calcium 8.9 - 10.3 mg/dL 9.4(R) 9.2 9.0  Total Protein 6.5 - 8.1 g/dL - 7.1 7.3  Total Bilirubin 0.3 - 1.2 mg/dL - 0.4 7.4(Y)  Alkaline Phos 38 - 126 U/L - 60 78  AST 15 - 41 U/L - 18 21  ALT 0 - 44 U/L - 20 20      RADIOGRAPHIC STUDIES: I have personally reviewed the radiological images as listed and agreed with the findings in the report. No results found.   ASSESSMENT & PLAN:  [name] is a 43 y.o. female with   1. Recurrent ITP -She has a history of recurrent ITP. She is s/p splenectomy and treated with steroid, IVIG, NPlate, Rituxan and chemo in the past (the first episode) due to refractory disease -She was recently hospitalized for small subarachnoid hemorrhage and severe thrombocytosis from ITP.  She responded to dexamethasone, IVIG, and Nplate quickly in a few days.  -Labs reviewed, CBC WNL except WBC at 12.4. Plat are normal at 377K. No need for NPlate injection today.  -She has not needed NPlate injection since 02/2018. I will stop NPlate injections for now.  -We discussed her that her ITP may flareup again in the future, she knows what to watch. -Will monitor with labs every 3 months  -F/u in 6 months    2. Small foci of subarachnoid hemorrhage -Secondary to #1 -recovered well overall  -Will continue to monitor  3. Dizziness, lightheadedness -She knows to walk slowly and try to avoid fall  -She still has dizziness and lightheadedness but her vision has improved.  -Her BP is also lower at 101/57 today (04/22/18).  We checked orthostatic vital signs which was negative. -I recommend she add salt to her  diet.  And drink fluids adequately.   PLAN:  Lab in 3 months Lab and f/u in 6 months    No problem-specific Assessment & Plan notes found for this encounter.   No orders of the defined types were placed in this encounter.  All questions were answered. The patient knows to call the clinic with any problems, questions or concerns. No barriers to learning was detected. I spent 10 minutes counseling the patient face to face. The total time spent in the appointment was 15 minutes and more than 50% was on counseling and review of test results     Malachy Mood, MD 04/22/2018   I, Delphina Cahill, am acting as scribe for Malachy Mood, MD.   I have reviewed the above documentation for accuracy and completeness, and I agree with the above.

## 2018-04-22 ENCOUNTER — Encounter: Payer: Self-pay | Admitting: Hematology

## 2018-04-22 ENCOUNTER — Inpatient Hospital Stay: Payer: Self-pay | Attending: Hematology

## 2018-04-22 ENCOUNTER — Inpatient Hospital Stay (HOSPITAL_BASED_OUTPATIENT_CLINIC_OR_DEPARTMENT_OTHER): Payer: Self-pay | Admitting: Hematology

## 2018-04-22 VITALS — BP 100/57 | HR 93 | Temp 98.2°F | Resp 18 | Ht 60.0 in | Wt 248.6 lb

## 2018-04-22 DIAGNOSIS — R42 Dizziness and giddiness: Secondary | ICD-10-CM

## 2018-04-22 DIAGNOSIS — Z9081 Acquired absence of spleen: Secondary | ICD-10-CM

## 2018-04-22 DIAGNOSIS — D693 Immune thrombocytopenic purpura: Secondary | ICD-10-CM | POA: Insufficient documentation

## 2018-04-22 DIAGNOSIS — R200 Anesthesia of skin: Secondary | ICD-10-CM | POA: Insufficient documentation

## 2018-04-22 DIAGNOSIS — Z79899 Other long term (current) drug therapy: Secondary | ICD-10-CM

## 2018-04-22 DIAGNOSIS — R531 Weakness: Secondary | ICD-10-CM

## 2018-04-22 LAB — CBC WITH DIFFERENTIAL (CANCER CENTER ONLY)
Abs Immature Granulocytes: 0.08 10*3/uL — ABNORMAL HIGH (ref 0.00–0.07)
Basophils Absolute: 0.1 10*3/uL (ref 0.0–0.1)
Basophils Relative: 0 %
Eosinophils Absolute: 0.1 10*3/uL (ref 0.0–0.5)
Eosinophils Relative: 1 %
HCT: 39.7 % (ref 36.0–46.0)
Hemoglobin: 12.9 g/dL (ref 12.0–15.0)
Immature Granulocytes: 1 %
Lymphocytes Relative: 33 %
Lymphs Abs: 4.1 10*3/uL — ABNORMAL HIGH (ref 0.7–4.0)
MCH: 28.8 pg (ref 26.0–34.0)
MCHC: 32.5 g/dL (ref 30.0–36.0)
MCV: 88.6 fL (ref 80.0–100.0)
MONOS PCT: 6 %
Monocytes Absolute: 0.8 10*3/uL (ref 0.1–1.0)
Neutro Abs: 7.3 10*3/uL (ref 1.7–7.7)
Neutrophils Relative %: 59 %
Platelet Count: 377 10*3/uL (ref 150–400)
RBC: 4.48 MIL/uL (ref 3.87–5.11)
RDW: 14.4 % (ref 11.5–15.5)
WBC Count: 12.4 10*3/uL — ABNORMAL HIGH (ref 4.0–10.5)
nRBC: 0 % (ref 0.0–0.2)

## 2018-05-06 ENCOUNTER — Emergency Department (HOSPITAL_COMMUNITY)
Admission: EM | Admit: 2018-05-06 | Discharge: 2018-05-07 | Disposition: A | Discharge status: Home / Self Care | Payer: Self-pay | Attending: Emergency Medicine | Admitting: Emergency Medicine

## 2018-05-06 ENCOUNTER — Other Ambulatory Visit: Payer: Self-pay

## 2018-05-06 DIAGNOSIS — H9311 Tinnitus, right ear: Secondary | ICD-10-CM | POA: Insufficient documentation

## 2018-05-06 DIAGNOSIS — G44209 Tension-type headache, unspecified, not intractable: Secondary | ICD-10-CM | POA: Insufficient documentation

## 2018-05-06 DIAGNOSIS — R030 Elevated blood-pressure reading, without diagnosis of hypertension: Secondary | ICD-10-CM | POA: Insufficient documentation

## 2018-05-06 NOTE — ED Triage Notes (Signed)
Pt c/o right ear pain and headache and a strange sound that she cannot describe. States that this has been going on for a couple of weeks but the past three days it has gotten much worse. States that pain is 9/10.

## 2018-05-07 MED ORDER — HYDROCHLOROTHIAZIDE 12.5 MG PO TABS: 12.5000 mg | ORAL_TABLET | Freq: Every day | ORAL | 0 refills | Status: DC

## 2018-05-07 MED ORDER — HYDROCHLOROTHIAZIDE 12.5 MG PO CAPS
12.5000 mg | ORAL_CAPSULE | Freq: Every day | ORAL | Status: DC
  Administered 2018-05-07: 12.5 mg via ORAL
  Filled 2018-05-07: qty 1

## 2018-05-07 MED ORDER — KETOROLAC TROMETHAMINE 30 MG/ML IJ SOLN
30.0000 mg | Freq: Once | INTRAMUSCULAR | Status: AC
  Administered 2018-05-07: 30 mg via INTRAMUSCULAR
  Filled 2018-05-07: qty 1

## 2018-05-07 NOTE — Discharge Instructions (Signed)
Your blood pressure is quite high.  This can cause the sound in your ear.  You need to see  your doctor.  I have started you on low dose blood pressure medication that should help with these symptoms.  Return to the ER for new or worsening symptoms.

## 2018-05-07 NOTE — ED Provider Notes (Signed)
[name] EMERGENCY DEPARTMENT Provider Note   CSN: [id number] Arrival date & time: 05/06/18  2059    History   Chief Complaint Chief Complaint  Patient presents with  . right ear pain    HPI [name] is a 43 y.o. female.     Patient presents to the emergency department with a chief complaint of tinnitus.  She reports having had the tinnitus for the past several days.  She describes it as a whooshing sounds that she can hear in her right ear.  She denies ever having had this before.  Denies having history of high blood pressure.  She denies any vision changes, numbness, weakness, tingling.  She states that she has had a headache, and has a history of headaches.  She states that she has some muscle soreness on the right side of her neck and upper shoulder.  The history is provided by the patient. No language interpreter was used.    Past Medical History:  Diagnosis Date  . Anemia   . Chronic ITP (idiopathic thrombocytopenia) (HCC)   . Depression   . Headache   . 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 Chronic ITP (idiopathic thrombocytopenia) (HCC)   . Nontraumatic intracerebral hemorrhage (HCC)   . Menorrhagia with regular cycle   . Pressure ulcer 09/19/2015  . Gingival bleeding   . Chronic hepatitis B (HCC) 09/04/2015  . Subarachnoidal hemorrhage (HCC) 09/04/2015  . Severe thrombocytopenia (HCC) 09/04/2015  . Acute ITP (HCC) 08/28/2015    Past Surgical History:  Procedure Laterality Date  . BREAST SURGERY  biopsy  . 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 Authorizing Provider  acetaminophen (TYLENOL) 500 MG tablet Take 1-2 tablets (500-1,000 mg total) by mouth every 6 (six) hours as needed for mild pain or moderate pain. Patient taking differently: Take 500 mg by mouth daily as needed for mild pain or moderate pain.  09/22/15   Burns, Tinnie Gens, MD  hydrochlorothiazide (HYDRODIURIL) 12.5 MG tablet Take 1 tablet (12.5 mg total) by mouth daily. 05/07/18   Roxy Horseman, PA-C    Family History No family history on file.  Social History Social History   Tobacco Use  . Smoking status: Never Smoker  . Smokeless tobacco: Never Used  Substance Use Topics  . Alcohol use: No    Alcohol/week: 0.0 standard drinks  . Drug use: No     Allergies   Patient has no known allergies.   Review of Systems Review of Systems  All other systems reviewed and are negative.    Physical Exam Updated Vital Signs BP (!) 155/128 (BP Location: Right Wrist)   Pulse 78   Temp  98.1 F (36.7 C) (Oral)   Resp 14   Ht 5' (1.524 m)   Wt 112 kg   LMP 04/30/2018 (Exact Date)   SpO2 98%   BMI 48.24 kg/m   Physical Exam Vitals signs and nursing note reviewed.  Constitutional:      Appearance: She is well-developed.  HENT:     Head: Normocephalic and atraumatic.     Right Ear: External ear normal.     Left Ear: External ear normal.  Eyes:     Conjunctiva/sclera: Conjunctivae normal.     Pupils: Pupils are equal, round, and reactive to light.  Neck:     Musculoskeletal: Normal range of motion and neck supple.     Comments: No pain with neck flexion, no meningismus Cardiovascular:     Rate and Rhythm: Normal rate and regular rhythm.     Heart sounds: Normal heart sounds. No murmur. No friction rub. No gallop.   Pulmonary:     Effort: Pulmonary effort is normal. No respiratory distress.      Breath sounds: Normal breath sounds. No wheezing or rales.  Chest:     Chest wall: No tenderness.  Abdominal:     General: There is no distension.     Palpations: Abdomen is soft. There is no mass.     Tenderness: There is no abdominal tenderness. There is no guarding or rebound.  Musculoskeletal: Normal range of motion.        General: No tenderness.     Comments: Normal gait.  Skin:    General: Skin is warm and dry.  Neurological:     Mental Status: She is alert and oriented to person, place, and time.     Deep Tendon Reflexes: Reflexes are normal and symmetric.     Comments: CN 3-12 intact, normal finger to nose, no pronator drift, sensation and strength intact bilaterally.  Psychiatric:        Behavior: Behavior normal.        Thought Content: Thought content normal.        Judgment: Judgment normal.      ED Treatments / Results  Labs (all labs ordered are listed, but only abnormal results are displayed) Labs Reviewed - No data to display  EKG None  Radiology No results found.  Procedures Procedures (including critical care time)  Medications Ordered in ED Medications  hydrochlorothiazide (MICROZIDE) capsule 12.5 mg (12.5 mg Oral Given 05/07/18 0400)  ketorolac (TORADOL) 30 MG/ML injection 30 mg (has no administration in time range)     Initial Impression / Assessment and Plan / ED Course  I have reviewed the triage vital signs and the nursing notes.  Pertinent labs & imaging results that were available during my care of the patient were reviewed by me and considered in my medical decision making (see chart for details).        Pt HA treated and improved while in ED.  Presentation is consistent with tension headache and not concerning for [facility], ICH, Meningitis, or temporal arteritis.  She has had no vision changes.  She exhibits muscular tenderness over her right upper trapezius and cervical paraspinal muscles.  I believe that the tenderness is due to her blood  pressure being high.  This could be in part due to her headache.  I will give her a low-dose of HCTZ.  I have instructed her to follow-up closely with her primary care doctor.  She is in no acute distress.  She is neurovascularly intact.  Pt is afebrile with no focal neuro deficits, nuchal rigidity, or change in vision. Pt is to follow up with PCP to discuss prophylactic medication. Pt verbalizes understanding and is agreeable with plan to dc.    Final Clinical Impressions(s) / ED Diagnoses   Final diagnoses:  Tinnitus of right ear  Tension headache  Elevated BP without diagnosis of hypertension    ED Discharge Orders         Ordered    hydrochlorothiazide (HYDRODIURIL) 12.5 MG tablet  Daily     05/07/18 0425           Roxy Horseman, PA-C 05/07/18 0431    Ward, Layla Maw, DO 05/07/18 [id number]

## 2018-07-22 ENCOUNTER — Inpatient Hospital Stay: Payer: Self-pay | Attending: Hematology

## 2018-10-14 ENCOUNTER — Other Ambulatory Visit: Payer: Self-pay

## 2018-10-14 ENCOUNTER — Inpatient Hospital Stay (HOSPITAL_COMMUNITY)
Admission: EM | Admit: 2018-10-14 | Discharge: 2018-10-16 | DRG: 813 | Disposition: A | Discharge status: Home / Self Care | Payer: Self-pay | Attending: Internal Medicine | Admitting: Internal Medicine

## 2018-10-14 ENCOUNTER — Emergency Department (HOSPITAL_COMMUNITY): Payer: Self-pay

## 2018-10-14 ENCOUNTER — Telehealth: Payer: Self-pay

## 2018-10-14 ENCOUNTER — Encounter (HOSPITAL_COMMUNITY): Payer: Self-pay | Admitting: Emergency Medicine

## 2018-10-14 DIAGNOSIS — Z6841 Body Mass Index (BMI) 40.0 and over, adult: Secondary | ICD-10-CM

## 2018-10-14 DIAGNOSIS — R51 Headache: Secondary | ICD-10-CM | POA: Diagnosis present

## 2018-10-14 DIAGNOSIS — E669 Obesity, unspecified: Secondary | ICD-10-CM

## 2018-10-14 DIAGNOSIS — Z79899 Other long term (current) drug therapy: Secondary | ICD-10-CM

## 2018-10-14 DIAGNOSIS — Z8673 Personal history of transient ischemic attack (TIA), and cerebral infarction without residual deficits: Secondary | ICD-10-CM

## 2018-10-14 DIAGNOSIS — F19982 Other psychoactive substance use, unspecified with psychoactive substance-induced sleep disorder: Secondary | ICD-10-CM

## 2018-10-14 DIAGNOSIS — R739 Hyperglycemia, unspecified: Secondary | ICD-10-CM | POA: Diagnosis present

## 2018-10-14 DIAGNOSIS — R7303 Prediabetes: Secondary | ICD-10-CM | POA: Diagnosis present

## 2018-10-14 DIAGNOSIS — H9311 Tinnitus, right ear: Secondary | ICD-10-CM | POA: Diagnosis present

## 2018-10-14 DIAGNOSIS — D693 Immune thrombocytopenic purpura: Principal | ICD-10-CM | POA: Diagnosis present

## 2018-10-14 DIAGNOSIS — D696 Thrombocytopenia, unspecified: Secondary | ICD-10-CM

## 2018-10-14 DIAGNOSIS — R42 Dizziness and giddiness: Secondary | ICD-10-CM | POA: Diagnosis present

## 2018-10-14 DIAGNOSIS — Z713 Dietary counseling and surveillance: Secondary | ICD-10-CM

## 2018-10-14 DIAGNOSIS — G4700 Insomnia, unspecified: Secondary | ICD-10-CM | POA: Diagnosis present

## 2018-10-14 DIAGNOSIS — Z9081 Acquired absence of spleen: Secondary | ICD-10-CM

## 2018-10-14 DIAGNOSIS — Z20828 Contact with and (suspected) exposure to other viral communicable diseases: Secondary | ICD-10-CM | POA: Diagnosis present

## 2018-10-14 LAB — CBC WITH DIFFERENTIAL/PLATELET
Abs Immature Granulocytes: 0.07 10*3/uL (ref 0.00–0.07)
Basophils Absolute: 0.1 10*3/uL (ref 0.0–0.1)
Basophils Relative: 1 %
Eosinophils Absolute: 0.2 10*3/uL (ref 0.0–0.5)
Eosinophils Relative: 1 %
HCT: 38.5 % (ref 36.0–46.0)
Hemoglobin: 12.6 g/dL (ref 12.0–15.0)
Immature Granulocytes: 1 %
Lymphocytes Relative: 31 %
Lymphs Abs: 3.2 10*3/uL (ref 0.7–4.0)
MCH: 29.1 pg (ref 26.0–34.0)
MCHC: 32.7 g/dL (ref 30.0–36.0)
MCV: 88.9 fL (ref 80.0–100.0)
Monocytes Absolute: 0.9 10*3/uL (ref 0.1–1.0)
Monocytes Relative: 9 %
Neutro Abs: 6.1 10*3/uL (ref 1.7–7.7)
Neutrophils Relative %: 57 %
Platelets: 17 10*3/uL — CL (ref 150–400)
RBC: 4.33 MIL/uL (ref 3.87–5.11)
RDW: 14 % (ref 11.5–15.5)
WBC: 10.4 10*3/uL (ref 4.0–10.5)
nRBC: 0.2 % (ref 0.0–0.2)

## 2018-10-14 LAB — HEMOGLOBIN A1C
Hgb A1c MFr Bld: 6 % — ABNORMAL HIGH (ref 4.8–5.6)
Mean Plasma Glucose: 125.5 mg/dL

## 2018-10-14 LAB — URINALYSIS, ROUTINE W REFLEX MICROSCOPIC
Bilirubin Urine: NEGATIVE
Glucose, UA: NEGATIVE mg/dL
Ketones, ur: NEGATIVE mg/dL
Nitrite: NEGATIVE
Protein, ur: NEGATIVE mg/dL
Specific Gravity, Urine: 1.02 (ref 1.005–1.030)
pH: 6 (ref 5.0–8.0)

## 2018-10-14 LAB — COMPREHENSIVE METABOLIC PANEL
ALT: 24 U/L (ref 0–44)
AST: 20 U/L (ref 15–41)
Albumin: 3.7 g/dL (ref 3.5–5.0)
Alkaline Phosphatase: 61 U/L (ref 38–126)
Anion gap: 10 (ref 5–15)
BUN: 10 mg/dL (ref 6–20)
CO2: 25 mmol/L (ref 22–32)
Calcium: 9.2 mg/dL (ref 8.9–10.3)
Chloride: 104 mmol/L (ref 98–111)
Creatinine, Ser: 0.66 mg/dL (ref 0.44–1.00)
GFR calc Af Amer: >60 mL/min (ref >60)
GFR calc non Af Amer: >60 mL/min (ref >60)
Glucose, Bld: 116 mg/dL — ABNORMAL HIGH (ref 70–99)
Potassium: 3.7 mmol/L (ref 3.5–5.1)
Sodium: 139 mmol/L (ref 135–145)
Total Bilirubin: 0.4 mg/dL (ref 0.3–1.2)
Total Protein: 6.6 g/dL (ref 6.5–8.1)

## 2018-10-14 LAB — SARS CORONAVIRUS 2 BY RT PCR (HOSPITAL ORDER, PERFORMED IN ~~LOC~~ HOSPITAL LAB): SARS Coronavirus 2: NEGATIVE

## 2018-10-14 LAB — I-STAT BETA HCG BLOOD, ED (MC, WL, AP ONLY): I-stat hCG, quantitative: <5 m[IU]/mL (ref <5)

## 2018-10-14 MED ORDER — DIPHENHYDRAMINE HCL 50 MG/ML IJ SOLN
25.0000 mg | Freq: Once | INTRAMUSCULAR | Status: AC
  Administered 2018-10-14: 25 mg via INTRAVENOUS
  Filled 2018-10-14: qty 1

## 2018-10-14 MED ORDER — DEXAMETHASONE 6 MG PO TABS
40.0000 mg | ORAL_TABLET | Freq: Every day | ORAL | Status: DC
  Administered 2018-10-14 – 2018-10-16 (×3): 40 mg via ORAL
  Filled 2018-10-14 (×2): qty 1

## 2018-10-14 MED ORDER — ACETAMINOPHEN 325 MG PO TABS
650.0000 mg | ORAL_TABLET | Freq: Four times a day (QID) | ORAL | Status: DC | PRN
  Administered 2018-10-15 (×2): 650 mg via ORAL
  Filled 2018-10-14 (×2): qty 2

## 2018-10-14 MED ORDER — ROMIPLOSTIM 250 MCG ~~LOC~~ SOLR
2.0000 ug/kg | Freq: Once | SUBCUTANEOUS | Status: AC
  Administered 2018-10-15: 215 ug via SUBCUTANEOUS
  Filled 2018-10-14: qty 0.43

## 2018-10-14 MED ORDER — ONDANSETRON HCL 4 MG/2ML IJ SOLN
4.0000 mg | Freq: Once | INTRAMUSCULAR | Status: AC
  Administered 2018-10-14: 13:00:00 4 mg via INTRAVENOUS
  Filled 2018-10-14: qty 2

## 2018-10-14 MED ORDER — SODIUM CHLORIDE 0.9 % IV BOLUS
1000.0000 mL | Freq: Once | INTRAVENOUS | Status: AC
  Administered 2018-10-14: 1000 mL via INTRAVENOUS

## 2018-10-14 MED ORDER — METOCLOPRAMIDE HCL 5 MG/ML IJ SOLN
10.0000 mg | Freq: Once | INTRAMUSCULAR | Status: AC
  Administered 2018-10-14: 13:00:00 10 mg via INTRAVENOUS
  Filled 2018-10-14: qty 2

## 2018-10-14 MED ORDER — DOCUSATE SODIUM 100 MG PO CAPS
100.0000 mg | ORAL_CAPSULE | Freq: Two times a day (BID) | ORAL | Status: DC
  Administered 2018-10-14 – 2018-10-16 (×4): 100 mg via ORAL
  Filled 2018-10-14 (×4): qty 1

## 2018-10-14 MED ORDER — ONDANSETRON HCL 4 MG PO TABS: 4.0000 mg | ORAL_TABLET | Freq: Four times a day (QID) | ORAL | Status: DC | PRN

## 2018-10-14 MED ORDER — ACETAMINOPHEN 650 MG RE SUPP: 650.0000 mg | Freq: Four times a day (QID) | RECTAL | Status: DC | PRN

## 2018-10-14 MED ORDER — ONDANSETRON HCL 4 MG/2ML IJ SOLN: 4.0000 mg | Freq: Four times a day (QID) | INTRAMUSCULAR | Status: DC | PRN

## 2018-10-14 NOTE — H&P (Signed)
History and Physical    [name] WUJ:[id number] DOB: 31-Mar-1975 DOA: 10/14/2018  PCP: Charlynne Pander., MD Consultants:  Burr Medico - hematology Patient coming from:  Home - lives with husband and children; NOK: Husband, [phone]; daughter - speaks [place], [phone]  Chief Complaint: Headache  HPI: [name] is a 43 y.o. 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  She is presenting today with headache similar to prior flares.  She noticed a headache for about a week and she noticed bruising on her legs yesterday.  She felt dizzy like she was going to pass out.  She does note difficulty extending her foot and difficulty with foot pain, which is more chronic in nature.  ED Course:  H/o chronic idiopathic ITP, presenting with B LE bruising and headache.  No h/o trauma or falls.  Concerned for relapse.  No neuro deficits.  Scattered bruising on thighs and otherwise unremarkable.  CT head unremarkable, no SAH.  Platelets 17, new (300 in February).  Dr. Burr Medico suggests admission and will consult.  She is ordering NPlate and steroids.  Review of Systems: As per HPI; otherwise review of systems reviewed and negative.   Ambulatory Status:  Ambulates without assistance  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    Past Surgical History:  Procedure Laterality Date   BREAST SURGERY  biopsy   SPLENECTOMY, TOTAL N/A 09/16/2015   Procedure: OPEN SPLENECTOMY;  Surgeon: Autumn Messing III, MD;  Location: [facility];  Service: General;  Laterality: N/A;    Social History   Socioeconomic History   Marital status: Married    Spouse name: Not on  file   Number of children: Not on file   Years of education: Not on file   Highest education level: Not on file  Occupational History   Occupation: packs [employer]  Social Needs   Financial resource strain: Not on file   Food insecurity    Worry: Not on file    Inability: Not on file   Transportation needs    Medical: Not on file    Non-medical: Not on file  Tobacco Use   Smoking status: Never Smoker   Smokeless tobacco: Never Used  Substance and Sexual Activity   Alcohol use: No    Alcohol/week: 0.0 standard drinks   Drug use: No   Sexual activity: Not on file  Lifestyle   Physical activity    Days per week: Not on file    Minutes per session: Not on file   Stress: Not on file  Relationships   Social connections    Talks on phone: Not on file    Gets together: Not on file    Attends religious service: Not on file    Active member of club or organization: Not on file    Attends meetings of clubs or organizations: Not on file    Relationship status: Not on file   Intimate partner violence    Fear of current or ex partner: Not on file    Emotionally abused: Not on file    Physically abused: Not on file    Forced sexual activity: Not on file  Other  Topics Concern   Not on file  Social History Narrative   Not on file    No Known Allergies  History reviewed. No pertinent family history.  Prior to Admission medications   Medication Sig Start Date End Date Taking? Authorizing Provider  acetaminophen (TYLENOL) 500 MG tablet Take 1-2 tablets (500-1,000 mg total) by mouth every 6 (six) hours as needed for mild pain or moderate pain. Patient taking differently: Take 500 mg by mouth daily as needed for mild pain or moderate pain.  09/22/15   Burns, Tinnie GensAlexa R, MD  hydrochlorothiazide (HYDRODIURIL) 12.5 MG tablet Take 1 tablet (12.5 mg total) by mouth daily. 05/07/18   Roxy HorsemanBrowning, Robert, PA-C    Physical Exam: Vitals:   10/14/18 1101 10/14/18 1300  10/14/18 1432  BP: (!) 119/53 115/72   Pulse: 65 65   Resp: 16 20   Temp: 98.5 F (36.9 C)    TempSrc: Oral    SpO2: 97% 97%   Weight:   107.5 kg      General:  Appears calm and comfortable and is NAD  Eyes:  EOMI, normal lids, iris  ENT:  grossly normal hearing, lips & tongue, mmm; no apparent mucosal bleeding  Neck:  no LAD, masses or thyromegaly  Cardiovascular:  RRR, no m/r/g. No LE edema.   Respiratory:   CTA bilaterally with no wheezes/rales/rhonchi.  Normal respiratory effort.  Abdomen:  soft, NT, ND, NABS  Skin:  no rash or induration seen on limited exam; mild ecchymoses on the B upper thighs     Musculoskeletal:  grossly normal tone BUE/BLE, good ROM, no bony abnormality  Lower extremity:  No LE edema.  Limited foot exam with no ulcerations.  2+ distal pulses.  Psychiatric:  grossly normal mood and affect, speech fluent and appropriate, AOx3  Neurologic:  CN 2-12 grossly intact, moves all extremities in coordinated fashion, sensation intact    Radiological Exams on Admission: Ct Head Wo Contrast  Result Date: 10/14/2018 CLINICAL DATA:  Left-sided headache for 1 week.  Hypertension. EXAM: CT HEAD WITHOUT CONTRAST TECHNIQUE: Contiguous axial images were obtained from the base of the skull through the vertex without intravenous contrast. COMPARISON:  March 16, 2018 and September 20, 2015 FINDINGS: Brain: The ventricles are normal in size and configuration. There is no evident mass. There is no demonstrable hemorrhage, extra-axial fluid collection, or midline shift. Foci of increased attenuation in each frontal-parietal lobe region, slightly more on the right than on the left, is stable compared to prior studies, likely representing focal calcification in these areas. There is no surrounding edema. Elsewhere brain parenchyma appears unremarkable. No evident acute infarct. Vascular: No hyperdense vessel. No appreciable vascular calcification evident. Skull: Bony  calvarium appears intact. Sinuses/Orbits: There is a minimal retention cyst in anterior right ethmoid air cell. There is also minimal mucosal thickening in several ethmoid air cells. Other visualized paranasal sinuses are clear. Orbits appear symmetric bilaterally. Other: Mastoid air cells are clear. IMPRESSION: 1. Stable areas of increased attenuation in the frontal-parietal junction regions, slightly more on the right than on the left. The stability of this appearance is felt to be due to calcification as opposed to hemorrhage. Etiology for calcification in these areas is uncertain. Residua of old trauma is possible. No acute appearing hemorrhage evident at this time. No mass or edema. No acute infarct evident. 2.  Minimal ethmoid sinus disease. Electronically Signed   By: Bretta BangWilliam  Woodruff III M.D.   On: 10/14/2018 12:15    EKG: Independently  reviewed.  NSR with rate 60; no evidence of acute ischemia   Labs on Admission: I have personally reviewed the available labs and imaging studies at the time of the admission.  Pertinent labs:   Glucose 116 Platelets 17 UA: moderate Hgb, moderate LE, rare bacteria HCG negative  Assessment/Plan Principal Problem:   Idiopathic thrombocytopenic purpura (ITP) (HCC) Active Problems:   Obesity, Class III, BMI 40-49.9 (morbid obesity) (HCC)   Hyperglycemia   ITP -Patient with h/o recurrent and refractory ITP  -Presents today for headache and mild bruising and found to have recurrent ITP with platelets 17 -Patient without reported or apparent bleeding at this time -Head CT negative for bleeding (has some apparent chronic and stable calcifications of uncertain etiology) -Oncology was consulted -Dr. Mosetta PuttFeng has seen the patient and ordered Decadron 40 mg daily x 4 doses and NPlate -Will admit the patient to follow serial blood counts and ensure clinical stability prior to d/c; anticipate roughly 48-72 hours admission  Obesity -BMI is 46 -Suggest  outpatient bariatric medicine and/or surgery referral as an outpatient  Hyperglycemia -This is likely to worsen with steroids -Will check A1c -Her body habitus does put her at risk for DM -Needs outpatient f/u for this issue     Note: This patient has been tested and is negative for the novel coronavirus COVID-19.  DVT prophylaxis: None - consider starting SCDs as platelets improve Code Status:  Full  Family Communication:  None present; I attempted to call her daughter and was unable to reach her by telephone  Disposition Plan:  Home once clinically improved Consults called: Oncology  Admission status: Admit - It is my clinical opinion that admission to INPATIENT is reasonable and necessary because of the expectation that this patient will require hospital care that crosses at least 2 midnights to treat this condition based on the medical complexity of the problems presented.  Given the aforementioned information, the predictability of an adverse outcome is felt to be significant.    Jonah BlueJennifer Elysa Womac MD [facility]   How to contact the [facility] Attending or Consulting provider 7A - 7P or covering provider during after hours 7P -7A, for this patient?  1. Check the care team in [facility] and look for a) attending/consulting [facility] provider listed and b) the [facility] team listed 2. Log into [email or web address] and use [facility]'s universal password to access. If you do not have the password, please contact the hospital operator. 3. Locate the [facility] provider you are looking for under [facility] and page to a number that you can be directly reached. 4. If you still have difficulty reaching the provider, please page the [facility] (Director on Call) for the Hospitalists listed on amion for assistance.   10/14/2018, 4:32 PM

## 2018-10-14 NOTE — ED Provider Notes (Signed)
[facility] EMERGENCY DEPARTMENT Provider Note   CSN: [id number] Arrival date & time: 10/14/18  1052    History   Chief Complaint Chief Complaint  Patient presents with  . Bleeding/Bruising  . Headache    HPI [name] is a 43 y.o. female past medical history of anemia, chronic ITP, subarachnoid hemorrhage, retinal hemorrhage who presents for evaluation of headache, dizziness, bruising on bilateral lower extremities.  She states that she first noticed the bruising on her bilateral lower extremities last night.  She reports that she has several scattered areas of ecchymosis that are nonpainful.  She does not have any history of trauma or fall.  She does not have any areas of ecchymosis anywhere else.  She states that she is also been feeling very lightheaded/dizziness for the last week.  She endorses that this is a chronic issue that she experiences intermittently but feels like over the last week, it is been worse.  She describes it as a "feeling weak and like I am going to pass out."  She denies a room spinning sensation.  She does report one episode where she had a syncopal episode about a week ago but states that it only lasted for a few seconds.  No associated chest pain.  Patient also endorses a headache that has been ongoing for last week.  She states that headache started off mild and gradually worsened yesterday.  No preceding history of trauma, fall, injury.  Reports that she gets frequent headaches and states that this feels similar to her previous issues with headaches.  She describes it as a sharp pain in the posterior aspect of her head that sometimes sends small pricks to the front part of her head.  She has taken Tylenol for her symptoms with no improvement.  Patient denies any recent travel or known COVID-19 exposure.  She denies any fevers, vision changes, chest pain, difficulty breathing, numbness/weakness of her arms or legs, difficulty ambulating,  abdominal pain, nausea/vomiting, blood in stools.  She has an appointment with her hematologist on 10/22/2018.  Her LMP was about 1 week ago and she does report heavier bleeding.     The history is provided by the patient. A language interpreter was used.    Past Medical History:  Diagnosis Date  . Anemia   . Chronic ITP (idiopathic thrombocytopenia) (HCC)   . Depression   . Headache   . 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 Idiopathic thrombocytopenic purpura (ITP) (HCC) 10/14/2018  . 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 Chronic ITP (idiopathic thrombocytopenia) (HCC)   . 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 Menorrhagia with regular cycle   . Pressure ulcer 09/19/2015  . Gingival bleeding   . Chronic hepatitis B ([place]) 09/04/2015  . Subarachnoidal hemorrhage ([place]) 09/04/2015  . Severe thrombocytopenia ([place]) 09/04/2015  . Acute ITP ([place]) 08/28/2015    Past Surgical History:  Procedure Laterality Date  . BREAST SURGERY  biopsy  . SPLENECTOMY, TOTAL N/A 09/16/2015   Procedure: OPEN SPLENECTOMY;  Surgeon: Autumn Messing III, MD;  Location: [facility];  Service: General;  Laterality: N/A;     OB History    Gravida  6   Para  6   Term  6   Preterm  0   AB  0   Living  6     SAB  0   TAB  0   Ectopic  0   Multiple  0   Live Births  6            Home Medications    Prior to Admission medications   Medication Sig Start Date End Date Taking? Authorizing Provider  acetaminophen (TYLENOL) 500 MG tablet Take 1-2 tablets (500-1,000 mg total) by mouth every 6 (six) hours as needed for mild pain or moderate pain. Patient taking differently: Take 650 mg by mouth daily as needed for mild pain or moderate pain.  09/22/15  Yes Servando SnareBurns, Alexa R, MD    Family History History reviewed. No pertinent family history.  Social History Social History    Tobacco Use  . Smoking status: Never Smoker  . Smokeless tobacco: Never Used  Substance Use Topics  . Alcohol use: No    Alcohol/week: 0.0 standard drinks  . Drug use: No     Allergies   Patient has no known allergies.   Review of Systems Review of Systems  Constitutional: Negative for fever.  Eyes: Negative for visual disturbance.  Respiratory: Negative for cough and shortness of breath.   Cardiovascular: Negative for chest pain.  Gastrointestinal: Negative for abdominal pain, blood in stool, diarrhea, nausea and vomiting.  Genitourinary: Negative for dysuria and hematuria.  Musculoskeletal: Negative for gait problem and neck pain.  Skin: Negative for rash.  Neurological: Positive for dizziness, light-headedness and headaches. Negative for weakness and numbness.  Hematological: Bruises/bleeds easily.  All other systems reviewed and are negative.    Physical Exam Updated Vital Signs BP 115/72   Pulse 65   Temp 98.5 F (36.9 C) (Oral)   Resp 20   Wt 107.5 kg   LMP 10/10/2018   SpO2 97%   BMI 46.30 kg/m   Physical Exam Vitals signs and nursing note reviewed.  Constitutional:      Appearance: Normal appearance. She is well-developed.  HENT:     Head: Normocephalic and atraumatic.     Comments: No tenderness palpation noted to the skull.  No skull deformity or crepitus noted. Eyes:     General: Lids are normal.     Conjunctiva/sclera: Conjunctivae normal.     Pupils: Pupils are equal, round, and reactive to light.     Comments: PERRL. EOMs intact. No nystagmus, no neglect.   Neck:     Musculoskeletal: Full passive range of motion without pain and neck supple.     Comments: Full flexion/extension and lateral movement of neck fully intact. No bony midline tenderness. No deformities or crepitus.  Neck is supple without rigidity. Cardiovascular:     Rate and Rhythm: Normal rate and regular rhythm.     Pulses: Normal pulses.     Heart sounds: Normal heart sounds.  No murmur. No friction rub. No gallop.   Pulmonary:     Effort: Pulmonary effort is normal.     Breath sounds: Normal breath sounds.  Abdominal:     Palpations: Abdomen is soft. Abdomen is not rigid.     Tenderness: There is no abdominal tenderness. There is no guarding.     Comments: Abdomen is soft, non-distended, non-tender. No rigidity, No guarding. No peritoneal signs.  Healed surgical incision scar noted to left upper quadrant.  Abdomen is without any areas of ecchymosis.  Musculoskeletal: Normal range of motion.  Skin:    General: Skin is warm and dry.     Capillary Refill: Capillary refill takes less than 2 seconds.  Comments: Several small, scattered areas of ecchymosis no bigger than about 4 cm noted to the medial and anterior aspect of bilateral upper thighs.  Neurological:     Mental Status: She is alert and oriented to person, place, and time.     Comments: Cranial nerves III-XII intact Follows commands, Moves all extremities  5/5 strength to BUE and BLE  Sensation intact throughout all major nerve distributions Normal coordination No gait abnormalities  No slurred speech. No facial droop.   Psychiatric:        Speech: Speech normal.      ED Treatments / Results  Labs (all labs ordered are listed, but only abnormal results are displayed) Labs Reviewed  CBC WITH DIFFERENTIAL/PLATELET - Abnormal; Notable for the following components:      Result Value   Platelets 17 (*)    All other components within normal limits  COMPREHENSIVE METABOLIC PANEL - Abnormal; Notable for the following components:   Glucose, Bld 116 (*)    All other components within normal limits  URINALYSIS, ROUTINE W REFLEX MICROSCOPIC - Abnormal; Notable for the following components:   APPearance HAZY (*)    Hgb urine dipstick MODERATE (*)    Leukocytes,Ua MODERATE (*)    Bacteria, UA RARE (*)    All other components within normal limits  SARS CORONAVIRUS 2 (HOSPITAL ORDER, PERFORMED IN [facility])  HIV ANTIBODY (ROUTINE TESTING W REFLEX)  I-STAT BETA HCG BLOOD, ED ([facility], [facility], [facility] ONLY)    EKG None  Radiology Ct Head Wo Contrast  Result Date: 10/14/2018 CLINICAL DATA:  Left-sided headache for 1 week.  Hypertension. EXAM: CT HEAD WITHOUT CONTRAST TECHNIQUE: Contiguous axial images were obtained from the base of the skull through the vertex without intravenous contrast. COMPARISON:  March 16, 2018 and September 20, 2015 FINDINGS: Brain: The ventricles are normal in size and configuration. There is no evident mass. There is no demonstrable hemorrhage, extra-axial fluid collection, or midline shift. Foci of increased attenuation in each frontal-parietal lobe region, slightly more on the right than on the left, is stable compared to prior studies, likely representing focal calcification in these areas. There is no surrounding edema. Elsewhere brain parenchyma appears unremarkable. No evident acute infarct. Vascular: No hyperdense vessel. No appreciable vascular calcification evident. Skull: Bony calvarium appears intact. Sinuses/Orbits: There is a minimal retention cyst in anterior right ethmoid air cell. There is also minimal mucosal thickening in several ethmoid air cells. Other visualized paranasal sinuses are clear. Orbits appear symmetric bilaterally. Other: Mastoid air cells are clear. IMPRESSION: 1. Stable areas of increased attenuation in the frontal-parietal junction regions, slightly more on the right than on the left. The stability of this appearance is felt to be due to calcification as opposed to hemorrhage. Etiology for calcification in these areas is uncertain. Residua of old trauma is possible. No acute appearing hemorrhage evident at this time. No mass or edema. No acute infarct evident. 2.  Minimal ethmoid sinus disease. Electronically Signed   By: Bretta BangWilliam  Woodruff III M.D.   On: 10/14/2018 12:15    Procedures .Critical Care Performed by: Maxwell CaulLayden, Lindsey A, PA-C  Authorized by: Maxwell CaulLayden, Lindsey A, PA-C   Critical care provider statement:    Critical care time (minutes):  35   Critical care was necessary to treat or prevent imminent or life-threatening deterioration of the following conditions: Thrombocytopenia.   Critical care was time spent personally by me on the following activities:  Discussions with consultants, evaluation of patient's  response to treatment, examination of patient, ordering and performing treatments and interventions, ordering and review of laboratory studies, ordering and review of radiographic studies, pulse oximetry, re-evaluation of patient's condition, obtaining history from patient or surrogate and review of old charts   (including critical care time)  Medications Ordered in ED Medications  romiPLOStim (NPLATE) injection 2 mcg/kg (Dosing Weight) (has no administration in time range)  dexamethasone (DECADRON) tablet 40 mg (has no administration in time range)  acetaminophen (TYLENOL) tablet 650 mg (has no administration in time range)    Or  acetaminophen (TYLENOL) suppository 650 mg (has no administration in time range)  docusate sodium (COLACE) capsule 100 mg (has no administration in time range)  ondansetron (ZOFRAN) tablet 4 mg (has no administration in time range)    Or  ondansetron (ZOFRAN) injection 4 mg (has no administration in time range)  sodium chloride 0.9 % bolus 1,000 mL (1,000 mLs Intravenous New Bag/Given 10/14/18 1243)  metoCLOPramide (REGLAN) injection 10 mg (10 mg Intravenous Given 10/14/18 1243)  diphenhydrAMINE (BENADRYL) injection 25 mg (25 mg Intravenous Given 10/14/18 1242)  ondansetron (ZOFRAN) injection 4 mg (4 mg Intravenous Given 10/14/18 1242)     Initial Impression / Assessment and Plan / ED Course  I have reviewed the triage vital signs and the nursing notes.  Pertinent labs & imaging results that were available during my care of the patient were reviewed by me and considered in my medical  decision making (see chart for details).        43 year old female who presents for evaluation of bruising to bilateral lower extremities.  Also reports headache that is been ongoing for about a week as well as dizziness/lightheadedness.  History of chronic idiopathic thrombocytopenia.  Has a scheduled appointment with her hematologist in August.  No fevers, vision changes, numbness/weakness. Patient is afebrile, non-toxic appearing, sitting comfortably on examination table. Vital signs reviewed and stable.  No neuro deficits on exam.  She does have some areas of scattered ecchymosis noted to bilateral lower extremities.  Concern for thrombocytopenia given her history.  Additionally, she has a history of subarachnoid hemorrhage.  At this time, she has no evidence of neuro deficits on exam and her history is reassuring but given her history we will plan for imaging.  Additionally, will give migraine headache medication.  I-STAT beta negative.  UA shows some moderate hemoglobin, moderate leukocytes, small evidence of pyuria.  She is asymptomatic.  CBC shows no leukocytosis.  Platelets are 17.  Her most recent CBC that I can see is in February 2020 which showed a platelet count of 377.  CMP is unremarkable.  CT head shows stable areas of increased attenuation in the frontal parietal junction region that appears to be due to calcification rather than hemorrhage.  Given low platelets, will plan to consult hematology.  Discussed patient with Dr. Mosetta PuttFeng (Heme/Onc). Will start patient on Nplate and see patient in consultation.   Discussed patient with Dr. Ophelia CharterYates (hospitalist). Will plan for admission.   Portions of this note were generated with Scientist, clinical (histocompatibility and immunogenetics)Dragon dictation software. Dictation errors may occur despite best attempts at proofreading.   Final Clinical Impressions(s) / ED Diagnoses   Final diagnoses:  Thrombocytopenia [facility](HCC)    ED Discharge Orders    None       Rosana HoesLayden, Lindsey A, PA-C 10/14/18 1619     Tilden Fossaees, Elizabeth, MD 10/14/18 [id number]

## 2018-10-14 NOTE — ED Notes (Signed)
Patient transported to CT 

## 2018-10-14 NOTE — ED Notes (Signed)
New room assignment, nurse not available for report at this time.

## 2018-10-14 NOTE — ED Notes (Signed)
ED TO INPATIENT HANDOFF REPORT  ED Nurse Name and Phone #:  ginnie  S Name/Age/Gender [name] 43 y.o. female Room/Bed: 039C/039C  Code Status   Code Status: Full Code  Home/SNF/Other Home Patient oriented to: self, place, time and situation Is this baseline? Yes   Triage Complete: Triage complete  Chief Complaint BRUISING  Triage Note Pt. Stated, I have 2 bruise areas on my leg(lt) Ive had a headache for a week.   Allergies No Known Allergies  Level of Care/Admitting Diagnosis ED Disposition    ED Disposition Condition Comment   Admit  Hospital Area: [facility] [[id number]]  Level of Care: Med-Surg [16]  Covid Evaluation: Asymptomatic Screening Protocol (No Symptoms)  Diagnosis: Idiopathic thrombocytopenic purpura (ITP) (HCC) [161096]  Admitting Physician: Jonah Blue [[id number]]  Attending Physician: Jonah Blue [[id number]]  Estimated length of stay: past midnight tomorrow  Certification:: I certify this patient will need inpatient services for at least 2 midnights  PT Class (Do Not Modify): Inpatient [101]  PT Acc Code (Do Not Modify): Private [1]       B Medical/Surgery History Past Medical History:  Diagnosis Date  . Anemia   . Chronic ITP (idiopathic thrombocytopenia) (HCC)   . Depression   . Headache   . 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 BREAST SURGERY  biopsy  . 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   09/19/15    1600     1121          Intake/Output Last 24 hours  Intake/Output Summary (Last 24 hours) at 10/14/2018 1801 Last data filed at 10/14/2018 1722 Gross per 24 hour  Intake 1000 ml  Output -  Net 1000 ml    Labs/Imaging Results for orders placed or performed during the hospital encounter of 10/14/18 (from the past 48 hour(s))  CBC with Differential     Status: Abnormal   Collection Time: 10/14/18 11:30 AM  Result Value Ref Range   WBC 10.4 4.0 - 10.5 K/uL   RBC 4.33 3.87 - 5.11 MIL/uL   Hemoglobin 12.6 12.0 - 15.0 g/dL   HCT 04.5 40.9 - 81.1 %   MCV 88.9 80.0 - 100.0 fL   MCH 29.1 26.0 - 34.0 pg   MCHC 32.7 30.0 - 36.0 g/dL   RDW 91.4 78.2 - 95.6 %   Platelets 17 (LL) 150 - 400 K/uL    Comment: REPEATED TO VERIFY PLATELET COUNT CONFIRMED BY SMEAR Immature Platelet Fraction may be clinically indicated, consider ordering this additional test [id number] THIS CRITICAL RESULT HAS VERIFIED AND BEEN CALLED TO ASHLEY GORTON,RN BY ZELDA BEECH ON 07 27 2020 AT 1342, AND HAS BEEN READ BACK.     nRBC  0.2 0.0 - 0.2 %   Neutrophils Relative % 57 %   Neutro Abs 6.1 1.7 - 7.7 K/uL   Lymphocytes Relative 31 %   Lymphs Abs 3.2 0.7 - 4.0 K/uL   Monocytes Relative 9 %   Monocytes Absolute 0.9 0.1 - 1.0 K/uL   Eosinophils Relative 1 %   Eosinophils Absolute 0.2 0.0 - 0.5 K/uL   Basophils Relative 1 %   Basophils Absolute 0.1 0.0 - 0.1 K/uL   Immature Granulocytes 1 %   Abs Immature Granulocytes 0.07 0.00 - 0.07 K/uL   Carollee MassedHowell Jolly Bodies PRESENT     Comment: Performed at [facility], [address] [place], [place] [address]  Comprehensive metabolic panel     Status: Abnormal   Collection Time: 10/14/18 11:30 AM  Result Value Ref Range   Sodium 139 135 - 145 mmol/L   Potassium 3.7 3.5 - 5.1 mmol/L   Chloride 104 98 - 111  mmol/L   CO2 25 22 - 32 mmol/L   Glucose, Bld 116 (H) 70 - 99 mg/dL   BUN 10 6 - 20 mg/dL   Creatinine, Ser 9.140.66 0.44 - 1.00 mg/dL   Calcium 9.2 8.9 - 78.210.3 mg/dL   Total Protein 6.6 6.5 - 8.1 g/dL   Albumin 3.7 3.5 - 5.0 g/dL   AST 20 15 - 41 U/L   ALT 24 0 - 44 U/L   Alkaline Phosphatase 61 38 - 126 U/L   Total Bilirubin 0.4 0.3 - 1.2 mg/dL   GFR calc non Af Amer >60 >60 mL/min   GFR calc Af Amer >60 >60 mL/min   Anion gap 10 5 - 15    Comment: Performed at [facility], [address] [place], [place] [address]  Hemoglobin A1c     Status: Abnormal   Collection Time: 10/14/18 11:30 AM  Result Value Ref Range   Hgb A1c MFr Bld 6.0 (H) 4.8 - 5.6 %    Comment: (NOTE) Pre diabetes:          5.7%-6.4% Diabetes:              >6.4% Glycemic control for   <7.0% adults with diabetes    Mean Plasma Glucose 125.5 mg/dL    Comment: Performed at [facility], [address] [place], [place] [address]  Urinalysis, Routine w reflex microscopic     Status: Abnormal   Collection Time: 10/14/18 12:30 PM  Result Value Ref Range   Color, Urine YELLOW YELLOW   APPearance HAZY (A) CLEAR   Specific Gravity, Urine 1.020 1.005 - 1.030   pH 6.0 5.0 - 8.0   Glucose, UA NEGATIVE NEGATIVE mg/dL   Hgb urine dipstick MODERATE (A) NEGATIVE   Bilirubin Urine NEGATIVE NEGATIVE   Ketones, ur NEGATIVE NEGATIVE mg/dL   Protein, ur NEGATIVE NEGATIVE mg/dL   Nitrite NEGATIVE NEGATIVE   Leukocytes,Ua MODERATE (A) NEGATIVE   RBC / HPF 0-5 0 - 5 RBC/hpf   WBC, UA 6-10 0 - 5 WBC/hpf   Bacteria, UA RARE (A) NONE SEEN   Squamous Epithelial / LPF 0-5 0 - 5   Mucus PRESENT     Comment: Performed at [facility], [address] [place], [place] [address]  I-Stat beta hCG blood, ED     Status: None   Collection Time: 10/14/18 12:31 PM  Result Value Ref Range   I-stat hCG, quantitative <5.0 <5 mIU/mL   Comment 3  Comment:   GEST. AGE      CONC.  (mIU/mL)   <=1 WEEK        5 - 50      2 WEEKS       50 - 500     3 WEEKS       100 - 10,000     4 WEEKS     1,000 - 30,000        FEMALE AND NON-PREGNANT FEMALE:     LESS THAN 5 mIU/mL   SARS Coronavirus 2 (CEPHEID - Performed in [facility] hospital lab), Hosp Order     Status: None   Collection Time: 10/14/18  2:26 PM   Specimen: Nasopharyngeal Swab  Result Value Ref Range   SARS Coronavirus 2 NEGATIVE NEGATIVE    Comment: (NOTE) If result is NEGATIVE SARS-CoV-2 target nucleic acids are NOT DETECTED. The SARS-CoV-2 RNA is generally detectable in upper and lower  respiratory specimens during the acute phase of infection. The lowest  concentration of SARS-CoV-2 viral copies this assay can detect is 250  copies / mL. A negative result does not preclude SARS-CoV-2 infection  and should not be used as the sole basis for treatment or other  patient management decisions.  A negative result may occur with  improper specimen collection / handling, submission of specimen other  than nasopharyngeal swab, presence of viral mutation(s) within the  areas targeted by this assay, and inadequate number of viral copies  (<250 copies / mL). A negative result must be combined with clinical  observations, patient history, and epidemiological information. If result is POSITIVE SARS-CoV-2 target nucleic acids are DETECTED. The SARS-CoV-2 RNA is generally detectable in upper and lower  respiratory specimens dur ing the acute phase of infection.  Positive  results are indicative of active infection with SARS-CoV-2.  Clinical  correlation with patient history and other diagnostic information is  necessary to determine patient infection status.  Positive results do  not rule out bacterial infection or co-infection with other viruses. If result is PRESUMPTIVE POSTIVE SARS-CoV-2 nucleic acids MAY BE PRESENT.   A presumptive positive result was obtained on the submitted specimen  and confirmed on repeat testing.  While 2019 novel coronavirus   (SARS-CoV-2) nucleic acids may be present in the submitted sample  additional confirmatory testing may be necessary for epidemiological  and / or clinical management purposes  to differentiate between  SARS-CoV-2 and other Sarbecovirus currently known to infect humans.  If clinically indicated additional testing with an alternate test  methodology [phone]([id number]) is advised. The SARS-CoV-2 RNA is generally  detectable in upper and lower respiratory sp ecimens during the acute  phase of infection. The expected result is Negative. Fact Sheet for Patients:  BoilerBrush.com.cyhttps://www.fda.gov/media/136312/download Fact Sheet for Healthcare Providers: https://pope.com/https://www.fda.gov/media/136313/download This test is not yet approved or cleared by the [place] FDA and has been authorized for detection and/or diagnosis of SARS-CoV-2 by FDA under an Emergency Use Authorization (EUA).  This EUA will remain in effect (meaning this test can be used) for the duration of the COVID-19 declaration under Section 564(b)(1) of the Act, 21 U.S.C. section 360bbb-3(b)(1), unless the authorization is terminated or revoked sooner. Performed at [facility], [address] [place], [place] [address]    Ct Head Wo Contrast  Result Date: 10/14/2018 CLINICAL DATA:  Left-sided headache for 1 week.  Hypertension. EXAM: CT HEAD WITHOUT CONTRAST TECHNIQUE: Contiguous axial images were obtained from the base of the skull through the  vertex without intravenous contrast. COMPARISON:  March 16, 2018 and September 20, 2015 FINDINGS: Brain: The ventricles are normal in size and configuration. There is no evident mass. There is no demonstrable hemorrhage, extra-axial fluid collection, or midline shift. Foci of increased attenuation in each frontal-parietal lobe region, slightly more on the right than on the left, is stable compared to prior studies, likely representing focal calcification in these areas. There is no surrounding edema. Elsewhere  brain parenchyma appears unremarkable. No evident acute infarct. Vascular: No hyperdense vessel. No appreciable vascular calcification evident. Skull: Bony calvarium appears intact. Sinuses/Orbits: There is a minimal retention cyst in anterior right ethmoid air cell. There is also minimal mucosal thickening in several ethmoid air cells. Other visualized paranasal sinuses are clear. Orbits appear symmetric bilaterally. Other: Mastoid air cells are clear. IMPRESSION: 1. Stable areas of increased attenuation in the frontal-parietal junction regions, slightly more on the right than on the left. The stability of this appearance is felt to be due to calcification as opposed to hemorrhage. Etiology for calcification in these areas is uncertain. Residua of old trauma is possible. No acute appearing hemorrhage evident at this time. No mass or edema. No acute infarct evident. 2.  Minimal ethmoid sinus disease. Electronically Signed   By: Lowella Grip III M.D.   On: 10/14/2018 12:15    Pending Labs Unresulted Labs (From admission, onward)    Start     Ordered   10/15/18 6270  Basic metabolic panel  Tomorrow morning,   R     10/14/18 1555   10/15/18 0500  CBC  Daily,   R     10/14/18 1555   10/14/18 1555  HIV antibody (Routine Testing)  Once,   STAT     10/14/18 1555          Vitals/Pain Today's Vitals   10/14/18 1312 10/14/18 1352 10/14/18 1432 10/14/18 1742  BP:    (!) 87/48  Pulse:    60  Resp:    16  Temp:      TempSrc:      SpO2:      Weight:   107.5 kg   PainSc: 5  Asleep      Isolation Precautions No active isolations  Medications Medications  romiPLOStim (NPLATE) injection 2 mcg/kg (Dosing Weight) (has no administration in time range)  dexamethasone (DECADRON) tablet 40 mg (40 mg Oral Given 10/14/18 1737)  acetaminophen (TYLENOL) tablet 650 mg (has no administration in time range)    Or  acetaminophen (TYLENOL) suppository 650 mg (has no administration in time range)  docusate  sodium (COLACE) capsule 100 mg (has no administration in time range)  ondansetron (ZOFRAN) tablet 4 mg (has no administration in time range)    Or  ondansetron (ZOFRAN) injection 4 mg (has no administration in time range)  sodium chloride 0.9 % bolus 1,000 mL (0 mLs Intravenous Stopped 10/14/18 1722)  metoCLOPramide (REGLAN) injection 10 mg (10 mg Intravenous Given 10/14/18 1243)  diphenhydrAMINE (BENADRYL) injection 25 mg (25 mg Intravenous Given 10/14/18 1242)  ondansetron (ZOFRAN) injection 4 mg (4 mg Intravenous Given 10/14/18 1242)    Mobility walks Low fall risk   Focused Assessments Neuro Assessment Handoff:  Swallow screen pass? Yes          Neuro Assessment:   Neuro Checks:      Last Documented NIHSS Modified Score:   Has TPA been given? No If patient is a Neuro Trauma and patient is going to OR before floor call  report to 4N Charge nurse: [phone] or [phone]     R Recommendations: See Admitting Provider Note  Report given to:   Additional Notes:

## 2018-10-14 NOTE — Progress Notes (Signed)
[name]   DOB:03/24/1975   ZO#:[id number]#:[id number]   JWJ#:[id number]#:[id number]  Hematology follow up note   Subjective: Patient is well-known to me, has been treated for ITP, currently off treatment.  She presented to hospital for bruises, mild dizziness and headache.  She previously had a similar episodes twice with very low thrombocytopenia, responded to steroids, IVIG, and Nplate, last seen by me in the office in February 2020, blood count was normal, she was off treatment. Her plt is 17K today, no mucosal bleeding.  She noticed mild bruising on the left side and chest. CT head was negative for intracranial hemorrhage today.   Objective:  Vitals:   10/14/18 1101 10/14/18 1300  BP: (!) 119/53 115/72  Pulse: 65 65  Resp: 16 20  Temp: 98.5 F (36.9 C)   SpO2: 97% 97%    Body mass index is 46.3 kg/m. No intake or output data in the 24 hours ending 10/14/18 1657   Sclerae unicteric  A few small ecchymosis on her thighs   Lungs clear -- no rales or rhonchi  Heart regular rate and rhythm  Abdomen benign  MSK no focal spinal tenderness, no peripheral edema  Neuro nonfocal   CBG (last 3)  No results for input(s): GLUCAP in the last 72 hours.   Labs:  Urine Studies No results for input(s): UHGB, CRYS in the last 72 hours.  Invalid input(s): UACOL, UAPR, USPG, UPH, UTP, UGL, UKET, UBIL, UNIT, UROB, ULEU, UEPI, UWBC, URBC, UBAC, CAST, UCOM, BILUA  Basic Metabolic Panel: Recent Labs  Lab 10/14/18 1130  NA 139  K 3.7  CL 104  CO2 25  GLUCOSE 116*  BUN 10  CREATININE 0.66  CALCIUM 9.2   GFR Estimated Creatinine Clearance: 100.6 mL/min (by C-G formula based on SCr of 0.66 mg/dL). Liver Function Tests: Recent Labs  Lab 10/14/18 1130  AST 20  ALT 24  ALKPHOS 61  BILITOT 0.4  PROT 6.6  ALBUMIN 3.7   No results for input(s): LIPASE, AMYLASE in the last 168 hours. No results for input(s): AMMONIA in the last 168 hours. Coagulation profile No results for input(s): INR,  PROTIME in the last 168 hours.  CBC: Recent Labs  Lab 10/14/18 1130  WBC 10.4  NEUTROABS 6.1  HGB 12.6  HCT 38.5  MCV 88.9  PLT 17*   Cardiac Enzymes: No results for input(s): CKTOTAL, CKMB, CKMBINDEX, TROPONINI in the last 168 hours. BNP: Invalid input(s): POCBNP CBG: No results for input(s): GLUCAP in the last 168 hours. D-Dimer No results for input(s): DDIMER in the last 72 hours. Hgb A1c No results for input(s): HGBA1C in the last 72 hours. Lipid Profile No results for input(s): CHOL, HDL, LDLCALC, TRIG, CHOLHDL, LDLDIRECT in the last 72 hours. Thyroid function studies No results for input(s): TSH, T4TOTAL, T3FREE, THYROIDAB in the last 72 hours.  Invalid input(s): FREET3 Anemia work up No results for input(s): VITAMINB12, FOLATE, FERRITIN, TIBC, IRON, RETICCTPCT in the last 72 hours. Microbiology Recent Results (from the past 240 hour(s))  SARS Coronavirus 2 (CEPHEID - Performed in [facility] hospital lab), Hosp Order     Status: None   Collection Time: 10/14/18  2:26 PM   Specimen: Nasopharyngeal Swab  Result Value Ref Range Status   SARS Coronavirus 2 NEGATIVE NEGATIVE Final    Comment: (NOTE) If result is NEGATIVE SARS-CoV-2 target nucleic acids are NOT DETECTED. The SARS-CoV-2 RNA is generally detectable in upper and lower  respiratory specimens during the acute phase of infection. The  lowest  concentration of SARS-CoV-2 viral copies this assay can detect is 250  copies / mL. A negative result does not preclude SARS-CoV-2 infection  and should not be used as the sole basis for treatment or other  patient management decisions.  A negative result may occur with  improper specimen collection / handling, submission of specimen other  than nasopharyngeal swab, presence of viral mutation(s) within the  areas targeted by this assay, and inadequate number of viral copies  (<250 copies / mL). A negative result must be combined with clinical  observations, patient  history, and epidemiological information. If result is POSITIVE SARS-CoV-2 target nucleic acids are DETECTED. The SARS-CoV-2 RNA is generally detectable in upper and lower  respiratory specimens dur ing the acute phase of infection.  Positive  results are indicative of active infection with SARS-CoV-2.  Clinical  correlation with patient history and other diagnostic information is  necessary to determine patient infection status.  Positive results do  not rule out bacterial infection or co-infection with other viruses. If result is PRESUMPTIVE POSTIVE SARS-CoV-2 nucleic acids MAY BE PRESENT.   A presumptive positive result was obtained on the submitted specimen  and confirmed on repeat testing.  While 2019 novel coronavirus  (SARS-CoV-2) nucleic acids may be present in the submitted sample  additional confirmatory testing may be necessary for epidemiological  and / or clinical management purposes  to differentiate between  SARS-CoV-2 and other Sarbecovirus currently known to infect humans.  If clinically indicated additional testing with an alternate test  methodology [phone]) is advised. The SARS-CoV-2 RNA is generally  detectable in upper and lower respiratory sp ecimens during the acute  phase of infection. The expected result is Negative. Fact Sheet for Patients:  StrictlyIdeas.no Fact Sheet for Healthcare Providers: BankingDealers.co.za This test is not yet approved or cleared by the [place] FDA and has been authorized for detection and/or diagnosis of SARS-CoV-2 by FDA under an Emergency Use Authorization (EUA).  This EUA will remain in effect (meaning this test can be used) for the duration of the COVID-19 declaration under Section 564(b)(1) of the Act, 21 U.S.C. section 360bbb-3(b)(1), unless the authorization is terminated or revoked sooner. Performed at [facility], [place] [address] [place], [place] [address]        Studies:  Ct Head Wo Contrast  Result Date: 10/14/2018 CLINICAL DATA:  Left-sided headache for 1 week.  Hypertension. EXAM: CT HEAD WITHOUT CONTRAST TECHNIQUE: Contiguous axial images were obtained from the base of the skull through the vertex without intravenous contrast. COMPARISON:  March 16, 2018 and September 20, 2015 FINDINGS: Brain: The ventricles are normal in size and configuration. There is no evident mass. There is no demonstrable hemorrhage, extra-axial fluid collection, or midline shift. Foci of increased attenuation in each frontal-parietal lobe region, slightly more on the right than on the left, is stable compared to prior studies, likely representing focal calcification in these areas. There is no surrounding edema. Elsewhere brain parenchyma appears unremarkable. No evident acute infarct. Vascular: No hyperdense vessel. No appreciable vascular calcification evident. Skull: Bony calvarium appears intact. Sinuses/Orbits: There is a minimal retention cyst in anterior right ethmoid air cell. There is also minimal mucosal thickening in several ethmoid air cells. Other visualized paranasal sinuses are clear. Orbits appear symmetric bilaterally. Other: Mastoid air cells are clear. IMPRESSION: 1. Stable areas of increased attenuation in the frontal-parietal junction regions, slightly more on the right than on the left. The stability of this appearance is felt  to be due to calcification as opposed to hemorrhage. Etiology for calcification in these areas is uncertain. Residua of old trauma is possible. No acute appearing hemorrhage evident at this time. No mass or edema. No acute infarct evident. 2.  Minimal ethmoid sinus disease. Electronically Signed   By: Bretta BangWilliam  Woodruff III M.D.   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 ITP acute flare  2. History of intracranial hemorrhage, negative CT head today 3. Obesity    Plan:  -she  previously responded to steroids, IVIG, and Nplate. I will start her on dexa 40mg  daily for 4 days and Nplate 2mg /kg weekly today  -due to her recurrent ITP flare, I will start her on maintenance TPO such as oral promacta as outpt in a few weeks, she is agreeable  -will continue monitor her CBC daily -ED PA will give her 1u plt which I think is reasonable due to her previous history of intracranial hemorrhage. But given negative CT head today, I think it's also OK to hold plt transfusion for today.  -if her plt increase to >25-30K in 2 days, and she feels better, OK to discharge home. I do anticipate her plt will arise tomorrow after plt transfusion.  -will f/u tomorrow    Malachy MoodYan Dannetta Lekas, MD 10/14/2018  4:57 PM

## 2018-10-14 NOTE — Telephone Encounter (Signed)
Patient's daughter Meredith Pel calls stating that her mother has increased bruising all over her body and red marks, episodes of dizziness and passing out.  Advised her patient needs to proceed to ED for further evaluation. She verbalized an understanding.

## 2018-10-14 NOTE — ED Notes (Signed)
Dinner tray ordered.

## 2018-10-14 NOTE — ED Notes (Signed)
New room assignment has not been made yet.

## 2018-10-14 NOTE — ED Notes (Signed)
Pt reports hx of platelet transfusion one 2017 and one in 2019 and is worried that this might be causing her headache today.

## 2018-10-14 NOTE — ED Triage Notes (Signed)
Pt. Stated, I have 2 bruise areas on my leg(lt) Ive had a headache for a week.

## 2018-10-14 NOTE — ED Notes (Signed)
Attempted to call report to 6N, I was told there was not enough nurses available for the floor to accept another patient.  Spoke with Myriam Jacobson and was told he would call [facility] and let me know what or where the patient would be going.

## 2018-10-15 ENCOUNTER — Other Ambulatory Visit: Payer: Self-pay

## 2018-10-15 DIAGNOSIS — D693 Immune thrombocytopenic purpura: Principal | ICD-10-CM

## 2018-10-15 LAB — BASIC METABOLIC PANEL
Anion gap: 8 (ref 5–15)
BUN: 12 mg/dL (ref 6–20)
CO2: 23 mmol/L (ref 22–32)
Calcium: 9.4 mg/dL (ref 8.9–10.3)
Chloride: 105 mmol/L (ref 98–111)
Creatinine, Ser: 0.81 mg/dL (ref 0.44–1.00)
GFR calc Af Amer: >60 mL/min (ref >60)
GFR calc non Af Amer: >60 mL/min (ref >60)
Glucose, Bld: 223 mg/dL — ABNORMAL HIGH (ref 70–99)
Potassium: 4 mmol/L (ref 3.5–5.1)
Sodium: 136 mmol/L (ref 135–145)

## 2018-10-15 LAB — CBC
HCT: 39.2 % (ref 36.0–46.0)
Hemoglobin: 13.3 g/dL (ref 12.0–15.0)
MCH: 29.3 pg (ref 26.0–34.0)
MCHC: 33.9 g/dL (ref 30.0–36.0)
MCV: 86.3 fL (ref 80.0–100.0)
Platelets: 22 10*3/uL — CL (ref 150–400)
RBC: 4.54 MIL/uL (ref 3.87–5.11)
RDW: 13.8 % (ref 11.5–15.5)
WBC: 14.7 10*3/uL — ABNORMAL HIGH (ref 4.0–10.5)
nRBC: 0.1 % (ref 0.0–0.2)

## 2018-10-15 LAB — HIV ANTIBODY (ROUTINE TESTING W REFLEX): HIV Screen 4th Generation wRfx: NONREACTIVE

## 2018-10-15 MED ORDER — TRAMADOL HCL 50 MG PO TABS: 50.0000 mg | ORAL_TABLET | Freq: Four times a day (QID) | ORAL | Status: DC

## 2018-10-15 MED ORDER — TRAMADOL HCL 50 MG PO TABS
50.0000 mg | ORAL_TABLET | Freq: Four times a day (QID) | ORAL | Status: DC | PRN
  Administered 2018-10-15 – 2018-10-16 (×2): 50 mg via ORAL
  Filled 2018-10-15 (×2): qty 1

## 2018-10-15 MED ORDER — ZOLPIDEM TARTRATE 5 MG PO TABS
5.0000 mg | ORAL_TABLET | Freq: Every evening | ORAL | Status: DC | PRN
  Administered 2018-10-16: 5 mg via ORAL
  Filled 2018-10-15: qty 1

## 2018-10-15 NOTE — Progress Notes (Addendum)
[name]   DOB:Oct 02, 1975   GE#:[id number]   MWN#:[id number]  Hematology follow up note   Subjective: The patient still has ongoing intermittent dizziness.  This is unchanged.  Denies  bleeding.  Still has bruising to her legs but no new areas.  Received Nplate on 06/20/4740 and started dexamethasone 40 mg daily on 10/14/2018.  Objective:  Vitals:   10/15/18 0551 10/15/18 0957  BP: 108/64 91/74  Pulse: 94 63  Resp: 16   Temp: 98.2 F (36.8 C) 99.3 F (37.4 C)  SpO2: 96% 98%    Body mass index is 46.11 kg/m.  Intake/Output Summary (Last 24 hours) at 10/15/2018 1430 Last data filed at 10/14/2018 2211 Gross per 24 hour  Intake 1480 ml  Output -  Net 1480 ml     Sclerae unicteric  A few small ecchymosis on her thighs   Lungs clear -- no rales or rhonchi  Heart regular rate and rhythm  Abdomen benign  MSK no focal spinal tenderness, no peripheral edema  Neuro nonfocal   CBG (last 3)  No results for input(s): GLUCAP in the last 72 hours.   Labs:  Urine Studies No results for input(s): UHGB, CRYS in the last 72 hours.  Invalid input(s): UACOL, UAPR, USPG, UPH, UTP, UGL, UKET, UBIL, UNIT, UROB, ULEU, UEPI, UWBC, URBC, UBAC, CAST, UCOM, BILUA  Basic Metabolic Panel: Recent Labs  Lab 10/14/18 1130 10/15/18 0450  NA 139 136  K 3.7 4.0  CL 104 105  CO2 25 23  GLUCOSE 116* 223*  BUN 10 12  CREATININE 0.66 0.81  CALCIUM 9.2 9.4   GFR Estimated Creatinine Clearance: 99.1 mL/min (by C-G formula based on SCr of 0.81 mg/dL). Liver Function Tests: Recent Labs  Lab 10/14/18 1130  AST 20  ALT 24  ALKPHOS 61  BILITOT 0.4  PROT 6.6  ALBUMIN 3.7   No results for input(s): LIPASE, AMYLASE in the last 168 hours. No results for input(s): AMMONIA in the last 168 hours. Coagulation profile No results for input(s): INR, PROTIME in the last 168 hours.  CBC: Recent Labs  Lab 10/14/18 1130 10/15/18 0450  WBC 10.4 14.7*  NEUTROABS 6.1  --   HGB 12.6 13.3   HCT 38.5 39.2  MCV 88.9 86.3  PLT 17* 22*   Cardiac Enzymes: No results for input(s): CKTOTAL, CKMB, CKMBINDEX, TROPONINI in the last 168 hours. BNP: Invalid input(s): POCBNP CBG: No results for input(s): GLUCAP in the last 168 hours. D-Dimer No results for input(s): DDIMER in the last 72 hours. Hgb A1c Recent Labs    10/14/18 1130  HGBA1C 6.0*   Lipid Profile No results for input(s): CHOL, HDL, LDLCALC, TRIG, CHOLHDL, LDLDIRECT in the last 72 hours. Thyroid function studies No results for input(s): TSH, T4TOTAL, T3FREE, THYROIDAB in the last 72 hours.  Invalid input(s): FREET3 Anemia work up No results for input(s): VITAMINB12, FOLATE, FERRITIN, TIBC, IRON, RETICCTPCT in the last 72 hours. Microbiology Recent Results (from the past 240 hour(s))  SARS Coronavirus 2 (CEPHEID - Performed in [place] hospital lab), Hosp Order     Status: None   Collection Time: 10/14/18  2:26 PM   Specimen: Nasopharyngeal Swab  Result Value Ref Range Status   SARS Coronavirus 2 NEGATIVE NEGATIVE Final    Comment: (NOTE) If result is NEGATIVE SARS-CoV-2 target nucleic acids are NOT DETECTED. The SARS-CoV-2 RNA is generally detectable in upper and lower  respiratory specimens during the acute phase of infection. The lowest  concentration of SARS-CoV-2  viral copies this assay can detect is 250  copies / mL. A negative result does not preclude SARS-CoV-2 infection  and should not be used as the sole basis for treatment or other  patient management decisions.  A negative result may occur with  improper specimen collection / handling, submission of specimen other  than nasopharyngeal swab, presence of viral mutation(s) within the  areas targeted by this assay, and inadequate number of viral copies  (<250 copies / mL). A negative result must be combined with clinical  observations, patient history, and epidemiological information. If result is POSITIVE SARS-CoV-2 target nucleic acids are  DETECTED. The SARS-CoV-2 RNA is generally detectable in upper and lower  respiratory specimens dur ing the acute phase of infection.  Positive  results are indicative of active infection with SARS-CoV-2.  Clinical  correlation with patient history and other diagnostic information is  necessary to determine patient infection status.  Positive results do  not rule out bacterial infection or co-infection with other viruses. If result is PRESUMPTIVE POSTIVE SARS-CoV-2 nucleic acids MAY BE PRESENT.   A presumptive positive result was obtained on the submitted specimen  and confirmed on repeat testing.  While 2019 novel coronavirus  (SARS-CoV-2) nucleic acids may be present in the submitted sample  additional confirmatory testing may be necessary for epidemiological  and / or clinical management purposes  to differentiate between  SARS-CoV-2 and other Sarbecovirus currently known to infect humans.  If clinically indicated additional testing with an alternate test  methodology [phone] is advised. The SARS-CoV-2 RNA is generally  detectable in upper and lower respiratory sp ecimens during the acute  phase of infection. The expected result is Negative. Fact Sheet for Patients:  BoilerBrush.com.cyhttps://www.fda.gov/media/136312/download Fact Sheet for Healthcare Providers: https://pope.com/https://www.fda.gov/media/136313/download This test is not yet approved or cleared by the [place] FDA and has been authorized for detection and/or diagnosis of SARS-CoV-2 by FDA under an Emergency Use Authorization (EUA).  This EUA will remain in effect (meaning this test can be used) for the duration of the COVID-19 declaration under Section 564(b)(1) of the Act, 21 U.S.C. section 360bbb-3(b)(1), unless the authorization is terminated or revoked sooner. Performed at [facility], [address] [place], [place] [address]       Studies:  Ct Head Wo Contrast  Result Date: 10/14/2018 CLINICAL DATA:  Left-sided headache  for 1 week.  Hypertension. EXAM: CT HEAD WITHOUT CONTRAST TECHNIQUE: Contiguous axial images were obtained from the base of the skull through the vertex without intravenous contrast. COMPARISON:  March 16, 2018 and September 20, 2015 FINDINGS: Brain: The ventricles are normal in size and configuration. There is no evident mass. There is no demonstrable hemorrhage, extra-axial fluid collection, or midline shift. Foci of increased attenuation in each frontal-parietal lobe region, slightly more on the right than on the left, is stable compared to prior studies, likely representing focal calcification in these areas. There is no surrounding edema. Elsewhere brain parenchyma appears unremarkable. No evident acute infarct. Vascular: No hyperdense vessel. No appreciable vascular calcification evident. Skull: Bony calvarium appears intact. Sinuses/Orbits: There is a minimal retention cyst in anterior right ethmoid air cell. There is also minimal mucosal thickening in several ethmoid air cells. Other visualized paranasal sinuses are clear. Orbits appear symmetric bilaterally. Other: Mastoid air cells are clear. IMPRESSION: 1. Stable areas of increased attenuation in the frontal-parietal junction regions, slightly more on the right than on the left. The stability of this appearance is felt to be due to calcification  as opposed to hemorrhage. Etiology for calcification in these areas is uncertain. Residua of old trauma is possible. No acute appearing hemorrhage evident at this time. No mass or edema. No acute infarct evident. 2.  Minimal ethmoid sinus disease. Electronically Signed   By: Bretta BangWilliam  Woodruff III M.D.   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 ITP acute flare  2. History of intracranial hemorrhage, negative CT head 10/14/2018 3. Obesity    Plan:  -she previously responded to steroids, IVIG, and Nplate.  She was started on dexamethasone 40mg  daily for  4 days and Nplate 2mg /kg weekly on 10/14/2018 -Platelets have improved to 22,000 today.  No transfusion is indicated. -due to her recurrent ITP flare, I will start her on maintenance TPO such as oral promacta as outpt in a few weeks, she is agreeable  -will continue monitor her CBC daily -if her platelet count increases to greater than 25,000 tomorrow, and she feels better, OK to discharge home. -We will schedule outpatient follow-up for the patient at the cancer center next week.  Clenton PareKristin Curcio, NP 10/15/2018  2:30 PM   Addendum  I have seen the patient, examined her. I agree with the assessment and and plan and have edited the notes.   Pt is clinically stable, did not sleep well last night, probably related to steroids. Plt treanding up. No new bleedings. I ordered ambien as need for her insomnia. Probably can go home tomorrow and completed 4 days dexa on 7/30. I will see her next Monday 8/3.   Malachy MoodYan Kayleah Appleyard  10/15/2018

## 2018-10-15 NOTE — Progress Notes (Signed)
PROGRESS NOTE    [name]  ZOX:[id number] DOB: 12/01/1975 DOA: 10/14/2018 PCP: [name], [name], MD  Brief Narrative: [name] is a 43 y.o. 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    Presented to the ED 7/27 with headache similar to prior flares.  She noticed a headache for about a week and she noticed bruising on her legs. CT head unremarkable, no SAH.  Platelets 17, new (300 in February).  Hematology consulted, she was admitted and started on NPlate and steroids.  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 Mosetta PuttFeng   Procedures:   Antimicrobials:    Subjective: -Feels okay, complains of headache and mild dizziness, reports chronic intermittent vertigo  sensation with tinnitus  Objective: Vitals:   10/14/18 2035 10/15/18 0319 10/15/18 0551 10/15/18 0957  BP: 98/63  108/64 91/74  Pulse: 68  94 63  Resp: 18  16   Temp: 98.7 F (37.1 C) 98.7 F (37.1 C) 98.2 F (36.8 C) 99.3 F (37.4 C)  TempSrc: Oral  Oral Oral  SpO2: 99%  96% 98%  Weight: 107.1 kg       Intake/Output Summary (Last 24 hours) at 10/15/2018 1001 Last data filed at 10/14/2018 2211 Gross per 24 hour  Intake 1480 ml  Output -  Net 1480 ml   Filed Weights   10/14/18 1432 10/14/18 2035  Weight: 107.5 kg 107.1 kg    Examination:  General exam: Obese female, appears much older than stated age, AAO x3 appears calm and comfortable  Respiratory system: Clear bilaterally Cardiovascular system: S1 & S2 heard, RRR.  Gastrointestinal system: Abdomen is nondistended, soft and nontender.Normal bowel sounds heard. Central nervous system: Alert and oriented. No focal neurological deficits. Extremities: No edema Skin: Few ecchymosis on lower legs Psychiatry: Judgement and insight appear normal. Mood & affect appropriate.     Data Reviewed:   CBC: Recent Labs  Lab 10/14/18 1130 10/15/18 0450  WBC 10.4 14.7*  NEUTROABS 6.1  --   HGB 12.6 13.3  HCT 38.5 39.2  MCV 88.9 86.3  PLT 17* 22*   Basic Metabolic Panel: Recent Labs  Lab 10/14/18 1130 10/15/18 0450  NA 139 136  K 3.7 4.0  CL 104 105  CO2 25 23  GLUCOSE 116* 223*  BUN 10 12  CREATININE 0.66 0.81  CALCIUM 9.2 9.4   GFR: Estimated Creatinine Clearance: 99.1 mL/min (by C-G formula based on SCr of 0.81 mg/dL). Liver Function Tests: Recent Labs  Lab  10/14/18 1130  AST 20  ALT 24  ALKPHOS 61  BILITOT 0.4  PROT 6.6  ALBUMIN 3.7   No results for input(s): LIPASE, AMYLASE in the last 168 hours. No results for input(s): AMMONIA in the last 168 hours. Coagulation Profile: No results for input(s): INR, PROTIME in the last 168 hours. Cardiac Enzymes: No results for input(s): CKTOTAL, CKMB,  CKMBINDEX, TROPONINI in the last 168 hours. BNP (last 3 results) No results for input(s): PROBNP in the last 8760 hours. HbA1C: Recent Labs    10/14/18 1130  HGBA1C 6.0*   CBG: No results for input(s): GLUCAP in the last 168 hours. Lipid Profile: No results for input(s): CHOL, HDL, LDLCALC, TRIG, CHOLHDL, LDLDIRECT in the last 72 hours. Thyroid Function Tests: No results for input(s): TSH, T4TOTAL, FREET4, T3FREE, THYROIDAB in the last 72 hours. Anemia Panel: No results for input(s): VITAMINB12, FOLATE, FERRITIN, TIBC, IRON, RETICCTPCT in the last 72 hours. Urine analysis:    Component Value Date/Time   COLORURINE YELLOW 10/14/2018 1230   APPEARANCEUR HAZY (A) 10/14/2018 1230   LABSPEC 1.020 10/14/2018 1230   PHURINE 6.0 10/14/2018 1230   GLUCOSEU NEGATIVE 10/14/2018 1230   HGBUR MODERATE (A) 10/14/2018 1230   BILIRUBINUR NEGATIVE 10/14/2018 1230   BILIRUBINUR neg 10/12/2015 0852   KETONESUR NEGATIVE 10/14/2018 1230   PROTEINUR NEGATIVE 10/14/2018 1230   UROBILINOGEN 0.2 10/12/2015 0852   UROBILINOGEN 0.2 10/02/2011 1133   NITRITE NEGATIVE 10/14/2018 1230   LEUKOCYTESUR MODERATE (A) 10/14/2018 1230   Sepsis Labs: @LABRCNTIP (procalcitonin:4,lacticidven:4)  ) Recent Results (from the past 240 hour(s))  SARS Coronavirus 2 (CEPHEID - Performed in [facility]), Hosp Order     Status: None   Collection Time: 10/14/18  2:26 PM   Specimen: Nasopharyngeal Swab  Result Value Ref Range Status   SARS Coronavirus 2 NEGATIVE NEGATIVE Final    Comment: (NOTE) If result is NEGATIVE SARS-CoV-2 target nucleic acids are NOT DETECTED. The SARS-CoV-2 RNA is generally detectable in upper and lower  respiratory specimens during the acute phase of infection. The lowest  concentration of SARS-CoV-2 viral copies this assay can detect is 250  copies / mL. A negative result does not preclude SARS-CoV-2 infection  and should not be used as the sole basis for treatment or other   patient management decisions.  A negative result may occur with  improper specimen collection / handling, submission of specimen other  than nasopharyngeal swab, presence of viral mutation(s) within the  areas targeted by this assay, and inadequate number of viral copies  (<250 copies / mL). A negative result must be combined with clinical  observations, patient history, and epidemiological information. If result is POSITIVE SARS-CoV-2 target nucleic acids are DETECTED. The SARS-CoV-2 RNA is generally detectable in upper and lower  respiratory specimens dur ing the acute phase of infection.  Positive  results are indicative of active infection with SARS-CoV-2.  Clinical  correlation with patient history and other diagnostic information is  necessary to determine patient infection status.  Positive results do  not rule out bacterial infection or co-infection with other viruses. If result is PRESUMPTIVE POSTIVE SARS-CoV-2 nucleic acids MAY BE PRESENT.   A presumptive positive result was obtained on the submitted specimen  and confirmed on repeat testing.  While 2019 novel coronavirus  (SARS-CoV-2) nucleic acids may be present in the submitted sample  additional confirmatory testing may be necessary for epidemiological  and / or clinical management purposes  to differentiate between  SARS-CoV-2 and other  Sarbecovirus currently known to infect humans.  If clinically indicated additional testing with an alternate test  methodology [phone]) is advised. The SARS-CoV-2 RNA is generally  detectable in upper and lower respiratory sp ecimens during the acute  phase of infection. The expected result is Negative. Fact Sheet for Patients:  StrictlyIdeas.no Fact Sheet for Healthcare Providers: BankingDealers.co.za This test is not yet approved or cleared by the [place] FDA and has been authorized for detection and/or diagnosis of SARS-CoV-2 by  FDA under an Emergency Use Authorization (EUA).  This EUA will remain in effect (meaning this test can be used) for the duration of the COVID-19 declaration under Section 564(b)(1) of the Act, 21 U.S.C. section 360bbb-3(b)(1), unless the authorization is terminated or revoked sooner. Performed at [facility], [place] [address], [place], [place] [address]          Radiology Studies: Ct Head Wo Contrast  Result Date: 10/14/2018 CLINICAL DATA:  Left-sided headache for 1 week.  Hypertension. EXAM: CT HEAD WITHOUT CONTRAST TECHNIQUE: Contiguous axial images were obtained from the base of the skull through the vertex without intravenous contrast. COMPARISON:  March 16, 2018 and September 20, 2015 FINDINGS: Brain: The ventricles are normal in size and configuration. There is no evident mass. There is no demonstrable hemorrhage, extra-axial fluid collection, or midline shift. Foci of increased attenuation in each frontal-parietal lobe region, slightly more on the right than on the left, is stable compared to prior studies, likely representing focal calcification in these areas. There is no surrounding edema. Elsewhere brain parenchyma appears unremarkable. No evident acute infarct. Vascular: No hyperdense vessel. No appreciable vascular calcification evident. Skull: Bony calvarium appears intact. Sinuses/Orbits: There is a minimal retention cyst in anterior right ethmoid air cell. There is also minimal mucosal thickening in several ethmoid air cells. Other visualized paranasal sinuses are clear. Orbits appear symmetric bilaterally. Other: Mastoid air cells are clear. IMPRESSION: 1. Stable areas of increased attenuation in the frontal-parietal junction regions, slightly more on the right than on the left. The stability of this appearance is felt to be due to calcification as opposed to hemorrhage. Etiology for calcification in these areas is uncertain. Residua of old trauma is possible. No acute appearing  hemorrhage evident at this time. No mass or edema. No acute infarct evident. 2.  Minimal ethmoid sinus disease. Electronically Signed   By: Lowella Grip III M.D.   On: 10/14/2018 12:15        Scheduled Meds: . dexamethasone  40 mg Oral Daily  . docusate sodium  100 mg Oral BID   Continuous Infusions:   LOS: 1 day    Time spent: 50min    Domenic Polite, MD [facility]  10/15/2018, 10:01 AM

## 2018-10-15 NOTE — Plan of Care (Signed)
  Problem: Skin Integrity: Goal: Risk for impaired skin integrity will decrease Outcome: Progressing   Problem: Safety: Goal: Ability to remain free from injury will improve Outcome: Progressing   

## 2018-10-15 NOTE — Progress Notes (Signed)
Pt speaks mostly Spanish.  Used the [employer] interpreter, Gerald Stabs assisted with admission information. Pt with c/o HA, prn Tylenol given per MD order. Pharmacy called med ready for pick up, cant be tubed.  Pt oriented to room environment, no questions or concerns at this time, per Gerald Stabs the interpreter.   Pt resting, respirations even and unlabored, vss. Pt ambulating to BR with steady gait. Monitoring continued per MD orders and unit protocol.

## 2018-10-15 NOTE — Plan of Care (Signed)

## 2018-10-16 ENCOUNTER — Telehealth: Payer: Self-pay | Admitting: Pharmacist

## 2018-10-16 DIAGNOSIS — R7303 Prediabetes: Secondary | ICD-10-CM | POA: Diagnosis present

## 2018-10-16 DIAGNOSIS — D693 Immune thrombocytopenic purpura: Secondary | ICD-10-CM

## 2018-10-16 LAB — CBC
HCT: 37.4 % (ref 36.0–46.0)
Hemoglobin: 12.4 g/dL (ref 12.0–15.0)
MCH: 29.1 pg (ref 26.0–34.0)
MCHC: 33.2 g/dL (ref 30.0–36.0)
MCV: 87.8 fL (ref 80.0–100.0)
Platelets: 36 10*3/uL — ABNORMAL LOW (ref 150–400)
RBC: 4.26 MIL/uL (ref 3.87–5.11)
RDW: 14.4 % (ref 11.5–15.5)
WBC: 32.2 10*3/uL — ABNORMAL HIGH (ref 4.0–10.5)
nRBC: 0.1 % (ref 0.0–0.2)

## 2018-10-16 MED ORDER — DEXAMETHASONE 4 MG PO TABS: 40.0000 mg | ORAL_TABLET | Freq: Every day | ORAL | 0 refills | Status: AC

## 2018-10-16 MED ORDER — ELTROMBOPAG OLAMINE 25 MG PO TABS: 25.0000 mg | ORAL_TABLET | Freq: Every day | ORAL | 1 refills | Status: DC

## 2018-10-16 MED FILL — DEXAMETHASONE 4 MG TABLET: 4 | 1 days supply | Qty: 10 | Fill #0

## 2018-10-16 NOTE — Discharge Summary (Signed)
Physician Discharge Summary  [name] WGN:[id number] DOB: 02/14/1976 DOA: 10/14/2018  PCP: [name], [name], MD  Admit date: 10/14/2018 Discharge date: 10/16/2018  Time spent: 35 minutes  Recommendations for Outpatient Follow-up:  1. Hematology Dr. 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   Presented to the ED 7/27 with headache similar to prior flares. She noticed a headache for about a week and she noticed bruising on her legs.CT head unremarkable, no SAH. Platelets 17, new (300 in February).   Hospital Course:   Recurrent ITP -Previously treated with IVIG, Nplate and steroids -Given 1 dose of Nplate 2 days ago on admission and started on Decadron 40 mg daily -Hematology consulted, platelet count has improved from 17,000-36,000 today -CT head negative for any intracranial abnormalities -Given Decadron 40 mg today and a prescription for next dose tomorrow to complete 4 doses, follow-up on Monday with Dr. 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 -Recommended ENT follow-up, she may have Mnire's disease  Morbid obesity -BMI is 46, recommended weight loss and lifestyle modification  Borderline diabetes -Hemoglobin A1c is 6, CBGs higher in the setting of steroids currently, continue sliding scale insulin, again recommended weight loss and lifestyle modification to patient and daughter   Discharge Exam: Vitals:   10/16/18 0405 10/16/18 0738  BP: 109/62 (!) 87/48  Pulse: 75 77  Resp: 16   Temp: 98.4 F (36.9 C) 98.3 F (36.8 C)  SpO2: 95% 97%    General: AAO x3 Cardiovascular: S1-S2/regular rate rhythm Respiratory: Clear bilaterally  Discharge Instructions   Discharge Instructions    Diet Carb Modified   Complete by: As directed    Increase activity slowly   Complete by: As directed      Allergies as of 10/16/2018   No Known Allergies     Medication List    TAKE these medications   acetaminophen 500 MG tablet Commonly known as: TYLENOL Take 1-2 tablets (500-1,000 mg total) by mouth every 6 (six) hours as needed for mild pain or moderate pain. What changed:   how much to take  when to take this   dexamethasone 4 MG tablet Commonly known as: DECADRON Take 10 tablets (40 mg total) by mouth daily for 1 day. Start taking on: October 17, 2018      No Known Allergies Follow-up Information    [facility]. Schedule an appointment as soon as possible for a visit.   Contact information: [address] [place] [phone]           The results of significant diagnostics from this hospitalization (including imaging, microbiology, ancillary and laboratory) are  listed below for reference.    Significant Diagnostic Studies: Ct Head Wo Contrast  Result Date: 10/14/2018 CLINICAL DATA:  Left-sided headache for 1 week.  Hypertension. EXAM: CT HEAD WITHOUT CONTRAST TECHNIQUE: Contiguous axial images were obtained from the base of the skull  through the vertex without intravenous contrast. COMPARISON:  March 16, 2018 and September 20, 2015 FINDINGS: Brain: The ventricles are normal in size and configuration. There is no evident mass. There is no demonstrable hemorrhage, extra-axial fluid collection, or midline shift. Foci of increased attenuation in each frontal-parietal lobe region, slightly more on the right than on the left, is stable compared to prior studies, likely representing focal calcification in these areas. There is no surrounding edema. Elsewhere brain parenchyma appears unremarkable. No evident acute infarct. Vascular: No hyperdense vessel. No appreciable vascular calcification evident. Skull: Bony calvarium appears intact. Sinuses/Orbits: There is a minimal retention cyst in anterior right ethmoid air cell. There is also minimal mucosal thickening in several ethmoid air cells. Other visualized paranasal sinuses are clear. Orbits appear symmetric bilaterally. Other: Mastoid air cells are clear. IMPRESSION: 1. Stable areas of increased attenuation in the frontal-parietal junction regions, slightly more on the right than on the left. The stability of this appearance is felt to be due to calcification as opposed to hemorrhage. Etiology for calcification in these areas is uncertain. Residua of old trauma is possible. No acute appearing hemorrhage evident at this time. No mass or edema. No acute infarct evident. 2.  Minimal ethmoid sinus disease. Electronically Signed   By: Bretta BangWilliam  Woodruff III M.D.   On: 10/14/2018 12:15    Microbiology: Recent Results (from the past 240 hour(s))  SARS Coronavirus 2 (CEPHEID - Performed in [facility] hospital lab), Hosp Order     Status: None   Collection Time: 10/14/18  2:26 PM   Specimen: Nasopharyngeal Swab  Result Value Ref Range Status   SARS Coronavirus 2 NEGATIVE NEGATIVE Final    Comment: (NOTE) If result is NEGATIVE SARS-CoV-2 target nucleic acids are NOT DETECTED. The SARS-CoV-2 RNA is  generally detectable in upper and lower  respiratory specimens during the acute phase of infection. The lowest  concentration of SARS-CoV-2 viral copies this assay can detect is 250  copies / mL. A negative result does not preclude SARS-CoV-2 infection  and should not be used as the sole basis for treatment or other  patient management decisions.  A negative result may occur with  improper specimen collection / handling, submission of specimen other  than nasopharyngeal swab, presence of viral mutation(s) within the  areas targeted by this assay, and inadequate number of viral copies  (<250 copies / mL). A negative result must be combined with clinical  observations, patient history, and epidemiological information. If result is POSITIVE SARS-CoV-2 target nucleic acids are DETECTED. The SARS-CoV-2 RNA is generally detectable in upper and lower  respiratory specimens dur ing the acute phase of infection.  Positive  results are indicative of active infection with SARS-CoV-2.  Clinical  correlation with patient history and other diagnostic information is  necessary to determine patient infection status.  Positive results do  not rule out bacterial infection or co-infection with other viruses. If result is PRESUMPTIVE POSTIVE SARS-CoV-2 nucleic acids MAY BE PRESENT.   A presumptive positive result was obtained on the submitted specimen  and confirmed on repeat testing.  While 2019 novel coronavirus  (SARS-CoV-2) nucleic acids may be present in the submitted sample  additional confirmatory testing may be necessary for epidemiological  and / or clinical management purposes  to differentiate between  SARS-CoV-2 and other Sarbecovirus currently known to infect humans.  If clinically indicated additional testing with an alternate test  methodology [phone]) is advised. The SARS-CoV-2 RNA is generally  detectable in upper and lower respiratory sp ecimens during the acute  phase of  infection. The expected result is Negative. Fact Sheet for Patients:  StrictlyIdeas.no Fact Sheet for Healthcare Providers: BankingDealers.co.za This test is not yet approved or cleared by the [place] FDA and has been authorized for detection and/or diagnosis of SARS-CoV-2 by FDA under an Emergency Use Authorization (EUA).  This EUA will remain in effect (meaning this test can be used) for the duration of the COVID-19 declaration under Section 564(b)(1) of the Act, 21 U.S.C. section 360bbb-3(b)(1), unless the authorization is terminated or revoked sooner. Performed at [facility], [place] [address] [place], [place] [address]      Labs: Basic Metabolic Panel: Recent Labs  Lab 10/14/18 1130 10/15/18 0450  NA 139 136  K 3.7 4.0  CL 104 105  CO2 25 23  GLUCOSE 116* 223*  BUN 10 12  CREATININE 0.66 0.81  CALCIUM 9.2 9.4   Liver Function Tests: Recent Labs  Lab 10/14/18 1130  AST 20  ALT 24  ALKPHOS 61  BILITOT 0.4  PROT 6.6  ALBUMIN 3.7   No results for input(s): LIPASE, AMYLASE in the last 168 hours. No results for input(s): AMMONIA in the last 168 hours. CBC: Recent Labs  Lab 10/14/18 1130 10/15/18 0450 10/16/18 0401  WBC 10.4 14.7* 32.2*  NEUTROABS 6.1  --   --   HGB 12.6 13.3 12.4  HCT 38.5 39.2 37.4  MCV 88.9 86.3 87.8  PLT 17* 22* 36*   Cardiac Enzymes: No results for input(s): CKTOTAL, CKMB, CKMBINDEX, TROPONINI in the last 168 hours. BNP: BNP (last 3 results) No results for input(s): BNP in the last 8760 hours.  ProBNP (last 3 results) No results for input(s): PROBNP in the last 8760 hours.  CBG: No results for input(s): GLUCAP in the last 168 hours.     Signed:  Domenic Polite MD.  [facility] 10/16/2018, 2:37 PM

## 2018-10-16 NOTE — Telephone Encounter (Signed)
Oral Oncology Pharmacist Encounter  Received new prescription for Promacta (eltrobopag) for the treatment of chronic immune thrombocytopenia, previously treated, planned duration until adequate platelet response or unacceptable toxicity. Patient is currently admitted secondary to severe thrombocytopenia, increased bruising, and headache  She is s/p splenectomy and treated withsteroids, IVIG,romboplastin,and rituximab without adequate response.  Promacta is planned to be initiated at 25mg  by mouth once daily and then titrated to adequate response, max daily dose is 75mg  daily  Labs from Epic assessed, OK for treatment initiation.  Current medication list in Epic reviewed, no DDIs with Promacta identified.  Prescription has been e-scribed to the [facility] for benefits analysis and approval.  Oral Oncology Clinic will continue to follow for insurance authorization, copayment issues, initial counseling and start date.  Johny Drilling, PharmD, BCPS, BCOP  10/16/2018 11:39 AM Oral Oncology Clinic [phone]

## 2018-10-16 NOTE — Plan of Care (Signed)
  Problem: Activity: Goal: Risk for activity intolerance will decrease Outcome: Progressing   Problem: Coping: Goal: Level of anxiety will decrease Outcome: Progressing   Problem: Pain Managment: Goal: General experience of comfort will improve Outcome: Progressing   Problem: Safety: Goal: Ability to remain free from injury will improve Outcome: Progressing   Problem: Skin Integrity: Goal: Risk for impaired skin integrity will decrease Outcome: Progressing   

## 2018-10-16 NOTE — Progress Notes (Signed)
Discharge paperwork and instructions given to pt. Pt not in distress and tolerated well. 

## 2018-10-18 ENCOUNTER — Telehealth: Payer: Self-pay

## 2018-10-18 NOTE — Telephone Encounter (Signed)
Oral Oncology Patient Advocate Encounter  The patient is uninsured.   I can help the patient apply for patient assistance with the drug manufacturer, [employer] in an attempt to get the Promacta at $0 out of pocket cost to her.  I called the patients daughter, Meredith Pel to talk about this and had to leave a voicemail.  Oral Oncology clinic will continue to follow.  [place] Patient [place] Phone [phone] Fax [phone] 10/18/2018   10:40 AM

## 2018-10-21 ENCOUNTER — Inpatient Hospital Stay (HOSPITAL_BASED_OUTPATIENT_CLINIC_OR_DEPARTMENT_OTHER): Payer: Self-pay | Admitting: Hematology

## 2018-10-21 ENCOUNTER — Inpatient Hospital Stay: Payer: Self-pay | Admitting: Hematology

## 2018-10-21 ENCOUNTER — Inpatient Hospital Stay: Payer: Self-pay | Attending: Hematology

## 2018-10-21 ENCOUNTER — Inpatient Hospital Stay: Payer: Self-pay

## 2018-10-21 ENCOUNTER — Encounter: Payer: Self-pay | Admitting: Hematology

## 2018-10-21 ENCOUNTER — Other Ambulatory Visit: Payer: Self-pay

## 2018-10-21 VITALS — BP 97/56 | HR 81 | Temp 98.2°F | Resp 17 | Ht 60.0 in | Wt 236.7 lb

## 2018-10-21 DIAGNOSIS — I959 Hypotension, unspecified: Secondary | ICD-10-CM | POA: Insufficient documentation

## 2018-10-21 DIAGNOSIS — Z79899 Other long term (current) drug therapy: Secondary | ICD-10-CM | POA: Insufficient documentation

## 2018-10-21 DIAGNOSIS — R42 Dizziness and giddiness: Secondary | ICD-10-CM | POA: Insufficient documentation

## 2018-10-21 DIAGNOSIS — D693 Immune thrombocytopenic purpura: Secondary | ICD-10-CM

## 2018-10-21 DIAGNOSIS — K219 Gastro-esophageal reflux disease without esophagitis: Secondary | ICD-10-CM | POA: Insufficient documentation

## 2018-10-21 DIAGNOSIS — R5383 Other fatigue: Secondary | ICD-10-CM | POA: Insufficient documentation

## 2018-10-21 DIAGNOSIS — R51 Headache: Secondary | ICD-10-CM | POA: Insufficient documentation

## 2018-10-21 LAB — CBC WITH DIFFERENTIAL (CANCER CENTER ONLY)
Abs Immature Granulocytes: 0.43 10*3/uL — ABNORMAL HIGH (ref 0.00–0.07)
Basophils Absolute: 0 10*3/uL (ref 0.0–0.1)
Basophils Relative: 0 %
Eosinophils Absolute: 0.2 10*3/uL (ref 0.0–0.5)
Eosinophils Relative: 1 %
HCT: 41.7 % (ref 36.0–46.0)
Hemoglobin: 13.5 g/dL (ref 12.0–15.0)
Immature Granulocytes: 3 %
Lymphocytes Relative: 28 %
Lymphs Abs: 4.8 10*3/uL — ABNORMAL HIGH (ref 0.7–4.0)
MCH: 28.7 pg (ref 26.0–34.0)
MCHC: 32.4 g/dL (ref 30.0–36.0)
MCV: 88.7 fL (ref 80.0–100.0)
Monocytes Absolute: 0.7 10*3/uL (ref 0.1–1.0)
Monocytes Relative: 4 %
Neutro Abs: 11.1 10*3/uL — ABNORMAL HIGH (ref 1.7–7.7)
Neutrophils Relative %: 64 %
Platelet Count: 375 10*3/uL (ref 150–400)
RBC: 4.7 MIL/uL (ref 3.87–5.11)
RDW: 14.9 % (ref 11.5–15.5)
WBC Count: 17.2 10*3/uL — ABNORMAL HIGH (ref 4.0–10.5)
nRBC: 0.6 % — ABNORMAL HIGH (ref 0.0–0.2)

## 2018-10-21 MED ORDER — SODIUM CHLORIDE 0.9 % IV SOLN
INTRAVENOUS | Status: DC
  Administered 2018-10-21: 10:00:00 via INTRAVENOUS
  Filled 2018-10-21 (×2): qty 250

## 2018-10-21 NOTE — Telephone Encounter (Signed)
Oral Oncology Patient Advocate Encounter  I was able to speak to the patient today while she was in infusion with Almyra Free, the interpreters help.  I confirmed the patient is uninsured. I explained how patient assistance worked with [employer], this will be an attempt to get her Promacta at $0 out of pocket cost to her. The patient is in agreement with continuing with the application. She was able to provide the income information needed.  I submitted the patient portion of the application online. The doctors portion was completed and I faxed the application today, 8/3.  This encounter will be updated until final determination.  [place] Patient [place] Phone [phone] Fax [phone] 10/21/2018   11:54 AM

## 2018-10-21 NOTE — Patient Instructions (Signed)
Dehydration, Adult  Dehydration is a condition in which there is not enough fluid or water in the body. This happens when you lose more fluids than you take in. Important organs, such as the kidneys, brain, and heart, cannot function without a proper amount of fluids. Any loss of fluids from the body can lead to dehydration. Dehydration can range from mild to severe. This condition should be treated right away to prevent it from becoming severe. What are the causes? This condition may be caused by:  Vomiting.  Diarrhea.  Excessive sweating, such as from heat exposure or exercise.  Not drinking enough fluid, especially: ? When ill. ? While doing activity that requires a lot of energy.  Excessive urination.  Fever.  Infection.  Certain medicines, such as medicines that cause the body to lose excess fluid (diuretics).  Inability to access safe drinking water.  Reduced physical ability to get adequate water and food. What increases the risk? This condition is more likely to develop in people:  Who have a poorly controlled long-term (chronic) illness, such as diabetes, heart disease, or kidney disease.  Who are age 65 or older.  Who are disabled.  Who live in a place with high altitude.  Who play endurance sports. What are the signs or symptoms? Symptoms of mild dehydration may include:  Thirst.  Dry lips.  Slightly dry mouth.  Dry, warm skin.  Dizziness. Symptoms of moderate dehydration may include:  Very dry mouth.  Muscle cramps.  Dark urine. Urine may be the color of tea.  Decreased urine production.  Decreased tear production.  Heartbeat that is irregular or faster than normal (palpitations).  Headache.  Light-headedness, especially when you stand up from a sitting position.  Fainting (syncope). Symptoms of severe dehydration may include:  Changes in skin, such as: ? Cold and clammy skin. ? Blotchy (mottled) or pale skin. ? Skin that does  not quickly return to normal after being lightly pinched and released (poor skin turgor).  Changes in body fluids, such as: ? Extreme thirst. ? No tear production. ? Inability to sweat when body temperature is high, such as in hot weather. ? Very little urine production.  Changes in vital signs, such as: ? Weak pulse. ? Pulse that is more than 100 beats a minute when sitting still. ? Rapid breathing. ? Low blood pressure.  Other changes, such as: ? Sunken eyes. ? Cold hands and feet. ? Confusion. ? Lack of energy (lethargy). ? Difficulty waking up from sleep. ? Short-term weight loss. ? Unconsciousness. How is this diagnosed? This condition is diagnosed based on your symptoms and a physical exam. Blood and urine tests may be done to help confirm the diagnosis. How is this treated? Treatment for this condition depends on the severity. Mild or moderate dehydration can often be treated at home. Treatment should be started right away. Do not wait until dehydration becomes severe. Severe dehydration is an emergency and it needs to be treated in a hospital. Treatment for mild dehydration may include:  Drinking more fluids.  Replacing salts and minerals in your blood (electrolytes) that you may have lost. Treatment for moderate dehydration may include:  Drinking an oral rehydration solution (ORS). This is a drink that helps you replace fluids and electrolytes (rehydrate). It can be found at pharmacies and retail stores. Treatment for severe dehydration may include:  Receiving fluids through an IV tube.  Receiving an electrolyte solution through a feeding tube that is passed through your nose and   into your stomach (nasogastric tube, or NG tube).  Correcting any abnormalities in electrolytes.  Treating the underlying cause of dehydration. Follow these instructions at home:  If directed by your health care provider, drink an ORS: ? Make an ORS by following instructions on the  package. ? Start by drinking small amounts, about  cup (120 mL) every 5-10 minutes. ? Slowly increase how much you drink until you have taken the amount recommended by your health care provider.  Drink enough clear fluid to keep your urine clear or pale yellow. If you were told to drink an ORS, finish the ORS first, then start slowly drinking other clear fluids. Drink fluids such as: ? Water. Do not drink only water. Doing that can lead to having too little salt (sodium) in the body (hyponatremia). ? Ice chips. ? Fruit juice that you have added water to (diluted fruit juice). ? Low-calorie sports drinks.  Avoid: ? Alcohol. ? Drinks that contain a lot of sugar. These include high-calorie sports drinks, fruit juice that is not diluted, and soda. ? Caffeine. ? Foods that are greasy or contain a lot of fat or sugar.  Take over-the-counter and prescription medicines only as told by your health care provider.  Do not take sodium tablets. This can lead to having too much sodium in the body (hypernatremia).  Eat foods that contain a healthy balance of electrolytes, such as bananas, oranges, potatoes, tomatoes, and spinach.  Keep all follow-up visits as told by your health care provider. This is important. Contact a health care provider if:  You have abdominal pain that: ? Gets worse. ? Stays in one area (localizes).  You have a rash.  You have a stiff neck.  You are more irritable than usual.  You are sleepier or more difficult to wake up than usual.  You feel weak or dizzy.  You feel very thirsty.  You have urinated only a small amount of very dark urine over 6-8 hours. Get help right away if:  You have symptoms of severe dehydration.  You cannot drink fluids without vomiting.  Your symptoms get worse with treatment.  You have a fever.  You have a severe headache.  You have vomiting or diarrhea that: ? Gets worse. ? Does not go away.  You have blood or green matter  (bile) in your vomit.  You have blood in your stool. This may cause stool to look black and tarry.  You have not urinated in 6-8 hours.  You faint.  Your heart rate while sitting still is over 100 beats a minute.  You have trouble breathing. This information is not intended to replace advice given to you by your health care provider. Make sure you discuss any questions you have with your health care provider. Document Released: 03/06/2005 Document Revised: 02/16/2017 Document Reviewed: 04/30/2015 Elsevier Patient Education  2020 Elsevier Inc.  

## 2018-10-21 NOTE — Progress Notes (Signed)
Spoke with [place] about getting the patient in for PCP.  Per [place] at the practice patient needs to fill out a new patient packet and turn it back in before an appointment can be made.  This was communicated to the patient through our in house spanish interpreter Almyra Free.

## 2018-10-21 NOTE — Progress Notes (Signed)
[facility]   [phone] [phone]   Clinic Follow up Note   Patient Care Team: [name], [name], MD as PCP - General (General Practice) Levert FeinsteinGranfortuna, James M, MD as Consulting Physician (Oncology) Gardiner Coinswen, John (Hematology and Oncology) Elwin MochaShah, Christopher Tulip, MD as Consulting Physician (Ophthalmology) Justice DeedsMetjian, Ara D, MD (Internal Medicine)  Date of Service:  10/21/2018  CHIEF COMPLAINT: F/u of ITP  CURRENT THERAPY:  Promacta 25mg  daily starting 10/22/18   INTERVAL HISTORY:  [name] is here for a follow up post hospitalization of ITP. She presents to the clinic with her Spanish interpretor Amil AmenJulia. She notes since her discharge from hospital she has been dizzy and low BP low today. She also notes pain in back of head and head aches. She feels she is not able to function much at home and does not able to walk adequately. She notes her vision is blurred. She also feels tightness of her throat. She denies having a fall. She has a walker but has not used it    REVIEW OF SYSTEMS:   Constitutional: Denies fevers, chills or abnormal weight loss (+) Dizziness (+) HA (+) Significant fatigue  Eyes: (+) blurriness of vision Ears, nose, mouth, throat, and face: Denies mucositis or sore throat Respiratory: Denies cough, dyspnea or wheezes Cardiovascular: Denies palpitation, chest discomfort or lower extremity swelling Gastrointestinal:  Denies nausea, heartburn or change in bowel habits Skin: Denies abnormal skin rashes MSK: (+) posterior neck pain  Lymphatics: Denies new lymphadenopathy or easy bruising Neurological:Denies numbness, tingling or new weaknesses Behavioral/Psych: Mood is stable, no new changes  All other systems were reviewed with the patient and are negative.  MEDICAL HISTORY:  Past Medical History:  Diagnosis Date  . Anemia   . Chronic ITP (idiopathic thrombocytopenia) (HCC)   . Depression   . Headache   . 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 BREAST SURGERY  biopsy  . 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  ALLERGIES:  has No Known Allergies.  MEDICATIONS:  Current Outpatient Medications  Medication Sig Dispense Refill  . acetaminophen (TYLENOL) 500 MG tablet Take 1-2 tablets (500-1,000 mg total) by mouth every 6 (six) hours as needed for mild pain or moderate pain. (Patient taking differently: Take 650 mg by mouth daily as needed for mild pain or moderate pain. ) 30 tablet 0  . eltrombopag (PROMACTA) 25 MG tablet Take 1 tablet (25 mg total) by mouth daily. Take on an empty stomach, 1 hour before a meal or 2 hours after. 30 tablet 1   Current Facility-Administered Medications  Medication Dose Route Frequency Provider Last Rate Last Dose  . 0.9 %  sodium chloride infusion   Intravenous Continuous [name], Vaunda Gutterman, MD 500 mL/hr at 10/21/18 1004      PHYSICAL EXAMINATION: ECOG PERFORMANCE STATUS: 2 - Symptomatic, <50% confined to bed  Vitals:   10/21/18 0931 10/21/18 0932  BP: (!) 96/37 (!) 97/56  Pulse: 81   Resp: 17   Temp: 98.2 F (36.8 C)   SpO2: 100%    Filed Weights   10/21/18 0931  Weight: 236 lb 11.2 oz (107.4 kg)  GENERAL:alert, no distress and comfortable SKIN: skin color, texture, turgor are normal, no rashes or significant lesions EYES: normal, Conjunctiva are pink and non-injected, sclera clear OROPHARYNX:no exudate, no erythema and lips, buccal mucosa, and tongue normal  NECK: supple, thyroid normal size, non-tender, without nodularity LYMPH:  no palpable lymphadenopathy in the cervical, axillary  LUNGS: clear to auscultation and  percussion with normal breathing effort HEART: regular rate & rhythm and no murmurs and no lower extremity edema ABDOMEN:abdomen soft, non-tender and normal bowel sounds Musculoskeletal:no cyanosis of digits and no clubbing  NEURO: alert & oriented x 3 with fluent speech, no focal motor/sensory deficits  LABORATORY DATA:  I have reviewed the data as listed CBC Latest Ref Rng & Units 10/21/2018 10/16/2018 10/15/2018  WBC 4.0 - 10.5 K/uL 17.2(H) 32.2(H) 14.7(H)  Hemoglobin 12.0 - 15.0 g/dL 16.113.5 09.612.4 04.513.3  Hematocrit 36.0 - 46.0 % 41.7 37.4 39.2  Platelets 150 - 400 K/uL 375 36(L) 22(LL)     CMP Latest Ref Rng & Units 10/15/2018 10/14/2018 03/14/2018  Glucose 70 - 99 mg/dL 409(W223(H) 119(J116(H) 478(G186(H)  BUN 6 - 20 mg/dL 12 10 10   Creatinine 0.44 - 1.00 mg/dL 9.560.81 2.130.66 0.860.67  Sodium 135 - 145 mmol/L 136 139 134(L)  Potassium 3.5 - 5.1 mmol/L 4.0 3.7 4.1  Chloride 98 - 111 mmol/L 105 104 104  CO2 22 - 32 mmol/L 23 25 18(L)  Calcium 8.9 - 10.3 mg/dL 9.4 9.2 5.7(Q8.7(L)  Total Protein 6.5 - 8.1 g/dL - 6.6 -  Total Bilirubin 0.3 - 1.2 mg/dL - 0.4 -  Alkaline Phos 38 - 126 U/L - 61 -  AST 15 - 41 U/L - 20 -  ALT 0 - 44 U/L - 24 -      RADIOGRAPHIC STUDIES: I have personally reviewed the radiological images as listed and agreed with the findings in the report. No results found.   ASSESSMENT & PLAN:  [name] is a 43 y.o. female with   1. Recurrent ITP -She has a history of recurrent ITP. She is s/p splenectomy and treated withsteroid, IVIG,NPlate,Rituxanand chemoin the past (the first episode) dueto refractory disease -In 02/2018 she was hospitalized for smallsubarachnoid hemorrhageand severe thrombocytosisfrom ITP.She responded to dexamethasone, IVIG, and Nplate quickly in a few days.  -She was hospitalized again on 10/14/18 for acute ITP flare. She was treated with dexamethasone, and one dose NPlate. CT head was negative for intracranial hemorrhage. Platelets improved from  17K to 36K.  -Due to her recurrent ITP flare, I will start her on maintenance TPO such as oral Promacta. I reviewed side effects with her in great detail, especially risk of thrombosis. She is agreeable.  -She has no insurance coverage, I will try to get drug for free. Will give her sample bottle today to start at 25mg  daily  -Labs reviewed today, CBC WNL except WBC 17.2, ANC 11.1, lymph ct 4.8. PLT resolved to 375K.  -For her Acid reflux I recommend OTC antiacid like Prilosec or Pepcid. This can be related to her recent steroid treatment.  -f/u in 8 weeks    2. History of small foci ofsubarachnoid hemorrhage -Secondary to #1 -recovered well overall  -Will continue to monitor  3. Dizziness, lightheadedness, borderline hypotension  -she had similar symptoms before, intermittent  -She knows to walk slowly and try to avoid fall  -She still has dizziness and lightheadedness but her vision has improved.  -After latest hospital discharge her PLT normalized however she has had persistent HA, posterior neck  pain, dizziness and blurred vision. She has had some of theses symptoms before which would resolve with normalized PLT level. She does have a walker but has not used it.  -Physical exam including neuro exam was unremarkable today  -BP at 97/75 today (10/21/18). I highly recommend she see her PCP at [facility] as soon as possible. I also suggest she increase water intake and salt intake. I advised her to avoid fall  -Will given IV Fluids today  -If no improvement in 1-2 months I will have her see neurologist Dr. Mickeal Skinner.     PLAN: -IV Fluids today  -Start Promacta 25mg  daily tomorrow  -Lab in 2 and 4 weeks  -Lab and f/u in 8 weeks  -will let my nurse call the Family Medicine clinic to get her appointment to be seen there    No problem-specific Assessment & Plan notes found for this encounter.   No orders of the defined types were placed in this encounter.  All questions  were answered. The patient knows to call the clinic with any problems, questions or concerns. No barriers to learning was detected. I spent 20 minutes counseling the patient face to face. The total time spent in the appointment was 25 minutes and more than 50% was on counseling and review of test results     Truitt Merle, MD 10/21/2018   I, Joslyn Devon, am acting as scribe for Truitt Merle, MD.   I have reviewed the above documentation for accuracy and completeness, and I agree with the above.

## 2018-10-21 NOTE — Telephone Encounter (Signed)
Oral Oncology Patient Advocate Encounter  Received notification from [employer] Patient Assistance program that patient has been successfully enrolled into their program to receive Promacta from the manufacturer at $0 out of pocket until 10/21/2019.    I called and spoke with patients daughter, Meredith Pel  She knows we will have to re-apply.   Patient knows to call the office with questions or concerns.   Oral Oncology Clinic will continue to follow.  [place] Patient [place] Phone [phone] Fax [phone] 10/21/2018    2:44 PM

## 2018-10-22 ENCOUNTER — Telehealth: Payer: Self-pay | Admitting: Hematology

## 2018-10-22 NOTE — Telephone Encounter (Signed)
Scheduled appt per 8/3 los.  Left a voice message of appt date and time.  Printed and mailed appt calendar.

## 2018-10-22 NOTE — Telephone Encounter (Signed)
Oral Chemotherapy Pharmacist Encounter   Attempted to reach patient's daughter, Meredith Pel, to provide update and offer for initial counseling on her mom's oral medication: Promacta.  No answer.  Left voicemail for Perla to call back to discuss details of medication acquisition and initial counseling session.  Patient was provided written Spanish medication information and donated Promacta 50mg  tablets on 10/18/18. Patient has now been approved to receive Promacta through manufacturer compassionate use program. This will all be discussed with perla whe we are able to speak.  Johny Drilling, PharmD, BCPS, BCOP  10/22/2018   12:07 PM Oral Oncology Clinic [phone]

## 2018-10-24 NOTE — Telephone Encounter (Signed)
Oral Chemotherapy Pharmacist Encounter   I spoke with patient's daughter, Meredith Pel, for overview of: Promacta (eltrombopag) for the treatment of chronic immune thrombocytopenia, previously treated, planned duration until adequate platelet response or unacceptable toxicity.   Counseled on administration, dosing, side effects, monitoring, drug-food interactions, safe handling, storage, and disposal.  Patient will take Promacta 25mg  tablets, 1 tablet by mouth once daily on an empty stomach, 1 hour before or 2 hours after a meal.  Patient was given a donated bottle of Promacta 50mg  tablets while she was in the office on 10/21/18. She was also provided a pill splitter and the directions to take 1/2 tablet once daily on an empty stomach. She was also provided written medication information in Spanish.  Perla instructed to have her mom change to the 25mg  tablets as soon as they are available, and to save the 50mg  tablets incase her dose is increased in the future.  Perla counseled that her mom must administer Promacta at least 2 hours before, or 4 hours after antacids, foods high in calcium, or vitamin supplements (iron, calcium, aluminum, magnesium, selinium, zinc).  Promacta start date: 10/22/2018  Adverse effects include but are not limited to: fatigue, diarrhea, nausea, pruritis, decreased blood counts, hepatotoxicity, flu-like symptoms, myalgias, fever, and peripheral edema.    Reviewed importance of keeping a medication schedule and plan for any missed doses.  Medication reconciliation performed and medication/allergy list updated.  Patient does not have prescription insurance coverage. She has been successfully enrolled into manufacturer compassionate use program with [employer] to receive Promacta at $0 out of pocket cost from the manufacturer through 10/21/2019. Perla states they have not yet heard from patient assistance pharmacy to coordinate 1st shipment of Promacta 25mg  tablets.  Perla  informed they will likely need to keep up with ordering the Promacta refills from the patient assistance pharmacy, and refill should be requested 7-10 days prior to needing the next bottle to prevent a break in therapy.  All questions answered.  Perla voiced understanding and appreciation.   They know to call the office with questions or concerns.  Johny Drilling, PharmD, BCPS, BCOP  10/24/2018   10:40 AM Oral Oncology Clinic [phone]

## 2018-10-30 NOTE — Telephone Encounter (Signed)
Oral Oncology Patient Advocate Encounter  I called the patients daughter, Meredith Pel to see if they had heard from [employer] about shipping the [place]. I had to leave a voicemail.  Oral oncology clinic will continue to follow.  [place] Patient [place] Phone [phone] Fax [phone] 10/30/2018   12:54 PM

## 2018-11-04 ENCOUNTER — Inpatient Hospital Stay: Payer: Self-pay

## 2018-11-04 ENCOUNTER — Other Ambulatory Visit: Payer: Self-pay

## 2018-11-04 DIAGNOSIS — D693 Immune thrombocytopenic purpura: Secondary | ICD-10-CM

## 2018-11-04 LAB — CBC WITH DIFFERENTIAL (CANCER CENTER ONLY)
Abs Immature Granulocytes: 0.04 10*3/uL (ref 0.00–0.07)
Basophils Absolute: 0.1 10*3/uL (ref 0.0–0.1)
Basophils Relative: 1 %
Eosinophils Absolute: 0.2 10*3/uL (ref 0.0–0.5)
Eosinophils Relative: 2 %
HCT: 38.7 % (ref 36.0–46.0)
Hemoglobin: 12.6 g/dL (ref 12.0–15.0)
Immature Granulocytes: 1 %
Lymphocytes Relative: 40 %
Lymphs Abs: 3.3 10*3/uL (ref 0.7–4.0)
MCH: 28.3 pg (ref 26.0–34.0)
MCHC: 32.6 g/dL (ref 30.0–36.0)
MCV: 87 fL (ref 80.0–100.0)
Monocytes Absolute: 0.6 10*3/uL (ref 0.1–1.0)
Monocytes Relative: 8 %
Neutro Abs: 3.9 10*3/uL (ref 1.7–7.7)
Neutrophils Relative %: 48 %
Platelet Count: 239 10*3/uL (ref 150–400)
RBC: 4.45 MIL/uL (ref 3.87–5.11)
RDW: 14.3 % (ref 11.5–15.5)
WBC Count: 8.1 10*3/uL (ref 4.0–10.5)
nRBC: 0 % (ref 0.0–0.2)

## 2018-11-12 NOTE — Telephone Encounter (Signed)
Oral Oncology Patient Advocate Encounter  I spoke to [place] today and the patient received her Promacta from [employer] a week ago. The patient also had questions about her platelets and I offered to transfer her to the nurse, she declined stating that she would call the nurse later.  [place] Patient [place] Phone [phone] Fax [phone] 11/12/2018   11:31 AM

## 2018-11-12 NOTE — Telephone Encounter (Signed)
Spoke with Almyra Free in house spanish interpreter. She will call patient to let her know that her CBC was normal on 8/17.  She will let the patient know.

## 2018-11-12 NOTE — Telephone Encounter (Signed)
[name], could you call her (she speaks Spanish only) and let her know that her CBC was normal on 8/17, thanks   Truitt Merle MD

## 2018-11-16 ENCOUNTER — Emergency Department (HOSPITAL_COMMUNITY)
Admission: EM | Admit: 2018-11-16 | Discharge: 2018-11-16 | Disposition: A | Discharge status: Home / Self Care | Payer: Self-pay | Attending: Emergency Medicine | Admitting: Emergency Medicine

## 2018-11-16 ENCOUNTER — Emergency Department (HOSPITAL_COMMUNITY): Payer: Self-pay

## 2018-11-16 ENCOUNTER — Encounter (HOSPITAL_COMMUNITY): Payer: Self-pay

## 2018-11-16 ENCOUNTER — Other Ambulatory Visit: Payer: Self-pay

## 2018-11-16 DIAGNOSIS — Z79899 Other long term (current) drug therapy: Secondary | ICD-10-CM | POA: Insufficient documentation

## 2018-11-16 DIAGNOSIS — R071 Chest pain on breathing: Secondary | ICD-10-CM | POA: Insufficient documentation

## 2018-11-16 DIAGNOSIS — R0602 Shortness of breath: Secondary | ICD-10-CM | POA: Insufficient documentation

## 2018-11-16 LAB — COMPREHENSIVE METABOLIC PANEL
ALT: 20 U/L (ref 0–44)
AST: 23 U/L (ref 15–41)
Albumin: 3.6 g/dL (ref 3.5–5.0)
Alkaline Phosphatase: 52 U/L (ref 38–126)
Anion gap: 10 (ref 5–15)
BUN: 10 mg/dL (ref 6–20)
CO2: 25 mmol/L (ref 22–32)
Calcium: 9.1 mg/dL (ref 8.9–10.3)
Chloride: 103 mmol/L (ref 98–111)
Creatinine, Ser: 0.55 mg/dL (ref 0.44–1.00)
GFR calc Af Amer: >60 mL/min (ref >60)
GFR calc non Af Amer: >60 mL/min (ref >60)
Glucose, Bld: 106 mg/dL — ABNORMAL HIGH (ref 70–99)
Potassium: 3.7 mmol/L (ref 3.5–5.1)
Sodium: 138 mmol/L (ref 135–145)
Total Bilirubin: 0.6 mg/dL (ref 0.3–1.2)
Total Protein: 6.4 g/dL — ABNORMAL LOW (ref 6.5–8.1)

## 2018-11-16 LAB — CBC WITH DIFFERENTIAL/PLATELET
Abs Immature Granulocytes: 0.11 10*3/uL — ABNORMAL HIGH (ref 0.00–0.07)
Basophils Absolute: 0.1 10*3/uL (ref 0.0–0.1)
Basophils Relative: 0 %
Eosinophils Absolute: 0.2 10*3/uL (ref 0.0–0.5)
Eosinophils Relative: 1 %
HCT: 37.1 % (ref 36.0–46.0)
Hemoglobin: 11.9 g/dL — ABNORMAL LOW (ref 12.0–15.0)
Immature Granulocytes: 1 %
Lymphocytes Relative: 24 %
Lymphs Abs: 3.6 10*3/uL (ref 0.7–4.0)
MCH: 29.2 pg (ref 26.0–34.0)
MCHC: 32.1 g/dL (ref 30.0–36.0)
MCV: 91.2 fL (ref 80.0–100.0)
Monocytes Absolute: 1.6 10*3/uL — ABNORMAL HIGH (ref 0.1–1.0)
Monocytes Relative: 11 %
Neutro Abs: 9.1 10*3/uL — ABNORMAL HIGH (ref 1.7–7.7)
Neutrophils Relative %: 63 %
Platelets: 313 10*3/uL (ref 150–400)
RBC: 4.07 MIL/uL (ref 3.87–5.11)
RDW: 14.6 % (ref 11.5–15.5)
WBC: 14.5 10*3/uL — ABNORMAL HIGH (ref 4.0–10.5)
nRBC: 0 % (ref 0.0–0.2)

## 2018-11-16 LAB — TROPONIN I (HIGH SENSITIVITY)
Troponin I (High Sensitivity): 3 ng/L (ref <18)
Troponin I (High Sensitivity): 3 ng/L (ref <18)

## 2018-11-16 LAB — URINALYSIS, ROUTINE W REFLEX MICROSCOPIC
Bilirubin Urine: NEGATIVE
Glucose, UA: NEGATIVE mg/dL
Hgb urine dipstick: NEGATIVE
Ketones, ur: NEGATIVE mg/dL
Nitrite: NEGATIVE
Protein, ur: NEGATIVE mg/dL
Specific Gravity, Urine: 1.02 (ref 1.005–1.030)
pH: 6 (ref 5.0–8.0)

## 2018-11-16 LAB — D-DIMER, QUANTITATIVE: D-Dimer, Quant: 0.39 ug/mL-FEU (ref 0.00–0.50)

## 2018-11-16 LAB — LACTIC ACID, PLASMA: Lactic Acid, Venous: 0.8 mmol/L (ref 0.5–1.9)

## 2018-11-16 LAB — LIPASE, BLOOD: Lipase: 42 U/L (ref 11–51)

## 2018-11-16 LAB — POC URINE PREG, ED: Preg Test, Ur: NEGATIVE

## 2018-11-16 MED ORDER — OXYCODONE-ACETAMINOPHEN 5-325 MG PO TABS: 1.0000 | ORAL_TABLET | ORAL | 0 refills | Status: DC | PRN

## 2018-11-16 MED ORDER — MORPHINE SULFATE (PF) 4 MG/ML IV SOLN
4.0000 mg | Freq: Once | INTRAVENOUS | Status: AC
  Administered 2018-11-16: 09:00:00 4 mg via INTRAVENOUS
  Filled 2018-11-16: qty 1

## 2018-11-16 NOTE — ED Triage Notes (Signed)
Through interpretor, Pt c/o SHOB and back pain with respiration. Statyes pain started yesterday at 2 pm then worse through night . Daughter at bedside

## 2018-11-16 NOTE — ED Notes (Signed)
Pt dc instructions discussed through interpretor with daughter at bedside. All questions answered and pt verbalizes understanding. Home stble via wc with daughter

## 2018-11-16 NOTE — ED Notes (Signed)
BP cuff noted as mal position. Repositioned for recheck.

## 2018-11-16 NOTE — ED Notes (Signed)
ED Provider at bedside. 

## 2018-11-16 NOTE — Discharge Instructions (Signed)
Your work-up today was overall reassuring.  Your platelets have improved and are within a normal range.  Your cardiac enzymes were negative both times we checked them.  Your d-dimer test was negative.  Your chest x-ray was unremarkable with no abnormalities and your other labs were also reassuring.  We suspect you are having a chest wall type pain and as the pain improved with the pain medication, we feel you are safe to get a prescription for some pain medicine to see if this helps.  Please however follow-up with your primary doctor in the next several days and if any symptoms change or worsen or do not improve, is return to the nearest emergency department immediately.

## 2018-11-16 NOTE — ED Provider Notes (Signed)
[facility] EMERGENCY DEPARTMENT Provider Note   CSN: [id number] Arrival date & time: 11/16/18  3295     History   Chief Complaint Chief Complaint  Patient presents with   Chest Pain   Back Pain    HPI [name] is a 43 y.o. female.     The history is provided by the patient, medical records and a friend. The history is limited by a language barrier. A language interpreter was used.  Chest Pain Pain location:  R chest and R lateral chest Pain quality: sharp   Pain radiates to:  Upper back (r lateral back) Pain severity:  Severe Onset quality:  Gradual Duration:  2 days Timing:  Constant Progression:  Worsening Chronicity:  New Context: breathing   Relieved by:  Nothing Worsened by:  Deep breathing Ineffective treatments:  None tried Associated symptoms: back pain and shortness of breath   Associated symptoms: no abdominal pain, no cough, no diaphoresis, no dizziness, no fatigue, no fever, no headache, no lower extremity edema, no nausea, no near-syncope, no palpitations, no vomiting and no weakness     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    Patient Active Problem List   Diagnosis Date Noted   Borderline diabetes 10/16/2018   Idiopathic thrombocytopenic purpura (ITP) (HCC) 10/14/2018   Obesity, Class III, BMI 40-49.9 (morbid obesity) ([place]) 10/14/2018   Hyperglycemia 10/14/2018   Retinal hemorrhage of both eyes 10/05/2015   Chronic ITP (idiopathic thrombocytopenia) (HCC)    Nontraumatic intracerebral hemorrhage (HCC)    Menorrhagia with regular cycle    Pressure ulcer 09/19/2015   Gingival bleeding    Chronic hepatitis B ([place]) 09/04/2015   Subarachnoidal hemorrhage ([place]) 09/04/2015   Severe thrombocytopenia ([facility]) 09/04/2015    Acute ITP ([place]) 08/28/2015    Past Surgical History:  Procedure Laterality Date   BREAST SURGERY  biopsy   SPLENECTOMY, 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 Authorizing Provider  acetaminophen (TYLENOL) 500 MG tablet Take 1-2 tablets (500-1,000 mg total) by mouth every 6 (six) hours as needed for mild pain or moderate pain. Patient taking differently: Take 650 mg by mouth daily as needed for mild pain or moderate pain.  09/22/15   Burns, Arloa Koh, MD  eltrombopag (PROMACTA) 25 MG tablet Take 1 tablet (25 mg total) by mouth daily. Take on an empty stomach, 1 hour before a meal or 2 hours after. 10/16/18   Truitt Merle, MD    Family History No family history on file.  Social History Social History   Tobacco Use   Smoking status: Never Smoker   Smokeless tobacco: Never Used  Substance Use  Topics   Alcohol use: No    Alcohol/week: 0.0 standard drinks   Drug use: No     Allergies   Patient has no known allergies.   Review of Systems Review of Systems  Constitutional: Negative for chills, diaphoresis, fatigue and fever.  HENT: Negative for congestion.   Eyes: Negative for visual disturbance.  Respiratory: Positive for shortness of breath. Negative for cough, chest tightness, wheezing and stridor.   Cardiovascular: Positive for chest pain. Negative for palpitations, leg swelling and near-syncope.  Gastrointestinal: Positive for diarrhea (chronic). Negative for abdominal pain, constipation, nausea and vomiting.  Genitourinary: Negative for dysuria, flank pain and frequency.  Musculoskeletal: Positive for  back pain. Negative for neck pain and neck stiffness.  Skin: Negative for rash and wound.  Neurological: Negative for dizziness, weakness, light-headedness and headaches.  Psychiatric/Behavioral: Negative for agitation.  All other systems reviewed and are negative.    Physical Exam Updated Vital Signs There were no vitals taken for this visit.  Physical Exam Vitals signs and nursing note reviewed.  Constitutional:      General: She is not in acute distress.    Appearance: She is well-developed. She is obese. She is not ill-appearing, toxic-appearing or diaphoretic.  HENT:     Head: Normocephalic and atraumatic.     Right Ear: External ear normal.     Left Ear: External ear normal.     Nose: Nose normal.     Mouth/Throat:     Pharynx: No oropharyngeal exudate.  Eyes:     Conjunctiva/sclera: Conjunctivae normal.     Pupils: Pupils are equal, round, and reactive to light.  Neck:     Musculoskeletal: Normal range of motion and neck supple.  Cardiovascular:     Rate and Rhythm: Normal rate and regular rhythm.     Heart sounds: Normal heart sounds. No murmur. No systolic murmur. No diastolic murmur.  Pulmonary:     Effort: Pulmonary effort is normal. No tachypnea or respiratory distress.     Breath sounds: No stridor. Decreased breath sounds present. No wheezing, rhonchi or rales.  Abdominal:     General: Bowel sounds are normal. There is no distension.     Palpations: Abdomen is soft.     Tenderness: There is no abdominal tenderness. There is no rebound.  Musculoskeletal:     Right lower leg: She exhibits no tenderness. No edema.     Left lower leg: She exhibits no tenderness. No edema.  Skin:    General: Skin is warm.     Capillary Refill: Capillary refill takes less than 2 seconds.     Coloration: Skin is not pale.     Findings: No erythema or rash.  Neurological:     General: No focal deficit present.     Mental Status: She is alert.     Motor: No weakness or abnormal  muscle tone.     Coordination: Coordination normal.     Deep Tendon Reflexes: Reflexes are normal and symmetric.  Psychiatric:        Mood and Affect: Mood normal.      ED Treatments / Results  Labs (all labs ordered are listed, but only abnormal results are displayed) Labs Reviewed  CBC WITH DIFFERENTIAL/PLATELET - Abnormal; Notable for the following components:      Result Value   WBC 14.5 (*)    Hemoglobin 11.9 (*)    Neutro Abs 9.1 (*)    Monocytes Absolute 1.6 (*)    Abs Immature  Granulocytes 0.11 (*)    All other components within normal limits  COMPREHENSIVE METABOLIC PANEL - Abnormal; Notable for the following components:   Glucose, Bld 106 (*)    Total Protein 6.4 (*)    All other components within normal limits  URINALYSIS, ROUTINE W REFLEX MICROSCOPIC - Abnormal; Notable for the following components:   Leukocytes,Ua SMALL (*)    Bacteria, UA RARE (*)    All other components within normal limits  URINE CULTURE  LIPASE, BLOOD  LACTIC ACID, PLASMA  D-DIMER, QUANTITATIVE (NOT AT [facility])  POC URINE PREG, ED  TROPONIN I (HIGH SENSITIVITY)  TROPONIN I (HIGH SENSITIVITY)    EKG EKG Interpretation  Date/Time:  Saturday November 16 2018 08:19:37 EDT Ventricular Rate:  73 PR Interval:    QRS Duration: 98 QT Interval:  402 QTC Calculation: 443 R Axis:   54 Text Interpretation:  Sinus rhythm When compared to ECG in DEC 2019, unchanged T wave inversion in lead 3.  No STEMI Confirmed by Theda Belfast ([id number]) on 11/16/2018 8:49:35 AM   Radiology Dg Chest 2 View  Result Date: 11/16/2018 CLINICAL DATA:  Right chest and back pain. EXAM: CHEST - 2 VIEW COMPARISON:  03/16/2018 FINDINGS: Left upper quadrant surgical clips. Midline trachea. Borderline cardiomegaly. Mediastinal contours otherwise within normal limits. No pleural effusion or pneumothorax. Low lung volumes with resultant pulmonary interstitial prominence. No lobar consolidation. IMPRESSION: No acute cardiopulmonary  disease. Electronically Signed   By: Jeronimo Greaves M.D.   On: 11/16/2018 09:14    Procedures Procedures (including critical care time)  Medications Ordered in ED Medications  morphine 4 MG/ML injection 4 mg (4 mg Intravenous Given 11/16/18 0831)     Initial Impression / Assessment and Plan / ED Course  I have reviewed the triage vital signs and the nursing notes.  Pertinent labs & imaging results that were available during my care of the patient were reviewed by me and considered in my medical decision making (see chart for details).        [name] is a 43 y.o. female with a past medical history significant for hepatitis B, ITP, prior subarachnoid hemorrhage, obesity, borderline diabetes, and recent admission for ITP who presents with severe right-sided chest pain rating towards her back.  She reports that she has never had this pain before and it started yesterday with sharp pains in her right lateral and right anterior chest.  She reports that it radiates around the side towards her back.  She denies any rashes suggestive of shingles.  She describes the pain as a 10 out of 10 is extremely pleuritic.  She is feeling short of breath with it.  She reports he cannot catch her breath.  She denies any midline back pain but the pain is more to the right side of her back.  She denies any fevers, chills, congestion, or cough.  She reports that the pain worsened acutely at around 3 AM.  She has no history of DVT or PE.  She reports no significant diaphoresis, nausea, vomiting, urinary, constipation, or other symptoms.  She reports chronic diarrhea which is unchanged.  She denies leg pain or leg swelling.  On exam, patient has no tenderness externally.  No rash seen.  Patient's breath sounds were slightly diminished on the right side compared to the left.  No murmur appreciated.  Abdomen nontender.  Good pulses in all extremities.  Normal sensation strength in legs.  Patient having  normal oxygen saturations on arrival but clearly  having severe pain with deep breathing.  Clinically I am unsure the etiology of her symptoms initially.  Given her history of ITP, I have a low suspicion for a thromboembolic cause of her symptoms however, with the extreme pleuritic unilateral chest pain with shortness of breath, I do feel she needs to be ruled out for pulmonary ballismus with a d-dimer.  Also considering a spontaneous hemothorax given her recent spontaneous bleeding.  We will also get troponin and screening labs as well as a chest x-ray.  If work-up returns reassuring, symptoms may be related to musculoskeletal pain given the pleuritic component however will reassess after work-up.  As she had no neurologic deficits, no midline back tenderness, and no concerning red flags for a spinal etiology, have low suspicion that she is having a spontaneous epidural hematoma or bleed at this time.    She will be given pain medicine initially.     Heart score calculated as a 3.  12:11 PM Patient's work-up was overall reassuring.  Troponin negative x2.  She is not pregnant.  Urinalysis shows no nitrites, doubt UTI given lack of urinary symptoms.  D-dimer was negative, doubt PE.  Lactic acid normal.  Mild leukocytosis and mild anemia but platelets are now normal.  Lipase nonelevated.  Chest x-ray shows no pneumonia or other abnormality.  Clinically I now suspect her symptoms are more musculoskeletal in nature.  Given her reassuring work-up, we did not feel CT was indicated at this time however if patient has continued or worsened symptoms, CT would likely be the next test for evaluation.  As patient's pain improved with the pain medication, she will be given prescription for pain medication.  Patient will follow-up with PCP and understood return precautions.  Her vital signs were reassuring on my last evaluation as she was normotensive, not hypoxic, not tachycardic, and resting comfortably  afebrile.  An interpreter was used for all conversations and family understood plan of care.  They had no other questions or concerns.      Final Clinical Impressions(s) / ED Diagnoses   Final diagnoses:  Chest pain on breathing  Shortness of breath    ED Discharge Orders         Ordered    oxyCODONE-acetaminophen (PERCOCET/ROXICET) 5-325 MG tablet  Every 4 hours PRN     11/16/18 1222          Clinical Impression: 1. Chest pain on breathing   2. Shortness of breath     Disposition: Discharge  Condition: Good  I have discussed the results, Dx and Tx plan with the pt(& family if present). He/she/they expressed understanding and agree(s) with the plan. Discharge instructions discussed at great length. Strict return precautions discussed and pt &/or family have verbalized understanding of the instructions. No further questions at time of discharge.    New Prescriptions   OXYCODONE-ACETAMINOPHEN (PERCOCET/ROXICET) 5-325 MG TABLET    Take 1 tablet by mouth every 4 (four) hours as needed.    Follow Up: [name], [name], MD [address] [place] [id number] [phone]     [facility] [address] [id number] [place] [id number] [phone]       Geniya Fulgham, Canary Brimhristopher J, MD 11/16/18 [phone]

## 2018-11-17 LAB — URINE CULTURE

## 2018-11-18 ENCOUNTER — Inpatient Hospital Stay: Payer: Self-pay

## 2018-11-18 ENCOUNTER — Other Ambulatory Visit: Payer: Self-pay

## 2018-11-18 DIAGNOSIS — D693 Immune thrombocytopenic purpura: Secondary | ICD-10-CM

## 2018-11-18 LAB — CBC WITH DIFFERENTIAL (CANCER CENTER ONLY)
Abs Immature Granulocytes: 0.09 10*3/uL — ABNORMAL HIGH (ref 0.00–0.07)
Basophils Absolute: 0.1 10*3/uL (ref 0.0–0.1)
Basophils Relative: 0 %
Eosinophils Absolute: 0.2 10*3/uL (ref 0.0–0.5)
Eosinophils Relative: 1 %
HCT: 36.3 % (ref 36.0–46.0)
Hemoglobin: 11.7 g/dL — ABNORMAL LOW (ref 12.0–15.0)
Immature Granulocytes: 1 %
Lymphocytes Relative: 23 %
Lymphs Abs: 3.5 10*3/uL (ref 0.7–4.0)
MCH: 28.7 pg (ref 26.0–34.0)
MCHC: 32.2 g/dL (ref 30.0–36.0)
MCV: 89.2 fL (ref 80.0–100.0)
Monocytes Absolute: 1.6 10*3/uL — ABNORMAL HIGH (ref 0.1–1.0)
Monocytes Relative: 10 %
Neutro Abs: 10.1 10*3/uL — ABNORMAL HIGH (ref 1.7–7.7)
Neutrophils Relative %: 65 %
Platelet Count: 279 10*3/uL (ref 150–400)
RBC: 4.07 MIL/uL (ref 3.87–5.11)
RDW: 14.2 % (ref 11.5–15.5)
WBC Count: 15.5 10*3/uL — ABNORMAL HIGH (ref 4.0–10.5)
nRBC: 0.3 % — ABNORMAL HIGH (ref 0.0–0.2)

## 2018-11-21 ENCOUNTER — Telehealth: Payer: Self-pay

## 2018-11-21 NOTE — Telephone Encounter (Signed)
-----   Message from Truitt Merle, MD sent at 11/18/2018  9:48 PM EDT ----- Please let pt (through [place] interpreter) know her plt was normal today, I saw she was seen at ED 2 days ago for chest pain, hope she feels better now, and f/u with PCP. Thanks   Truitt Merle  11/18/2018

## 2018-11-21 NOTE — Telephone Encounter (Signed)
Almyra Free in house spanish interpreter called the patient to let her know recent lab results showed platelets are normal, also to f/u on her visit to the ED a couple of days ago.  Patient told her that she is feeling a little better but still having some symptoms and plans to follow up with PCP.

## 2018-12-02 ENCOUNTER — Other Ambulatory Visit: Payer: Self-pay

## 2018-12-16 ENCOUNTER — Telehealth: Payer: Self-pay | Admitting: Hematology

## 2018-12-16 ENCOUNTER — Inpatient Hospital Stay: Payer: Self-pay | Attending: Hematology

## 2018-12-16 ENCOUNTER — Other Ambulatory Visit: Payer: Self-pay

## 2018-12-16 ENCOUNTER — Inpatient Hospital Stay (HOSPITAL_BASED_OUTPATIENT_CLINIC_OR_DEPARTMENT_OTHER): Payer: Self-pay | Admitting: Nurse Practitioner

## 2018-12-16 ENCOUNTER — Encounter: Payer: Self-pay | Admitting: Nurse Practitioner

## 2018-12-16 VITALS — BP 108/63 | HR 70 | Temp 98.9°F | Resp 18 | Ht 61.0 in | Wt 237.7 lb

## 2018-12-16 DIAGNOSIS — R42 Dizziness and giddiness: Secondary | ICD-10-CM | POA: Insufficient documentation

## 2018-12-16 DIAGNOSIS — D693 Immune thrombocytopenic purpura: Secondary | ICD-10-CM | POA: Insufficient documentation

## 2018-12-16 DIAGNOSIS — R51 Headache: Secondary | ICD-10-CM | POA: Insufficient documentation

## 2018-12-16 DIAGNOSIS — Z79899 Other long term (current) drug therapy: Secondary | ICD-10-CM | POA: Insufficient documentation

## 2018-12-16 DIAGNOSIS — R600 Localized edema: Secondary | ICD-10-CM | POA: Insufficient documentation

## 2018-12-16 DIAGNOSIS — R5383 Other fatigue: Secondary | ICD-10-CM | POA: Insufficient documentation

## 2018-12-16 DIAGNOSIS — K5900 Constipation, unspecified: Secondary | ICD-10-CM | POA: Insufficient documentation

## 2018-12-16 DIAGNOSIS — F329 Major depressive disorder, single episode, unspecified: Secondary | ICD-10-CM | POA: Insufficient documentation

## 2018-12-16 LAB — CBC WITH DIFFERENTIAL (CANCER CENTER ONLY)
Abs Immature Granulocytes: 0.05 10*3/uL (ref 0.00–0.07)
Basophils Absolute: 0.1 10*3/uL (ref 0.0–0.1)
Basophils Relative: 0 %
Eosinophils Absolute: 0.4 10*3/uL (ref 0.0–0.5)
Eosinophils Relative: 3 %
HCT: 37.1 % (ref 36.0–46.0)
Hemoglobin: 11.8 g/dL — ABNORMAL LOW (ref 12.0–15.0)
Immature Granulocytes: 0 %
Lymphocytes Relative: 34 %
Lymphs Abs: 4.5 10*3/uL — ABNORMAL HIGH (ref 0.7–4.0)
MCH: 28.2 pg (ref 26.0–34.0)
MCHC: 31.8 g/dL (ref 30.0–36.0)
MCV: 88.5 fL (ref 80.0–100.0)
Monocytes Absolute: 1.2 10*3/uL — ABNORMAL HIGH (ref 0.1–1.0)
Monocytes Relative: 9 %
Neutro Abs: 7 10*3/uL (ref 1.7–7.7)
Neutrophils Relative %: 54 %
Platelet Count: 344 10*3/uL (ref 150–400)
RBC: 4.19 MIL/uL (ref 3.87–5.11)
RDW: 14.3 % (ref 11.5–15.5)
WBC Count: 13.2 10*3/uL — ABNORMAL HIGH (ref 4.0–10.5)
nRBC: 0 % (ref 0.0–0.2)

## 2018-12-16 NOTE — Telephone Encounter (Signed)
Scheduled per 09/28 los, patient received after visit summary and calender. °

## 2018-12-16 NOTE — Progress Notes (Signed)
[facility]   [phone] [phone]   Clinic Follow up Note   Patient Care Team: Charlynne Pander., MD as PCP - General (General Practice) Annia Belt, MD as Consulting Physician (Oncology) Stanford Breed (Hematology and Oncology) Danice Goltz, MD as Consulting Physician (Ophthalmology) Annie Main, MD (Internal Medicine) 12/16/2018  CHIEF COMPLAINT: Follow-up ITP  CURRENT THERAPY: Promacta 25 mg daily started 10/22/2018  INTERVAL HISTORY: [name] returns for follow-up as scheduled.  She was last seen on 10/21/2018 and began Promacta in the interim.  She was seen in the ED on 11/16/2018 for chest pain and dyspnea.  Troponins and D dimer were negative, chest x-ray without pneumonia or abnormality.  Her pain improved with pain medication was felt to be musculoskeletal.  He had phone f/u with PCP since then. She feels better today, less dizziness lately. Her fatigue and headaches are improving, she remains functional without difficulty. Denies fever, chills, or recent infection. She is taking promacta 1/2 tab daily. She has some gum bleeding with brushing, no epistaxis. She noticed blood with wiping after BM which is new for her, has occurred 4 times lately. She is constipated, strains with BMs. She also noticed she had 2 episodes with "strong gushes of blood" with her last period (LMP 12/01/18) which is also new for her. Menstrual bleeding is usually not heavy. Not on OCP or hormones. Last pelvic exam 1-2 years ago. Denies abdominal/pelvic pain, bloating, or cramps. She has some abdominal discomfort she attributes to being overweight. Has some leg swelling at the end of the day, denies calf pain. No cough, chest pain, dyspnea.    MEDICAL HISTORY:  Past Medical History:  Diagnosis Date  . Anemia   . Chronic ITP (idiopathic thrombocytopenia) (HCC)   . Depression   . Headache   . Retinal hemorrhage of both eyes 10/05/2015   Due to  severe thrombocytopenia from ITP    SURGICAL HISTORY: Past Surgical History:  Procedure Laterality Date  . BREAST SURGERY  biopsy  . 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  ALLERGIES:  has No Known Allergies.  MEDICATIONS:  Current Outpatient Medications  Medication Sig Dispense Refill  . acetaminophen (TYLENOL) 500 MG tablet Take 1-2 tablets (500-1,000 mg total) by mouth every 6 (six) hours as needed for mild pain or moderate pain. (Patient taking differently: Take 325 mg by mouth daily as needed for mild pain or moderate pain. ) 30 tablet 0  . citalopram (CELEXA) 10 MG tablet Take 10 mg by mouth daily.    Marland Kitchen eltrombopag (PROMACTA) 25 MG tablet Take 1 tablet (25 mg total) by mouth daily. Take on an empty stomach, 1 hour before a meal or 2 hours after. 30 tablet 1  . meclizine (ANTIVERT) 25 MG tablet Take 25 mg by mouth 3 (three) times daily as needed for dizziness.    . Multiple Vitamin (MULTIVITAMIN) capsule Take 1 capsule by mouth daily.    Marland Kitchen oxyCODONE-acetaminophen (PERCOCET/ROXICET) 5-325 MG tablet Take 1 tablet by mouth every 4 (four) hours as needed. 15 tablet 0   No current facility-administered medications for this visit.     PHYSICAL EXAMINATION: ECOG PERFORMANCE STATUS: 1 - Symptomatic but completely ambulatory  Vitals:   12/16/18 8828  BP: 108/63  Pulse: 70  Resp: 18  Temp: 98.9 F (37.2 C)  SpO2: 99%   Filed Weights   12/16/18 0838  Weight: 237 lb 11.2 oz (107.8 kg)    GENERAL:alert, no distress and comfortable SKIN: no rash  EYES: sclera clear. No conjunctival hemorrhage  LYMPH:  no palpable cervical lymphadenopathy LUNGS: clear with normal breathing effort HEART: regular rate & rhythm, no significant lower extremity edema ABDOMEN:  obese. abdomen soft, non-tender and normal bowel sounds Musculoskeletal:no cyanosis of digits  NEURO: alert & oriented x 3 with fluent speech, normal gait  LABORATORY DATA:  I have reviewed the data as listed CBC Latest Ref Rng & Units 12/16/2018 11/18/2018 11/16/2018  WBC 4.0 - 10.5 K/uL 13.2(H) 15.5(H) 14.5(H)  Hemoglobin 12.0 - 15.0 g/dL 11.8(L) 11.7(L) 11.9(L)  Hematocrit 36.0 - 46.0 % 37.1 36.3 37.1  Platelets 150 - 400 K/uL 344 279 313     CMP Latest Ref Rng & Units 11/16/2018 10/15/2018 10/14/2018  Glucose 70 - 99 mg/dL 409(W106(H) 119(J223(H) 478(G116(H)  BUN 6 - 20 mg/dL 10 12 10   Creatinine 0.44 - 1.00 mg/dL 9.560.55 2.130.81 0.860.66  Sodium 135 - 145 mmol/L 138 136 139  Potassium 3.5 - 5.1 mmol/L 3.7 4.0 3.7  Chloride 98 - 111 mmol/L 103 105 104  CO2 22 - 32 mmol/L 25 23 25   Calcium 8.9 - 10.3 mg/dL 9.1 9.4 9.2  Total Protein 6.5 - 8.1 g/dL 6.4(L) - 6.6  Total Bilirubin 0.3 - 1.2 mg/dL 0.6 - 0.4  Alkaline Phos 38 - 126 U/L 52 - 61  AST 15 - 41 U/L 23 - 20  ALT 0 - 44 U/L 20 - 24      RADIOGRAPHIC STUDIES: I have personally reviewed the radiological images as listed and agreed with the findings in the report. No results found.   ASSESSMENT & PLAN: [name] is a 43 y.o. female with   1. Recurrent ITP -She has a history of recurrent ITP. She is s/p splenectomy and treated withsteroid, IVIG,NPlate,Rituxanand chemoin the past (the first episode) dueto refractory disease -In 02/2018 she was hospitalized for smallsubarachnoid hemorrhageand severe thrombocytosisfrom ITP.She responded to dexamethasone, IVIG, and Nplate quickly in a few days. -She was hospitalized again on 10/14/18 for acute ITP flare. She was treated with dexamethasone, and one dose NPlate. CT head was negative forintracranial hemorrhage. Platelets improved from 17K to 36K.  -Due to her recurrent ITP flare she was started on maintenance TPO such as oral Promacta in 10/2018. -she misunderstood instructions and  has been taking 1/2 tab daily -Ms. Holland appears stable. She tolerates promacta well overall. Labs reviewed: PLT 344K. Continue promacta at current dose.  -Hgb 11.8, she has mild anemia which she has periodically, possibly related to recent heavy menstrual period. Will check iron studies at next visit.  -repeat labs in 4 weeks, f/u in 8 weeks   2. History of small foci ofsubarachnoid hemorrhage -Secondary to #1 -recovered well overall -Will continue to monitor  3. Dizziness, lightheadedness, borderline hypotension 10/2018 -received IVF at last visit -improved  -BP 108/63 today  4. New onset BPR and increased -4 episodes recently, notices blood with wiping after BM -she does report constipation and straining with BM -I encouraged her to start stool softener or laxative PRN for constipation -bleeding may be associated with hemorrhoids given her constipation. I recommend GI referral, she prefers to discuss with PCP first  5. Change in menstrual bleeding -LMP 9/13, she had 2 episodes of large  gush of blood  -menstrual bleeding has been normal previously. Not on OCP  -I encouraged her to discuss with PCP for possible referral to GYN, she agrees   PLAN: -Labs reviewed, PLT 344K -Continue promacta 1/2 tab daily  -check iron studies at next lab visit  -F/u with PCP re: change in menstrual bleeding and new onset blood with BM  -start OTC stool softener or laxative -Lab in 4 weeks -Lab and f/u in 8 weeks   Orders Placed This Encounter  Procedures  . Iron and TIBC    Standing Status:   Future    Standing Expiration Date:   12/16/2019  . Ferritin    Standing Status:   Future    Standing Expiration Date:   12/16/2019    All questions were answered. The patient knows to call the clinic with any problems, questions or concerns. No barriers to learning was detected. I spent 20 minutes counseling the patient face to face. The total time spent in the appointment was 25 minutes  and more than 50% was on counseling and review of test results     Pollyann Samples, NP 12/16/18

## 2019-01-13 ENCOUNTER — Inpatient Hospital Stay: Payer: Self-pay | Attending: Nurse Practitioner

## 2019-02-06 NOTE — Progress Notes (Signed)
[facility]   [phone] [phone]   Clinic Follow up Note   Patient Care Team: Gayla Doss., MD as PCP - General (General Practice) Levert Feinstein, MD as Consulting Physician (Oncology) [name] (Hematology and Oncology) Elwin Mocha, MD as Consulting Physician (Ophthalmology) Justice Deeds, MD (Internal Medicine)  Date of Service:  02/10/2019  CHIEF COMPLAINT: F/u of ITP  CURRENT THERAPY:  Promacta 25mg  daily starting 10/22/18   INTERVAL HISTORY:  [name] is here for a follow up of ITP. She presents to the clinic alone with electronic [employer] spanish interpretor. She notes she is well but tired. She feels she is still able to do activities. She denies bleeding. She notes lightheadedness has much improved. She notes she is out of Promacta and has not been able to take it. She last took it 1 week ago.    REVIEW OF SYSTEMS:   Constitutional: Denies fevers, chills or abnormal weight loss (+) improved lightheadedness  Eyes: Denies blurriness of vision Ears, nose, mouth, throat, and face: Denies mucositis or sore throat Respiratory: Denies cough, dyspnea or wheezes Cardiovascular: Denies palpitation, chest discomfort or lower extremity swelling Gastrointestinal:  Denies nausea, heartburn or change in bowel habits Skin: Denies abnormal skin rashes Lymphatics: Denies new lymphadenopathy or easy bruising Neurological:Denies numbness, tingling or new weaknesses Behavioral/Psych: Mood is stable, no new changes  All other systems were reviewed with the patient and are negative.  MEDICAL HISTORY:  Past Medical History:  Diagnosis Date  . Anemia   . Chronic ITP (idiopathic thrombocytopenia) (HCC)   . Depression   . Headache   . 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 BREAST SURGERY   biopsy  . 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  ALLERGIES:  has No Known Allergies.  MEDICATIONS:  Current Outpatient Medications  Medication Sig Dispense Refill  . acetaminophen (TYLENOL) 500 MG tablet Take 1-2 tablets (500-1,000 mg total) by mouth every 6 (six) hours as needed for mild pain or moderate pain. (Patient taking differently: Take 325 mg by mouth daily as needed for mild pain or moderate pain. ) 30 tablet 0  . citalopram (CELEXA) 10 MG tablet Take 10 mg by mouth daily.    Chevis Pretty eltrombopag (PROMACTA) 25 MG tablet Take 1 tablet (25 mg total) by mouth daily. Take on an empty stomach, 1 hour before a meal or 2 hours after. 30 tablet 9  . meclizine (ANTIVERT) 25 MG tablet Take 25 mg by mouth 3 (three) times daily as needed for dizziness.    . Multiple Vitamin (MULTIVITAMIN) capsule Take 1 capsule by mouth daily.    Marland Kitchen oxyCODONE-acetaminophen (PERCOCET/ROXICET) 5-325 MG tablet Take 1 tablet by mouth every 4 (four) hours as needed. (Patient not taking: Reported on 02/10/2019) 15 tablet 0   No current facility-administered medications for this visit.     PHYSICAL EXAMINATION: ECOG PERFORMANCE STATUS: 0 - Asymptomatic  Vitals:   02/10/19 1021  BP: (!) 105/51  Pulse: 75  Resp: 16  Temp: 97.9 F (36.6 C)  SpO2: 100%   Filed Weights   02/10/19 1021  Weight: 242 lb 11.2  oz (110.1 kg)    GENERAL:alert, no distress and comfortable SKIN: skin color, texture, turgor are normal, no rashes or significant lesions NEURO: alert & oriented x 3 with fluent speech, no focal motor/sensory deficits  LABORATORY DATA:  I have reviewed the data as listed CBC Latest Ref Rng & Units 02/10/2019 12/16/2018 11/18/2018  WBC 4.0 - 10.5 K/uL 9.8 13.2(H) 15.5(H)  Hemoglobin  12.0 - 15.0 g/dL 12.5 11.8(L) 11.7(L)  Hematocrit 36.0 - 46.0 % 38.6 37.1 36.3  Platelets 150 - 400 K/uL 273 344 279     CMP Latest Ref Rng & Units 11/16/2018 10/15/2018 10/14/2018  Glucose 70 - 99 mg/dL 106(H) 223(H) 116(H)  BUN 6 - 20 mg/dL 10 12 10   Creatinine 0.44 - 1.00 mg/dL 0.55 0.81 0.66  Sodium 135 - 145 mmol/L 138 136 139  Potassium 3.5 - 5.1 mmol/L 3.7 4.0 3.7  Chloride 98 - 111 mmol/L 103 105 104  CO2 22 - 32 mmol/L 25 23 25   Calcium 8.9 - 10.3 mg/dL 9.1 9.4 9.2  Total Protein 6.5 - 8.1 g/dL 6.4(L) - 6.6  Total Bilirubin 0.3 - 1.2 mg/dL 0.6 - 0.4  Alkaline Phos 38 - 126 U/L 52 - 61  AST 15 - 41 U/L 23 - 20  ALT 0 - 44 U/L 20 - 24      RADIOGRAPHIC STUDIES: I have personally reviewed the radiological images as listed and agreed with the findings in the report. No results found.   ASSESSMENT & PLAN:  [name] is a 43 y.o. female with   1. Recurrent ITP -She has a history of recurrent ITP. She is s/p splenectomy and treated withsteroid, IVIG,NPlate,Rituxanand chemoin the past (the first episode) dueto refractory disease -In 02/2018 she was hospitalized for smallsubarachnoid hemorrhageand severe thrombocytosisfrom ITP.She responded to dexamethasone, IVIG, and Nplate quickly in a few days. -She was hospitalized again on 10/14/18 for acute ITP flare. She was treated with dexamethasone, and one dose NPlate. CT head was negative forintracranial hemorrhage. Platelets improved from 17K to 36K.  -Due to her recurrent ITP flare, she is currently on maintenance TPO oral Promacta 25mg  daily starting 10/22/18. She is currently on patient assistance through 10/2019.  -She is clinically doing well. No recent bleeding. Labs reviewed and CBC WNL. Iron panel still pending. Overall good response to Promacta. Will continue.  -She ran out of medication last week, will refill through [facility].  -Will repeat lab in 3 months. F/u in 6 months    2. History of  small foci ofsubarachnoid hemorrhage -Secondary to #1 -recovered well overall. Will continue to monitor.    3. Dizziness, lightheadedness, borderline hypotension  -she had similar symptoms before, intermittent.  -near resolve dnow  -She can continue to f/u with her PCP    PLAN: -She is clinically doing well. Thrombocytopenia currently resolved  -Continue Promacta, will refill today to [facility] in 3 months  -Lab and f/u in 6 months    No problem-specific Assessment & Plan notes found for this encounter.   No orders of the defined types were placed in this encounter.  All questions were answered. The patient knows to call the clinic with any problems, questions or concerns. No barriers to learning was detected. I spent 10 minutes counseling the patient face to face. The total time spent in the appointment was 15 minutes and more than 50% was on counseling and review of test results     Truitt Merle, MD 02/10/2019   Normajean Baxter  Richardson Landry, am acting as scribe for Truitt Merle, MD.   I have reviewed the above documentation for accuracy and completeness, and I agree with the above.

## 2019-02-10 ENCOUNTER — Other Ambulatory Visit: Payer: Self-pay

## 2019-02-10 ENCOUNTER — Inpatient Hospital Stay: Payer: Self-pay | Attending: Nurse Practitioner | Admitting: Hematology

## 2019-02-10 ENCOUNTER — Encounter: Payer: Self-pay | Admitting: Hematology

## 2019-02-10 ENCOUNTER — Telehealth: Payer: Self-pay | Admitting: Hematology

## 2019-02-10 ENCOUNTER — Inpatient Hospital Stay: Payer: Self-pay

## 2019-02-10 VITALS — BP 105/51 | HR 75 | Temp 97.9°F | Resp 16 | Ht 61.0 in | Wt 242.7 lb

## 2019-02-10 DIAGNOSIS — Z9081 Acquired absence of spleen: Secondary | ICD-10-CM | POA: Insufficient documentation

## 2019-02-10 DIAGNOSIS — R5383 Other fatigue: Secondary | ICD-10-CM | POA: Insufficient documentation

## 2019-02-10 DIAGNOSIS — F329 Major depressive disorder, single episode, unspecified: Secondary | ICD-10-CM | POA: Insufficient documentation

## 2019-02-10 DIAGNOSIS — Z79899 Other long term (current) drug therapy: Secondary | ICD-10-CM | POA: Insufficient documentation

## 2019-02-10 DIAGNOSIS — D693 Immune thrombocytopenic purpura: Secondary | ICD-10-CM

## 2019-02-10 DIAGNOSIS — R42 Dizziness and giddiness: Secondary | ICD-10-CM | POA: Insufficient documentation

## 2019-02-10 DIAGNOSIS — I959 Hypotension, unspecified: Secondary | ICD-10-CM | POA: Insufficient documentation

## 2019-02-10 LAB — CBC WITH DIFFERENTIAL (CANCER CENTER ONLY)
Abs Immature Granulocytes: 0.03 10*3/uL (ref 0.00–0.07)
Basophils Absolute: 0 10*3/uL (ref 0.0–0.1)
Basophils Relative: 0 %
Eosinophils Absolute: 0.2 10*3/uL (ref 0.0–0.5)
Eosinophils Relative: 2 %
HCT: 38.6 % (ref 36.0–46.0)
Hemoglobin: 12.5 g/dL (ref 12.0–15.0)
Immature Granulocytes: 0 %
Lymphocytes Relative: 33 %
Lymphs Abs: 3.3 10*3/uL (ref 0.7–4.0)
MCH: 28.4 pg (ref 26.0–34.0)
MCHC: 32.4 g/dL (ref 30.0–36.0)
MCV: 87.7 fL (ref 80.0–100.0)
Monocytes Absolute: 0.8 10*3/uL (ref 0.1–1.0)
Monocytes Relative: 8 %
Neutro Abs: 5.5 10*3/uL (ref 1.7–7.7)
Neutrophils Relative %: 57 %
Platelet Count: 273 10*3/uL (ref 150–400)
RBC: 4.4 MIL/uL (ref 3.87–5.11)
RDW: 14.4 % (ref 11.5–15.5)
WBC Count: 9.8 10*3/uL (ref 4.0–10.5)
nRBC: 0 % (ref 0.0–0.2)

## 2019-02-10 LAB — IRON AND TIBC
Iron: 46 ug/dL (ref 41–142)
Saturation Ratios: 12 % — ABNORMAL LOW (ref 21–57)
TIBC: 380 ug/dL (ref 236–444)
UIBC: 334 ug/dL (ref 120–384)

## 2019-02-10 LAB — FERRITIN: Ferritin: 24 ng/mL (ref 11–307)

## 2019-02-10 MED ORDER — ELTROMBOPAG OLAMINE 25 MG PO TABS: 25.0000 mg | ORAL_TABLET | Freq: Every day | ORAL | 9 refills | Status: DC

## 2019-02-10 NOTE — Telephone Encounter (Signed)
Scheduled per los. Gave avs and calendar  

## 2019-02-11 ENCOUNTER — Telehealth: Payer: Self-pay | Admitting: *Deleted

## 2019-02-11 NOTE — Telephone Encounter (Signed)
Per Cira Rue, NP, notified [employer] Free Spanish interpreter to contact pt regarding iron level being in low normal range. Recommended to start multivitamin w/iron once daily and lab repeat in 3 months as Dr. Burr Medico discussed. Awaiting response from interpreter.

## 2019-02-11 NOTE — Telephone Encounter (Signed)
-----   Message from Alla Feeling, NP sent at 02/11/2019  2:41 PM EST ----- Please let her know iron level is in low-normal range. I recommend for her to start multivitamin with iron once daily. Repeat labs in 3 months as discussed with Dr. Burr Medico.  Thanks, Regan Rakers NP

## 2019-03-02 ENCOUNTER — Encounter (HOSPITAL_COMMUNITY): Payer: Self-pay | Admitting: Emergency Medicine

## 2019-03-02 ENCOUNTER — Emergency Department (HOSPITAL_COMMUNITY): Payer: Self-pay

## 2019-03-02 ENCOUNTER — Other Ambulatory Visit: Payer: Self-pay

## 2019-03-02 ENCOUNTER — Inpatient Hospital Stay (HOSPITAL_COMMUNITY)
Admission: EM | Admit: 2019-03-02 | Discharge: 2019-03-06 | DRG: 813 | Disposition: A | Discharge status: Home / Self Care | Payer: Self-pay | Attending: Internal Medicine | Admitting: Internal Medicine

## 2019-03-02 DIAGNOSIS — I609 Nontraumatic subarachnoid hemorrhage, unspecified: Secondary | ICD-10-CM

## 2019-03-02 DIAGNOSIS — Z862 Personal history of diseases of the blood and blood-forming organs and certain disorders involving the immune mechanism: Secondary | ICD-10-CM

## 2019-03-02 DIAGNOSIS — I629 Nontraumatic intracranial hemorrhage, unspecified: Secondary | ICD-10-CM | POA: Diagnosis present

## 2019-03-02 DIAGNOSIS — Z6841 Body Mass Index (BMI) 40.0 and over, adult: Secondary | ICD-10-CM

## 2019-03-02 DIAGNOSIS — Z833 Family history of diabetes mellitus: Secondary | ICD-10-CM

## 2019-03-02 DIAGNOSIS — D72829 Elevated white blood cell count, unspecified: Secondary | ICD-10-CM | POA: Diagnosis present

## 2019-03-02 DIAGNOSIS — Z9081 Acquired absence of spleen: Secondary | ICD-10-CM

## 2019-03-02 DIAGNOSIS — T380X5A Adverse effect of glucocorticoids and synthetic analogues, initial encounter: Secondary | ICD-10-CM | POA: Diagnosis present

## 2019-03-02 DIAGNOSIS — Z803 Family history of malignant neoplasm of breast: Secondary | ICD-10-CM

## 2019-03-02 DIAGNOSIS — Z20828 Contact with and (suspected) exposure to other viral communicable diseases: Secondary | ICD-10-CM | POA: Diagnosis present

## 2019-03-02 DIAGNOSIS — Z79891 Long term (current) use of opiate analgesic: Secondary | ICD-10-CM

## 2019-03-02 DIAGNOSIS — F329 Major depressive disorder, single episode, unspecified: Secondary | ICD-10-CM | POA: Diagnosis present

## 2019-03-02 DIAGNOSIS — K068 Other specified disorders of gingiva and edentulous alveolar ridge: Secondary | ICD-10-CM | POA: Diagnosis present

## 2019-03-02 DIAGNOSIS — Z79899 Other long term (current) drug therapy: Secondary | ICD-10-CM

## 2019-03-02 DIAGNOSIS — D693 Immune thrombocytopenic purpura: Principal | ICD-10-CM | POA: Diagnosis present

## 2019-03-02 DIAGNOSIS — Z23 Encounter for immunization: Secondary | ICD-10-CM

## 2019-03-02 DIAGNOSIS — R21 Rash and other nonspecific skin eruption: Secondary | ICD-10-CM | POA: Diagnosis present

## 2019-03-02 DIAGNOSIS — D696 Thrombocytopenia, unspecified: Secondary | ICD-10-CM | POA: Diagnosis present

## 2019-03-02 LAB — CBC WITH DIFFERENTIAL/PLATELET
Abs Immature Granulocytes: 0.06 K/uL (ref 0.00–0.07)
Basophils Absolute: 0 K/uL (ref 0.0–0.1)
Basophils Relative: 0 %
Eosinophils Absolute: 0.2 K/uL (ref 0.0–0.5)
Eosinophils Relative: 2 %
HCT: 39.1 % (ref 36.0–46.0)
Hemoglobin: 12.7 g/dL (ref 12.0–15.0)
Immature Granulocytes: 1 %
Lymphocytes Relative: 38 %
Lymphs Abs: 3.4 K/uL (ref 0.7–4.0)
MCH: 28.7 pg (ref 26.0–34.0)
MCHC: 32.5 g/dL (ref 30.0–36.0)
MCV: 88.5 fL (ref 80.0–100.0)
Monocytes Absolute: 0.8 K/uL (ref 0.1–1.0)
Monocytes Relative: 9 %
Neutro Abs: 4.6 K/uL (ref 1.7–7.7)
Neutrophils Relative %: 50 %
Platelets: <5 K/uL — CL (ref 150–400)
RBC: 4.42 MIL/uL (ref 3.87–5.11)
RDW: 14.2 % (ref 11.5–15.5)
WBC: 9.1 K/uL (ref 4.0–10.5)
nRBC: 0 % (ref 0.0–0.2)

## 2019-03-02 LAB — COMPREHENSIVE METABOLIC PANEL WITH GFR
ALT: 18 U/L (ref 0–44)
AST: 17 U/L (ref 15–41)
Albumin: 3.6 g/dL (ref 3.5–5.0)
Alkaline Phosphatase: 55 U/L (ref 38–126)
Anion gap: 9 (ref 5–15)
BUN: 11 mg/dL (ref 6–20)
CO2: 24 mmol/L (ref 22–32)
Calcium: 9.1 mg/dL (ref 8.9–10.3)
Chloride: 106 mmol/L (ref 98–111)
Creatinine, Ser: 0.63 mg/dL (ref 0.44–1.00)
GFR calc Af Amer: >60 mL/min
GFR calc non Af Amer: >60 mL/min
Glucose, Bld: 135 mg/dL — ABNORMAL HIGH (ref 70–99)
Potassium: 3.9 mmol/L (ref 3.5–5.1)
Sodium: 139 mmol/L (ref 135–145)
Total Bilirubin: 0.5 mg/dL (ref 0.3–1.2)
Total Protein: 6.7 g/dL (ref 6.5–8.1)

## 2019-03-02 LAB — PROTIME-INR
INR: 1 (ref 0.8–1.2)
Prothrombin Time: 12.7 s (ref 11.4–15.2)

## 2019-03-02 LAB — I-STAT BETA HCG BLOOD, ED (MC, WL, AP ONLY): I-stat hCG, quantitative: <5 m[IU]/mL (ref <5)

## 2019-03-02 MED ORDER — SODIUM CHLORIDE 0.9 % IV SOLN: 250.0000 mL | INTRAVENOUS | Status: DC | PRN

## 2019-03-02 MED ORDER — ONDANSETRON HCL 4 MG/2ML IJ SOLN: 4.0000 mg | Freq: Four times a day (QID) | INTRAMUSCULAR | Status: DC | PRN

## 2019-03-02 MED ORDER — HYDROCODONE-ACETAMINOPHEN 5-325 MG PO TABS: 1.0000 | ORAL_TABLET | ORAL | Status: DC | PRN

## 2019-03-02 MED ORDER — SODIUM CHLORIDE 0.9% FLUSH: 3.0000 mL | INTRAVENOUS | Status: DC | PRN

## 2019-03-02 MED ORDER — CITALOPRAM HYDROBROMIDE 10 MG PO TABS
10.0000 mg | ORAL_TABLET | Freq: Every day | ORAL | Status: DC
  Administered 2019-03-03 – 2019-03-06 (×4): 10 mg via ORAL
  Filled 2019-03-02 (×5): qty 1

## 2019-03-02 MED ORDER — DEXAMETHASONE 4 MG PO TABS
40.0000 mg | ORAL_TABLET | Freq: Once | ORAL | Status: AC
  Administered 2019-03-02: 40 mg via ORAL
  Filled 2019-03-02: qty 10

## 2019-03-02 MED ORDER — ACETAMINOPHEN 650 MG RE SUPP: 650.0000 mg | Freq: Four times a day (QID) | RECTAL | Status: DC | PRN

## 2019-03-02 MED ORDER — SODIUM CHLORIDE 0.9% FLUSH
3.0000 mL | Freq: Two times a day (BID) | INTRAVENOUS | Status: DC
  Administered 2019-03-03 – 2019-03-06 (×6): 3 mL via INTRAVENOUS

## 2019-03-02 MED ORDER — ONDANSETRON HCL 4 MG PO TABS: 4.0000 mg | ORAL_TABLET | Freq: Four times a day (QID) | ORAL | Status: DC | PRN

## 2019-03-02 MED ORDER — DEXAMETHASONE 4 MG PO TABS
40.0000 mg | ORAL_TABLET | Freq: Every day | ORAL | Status: DC
  Administered 2019-03-03 – 2019-03-06 (×4): 40 mg via ORAL
  Filled 2019-03-02 (×4): qty 10

## 2019-03-02 MED ORDER — ACETAMINOPHEN 325 MG PO TABS: 650.0000 mg | ORAL_TABLET | Freq: Four times a day (QID) | ORAL | Status: DC | PRN

## 2019-03-02 NOTE — ED Provider Notes (Signed)
[facility] EMERGENCY DEPARTMENT Provider Note   CSN: [id number] Arrival date & time: 03/02/19  1607     History Chief Complaint  Patient presents with  . gums bleeding    [name] is a 43 y.o. female.  HPI    Patient with history of chronic ITP presents with bleeding from her gums that started yesterday morning and a new rash to her lower extremities which she noticed today.  She also had some dizziness which she describes as a spinning sensation.  She denies any recent fever or chills.  No visual changes.  She complains of mild generalized headache.  No chest pain, shortness of breath or cough.  No abdominal pain vomiting or diarrhea.  No blood in the stool. Past Medical History:  Diagnosis Date  . Anemia   . Chronic ITP (idiopathic thrombocytopenia) (HCC)   . Depression   . Headache   . 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 Borderline diabetes 10/16/2018  . Idiopathic thrombocytopenic purpura (ITP) (HCC) 10/14/2018  . Obesity, Class III, BMI 40-49.9 (morbid obesity) (HCC) 10/14/2018  . Hyperglycemia 10/14/2018  . 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 Chronic ITP (idiopathic thrombocytopenia) (HCC)   . Nontraumatic intracerebral hemorrhage (HCC)   . Menorrhagia with regular cycle   . Pressure ulcer 09/19/2015  . Gingival bleeding   . Chronic hepatitis B (HCC) 09/04/2015  . Subarachnoidal hemorrhage (HCC) 09/04/2015  . Severe thrombocytopenia (HCC) 09/04/2015  . Acute ITP (HCC) 08/28/2015    Past Surgical History:  Procedure Laterality Date  . BREAST SURGERY  biopsy  . 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  Social History   Tobacco Use  . Smoking status: Never Smoker  . Smokeless tobacco: Never Used  Substance Use Topics  . Alcohol use: No    Alcohol/week: 0.0 standard drinks  . Drug use: No    Home Medications Prior to Admission medications   Medication Sig Start Date End Date Taking? Authorizing Provider  polyethylene glycol powder (GLYCOLAX/MIRALAX) 17 GM/SCOOP powder Take by mouth. 10/01/15  Yes [provider]  acetaminophen (TYLENOL) 500 MG tablet Take 1-2 tablets (500-1,000 mg total) by mouth every 6 (six) hours as needed for mild pain or moderate pain. Patient taking differently: Take 500 mg by mouth daily as needed for mild pain or moderate pain.  09/22/15   Burns, Tinnie Gens, MD  citalopram (CELEXA) 10 MG tablet Take 10 mg by mouth daily.    [provider]  eltrombopag (PROMACTA) 25 MG tablet Take 1 tablet (25 mg total) by mouth daily. Take on an empty stomach, 1 hour before a meal or 2 hours after. 02/10/19   Malachy Mood, MD  meclizine (ANTIVERT) 25 MG tablet Take 25 mg by mouth 3 (three) times daily as  needed for dizziness.    [provider]  Multiple Vitamin (MULTIVITAMIN) capsule Take 1 capsule by mouth daily.    [provider]  oxyCODONE-acetaminophen (PERCOCET/ROXICET) 5-325 MG tablet Take 1 tablet by mouth every 4 (four) hours as needed. Patient not taking: Reported on 02/10/2019 11/16/18   Tegeler, Gwenyth Allegra, MD    Allergies    Patient has no known allergies.  Review of Systems   Review of Systems  Constitutional: Negative for chills and fever.  HENT: Negative for sore throat and trouble swallowing.   Eyes: Negative for pain and visual disturbance.  Respiratory: Negative for cough and shortness of breath.   Cardiovascular: Negative for chest pain.  Gastrointestinal: Negative for abdominal pain, diarrhea, nausea and vomiting.   Genitourinary: Negative for dysuria and flank pain.  Musculoskeletal: Negative for back pain, myalgias and neck pain.  Skin: Positive for rash.  Neurological: Positive for dizziness and headaches. Negative for weakness and numbness.  All other systems reviewed and are negative.   Physical Exam Updated Vital Signs BP 113/72 (BP Location: Right Arm)   Pulse 79   Temp 98.3 F (36.8 C) (Oral)   Resp 16   Ht 4\' 11"  (1.499 m)   Wt 112 kg   LMP 02/02/2019   SpO2 97%   BMI 49.89 kg/m   Physical Exam Vitals and nursing note reviewed.  Constitutional:      Appearance: Normal appearance. She is well-developed.  HENT:     Head: Normocephalic and atraumatic.     Comments: No active intraoral hemorrhage    Nose: Nose normal.     Mouth/Throat:     Mouth: Mucous membranes are moist.  Eyes:     Pupils: Pupils are equal, round, and reactive to light.  Cardiovascular:     Rate and Rhythm: Normal rate and regular rhythm.  Pulmonary:     Effort: Pulmonary effort is normal. No respiratory distress.     Breath sounds: Normal breath sounds. No stridor. No wheezing, rhonchi or rales.  Chest:     Chest wall: No tenderness.  Abdominal:     General: Bowel sounds are normal.     Palpations: Abdomen is soft.     Tenderness: There is no abdominal tenderness. There is no guarding or rebound.  Musculoskeletal:        General: No tenderness. Normal range of motion.     Cervical back: Normal range of motion and neck supple. No rigidity or tenderness.  Lymphadenopathy:     Cervical: No cervical adenopathy.  Skin:    General: Skin is warm and dry.     Findings: Rash present. No erythema.     Comments: Nonblanching petechiae to the lower extremities  Neurological:     General: No focal deficit present.     Mental Status: She is alert and oriented to person, place, and time.     Comments: 5/5 motor in all extremities.  Sensation fully intact.  Patient ambulates without difficulty.  Psychiatric:         Behavior: Behavior normal.     ED Results / Procedures / Treatments   Labs (all labs ordered are listed, but only abnormal results are displayed) Labs Reviewed  CBC WITH DIFFERENTIAL/PLATELET - Abnormal; Notable for the following components:      Result Value   Platelets <5 (*)    All other components within normal limits  COMPREHENSIVE METABOLIC PANEL - Abnormal; Notable for the following components:   Glucose, Bld 135 (*)  All other components within normal limits  SARS CORONAVIRUS 2 (TAT 6-24 HRS)  PROTIME-INR  I-STAT BETA HCG BLOOD, ED ([facility], [facility], [facility] ONLY)    EKG None  Radiology CT Head Wo Contrast  Result Date: 03/02/2019 CLINICAL DATA:  Headache, low platelets, petechiae EXAM: CT HEAD WITHOUT CONTRAST TECHNIQUE: Contiguous axial images were obtained from the base of the skull through the vertex without intravenous contrast. COMPARISON:  CT brain, 10/14/2018 FINDINGS: Brain: No evidence of acute infarction, hemorrhage, hydrocephalus, extra-axial collection or mass lesion/mass effect. There are multiple unchanged small foci of high attenuation in the cerebral hemispheres near the gray-white junction (e.g. series 3, image 21, 19). Vascular: No hyperdense vessel or unexpected calcification. Skull: Normal. Negative for fracture or focal lesion. Sinuses/Orbits: No acute finding. Other: None. IMPRESSION: No acute intracranial pathology. No evidence of acute intracranial hemorrhage. There are multiple unchanged small foci of high attenuation in the cerebral hemispheres near the gray-white junction (e.g. series 3, image 21, 19), these small calcifications of uncertain etiology and nonacute, perhaps related to prior small junctional hemorrhages. Electronically Signed   By: Lauralyn PrimesAlex  Bibbey M.D.   On: 03/02/2019 19:57    Procedures Procedures (including critical care time)  Medications Ordered in ED Medications  dexamethasone (DECADRON) tablet 40 mg (has no administration in time  range)    ED Course  I have reviewed the triage vital signs and the nursing notes.  Pertinent labs & imaging results that were available during my care of the patient were reviewed by me and considered in my medical decision making (see chart for details).    MDM Rules/Calculators/A&P     CHA2DS2/VAS Stroke Risk Points      N/A >= 2 Points: High Risk  1 - 1.99 Points: Medium Risk  0 Points: Low Risk    A final score could not be computed because of missing components.: Last  Change: N/A     This score determines the patient's risk of having a stroke if the  patient has atrial fibrillation.      This score is not applicable to this patient. Components are not  calculated.                   CT head without acute findings.  Discussed with oncology on-call.  Recommended giving patient dexamethasone 40 mg orally and will see patient and make further recommendations in the emergency department.  Also spoke with hospitalist who will admit. Final Clinical Impression(s) / ED Diagnoses Final diagnoses:  Thrombocytopenia [facility](HCC)    Rx / DC Orders ED Discharge Orders    None       Loren RacerYelverton, Cathy Ropp, MD 03/02/19 2004

## 2019-03-02 NOTE — ED Notes (Signed)
Dr. Sherry Ruffing notified of critical platelets results.  Charge RN notified of need for room for pt.

## 2019-03-02 NOTE — H&P (Signed)
[name] WUJ:[id number] DOB: 02/04/1976 DOA: 03/02/2019     PCP: [name], [name], MD   Outpatient Specialists:      Oncology   Dr. Mosetta PuttFeng    Patient arrived to ER on 03/02/19 at 1607  Patient coming from: home Lives  With family    Chief Complaint:   Chief Complaint  Patient presents with  . gums bleeding    HPI: [name] is a 43 y.o. female with medical history significant of ITP, depression, Headache    Presented with   bleeding gums and petechiae all over her body she presented to emergency department because she knows that this happens when her platelet levels goes down.  She also endorses mild headache. She had a little bit of dizziness and spinning-like sensation.  No recent fevers or chills no chest pains no shortness of breath no abdominal pain She has run out of Promacta  Of note patient's mother just passed away from breast cancer last week  Infectious risk factors:  Reports none     In  ER RAPID COVID TEST   in house  PCR testing  Pending  Lab Results  Component Value Date   SARSCOV2NAA NEGATIVE 10/14/2018     Regarding pertinent Chronic problems:     ITP -followed by oncology patient status post splenectomy has been treated in the past with steroids and IVIG NPlate,Rituxanand chemo Had to be hospitalized in the past secondary to small subarachnoid hemorrhages at that time she responded to dexamethasone IVIG and Nplate.  Recurrent hospitalization in July 2020 seem to have also responded to dexamethasone and Nplate  She has been on maintenance oral Promacta starting from August but recently ran out.  While in ER: Found to have platelets less than 5 Started on Decadron 40 mg tablet CT head done no evidence of acute bleeding Discussed with oncology  ER Provider Called:   Oncology Dr. Leonides SchanzRSEY  They Recommend admit to medicine give dexamethasone 40 mg hold off on plt transfusion at this time Will see  in ER   The  following Work up has been ordered so far:  Orders Placed This Encounter  Procedures  . SARS CORONAVIRUS 2 (TAT 6-24 HRS) Nasopharyngeal Nasopharyngeal Swab  . CT Head Wo Contrast  . CBC with Differential  . Protime-INR  . Comprehensive metabolic panel  . Consult to oncology  ALL PATIENTS BEING ADMITTED/HAVING PROCEDURES NEED COVID-19 SCREENING  . Consult for Unassigned Medical Admission  ALL PATIENTS BEING ADMITTED/HAVING PROCEDURES NEED COVID-19 SCREENING  . I-Stat Beta hCG blood, ED ([facility], [facility], [facility] only)     Following Medications were ordered in ER: Medications  dexamethasone (DECADRON) tablet 40 mg (has no administration in time range)      Significant initial  Findings: Abnormal Labs Reviewed  CBC WITH DIFFERENTIAL/PLATELET - Abnormal; Notable for the following components:      Result Value   Platelets <5 (*)    All other components within normal limits  COMPREHENSIVE METABOLIC PANEL - Abnormal; Notable for the following components:   Glucose, Bld 135 (*)    All other components within normal limits      Otherwise labs showing:    Recent Labs  Lab 03/02/19 1705  NA 139  K 3.9  CO2 24  GLUCOSE 135*  BUN 11  CREATININE 0.63  CALCIUM 9.1    Cr  stable,    Lab Results  Component Value Date   CREATININE 0.63 03/02/2019   CREATININE 0.55 11/16/2018  CREATININE 0.81 10/15/2018    Recent Labs  Lab 03/02/19 1705  AST 17  ALT 18  ALKPHOS 55  BILITOT 0.5  PROT 6.7  ALBUMIN 3.6   Lab Results  Component Value Date   CALCIUM 9.1 03/02/2019    WBC      Component Value Date/Time   WBC 9.1 03/02/2019 1705   ANC    Component Value Date/Time   NEUTROABS 4.6 03/02/2019 1705   ALC No components found for: LYMPHAB    Plt: Lab Results  Component Value Date   PLT <5 (LL) 03/02/2019     Lactic Acid, Venous    Component Value Date/Time   LATICACIDVEN 0.8 11/16/2018 0859    d   COVID-19 Labs  No results for input(s): DDIMER, FERRITIN, LDH,  CRP in the last 72 hours.  Lab Results  Component Value Date   SARSCOV2NAA NEGATIVE 10/14/2018     HG/HCT   stable,      Component Value Date/Time   HGB 12.7 03/02/2019 1705   HGB 12.5 02/10/2019 1005   HCT 39.1 03/02/2019 1705   HCT 27.3 (L) 09/21/2015 0900       ECG: not  Ordered     DM  labs:  HbA1C: Recent Labs    10/14/18 1130  HGBA1C 6.0*    UA  not ordered      Ordered  CT HEAD  NON acute     ED Triage Vitals  Enc Vitals Group     BP 03/02/19 1614 113/72     Pulse Rate 03/02/19 1614 79     Resp 03/02/19 1614 16     Temp 03/02/19 1614 98.3 F (36.8 C)     Temp Source 03/02/19 1614 Oral     SpO2 03/02/19 1614 97 %     Weight 03/02/19 1614 247 lb (112 kg)     Height 03/02/19 1614  (1.499 m)     Head Circumference --      Peak Flow --      Pain Score 03/02/19 1659 0     Pain Loc --      Pain Edu? --      Excl. in GC? --   TMAX(24)@       Latest  Blood pressure 113/72, pulse 79, temperature 98.3 F (36.8 C), temperature source Oral, resp. rate 16, height  (1.499 m), weight 112 kg, last menstrual period 02/02/2019, SpO2 97 %.     Hospitalist was called for admission for ITP active Flare    Review of Systems:    Pertinent positives include:petichia, gum bleeding  Constitutional:  No weight loss, night sweats, Fevers, chills, fatigue, weight loss  HEENT:  No headaches, Difficulty swallowing,Tooth/dental problems,Sore throat,  No sneezing, itching, ear ache, nasal congestion, post nasal drip,  Cardio-vascular:  No chest pain, Orthopnea, PND, anasarca, dizziness, palpitations.no Bilateral lower extremity swelling  GI:  No heartburn, indigestion, abdominal pain, nausea, vomiting, diarrhea, change in bowel habits, loss of appetite, melena, blood in stool, hematemesis Resp:  no shortness of breath at rest. No dyspnea on exertion, No excess mucus, no productive cough, No non-productive cough, No coughing up of blood.No change in color of  mucus.No wheezing. Skin:  no rash or lesions. No jaundice GU:  no dysuria, change in color of urine, no urgency or frequency. No straining to urinate.  No flank pain.  Musculoskeletal:  No joint pain or no joint swelling. No decreased range of motion. No back pain.  Psych:  No change in mood or affect. No depression or anxiety. No memory loss.  Neuro: no localizing neurological complaints, no tingling, no weakness, no double vision, no gait abnormality, no slurred speech, no confusion  All systems reviewed and apart from HOPI all are negative  Past Medical History:   Past Medical History:  Diagnosis Date  . Anemia   . Chronic ITP (idiopathic thrombocytopenia) (HCC)   . Depression   . Headache   . 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 BREAST SURGERY  biopsy  . SPLENECTOMY, TOTAL N/A 09/16/2015   Procedure: OPEN SPLENECTOMY;  Surgeon: Chevis Pretty III, MD;  Location: [facility] OR;  Service: General;  Laterality: N/A;    Social History:  Ambulatory  Independently      reports that she has never smoked. She has never used smokeless tobacco. She reports that she does not drink alcohol or use drugs.   Family History:   Family History  Problem Relation Age of Onset  . Diabetes Mother   . Breast cancer Mother     Allergies: No Known Allergies   Prior to Admission medications   Medication Sig Start Date End Date Taking? Authorizing Provider  acetaminophen (TYLENOL) 500 MG tablet Take 1-2 tablets (500-1,000 mg total) by mouth every 6 (six) hours as needed for mild pain or moderate pain. Patient taking differently: Take 325 mg by mouth daily as needed for mild pain or moderate pain.  09/22/15   Burns, Tinnie Gens, MD  citalopram (CELEXA) 10 MG tablet Take 10 mg by mouth daily.    [provider]  eltrombopag (PROMACTA) 25 MG tablet Take 1 tablet (25 mg total) by mouth daily.  Take on an empty stomach, 1 hour before a meal or 2 hours after. 02/10/19   Malachy Mood, MD  meclizine (ANTIVERT) 25 MG tablet Take 25 mg by mouth 3 (three) times daily as needed for dizziness.    [provider]  Multiple Vitamin (MULTIVITAMIN) capsule Take 1 capsule by mouth daily.    [provider]  oxyCODONE-acetaminophen (PERCOCET/ROXICET) 5-325 MG tablet Take 1 tablet by mouth every 4 (four) hours as needed. Patient not taking: Reported on 02/10/2019 11/16/18   Tegeler, Canary Brim, MD   Physical Exam: Blood pressure 113/72, pulse 79, temperature 98.3 F (36.8 C), temperature source Oral, resp. rate 16, height 4\' 11"  (1.499 m), weight 112 kg, last menstrual period 02/02/2019, SpO2 97 %. 1. General:  in No  Acute distress   well  -appearing 2. Psychological: Alert and Oriented 3. Head/ENT:   Moist  Mucous Membranes                          Head Non traumatic, neck supple                          Normal  Dentition 4. SKIN:  decreased Skin turgor,  Skin clean Dry and intact petichial rash noted on lower extremities 5. Heart: Regular rate and rhythm no  Murmur, no Rub or gallop 6. Lungs:   no wheezes or crackles   7. Abdomen: Soft, non-tender, Non distended   present 8. Lower extremities: no clubbing, cyanosis, no  edema 9. Neurologically Grossly intact, moving all 4 extremities equally   10. MSK: Normal range of motion  All other LABS:     Recent Labs  Lab 03/02/19 1705  WBC 9.1  NEUTROABS 4.6  HGB 12.7  HCT 39.1  MCV 88.5  PLT <5*     Recent Labs  Lab 03/02/19 1705  NA 139  K 3.9  CL 106  CO2 24  GLUCOSE 135*  BUN 11  CREATININE 0.63  CALCIUM 9.1     Recent Labs  Lab 03/02/19 1705  AST 17  ALT 18  ALKPHOS 55  BILITOT 0.5  PROT 6.7  ALBUMIN 3.6       Cultures:    Component Value Date/Time   SDES URINE, RANDOM 11/16/2018 1053   SPECREQUEST  11/16/2018 1053    NONE Performed at [facility], [place] [address],  [place], [place] [address]    CULT MULTIPLE SPECIES PRESENT, SUGGEST RECOLLECTION (A) 11/16/2018 1053   REPTSTATUS 11/17/2018 FINAL 11/16/2018 1053     Radiological Exams on Admission: No results found.  Chart has been reviewed    Assessment/Plan  43 y.o. female with medical history significant of ITP, depression, Headache  Admitted for ITP active flare Present on Admission: . Thrombocytopenia ([place]) -hold off on transfusion as soon as able be rapidly consumed. Continue to follow CBC daily .  Continue steroids.  Appreciate hematology consult Save smear for hematology review . Acute ITP (HCC) -treat with steroid doses dexamethasone 40 mg daily   Other plan as per orders.  DVT prophylaxis:  SCD    Code Status:  FULL CODE as per patient   I had personally discussed CODE STATUS with patient    Family Communication:   Family not at  Bedside    Disposition Plan:      To home once workup is complete and patient is stable                                         Consults called: hematology   Admission status:  ED Disposition    ED Disposition Condition Comment   Admit  The patient appears reasonably stabilized for admission considering the current resources, flow, and capabilities available in the ED at this time, and I doubt any other [facility] requiring further screening and/or treatment in the ED prior to admission is  present.          inpatient     Expect 2 midnight stay secondary to severity of patient's current illness including      Severe lab/radiological/exam abnormalities including: Thrombocytopenia   and extensive comorbidities including: . ITP  That are currently affecting medical management.   I expect  patient to be hospitalized for 2 midnights requiring inpatient medical care.  Patient is at high risk for adverse outcome (such as loss of life or disability) if not treated.  Indication for inpatient stay as follows:  Need for repeated blood work and  monitoring for adverse events such as spontaneous bleeding    Level of care      SDU tele indefinitely please discontinue once patient no longer qualifies   Precautions:  asymptomatic screening protocol  No active isolations    PPE: Used by the provider:   P100  eye Goggles,  Gloves    Rhilynn Preyer 03/02/2019, 11:32 PM    [facility]     after 2 AM please page floor coverage PA If 7AM-7PM, please contact the day team taking care of  the patient using [email or web address]

## 2019-03-02 NOTE — ED Triage Notes (Signed)
C/o bleeding from gums this morning and reports "red dots" all over her body.  Reports history of same with low platelets.

## 2019-03-02 NOTE — Consult Note (Signed)
[facility] [phone]   [phone]  INITIAL CONSULT NOTE  Patient Care Team: Gayla Doss., MD as PCP - General (General Practice) Levert Feinstein, MD as Consulting Physician (Oncology) [name] (Hematology and Oncology) Elwin Mocha, MD as Consulting Physician (Ophthalmology) Justice Deeds, MD (Internal Medicine)  Hematological/Oncological History  #Chronic Immune Thrombocytopenic Purpura (ITP) 1) 6-09/2015: nearly 30 day admission for refractory ITP. Underwent treatment with steroids, IVIG, rituximab, and splenectomy.  2) 02/2018: hospitalized for subarachnoid hemorrhage and severe thrombocytosis from ITP. She responded to dex, IVIG, and Nplate. 3) 10/14/18: acute ITP flare. Responded to dex and one dose of Nplate.  4) 10/22/2018: started on promacta  daily by Dr. Mosetta Putt.  5) 02/10/2019: ran out of promacta, reported only missed one day of therapy 6) 03/02/2019: current admission, presented for bleeding gums and petechiae of LE bilaterally.   CHIEF COMPLAINTS/PURPOSE OF CONSULTATION:  Platelet count <5  HISTORY OF PRESENTING ILLNESS:  [name] 43 y.o. female with medical history significant for chronic ITP who presents with bleeding gums and petechiae and was found to have platelets of <5. She is currently followed by Dr. Mosetta Putt and was last seen on 02/10/2019 at which time it was recommended she continue her Promacta as her Plts were stable. Of note it was reported she ran out of medication 1 week prior to that visit.   On review of prior records she was initially diagnosed with ITP in June 2017.  She had a prolonged hospital course of nearly 30 days during which time she underwent treatment with steroids IVIG rituximab and finally splenectomy.  At that time she was followed by Dr. Cyndie Chime.  She subsequently switched hands when Dr. Cyndie Chime left and was followed by Dr. Mosetta Putt.  She has had 2 flares since that  time once in December 2019 at which time she had a subarachnoid hemorrhage and responded to Dex IVIG and endplate and most recently in July 2020 when she had an ITP flare which responded to Dex and a single dose of Nplate.  Since August of this year she has been taking Promacta 25 mg daily.  She has a single missed dose was reported in November 2020.  On exam today the patient reports that she developed petechiae and gum bleeding over the last day.  When she found these symptoms this morning she presented to the emergency department for further evaluation and management.  In the emergency department she had a CBC which showed white blood cell count 9.1 hemoglobin 12.7 and a platelet count less than 5.  Due to having a slight headache she underwent a CT scan of the head which showed no signs of acute intracranial hemorrhage.  Hematology was consulted for further evaluation and management.  On further discussion the patient notes that she has not recently had any illnesses.  She denies any fevers, chills, sweats, nausea, vomiting, or diarrhea.  She denies any runny nose or sore throat.  She has been otherwise in her normal state of health and the only recent medication she is started is vitamin D supplementation.  She has not noted any other overt signs of bleeding including nosebleed, bruising, or dark stools.  Full 10 point ROS is listed below.  MEDICAL HISTORY:  Past Medical History:  Diagnosis Date  . Anemia   . Chronic ITP (idiopathic thrombocytopenia) (HCC)   . Depression   . Headache   . 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 BREAST SURGERY  biopsy  . 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 Marital  status: Married    Spouse name: Not on file  . Number of children: Not on file  . Years of education: Not on file  . Highest education level: Not on file  Occupational History  . Occupation: packs personal products  Tobacco Use  . Smoking status: Never Smoker  . Smokeless tobacco: Never Used  Substance and Sexual Activity  . Alcohol use: No    Alcohol/week: 0.0 standard drinks  . Drug use: No  . Sexual activity: Not on file  Other Topics Concern  . Not on file  Social History Narrative  . Not on file   Social Determinants of Health   Financial Resource Strain:   . Difficulty of Paying Living Expenses: Not on file  Food Insecurity:   . Worried About Programme researcher, broadcasting/film/video in the Last Year: Not on file  . Ran Out of Food in the Last Year: Not on file  Transportation Needs:   . Lack of Transportation (Medical): Not on file  . Lack of Transportation (Non-Medical): Not on file  Physical Activity:   . Days of Exercise per Week: Not on file  . Minutes of Exercise per Session: Not on file  Stress:   . Feeling of Stress : Not on file  Social Connections:   . Frequency of Communication with Friends and Family: Not on file  . Frequency of Social Gatherings with Friends and Family: Not on file  . Attends Religious Services: Not on file  . Active Member of Clubs or Organizations: Not on file  . Attends Banker Meetings: Not on file  . Marital Status: Not on file  Intimate Partner Violence:   . Fear of Current or Ex-Partner: Not on file  . Emotionally Abused: Not on file  . Physically Abused: Not on file  . Sexually Abused: Not on file    FAMILY HISTORY: No family history on file.  ALLERGIES:  has No Known Allergies.  MEDICATIONS:  Current Facility-Administered Medications  Medication Dose Route Frequency Provider Last Rate Last Admin  . dexamethasone (DECADRON) tablet 40 mg  40 mg Oral Once Loren Racer, MD       Current Outpatient Medications  Medication  Sig Dispense Refill  . polyethylene glycol powder (GLYCOLAX/MIRALAX) 17 GM/SCOOP powder Take by mouth.    Marland Kitchen acetaminophen (TYLENOL) 500 MG tablet Take 1-2 tablets (500-1,000 mg total) by mouth every 6 (six) hours as needed for mild pain or moderate pain. (Patient taking differently: Take 500 mg by mouth daily as needed for mild pain or moderate pain. ) 30 tablet 0  . citalopram (CELEXA) 10 MG tablet Take 10 mg by mouth daily.    Marland Kitchen eltrombopag (PROMACTA) 25 MG tablet Take 1 tablet (25 mg total) by mouth daily. Take on an empty stomach, 1 hour before a meal or 2 hours after. 30 tablet 9  . meclizine (ANTIVERT) 25 MG tablet Take 25 mg by mouth 3 (three) times daily as needed for dizziness.    . Multiple Vitamin (MULTIVITAMIN) capsule Take 1 capsule by mouth daily.    Marland Kitchen oxyCODONE-acetaminophen (PERCOCET/ROXICET) 5-325 MG tablet Take 1 tablet  by mouth every 4 (four) hours as needed. (Patient not taking: Reported on 02/10/2019) 15 tablet 0    REVIEW OF SYSTEMS:   Constitutional: ( - ) fevers, ( - )  chills , ( - ) night sweats Eyes: ( - ) blurriness of vision, ( - ) double vision, ( - ) watery eyes Ears, nose, mouth, throat, and face: ( - ) mucositis, ( - ) sore throat Respiratory: ( - ) cough, ( - ) dyspnea, ( - ) wheezes Cardiovascular: ( - ) palpitation, ( - ) chest discomfort, ( - ) lower extremity swelling Gastrointestinal:  ( - ) nausea, ( - ) heartburn, ( - ) change in bowel habits Skin: ( - ) abnormal skin rashes Lymphatics: ( - ) new lymphadenopathy, ( - ) easy bruising Neurological: ( - ) numbness, ( - ) tingling, ( - ) new weaknesses Behavioral/Psych: ( - ) mood change, ( - ) new changes  All other systems were reviewed with the patient and are negative.  PHYSICAL EXAMINATION: ECOG PERFORMANCE STATUS: 0 - Asymptomatic  Vitals:   03/02/19 1614  BP: 113/72  Pulse: 79  Resp: 16  Temp: 98.3 F (36.8 C)  SpO2: 97%   Filed Weights   03/02/19 1614  Weight: 247 lb (112 kg)     GENERAL: well appearing middle aged Hispanic female in NAD  SKIN: petechiae noted over lower extremities bilaterally up to the level of the knee. Few noted on the left wrist. None on the torso or face.  EYES: conjunctiva are pink and non-injected, sclera clear OROPHARYNX: no exudate, no erythema; lips, buccal mucosa, and tongue normal. Mild bleeding between teeth.  LUNGS: clear to auscultation and percussion with normal breathing effort HEART: regular rate & rhythm and no murmurs and no lower extremity edema ABDOMEN: soft, non-tender, non-distended, normal bowel sounds Musculoskeletal: no cyanosis of digits and no clubbing  PSYCH: alert & oriented x 3, fluent speech NEURO: no focal motor/sensory deficits  LABORATORY DATA:  I have reviewed the data as listed Lab Results  Component Value Date   WBC 9.1 03/02/2019   HGB 12.7 03/02/2019   HCT 39.1 03/02/2019   MCV 88.5 03/02/2019   PLT <5 (LL) 03/02/2019   NEUTROABS 4.6 03/02/2019    PATHOLOGY: None to review.  BLOOD FILM: Blood film pending.   RADIOGRAPHIC STUDIES: I have personally reviewed the radiological images as listed and agreed with the findings in the report: no signs of acute intracranial bleeding.   CT Head Wo Contrast  Result Date: 03/02/2019 CLINICAL DATA:  Headache, low platelets, petechiae EXAM: CT HEAD WITHOUT CONTRAST TECHNIQUE: Contiguous axial images were obtained from the base of the skull through the vertex without intravenous contrast. COMPARISON:  CT brain, 10/14/2018 FINDINGS: Brain: No evidence of acute infarction, hemorrhage, hydrocephalus, extra-axial collection or mass lesion/mass effect. There are multiple unchanged small foci of high attenuation in the cerebral hemispheres near the gray-white junction (e.g. series 3, image 21, 19). Vascular: No hyperdense vessel or unexpected calcification. Skull: Normal. Negative for fracture or focal lesion. Sinuses/Orbits: No acute finding. Other: None.  IMPRESSION: No acute intracranial pathology. No evidence of acute intracranial hemorrhage. There are multiple unchanged small foci of high attenuation in the cerebral hemispheres near the gray-white junction (e.g. series 3, image 21, 19), these small calcifications of uncertain etiology and nonacute, perhaps related to prior small junctional hemorrhages. Electronically Signed   By: Lauralyn Primes M.D.   On: 03/02/2019 19:57    ASSESSMENT & PLAN  [name] 43 y.o. female with medical history significant for chronic ITP who presents with bleeding gums and petechiae and was found to have platelets of <5.  On review of her records and with further physical exam her findings are most consistent with an ITP flare.  As such I would recommend treating with a pulse of dexamethasone 40 mg p.o. daily for 4 days starting today.  I would recommend holding on IVIG or Nplate therapy at this time and give her body an opportunity to respond to the steroid pulse.  Given she only has mild petechiae as well as modest gum bleeding I would recommend holding on transfusion of platelets at this time.  Furthermore I would recommend daily CBC and CMP with no more frequent checks in order to assess for rise in the platelet count.  CT scan of her head is reassuring, however in the event she were developing new or worsening headache I would recommend immediate rescan.  Our service will continue to follow and Dr. Burr Medico will assume her care beginning tomorrow morning.  #Chronic Immune Thrombocytopenic Purpura (ITP) with Active Flare --recommend starting dexamethasone 40mg  PO x 4 days starting today --will hold on IVIG at this time. Can consider starting this if the platelet count does not respond to steroid dosing in the first 24-48 hours.  --do not recommend platelet transfusion at this time as it would rapidly be consumed. Do not transfuse unless signs of serious active bleeding. --please hold promacta while patient is  inpatient and receiving steroid pulse.  --please continue to order daily morning CBCs and CMP. There is no need for more frequent checks at this time. Also please order a peripheral blood film for [place] to review. --hematology service will continue to follow. Dr. Burr Medico will assume the patients care tomorrow AM.  --please do not hesitate to call the hematology pager if any questions or concerns arise during the course of this patient's care.   Orders Placed This Encounter  Procedures  . SARS CORONAVIRUS 2 (TAT 6-24 HRS) Nasopharyngeal Nasopharyngeal Swab    Standing Status:   Standing    Number of Occurrences:   1    Order Specific Question:   Is this test for diagnosis or screening    Answer:   Screening    Order Specific Question:   Symptomatic for COVID-19 as defined by CDC    Answer:   No    Order Specific Question:   Hospitalized for COVID-19    Answer:   No    Order Specific Question:   Admitted to ICU for COVID-19    Answer:   No    Order Specific Question:   Previously tested for COVID-19    Answer:   Yes    Order Specific Question:   Resident in a congregate (group) care setting    Answer:   No    Order Specific Question:   Employed in healthcare setting    Answer:   No    Order Specific Question:   Pregnant    Answer:   No  . CT Head Wo Contrast    Standing Status:   Standing    Number of Occurrences:   1    Order Specific Question:   Is patient pregnant?    Answer:   Unknown (Please Explain)  . CBC with Differential    Standing Status:   Standing    Number of Occurrences:   1  . Protime-INR    Standing Status:  Standing    Number of Occurrences:   1  . Comprehensive metabolic panel    Standing Status:   Standing    Number of Occurrences:   1  . Consult to oncology  ALL PATIENTS BEING ADMITTED/HAVING PROCEDURES NEED COVID-19 SCREENING    ALL PATIENTS BEING ADMITTED/HAVING PROCEDURES NEED COVID-19 SCREENING    Standing Status:   Standing    Number of Occurrences:   1     Order Specific Question:   Place call to:    Answer:   on call    Order Specific Question:   Reason for Consult    Answer:   Consult  . Consult for Unassigned Medical Admission  ALL PATIENTS BEING ADMITTED/HAVING PROCEDURES NEED COVID-19 SCREENING    ALL PATIENTS BEING ADMITTED/HAVING PROCEDURES NEED COVID-19 SCREENING    Standing Status:   Standing    Number of Occurrences:   1    Order Specific Question:   Place call to:    Answer:   on call for unassigned    Order Specific Question:   Reason for Consult    Answer:   Admit  . I-Stat Beta hCG blood, ED ([facility], [facility], [facility] only)    Standing Status:   Standing    Number of Occurrences:   1     A total of more than 40 minutes were spent on this encounter and over half of that time was spent on counseling and coordination of care as outlined above.   Ulysees BarnsJohn T. Paul Trettin, MD Department of Hematology/Oncology [facility] Phone: [phone] Pager: [phone] Email: [email or web address]  03/02/2019 8:00 PM

## 2019-03-03 LAB — TYPE AND SCREEN
ABO/RH(D): O POS
Antibody Screen: NEGATIVE

## 2019-03-03 LAB — CBC
HCT: 40.4 % (ref 36.0–46.0)
Hemoglobin: 13.3 g/dL (ref 12.0–15.0)
MCH: 28.4 pg (ref 26.0–34.0)
MCHC: 32.9 g/dL (ref 30.0–36.0)
MCV: 86.1 fL (ref 80.0–100.0)
Platelets: <5 10*3/uL — CL (ref 150–400)
RBC: 4.69 MIL/uL (ref 3.87–5.11)
RDW: 14.2 % (ref 11.5–15.5)
WBC: 14.3 10*3/uL — ABNORMAL HIGH (ref 4.0–10.5)
nRBC: 0 % (ref 0.0–0.2)

## 2019-03-03 LAB — COMPREHENSIVE METABOLIC PANEL
ALT: 22 U/L (ref 0–44)
AST: 25 U/L (ref 15–41)
Albumin: 3.9 g/dL (ref 3.5–5.0)
Alkaline Phosphatase: 54 U/L (ref 38–126)
Anion gap: 12 (ref 5–15)
BUN: 12 mg/dL (ref 6–20)
CO2: 21 mmol/L — ABNORMAL LOW (ref 22–32)
Calcium: 9.1 mg/dL (ref 8.9–10.3)
Chloride: 104 mmol/L (ref 98–111)
Creatinine, Ser: 0.69 mg/dL (ref 0.44–1.00)
GFR calc Af Amer: >60 mL/min (ref >60)
GFR calc non Af Amer: >60 mL/min (ref >60)
Glucose, Bld: 197 mg/dL — ABNORMAL HIGH (ref 70–99)
Potassium: 4 mmol/L (ref 3.5–5.1)
Sodium: 137 mmol/L (ref 135–145)
Total Bilirubin: 0.2 mg/dL — ABNORMAL LOW (ref 0.3–1.2)
Total Protein: 7.1 g/dL (ref 6.5–8.1)

## 2019-03-03 LAB — MAGNESIUM: Magnesium: 2.1 mg/dL (ref 1.7–2.4)

## 2019-03-03 LAB — SARS CORONAVIRUS 2 (TAT 6-24 HRS): SARS Coronavirus 2: NEGATIVE

## 2019-03-03 LAB — SAVE SMEAR(SSMR), FOR PROVIDER SLIDE REVIEW

## 2019-03-03 LAB — TSH: TSH: 1.089 u[IU]/mL (ref 0.350–4.500)

## 2019-03-03 LAB — PHOSPHORUS: Phosphorus: 2.2 mg/dL — ABNORMAL LOW (ref 2.5–4.6)

## 2019-03-03 MED ORDER — ROMIPLOSTIM 250 MCG ~~LOC~~ SOLR
2.0000 ug/kg | Freq: Once | SUBCUTANEOUS | Status: AC
  Administered 2019-03-03: 220 ug via SUBCUTANEOUS
  Filled 2019-03-03: qty 0.44

## 2019-03-03 MED ORDER — INFLUENZA VAC SPLIT QUAD 0.5 ML IM SUSY
0.5000 mL | PREFILLED_SYRINGE | INTRAMUSCULAR | Status: AC
  Administered 2019-03-06: 0.5 mL via INTRAMUSCULAR
  Filled 2019-03-03: qty 0.5

## 2019-03-03 NOTE — ED Notes (Signed)
Per Dr. Tana Coast RN to continue to monitor for bleeding. PLT transfusion not indicated at this time d/t stable H&H. RN to report any abnormal observations

## 2019-03-03 NOTE — ED Notes (Signed)
ED TO INPATIENT HANDOFF REPORT  ED Nurse Name and Phone #:  [phone]  S Name/Age/Gender [name] 43 y.o. female Room/Bed: 026C/026C  Code Status   Code Status: Full Code  Home/SNF/Other Home Patient oriented to: self, place, time and situation Is this baseline? Yes   Triage Complete: Triage complete  Chief Complaint Thrombocytopenia (HCC) [D69.6]  Triage Note C/o bleeding from gums this morning and reports "red dots" all over her body.  Reports history of same with low platelets.      Allergies No Known Allergies  Level of Care/Admitting Diagnosis ED Disposition    ED Disposition Condition Comment   Admit  [facility] [[id number]]  Level of Care: Progressive [102]  Admit to Progressive based on following criteria: Other see comments  Comments: severe  thrombocytopenia  Covid Evaluation: Asymptomatic Screening Protocol (No Symptoms)  Diagnosis: Thrombocytopenia (HCC) [098119][242250]  Admitting Physician: Therisa DoyneUTOVA, ANASTASSIA [[id number]]  Attending Physician: Therisa DoyneUTOVA, ANASTASSIA [[id number]]  Estimated length of stay: 3 - 4 days  Certification:: I certify this patient will need inpatient services for at least 2 midnights       B Medical/Surgery History Past Medical History:  Diagnosis Date  . Anemia   . Chronic ITP (idiopathic thrombocytopenia) (HCC)   . Depression   . Headache   . 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 BREAST SURGERY  biopsy  . 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   09/19/15    1600     1261          Intake/Output Last 24 hours No intake or output data in the 24 hours ending 03/03/19 [id number]  Labs/Imaging Results for orders placed or performed during the hospital encounter of 03/02/19 (from the past 48 hour(s))  CBC with Differential     Status: Abnormal   Collection Time: 03/02/19  5:05 PM  Result Value Ref Range   WBC 9.1 4.0 - 10.5 K/uL   RBC 4.42 3.87 - 5.11 MIL/uL   Hemoglobin 12.7 12.0 - 15.0 g/dL   HCT 29.539.1 62.136.0 - 30.846.0 %   MCV 88.5 80.0 - 100.0 fL   MCH 28.7 26.0 - 34.0 pg   MCHC 32.5 30.0 - 36.0 g/dL   RDW 65.714.2 84.611.5 - 96.215.5 %   Platelets <5 (LL) 150 - 400 K/uL    Comment: REPEATED TO VERIFY SPECIMEN CHECKED FOR CLOTS RARE PLATELET NOTED ON SMEAR CRITICAL RESULT CALLED TO, READ BACK BY AND VERIFIED WITH: RN K NEWNAM AT 1747 03/02/2019 BY L BENFIELD CORRECTED ON 12/13 AT 1750: PREVIOUSLY REPORTED AS 3 REPEATED TO VERIFY SPECIMEN CHECKED FOR CLOTS Immature Platelet Fraction may be clinically indicated, consider ordering this additional test [id number] RARE PLATELET NOTED ON SMEAR  nRBC 0.0 0.0 - 0.2 %   Neutrophils Relative % 50 %   Neutro Abs 4.6 1.7 - 7.7 K/uL   Lymphocytes Relative 38 %   Lymphs Abs 3.4 0.7 - 4.0 K/uL   Monocytes Relative 9 %   Monocytes Absolute 0.8 0.1 - 1.0 K/uL   Eosinophils Relative 2 %   Eosinophils Absolute 0.2 0.0 - 0.5 K/uL   Basophils Relative 0 %   Basophils Absolute 0.0 0.0 - 0.1 K/uL   Immature Granulocytes 1 %   Abs Immature Granulocytes 0.06 0.00 - 0.07 K/uL    Comment: Performed at [facility], [address], [place], [place] [address]  Protime-INR     Status: None   Collection Time: 03/02/19  5:05 PM  Result Value Ref Range   Prothrombin Time 12.7 11.4 - 15.2 seconds   INR 1.0 0.8 -  1.2    Comment: (NOTE) INR goal varies based on device and disease states. Performed at [facility], [address], [place], [place] [address]   Comprehensive metabolic panel     Status: Abnormal   Collection Time: 03/02/19  5:05 PM  Result Value Ref Range   Sodium 139 135 - 145 mmol/L   Potassium 3.9 3.5 - 5.1 mmol/L   Chloride 106 98 - 111 mmol/L   CO2 24 22 - 32 mmol/L   Glucose, Bld 135 (H) 70 - 99 mg/dL   BUN 11 6 - 20 mg/dL   Creatinine, Ser 7.10 0.44 - 1.00 mg/dL   Calcium 9.1 8.9 - 62.6 mg/dL   Total Protein 6.7 6.5 - 8.1 g/dL   Albumin 3.6 3.5 - 5.0 g/dL   AST 17 15 - 41 U/L   ALT 18 0 - 44 U/L   Alkaline Phosphatase 55 38 - 126 U/L   Total Bilirubin 0.5 0.3 - 1.2 mg/dL   GFR calc non Af Amer >60 >60 mL/min   GFR calc Af Amer >60 >60 mL/min   Anion gap 9 5 - 15    Comment: Performed at [facility], [address] [place], [place] [address]  Save Smear     Status: None   Collection Time: 03/02/19  5:05 PM  Result Value Ref Range   Smear Review SMEAR STAINED AND AVAILABLE FOR REVIEW     Comment: Performed at [facility], [address] [place], [place] [address]  I-Stat Beta hCG blood, ED ([facility], [facility], [facility] only)     Status: None   Collection Time: 03/02/19  8:32 PM  Result Value Ref Range   I-stat hCG, quantitative <5.0 <5 mIU/mL   Comment 3            Comment:   GEST. AGE      CONC.  (mIU/mL)   <=1 WEEK        5 - 50     2 WEEKS       50 - 500     3 WEEKS       100 - 10,000     4 WEEKS     1,000 - 30,000        FEMALE AND NON-PREGNANT FEMALE:     LESS THAN 5 mIU/mL   SARS CORONAVIRUS 2 (TAT 6-24 HRS) Nasopharyngeal Nasopharyngeal Swab     Status: None   Collection Time: 03/02/19 10:20 PM   Specimen: Nasopharyngeal Swab  Result Value Ref Range   SARS Coronavirus 2 NEGATIVE NEGATIVE  Comment: (NOTE) SARS-CoV-2 target nucleic acids are NOT DETECTED. The SARS-CoV-2 RNA is generally detectable in upper and lower respiratory specimens during  the acute phase of infection. Negative results do not preclude SARS-CoV-2 infection, do not rule out co-infections with other pathogens, and should not be used as the sole basis for treatment or other patient management decisions. Negative results must be combined with clinical observations, patient history, and epidemiological information. The expected result is Negative. Fact Sheet for Patients: SugarRoll.be Fact Sheet for Healthcare Providers: https://www.woods-mathews.com/ This test is not yet approved or cleared by the [place] FDA and  has been authorized for detection and/or diagnosis of SARS-CoV-2 by FDA under an Emergency Use Authorization (EUA). This EUA will remain  in effect (meaning this test can be used) for the duration of the COVID-19 declaration under Section 56 4(b)(1) of the Act, 21 U.S.C. section 360bbb-3(b)(1), unless the authorization is terminated or revoked sooner. Performed at [facility], [place] [address], [place], [place] [address]   Type and screen [place]     Status: None   Collection Time: 03/03/19 12:25 AM  Result Value Ref Range   ABO/RH(D) O POS    Antibody Screen NEG    Sample Expiration      03/06/2019,2359 Performed at [facility], [place] [address] [place], [place] [address]   CBC     Status: Abnormal   Collection Time: 03/03/19  1:17 AM  Result Value Ref Range   WBC 14.3 (H) 4.0 - 10.5 K/uL   RBC 4.69 3.87 - 5.11 MIL/uL   Hemoglobin 13.3 12.0 - 15.0 g/dL   HCT 40.4 36.0 - 46.0 %   MCV 86.1 80.0 - 100.0 fL   MCH 28.4 26.0 - 34.0 pg   MCHC 32.9 30.0 - 36.0 g/dL   RDW 14.2 11.5 - 15.5 %   Platelets 3 (LL) 150 - 400 K/uL    Comment: REPEATED TO VERIFY SPECIMEN CHECKED FOR CLOTS Immature Platelet Fraction may be clinically indicated, consider ordering this additional test [id number] CRITICAL VALUE NOTED.  VALUE IS CONSISTENT WITH PREVIOUSLY REPORTED AND CALLED  VALUE.    nRBC 0.0 0.0 - 0.2 %    Comment: Performed at [facility], [place] [address], [place], [place] [address]  Magnesium     Status: None   Collection Time: 03/03/19  2:40 AM  Result Value Ref Range   Magnesium 2.1 1.7 - 2.4 mg/dL    Comment: Performed at [place] [address], [place], [place] [address]  Phosphorus     Status: Abnormal   Collection Time: 03/03/19  2:40 AM  Result Value Ref Range   Phosphorus 2.2 (L) 2.5 - 4.6 mg/dL    Comment: Performed at [facility] [address] [place], [place] [address]  TSH     Status: None   Collection Time: 03/03/19  2:40 AM  Result Value Ref Range   TSH 1.089 0.350 - 4.500 uIU/mL    Comment: Performed by a 3rd Generation assay with a functional sensitivity of <=0.01 uIU/mL. Performed at [facility], [place] [address], [place], [place] [address]   Comprehensive metabolic panel     Status: Abnormal   Collection Time: 03/03/19  2:40 AM  Result Value Ref Range   Sodium 137 135 - 145 mmol/L   Potassium 4.0 3.5 - 5.1 mmol/L   Chloride 104 98 - 111 mmol/L   CO2 21 (L) 22 -  32 mmol/L   Glucose, Bld 197 (H) 70 - 99 mg/dL   BUN 12 6 - 20 mg/dL   Creatinine, Ser 1.61 0.44 - 1.00 mg/dL   Calcium 9.1 8.9 - 09.6 mg/dL   Total Protein 7.1 6.5 - 8.1 g/dL   Albumin 3.9 3.5 - 5.0 g/dL   AST 25 15 - 41 U/L   ALT 22 0 - 44 U/L   Alkaline Phosphatase 54 38 - 126 U/L   Total Bilirubin 0.2 (L) 0.3 - 1.2 mg/dL   GFR calc non Af Amer >60 >60 mL/min   GFR calc Af Amer >60 >60 mL/min   Anion gap 12 5 - 15    Comment: Performed at [facility], [address], [place], [place] [address]   CT Head Wo Contrast  Result Date: 03/02/2019 CLINICAL DATA:  Headache, low platelets, petechiae EXAM: CT HEAD WITHOUT CONTRAST TECHNIQUE: Contiguous axial images were obtained from the base of the skull through the vertex without intravenous contrast. COMPARISON:  CT brain, 10/14/2018 FINDINGS: Brain: No evidence of acute  infarction, hemorrhage, hydrocephalus, extra-axial collection or mass lesion/mass effect. There are multiple unchanged small foci of high attenuation in the cerebral hemispheres near the gray-white junction (e.g. series 3, image 21, 19). Vascular: No hyperdense vessel or unexpected calcification. Skull: Normal. Negative for fracture or focal lesion. Sinuses/Orbits: No acute finding. Other: None. IMPRESSION: No acute intracranial pathology. No evidence of acute intracranial hemorrhage. There are multiple unchanged small foci of high attenuation in the cerebral hemispheres near the gray-white junction (e.g. series 3, image 21, 19), these small calcifications of uncertain etiology and nonacute, perhaps related to prior small junctional hemorrhages. Electronically Signed   By: Lauralyn Primes M.D.   On: 03/02/2019 19:57    Pending Labs Unresulted Labs (From admission, onward)    Start     Ordered   03/02/19 2335  Pathologist smear review  Once,   STAT    Comments: Save smear for heamtology   Question:  Release to patient  Answer:  Immediate   03/02/19 2335          Vitals/Pain Today's Vitals   03/03/19 0200 03/03/19 0218 03/03/19 0219 03/03/19 0400  BP: 112/66   112/67  Pulse: 80   73  Resp: (!) 21   16  Temp:  98.3 F (36.8 C)    TempSrc:  Oral    SpO2: 96%   96%  Weight:      Height:      PainSc:   0-No pain     Isolation Precautions No active isolations  Medications Medications  dexamethasone (DECADRON) tablet 40 mg (has no administration in time range)  citalopram (CELEXA) tablet 10 mg (has no administration in time range)  acetaminophen (TYLENOL) tablet 650 mg (has no administration in time range)    Or  acetaminophen (TYLENOL) suppository 650 mg (has no administration in time range)  HYDROcodone-acetaminophen (NORCO/VICODIN) 5-325 MG per tablet 1-2 tablet (has no administration in time range)  ondansetron (ZOFRAN) tablet 4 mg (has no administration in time range)    Or   ondansetron (ZOFRAN) injection 4 mg (has no administration in time range)  sodium chloride flush (NS) 0.9 % injection 3 mL (3 mLs Intravenous Given 03/03/19 0012)  sodium chloride flush (NS) 0.9 % injection 3 mL (has no administration in time range)  0.9 %  sodium chloride infusion (has no administration in time range)  dexamethasone (DECADRON) tablet 40 mg (40 mg Oral Given 03/02/19  2036)    Mobility walks Low fall risk   Focused Assessments -   R Recommendations: See Admitting Provider Note  Report given to:   Additional Notes: -

## 2019-03-03 NOTE — Progress Notes (Signed)
Returned to unit to speak with patient's nurse about placing a consult to interpreter.  Nurse said instead she would alert patient's daughter that AD was in room and mother/daughter could go over it together.  As nurse to page Chaplain when ready for notary.  De Burrs Chaplain Resident

## 2019-03-03 NOTE — Progress Notes (Addendum)
HEMATOLOGY-ONCOLOGY PROGRESS NOTE  SUBJECTIVE: Patient was seen with the assistance of Spanish video interpreter. Denies bleeding today. Has noted rash and bruising to her arms and legs. Headache improved today. No complaints of dizziness. Denies missed doses of Promacta prior to admission.   REVIEW OF SYSTEMS:   A 10 point ROS was negative except as noted in the HPI.   I have reviewed the past medical history, past surgical history, social history and family history with the patient and they are unchanged from previous note.   PHYSICAL EXAMINATION: ECOG PERFORMANCE STATUS: 0 - Asymptomatic  Vitals:   03/03/19 0806 03/03/19 1225  BP: (!) 130/93 114/61  Pulse: (!) 106 86  Resp: 18 11  Temp: 98.8 F (37.1 C) 98.4 F (36.9 C)  SpO2: 94% 98%   Filed Weights   03/02/19 1614 03/03/19 0529  Weight: 247 lb (112 kg) 242 lb 4.6 oz (109.9 kg)    Intake/Output from previous day: No intake/output data recorded.  GENERAL:alert, no distress and comfortable SKIN: Petechiae noted to bilateral legs and arms. Scattered ecchymoses noted to arms and legs.  OROPHARYNX: No oral lesions or bleeding LUNGS: clear to auscultation and percussion with normal breathing effort HEART: regular rate & rhythm and no murmurs and no lower extremity edema ABDOMEN:abdomen soft, non-tender and normal bowel sounds Musculoskeletal:no cyanosis of digits and no clubbing  NEURO: alert & oriented x 3 with fluent speech, no focal motor/sensory deficits  LABORATORY DATA:  I have reviewed the data as listed CMP Latest Ref Rng & Units 03/03/2019 03/02/2019 11/16/2018  Glucose 70 - 99 mg/dL 998(P) 382(N) 053(Z)  BUN 6 - 20 mg/dL 12 11 10   Creatinine 0.44 - 1.00 mg/dL 7.67 3.41  Sodium 135 - 145 mmol/L 137 139 138  Potassium 3.5 - 5.1 mmol/L 4.0 3.9 3.7  Chloride 98 - 111 mmol/L 104 106 103  CO2 22 - 32 mmol/L 21(L) 24 25  Calcium 8.9 - 10.3 mg/dL 9.1 9.1 9.1  Total Protein 6.5 - 8.1 g/dL 7.1 6.7 6.4(L)  Total  Bilirubin 0.3 - 1.2 mg/dL 9.37) 0.5 0.6  Alkaline Phos 38 - 126 U/L 54 55 52  AST 15 - 41 U/L 25 17 23   ALT 0 - 44 U/L 22 18 20     Lab Results  Component Value Date   WBC 14.3 (H) 03/03/2019   HGB 13.3 03/03/2019   HCT 40.4 03/03/2019   MCV 86.1 03/03/2019   PLT 3 (LL) 03/03/2019   NEUTROABS 4.6 03/02/2019    CT Head Wo Contrast  Result Date: 03/02/2019 CLINICAL DATA:  Headache, low platelets, petechiae EXAM: CT HEAD WITHOUT CONTRAST TECHNIQUE: Contiguous axial images were obtained from the base of the skull through the vertex without intravenous contrast. COMPARISON:  CT brain, 10/14/2018 FINDINGS: Brain: No evidence of acute infarction, hemorrhage, hydrocephalus, extra-axial collection or mass lesion/mass effect. There are multiple unchanged small foci of high attenuation in the cerebral hemispheres near the gray-white junction (e.g. series 3, image 21, 19). Vascular: No hyperdense vessel or unexpected calcification. Skull: Normal. Negative for fracture or focal lesion. Sinuses/Orbits: No acute finding. Other: None. IMPRESSION: No acute intracranial pathology. No evidence of acute intracranial hemorrhage. There are multiple unchanged small foci of high attenuation in the cerebral hemispheres near the gray-white junction (e.g. series 3, image 21, 19), these small calcifications of uncertain etiology and nonacute, perhaps related to prior small junctional hemorrhages. Electronically Signed   By: 03/04/2019 M.D.   On: 03/02/2019 19:57  ASSESSMENT AND PLAN: 1.  Recurrent ITP 2.  History of small foci of subarachnoid hemorrhage  -Platelet count is 3,000 today. She has no bleeding. No need for platelet transfusion unless she develops active bleeding. Continue Dexamethasone 40 mg daily. Will give N-plate 2 mcg/kg today. She has responded quickly to this medication in the past. Platelet count has been stable prior to this admission on Promacta at home. May need to increase dose of this  medication upon discharge.  -Has history of subarachnoid hemorrhage. Had headache on admission which has improved. CT of head did not show evidence of acute intracranial hemorrhage.     LOS: 1 day   Mikey Bussing, DNP, AGPCNP-BC, AOCNP 03/03/19  Addendum  Pt is well-known to me, recurrent ITP with severe thrombocytopenia and intracranial bleeding, s/p splenectomy, previously treated with steroid, IVIG, Nplate, Rituxan and chemo due to refractory disease.  I have started her on Promacta since last ITP flare.  She presented with mild bleeding and severe thrombocytopenia again.  We will continue dexamethasone 40 mg daily for 4 days, and we have started her on Nplate 75mcg/mg today, and hold on Promacta. She usually responds well to this, although it will take 2-3 days to see improvement. She is currently not bleeding, will hold on platelet transfusion. I plan to increase Promacta dose on discharge. Will f/u.   Truitt Merle  03/03/2019

## 2019-03-03 NOTE — Progress Notes (Signed)
PROGRESS NOTE    [name]  ZOX:[id number] DOB: 11/13/1975 DOA: 03/02/2019 PCP: [name], [name], MD   Brief Narrative:  [name] is a 43 y.o. female with medical history significant of ITP, depression, Headache Presented with bleeding gums and petechiae all over her body and light headache and some dizziness.  No other complaint.  She was found to have platelets of only 5 in the ED.  CT head was done with no evidence of acute bleeding.  She was hospitalized under hospitalist service and oncology was consulted.  She was started on dexamethasone 40 mg p.o. 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  Assessment & Plan:   Active Problems:   Acute ITP (HCC)   Subarachnoidal hemorrhage (HCC)   Thrombocytopenia (HCC)  Acute flareup of chronic ITP: Presented with platelets of 5.  No more active bleeding.  It dropped to 3.  Oncology saw patient.  Started on dexamethasone 40 mg p.o. daily for 5 days.  Plan for IVIG if no improvement.  Transfuse platelets only if active bleeding per oncology recommendations.  Depression: Continue Celexa.  DVT prophylaxis: SCD/avoiding heparin Plavix due to ITP/thrombocytopenia Code Status: Inpatient Family Communication: Daughter present at bedside.  She functioned as Equities traderinterpreter.  Discussed in length with daughter and answered questions.  Disposition Plan: To be determined  Estimated body mass index is 48.94 kg/m as calculated from the following:   Height as of this encounter: 4\' 11"  (1.499 m).   Weight as of this encounter: 109.9 kg.  Pressure Ulcer 09/19/15 Stage I -  Intact skin with non-blanchable redness of a localized area usually over a bony prominence. (Active)  09/19/15 1600  Location: Buttocks  Location Orientation: Left  Staging: Stage I -  Intact skin with non-blanchable redness of a localized area usually over a bony prominence.  Wound Description (Comments):   Present on Admission: No     Nutritional status:                Consultants:   Heme/oncology   Procedures:   None  Antimicrobials:   None   Subjective: Seen and examined.  Daughter at the bedside functioning as interpreter.  Patient denies any new complaints.  Some minor intermittent gum bleeding.  Objective: Vitals:   03/03/19 0400 03/03/19 0527 03/03/19 0529 03/03/19 0806  BP: 112/67 110/65  (!) 130/93  Pulse: 73   (!) 106  Resp: 16 11  18   Temp:  98.9 F (37.2 C)  98.8 F (37.1 C)  TempSrc:  Oral  Oral  SpO2: 96% 97%  94%  Weight:   109.9 kg   Height:   4\' 11"  (1.499 m)    No intake or output data in the 24 hours ending 03/03/19 1016 Filed Weights   03/02/19 1614 03/03/19 0529  Weight: 112 kg 109.9 kg    Examination:  General exam: Appears calm and comfortable  Respiratory system: Clear to auscultation. Respiratory effort normal. Cardiovascular system: S1 & S2 heard, RRR. No JVD, murmurs, rubs, gallops or clicks. No pedal edema. Gastrointestinal system: Abdomen is nondistended, soft and nontender. No organomegaly or masses felt. Normal bowel sounds heard. Central nervous system: Alert and oriented. No focal neurological deficits. Extremities: Symmetric 5 x 5 power. Skin: No rashes, lesions or ulcers Psychiatry: Judgement and insight appear normal. Mood & affect appropriate.    Data Reviewed: I have personally reviewed following labs and imaging studies  CBC: Recent Labs  Lab 03/02/19 1705 03/03/19 0117  WBC 9.1 14.3*  NEUTROABS  4.6  --   HGB 12.7 13.3  HCT 39.1 40.4  MCV 88.5 86.1  PLT <5* 3*   Basic Metabolic Panel: Recent Labs  Lab 03/02/19 1705 03/03/19 0240  NA 139 137  K 3.9 4.0  CL 106 104  CO2 24 21*  GLUCOSE 135* 197*  BUN 11 12  CREATININE 0.63 0.69  CALCIUM 9.1 9.1  MG  --  2.1  PHOS  --  2.2*   GFR: Estimated Creatinine Clearance: 99 mL/min (by C-G formula based on SCr of 0.69 mg/dL). Liver Function Tests: Recent Labs  Lab 03/02/19 1705 03/03/19 0240  AST 17  25  ALT 18 22  ALKPHOS 55 54  BILITOT 0.5 0.2*  PROT 6.7 7.1  ALBUMIN 3.6 3.9   No results for input(s): LIPASE, AMYLASE in the last 168 hours. No results for input(s): AMMONIA in the last 168 hours. Coagulation Profile: Recent Labs  Lab 03/02/19 1705  INR 1.0   Cardiac Enzymes: No results for input(s): CKTOTAL, CKMB, CKMBINDEX, TROPONINI in the last 168 hours. BNP (last 3 results) No results for input(s): PROBNP in the last 8760 hours. HbA1C: No results for input(s): HGBA1C in the last 72 hours. CBG: No results for input(s): GLUCAP in the last 168 hours. Lipid Profile: No results for input(s): CHOL, HDL, LDLCALC, TRIG, CHOLHDL, LDLDIRECT in the last 72 hours. Thyroid Function Tests: Recent Labs    03/03/19 0240  TSH 1.089   Anemia Panel: No results for input(s): VITAMINB12, FOLATE, FERRITIN, TIBC, IRON, RETICCTPCT in the last 72 hours. Sepsis Labs: No results for input(s): PROCALCITON, LATICACIDVEN in the last 168 hours.  Recent Results (from the past 240 hour(s))  SARS CORONAVIRUS 2 (TAT 6-24 HRS) Nasopharyngeal Nasopharyngeal Swab     Status: None   Collection Time: 03/02/19 10:20 PM   Specimen: Nasopharyngeal Swab  Result Value Ref Range Status   SARS Coronavirus 2 NEGATIVE NEGATIVE Final    Comment: (NOTE) SARS-CoV-2 target nucleic acids are NOT DETECTED. The SARS-CoV-2 RNA is generally detectable in upper and lower respiratory specimens during the acute phase of infection. Negative results do not preclude SARS-CoV-2 infection, do not rule out co-infections with other pathogens, and should not be used as the sole basis for treatment or other patient management decisions. Negative results must be combined with clinical observations, patient history, and epidemiological information. The expected result is Negative. Fact Sheet for Patients: SugarRoll.be Fact Sheet for Healthcare  Providers: https://www.woods-mathews.com/ This test is not yet approved or cleared by the [place] FDA and  has been authorized for detection and/or diagnosis of SARS-CoV-2 by FDA under an Emergency Use Authorization (EUA). This EUA will remain  in effect (meaning this test can be used) for the duration of the COVID-19 declaration under Section 56 4(b)(1) of the Act, 21 U.S.C. section 360bbb-3(b)(1), unless the authorization is terminated or revoked sooner. Performed at [facility], [place] [address], [place], [place] [address]       Radiology Studies: CT Head Wo Contrast  Result Date: 03/02/2019 CLINICAL DATA:  Headache, low platelets, petechiae EXAM: CT HEAD WITHOUT CONTRAST TECHNIQUE: Contiguous axial images were obtained from the base of the skull through the vertex without intravenous contrast. COMPARISON:  CT brain, 10/14/2018 FINDINGS: Brain: No evidence of acute infarction, hemorrhage, hydrocephalus, extra-axial collection or mass lesion/mass effect. There are multiple unchanged small foci of high attenuation in the cerebral hemispheres near the gray-white junction (e.g. series 3, image 21, 19). Vascular: No hyperdense vessel or unexpected calcification.  Skull: Normal. Negative for fracture or focal lesion. Sinuses/Orbits: No acute finding. Other: None. IMPRESSION: No acute intracranial pathology. No evidence of acute intracranial hemorrhage. There are multiple unchanged small foci of high attenuation in the cerebral hemispheres near the gray-white junction (e.g. series 3, image 21, 19), these small calcifications of uncertain etiology and nonacute, perhaps related to prior small junctional hemorrhages. Electronically Signed   By: Lauralyn Primes M.D.   On: 03/02/2019 19:57    Scheduled Meds: . citalopram  10 mg Oral Daily  . dexamethasone  40 mg Oral Daily  . [START ON 03/04/2019] influenza vac split quadrivalent PF  0.5 mL Intramuscular Tomorrow-1000  . sodium  chloride flush  3 mL Intravenous Q12H   Continuous Infusions: . sodium chloride       LOS: 1 day   Time spent: 31 minutes   Hughie Closs, MD [facility]  03/03/2019, 10:16 AM   To contact the attending provider between 7A-7P or the covering provider during after hours 7P-7A, please log into the web site [email or web address] and use password [email or web address].

## 2019-03-03 NOTE — ED Notes (Signed)
Date and time results received: 03/03/19  2:39 AM  Test: PLT Critical Value: 3   Name of Provider Notified: Rai  Provider to review lab work and place orders

## 2019-03-03 NOTE — Plan of Care (Signed)
Called by RN for platelets trending down, now 3K, was admitted  for chronic ITP with acute flare, platelets <5K  Discussed with hematology on-call, Dr. Lorenso Courier, who has evaluated the patient already.  Dr. Lorenso Courier recommended no platelet transfusion at this time, continue to monitor.  As long as patient is not actively bleeding and H/H is stable, hold off on platelet transfusion.  Continue prednisone, will likely take 24 to 48 hours for the platelets to stabilize.  Follow hematology recommendations.  Discussed Dr. Libby Maw recommendations with RN.   Estill Cotta M.D. [facility] 03/03/2019, 2:49 AM  Pager: [phone]

## 2019-03-03 NOTE — Progress Notes (Signed)
Responded to Spiritual Consult for AD.  Entering the room I found it difficult to communicate with [name] due to language differences.  Explained I was there with AD and asked if she understood what that was. Not sure she understands so asked unit Network engineer for Spanish version of AD.  Looking over it I realized I was unable to infer what each section means so unable to explain it.  Left document in patient's room.  Suggest have Spanish interpreter to explain.  Please reach out to [facility] when ready to notarize.  De Burrs Chaplain Resident

## 2019-03-03 NOTE — Plan of Care (Signed)

## 2019-03-04 DIAGNOSIS — D693 Immune thrombocytopenic purpura: Principal | ICD-10-CM

## 2019-03-04 DIAGNOSIS — D696 Thrombocytopenia, unspecified: Secondary | ICD-10-CM

## 2019-03-04 LAB — BASIC METABOLIC PANEL
Anion gap: 7 (ref 5–15)
BUN: 15 mg/dL (ref 6–20)
CO2: 22 mmol/L (ref 22–32)
Calcium: 9.3 mg/dL (ref 8.9–10.3)
Chloride: 107 mmol/L (ref 98–111)
Creatinine, Ser: 0.68 mg/dL (ref 0.44–1.00)
GFR calc Af Amer: >60 mL/min (ref >60)
GFR calc non Af Amer: >60 mL/min (ref >60)
Glucose, Bld: 231 mg/dL — ABNORMAL HIGH (ref 70–99)
Potassium: 4.2 mmol/L (ref 3.5–5.1)
Sodium: 136 mmol/L (ref 135–145)

## 2019-03-04 LAB — CBC WITH DIFFERENTIAL/PLATELET
Abs Immature Granulocytes: 0.55 10*3/uL — ABNORMAL HIGH (ref 0.00–0.07)
Basophils Absolute: 0.1 10*3/uL (ref 0.0–0.1)
Basophils Relative: 0 %
Eosinophils Absolute: 0 10*3/uL (ref 0.0–0.5)
Eosinophils Relative: 0 %
HCT: 37.4 % (ref 36.0–46.0)
Hemoglobin: 12.6 g/dL (ref 12.0–15.0)
Immature Granulocytes: 2 %
Lymphocytes Relative: 9 %
Lymphs Abs: 2.6 10*3/uL (ref 0.7–4.0)
MCH: 28.6 pg (ref 26.0–34.0)
MCHC: 33.7 g/dL (ref 30.0–36.0)
MCV: 85 fL (ref 80.0–100.0)
Monocytes Absolute: 0.5 10*3/uL (ref 0.1–1.0)
Monocytes Relative: 2 %
Neutro Abs: 25 10*3/uL — ABNORMAL HIGH (ref 1.7–7.7)
Neutrophils Relative %: 87 %
Platelets: <5 10*3/uL — CL (ref 150–400)
RBC: 4.4 MIL/uL (ref 3.87–5.11)
RDW: 14 % (ref 11.5–15.5)
WBC: 28.6 10*3/uL — ABNORMAL HIGH (ref 4.0–10.5)
nRBC: 0 % (ref 0.0–0.2)

## 2019-03-04 LAB — PATHOLOGIST SMEAR REVIEW

## 2019-03-04 NOTE — Progress Notes (Signed)
PROGRESS NOTE    [name]  FGH:[id number] DOB: [birth year] DOA: 03/02/2019 PCP: Gayla Doss., MD   Brief Narrative:  [name] is a 43 y.o. female with medical history significant of ITP, depression, Headache Presented with bleeding gums and petechiae all over her body and light headache and some dizziness.  No other complaint.  She was found to have platelets of only 5 in the ED.  CT head was done with no evidence of acute bleeding.  She was hospitalized under hospitalist service and oncology was consulted.  She was started on dexamethasone 40 mg p.o. 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  Assessment & Plan:   Active Problems:   Acute ITP (HCC)   Subarachnoidal hemorrhage (HCC)   Thrombocytopenia (HCC)  Acute flareup of chronic ITP: Presented with platelets of 5.  No more active bleeding.  It dropped to 3 and now back to 5.  Oncology saw patient.  Started on dexamethasone 40 mg p.o. daily for 5 days.  She was also started on Nplate.  Appreciate heme-onc recommendations and defer management to them.  Depression: Continue Celexa.  DVT prophylaxis: SCD/avoiding heparin Plavix due to ITP/thrombocytopenia Code Status: Inpatient Family Communication: Daughter present at bedside.  She functioned as Equities trader.  Discussed in length with daughter and answered questions.  Disposition Plan: To be determined  Estimated body mass index is 48.94 kg/m as calculated from the following:   Height as of this encounter: 4\' 11"  (1.499 m).   Weight as of this encounter: 109.9 kg.  Pressure Ulcer 09/19/15 Stage I -  Intact skin with non-blanchable redness of a localized area usually over a bony prominence. (Active)  09/19/15 1600  Location: Buttocks  Location Orientation: Left  Staging: Stage I -  Intact skin with non-blanchable redness of a localized area usually over a bony prominence.  Wound Description (Comments):   Present on Admission: No     Nutritional  status:               Consultants:   Heme/oncology   Procedures:   None  Antimicrobials:   None   Subjective: Seen and examined.  No complaints.  No more bleeding.  Objective: Vitals:   03/04/19 0000 03/04/19 0304 03/04/19 0802 03/04/19 1036  BP: (!) 96/45 (!) 102/56 (!) 100/46 (!) 108/58  Pulse: 82 70 67   Resp: 18 14 14 17   Temp: 98.3 F (36.8 C) 98.7 F (37.1 C) 98.4 F (36.9 C) 98.2 F (36.8 C)  TempSrc: Oral Oral Oral Oral  SpO2: 97% 99% 96%   Weight:      Height:       No intake or output data in the 24 hours ending 03/04/19 1219 Filed Weights   03/02/19 1614 03/03/19 0529  Weight: 112 kg 109.9 kg    Examination:  General exam: Appears calm and comfortable  Respiratory system: Clear to auscultation. Respiratory effort normal. Cardiovascular system: S1 & S2 heard, RRR. No JVD, murmurs, rubs, gallops or clicks. No pedal edema. Gastrointestinal system: Abdomen is nondistended, soft and nontender. No organomegaly or masses felt. Normal bowel sounds heard. Central nervous system: Alert and oriented. No focal neurological deficits. Extremities: Symmetric 5 x 5 power. Skin: No rashes, lesions or ulcers.  Psychiatry: Judgement and insight appear normal. Mood & affect appropriate.   Data Reviewed: I have personally reviewed following labs and imaging studies  CBC: Recent Labs  Lab 03/02/19 1705 03/03/19 0117 03/04/19 0216  WBC 9.1 14.3* 28.6*  NEUTROABS 4.6  --  25.0*  HGB 12.7 13.3 12.6  HCT 39.1 40.4 37.4  MCV 88.5 86.1 85.0  PLT <5* <5* <5*   Basic Metabolic Panel: Recent Labs  Lab 03/02/19 1705 03/03/19 0240 03/04/19 0216  NA 139 137 136  K 3.9 4.0 4.2  CL 106 104 107  CO2 24 21* 22  GLUCOSE 135* 197* 231*  BUN 11 12 15   CREATININE 0.63 0.69 0.68  CALCIUM 9.1 9.1 9.3  MG  --  2.1  --   PHOS  --  2.2*  --    GFR: Estimated Creatinine Clearance: 99 mL/min (by C-G formula based on SCr of 0.68 mg/dL). Liver Function  Tests: Recent Labs  Lab 03/02/19 1705 03/03/19 0240  AST 17 25  ALT 18 22  ALKPHOS 55 54  BILITOT 0.5 0.2*  PROT 6.7 7.1  ALBUMIN 3.6 3.9   No results for input(s): LIPASE, AMYLASE in the last 168 hours. No results for input(s): AMMONIA in the last 168 hours. Coagulation Profile: Recent Labs  Lab 03/02/19 1705  INR 1.0   Cardiac Enzymes: No results for input(s): CKTOTAL, CKMB, CKMBINDEX, TROPONINI in the last 168 hours. BNP (last 3 results) No results for input(s): PROBNP in the last 8760 hours. HbA1C: No results for input(s): HGBA1C in the last 72 hours. CBG: No results for input(s): GLUCAP in the last 168 hours. Lipid Profile: No results for input(s): CHOL, HDL, LDLCALC, TRIG, CHOLHDL, LDLDIRECT in the last 72 hours. Thyroid Function Tests: Recent Labs    03/03/19 0240  TSH 1.089   Anemia Panel: No results for input(s): VITAMINB12, FOLATE, FERRITIN, TIBC, IRON, RETICCTPCT in the last 72 hours. Sepsis Labs: No results for input(s): PROCALCITON, LATICACIDVEN in the last 168 hours.  Recent Results (from the past 240 hour(s))  SARS CORONAVIRUS 2 (TAT 6-24 HRS) Nasopharyngeal Nasopharyngeal Swab     Status: None   Collection Time: 03/02/19 10:20 PM   Specimen: Nasopharyngeal Swab  Result Value Ref Range Status   SARS Coronavirus 2 NEGATIVE NEGATIVE Final    Comment: (NOTE) SARS-CoV-2 target nucleic acids are NOT DETECTED. The SARS-CoV-2 RNA is generally detectable in upper and lower respiratory specimens during the acute phase of infection. Negative results do not preclude SARS-CoV-2 infection, do not rule out co-infections with other pathogens, and should not be used as the sole basis for treatment or other patient management decisions. Negative results must be combined with clinical observations, patient history, and epidemiological information. The expected result is Negative. Fact Sheet for Patients: SugarRoll.be Fact Sheet for  Healthcare Providers: https://www.woods-mathews.com/ This test is not yet approved or cleared by the [place] FDA and  has been authorized for detection and/or diagnosis of SARS-CoV-2 by FDA under an Emergency Use Authorization (EUA). This EUA will remain  in effect (meaning this test can be used) for the duration of the COVID-19 declaration under Section 56 4(b)(1) of the Act, 21 U.S.C. section 360bbb-3(b)(1), unless the authorization is terminated or revoked sooner. Performed at [facility], [place] [address], [place], [place] [address]       Radiology Studies: CT Head Wo Contrast  Result Date: 03/02/2019 CLINICAL DATA:  Headache, low platelets, petechiae EXAM: CT HEAD WITHOUT CONTRAST TECHNIQUE: Contiguous axial images were obtained from the base of the skull through the vertex without intravenous contrast. COMPARISON:  CT brain, 10/14/2018 FINDINGS: Brain: No evidence of acute infarction, hemorrhage, hydrocephalus, extra-axial collection or mass lesion/mass effect. There are multiple unchanged small foci of high attenuation in the cerebral hemispheres  near the gray-white junction (e.g. series 3, image 21, 19). Vascular: No hyperdense vessel or unexpected calcification. Skull: Normal. Negative for fracture or focal lesion. Sinuses/Orbits: No acute finding. Other: None. IMPRESSION: No acute intracranial pathology. No evidence of acute intracranial hemorrhage. There are multiple unchanged small foci of high attenuation in the cerebral hemispheres near the gray-white junction (e.g. series 3, image 21, 19), these small calcifications of uncertain etiology and nonacute, perhaps related to prior small junctional hemorrhages. Electronically Signed   By: Lauralyn PrimesAlex  Bibbey M.D.   On: 03/02/2019 19:57    Scheduled Meds: . citalopram  10 mg Oral Daily  . dexamethasone  40 mg Oral Daily  . influenza vac split quadrivalent PF  0.5 mL Intramuscular Tomorrow-1000  . sodium chloride flush   3 mL Intravenous Q12H   Continuous Infusions: . sodium chloride       LOS: 2 days   Time spent: 25 minutes   Hughie Clossavi Shirrell Solinger, MD [facility]  03/04/2019, 12:19 PM   To contact the attending provider between 7A-7P or the covering provider during after hours 7P-7A, please log into the web site [email or web address] and use password [email or web address].

## 2019-03-04 NOTE — Progress Notes (Signed)
HEMATOLOGY-ONCOLOGY PROGRESS NOTE  SUBJECTIVE: Patient was seen with the assistance of Spanish video interpreter.  Denies bleeding, headaches, dizziness.  Still has a rash to her bilateral legs and arms which is lighter today.  REVIEW OF SYSTEMS:   A 10 point ROS was negative except as noted in the HPI.   I have reviewed the past medical history, past surgical history, social history and family history with the patient and they are unchanged from previous note.   PHYSICAL EXAMINATION: ECOG PERFORMANCE STATUS: 0 - Asymptomatic  Vitals:   03/04/19 0802 03/04/19 1036  BP: (!) 100/46 (!) 108/58  Pulse: 67   Resp: 14 17  Temp: 98.4 F (36.9 C) 98.2 F (36.8 C)  SpO2: 96%    Filed Weights   03/02/19 1614 03/03/19 0529  Weight: 247 lb (112 kg) 242 lb 4.6 oz (109.9 kg)    Intake/Output from previous day: No intake/output data recorded.  GENERAL:alert, no distress and comfortable SKIN: Petechiae noted to bilateral legs and arms.  Petechiae appear to be more faint today.  Scattered ecchymoses noted to arms and legs.  OROPHARYNX: No oral lesions or bleeding LUNGS: clear to auscultation and percussion with normal breathing effort HEART: regular rate & rhythm and no murmurs and no lower extremity edema ABDOMEN:abdomen soft, non-tender and normal bowel sounds Musculoskeletal:no cyanosis of digits and no clubbing  NEURO: alert & oriented x 3 with fluent speech, no focal motor/sensory deficits  LABORATORY DATA:  I have reviewed the data as listed CMP Latest Ref Rng & Units 03/04/2019 03/03/2019 03/02/2019  Glucose 70 - 99 mg/dL 505(L) 976(B) 341(P)  BUN 6 - 20 mg/dL 15 12 11   Creatinine 0.44 - 1.00 mg/dL 3.79 0.24  Sodium 135 - 145 mmol/L 136 137 139  Potassium 3.5 - 5.1 mmol/L 4.2 4.0 3.9  Chloride 98 - 111 mmol/L 107 104 106  CO2 22 - 32 mmol/L 22 21(L) 24  Calcium 8.9 - 10.3 mg/dL 9.3 9.1 9.1  Total Protein 6.5 - 8.1 g/dL - 7.1 6.7  Total Bilirubin 0.3 - 1.2 mg/dL - 0.97)  0.5  Alkaline Phos 38 - 126 U/L - 54 55  AST 15 - 41 U/L - 25 17  ALT 0 - 44 U/L - 22 18    Lab Results  Component Value Date   WBC 28.6 (H) 03/04/2019   HGB 12.6 03/04/2019   HCT 37.4 03/04/2019   MCV 85.0 03/04/2019   PLT <5 (LL) 03/04/2019   NEUTROABS 25.0 (H) 03/04/2019    CT Head Wo Contrast  Result Date: 03/02/2019 CLINICAL DATA:  Headache, low platelets, petechiae EXAM: CT HEAD WITHOUT CONTRAST TECHNIQUE: Contiguous axial images were obtained from the base of the skull through the vertex without intravenous contrast. COMPARISON:  CT brain, 10/14/2018 FINDINGS: Brain: No evidence of acute infarction, hemorrhage, hydrocephalus, extra-axial collection or mass lesion/mass effect. There are multiple unchanged small foci of high attenuation in the cerebral hemispheres near the gray-white junction (e.g. series 3, image 21, 19). Vascular: No hyperdense vessel or unexpected calcification. Skull: Normal. Negative for fracture or focal lesion. Sinuses/Orbits: No acute finding. Other: None. IMPRESSION: No acute intracranial pathology. No evidence of acute intracranial hemorrhage. There are multiple unchanged small foci of high attenuation in the cerebral hemispheres near the gray-white junction (e.g. series 3, image 21, 19), these small calcifications of uncertain etiology and nonacute, perhaps related to prior small junctional hemorrhages. Electronically Signed   By: 10/16/2018 M.D.   On: 03/02/2019 19:57  ASSESSMENT AND PLAN: 1.  Recurrent ITP 2.  History of small foci of subarachnoid hemorrhage 3.  Leukocytosis due to dexamethasone.  -Platelet count less than 5000 today.  She has no active bleeding.  No need for platelet transfusion unless she develops active bleeding. Continue Dexamethasone 40 mg daily.  Status post N-plate 2 mcg/kg on 00/71/2197.  She has responded quickly to this medication in the past.  Anticipate a response within 2 to 3 days of administration.  If platelet count  does not start to improve tomorrow, may consider administration of IVIG or rituximab.  The patient has previously been on Promacta as an outpatient.  This is currently on hold.  We will plan to restart this upon discharge with a higher dose. -Has history of subarachnoid hemorrhage. Had headache on admission which has improved. CT of head did not show evidence of acute intracranial hemorrhage.     LOS: 2 days   Mikey Bussing, DNP, AGPCNP-BC, AOCNP 03/04/19

## 2019-03-05 DIAGNOSIS — D72829 Elevated white blood cell count, unspecified: Secondary | ICD-10-CM

## 2019-03-05 LAB — CBC WITH DIFFERENTIAL/PLATELET
Abs Immature Granulocytes: 1.03 10*3/uL — ABNORMAL HIGH (ref 0.00–0.07)
Basophils Absolute: 0.1 10*3/uL (ref 0.0–0.1)
Basophils Relative: 0 %
Eosinophils Absolute: 0 10*3/uL (ref 0.0–0.5)
Eosinophils Relative: 0 %
HCT: 38.2 % (ref 36.0–46.0)
Hemoglobin: 12.6 g/dL (ref 12.0–15.0)
Immature Granulocytes: 4 %
Lymphocytes Relative: 11 %
Lymphs Abs: 2.9 10*3/uL (ref 0.7–4.0)
MCH: 28.5 pg (ref 26.0–34.0)
MCHC: 33 g/dL (ref 30.0–36.0)
MCV: 86.4 fL (ref 80.0–100.0)
Monocytes Absolute: 0.9 10*3/uL (ref 0.1–1.0)
Monocytes Relative: 4 %
Neutro Abs: 20.5 10*3/uL — ABNORMAL HIGH (ref 1.7–7.7)
Neutrophils Relative %: 81 %
Platelets: 5 10*3/uL — CL (ref 150–400)
RBC: 4.42 MIL/uL (ref 3.87–5.11)
RDW: 14.4 % (ref 11.5–15.5)
WBC: 25.4 10*3/uL — ABNORMAL HIGH (ref 4.0–10.5)
nRBC: 0.1 % (ref 0.0–0.2)

## 2019-03-05 MED ORDER — IMMUNE GLOBULIN (HUMAN) 10 GM/100ML IV SOLN
1.0000 g/kg | INTRAVENOUS | Status: DC
  Administered 2019-03-05: 110 g via INTRAVENOUS
  Filled 2019-03-05: qty 1000
  Filled 2019-03-05: qty 1100

## 2019-03-05 NOTE — Progress Notes (Signed)
Patient ID: [name], female   DOB: [birth year], 43 y.o.   MRN: [id number]  PROGRESS NOTE    [name]  YTK:[id number] DOB: 1976-03-20 DOA: 03/02/2019 PCP: Charlynne Pander., MD   Brief Narrative:  43 year old female with history of ITP, depression, subarachnoid hemorrhage presented with bleeding gums and petechiae along with headache and dizziness.  She was found to have platelets of only 5 in the ED.  CT of the head was negative for acute abnormality.  She was admitted and oncology was consulted.  She was started on dexamethasone.  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 -Currently on Decadron as per oncology recommendations.  Status post N-plate on 55/73/2202 -Currently no signs of bleeding.  Leukocytosis -Probably reactive from steroids.  Depression -Continue Celexa  Morbid obesity -Outpatient follow-up  DVT prophylaxis: SCDs. Code Status: Full Family Communication: Spoke to patient at bedside Disposition Plan: Depends on clinical outcome  Consultants: Heme-onc  Procedures: None  Antimicrobials: None   Subjective: Patient seen and examined at bedside.  She denies any current bleeding.  No overnight fever or vomiting.  Objective: Vitals:   03/04/19 2320 03/05/19 0019 03/05/19 0423 03/05/19 0751  BP:  102/62 111/68 (!) 101/53  Pulse:  71 69   Resp: 19 (!) 22 13 16   Temp:  99 F (37.2 C) 98.4 F (36.9 C) 98.6 F (37 C)  TempSrc:  Oral Oral Oral  SpO2:  94% 95%   Weight:      Height:        Intake/Output Summary (Last 24 hours) at 03/05/2019 1028 Last data filed at 03/05/2019 1020 Gross per 24 hour  Intake 243 ml  Output --  Net 243 ml   Filed Weights   03/02/19 1614 03/03/19 0529  Weight: 112 kg 109.9 kg    Examination:  General exam: Appears calm and comfortable.  Poor historian Respiratory system: Bilateral decreased  breath sounds at bases Cardiovascular system: S1 & S2 heard, Rate controlled Gastrointestinal system: Abdomen is morbidly obese nondistended, soft and nontender. Normal bowel sounds heard. Extremities: No cyanosis, clubbing; trace lower extremity edema    Data Reviewed: I have personally reviewed following labs and imaging studies  CBC: Recent Labs  Lab 03/02/19 1705 03/03/19 0117 03/04/19 0216 03/05/19 0244  WBC 9.1 14.3* 28.6* 25.4*  NEUTROABS 4.6  --  25.0* 20.5*  HGB 12.7 13.3 12.6 12.6  HCT 39.1 40.4 37.4 38.2  MCV 88.5 86.1 85.0 86.4  PLT <5* <5* <5* 5*   Basic Metabolic Panel: Recent Labs  Lab 03/02/19 1705 03/03/19 0240 03/04/19 0216  NA 139 137 136  K 3.9 4.0 4.2  CL 106 104 107  CO2 24 21* 22  GLUCOSE 135* 197* 231*  BUN 11 12 15   CREATININE 0.63 0.69 0.68  CALCIUM 9.1 9.1 9.3  MG  --  2.1  --   PHOS  --  2.2*  --    GFR: Estimated Creatinine Clearance: 99 mL/min (by C-G formula based on SCr of 0.68 mg/dL). Liver Function Tests: Recent Labs  Lab 03/02/19 1705 03/03/19 0240  AST 17 25  ALT 18 22  ALKPHOS 55 54  BILITOT 0.5 0.2*  PROT 6.7 7.1  ALBUMIN 3.6 3.9   No results for input(s): LIPASE, AMYLASE in the last 168 hours. No results for input(s): AMMONIA in the last 168 hours. Coagulation Profile: Recent Labs  Lab 03/02/19 1705  INR  1.0   Cardiac Enzymes: No results for input(s): CKTOTAL, CKMB, CKMBINDEX, TROPONINI in the last 168 hours. BNP (last 3 results) No results for input(s): PROBNP in the last 8760 hours. HbA1C: No results for input(s): HGBA1C in the last 72 hours. CBG: No results for input(s): GLUCAP in the last 168 hours. Lipid Profile: No results for input(s): CHOL, HDL, LDLCALC, TRIG, CHOLHDL, LDLDIRECT in the last 72 hours. Thyroid Function Tests: Recent Labs    03/03/19 0240  TSH 1.089   Anemia Panel: No results for input(s): VITAMINB12, FOLATE, FERRITIN, TIBC, IRON, RETICCTPCT in the last 72 hours. Sepsis  Labs: No results for input(s): PROCALCITON, LATICACIDVEN in the last 168 hours.  Recent Results (from the past 240 hour(s))  SARS CORONAVIRUS 2 (TAT 6-24 HRS) Nasopharyngeal Nasopharyngeal Swab     Status: None   Collection Time: 03/02/19 10:20 PM   Specimen: Nasopharyngeal Swab  Result Value Ref Range Status   SARS Coronavirus 2 NEGATIVE NEGATIVE Final    Comment: (NOTE) SARS-CoV-2 target nucleic acids are NOT DETECTED. The SARS-CoV-2 RNA is generally detectable in upper and lower respiratory specimens during the acute phase of infection. Negative results do not preclude SARS-CoV-2 infection, do not rule out co-infections with other pathogens, and should not be used as the sole basis for treatment or other patient management decisions. Negative results must be combined with clinical observations, patient history, and epidemiological information. The expected result is Negative. Fact Sheet for Patients: HairSlick.no Fact Sheet for Healthcare Providers: quierodirigir.com This test is not yet approved or cleared by the [place] FDA and  has been authorized for detection and/or diagnosis of SARS-CoV-2 by FDA under an Emergency Use Authorization (EUA). This EUA will remain  in effect (meaning this test can be used) for the duration of the COVID-19 declaration under Section 56 4(b)(1) of the Act, 21 U.S.C. section 360bbb-3(b)(1), unless the authorization is terminated or revoked sooner. Performed at [facility], [address], [place], [place] [address]          Radiology Studies: No results found.      Scheduled Meds: . citalopram  10 mg Oral Daily  . dexamethasone  40 mg Oral Daily  . influenza vac split quadrivalent PF  0.5 mL Intramuscular Tomorrow-1000  . sodium chloride flush  3 mL Intravenous Q12H   Continuous Infusions: . sodium chloride            Glade Lloyd, MD [facility] 03/05/2019, 10:28 AM

## 2019-03-05 NOTE — Progress Notes (Addendum)
HEMATOLOGY-ONCOLOGY PROGRESS NOTE  SUBJECTIVE: Patient was seen with the assistance of Spanish video interpreter.  Denies bleeding, headaches, dizziness.  Still has a rash to her bilateral legs and arms which is lighter today.  REVIEW OF SYSTEMS:   A 10 point ROS was negative except as noted in the HPI.   I have reviewed the past medical history, past surgical history, social history and family history with the patient and they are unchanged from previous note.   PHYSICAL EXAMINATION: ECOG PERFORMANCE STATUS: 0 - Asymptomatic  Vitals:   03/05/19 1030 03/05/19 1330  BP: 99/61 100/62  Pulse:  64  Resp: 17 19  Temp: 98.4 F (36.9 C) 98.7 F (37.1 C)  SpO2:  95%   Filed Weights   03/02/19 1614 03/03/19 0529  Weight: 247 lb (112 kg) 242 lb 4.6 oz (109.9 kg)    Intake/Output from previous day: 12/15 0701 - 12/16 0700 In: 240 [P.O.:240] Out: -   GENERAL:alert, no distress and comfortable SKIN: Petechiae noted to bilateral legs and arms.  Petechiae appear to be more faint today.  Scattered ecchymoses noted to arms and legs.  OROPHARYNX: No oral lesions or bleeding LUNGS: clear to auscultation and percussion with normal breathing effort HEART: regular rate & rhythm and no murmurs and no lower extremity edema ABDOMEN:abdomen soft, non-tender and normal bowel sounds Musculoskeletal:no cyanosis of digits and no clubbing  NEURO: alert & oriented x 3 with fluent speech, no focal motor/sensory deficits  LABORATORY DATA:  I have reviewed the data as listed CMP Latest Ref Rng & Units 03/04/2019 03/03/2019 03/02/2019  Glucose 70 - 99 mg/dL 720(N) 470(J) 628(Z)  BUN 6 - 20 mg/dL 15 12 11   Creatinine 0.44 - 1.00 mg/dL 6.62 9.47  Sodium 135 - 145 mmol/L 136 137 139  Potassium 3.5 - 5.1 mmol/L 4.2 4.0 3.9  Chloride 98 - 111 mmol/L 107 104 106  CO2 22 - 32 mmol/L 22 21(L) 24  Calcium 8.9 - 10.3 mg/dL 9.3 9.1 9.1  Total Protein 6.5 - 8.1 g/dL - 7.1 6.7  Total Bilirubin 0.3 - 1.2  mg/dL - 6.54) 0.5  Alkaline Phos 38 - 126 U/L - 54 55  AST 15 - 41 U/L - 25 17  ALT 0 - 44 U/L - 22 18    Lab Results  Component Value Date   WBC 25.4 (H) 03/05/2019   HGB 12.6 03/05/2019   HCT 38.2 03/05/2019   MCV 86.4 03/05/2019   PLT 5 (LL) 03/05/2019   NEUTROABS 20.5 (H) 03/05/2019    CT Head Wo Contrast  Result Date: 03/02/2019 CLINICAL DATA:  Headache, low platelets, petechiae EXAM: CT HEAD WITHOUT CONTRAST TECHNIQUE: Contiguous axial images were obtained from the base of the skull through the vertex without intravenous contrast. COMPARISON:  CT brain, 10/14/2018 FINDINGS: Brain: No evidence of acute infarction, hemorrhage, hydrocephalus, extra-axial collection or mass lesion/mass effect. There are multiple unchanged small foci of high attenuation in the cerebral hemispheres near the gray-white junction (e.g. series 3, image 21, 19). Vascular: No hyperdense vessel or unexpected calcification. Skull: Normal. Negative for fracture or focal lesion. Sinuses/Orbits: No acute finding. Other: None. IMPRESSION: No acute intracranial pathology. No evidence of acute intracranial hemorrhage. There are multiple unchanged small foci of high attenuation in the cerebral hemispheres near the gray-white junction (e.g. series 3, image 21, 19), these small calcifications of uncertain etiology and nonacute, perhaps related to prior small junctional hemorrhages. Electronically Signed   By: 10/16/2018.D.  On: 03/02/2019 19:57    ASSESSMENT AND PLAN: 1.  Recurrent ITP 2.  History of small foci of subarachnoid hemorrhage 3.  Leukocytosis due to dexamethasone.  -Platelet count 5000 today.  She has no active bleeding.  No need for platelet transfusion unless she develops active bleeding. Continue Dexamethasone 40 mg daily for total 4-day days (today is last day).  Status post N-plate 2 mcg/kg on 88/32/5498.  Her platelets have been slow to respond.  Recommend proceeding with IVIG 1 g/kg today.  She has  received this in the past.  Adverse effects of been reviewed with her and she agrees to proceed.  If platelet count is greater than 10,000 tomorrow, will not give an additional dose of IVIG.  When her platelet count is above 20,000, she will be discharged home with outpatient follow-up.   -Has history of subarachnoid hemorrhage. Had headache on admission which has improved. CT of head did not show evidence of acute intracranial hemorrhage.  -Upon discharge, the patient will need to go home on Promacta 50 mg daily.  The patient already has a full bottle of Promacta 25 mg tablets.  She was advised to take 2 tablets/day to equal 50 mg after discharge.  We will arrange for additional refills through our office.   LOS: 3 days   Mikey Bussing, DNP, AGPCNP-BC, AOCNP 03/05/19  Addendum  I have seen the patient. I agree with the assessment and and plan and have edited the notes.   Due to her persistent severe thrombocytopenia, I gave her a dose of IVIG 1mg /kg today, no plt transfusion needed for now. I anticipate her plt will recover in the next few days, OK to discharge when plt increase significantly.  Truitt Merle  03/05/2019

## 2019-03-06 LAB — CBC WITH DIFFERENTIAL/PLATELET
Abs Immature Granulocytes: 0.83 10*3/uL — ABNORMAL HIGH (ref 0.00–0.07)
Basophils Absolute: 0.1 10*3/uL (ref 0.0–0.1)
Basophils Relative: 0 %
Eosinophils Absolute: 0 10*3/uL (ref 0.0–0.5)
Eosinophils Relative: 0 %
HCT: 36 % (ref 36.0–46.0)
Hemoglobin: 12 g/dL (ref 12.0–15.0)
Immature Granulocytes: 4 %
Lymphocytes Relative: 8 %
Lymphs Abs: 1.9 10*3/uL (ref 0.7–4.0)
MCH: 28.6 pg (ref 26.0–34.0)
MCHC: 33.3 g/dL (ref 30.0–36.0)
MCV: 85.9 fL (ref 80.0–100.0)
Monocytes Absolute: 1.2 10*3/uL — ABNORMAL HIGH (ref 0.1–1.0)
Monocytes Relative: 5 %
Neutro Abs: 18.9 10*3/uL — ABNORMAL HIGH (ref 1.7–7.7)
Neutrophils Relative %: 83 %
Platelets: 37 10*3/uL — ABNORMAL LOW (ref 150–400)
RBC: 4.19 MIL/uL (ref 3.87–5.11)
RDW: 14.5 % (ref 11.5–15.5)
WBC: 22.9 10*3/uL — ABNORMAL HIGH (ref 4.0–10.5)
nRBC: 0.3 % — ABNORMAL HIGH (ref 0.0–0.2)

## 2019-03-06 MED ORDER — ELTROMBOPAG OLAMINE 25 MG PO TABS: 50.0000 mg | ORAL_TABLET | Freq: Every day | ORAL | Status: DC

## 2019-03-06 NOTE — Progress Notes (Signed)
Discharge instructions gone over with patient and patient's daughter.  Question's answered.  IV discontinued.  Patient ready to be discharged.   [name] [facility] 03/06/2019 10:51 AM

## 2019-03-06 NOTE — Discharge Summary (Signed)
Physician Discharge Summary  [name] UJW:[id number] DOB: 12/30/1975 DOA: 03/02/2019  PCP: [name], [name], MD  Admit date: 03/02/2019 Discharge date: 03/06/2019  Admitted From: Home Disposition: Home  Recommendations for Outpatient Follow-up:  1. Follow up with PCP in 1 week with repeat CBC/BMP 2. Outpatient follow-up with oncology/Dr. Mosetta PuttFeng 3. Follow up in ED if symptoms worsen or new appear   Home Health: No Equipment/Devices: None  Discharge Condition: Stable CODE STATUS: Full Diet recommendation: Regular  Brief/Interim Summary: 43 year old female with history of ITP, depression, subarachnoid hemorrhage presented with bleeding gums and petechiae along with headache and dizziness.  She was found to have platelets of only 5 in the ED.  CT of the head was negative for acute abnormality.  She was admitted and oncology was consulted.  She was started on dexamethasone.  During the hospitalization, she was treated with Nplate on 03/03/2019 and also received IVIG on 03/05/2019.  Platelets are 37 today.  Oncology has cleared the patient for discharge with outpatient follow-up with oncology within a week.  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  Treated with 4 days of oral Decadron.  Patient also received IVIG on 03/05/2019. -Currently no signs of bleeding. - Platelets are 37 today.  Oncology has cleared the patient for discharge with outpatient follow-up with oncology within a week.   Leukocytosis -Probably reactive from steroids.  Depression -Continue Celexa  Morbid obesity -Outpatient follow-up  Discharge Instructions  Discharge Instructions    Ambulatory referral to Oncology   Complete by: As directed    Diet general   Complete by: As directed    Increase activity slowly   Complete by: As directed      Allergies as of 03/06/2019   No  Known Allergies     Medication List    TAKE these medications   acetaminophen 500 MG tablet Commonly known as: TYLENOL Take 1-2 tablets (500-1,000 mg total) by mouth every 6 (six) hours as needed for mild pain or moderate pain. What changed:   how much to take  when to take this   citalopram 10 MG tablet Commonly known as: CELEXA Take 10 mg by mouth daily.   eltrombopag 25 MG tablet Commonly known as: PROMACTA Take 2 tablets (50 mg total) by mouth daily. Take on an empty stomach, 1 hour before a meal or 2 hours after. What changed: how much to take   multivitamin capsule Take 1 capsule by mouth daily.      Follow-up Information    [name], [name], MD. Schedule an appointment as soon as possible for a visit in 1 week(s).   Specialty: General Practice Why: with repeat cbc Contact information: [address] [place] [address] [phone]        [name], [name], MD. Schedule an appointment as soon as possible for a visit in 1 week(s).   Specialties: Hematology, Oncology Contact information: [address] [place] [address] [phone]          No Known Allergies  Consultations:  Heme-onc   Procedures/Studies: CT Head Wo Contrast  Result Date: 03/02/2019 CLINICAL DATA:  Headache, low platelets, petechiae EXAM: CT HEAD WITHOUT CONTRAST TECHNIQUE: Contiguous axial images were obtained from the base of the skull through the vertex without intravenous contrast. COMPARISON:  CT brain, 10/14/2018 FINDINGS: Brain: No evidence of acute infarction, hemorrhage, hydrocephalus, extra-axial collection or mass lesion/mass effect. There are multiple  unchanged small foci of high attenuation in the cerebral hemispheres near the gray-white junction (e.g. series 3, image 21, 19). Vascular: No hyperdense vessel or unexpected calcification. Skull: Normal. Negative for fracture or focal lesion. Sinuses/Orbits: No acute finding. Other: None. IMPRESSION: No acute  intracranial pathology. No evidence of acute intracranial hemorrhage. There are multiple unchanged small foci of high attenuation in the cerebral hemispheres near the gray-white junction (e.g. series 3, image 21, 19), these small calcifications of uncertain etiology and nonacute, perhaps related to prior small junctional hemorrhages. Electronically Signed   By: Lauralyn Primes M.D.   On: 03/02/2019 19:57       Subjective: Patient seen and examined at bedside.  Denies any bleeding.  No overnight fever, vomiting or headache.  Discharge Exam: Vitals:   03/06/19 0424 03/06/19 0744  BP: (!) 102/59 (!) 94/53  Pulse: (!) 54 60  Resp: 17 17  Temp: 98.2 F (36.8 C) 98.7 F (37.1 C)  SpO2:  93%    General: Pt is alert, awake, not in acute distress Cardiovascular: rate controlled, S1/S2 + Respiratory: bilateral decreased breath sounds at bases Abdominal: Soft,, morbidly obese, NT, ND, bowel sounds + Extremities: Trace lower extremity edema, no cyanosis    The results of significant diagnostics from this hospitalization (including imaging, microbiology, ancillary and laboratory) are listed below for reference.     Microbiology: Recent Results (from the past 240 hour(s))  SARS CORONAVIRUS 2 (TAT 6-24 HRS) Nasopharyngeal Nasopharyngeal Swab     Status: None   Collection Time: 03/02/19 10:20 PM   Specimen: Nasopharyngeal Swab  Result Value Ref Range Status   SARS Coronavirus 2 NEGATIVE NEGATIVE Final    Comment: (NOTE) SARS-CoV-2 target nucleic acids are NOT DETECTED. The SARS-CoV-2 RNA is generally detectable in upper and lower respiratory specimens during the acute phase of infection. Negative results do not preclude SARS-CoV-2 infection, do not rule out co-infections with other pathogens, and should not be used as the sole basis for treatment or other patient management decisions. Negative results must be combined with clinical observations, patient history, and epidemiological  information. The expected result is Negative. Fact Sheet for Patients: HairSlick.no Fact Sheet for Healthcare Providers: quierodirigir.com This test is not yet approved or cleared by the [place] FDA and  has been authorized for detection and/or diagnosis of SARS-CoV-2 by FDA under an Emergency Use Authorization (EUA). This EUA will remain  in effect (meaning this test can be used) for the duration of the COVID-19 declaration under Section 56 4(b)(1) of the Act, 21 U.S.C. section 360bbb-3(b)(1), unless the authorization is terminated or revoked sooner. Performed at [facility], [address] [place], [place] [address]      Labs: BNP (last 3 results) No results for input(s): BNP in the last 8760 hours. Basic Metabolic Panel: Recent Labs  Lab 03/02/19 1705 03/03/19 0240 03/04/19 0216  NA 139 137 136  K 3.9 4.0 4.2  CL 106 104 107  CO2 24 21* 22  GLUCOSE 135* 197* 231*  BUN 11 12 15   CREATININE 0.63 0.69 0.68  CALCIUM 9.1 9.1 9.3  MG  --  2.1  --   PHOS  --  2.2*  --    Liver Function Tests: Recent Labs  Lab 03/02/19 1705 03/03/19 0240  AST 17 25  ALT 18 22  ALKPHOS 55 54  BILITOT 0.5 0.2*  PROT 6.7 7.1  ALBUMIN 3.6 3.9   No results for input(s): LIPASE, AMYLASE in the last 168 hours.  No results for input(s): AMMONIA in the last 168 hours. CBC: Recent Labs  Lab 03/02/19 1705 03/03/19 0117 03/04/19 0216 03/05/19 0244 03/06/19 0249  WBC 9.1 14.3* 28.6* 25.4* 22.9*  NEUTROABS 4.6  --  25.0* 20.5* 18.9*  HGB 12.7 13.3 12.6 12.6 12.0  HCT 39.1 40.4 37.4 38.2 36.0  MCV 88.5 86.1 85.0 86.4 85.9  PLT <5* <5* <5* 5* 37*   Cardiac Enzymes: No results for input(s): CKTOTAL, CKMB, CKMBINDEX, TROPONINI in the last 168 hours. BNP: Invalid input(s): POCBNP CBG: No results for input(s): GLUCAP in the last 168 hours. D-Dimer No results for input(s): DDIMER in the last 72 hours. Hgb A1c No  results for input(s): HGBA1C in the last 72 hours. Lipid Profile No results for input(s): CHOL, HDL, LDLCALC, TRIG, CHOLHDL, LDLDIRECT in the last 72 hours. Thyroid function studies No results for input(s): TSH, T4TOTAL, T3FREE, THYROIDAB in the last 72 hours.  Invalid input(s): FREET3 Anemia work up No results for input(s): VITAMINB12, FOLATE, FERRITIN, TIBC, IRON, RETICCTPCT in the last 72 hours. Urinalysis    Component Value Date/Time   COLORURINE YELLOW 11/16/2018 1053   APPEARANCEUR CLEAR 11/16/2018 1053   LABSPEC 1.020 11/16/2018 1053   PHURINE 6.0 11/16/2018 1053   GLUCOSEU NEGATIVE 11/16/2018 [place] 11/16/2018 [place] 11/16/2018 1053   BILIRUBINUR neg 10/12/2015 0852   KETONESUR NEGATIVE 11/16/2018 1053   PROTEINUR NEGATIVE 11/16/2018 1053   UROBILINOGEN 0.2 10/12/2015 0852   UROBILINOGEN 0.2 10/02/2011 1133   NITRITE NEGATIVE 11/16/2018 1053   LEUKOCYTESUR SMALL (A) 11/16/2018 1053   Sepsis Labs Invalid input(s): PROCALCITONIN,  WBC,  LACTICIDVEN Microbiology Recent Results (from the past 240 hour(s))  SARS CORONAVIRUS 2 (TAT 6-24 HRS) Nasopharyngeal Nasopharyngeal Swab     Status: None   Collection Time: 03/02/19 10:20 PM   Specimen: Nasopharyngeal Swab  Result Value Ref Range Status   SARS Coronavirus 2 NEGATIVE NEGATIVE Final    Comment: (NOTE) SARS-CoV-2 target nucleic acids are NOT DETECTED. The SARS-CoV-2 RNA is generally detectable in upper and lower respiratory specimens during the acute phase of infection. Negative results do not preclude SARS-CoV-2 infection, do not rule out co-infections with other pathogens, and should not be used as the sole basis for treatment or other patient management decisions. Negative results must be combined with clinical observations, patient history, and epidemiological information. The expected result is Negative. Fact Sheet for Patients: SugarRoll.be Fact  Sheet for Healthcare Providers: https://www.woods-mathews.com/ This test is not yet approved or cleared by the [place] FDA and  has been authorized for detection and/or diagnosis of SARS-CoV-2 by FDA under an Emergency Use Authorization (EUA). This EUA will remain  in effect (meaning this test can be used) for the duration of the COVID-19 declaration under Section 56 4(b)(1) of the Act, 21 U.S.C. section 360bbb-3(b)(1), unless the authorization is terminated or revoked sooner. Performed at [facility], [place] [address], [place], [place] [address]      Time coordinating discharge: 35 minutes  SIGNED:   Aline August, MD  [facility] 03/06/2019, 9:39 AM

## 2019-03-10 ENCOUNTER — Telehealth: Payer: Self-pay | Admitting: Hematology

## 2019-03-10 NOTE — Telephone Encounter (Signed)
Returned patient's phone call regarding rescheduling an appointment, per patient's request 01/08 appointment has moved to 12/30.

## 2019-03-19 ENCOUNTER — Encounter: Payer: Self-pay | Admitting: Nurse Practitioner

## 2019-03-19 ENCOUNTER — Inpatient Hospital Stay (HOSPITAL_BASED_OUTPATIENT_CLINIC_OR_DEPARTMENT_OTHER): Payer: Self-pay | Admitting: Nurse Practitioner

## 2019-03-19 ENCOUNTER — Inpatient Hospital Stay: Payer: Self-pay | Attending: Nurse Practitioner

## 2019-03-19 ENCOUNTER — Telehealth: Payer: Self-pay | Admitting: Nurse Practitioner

## 2019-03-19 ENCOUNTER — Other Ambulatory Visit: Payer: Self-pay

## 2019-03-19 VITALS — BP 106/51 | HR 83 | Temp 98.3°F | Resp 17 | Ht 59.0 in | Wt 248.5 lb

## 2019-03-19 DIAGNOSIS — Z79899 Other long term (current) drug therapy: Secondary | ICD-10-CM | POA: Insufficient documentation

## 2019-03-19 DIAGNOSIS — D693 Immune thrombocytopenic purpura: Secondary | ICD-10-CM

## 2019-03-19 DIAGNOSIS — Z9225 Personal history of immunosupression therapy: Secondary | ICD-10-CM | POA: Insufficient documentation

## 2019-03-19 DIAGNOSIS — F329 Major depressive disorder, single episode, unspecified: Secondary | ICD-10-CM | POA: Insufficient documentation

## 2019-03-19 DIAGNOSIS — Z9221 Personal history of antineoplastic chemotherapy: Secondary | ICD-10-CM | POA: Insufficient documentation

## 2019-03-19 DIAGNOSIS — D649 Anemia, unspecified: Secondary | ICD-10-CM | POA: Insufficient documentation

## 2019-03-19 DIAGNOSIS — Z9081 Acquired absence of spleen: Secondary | ICD-10-CM | POA: Insufficient documentation

## 2019-03-19 LAB — CBC WITH DIFFERENTIAL (CANCER CENTER ONLY)
Abs Immature Granulocytes: 0.07 10*3/uL (ref 0.00–0.07)
Basophils Absolute: 0 10*3/uL (ref 0.0–0.1)
Basophils Relative: 0 %
Eosinophils Absolute: 0.2 10*3/uL (ref 0.0–0.5)
Eosinophils Relative: 2 %
HCT: 34.7 % — ABNORMAL LOW (ref 36.0–46.0)
Hemoglobin: 11.6 g/dL — ABNORMAL LOW (ref 12.0–15.0)
Immature Granulocytes: 1 %
Lymphocytes Relative: 30 %
Lymphs Abs: 2.9 10*3/uL (ref 0.7–4.0)
MCH: 29.3 pg (ref 26.0–34.0)
MCHC: 33.4 g/dL (ref 30.0–36.0)
MCV: 87.6 fL (ref 80.0–100.0)
Monocytes Absolute: 1 10*3/uL (ref 0.1–1.0)
Monocytes Relative: 11 %
Neutro Abs: 5.4 10*3/uL (ref 1.7–7.7)
Neutrophils Relative %: 56 %
Platelet Count: 266 10*3/uL (ref 150–400)
RBC: 3.96 MIL/uL (ref 3.87–5.11)
RDW: 14.9 % (ref 11.5–15.5)
WBC Count: 9.6 10*3/uL (ref 4.0–10.5)
nRBC: 0.4 % — ABNORMAL HIGH (ref 0.0–0.2)

## 2019-03-19 MED ORDER — ELTROMBOPAG OLAMINE 25 MG PO TABS: 50.0000 mg | ORAL_TABLET | Freq: Every day | ORAL | 3 refills | Status: DC

## 2019-03-19 NOTE — Progress Notes (Signed)
[facility]   [phone] [phone]   Clinic Follow up Note   Patient Care Team: Gayla Doss., MD as PCP - General (General Practice) Levert Feinstein, MD as Consulting Physician (Oncology) [name] (Hematology and Oncology) Elwin Mocha, MD as Consulting Physician (Ophthalmology) Justice Deeds, MD (Internal Medicine) 03/19/2019  CHIEF COMPLAINT: ITP, hospital f/u   CURRENT THERAPY: Promacta 50 mg daily  INTERVAL HISTORY: [name] returns for hospital f/u as scheduled. She presented to ED on 03/02/19 with bleeding gums and lower extremity petechiae along with headache and dizziness. PLT 5K in the ED. She was given 4 day course of dex 40 mg daily and NPlate 2 mcg/kg on 03/03/19. PLT remained 5K and she was given IVIG 1 g/kg on 03/05/19. PLT improved and she was discharged on 12/17 with PLT count 37K on promacta 50 mg.   Today she presents with Spanish interpreter. She feels better since hospital discharge, no more dizziness or headache but still fatigued. She can do ADLs. Appetite is normal. She developed blood mixed with stool with each BM, usually twice per day, for the past 4 days. Denies n/v/c/d, abdominal pain. She doesn't think she had this before. Has never had colonoscopy. Iron was borderline low in 01/2019 and she took iron tab for 2 weeks but stopped with this admission bc she thought it caused the ITP flare. LMP ended 12/27. Denies epistaxis, hematuria, hematemesis. Denies further oral bleeding or bruising.    MEDICAL HISTORY:  Past Medical History:  Diagnosis Date  . Anemia   . Chronic ITP (idiopathic thrombocytopenia) (HCC)   . Depression   . Headache   . 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 BREAST SURGERY  biopsy  . SPLENECTOMY, TOTAL N/A 09/16/2015   Procedure: OPEN SPLENECTOMY;   Surgeon: Chevis Pretty III, MD;  Location: [facility] OR;  Service: General;  Laterality: N/A;    I have reviewed the social history and family history with the patient and they are unchanged from previous note.  ALLERGIES:  has No Known Allergies.  MEDICATIONS:  Current Outpatient Medications  Medication Sig Dispense Refill  . acetaminophen (TYLENOL) 500 MG tablet Take 1-2 tablets (500-1,000 mg total) by mouth every 6 (six) hours as needed for mild pain or moderate pain. (Patient taking differently: Take 500 mg by mouth daily as needed for mild pain or moderate pain. ) 30 tablet 0  . eltrombopag (PROMACTA) 25 MG tablet Take 2 tablets (50 mg total) by mouth daily. Take on an empty stomach, 1 hour before a meal or 2 hours after. 60 tablet 3  . Multiple Vitamin (MULTIVITAMIN) capsule Take 1 capsule by mouth daily.    . citalopram (CELEXA) 10 MG tablet Take 10 mg by mouth daily.     No current facility-administered medications for this visit.    PHYSICAL EXAMINATION: ECOG PERFORMANCE STATUS: 1 - Symptomatic but completely ambulatory  Vitals:   03/19/19 0808  BP: (!) 106/51  Pulse: 83  Resp: 17  Temp: 98.3 F (36.8 C)  SpO2: 100%   Filed Weights   03/19/19 0808  Weight: 248 lb 8 oz (112.7 kg)    GENERAL:alert, no distress and comfortable SKIN: no rash or petechiae  EYES: sclera clear OROPHARYNX: no gingival bleeding LUNGS: clear with normal breathing effort HEART: regular rate & rhythm, no lower extremity edema ABDOMEN: abdomen  soft, non-tender and normal bowel sounds RECTAL exam reveals no obvious mass that I could palpate. There is stool in the rectum. No blood on glove NEURO: alert & oriented x 3 with fluent speech, no focal motor/sensory deficits  LABORATORY DATA:  I have reviewed the data as listed CBC Latest Ref Rng & Units 03/19/2019 03/06/2019 03/05/2019  WBC 4.0 - 10.5 K/uL 9.6 22.9(H) 25.4(H)  Hemoglobin 12.0 - 15.0 g/dL 11.6(L) 12.0 12.6  Hematocrit 36.0 - 46.0 % 34.7(L)  36.0 38.2  Platelets 150 - 400 K/uL 266 37(L) 5(LL)     RADIOGRAPHIC STUDIES: I have personally reviewed the radiological images as listed and agreed with the findings in the report. No results found.   ASSESSMENT & PLAN: [name] a 43 y.o.femalewith   1. 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 -She was hospitalizedagainon 10/14/18 for acute ITP flare. She was treated with dexamethasone, andone doseNPlate.Platelets improved from 17K to 36K.  -Due to her recurrent ITP flare she was started on maintenance TPO oralPromacta in 10/2018. -She did well until she developed gum bleeding, petechiae, headache and dizziness on 03/02/19, PLT 5K. She was given Dex 40 mg x4 days, Nplate 52mcg/kg x1, and IVIG 1g/kg x1. PLT responded to 37K at discharge. Promacta was increased to 50 mg daily which she tolerates well -[name] appears stable today. She is recovering from recent hospitalization. PLT normalized to 266K today. She does report active bleeding with BRBPR x4 days. Rectal exam is unremarkable. She noted this to me in 11/2018 and declined GI referral at the time but agrees today.  -From ITP standpoint, she is doing well. Will monitor CBC monthly x3, will adjust Promacta as needed.  -F/u in 3 months  2. Mild intermittent anemia -She has mild intermittent anemia, dating back to 2017; Hgb 11.6 today, normal MCV -iron studies were borderline on 02/10/19: ferritin 12, serum iron 46, 12% transferrin saturation, TIBC 380. She started oral iron but only took it for 2 weeks, she stopped it during 02/2019 admission bc she thought it caused ITP flare -She  reports 4 days of BRBPR with BM, not related to constipation. Rectal exam is unremarkable today. I recommend to complete a stool card at home to check for blood  -I recommend to restart oral iron once daily.  -She reported this to me in 11/2018 but declined GI referral at the time. She agrees today and I referred her.   3.History of small foci ofsubarachnoid hemorrhage in 02/2018 -Secondary to #1 -recovered well overall -she presented with HA on 03/02/19, CT was negative for acute intracranial pathology -headaches resolved at discharge     PLAN: -labs reviewed -continue 50 mg Promacta once daily, refilled -labs monthly x3 -f/u in 3 months -stool card given today -resume once daily oral iron  -referral to GI for rectal bleeding  No problem-specific Assessment & Plan notes found for this encounter.   Orders Placed This Encounter  Procedures  . Ambulatory referral to Gastroenterology    Referral Priority:   Routine    Referral Type:   Consultation    Referral Reason:   Specialty Services Required    Number of Visits Requested:   1   All questions were answered. The patient knows to call the clinic  with any problems, questions or concerns. No barriers to learning was detected. I spent 20 minutes counseling the patient face to face. The total time spent in the appointment was 25 minutes and more than 50% was on counseling and review of test results     Pollyann SamplesLacie K Deavion Dobbs, NP 03/19/19

## 2019-03-19 NOTE — Telephone Encounter (Signed)
Scheduled per 12/30 los, patient received calender.  

## 2019-03-25 ENCOUNTER — Telehealth: Payer: Self-pay

## 2019-03-25 NOTE — Telephone Encounter (Signed)
I spoke with Ms [name] daughter [name],  I told her [name]'s plt level was normal and she was to continue Promacta 50 mg daily per Dr. Mosetta Putt instructions. She verbalized understanding.

## 2019-03-26 ENCOUNTER — Encounter: Payer: Self-pay | Admitting: Gastroenterology

## 2019-03-28 ENCOUNTER — Other Ambulatory Visit: Payer: Self-pay

## 2019-03-28 ENCOUNTER — Ambulatory Visit: Payer: Self-pay | Admitting: Hematology

## 2019-04-07 ENCOUNTER — Telehealth: Payer: Self-pay

## 2019-04-07 ENCOUNTER — Other Ambulatory Visit: Payer: Self-pay | Admitting: Pharmacist

## 2019-04-07 DIAGNOSIS — D693 Immune thrombocytopenic purpura: Secondary | ICD-10-CM

## 2019-04-07 MED ORDER — ELTROMBOPAG OLAMINE 50 MG PO TABS: 50.0000 mg | ORAL_TABLET | Freq: Every day | ORAL | 3 refills | Status: DC

## 2019-04-07 NOTE — Telephone Encounter (Signed)
Someone called on patient's behalf, did not leave their name, stating she had developed bleeding gums again.   [phone]

## 2019-04-08 ENCOUNTER — Inpatient Hospital Stay (HOSPITAL_COMMUNITY)
Admission: EM | Admit: 2019-04-08 | Discharge: 2019-04-10 | DRG: 813 | Disposition: A | Discharge status: Home / Self Care | Payer: Self-pay | Attending: Hematology | Admitting: Hematology

## 2019-04-08 ENCOUNTER — Encounter (HOSPITAL_COMMUNITY): Payer: Self-pay | Admitting: Emergency Medicine

## 2019-04-08 ENCOUNTER — Other Ambulatory Visit: Payer: Self-pay

## 2019-04-08 DIAGNOSIS — Z9081 Acquired absence of spleen: Secondary | ICD-10-CM

## 2019-04-08 DIAGNOSIS — D696 Thrombocytopenia, unspecified: Secondary | ICD-10-CM

## 2019-04-08 DIAGNOSIS — D693 Immune thrombocytopenic purpura: Principal | ICD-10-CM | POA: Diagnosis present

## 2019-04-08 DIAGNOSIS — Z79899 Other long term (current) drug therapy: Secondary | ICD-10-CM

## 2019-04-08 DIAGNOSIS — G4700 Insomnia, unspecified: Secondary | ICD-10-CM | POA: Diagnosis not present

## 2019-04-08 DIAGNOSIS — Z6841 Body Mass Index (BMI) 40.0 and over, adult: Secondary | ICD-10-CM

## 2019-04-08 DIAGNOSIS — R233 Spontaneous ecchymoses: Secondary | ICD-10-CM

## 2019-04-08 DIAGNOSIS — Z8679 Personal history of other diseases of the circulatory system: Secondary | ICD-10-CM

## 2019-04-08 DIAGNOSIS — Z20822 Contact with and (suspected) exposure to covid-19: Secondary | ICD-10-CM | POA: Diagnosis present

## 2019-04-08 DIAGNOSIS — Z8619 Personal history of other infectious and parasitic diseases: Secondary | ICD-10-CM

## 2019-04-08 LAB — CBC WITH DIFFERENTIAL/PLATELET
Abs Immature Granulocytes: 0.14 10*3/uL — ABNORMAL HIGH (ref 0.00–0.07)
Basophils Absolute: 0 10*3/uL (ref 0.0–0.1)
Basophils Relative: 0 %
Eosinophils Absolute: 0.1 10*3/uL (ref 0.0–0.5)
Eosinophils Relative: 1 %
HCT: 40 % (ref 36.0–46.0)
Hemoglobin: 13 g/dL (ref 12.0–15.0)
Immature Granulocytes: 1 %
Lymphocytes Relative: 25 %
Lymphs Abs: 2.8 10*3/uL (ref 0.7–4.0)
MCH: 29 pg (ref 26.0–34.0)
MCHC: 32.5 g/dL (ref 30.0–36.0)
MCV: 89.1 fL (ref 80.0–100.0)
Monocytes Absolute: 1 10*3/uL (ref 0.1–1.0)
Monocytes Relative: 9 %
Neutro Abs: 6.9 10*3/uL (ref 1.7–7.7)
Neutrophils Relative %: 64 %
Platelets: <5 10*3/uL — CL (ref 150–400)
RBC: 4.49 MIL/uL (ref 3.87–5.11)
RDW: 14.9 % (ref 11.5–15.5)
WBC: 10.9 10*3/uL — ABNORMAL HIGH (ref 4.0–10.5)
nRBC: 0.4 % — ABNORMAL HIGH (ref 0.0–0.2)

## 2019-04-08 LAB — COMPREHENSIVE METABOLIC PANEL
ALT: 21 U/L (ref 0–44)
AST: 19 U/L (ref 15–41)
Albumin: 3.6 g/dL (ref 3.5–5.0)
Alkaline Phosphatase: 62 U/L (ref 38–126)
Anion gap: 9 (ref 5–15)
BUN: 12 mg/dL (ref 6–20)
CO2: 26 mmol/L (ref 22–32)
Calcium: 9 mg/dL (ref 8.9–10.3)
Chloride: 103 mmol/L (ref 98–111)
Creatinine, Ser: 0.6 mg/dL (ref 0.44–1.00)
GFR calc Af Amer: >60 mL/min (ref >60)
GFR calc non Af Amer: >60 mL/min (ref >60)
Glucose, Bld: 120 mg/dL — ABNORMAL HIGH (ref 70–99)
Potassium: 3.7 mmol/L (ref 3.5–5.1)
Sodium: 138 mmol/L (ref 135–145)
Total Bilirubin: 0.4 mg/dL (ref 0.3–1.2)
Total Protein: 6.9 g/dL (ref 6.5–8.1)

## 2019-04-08 LAB — LACTATE DEHYDROGENASE: LDH: 149 U/L (ref 98–192)

## 2019-04-08 LAB — PROTIME-INR
INR: 1.1 (ref 0.8–1.2)
Prothrombin Time: 13.8 seconds (ref 11.4–15.2)

## 2019-04-08 MED ORDER — DEXAMETHASONE SODIUM PHOSPHATE 10 MG/ML IJ SOLN
10.0000 mg | Freq: Once | INTRAMUSCULAR | Status: AC
  Administered 2019-04-08: 10 mg via INTRAVENOUS
  Filled 2019-04-08: qty 1

## 2019-04-08 MED ORDER — ACETAMINOPHEN 650 MG RE SUPP: 650.0000 mg | Freq: Four times a day (QID) | RECTAL | Status: DC | PRN

## 2019-04-08 MED ORDER — IMMUNE GLOBULIN (HUMAN) 10 GM/100ML IV SOLN
1.0000 g/kg | INTRAVENOUS | Status: DC
  Administered 2019-04-08: 115 g via INTRAVENOUS
  Filled 2019-04-08: qty 1200
  Filled 2019-04-08: qty 200

## 2019-04-08 MED ORDER — ACETAMINOPHEN 325 MG PO TABS
650.0000 mg | ORAL_TABLET | Freq: Four times a day (QID) | ORAL | Status: DC | PRN
  Administered 2019-04-09: 650 mg via ORAL
  Filled 2019-04-08 (×2): qty 2

## 2019-04-08 MED ORDER — ONDANSETRON HCL 4 MG PO TABS: 4.0000 mg | ORAL_TABLET | Freq: Four times a day (QID) | ORAL | Status: DC | PRN

## 2019-04-08 MED ORDER — ONDANSETRON HCL 4 MG/2ML IJ SOLN: 4.0000 mg | Freq: Four times a day (QID) | INTRAMUSCULAR | Status: DC | PRN

## 2019-04-08 MED ORDER — ELTROMBOPAG OLAMINE 50 MG PO TABS: 50.0000 mg | ORAL_TABLET | Freq: Every day | ORAL | Status: DC

## 2019-04-08 NOTE — ED Triage Notes (Signed)
Pt with hx of chronic ITP.  Reports gums bleeding and petechial rash to legs and R arm since yesterday.

## 2019-04-08 NOTE — ED Notes (Signed)
Dr. Jeraldine Loots notified of patient and orders received.

## 2019-04-08 NOTE — ED Provider Notes (Signed)
[facility] EMERGENCY DEPARTMENT Provider Note   CSN: [id number] Arrival date & time: 04/08/19  1612     History Chief Complaint  Patient presents with  . mouth bleeding  . petechial rash    [name] is a 44 y.o. female presenting for evaluation of rash and bleeding from the gums.  Patient states her symptoms began yesterday.  She noticed bleeding from her gums and a small rash.  This is mostly on her legs, but a little bit on her chest.  Patient states she had a nosebleed from her right nare yesterday, but this resolved by 8 PM yesterday and has not recurred.  Patient states this is how she has presented in the past when she has ITP.  She has been taking Promacta as prescribed.  She denies headache, vision changes, chest pain, shortness of breath, cough, nausea, vomiting, abdominal pain, hematuria.  She denies bleeding elsewhere.  HPI     Past Medical History:  Diagnosis Date  . Anemia   . Chronic ITP (idiopathic thrombocytopenia) (HCC)   . Depression   . Headache   . 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 Thrombocytopenia ([place]) 03/02/2019  . Borderline diabetes 10/16/2018  . Idiopathic thrombocytopenic purpura (ITP) (HCC) 10/14/2018  . Obesity, Class III, BMI 40-49.9 (morbid obesity) ([place]) 10/14/2018  . Hyperglycemia 10/14/2018  . 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 Chronic ITP (idiopathic thrombocytopenia) (HCC)   . 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 Menorrhagia with regular cycle   . Pressure ulcer 09/19/2015  . Gingival bleeding   . Chronic hepatitis B ([place]) 09/04/2015  . Subarachnoidal hemorrhage ([place]) 09/04/2015  . Severe thrombocytopenia ([place]) 09/04/2015  . Acute ITP ([place]) 08/28/2015    Past Surgical History:  Procedure Laterality Date  . BREAST SURGERY  biopsy  . SPLENECTOMY, TOTAL N/A 09/16/2015     Procedure: OPEN SPLENECTOMY;  Surgeon: Autumn Messing III, MD;  Location: [facility];  Service: General;  Laterality: N/A;     OB History    Gravida  6   Para  6   Term  6   Preterm  0   AB  0   Living  6     SAB  0   TAB  0   Ectopic  0   Multiple  0   Live Births  6           Family History  Problem Relation Age of Onset  . Diabetes Mother   . Breast cancer Mother     Social History   Tobacco Use  . Smoking status: Never Smoker  . Smokeless tobacco: Never Used  Substance Use Topics  . Alcohol use: No    Alcohol/week: 0.0 standard drinks  . Drug use: No    Home Medications Prior to Admission medications   Medication Sig Start Date End Date Taking? Authorizing Provider  acetaminophen (TYLENOL) 500 MG tablet Take 1-2 tablets (500-1,000 mg total) by mouth every 6 (six) hours as needed for mild pain or moderate pain. Patient taking differently: Take 500 mg by mouth daily as needed for mild pain or moderate pain.  09/22/15  Yes Burns, Arloa Koh, MD  eltrombopag (PROMACTA) 50 MG tablet Take 1 tablet (50 mg total) by mouth daily. Take on an empty stomach 1 hour before a meal or 2 hours after 04/07/19  Yes Pollyann Samples, NP  Multiple Vitamin (MULTIVITAMIN) capsule Take 1 capsule by mouth daily.   Yes [provider]    Allergies    Patient has no known allergies.  Review of Systems   Review of Systems  Skin: Positive for rash.  Hematological: Bruises/bleeds easily.  All other systems reviewed and are negative.   Physical Exam Updated Vital Signs BP 131/90 (BP Location: Left Wrist)   Pulse 69   Temp 98.6 F (37 C) (Oral)   Resp 16   Wt 112.5 kg   LMP 03/14/2019   SpO2 100%   BMI 50.09 kg/m   Physical Exam Vitals and nursing note reviewed.  Constitutional:      General: She is not in acute distress.    Appearance: She is well-developed.     Comments: Obese female resting comfortably in the bed in no acute distress  HENT:     Head:  Normocephalic and atraumatic.     Comments: Erythema of the gumline without active bleeding at this time. Eyes:     Extraocular Movements: Extraocular movements intact.     Conjunctiva/sclera: Conjunctivae normal.     Pupils: Pupils are equal, round, and reactive to light.  Cardiovascular:     Rate and Rhythm: Normal rate and regular rhythm.     Pulses: Normal pulses.  Pulmonary:     Effort: Pulmonary effort is normal. No respiratory distress.     Breath sounds: Normal breath sounds. No wheezing.  Abdominal:     General: There is no distension.     Palpations: Abdomen is soft. There is no mass.     Tenderness: There is no abdominal tenderness. There is no guarding or rebound.  Musculoskeletal:        General: Normal range of motion.     Cervical back: Normal range of motion and neck supple.  Skin:    General: Skin is warm and dry.     Findings: Rash present.     Comments: Nonblanching petechial rash noted mostly in the distal lower extremities.  Mild rash noted of the anterior upper chest.  Neurological:     Mental Status: She is alert and oriented to person, place, and time.     ED Results / Procedures / Treatments   Labs (all labs ordered are listed, but only abnormal results are displayed) Labs Reviewed  CBC WITH DIFFERENTIAL/PLATELET - Abnormal; Notable for the following components:      Result Value   WBC 10.9 (*)    Platelets <5 (*)    nRBC 0.4 (*)    Abs Immature Granulocytes 0.14 (*)    All other components within normal limits  COMPREHENSIVE METABOLIC PANEL - Abnormal; Notable for the following components:   Glucose, Bld 120 (*)    All other components within normal limits  SARS CORONAVIRUS 2 (TAT 6-24 HRS)  PROTIME-INR  LACTATE DEHYDROGENASE    EKG None  Radiology No results found.  Procedures .Critical Care Performed by: Alveria Apley, PA-C Authorized by: Alveria Apley, PA-C   Critical care provider statement:    Critical care time  (minutes):  35   Critical care time was exclusive of:  Separately billable procedures and treating other patients and teaching time   Critical care was necessary to treat or prevent imminent or life-threatening deterioration of the following conditions:  Circulatory failure   Critical care was time spent personally by me on the following activities:  Blood draw for specimens, development of treatment plan  with patient or surrogate, discussions with consultants, evaluation of patient's response to treatment, examination of patient, obtaining history from patient or surrogate, ordering and performing treatments and interventions, ordering and review of laboratory studies, ordering and review of radiographic studies, pulse oximetry, re-evaluation of patient's condition and review of old charts   I assumed direction of critical care for this patient from another provider in my specialty: no   Comments:     Pt with thrombocytopenia <5 requiring ivig and admission to the hospital.    (including critical care time)  Medications Ordered in ED Medications  Immune Globulin 10% (PRIVIGEN) IV infusion 115 g (has no administration in time range)  dexamethasone (DECADRON) injection 10 mg (has no administration in time range)  dexamethasone (DECADRON) injection 10 mg (10 mg Intravenous Given 04/08/19 1933)    ED Course  I have reviewed the triage vital signs and the nursing notes.  Pertinent labs & imaging results that were available during my care of the patient were reviewed by me and considered in my medical decision making (see chart for details).    MDM Rules/Calculators/A&P                      Patient presenting for evaluation of bleeding of the gum, nose, and petechial rash.  On exam, patient appears nontoxic.  Vitals are stable.  She has erythema of the gum, but no active bleeding.  History of ITP. Labs obtained from triage show thrombocytopenia at less than 5, consistent with ITP.  Will consult  with heme-onc.  Discussed with Dr. Aaron Edelman who recommends 10 mg IV x2, and starting 60 mg of prednisone p.o. daily tomorrow.  Recommends 1 mg/kg of IVIG daily for 2 days.  When platelets are over 20,000, consider discharge and follow-up outpatient.  Recommends admission to hospitalist service and they will consult.  Discussed with Dr. Toniann Fail from [facility] hospital[facility] service, patient to be admitted.  Final Clinical Impression(s) / ED Diagnoses Final diagnoses:  Acute ITP [facility])    Rx / DC Orders ED Discharge Orders    None       Alveria Apley, PA-C 04/08/19 2107    Glynn Octave, MD 04/08/19 2317

## 2019-04-08 NOTE — H&P (Addendum)
History and Physical    [name] HWT:[id number] DOB: 12-18-1975 DOA: 04/08/2019  PCP: Malachy Mood, MD  Patient coming from: Home.  Chief Complaint: Bleeding gums in particular rash.  Engineer, structural used.  HPI: [name] is a 44 y.o. female with history of chronic ITP was recently admitted last month for exacerbation of ITP presents to the ER with complaints of bleeding gums and petechial rash over the last 24 hours.  Denies any headache abdominal pain fever chills nausea vomiting diarrhea.  ED Course: In the ER patient was afebrile Covid test was negative.  Platelet count was less than 5.  Hemoglobin 13 WBC 10.9.  ER physician discussed with on-call oncologist Dr. Darnelle Catalan who advised to give patient 2 dose of IV Decadron and start patient on IVIG for 2 doses.  Following which patient is to be on prednisone.  Review of Systems: As per HPI, rest all negative.   Past Medical History:  Diagnosis Date  . Anemia   . Chronic ITP (idiopathic thrombocytopenia) (HCC)   . Depression   . Headache   . 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 BREAST SURGERY  biopsy  . 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 She has never used smokeless tobacco. She reports that she does not drink alcohol or use drugs.  No Known Allergies  Family History  Problem Relation Age of Onset  . Diabetes Mother   . Breast cancer Mother     Prior to Admission medications   Medication Sig Start Date End Date Taking? Authorizing Provider  acetaminophen (TYLENOL) 500 MG tablet Take 1-2 tablets (500-1,000 mg total) by mouth every 6 (six) hours as needed for mild pain or moderate pain. Patient taking differently: Take 500 mg by mouth  daily as needed for mild pain or moderate pain.  09/22/15  Yes Burns, Tinnie Gens, MD  eltrombopag (PROMACTA) 50 MG tablet Take 1 tablet (50 mg total) by mouth daily. Take on an empty stomach 1 hour before a meal or 2 hours after 04/07/19  Yes Pollyann Samples, NP  Multiple Vitamin (MULTIVITAMIN) capsule Take 1 capsule by mouth daily.   Yes [provider]    Physical Exam: Constitutional: Moderately built and nourished. Vitals:   04/08/19 1921 04/08/19 1945 04/08/19 1954 04/08/19 2014  BP:    131/90  Pulse: 70 75 70 69  Resp:  20 19 16   Temp:    98.6 F (37 C)  TempSrc:    Oral  SpO2: 96% 97% 98% 100%  Weight:    111.1 kg  Height:    4\' 11"  (1.499 m)   Eyes: Anicteric no pallor. ENMT: No discharge from the ears eyes nose or mouth. Neck: No mass felt.  No neck rigidity. Respiratory: No rhonchi or crepitations. Cardiovascular: S1-S2 heard. Abdomen: Soft nontender bowel sounds present. Musculoskeletal: No edema.  No joint effusion. Skin: No rash. Neurologic: Alert awake oriented to time place and person.  Moves all extremities. Psychiatric: Appears normal per normal affect.   Labs on Admission: I have personally reviewed following labs and imaging studies  CBC: Recent Labs  Lab 04/08/19 1650  WBC 10.9*  NEUTROABS 6.9  HGB 13.0  HCT 40.0  MCV 89.1  PLT <5*   Basic Metabolic Panel: Recent Labs  Lab 04/08/19 1650  NA 138  K 3.7  CL 103  CO2 26  GLUCOSE 120*  BUN 12  CREATININE 0.60  CALCIUM 9.0   GFR: Estimated Creatinine Clearance: 99.7 mL/min (by C-G formula based on SCr of 0.6 mg/dL). Liver Function Tests: Recent Labs  Lab 04/08/19 1650  AST 19  ALT 21  ALKPHOS 62  BILITOT 0.4  PROT 6.9  ALBUMIN 3.6   No results for input(s): LIPASE, AMYLASE in the last 168 hours. No results for input(s): AMMONIA in the last 168 hours. Coagulation Profile: Recent Labs  Lab 04/08/19 1650  INR 1.1   Cardiac Enzymes: No results for input(s): CKTOTAL, CKMB,  CKMBINDEX, TROPONINI in the last 168 hours. BNP (last 3 results) No results for input(s): PROBNP in the last 8760 hours. HbA1C: No results for input(s): HGBA1C in the last 72 hours. CBG: No results for input(s): GLUCAP in the last 168 hours. Lipid Profile: No results for input(s): CHOL, HDL, LDLCALC, TRIG, CHOLHDL, LDLDIRECT in the last 72 hours. Thyroid Function Tests: No results for input(s): TSH, T4TOTAL, FREET4, T3FREE, THYROIDAB in the last 72 hours. Anemia Panel: No results for input(s): VITAMINB12, FOLATE, FERRITIN, TIBC, IRON, RETICCTPCT in the last 72 hours. Urine analysis:    Component Value Date/Time   COLORURINE YELLOW 11/16/2018 1053   APPEARANCEUR CLEAR 11/16/2018 1053   LABSPEC 1.020 11/16/2018 1053   PHURINE 6.0 11/16/2018 1053   GLUCOSEU NEGATIVE 11/16/2018 1053   HGBUR NEGATIVE 11/16/2018 [place] 11/16/2018 1053   BILIRUBINUR neg 10/12/2015 0852   KETONESUR NEGATIVE 11/16/2018 1053   PROTEINUR NEGATIVE 11/16/2018 1053   UROBILINOGEN 0.2 10/12/2015 0852   UROBILINOGEN 0.2 10/02/2011 1133   NITRITE NEGATIVE 11/16/2018 1053   LEUKOCYTESUR SMALL (A) 11/16/2018 1053   Sepsis Labs: @LABRCNTIP (procalcitonin:4,lacticidven:4) )No results found for this or any previous visit (from the past 240 hour(s)).   Radiological Exams on Admission: No results found.   Assessment/Plan Principal Problem:   Acute ITP (HCC)    1. Acute on chronic ITP for which patient has been started on IV Decadron 2 doses and IVIG and 2 doses have been ordered for this.  Prednisone to be started on January 2021.  Hematology oncology following inpatient for further recommendation.  Patient is also on Promacta. 2. Mild leukocytosis.  No fever.  Given that patient has acute ITP with undetectable platelets will need close monitoring for any further deterioration.  Inpatient status.   DVT prophylaxis: SCDs due to severe thrombocytopenia. Code Status: Full code. Family  Communication: Discussed with patient. Disposition Plan: Home. Consults called: Oncologist. Admission status: Inpatient.   Rise Patience MD [facility] Pager [phone].  If 7PM-7AM, please contact night-coverage [email or web address] Password [facility]  04/08/2019, 10:42 PM

## 2019-04-08 NOTE — ED Notes (Signed)
ED TO INPATIENT HANDOFF REPORT  ED Nurse Name and Phone #: [phone] Wendie Simmer., RN  S Name/Age/Gender [name] 44 y.o. female Room/Bed: 023C/023C  Code Status   Code Status: Prior  Home/SNF/Other Home Patient oriented to: self, place, time and situation Is this baseline? Yes   Triage Complete: Triage complete  Chief Complaint Acute ITP (HCC) [D69.3]  Triage Note Pt with hx of chronic ITP.  Reports gums bleeding and petechial rash to legs and R arm since yesterday.    Allergies No Known Allergies  Level of Care/Admitting Diagnosis ED Disposition    ED Disposition Condition Comment   Admit  Hospital Area: [facility] [[id number]]  Level of Care: Telemetry Medical [104]  Covid Evaluation: Asymptomatic Screening Protocol (No Symptoms)  Diagnosis: Acute ITP [facility]) [[id number]]  Admitting Physician: Eduard Clos [phone]  Attending Physician: Eduard Clos [phone]  Estimated length of stay: past midnight tomorrow  Certification:: I certify this patient will need inpatient services for at least 2 midnights       B Medical/Surgery History Past Medical History:  Diagnosis Date  . Anemia   . Chronic ITP (idiopathic thrombocytopenia) (HCC)   . Depression   . Headache   . 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 BREAST SURGERY  biopsy  . 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   09/19/15    1600     1297          Intake/Output Last 24 hours No intake or output data in the 24 hours ending 04/08/19 1942  Labs/Imaging Results for orders placed or performed during the hospital encounter of 04/08/19 (from the past 48 hour(s))  CBC with Differential     Status: Abnormal   Collection Time: 04/08/19  4:50 PM  Result Value Ref Range   WBC 10.9 (H) 4.0 - 10.5 K/uL   RBC 4.49 3.87 - 5.11 MIL/uL   Hemoglobin 13.0 12.0 - 15.0 g/dL   HCT 66.4 40.3 - 47.4 %   MCV 89.1 80.0 - 100.0 fL   MCH 29.0 26.0 - 34.0 pg   MCHC 32.5 30.0 - 36.0 g/dL   RDW 25.9 56.3 - 87.5 %   Platelets <5 (LL) 150 - 400 K/uL    Comment: REPEATED TO VERIFY PLATELET COUNT CONFIRMED BY SMEAR SPECIMEN CHECKED FOR CLOTS Immature Platelet Fraction may be clinically indicated, consider ordering this additional test [id number] CRITICAL RESULT CALLED TO, READ BACK BY AND VERIFIED WITH: R HARDY,RN 1803 04/08/2019 D BRADLEY    nRBC 0.4 (H) 0.0 - 0.2 %   Neutrophils Relative % 64 %   Neutro Abs 6.9 1.7 - 7.7 K/uL   Lymphocytes Relative 25 %   Lymphs Abs 2.8 0.7 - 4.0 K/uL   Monocytes Relative 9 %   Monocytes Absolute 1.0 0.1 - 1.0 K/uL   Eosinophils  Relative 1 %   Eosinophils Absolute 0.1 0.0 - 0.5 K/uL   Basophils Relative 0 %   Basophils Absolute 0.0 0.0 - 0.1 K/uL   Immature Granulocytes 1 %   Abs Immature Granulocytes 0.14 (H) 0.00 - 0.07 K/uL    Comment: Performed at [facility] [address], [place], [place] [address]  Comprehensive metabolic panel     Status: Abnormal   Collection Time: 04/08/19  4:50 PM  Result Value Ref Range   Sodium 138 135 - 145 mmol/L   Potassium 3.7 3.5 - 5.1 mmol/L   Chloride 103 98 - 111 mmol/L   CO2 26 22 - 32 mmol/L   Glucose, Bld 120 (H) 70 - 99 mg/dL   BUN 12 6 - 20 mg/dL   Creatinine, Ser 0.60 0.44 - 1.00 mg/dL   Calcium 9.0 8.9 - 10.3 mg/dL   Total Protein 6.9 6.5 - 8.1 g/dL   Albumin 3.6 3.5 - 5.0 g/dL   AST 19 15 - 41 U/L    ALT 21 0 - 44 U/L   Alkaline Phosphatase 62 38 - 126 U/L   Total Bilirubin 0.4 0.3 - 1.2 mg/dL   GFR calc non Af Amer >60 >60 mL/min   GFR calc Af Amer >60 >60 mL/min   Anion gap 9 5 - 15    Comment: Performed at [facility] [address] [place], [place] [address]  Protime-INR     Status: None   Collection Time: 04/08/19  4:50 PM  Result Value Ref Range   Prothrombin Time 13.8 11.4 - 15.2 seconds   INR 1.1 0.8 - 1.2    Comment: (NOTE) INR goal varies based on device and disease states. Performed at [facility], [place] [address] [place], [place] [address]   Lactate dehydrogenase     Status: None   Collection Time: 04/08/19  4:50 PM  Result Value Ref Range   LDH 149 98 - 192 U/L    Comment: Performed at [facility], [place] [address], [place], [place] [address]   No results found.  Pending Labs [employer] (From admission, onward)    Start     Ordered   04/08/19 1829  SARS CORONAVIRUS 2 (TAT 6-24 HRS) Nasopharyngeal Nasopharyngeal Swab  (Tier 3 (TAT 6-24 hrs))  Once,   STAT    Question Answer Comment  Is this test for diagnosis or screening Screening   Symptomatic for COVID-19 as defined by CDC No   Hospitalized for COVID-19 No   Admitted to ICU for COVID-19 No   Previously tested for COVID-19 Yes   Resident in a congregate (group) care setting No   Employed in healthcare setting No   Pregnant No      04/08/19 1828          Vitals/Pain Today's Vitals   04/08/19 1641 04/08/19 1800 04/08/19 1920 04/08/19 1921  BP:   117/78   Pulse:    70  Resp:      Temp:      TempSrc:      SpO2:    96%  Weight:  112.5 kg    PainSc: 0-No pain       Isolation Precautions No active isolations  Medications Medications  Immune Globulin 10% (PRIVIGEN) IV infusion 115 g (has no administration in time range)  dexamethasone (DECADRON) injection 10 mg (has no administration in time range)  dexamethasone (DECADRON) injection 10 mg (10 mg Intravenous Given  04/08/19 1933)    Mobility walks Low fall risk   Focused Assessments Cardiac Assessment Handoff:    No results found for: CKTOTAL, CKMB, CKMBINDEX, TROPONINI Lab Results  Component Value Date   DDIMER 0.39 11/16/2018   Does the Patient currently have chest pain? No     R Recommendations: See Admitting Provider Note  Report given to:   Additional Notes:  Needs spanish interpreter

## 2019-04-08 NOTE — Progress Notes (Signed)
PRELIMINARY NOTE: [name] is a 44 y/o [place] woman followed by my partner Dr Mosetta Putt for a history of ITP.  She was seen most recently in our office on 03/19/2019 at which point her platelet count was 266,000, after recent treatment with IVIG (last dose 03/05/2019).  She now presents again to the emergency room with petechial bleeding and a platelet count of less than 5000.  Acutely I recommended dexamethasone 10 mg IV x2, and then start 60 mg of prednisone daily with breakfast 04/09/2019.  She should also received IVIG 1 g/kg daily x2.  That can be concurrent with the steroids.  With this treatment I would anticipate she will be able to be discharged in 2 to 5 days, with a platelet count of greater than 20,000.   I will alert Dr. Mosetta Putt of the admission.  Greatly appreciate your help to this patient.

## 2019-04-09 ENCOUNTER — Encounter (HOSPITAL_COMMUNITY): Payer: Self-pay | Admitting: Internal Medicine

## 2019-04-09 LAB — CBC WITH DIFFERENTIAL/PLATELET
Abs Immature Granulocytes: 0.18 10*3/uL — ABNORMAL HIGH (ref 0.00–0.07)
Basophils Absolute: 0 10*3/uL (ref 0.0–0.1)
Basophils Relative: 0 %
Eosinophils Absolute: 0 10*3/uL (ref 0.0–0.5)
Eosinophils Relative: 0 %
HCT: 39.5 % (ref 36.0–46.0)
Hemoglobin: 12.6 g/dL (ref 12.0–15.0)
Immature Granulocytes: 2 %
Lymphocytes Relative: 12 %
Lymphs Abs: 1.4 10*3/uL (ref 0.7–4.0)
MCH: 28.4 pg (ref 26.0–34.0)
MCHC: 31.9 g/dL (ref 30.0–36.0)
MCV: 89 fL (ref 80.0–100.0)
Monocytes Absolute: 0.1 10*3/uL (ref 0.1–1.0)
Monocytes Relative: 0 %
Neutro Abs: 9.7 10*3/uL — ABNORMAL HIGH (ref 1.7–7.7)
Neutrophils Relative %: 86 %
Platelets: <5 10*3/uL — CL (ref 150–400)
RBC: 4.44 MIL/uL (ref 3.87–5.11)
RDW: 14.8 % (ref 11.5–15.5)
WBC: 11.3 10*3/uL — ABNORMAL HIGH (ref 4.0–10.5)
nRBC: 0.2 % (ref 0.0–0.2)

## 2019-04-09 LAB — COMPREHENSIVE METABOLIC PANEL
ALT: 21 U/L (ref 0–44)
AST: 17 U/L (ref 15–41)
Albumin: 3.7 g/dL (ref 3.5–5.0)
Alkaline Phosphatase: 65 U/L (ref 38–126)
Anion gap: 7 (ref 5–15)
BUN: 14 mg/dL (ref 6–20)
CO2: 24 mmol/L (ref 22–32)
Calcium: 9.2 mg/dL (ref 8.9–10.3)
Chloride: 104 mmol/L (ref 98–111)
Creatinine, Ser: 0.65 mg/dL (ref 0.44–1.00)
GFR calc Af Amer: >60 mL/min (ref >60)
GFR calc non Af Amer: >60 mL/min (ref >60)
Glucose, Bld: 211 mg/dL — ABNORMAL HIGH (ref 70–99)
Potassium: 3.8 mmol/L (ref 3.5–5.1)
Sodium: 135 mmol/L (ref 135–145)
Total Bilirubin: 0.2 mg/dL — ABNORMAL LOW (ref 0.3–1.2)
Total Protein: 8.3 g/dL — ABNORMAL HIGH (ref 6.5–8.1)

## 2019-04-09 LAB — SARS CORONAVIRUS 2 (TAT 6-24 HRS): SARS Coronavirus 2: NEGATIVE

## 2019-04-09 MED ORDER — ELTROMBOPAG OLAMINE 25 MG PO TABS
50.0000 mg | ORAL_TABLET | Freq: Every day | ORAL | Status: DC
  Administered 2019-04-09: 50 mg via ORAL

## 2019-04-09 MED ORDER — ROMIPLOSTIM 250 MCG ~~LOC~~ SOLR
2.0000 ug/kg | Freq: Once | SUBCUTANEOUS | Status: AC
  Administered 2019-04-09: 220 ug via SUBCUTANEOUS
  Filled 2019-04-09: qty 0.44

## 2019-04-09 MED ORDER — PREDNISONE 50 MG PO TABS
60.0000 mg | ORAL_TABLET | Freq: Every day | ORAL | Status: DC
  Administered 2019-04-09: 60 mg via ORAL
  Filled 2019-04-09: qty 1

## 2019-04-09 MED ORDER — DEXAMETHASONE 4 MG PO TABS
20.0000 mg | ORAL_TABLET | Freq: Every day | ORAL | Status: DC
  Administered 2019-04-10: 20 mg via ORAL
  Filled 2019-04-09: qty 5

## 2019-04-09 MED ORDER — ZOLPIDEM TARTRATE 5 MG PO TABS
5.0000 mg | ORAL_TABLET | Freq: Every evening | ORAL | Status: DC | PRN
  Administered 2019-04-09: 5 mg via ORAL
  Filled 2019-04-09: qty 1

## 2019-04-09 NOTE — Progress Notes (Signed)
Hematology note   I was notified by my partner Dr. Darnelle Catalan about her admission. I have reviewed her lab and chart, and made some adjustment in her treatments.   I spoke with Dr, Dairl Ponder, and will transfer her to my service. I plan to see her later today.  I also talked to her daughter and updated her.   Malachy Mood  04/09/2019

## 2019-04-09 NOTE — Plan of Care (Signed)
  Problem: Education: Goal: Knowledge of General Education information will improve Description: Including pain rating scale, medication(s)/side effects and non-pharmacologic comfort measures Outcome: Progressing   Problem: Health Behavior/Discharge Planning: Goal: Ability to manage health-related needs will improve Outcome: Progressing   Problem: Clinical Measurements: Goal: Ability to maintain clinical measurements within normal limits will improve Outcome: Progressing Goal: Respiratory complications will improve Outcome: Progressing Goal: Cardiovascular complication will be avoided Outcome: Progressing   Problem: Activity: Goal: Risk for activity intolerance will decrease Outcome: Progressing   Problem: Nutrition: Goal: Adequate nutrition will be maintained Outcome: Progressing   Problem: Pain Managment: Goal: General experience of comfort will improve Outcome: Progressing   Problem: Safety: Goal: Ability to remain free from injury will improve Outcome: Progressing   Problem: Skin Integrity: Goal: Risk for impaired skin integrity will decrease Outcome: Progressing   

## 2019-04-09 NOTE — Plan of Care (Signed)
  Problem: Education: Goal: Knowledge of General Education information will improve Description Including pain rating scale, medication(s)/side effects and non-pharmacologic comfort measures Outcome: Progressing   

## 2019-04-09 NOTE — Progress Notes (Signed)
[name]   DOB:Jan 23, 1976   UV#:[id number]   KVQ#:[id number]  Hematology follow up  Note   Subjective: Pt is well-known to me, under my care for her ITP.  She presented with gum bleeding yesterday to ED, platelet less than 5, was admitted last night.  She received IV dexamethasone, and IVIG.  She developed body achiness and burning sensation, and a mild headaches after admission, likely from IV dexamethasone.  She denies any gum or other bleeding since admission.  She did not sleep well last night, no other complaints.   Objective:  Vitals:   04/09/19 0425 04/09/19 1431  BP: 108/67 (!) 105/49  Pulse: 75 85  Resp: 20 19  Temp: 98.7 F (37.1 C) 98.5 F (36.9 C)  SpO2: 99% 97%    Body mass index is 49.47 kg/m.  Intake/Output Summary (Last 24 hours) at 04/09/2019 1809 Last data filed at 04/09/2019 0440 Gross per 24 hour  Intake 1200 ml  Output --  Net 1200 ml     Sclerae unicteric  Oropharynx clear, no gum or mucosal bleeding  No peripheral adenopathy  Lungs clear -- no rales or rhonchi  Heart regular rate and rhythm  Abdomen benign  MSK no focal spinal tenderness, no peripheral edema  Neuro nonfocal  Skin: No petechia or ecchymosis   CBG (last 3)  No results for input(s): GLUCAP in the last 72 hours.   Labs:  Lab Results  Component Value Date   WBC 11.3 (H) 04/09/2019   HGB 12.6 04/09/2019   HCT 39.5 04/09/2019   MCV 89.0 04/09/2019   PLT <5 (LL) 04/09/2019   NEUTROABS 9.7 (H) 04/09/2019    Urine Studies No results for input(s): UHGB, CRYS in the last 72 hours.  Invalid input(s): UACOL, UAPR, USPG, UPH, UTP, UGL, UKET, UBIL, UNIT, UROB, ULEU, UEPI, UWBC, URBC, UBAC, CAST, UCOM, BILUA  Basic Metabolic Panel: Recent Labs  Lab 04/08/19 1650 04/09/19 0110  NA 138 135  K 3.7 3.8  CL 103 104  CO2 26 24  GLUCOSE 120* 211*  BUN 12 14  CREATININE 0.60 0.65  CALCIUM 9.0 9.2   GFR Estimated Creatinine Clearance: 99.7 mL/min (by C-G formula based on  SCr of 0.65 mg/dL). Liver Function Tests: Recent Labs  Lab 04/08/19 1650 04/09/19 0110  AST 19 17  ALT 21 21  ALKPHOS 62 65  BILITOT 0.4 0.2*  PROT 6.9 8.3*  ALBUMIN 3.6 3.7   No results for input(s): LIPASE, AMYLASE in the last 168 hours. No results for input(s): AMMONIA in the last 168 hours. Coagulation profile Recent Labs  Lab 04/08/19 1650  INR 1.1    CBC: Recent Labs  Lab 04/08/19 1650 04/09/19 0110  WBC 10.9* 11.3*  NEUTROABS 6.9 9.7*  HGB 13.0 12.6  HCT 40.0 39.5  MCV 89.1 89.0  PLT <5* <5*   Cardiac Enzymes: No results for input(s): CKTOTAL, CKMB, CKMBINDEX, TROPONINI in the last 168 hours. BNP: Invalid input(s): POCBNP CBG: No results for input(s): GLUCAP in the last 168 hours. D-Dimer No results for input(s): DDIMER in the last 72 hours. Hgb A1c No results for input(s): HGBA1C in the last 72 hours. Lipid Profile No results for input(s): CHOL, HDL, LDLCALC, TRIG, CHOLHDL, LDLDIRECT in the last 72 hours. Thyroid function studies No results for input(s): TSH, T4TOTAL, T3FREE, THYROIDAB in the last 72 hours.  Invalid input(s): FREET3 Anemia work up No results for input(s): VITAMINB12, FOLATE, FERRITIN, TIBC, IRON, RETICCTPCT in the last 72 hours. Microbiology Recent Results (  from the past 240 hour(s))  SARS CORONAVIRUS 2 (TAT 6-24 HRS) Nasopharyngeal Nasopharyngeal Swab     Status: None   Collection Time: 04/08/19  7:26 PM   Specimen: Nasopharyngeal Swab  Result Value Ref Range Status   SARS Coronavirus 2 NEGATIVE NEGATIVE Final    Comment: (NOTE) SARS-CoV-2 target nucleic acids are NOT DETECTED. The SARS-CoV-2 RNA is generally detectable in upper and lower respiratory specimens during the acute phase of infection. Negative results do not preclude SARS-CoV-2 infection, do not rule out co-infections with other pathogens, and should not be used as the sole basis for treatment or other patient management decisions. Negative results must be  combined with clinical observations, patient history, and epidemiological information. The expected result is Negative. Fact Sheet for Patients: SugarRoll.be Fact Sheet for Healthcare Providers: https://www.woods-mathews.com/ This test is not yet approved or cleared by the [place] FDA and  has been authorized for detection and/or diagnosis of SARS-CoV-2 by FDA under an Emergency Use Authorization (EUA). This EUA will remain  in effect (meaning this test can be used) for the duration of the COVID-19 declaration under Section 56 4(b)(1) of the Act, 21 U.S.C. section 360bbb-3(b)(1), unless the authorization is terminated or revoked sooner. Performed at [facility], [place] [address] [place], [place] [address]       Studies:  No results found.  Assessment: 44 y.o. female, with previously refractory ITP, status post multiple lines treatment and splenectomy, resolved eventually.  She now presented with recurrent severe thrombocytopenia.  1. acute flare of ITP 2. History of subarachnoid hemorrhage 3. Insomnia   Plan: -she has received dexamethasone IV 20 mg yesterday, prednisone 60 mg this morning, and a 1 dose of IVIG 1mg /kg last night.  -I will change steroids to oral dexamethasone 20 mg daily for 2 more days, starting tomorrow.  -I do not think she needs second dose of IVIG tonight.  I have canceled.  -She previously responded very well to Nplate, will give one dose 2mg /kg today, it may take 2-3 days to increase her plt  -I will stop her oral Promacta.   -We will follow up CBC daily.  She has no active bleeding, no need platelet transfusion.   -I will switch her Promacta to Nplate after discharge  -plan to discharge when plt>10K  -I ordered Ambien 5 mg as needed for her insomnia -I spoke with her daughter and updated her this morning.    Truitt Merle, MD 04/09/2019

## 2019-04-10 LAB — CBC WITH DIFFERENTIAL/PLATELET
Abs Immature Granulocytes: 0.15 10*3/uL — ABNORMAL HIGH (ref 0.00–0.07)
Basophils Absolute: 0.1 10*3/uL (ref 0.0–0.1)
Basophils Relative: 0 %
Eosinophils Absolute: 0 10*3/uL (ref 0.0–0.5)
Eosinophils Relative: 0 %
HCT: 36.1 % (ref 36.0–46.0)
Hemoglobin: 11.7 g/dL — ABNORMAL LOW (ref 12.0–15.0)
Immature Granulocytes: 1 %
Lymphocytes Relative: 17 %
Lymphs Abs: 3.5 10*3/uL (ref 0.7–4.0)
MCH: 28.4 pg (ref 26.0–34.0)
MCHC: 32.4 g/dL (ref 30.0–36.0)
MCV: 87.6 fL (ref 80.0–100.0)
Monocytes Absolute: 1.5 10*3/uL — ABNORMAL HIGH (ref 0.1–1.0)
Monocytes Relative: 7 %
Neutro Abs: 15.8 10*3/uL — ABNORMAL HIGH (ref 1.7–7.7)
Neutrophils Relative %: 75 %
Platelets: 85 10*3/uL — ABNORMAL LOW (ref 150–400)
RBC: 4.12 MIL/uL (ref 3.87–5.11)
RDW: 15 % (ref 11.5–15.5)
WBC: 21 10*3/uL — ABNORMAL HIGH (ref 4.0–10.5)
nRBC: 0.2 % (ref 0.0–0.2)

## 2019-04-10 LAB — BASIC METABOLIC PANEL
Anion gap: 6 (ref 5–15)
BUN: 14 mg/dL (ref 6–20)
CO2: 24 mmol/L (ref 22–32)
Calcium: 8.9 mg/dL (ref 8.9–10.3)
Chloride: 106 mmol/L (ref 98–111)
Creatinine, Ser: 0.66 mg/dL (ref 0.44–1.00)
GFR calc Af Amer: >60 mL/min (ref >60)
GFR calc non Af Amer: >60 mL/min (ref >60)
Glucose, Bld: 143 mg/dL — ABNORMAL HIGH (ref 70–99)
Potassium: 3.8 mmol/L (ref 3.5–5.1)
Sodium: 136 mmol/L (ref 135–145)

## 2019-04-10 NOTE — Plan of Care (Signed)
Pt for discharge going home, alert and oriented, ambulatory, tolerates her meal, no complain of pain, given her personal belongings, discontinued peripheral IV line, given health teachings, next appointment and due med explained and understood, waiting for her husband to pick her up.

## 2019-04-10 NOTE — Progress Notes (Signed)
[facility]   [phone] Fax:([phone]   Clinic Follow up Note   Patient Care Team: Malachy Mood, MD as PCP - General (Hematology) Levert Feinstein, MD as Consulting Physician (Oncology) [name] (Hematology and Oncology) Elwin Mocha, MD as Consulting Physician (Ophthalmology) Justice Deeds, MD (Internal Medicine)  Date of Service:  04/16/2019  CHIEF COMPLAINT: F/u of ITP   CURRENT THERAPY:  -Promacta 25mg  daily starting 10/22/18. Stopped 04/08/18 due to recurrent flares.  -Nplate q2weeks starting in 2 weeks  INTERVAL HISTORY:  [name] is here for a follow up of ITP. She was recently hospitalized due to another ITP flare. She presents to the clinic with her spanish interpretor. She denies bleeding since her hospitalization. She notes she did have bruising which has resolved. She notes she has been having HA's which was present in the hospital. This has not worsened. She notes still having painful menorrhagia.    REVIEW OF SYSTEMS:   Constitutional: Denies fevers, chills or abnormal weight loss (+) Stable HA's  Eyes: Denies blurriness of vision Ears, nose, mouth, throat, and face: Denies mucositis or sore throat Respiratory: Denies cough, dyspnea or wheezes Cardiovascular: Denies palpitation, chest discomfort or lower extremity swelling Gastrointestinal:  Denies nausea, heartburn or change in bowel habits Skin: Denies abnormal skin rashes Lymphatics: Denies new lymphadenopathy or easy bruising Neurological:Denies numbness, tingling or new weaknesses Behavioral/Psych: Mood is stable, no new changes  All other systems were reviewed with the patient and are negative.  MEDICAL HISTORY:  Past Medical History:  Diagnosis Date  . Anemia   . Chronic ITP (idiopathic thrombocytopenia) (HCC)   . Depression   . Headache   . 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    SURGICAL  HISTORY: Past Surgical History:  Procedure Laterality Date  . BREAST SURGERY  biopsy  . 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  ALLERGIES:  has No Known Allergies.  MEDICATIONS:  Current Outpatient Medications  Medication Sig Dispense Refill  . acetaminophen (TYLENOL) 500 MG tablet Take 1-2 tablets (500-1,000 mg total) by mouth every 6 (six) hours as needed for mild pain or moderate pain. (Patient taking differently: Take 500 mg by mouth daily as needed for mild pain or moderate pain. ) 30 tablet 0   No current facility-administered medications for this visit.    PHYSICAL EXAMINATION: ECOG PERFORMANCE STATUS: 1 - Symptomatic but completely ambulatory  Vitals:   04/16/19 1248  BP: 132/78  Pulse: (!) 121  Resp: 18  Temp: (!) 97.4 F (36.3 C)  SpO2: 99%   Filed Weights   04/16/19 1248  Weight: 242 lb 14.4 oz (110.2 kg)    Due to COVID19 we will limit examination to appearance. Patient had no complaints.  GENERAL:alert, no distress and comfortable SKIN: skin color normal, no rashes or significant lesions EYES: normal, Conjunctiva are pink and non-injected, sclera clear  NEURO: alert & oriented x 3 with fluent speech   LABORATORY DATA:  I have reviewed the data as listed CBC Latest Ref Rng & Units 04/16/2019 04/10/2019 04/09/2019  WBC 4.0 - 10.5 K/uL 12.4(H) 21.0(H) 11.3(H)  Hemoglobin 12.0 - 15.0 g/dL 04/11/2019 11.7(L) 12.6  Hematocrit 36.0 - 46.0 % 40.4  36.1 39.5  Platelets 150 - 400 K/uL 406(H) 85(L) <5(LL)     CMP Latest Ref Rng & Units 04/10/2019 04/09/2019 04/08/2019  Glucose 70 - 99 mg/dL 143(H) 211(H) 120(H)  BUN 6 - 20 mg/dL 14 14 12   Creatinine 0.44 - 1.00 mg/dL 0.66 0.65 0.60  Sodium 135 - 145 mmol/L 136 135 138  Potassium 3.5 - 5.1 mmol/L  3.8 3.8 3.7  Chloride 98 - 111 mmol/L 106 104 103  CO2 22 - 32 mmol/L 24 24 26   Calcium 8.9 - 10.3 mg/dL 8.9 9.2 9.0  Total Protein 6.5 - 8.1 g/dL - 8.3(H) 6.9  Total Bilirubin 0.3 - 1.2 mg/dL - 0.2(L) 0.4  Alkaline Phos 38 - 126 U/L - 65 62  AST 15 - 41 U/L - 17 19  ALT 0 - 44 U/L - 21 21      RADIOGRAPHIC STUDIES: I have personally reviewed the radiological images as listed and agreed with the findings in the report. No results found.   ASSESSMENT & PLAN:  [name] is a 44 y.o. female with    1. 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 -She was hospitalizedagainon 10/14/18 for acute ITP flare. She was treated with dexamethasone, andone doseNPlate.CT head was negative forintracranial hemorrhage. Platelets improved from 17K to 36K.  -Due to her recurrent ITP flare, she is currently on maintenance TPO oralPromacta 25mg  daily starting 10/22/18. She is currently on patient assistance through 10/2019.  -She has had 2 more ITP flares in 02/2019 (treated with dexa and NPlate and 1 dose IVIG and increased to 50mg  Promacta with discharge) and 03/2019 (treated with dexa, prednisone, Nplate and 1 dose IVIG) -Since hospital discharge she has stable HA's. CBC reviewed, WBC 12.4, plt 406K. Plan to switch her Promacta to Nplate infusions [id number] starting in 2 weeks. She has stopped promacta since last hospitalization  -I encouraged her to watch for bleeding of gums or LE petechia. If present she should contact our clinic as we can treat as outpatient. I discussed she may bleeding more on her periods when plt ct are lower.  -F/u in 2 months.   2.History of  small foci ofsubarachnoid hemorrhage -Secondary to #1 -recovered well overall. Will continue to monitor.    3. HA's  -She has been having HA when in recent hospital (03/2019) and since discharge, stable.  -Will monitor.    PLAN: -Labs reviewed, her Thrombocytopenia resolved. plt 405K today, will hold Nplate today (last dose was one week ago) -Lab, NPlate  Every 2 weeks X4, see instructions in the order  -F/u in 2 months  -I encouraged her to call us if she has bleedings, during office hours, instead of going to ED    No problem-specific Assessment & Plan notes found for this encounter.   No orders of the defined types were placed in this encounter.  All questions were answered. The patient knows to call the clinic with any problems, questions or concerns. No barriers to learning was detected. The total time spent in the appointment was 15 minutes.     Truitt Merle, MD 04/16/2019   I, Joslyn Devon, am acting as scribe for Truitt Merle, MD.   I have reviewed the above documentation for accuracy and completeness, and I agree  with the above.

## 2019-04-10 NOTE — Discharge Summary (Addendum)
Discharge Summary  Patient ID: [name] MRN: [id number] DOB/AGE: 09/04/1975 44 y.o.  Admit date: 04/08/2019 Discharge date: 04/10/2019  Discharge Diagnoses:  Principal Problem:   Acute ITP [facility])   Discharged Condition: good  Discharge Labs:   CBC    Component Value Date/Time   WBC 21.0 (H) 04/10/2019 0705   RBC 4.12 04/10/2019 0705   HGB 11.7 (L) 04/10/2019 0705   HGB 11.6 (L) 03/19/2019 0745   HCT 36.1 04/10/2019 0705   HCT 27.3 (L) 09/21/2015 0900   PLT 85 (L) 04/10/2019 0705   PLT 266 03/19/2019 0745   MCV 87.6 04/10/2019 0705   MCH 28.4 04/10/2019 0705   MCHC 32.4 04/10/2019 0705   RDW 15.0 04/10/2019 0705   LYMPHSABS 3.5 04/10/2019 0705   MONOABS 1.5 (H) 04/10/2019 0705   EOSABS 0.0 04/10/2019 0705   BASOSABS 0.1 04/10/2019 0705   CMP Latest Ref Rng & Units 04/10/2019 04/09/2019 04/08/2019  Glucose 70 - 99 mg/dL 143(H) 211(H) 120(H)  BUN 6 - 20 mg/dL 14 14 12   Creatinine 0.44 - 1.00 mg/dL 0.66 0.65 0.60  Sodium 135 - 145 mmol/L 136 135 138  Potassium 3.5 - 5.1 mmol/L 3.8 3.8 3.7  Chloride 98 - 111 mmol/L 106 104 103  CO2 22 - 32 mmol/L 24 24 26   Calcium 8.9 - 10.3 mg/dL 8.9 9.2 9.0  Total Protein 6.5 - 8.1 g/dL - 8.3(H) 6.9  Total Bilirubin 0.3 - 1.2 mg/dL - 0.2(L) 0.4  Alkaline Phos 38 - 126 U/L - 65 62  AST 15 - 41 U/L - 17 19  ALT 0 - 44 U/L - 21 21   Consults: None  Procedures: None  Disposition:  Discharge disposition: 01-Home or Self Care      Allergies as of 04/10/2019   No Known Allergies     Medication List    STOP taking these medications   eltrombopag 50 MG tablet Commonly known as: PROMACTA     TAKE these medications   acetaminophen 500 MG tablet Commonly known as: TYLENOL Take 1-2 tablets (500-1,000 mg total) by mouth every 6 (six) hours as needed for mild pain or moderate pain. What changed:   how much to take  when to take this   multivitamin capsule Take 1 capsule by mouth daily.       HPI: Ms.  Erskine Holland is a 44 year old female who presented to the emergency room with complaints of bleeding gums and petechial rash for 24 hours prior to presentation.  She had no other complaints such as headache, abdominal pain, fever, chills, nausea, vomiting, diarrhea.  CBC upon presentation showed a platelet count of less than 5000, hemoglobin 13, and platelets of count 10.9.  She was admitted for further evaluation and treatment.  Hospital Course:  The patient received 2 doses of IV dexamethasone 10 mg on 04/08/2019 and was also given a dose of IVIG 1 g/kg on 04/08/2019.  She received a dose of prednisone 60 mg x 1 on 04/09/2019.  Her second dose of IVIG due on 04/09/2019 was held secondary to arthralgias.  Was given a dose of Nplate 2 mcg/kg on 1/75/1025.  On the morning of 04/09/2018, her platelet count was up to 85,000.  She denied having any bleeding gums, epistaxis or any other bleeding elsewhere.  She reported feeling well overall and was deemed stable for discharge.  The patient will discharge home today.  She has a scheduled follow-up appointment at the cancer center on 04/16/2019 for labs, visit, and  possible Nplate injection.  She was instructed to call our office if she has any questions or notices any bleeding or petechiae.  Discharge Instructions    Diet general   Complete by: As directed    Increase activity slowly   Complete by: As directed      Signed: Clenton Pare   I have seen the patient, examined her. I agree with the assessment and and plan and have edited the notes.   Malachy Mood  04/10/2019

## 2019-04-16 ENCOUNTER — Inpatient Hospital Stay: Payer: Self-pay | Attending: Nurse Practitioner | Admitting: Hematology

## 2019-04-16 ENCOUNTER — Inpatient Hospital Stay: Payer: Self-pay

## 2019-04-16 ENCOUNTER — Other Ambulatory Visit: Payer: Self-pay

## 2019-04-16 ENCOUNTER — Encounter: Payer: Self-pay | Admitting: Hematology

## 2019-04-16 VITALS — BP 132/78 | HR 121 | Temp 97.4°F | Resp 18 | Ht 59.0 in | Wt 242.9 lb

## 2019-04-16 DIAGNOSIS — N920 Excessive and frequent menstruation with regular cycle: Secondary | ICD-10-CM | POA: Insufficient documentation

## 2019-04-16 DIAGNOSIS — Z79899 Other long term (current) drug therapy: Secondary | ICD-10-CM | POA: Insufficient documentation

## 2019-04-16 DIAGNOSIS — Z9081 Acquired absence of spleen: Secondary | ICD-10-CM | POA: Insufficient documentation

## 2019-04-16 DIAGNOSIS — R519 Headache, unspecified: Secondary | ICD-10-CM | POA: Insufficient documentation

## 2019-04-16 DIAGNOSIS — D693 Immune thrombocytopenic purpura: Secondary | ICD-10-CM | POA: Insufficient documentation

## 2019-04-16 LAB — CBC WITH DIFFERENTIAL (CANCER CENTER ONLY)
Abs Immature Granulocytes: 0.21 10*3/uL — ABNORMAL HIGH (ref 0.00–0.07)
Basophils Absolute: 0.1 10*3/uL (ref 0.0–0.1)
Basophils Relative: 0 %
Eosinophils Absolute: 0.1 10*3/uL (ref 0.0–0.5)
Eosinophils Relative: 1 %
HCT: 40.4 % (ref 36.0–46.0)
Hemoglobin: 12.9 g/dL (ref 12.0–15.0)
Immature Granulocytes: 2 %
Lymphocytes Relative: 14 %
Lymphs Abs: 1.7 10*3/uL (ref 0.7–4.0)
MCH: 28.2 pg (ref 26.0–34.0)
MCHC: 31.9 g/dL (ref 30.0–36.0)
MCV: 88.2 fL (ref 80.0–100.0)
Monocytes Absolute: 1.3 10*3/uL — ABNORMAL HIGH (ref 0.1–1.0)
Monocytes Relative: 11 %
Neutro Abs: 9.1 10*3/uL — ABNORMAL HIGH (ref 1.7–7.7)
Neutrophils Relative %: 72 %
Platelet Count: 406 10*3/uL — ABNORMAL HIGH (ref 150–400)
RBC: 4.58 MIL/uL (ref 3.87–5.11)
RDW: 15.6 % — ABNORMAL HIGH (ref 11.5–15.5)
WBC Count: 12.4 10*3/uL — ABNORMAL HIGH (ref 4.0–10.5)
nRBC: 0.7 % — ABNORMAL HIGH (ref 0.0–0.2)

## 2019-04-17 ENCOUNTER — Telehealth: Payer: Self-pay | Admitting: Hematology

## 2019-04-17 NOTE — Telephone Encounter (Signed)
Scheduled appt per 12/7 los. ° °Left a VM of the appt date and time. °

## 2019-04-18 ENCOUNTER — Inpatient Hospital Stay: Payer: Self-pay

## 2019-04-20 ENCOUNTER — Encounter (HOSPITAL_COMMUNITY): Payer: Self-pay | Admitting: Emergency Medicine

## 2019-04-20 ENCOUNTER — Emergency Department (HOSPITAL_COMMUNITY): Payer: Self-pay

## 2019-04-20 ENCOUNTER — Emergency Department (HOSPITAL_COMMUNITY)
Admission: EM | Admit: 2019-04-20 | Discharge: 2019-04-20 | Disposition: A | Discharge status: Home / Self Care | Payer: Self-pay | Attending: Emergency Medicine | Admitting: Emergency Medicine

## 2019-04-20 ENCOUNTER — Other Ambulatory Visit: Payer: Self-pay

## 2019-04-20 DIAGNOSIS — R438 Other disturbances of smell and taste: Secondary | ICD-10-CM | POA: Insufficient documentation

## 2019-04-20 DIAGNOSIS — Z20822 Contact with and (suspected) exposure to covid-19: Secondary | ICD-10-CM | POA: Insufficient documentation

## 2019-04-20 DIAGNOSIS — R0602 Shortness of breath: Secondary | ICD-10-CM | POA: Insufficient documentation

## 2019-04-20 DIAGNOSIS — R509 Fever, unspecified: Secondary | ICD-10-CM | POA: Insufficient documentation

## 2019-04-20 DIAGNOSIS — R519 Headache, unspecified: Secondary | ICD-10-CM | POA: Insufficient documentation

## 2019-04-20 LAB — CBC WITH DIFFERENTIAL/PLATELET
Abs Immature Granulocytes: 0.12 10*3/uL — ABNORMAL HIGH (ref 0.00–0.07)
Basophils Absolute: 0 10*3/uL (ref 0.0–0.1)
Basophils Relative: 0 %
Eosinophils Absolute: 0 10*3/uL (ref 0.0–0.5)
Eosinophils Relative: 0 %
HCT: 41.1 % (ref 36.0–46.0)
Hemoglobin: 13.3 g/dL (ref 12.0–15.0)
Immature Granulocytes: 1 %
Lymphocytes Relative: 17 %
Lymphs Abs: 1.8 10*3/uL (ref 0.7–4.0)
MCH: 28.5 pg (ref 26.0–34.0)
MCHC: 32.4 g/dL (ref 30.0–36.0)
MCV: 88 fL (ref 80.0–100.0)
Monocytes Absolute: 1 10*3/uL (ref 0.1–1.0)
Monocytes Relative: 10 %
Neutro Abs: 7.5 10*3/uL (ref 1.7–7.7)
Neutrophils Relative %: 72 %
Platelets: 391 10*3/uL (ref 150–400)
RBC: 4.67 MIL/uL (ref 3.87–5.11)
RDW: 15.2 % (ref 11.5–15.5)
WBC: 10.4 10*3/uL (ref 4.0–10.5)
nRBC: 0.5 % — ABNORMAL HIGH (ref 0.0–0.2)

## 2019-04-20 LAB — POC SARS CORONAVIRUS 2 AG -  ED: SARS Coronavirus 2 Ag: NEGATIVE

## 2019-04-20 LAB — BASIC METABOLIC PANEL
Anion gap: 13 (ref 5–15)
BUN: 7 mg/dL (ref 6–20)
CO2: 23 mmol/L (ref 22–32)
Calcium: 9 mg/dL (ref 8.9–10.3)
Chloride: 100 mmol/L (ref 98–111)
Creatinine, Ser: 0.7 mg/dL (ref 0.44–1.00)
GFR calc Af Amer: >60 mL/min (ref >60)
GFR calc non Af Amer: >60 mL/min (ref >60)
Glucose, Bld: 131 mg/dL — ABNORMAL HIGH (ref 70–99)
Potassium: 3.5 mmol/L (ref 3.5–5.1)
Sodium: 136 mmol/L (ref 135–145)

## 2019-04-20 MED ORDER — SODIUM CHLORIDE 0.9 % IV BOLUS
1000.0000 mL | Freq: Once | INTRAVENOUS | Status: AC
  Administered 2019-04-20: 20:00:00 1000 mL via INTRAVENOUS

## 2019-04-20 MED ORDER — ONDANSETRON 4 MG PO TBDP: 4.0000 mg | ORAL_TABLET | Freq: Three times a day (TID) | ORAL | 0 refills | Status: DC | PRN

## 2019-04-20 MED ORDER — ACETAMINOPHEN 500 MG PO TABS
1000.0000 mg | ORAL_TABLET | Freq: Once | ORAL | Status: AC
  Administered 2019-04-20: 20:00:00 1000 mg via ORAL
  Filled 2019-04-20: qty 2

## 2019-04-20 MED ORDER — BENZONATATE 100 MG PO CAPS: 100.0000 mg | ORAL_CAPSULE | Freq: Three times a day (TID) | ORAL | 0 refills | Status: DC

## 2019-04-20 MED ORDER — HYDROCOD POLST-CPM POLST ER 10-8 MG/5ML PO SUER
5.0000 mL | Freq: Once | ORAL | Status: AC
  Administered 2019-04-20: 5 mL via ORAL
  Filled 2019-04-20: qty 5

## 2019-04-20 MED ORDER — KETOROLAC TROMETHAMINE 30 MG/ML IJ SOLN
15.0000 mg | Freq: Once | INTRAMUSCULAR | Status: AC
  Administered 2019-04-20: 20:00:00 15 mg via INTRAVENOUS
  Filled 2019-04-20: qty 1

## 2019-04-20 NOTE — Discharge Instructions (Addendum)
Return for worsening sob, or if you cannot eat or drink.

## 2019-04-20 NOTE — ED Triage Notes (Signed)
Patient c/o headache, productive cough, shortness of breath, sour taste in mouth and loss of smell x 1 week but worsening since Friday. Patient states negative covid test last Tuesday. States tylenol is no longer helping symptoms.

## 2019-04-20 NOTE — ED Notes (Signed)
Pt discharged from ED; instructions provided and scripts given; Pt encouraged to return to ED if symptoms worsen and to f/u with PCP; Pt verbalized understanding of all instructions 

## 2019-04-20 NOTE — ED Provider Notes (Signed)
[facility] EMERGENCY DEPARTMENT Provider Note   CSN: [id number] Arrival date & time: 04/20/19  1815     History No chief complaint on file.   [name] is a 44 y.o. female.  44 yo F with a cc of cough shortness of breath headache and fever.  Is been going on for about a week.  She saw her oncologist about 4 days ago but did not bring it up because she had taken some Tylenol and felt better at the time of the visit.  No nausea vomiting or diarrhea.  Has lost her sense of taste and smell.  Had a coronavirus test done recently that was negative.  No known sick contacts.  She is mildly short of breath when she stands up and when she tries to walk.  The history is provided by the patient.  Illness Severity:  Moderate Onset quality:  Gradual Duration:  1 week Timing:  Constant Progression:  Worsening Chronicity:  New Associated symptoms: cough, fever, headaches and shortness of breath   Associated symptoms: no chest pain, no congestion, no myalgias, no nausea, no rhinorrhea, no vomiting and no wheezing        Past Medical History:  Diagnosis Date  . Anemia   . Chronic ITP (idiopathic thrombocytopenia) (HCC)   . Depression   . Headache   . 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 Thrombocytopenia ([place]) 03/02/2019  . Borderline diabetes 10/16/2018  . Idiopathic thrombocytopenic purpura (ITP) (HCC) 10/14/2018  . Obesity, Class III, BMI 40-49.9 (morbid obesity) ([place]) 10/14/2018  . Hyperglycemia 10/14/2018  . 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 Chronic ITP (idiopathic thrombocytopenia) (HCC)   . 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 Menorrhagia with regular cycle   . Pressure ulcer 09/19/2015  . Gingival bleeding   . Chronic hepatitis B ([place]) 09/04/2015  . Subarachnoidal hemorrhage ([place]) 09/04/2015  . Severe  thrombocytopenia ([place]) 09/04/2015  . Acute ITP ([place]) 08/28/2015    Past Surgical History:  Procedure Laterality Date  . BREAST SURGERY  biopsy  . 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 Diabetes Mother   . Breast cancer Mother     Social History   Tobacco Use  . Smoking status: Never Smoker  . Smokeless tobacco: Never Used  Substance Use Topics  . Alcohol use: No    Alcohol/week: 0.0 standard drinks  . Drug use: No    Home Medications Prior to Admission medications   Medication Sig Start Date End Date Taking? Authorizing Provider  acetaminophen (TYLENOL) 500 MG tablet Take 1-2 tablets (500-1,000 mg total) by mouth every 6 (six) hours as needed for mild pain or moderate pain. Patient taking differently: Take 500 mg by mouth daily as needed for mild pain or moderate pain.  09/22/15   Burns, Tinnie Gens, MD  benzonatate (TESSALON) 100 MG capsule Take 1 capsule (100 mg total) by mouth every 8 (eight) hours. 04/20/19   Melene Plan, DO  ondansetron (ZOFRAN ODT) 4 MG disintegrating tablet Take 1 tablet (4 mg total) by mouth every 8 (eight) hours as needed for nausea or vomiting. 04/20/19   Melene Plan, DO    Allergies    Patient has no known allergies.  Review of Systems   Review of Systems  Constitutional: Positive for chills and fever.  HENT: Negative for congestion and rhinorrhea.   Eyes: Negative for redness and visual disturbance.  Respiratory: Positive for cough and shortness of breath. Negative for wheezing.   Cardiovascular: Negative for chest pain and palpitations.  Gastrointestinal: Negative for nausea and vomiting.  Genitourinary: Negative  for dysuria and urgency.  Musculoskeletal: Negative for arthralgias and myalgias.  Skin: Negative for pallor and wound.  Neurological: Positive for headaches. Negative for dizziness.    Physical Exam Updated Vital Signs BP 119/78   Pulse (!) 110   Temp (!) 101.6 F (38.7 C) (Oral)   Resp (!) 23   SpO2 97%   Physical Exam Vitals and nursing note reviewed.  Constitutional:      General: She is not in acute distress.    Appearance: She is well-developed. She is not diaphoretic.  HENT:     Head: Normocephalic and atraumatic.  Eyes:     Pupils: Pupils are equal, round, and reactive to light.  Cardiovascular:     Rate and Rhythm: Normal rate and regular rhythm.     Heart sounds: No murmur. No friction rub. No gallop.   Pulmonary:     Effort: Pulmonary effort is normal.     Breath sounds: No wheezing or rales.  Abdominal:     General: There is no distension.     Palpations: Abdomen is soft.     Tenderness: There is no abdominal tenderness. There is no guarding.  Musculoskeletal:        General: No tenderness.     Cervical back: Normal range of motion and neck supple.  Skin:    General: Skin is warm and dry.  Neurological:     Mental Status: She is alert and oriented to person, place, and time.     Comments: Ambulates without difficulty.  No unilateral weakness.  No facial asymmetry.  Extraocular motion intact.  No neck stiffness  Psychiatric:        Behavior: Behavior normal.     ED Results / Procedures / Treatments   Labs (all labs ordered are listed, but only abnormal results are displayed) Labs Reviewed  CBC WITH DIFFERENTIAL/PLATELET - Abnormal; Notable for the following components:      Result Value   nRBC 0.5 (*)    Abs Immature Granulocytes 0.12 (*)    All other components within normal limits  BASIC METABOLIC PANEL - Abnormal; Notable for the following components:   Glucose, Bld 131 (*)    All other components within normal limits  NOVEL CORONAVIRUS, NAA  (HOSP ORDER, SEND-OUT TO REF LAB; TAT 18-24 HRS)  POC SARS CORONAVIRUS 2 AG -  ED    EKG EKG Interpretation  Date/Time:  Sunday April 20 2019 18:18:26 EST Ventricular Rate:  110 PR Interval:  150 QRS Duration: 78 QT Interval:  330 QTC Calculation: 446 R Axis:   40 Text Interpretation: Sinus tachycardia Junctional ST depression, probably normal Borderline ECG Since last tracing rate faster Otherwise no  significant change Confirmed by Melene Plan [phone]) on 04/20/2019 6:25:09 PM   Radiology DG Chest Port 1 View  Result Date: 04/20/2019 CLINICAL DATA:  Cough and fever. EXAM: PORTABLE CHEST 1 VIEW COMPARISON:  November 16, 2018 FINDINGS: Very mild atelectasis and/or infiltrate is seen within the bilateral lung bases, right greater than left. There is no evidence of a pleural effusion or pneumothorax. The cardiac silhouette is mildly enlarged. The visualized skeletal structures are unremarkable. IMPRESSION: 1. Very mild bibasilar atelectasis and/or infiltrate, right greater than left. Electronically Signed   By: Aram Candela M.D.   On: 04/20/2019 19:14    Procedures Procedures (including critical care time)  Medications Ordered in ED Medications  sodium chloride 0.9 % bolus 1,000 mL (1,000 mLs Intravenous New Bag/Given 04/20/19 1948)  acetaminophen (TYLENOL) tablet 1,000 mg (1,000 mg Oral Given 04/20/19 1937)  ketorolac (TORADOL) 30 MG/ML injection 15 mg (15 mg Intravenous Given 04/20/19 1950)  chlorpheniramine-HYDROcodone (TUSSIONEX) 10-8 MG/5ML suspension 5 mL (5 mLs Oral Given 04/20/19 1938)    ED Course  I have reviewed the triage vital signs and the nursing notes.  Pertinent labs & imaging results that were available during my care of the patient were reviewed by me and considered in my medical decision making (see chart for details).    MDM Rules/Calculators/A&P                      44 yo female with a chief complaints of cough fever loss of sense of taste and smell and  headache.  Going on for about a week.  Is occurring during the novel coronavirus pandemic and the history sounds concerning for the same.  She otherwise appears well.  She has no meningeal signs no neurologic deficits.  We will treat her symptomatically portable chest x-ray to evaluate for bacterial pneumonia.  Lab work is she has ITP and was recently in the hospital and had low platelets though had a recent check about 4 days ago that was normal.  Patient labwork with normal platelets.  Ambulating without issue.  Feeling much better.  Rapid covid negative, start on cough and nausea meds.  PCP follow up.   8:44 PM:  I have discussed the diagnosis/risks/treatment options with the patient and believe the pt to be eligible for discharge home to follow-up with PCP. We also discussed returning to the ED immediately if new or worsening sx occur. We discussed the sx which are most concerning (e.g., sudden worsening pain, fever, inability to tolerate by mouth) that necessitate immediate return. Medications administered to the patient during their visit and any new prescriptions provided to the patient are listed below.  Jazari Ober was evaluated in Emergency Department on 04/20/2019 for the symptoms described in the history of present illness. He/she was evaluated in the context of the global COVID-19 pandemic, which necessitated consideration that the patient might be at risk for infection with the SARS-CoV-2 virus that causes COVID-19. Institutional protocols and algorithms that pertain to the evaluation of patients at risk for COVID-19 are in a state of rapid change based on information released by regulatory bodies including the CDC and federal and state organizations. These policies and algorithms were followed during the patient's care in the ED.   Medications given during this visit Medications  sodium chloride 0.9 % bolus 1,000 mL (1,000 mLs Intravenous New Bag/Given 04/20/19 1948)   acetaminophen (TYLENOL) tablet 1,000 mg (1,000 mg Oral Given 04/20/19 1937)  ketorolac (TORADOL) 30 MG/ML injection  15 mg (15 mg Intravenous Given 04/20/19 1950)  chlorpheniramine-HYDROcodone (TUSSIONEX) 10-8 MG/5ML suspension 5 mL (5 mLs Oral Given 04/20/19 1938)     The patient appears reasonably screen and/or stabilized for discharge and I doubt any other medical condition or other [facility] requiring further screening, evaluation, or treatment in the ED at this time prior to discharge.   Final Clinical Impression(s) / ED Diagnoses Final diagnoses:  Suspected COVID-19 virus infection    Rx / DC Orders ED Discharge Orders         Ordered    ondansetron (ZOFRAN ODT) 4 MG disintegrating tablet  Every 8 hours PRN     04/20/19 2033    benzonatate (TESSALON) 100 MG capsule  Every 8 hours     04/20/19 2033           Melene Plan, DO 04/20/19 2044

## 2019-04-23 ENCOUNTER — Telehealth: Payer: Self-pay

## 2019-04-23 NOTE — Telephone Encounter (Signed)
Raynelle Fanning sowell spanish interpretor states that Ms [name] called and stated she is covid positive and is having increased shortness of breath.  Raynelle Fanning called Ms [name] and told her to go to the [facility] ED.  That she needed medical care.  The patient verbalized understanding.

## 2019-04-24 ENCOUNTER — Telehealth: Payer: Self-pay

## 2019-04-24 ENCOUNTER — Ambulatory Visit: Payer: Self-pay | Admitting: Gastroenterology

## 2019-04-24 LAB — NOVEL CORONAVIRUS, NAA (HOSP ORDER, SEND-OUT TO REF LAB; TAT 18-24 HRS): SARS-CoV-2, NAA: DETECTED — AB

## 2019-04-24 NOTE — Telephone Encounter (Signed)
Per Santiago Glad NP called [phone]) to check on patient. Patient stated that she is doing ok, she is covid positive but is at home and not in the hospital. Patient also stated that she is not currently having any shortness of breath. Lacie made aware

## 2019-04-24 NOTE — Telephone Encounter (Signed)
[name] or [name],  Can you please call and check on her. I don't see an ED note from yesterday, and I don't think [facility] has an ED.  Thanks, Clayborn Heron

## 2019-04-25 ENCOUNTER — Encounter (HOSPITAL_COMMUNITY): Payer: Self-pay

## 2019-04-25 ENCOUNTER — Ambulatory Visit (HOSPITAL_COMMUNITY)
Admission: RE | Admit: 2019-04-25 | Discharge: 2019-04-25 | Disposition: A | Discharge status: Home / Self Care | Payer: HRSA Program | Source: Ambulatory Visit | Attending: Pulmonary Disease | Admitting: Pulmonary Disease

## 2019-04-25 ENCOUNTER — Other Ambulatory Visit: Payer: Self-pay | Admitting: Physician Assistant

## 2019-04-25 ENCOUNTER — Ambulatory Visit (HOSPITAL_COMMUNITY): Payer: Self-pay

## 2019-04-25 DIAGNOSIS — U071 COVID-19: Secondary | ICD-10-CM | POA: Diagnosis present

## 2019-04-25 MED ORDER — SODIUM CHLORIDE 0.9 % IV SOLN
INTRAVENOUS | Status: DC | PRN
  Administered 2019-04-25: 250 mL via INTRAVENOUS

## 2019-04-25 MED ORDER — EPINEPHRINE 0.3 MG/0.3ML IJ SOAJ: 0.3000 mg | Freq: Once | INTRAMUSCULAR | Status: DC | PRN

## 2019-04-25 MED ORDER — DIPHENHYDRAMINE HCL 50 MG/ML IJ SOLN: 50.0000 mg | Freq: Once | INTRAMUSCULAR | Status: DC | PRN

## 2019-04-25 MED ORDER — ALBUTEROL SULFATE HFA 108 (90 BASE) MCG/ACT IN AERS: 2.0000 | INHALATION_SPRAY | Freq: Once | RESPIRATORY_TRACT | Status: DC | PRN

## 2019-04-25 MED ORDER — FAMOTIDINE IN NACL 20-0.9 MG/50ML-% IV SOLN: 20.0000 mg | Freq: Once | INTRAVENOUS | Status: DC | PRN

## 2019-04-25 MED ORDER — METHYLPREDNISOLONE SODIUM SUCC 125 MG IJ SOLR: 125.0000 mg | Freq: Once | INTRAMUSCULAR | Status: DC | PRN

## 2019-04-25 MED ORDER — SODIUM CHLORIDE 0.9 % IV SOLN
700.0000 mg | Freq: Once | INTRAVENOUS | Status: AC
  Administered 2019-04-25: 700 mg via INTRAVENOUS
  Filled 2019-04-25: qty 20

## 2019-04-25 NOTE — Progress Notes (Signed)
  Diagnosis: COVID-19  Physician: Wright  Procedure: [facility] Infusion [facility] Med: bamlanivimab infusion - Provided patient with bamlanimivab fact sheet for patients, parents and caregivers prior to infusion.  Complications: No immediate complications noted.  Discharge: Discharged home   Gaetana Kawahara E 04/25/2019   

## 2019-04-25 NOTE — Discharge Instructions (Signed)
COVID-19: Cmo protegerse y proteger a los dems COVID-19: How to Protect Yourself and Others Sepa cmo se propaga  Actualmente, no existe ninguna vacuna para prevenir la enfermedad por coronavirus 2019 (COVID-19).  La mejor forma de prevenir la enfermedad es evitar exponerse a este virus.  Se cree que el virus se transmite principalmente de una persona a otra. ? Entre las personas que estn en contacto directo entre s (a una distancia inferior a 6 pies [1.80m]). ? A travs de las gotitas respiratorias producidas cuando una persona infectada tose, estornuda o habla. ? Estas gotitas pueden caer en la boca o en la nariz de las personas que estn cerca o pueden ser inhaladas hacia los pulmones. ? El COVID-19 puede ser transmitido por personas que no presentan sntomas. Lo que todos deben hacer Lmpiese las manos con frecuencia  Lvese las manos con frecuencia con agua y jabn durante al menos 20 segundos, especialmente despus de haber estado en un lugar pblico o despus de sonarse la nariz, toser o estornudar.  Si no dispone de agua y jabn, use un desinfectante de manos que contenga al menos un 60% de alcohol. Cubra todas las superficies de las manos y frtelas hasta que se sientan secas.  No se toque los ojos, la nariz y la boca sin antes lavarse las manos. Evite el contacto cercano  Limite el contacto directo con otras personas tanto como sea posible.  Evite el contacto cercano con personas que estn enfermas.  Establezca distancia entre usted y otras personas. ? Recuerde que algunas personas que no tienen sntomas pueden transmitir el virus. ? Esto es especialmente importante para las personas que tienen ms riesgo de enfermarse.www.cdc.gov/coronavirus/2019-ncov/need-extra-precautions/people-at-higher-risk.html Cbrase la boca y la nariz con una mascarilla cuando est cerca de otras personas  Puede transmitir el COVID-19 a otras personas aunque no se sienta enfermo.  Todas las  personas deben usar mascarilla en lugares pblicos y cuando estn con otras que no vivan en su casa, especialmente cuando el distanciamiento social sea difcil de mantener. ? Las mascarillas no deben colocarse a nios menores de 2 aos de edad, a las personas que tienen problemas respiratorios o que estn inconscientes, incapacitadas o que por algn motivo no puedan quitarse la mascarilla sin ayuda.  El propsito de la mascarilla es proteger a otras personas en caso de que usted est infectado.  NO utilice las mascarillas destinadas a los trabajadores de la salud.  Contine manteniendo una distancia aproximada de 6 pies (1.80m) entre usted y otras personas. La mascarilla no reemplaza el distanciamiento social. Cbrase al toser y estornudar  Al toser o al estornudar, siempre cbrase la boca y la nariz con un pauelo descartable o use el interior del codo.  Deseche los pauelos descartables usados en la basura.  Inmediatamente, lvese las manos con agua y jabn durante al menos 20segundos. Si no dispone de agua y jabn, lmpiese las manos con un desinfectante de manos que contenga al menos un 60% de alcohol. Limpie y desinfecte  Limpie Y desinfecte las superficies que se tocan con frecuencia todos los das. Esto incluye mesas, picaportes, interruptores de luz, encimeras, mangos, escritorios, telfonos, teclados, inodoros, grifos y lavabos. www.cdc.gov/coronavirus/2019-ncov/prevent-getting-sick/disinfecting-your-home.html  Si las superficies estn sucias, lmpielas: Use detergente o jabn y agua antes de la desinfeccin.  Luego, use un desinfectante domstico. Puede consultar una lista de los desinfectantes domsticos registrados en la Environmental Protection Agency (EPA) (Agencia de Proteccin Ambiental) aqu. cdc.gov/coronavirus 11/20/2018 Esta informacin no tiene como fin reemplazar el consejo del mdico.   Asegrese de hacerle al mdico cualquier pregunta que tenga. Document Revised:  12/04/2018 Document Reviewed: 09/27/2018 Elsevier Patient Education  2020 Elsevier Inc. What types of side effects do monoclonal antibody drugs cause?  Common side effects  In general, the more common side effects caused by monoclonal antibody drugs include: . Allergic reactions, such as hives or itching . Flu-like signs and symptoms, including chills, fatigue, fever, and muscle aches and pains . Nausea, vomiting . Diarrhea . Skin rashes . Low blood pressure   The CDC is recommending patients who receive monoclonal antibody treatments wait at least 90 days before being vaccinated.  Currently, there are no data on the safety and efficacy of mRNA COVID-19 vaccines in persons who received monoclonal antibodies or convalescent plasma as part of COVID-19 treatment. Based on the estimated half-life of such therapies as well as evidence suggesting that reinfection is uncommon in the 90 days after initial infection, vaccination should be deferred for at least 90 days, as a precautionary measure until additional information becomes available, to avoid interference of the antibody treatment with vaccine-induced immune responses. 

## 2019-04-25 NOTE — Progress Notes (Signed)
  I connected by phone with [name] on 04/25/2019 at 9:27 AM to discuss the potential use of an new treatment for mild to moderate COVID-19 viral infection in non-hospitalized patients.  This patient is a 44 y.o. female that meets the FDA criteria for Emergency Use Authorization of bamlanivimab or casirivimab\imdevimab.  Has a (+) direct SARS-CoV-2 viral test result  Has mild or moderate COVID-19   Is ? 44 years of age and weighs ? 40 kg  Is NOT hospitalized due to COVID-19  Is NOT requiring oxygen therapy or requiring an increase in baseline oxygen flow rate due to COVID-19  Is within 10 days of symptom onset  Has at least one of the high risk factor(s) for progression to severe COVID-19 and/or hospitalization as defined in EUA.  Specific high risk criteria : BMI >/= 35   I have spoken and communicated the following to the patient or parent/caregiver:  1. FDA has authorized the emergency use of bamlanivimab and casirivimab\imdevimab for the treatment of mild to moderate COVID-19 in adults and pediatric patients with positive results of direct SARS-CoV-2 viral testing who are 10 years of age and older weighing at least 40 kg, and who are at high risk for progressing to severe COVID-19 and/or hospitalization.  2. The significant known and potential risks and benefits of bamlanivimab and casirivimab\imdevimab, and the extent to which such potential risks and benefits are unknown.  3. Information on available alternative treatments and the risks and benefits of those alternatives, including clinical trials.  4. Patients treated with bamlanivimab and casirivimab\imdevimab should continue to self-isolate and use infection control measures (e.g., wear mask, isolate, social distance, avoid sharing personal items, clean and disinfect "high touch" surfaces, and frequent handwashing) according to CDC guidelines.   5. The patient or parent/caregiver has the option to accept or  refuse bamlanivimab or casirivimab\imdevimab .  After reviewing this information with the patient, The patient agreed to proceed with receiving the bamlanimivab infusion and will be provided a copy of the Fact sheet prior to receiving the infusion.    Set up for today at 4:30pm. Needs a spanish interpreter. Sx onset 04/21/19.  Cline Crock PA-C 04/25/2019 9:27 AM

## 2019-04-30 ENCOUNTER — Inpatient Hospital Stay: Payer: Self-pay

## 2019-05-07 ENCOUNTER — Other Ambulatory Visit: Payer: Self-pay

## 2019-05-07 ENCOUNTER — Inpatient Hospital Stay: Payer: Self-pay | Attending: Nurse Practitioner

## 2019-05-07 ENCOUNTER — Inpatient Hospital Stay: Payer: Self-pay

## 2019-05-07 VITALS — BP 92/44 | HR 71 | Temp 97.8°F | Resp 20

## 2019-05-07 DIAGNOSIS — D693 Immune thrombocytopenic purpura: Secondary | ICD-10-CM

## 2019-05-07 LAB — CBC WITH DIFFERENTIAL (CANCER CENTER ONLY)
Abs Immature Granulocytes: 0.06 10*3/uL (ref 0.00–0.07)
Basophils Absolute: 0.1 10*3/uL (ref 0.0–0.1)
Basophils Relative: 1 %
Eosinophils Absolute: 0.1 10*3/uL (ref 0.0–0.5)
Eosinophils Relative: 1 %
HCT: 35.5 % — ABNORMAL LOW (ref 36.0–46.0)
Hemoglobin: 11.4 g/dL — ABNORMAL LOW (ref 12.0–15.0)
Immature Granulocytes: 1 %
Lymphocytes Relative: 27 %
Lymphs Abs: 2.7 10*3/uL (ref 0.7–4.0)
MCH: 28.6 pg (ref 26.0–34.0)
MCHC: 32.1 g/dL (ref 30.0–36.0)
MCV: 89 fL (ref 80.0–100.0)
Monocytes Absolute: 1 10*3/uL (ref 0.1–1.0)
Monocytes Relative: 9 %
Neutro Abs: 6.3 10*3/uL (ref 1.7–7.7)
Neutrophils Relative %: 61 %
Platelet Count: 365 10*3/uL (ref 150–400)
RBC: 3.99 MIL/uL (ref 3.87–5.11)
RDW: 15.7 % — ABNORMAL HIGH (ref 11.5–15.5)
WBC Count: 10.1 10*3/uL (ref 4.0–10.5)
nRBC: 0 % (ref 0.0–0.2)

## 2019-05-07 MED ORDER — ROMIPLOSTIM 250 MCG ~~LOC~~ SOLR
110.0000 ug | Freq: Once | SUBCUTANEOUS | Status: AC
  Administered 2019-05-07: 10:00:00 110 ug via SUBCUTANEOUS
  Filled 2019-05-07: qty 0.22

## 2019-05-07 NOTE — Patient Instructions (Signed)
Romiplostim injection What is this medicine? ROMIPLOSTIM (roe mi PLOE stim) helps your body make more platelets. This medicine is used to treat low platelets caused by chronic idiopathic thrombocytopenic purpura (ITP). This medicine may be used for other purposes; ask your health care provider or pharmacist if you have questions. COMMON BRAND NAME(S): Nplate What should I tell my health care provider before I take this medicine? They need to know if you have any of these conditions:  bleeding disorders  bone marrow problem, like blood cancer or myelodysplastic syndrome  history of blood clots  liver disease  surgery to remove your spleen  an unusual or allergic reaction to romiplostim, mannitol, other medicines, foods, dyes, or preservatives  pregnant or trying to get pregnant  breast-feeding How should I use this medicine? This medicine is for injection under the skin. It is given by a health care professional in a hospital or clinic setting. A special MedGuide will be given to you before your injection. Read this information carefully each time. Talk to your pediatrician regarding the use of this medicine in children. While this drug may be prescribed for children as young as 1 year for selected conditions, precautions do apply. Overdosage: If you think you have taken too much of this medicine contact a poison control center or emergency room at once. NOTE: This medicine is only for you. Do not share this medicine with others. What if I miss a dose? It is important not to miss your dose. Call your doctor or health care professional if you are unable to keep an appointment. What may interact with this medicine? Interactions are not expected. This list may not describe all possible interactions. Give your health care provider a list of all the medicines, herbs, non-prescription drugs, or dietary supplements you use. Also tell them if you smoke, drink alcohol, or use illegal drugs.  Some items may interact with your medicine. What should I watch for while using this medicine? Your condition will be monitored carefully while you are receiving this medicine. Visit your prescriber or health care professional for regular checks on your progress and for the needed blood tests. It is important to keep all appointments. What side effects may I notice from receiving this medicine? Side effects that you should report to your doctor or health care professional as soon as possible:  allergic reactions like skin rash, itching or hives, swelling of the face, lips, or tongue  signs and symptoms of bleeding such as bloody or black, tarry stools; red or dark brown urine; spitting up blood or brown material that looks like coffee grounds; red spots on the skin; unusual bruising or bleeding from the eyes, gums, or nose  signs and symptoms of a blood clot such as chest pain; shortness of breath; pain, swelling, or warmth in the leg  signs and symptoms of a stroke like changes in vision; confusion; trouble speaking or understanding; severe headaches; sudden numbness or weakness of the face, arm or leg; trouble walking; dizziness; loss of balance or coordination Side effects that usually do not require medical attention (report to your doctor or health care professional if they continue or are bothersome):  headache  pain in arms and legs  pain in mouth  stomach pain This list may not describe all possible side effects. Call your doctor for medical advice about side effects. You may report side effects to FDA at 1-800-FDA-1088. Where should I keep my medicine? This drug is given in a hospital or clinic   and will not be stored at home. NOTE: This sheet is a summary. It may not cover all possible information. If you have questions about this medicine, talk to your doctor, pharmacist, or health care provider.  2020 Elsevier/Gold Standard (2017-03-05 11:10:55)  

## 2019-05-07 NOTE — Progress Notes (Signed)
Pltc = 365 today Pt will get Nplate 1 mcg/kg today per Dr. Mosetta Putt.  Ebony Hail, Pharm.D., CPP 05/07/2019@9 :52 AM

## 2019-05-13 ENCOUNTER — Other Ambulatory Visit: Payer: Self-pay

## 2019-05-14 ENCOUNTER — Ambulatory Visit: Payer: Self-pay

## 2019-05-14 ENCOUNTER — Other Ambulatory Visit: Payer: Self-pay

## 2019-05-16 ENCOUNTER — Other Ambulatory Visit: Payer: Self-pay

## 2019-05-19 ENCOUNTER — Other Ambulatory Visit: Payer: Self-pay | Admitting: Oncology

## 2019-05-20 ENCOUNTER — Ambulatory Visit: Payer: Self-pay | Admitting: Gastroenterology

## 2019-05-28 ENCOUNTER — Inpatient Hospital Stay: Payer: Self-pay

## 2019-05-28 ENCOUNTER — Other Ambulatory Visit: Payer: Self-pay

## 2019-05-28 ENCOUNTER — Inpatient Hospital Stay: Payer: Self-pay | Attending: Nurse Practitioner

## 2019-05-28 VITALS — BP 100/49 | HR 70 | Temp 98.1°F | Resp 18

## 2019-05-28 DIAGNOSIS — D693 Immune thrombocytopenic purpura: Secondary | ICD-10-CM | POA: Insufficient documentation

## 2019-05-28 DIAGNOSIS — M549 Dorsalgia, unspecified: Secondary | ICD-10-CM | POA: Insufficient documentation

## 2019-05-28 DIAGNOSIS — Z79899 Other long term (current) drug therapy: Secondary | ICD-10-CM | POA: Insufficient documentation

## 2019-05-28 DIAGNOSIS — Z8616 Personal history of COVID-19: Secondary | ICD-10-CM | POA: Insufficient documentation

## 2019-05-28 LAB — CBC WITH DIFFERENTIAL (CANCER CENTER ONLY)
Abs Immature Granulocytes: 0.07 K/uL (ref 0.00–0.07)
Basophils Absolute: 0.1 K/uL (ref 0.0–0.1)
Basophils Relative: 1 %
Eosinophils Absolute: 0.2 K/uL (ref 0.0–0.5)
Eosinophils Relative: 2 %
HCT: 39.2 % (ref 36.0–46.0)
Hemoglobin: 12.5 g/dL (ref 12.0–15.0)
Immature Granulocytes: 1 %
Lymphocytes Relative: 32 %
Lymphs Abs: 3.7 K/uL (ref 0.7–4.0)
MCH: 28.6 pg (ref 26.0–34.0)
MCHC: 31.9 g/dL (ref 30.0–36.0)
MCV: 89.7 fL (ref 80.0–100.0)
Monocytes Absolute: 0.8 K/uL (ref 0.1–1.0)
Monocytes Relative: 7 %
Neutro Abs: 6.7 K/uL (ref 1.7–7.7)
Neutrophils Relative %: 57 %
Platelet Count: 322 K/uL (ref 150–400)
RBC: 4.37 MIL/uL (ref 3.87–5.11)
RDW: 15.4 % (ref 11.5–15.5)
WBC Count: 11.5 K/uL — ABNORMAL HIGH (ref 4.0–10.5)
nRBC: 0.2 % (ref 0.0–0.2)

## 2019-05-28 MED ORDER — ROMIPLOSTIM 250 MCG ~~LOC~~ SOLR
110.0000 ug | Freq: Once | SUBCUTANEOUS | Status: AC
  Administered 2019-05-28: 110 ug via SUBCUTANEOUS
  Filled 2019-05-28: qty 0.22

## 2019-05-28 NOTE — Patient Instructions (Signed)
Romiplostim injection Qu es este medicamento? El ROMIPLOSTIM ayuda a que su cuerpo produzca ms plaquetas. Este medicamento se utiliza para tratar el nivel bajo de plaquetas causado por prpura trombocitopnica inmunitaria crnica (PTI). Este medicamento puede ser utilizado para otros usos; si tiene alguna pregunta consulte con su proveedor de atencin mdica o con su farmacutico. MARCAS COMUNES: Nplate Qu le debo informar a mi profesional de la salud antes de tomar este medicamento? Necesitan saber si usted presenta alguno de los siguientes problemas o situaciones: trastornos de sangrado problema de mdula sea, tales como cncer en la sangre o sndrome mielodisplsico antecedentes de cogulos sanguneos enfermedad heptica ciruga para extirpar el bazo una reaccin alrgica o inusual al romiplostim, al manitol, a otros medicamentos, alimentos, colorantes o conservantes si est embarazada o buscando quedar embarazada si est amamantando a un beb Cmo debo utilizar este medicamento? Este medicamento se administra mediante una inyeccin por va subcutnea. Lo administra un profesional de la salud en un hospital o en un entorno clnico. Le entregarn una Gua del medicamento especial (MedGuide, su nombre en ingls) antes de la inyeccin. Lea esta informacin cada vez cuidadosamente. Hable con su pediatra para informarse acerca del uso de este medicamento en nios. Aunque este medicamento se puede recetar a nios tan pequeos como de 1 ao de edad con ciertos padecimientos, existen precauciones que deben tomarse. Sobredosis: Pngase en contacto inmediatamente con un centro toxicolgico o una sala de urgencia si usted cree que haya tomado demasiado medicamento. ATENCIN: Este medicamento es solo para usted. No comparta este medicamento con nadie. Qu sucede si me olvido de una dosis? Es importante no olvidar ninguna dosis. Informe a su mdico o a su profesional de la salud si no puede asistir a  una cita. Qu puede interactuar con este medicamento? No se esperan interacciones. Puede ser que esta lista no menciona todas las posibles interacciones. Informe a su profesional de la salud de todos los productos a base de hierbas, medicamentos de venta libre o suplementos nutritivos que est tomando. Si usted fuma, consume bebidas alcohlicas o si utiliza drogas ilegales, indqueselo tambin a su profesional de la salud. Algunas sustancias pueden interactuar con su medicamento. A qu debo estar atento al usar este medicamento? Se supervisar su estado de salud atentamente mientras reciba este medicamento. Visite a su profesional que lo receta o a su profesional de la salud para chequear su evolucin peridicamente y para realizarse a los anlisis de sangre necesarios. Es importante asistir a todas sus citas. Qu efectos secundarios puedo tener al utilizar este medicamento? Efectos secundarios que debe informar a su mdico o a su profesional de la salud tan pronto como sea posible: reacciones alrgicas, como erupcin cutnea, comezn/picazn o urticaria, e hinchazn de la cara, los labios o la lengua signos y sntomas de sangrado, tales como heces con sangre o de color negro y aspecto alquitranado; orina de color rojo o marrn oscuro; escupir sangre o material marrn que tiene el aspecto de granos de caf molido; manchas rojas en la piel; sangrado o moretones inusuales en los ojos, las encas o la nariz signos y sntomas de un cogulo sanguneo, tales como dolor en el pecho; falta de aire; dolor, hinchazn o calor en la pierna signos y sntomas de un accidente cerebrovascular, tales como cambios en la visin; confusin; dificultad para hablar o entender; dolores de cabeza severos; entumecimiento o debilidad repentina de la cara, el brazo o la pierna; problemas al caminar; mareo; prdida del equilibrio o la coordinacin Efectos secundarios   que generalmente no requieren atencin mdica (infrmelos a su  mdico o a Producer, television/film/video de la salud si persisten o si son molestos): dolor de Public house manager en los brazos y las Programme researcher, broadcasting/film/video en la boca dolor estomacal Puede ser que esta lista no menciona todos los posibles efectos secundarios. Comunquese a su mdico por asesoramiento mdico [employer]. Usted puede informar los efectos secundarios a la FDA por telfono al 1-800-FDA-1088. Dnde debo guardar mi medicina? Este medicamento se administra en hospitales o clnicas y no necesitar guardarlo en su domicilio. ATENCIN: Este folleto es un resumen. Puede ser que no cubra toda la posible informacin. Si usted tiene preguntas acerca de esta medicina, consulte con su mdico, su farmacutico o su profesional de Radiographer, therapeutic.  2020 Elsevier/Gold Standard (2017-06-07 00:00:00)

## 2019-06-04 ENCOUNTER — Encounter: Payer: Self-pay | Admitting: Pharmacy Technician

## 2019-06-04 NOTE — Progress Notes (Signed)
Patient has been approved for drug assistance by N[employer] for [employer]. The enrollment period is from 05/29/19-05/28/20 with retro period to cover 05/07/19 and 05/28/2019 based on self pay. First DOS covered is 05/07/19.

## 2019-06-05 NOTE — Progress Notes (Signed)
[place]   Telephone:([phone] Fax:(336) [phone]   Clinic Follow up Note   Patient Care Team: Truitt Merle, MD as PCP - General (Hematology) Annia Belt, MD as Consulting Physician (Oncology) Stanford Breed (Hematology and Oncology) Danice Goltz, MD as Consulting Physician (Ophthalmology) Annie Main, MD (Internal Medicine)  Date of Service:  06/11/2019  CHIEF COMPLAINT: F/u of ITP  CURRENT THERAPY:  -Promacta 25mg  daily starting 10/22/18. Stopped 04/08/18 due to recurrent flares.  -Nplate q2weeks starting in 2 weeks  INTERVAL HISTORY:  [name] is here for a follow up of ITP. She presents to the clinic with her Spanish Interpretor. She notes she tested positive for COVID19 in January. She was not very sick but has trouble breathing. She did receive treatment for this. She notes only having back pain which she still has.    REVIEW OF SYSTEMS:   Constitutional: Denies fevers, chills or abnormal weight loss Eyes: Denies blurriness of vision Ears, nose, mouth, throat, and face: Denies mucositis or sore throat Respiratory: Denies cough, dyspnea or wheezes Cardiovascular: Denies palpitation, chest discomfort or lower extremity swelling Gastrointestinal:  Denies nausea, heartburn or change in bowel habits Skin: Denies abnormal skin rashes MSK: (+) Back pain  Lymphatics: Denies new lymphadenopathy or easy bruising Neurological:Denies numbness, tingling or new weaknesses Behavioral/Psych: Mood is stable, no new changes  All other systems were reviewed with the patient and are negative.  MEDICAL HISTORY:  Past Medical History:  Diagnosis Date  . Anemia   . Chronic ITP (idiopathic thrombocytopenia) (HCC)   . Depression   . Headache   . 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 BREAST SURGERY  biopsy  .  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  ALLERGIES:  has No Known Allergies.  MEDICATIONS:  Current Outpatient Medications  Medication Sig Dispense Refill  . acetaminophen (TYLENOL) 500 MG tablet Take 1-2 tablets (500-1,000 mg total) by mouth every 6 (six) hours as needed for mild pain or moderate pain. (Patient taking differently: Take 500 mg by mouth daily as needed for mild pain or moderate pain. ) 30 tablet 0   No current facility-administered medications for this visit.    PHYSICAL EXAMINATION: ECOG PERFORMANCE STATUS: 1 - Symptomatic but completely ambulatory  Vitals:   06/11/19 0914  BP: 96/64  Pulse: 79  Resp: 17  Temp: 98.3 F (36.8 C)  SpO2: 100%   Filed Weights   06/11/19 0914  Weight: 245 lb 8 oz (111.4 kg)    Due to COVID19 we will limit examination to appearance. Patient had no complaints.  GENERAL:alert, no distress and comfortable SKIN: skin color normal, no rashes or significant lesions EYES: normal, Conjunctiva are pink and non-injected, sclera clear  NEURO: alert & oriented x 3 with fluent speech   LABORATORY DATA:  I have reviewed the data as listed CBC Latest Ref Rng & Units 06/11/2019 05/28/2019 05/07/2019  WBC 4.0 - 10.5 K/uL 12.2(H) 11.5(H) 10.1  Hemoglobin 12.0 - 15.0 g/dL 12.5 12.5 11.4(L)  Hematocrit 36.0 - 46.0 % 38.7 39.2 35.5(L)  Platelets 150 - 400 K/uL 133(L) 322 365  CMP Latest Ref Rng & Units 04/20/2019 04/10/2019 04/09/2019  Glucose 70 - 99 mg/dL 149(F) 026(V) 785(Y)  BUN 6 - 20 mg/dL 7 14 14   Creatinine 0.44 - 1.00 mg/dL 8.50 2.77  Sodium 135 - 145 mmol/L 136 136 135  Potassium 3.5 - 5.1 mmol/L 3.5 3.8 3.8  Chloride 98 - 111 mmol/L 100 106 104  CO2 22 - 32 mmol/L 23 24 24   Calcium 8.9 - 10.3 mg/dL 9.0  8.9 9.2  Total Protein 6.5 - 8.1 g/dL - - 8.3(H)  Total Bilirubin 0.3 - 1.2 mg/dL - - 4.12)  Alkaline Phos 38 - 126 U/L - - 65  AST 15 - 41 U/L - - 17  ALT 0 - 44 U/L - - 21      RADIOGRAPHIC STUDIES: I have personally reviewed the radiological images as listed and agreed with the findings in the report. No results found.   ASSESSMENT & PLAN:  [name] is a 44 y.o. female with    1. 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 -She was hospitalizedagainon 10/14/18 for acute ITP flare. She was treated with dexamethasone, andone doseNPlate.CT head was negative forintracranial hemorrhage. Platelets improved from 17K to 36K.  -Due to her recurrent ITP flare,she iscurrently onmaintenance TPOoralPromacta25mg  daily starting 10/22/18.She is currently on patient assistance through 10/2019.  -She has had 2 more ITP flares in 02/2019 (treated with dexa and NPlate and 1 dose IVIG and increased to 50mg  Promacta with discharge) and 03/2019 (treated with dexa, prednisone, Nplate and 1 dose IVIG).  -She has stopped promacta since previous hospitalization. I started her on Nplate infusions every 2 weeks on 04/09/19 to better control her ITP and prevent acute flare.  -Labs reviewed, WBC 12.2, plt 133K. Will proceed with Nplate today and increase dose to 2mg /kg today. Due to her history of rapid dropping plt in the past, will give an extra injection next week then return back to every 2 weeks if her plt normal next week. She is agreeable.  -F/u in 3 months    2.History of small foci ofsubarachnoid hemorrhage -Secondary to #1 -recovered well overall.Will continue  to monitor.  3. HA's  -She has been having HA when in recent hospital (03/2019) and since discharge, stable.  -Will monitor.   4. COVID19 (+) in early 04/2019 -She notes she had mild to moderate breathing issues. No fever. She was treated monoclonial antibodies.  -She has overall recovered well.  -She is fine to proceed with COVID19 vaccine 3 months after her tested positive.   PLAN: -Labs reviewed, plt 133K today. Will proceed with Nplate today and give high dose 2mg /kg today, I spoke with pharmacy  -Nplate next week, reduce dose to 1mg /kg if plt normal,  then continue same dose every 2 weeks X6 -Lab and F/u in 3 months    No problem-specific Assessment & Plan notes found for this encounter.   No orders of the defined types were placed in this encounter.  All questions were answered. The patient knows to call the clinic with any problems, questions or concerns. No barriers to learning was detected. The total time spent in the appointment was 20 minutes.     04/11/19, MD 06/11/2019   I, 12-31-1987, am acting as  scribe for Mitra Duling, MD.   I have reviewed the above documentation for accuracy and completeness, and I agree with the above.       

## 2019-06-11 ENCOUNTER — Inpatient Hospital Stay: Payer: Self-pay

## 2019-06-11 ENCOUNTER — Encounter: Payer: Self-pay | Admitting: Hematology

## 2019-06-11 ENCOUNTER — Inpatient Hospital Stay (HOSPITAL_BASED_OUTPATIENT_CLINIC_OR_DEPARTMENT_OTHER): Payer: Self-pay | Admitting: Hematology

## 2019-06-11 ENCOUNTER — Telehealth: Payer: Self-pay | Admitting: Hematology

## 2019-06-11 ENCOUNTER — Other Ambulatory Visit: Payer: Self-pay

## 2019-06-11 VITALS — BP 96/64 | HR 79 | Temp 98.3°F | Resp 17 | Ht 59.0 in | Wt 245.5 lb

## 2019-06-11 DIAGNOSIS — D693 Immune thrombocytopenic purpura: Secondary | ICD-10-CM

## 2019-06-11 LAB — CBC WITH DIFFERENTIAL (CANCER CENTER ONLY)
Abs Immature Granulocytes: 0.06 10*3/uL (ref 0.00–0.07)
Basophils Absolute: 0.1 10*3/uL (ref 0.0–0.1)
Basophils Relative: 1 %
Eosinophils Absolute: 0.2 10*3/uL (ref 0.0–0.5)
Eosinophils Relative: 2 %
HCT: 38.7 % (ref 36.0–46.0)
Hemoglobin: 12.5 g/dL (ref 12.0–15.0)
Immature Granulocytes: 1 %
Lymphocytes Relative: 29 %
Lymphs Abs: 3.6 10*3/uL (ref 0.7–4.0)
MCH: 28.9 pg (ref 26.0–34.0)
MCHC: 32.3 g/dL (ref 30.0–36.0)
MCV: 89.4 fL (ref 80.0–100.0)
Monocytes Absolute: 1 10*3/uL (ref 0.1–1.0)
Monocytes Relative: 8 %
Neutro Abs: 7.2 10*3/uL (ref 1.7–7.7)
Neutrophils Relative %: 59 %
Platelet Count: 133 10*3/uL — ABNORMAL LOW (ref 150–400)
RBC: 4.33 MIL/uL (ref 3.87–5.11)
RDW: 15.1 % (ref 11.5–15.5)
WBC Count: 12.2 10*3/uL — ABNORMAL HIGH (ref 4.0–10.5)
nRBC: 0 % (ref 0.0–0.2)

## 2019-06-11 MED ORDER — ROMIPLOSTIM 250 MCG ~~LOC~~ SOLR
220.0000 ug | Freq: Once | SUBCUTANEOUS | Status: AC
  Administered 2019-06-11: 220 ug via SUBCUTANEOUS
  Filled 2019-06-11: qty 0.44

## 2019-06-11 NOTE — Patient Instructions (Signed)
Romiplostim injection What is this medicine? ROMIPLOSTIM (roe mi PLOE stim) helps your body make more platelets. This medicine is used to treat low platelets caused by chronic idiopathic thrombocytopenic purpura (ITP). This medicine may be used for other purposes; ask your health care provider or pharmacist if you have questions. COMMON BRAND NAME(S): Nplate What should I tell my health care provider before I take this medicine? They need to know if you have any of these conditions:  bleeding disorders  bone marrow problem, like blood cancer or myelodysplastic syndrome  history of blood clots  liver disease  surgery to remove your spleen  an unusual or allergic reaction to romiplostim, mannitol, other medicines, foods, dyes, or preservatives  pregnant or trying to get pregnant  breast-feeding How should I use this medicine? This medicine is for injection under the skin. It is given by a health care professional in a hospital or clinic setting. A special MedGuide will be given to you before your injection. Read this information carefully each time. Talk to your pediatrician regarding the use of this medicine in children. While this drug may be prescribed for children as young as 1 year for selected conditions, precautions do apply. Overdosage: If you think you have taken too much of this medicine contact a poison control center or emergency room at once. NOTE: This medicine is only for you. Do not share this medicine with others. What if I miss a dose? It is important not to miss your dose. Call your doctor or health care professional if you are unable to keep an appointment. What may interact with this medicine? Interactions are not expected. This list may not describe all possible interactions. Give your health care provider a list of all the medicines, herbs, non-prescription drugs, or dietary supplements you use. Also tell them if you smoke, drink alcohol, or use illegal drugs.  Some items may interact with your medicine. What should I watch for while using this medicine? Your condition will be monitored carefully while you are receiving this medicine. Visit your prescriber or health care professional for regular checks on your progress and for the needed blood tests. It is important to keep all appointments. What side effects may I notice from receiving this medicine? Side effects that you should report to your doctor or health care professional as soon as possible:  allergic reactions like skin rash, itching or hives, swelling of the face, lips, or tongue  signs and symptoms of bleeding such as bloody or black, tarry stools; red or dark brown urine; spitting up blood or brown material that looks like coffee grounds; red spots on the skin; unusual bruising or bleeding from the eyes, gums, or nose  signs and symptoms of a blood clot such as chest pain; shortness of breath; pain, swelling, or warmth in the leg  signs and symptoms of a stroke like changes in vision; confusion; trouble speaking or understanding; severe headaches; sudden numbness or weakness of the face, arm or leg; trouble walking; dizziness; loss of balance or coordination Side effects that usually do not require medical attention (report to your doctor or health care professional if they continue or are bothersome):  headache  pain in arms and legs  pain in mouth  stomach pain This list may not describe all possible side effects. Call your doctor for medical advice about side effects. You may report side effects to FDA at 1-800-FDA-1088. Where should I keep my medicine? This drug is given in a hospital or clinic   and will not be stored at home. NOTE: This sheet is a summary. It may not cover all possible information. If you have questions about this medicine, talk to your doctor, pharmacist, or health care provider.  2020 Elsevier/Gold Standard (2017-03-05 11:10:55)  

## 2019-06-11 NOTE — Progress Notes (Signed)
Pltc = 133 today. Dr. Mosetta Putt would like pt to get 2 mcg/kg today (220 mcg) Pt will return next week for another possible dose (rather than waiting 2 weeks).  Ebony Hail, Pharm.D., CPP 06/11/2019@9 :38 AM

## 2019-06-11 NOTE — Telephone Encounter (Signed)
Scheduled appt per 3/24 los - gave patient AVS and calender per los   

## 2019-06-16 ENCOUNTER — Ambulatory Visit: Payer: Self-pay | Admitting: Nurse Practitioner

## 2019-06-16 ENCOUNTER — Other Ambulatory Visit: Payer: Self-pay

## 2019-06-18 ENCOUNTER — Inpatient Hospital Stay: Payer: Self-pay

## 2019-06-18 ENCOUNTER — Other Ambulatory Visit: Payer: Self-pay | Admitting: Nurse Practitioner

## 2019-06-18 ENCOUNTER — Other Ambulatory Visit: Payer: Self-pay

## 2019-06-18 VITALS — BP 112/77 | HR 73 | Temp 98.5°F | Resp 19

## 2019-06-18 DIAGNOSIS — D693 Immune thrombocytopenic purpura: Secondary | ICD-10-CM

## 2019-06-18 LAB — CBC WITH DIFFERENTIAL (CANCER CENTER ONLY)
Abs Immature Granulocytes: 0.04 10*3/uL (ref 0.00–0.07)
Basophils Absolute: 0.1 10*3/uL (ref 0.0–0.1)
Basophils Relative: 0 %
Eosinophils Absolute: 0.2 10*3/uL (ref 0.0–0.5)
Eosinophils Relative: 2 %
HCT: 37 % (ref 36.0–46.0)
Hemoglobin: 12.1 g/dL (ref 12.0–15.0)
Immature Granulocytes: 0 %
Lymphocytes Relative: 35 %
Lymphs Abs: 3.9 10*3/uL (ref 0.7–4.0)
MCH: 28.9 pg (ref 26.0–34.0)
MCHC: 32.7 g/dL (ref 30.0–36.0)
MCV: 88.3 fL (ref 80.0–100.0)
Monocytes Absolute: 1 10*3/uL (ref 0.1–1.0)
Monocytes Relative: 9 %
Neutro Abs: 6 10*3/uL (ref 1.7–7.7)
Neutrophils Relative %: 54 %
Platelet Count: 119 10*3/uL — ABNORMAL LOW (ref 150–400)
RBC: 4.19 MIL/uL (ref 3.87–5.11)
RDW: 15 % (ref 11.5–15.5)
WBC Count: 11.2 10*3/uL — ABNORMAL HIGH (ref 4.0–10.5)
nRBC: 0 % (ref 0.0–0.2)

## 2019-06-18 MED ORDER — ROMIPLOSTIM 250 MCG ~~LOC~~ SOLR
220.0000 ug | Freq: Once | SUBCUTANEOUS | Status: AC
  Administered 2019-06-18: 11:00:00 220 ug via SUBCUTANEOUS
  Filled 2019-06-18: qty 0.44

## 2019-06-18 NOTE — Patient Instructions (Addendum)
Romiplostim injection Qu es este medicamento? El ROMIPLOSTIM ayuda a que su cuerpo produzca ms plaquetas. Este medicamento se [place] para tratar el nivel bajo de plaquetas causado por prpura trombocitopnica inmunitaria crnica (PTI). Este medicamento puede ser utilizado para otros usos; si tiene alguna pregunta consulte con su proveedor de atencin mdica o con su farmacutico. MARCAS COMUNES: Nplate Qu le debo informar a mi profesional de la salud antes de tomar este medicamento? Necesitan saber si usted presenta alguno de los [employer] o situaciones: trastornos de sangrado problema de mdula sea, tales como cncer en la sangre o sndrome mielodisplsico antecedentes de cogulos sanguneos enfermedad heptica ciruga para extirpar el bazo una reaccin alrgica o inusual al romiplostim, al manitol, a otros medicamentos, alimentos, colorantes o conservantes si est embarazada o buscando quedar embarazada si est amamantando a un beb Cmo debo utilizar este medicamento? Este medicamento se administra mediante una inyeccin por va subcutnea. Lo administra un profesional de Technical sales engineer en un hospital o en un entorno clnico. [place] Comment entregarn una Gua del medicamento especial (MedGuide, su nombre en ingls) antes de la inyeccin. Lea esta informacin cada vez cuidadosamente. Hable con su pediatra para informarse acerca del uso de este medicamento en nios. Aunque este medicamento se puede recetar a nios tan pequeos como de 1 ao de edad con ciertos padecimientos, existen precauciones que deben tomarse. Sobredosis: Pngase en contacto inmediatamente con un centro toxicolgico o una sala de urgencia si usted cree que haya tomado demasiado medicamento. ATENCIN: [employer] es solo para usted. No comparta este medicamento con nadie. Qu sucede si me olvido de una dosis? Es importante no olvidar ninguna dosis. Informe a su mdico o a su profesional de la salud si no puede asistir a  Photographer. Qu puede interactuar con este medicamento? No se esperan interacciones. Puede ser que esta lista no menciona todas las posibles interacciones. Informe a su profesional de [employer]	[place] de [employer] productos a base de hierbas, medicamentos de [place] o suplementos nutritivos que est tomando. Si usted fuma, consume bebidas alcohlicas o si utiliza drogas ilegales, indqueselo tambin a su profesional de [employer]	[place]. Algunas sustancias pueden interactuar con su medicamento. A qu debo estar atento al usar [employer]? Se supervisar su estado de salud atentamente mientras reciba este medicamento. Visite a su profesional que lo receta o a su profesional de la salud para Physicist, medical su evolucin peridicamente y para Dispensing optician a los anlisis de sangre necesarios. Es importante asistir a todas sus citas. Qu efectos secundarios puedo tener al [employer] este medicamento? Efectos secundarios que debe informar a su mdico o a Barrister's clerk de la salud tan pronto como sea posible: Chief of Staff, como erupcin cutnea, comezn/picazn o urticaria, e hinchazn de la cara, los labios o la lengua signos y sntomas de Teacher, music, tales como heces con sangre o de color negro y aspecto alquitranado; [place] de color rojo o marrn oscuro; escupir sangre o material marrn que tiene el aspecto de granos de caf molido; Tree surgeon rojas en la piel; sangrado o moretones inusuales en los ojos, las encas o la nariz signos y sntomas de un cogulo sanguneo, tales como dolor en el pecho; falta de aire; dolor, hinchazn o calor en la pierna signos y sntomas de un accidente cerebrovascular, tales como cambios en la visin; confusin; dificultad para hablar o entender; dolores de cabeza severos; entumecimiento o debilidad repentina de la cara, el brazo o la pierna; problemas al caminar; Chief of Staff; prdida del equilibrio o la coordinacin Efectos secundarios  que generalmente no requieren atencin mdica (infrmelos a su  mdico o a Producer, television/film/video de la salud si persisten o si son molestos): dolor de Public house manager en los brazos y las Programme researcher, broadcasting/film/video en la boca dolor estomacal Puede ser que esta lista no menciona todos los posibles efectos secundarios. Comunquese a su mdico por asesoramiento mdico [employer]. Usted puede informar los efectos secundarios a la FDA por telfono al 1-800-FDA-1088. Dnde debo guardar mi medicina? Este medicamento se administra en hospitales o clnicas y no necesitar guardarlo en su domicilio. ATENCIN: Este folleto es un resumen. Puede ser que no cubra toda la posible informacin. Si usted tiene preguntas acerca de esta medicina, consulte con su mdico, su farmacutico o su profesional de Radiographer, therapeutic.  2020 Elsevier/Gold Standard (2017-06-07 00:00:00)

## 2019-06-25 ENCOUNTER — Ambulatory Visit: Payer: Self-pay

## 2019-06-25 ENCOUNTER — Other Ambulatory Visit: Payer: Self-pay

## 2019-07-02 ENCOUNTER — Inpatient Hospital Stay: Payer: Self-pay | Attending: Nurse Practitioner

## 2019-07-02 ENCOUNTER — Other Ambulatory Visit: Payer: Self-pay

## 2019-07-02 ENCOUNTER — Telehealth: Payer: Self-pay | Admitting: Hematology

## 2019-07-02 ENCOUNTER — Encounter: Payer: Self-pay | Admitting: Hematology

## 2019-07-02 ENCOUNTER — Inpatient Hospital Stay: Payer: Self-pay

## 2019-07-02 ENCOUNTER — Inpatient Hospital Stay (HOSPITAL_BASED_OUTPATIENT_CLINIC_OR_DEPARTMENT_OTHER): Payer: Self-pay | Admitting: Hematology

## 2019-07-02 VITALS — BP 111/65 | HR 72 | Temp 98.3°F | Resp 18

## 2019-07-02 DIAGNOSIS — Z79899 Other long term (current) drug therapy: Secondary | ICD-10-CM | POA: Insufficient documentation

## 2019-07-02 DIAGNOSIS — D693 Immune thrombocytopenic purpura: Secondary | ICD-10-CM

## 2019-07-02 DIAGNOSIS — H538 Other visual disturbances: Secondary | ICD-10-CM | POA: Insufficient documentation

## 2019-07-02 DIAGNOSIS — Z8616 Personal history of COVID-19: Secondary | ICD-10-CM | POA: Insufficient documentation

## 2019-07-02 DIAGNOSIS — R42 Dizziness and giddiness: Secondary | ICD-10-CM | POA: Insufficient documentation

## 2019-07-02 DIAGNOSIS — Z7952 Long term (current) use of systemic steroids: Secondary | ICD-10-CM | POA: Insufficient documentation

## 2019-07-02 LAB — CBC
HCT: 36.5 % (ref 36.0–46.0)
Hemoglobin: 12 g/dL (ref 12.0–15.0)
MCH: 29.3 pg (ref 26.0–34.0)
MCHC: 32.9 g/dL (ref 30.0–36.0)
MCV: 89 fL (ref 80.0–100.0)
Platelets: 21 10*3/uL — ABNORMAL LOW (ref 150–400)
RBC: 4.1 MIL/uL (ref 3.87–5.11)
RDW: 14.7 % (ref 11.5–15.5)
WBC: 12 10*3/uL — ABNORMAL HIGH (ref 4.0–10.5)
nRBC: 0.2 % (ref 0.0–0.2)

## 2019-07-02 MED ORDER — ROMIPLOSTIM INJECTION 500 MCG
330.0000 ug | Freq: Once | SUBCUTANEOUS | Status: AC
  Administered 2019-07-02: 330 ug via SUBCUTANEOUS
  Filled 2019-07-02: qty 0.5

## 2019-07-02 MED ORDER — DEXAMETHASONE 4 MG PO TABS: 40.0000 mg | ORAL_TABLET | Freq: Every day | ORAL | 0 refills | Status: AC

## 2019-07-02 NOTE — Progress Notes (Signed)
[place]   Telephone:(336) [phone] [phone]   Clinic Follow up Note   Patient Care Team: Truitt Merle, MD as PCP - General (Hematology) Annia Belt, MD as Consulting Physician (Oncology) Stanford Breed (Hematology and Oncology) Danice Goltz, MD as Consulting Physician (Ophthalmology) Annie Main, MD (Internal Medicine)  Date of Service:  07/02/2019  CHIEF COMPLAINT: F/u of ITP   CURRENT THERAPY:  -Promacta 25mg  daily starting 10/22/18. Stopped 04/08/18 due to recurrent flares.  -Nplateq2weeks starting in 2 weeks  INTERVAL HISTORY:  [name] is here for a follow up of ITP. She presents to the clinic with her [employer] Spanish Interpretor. She denies any bleeding but has very mild bruise of her left posterior lower leg She notes she has had dizziness feeling lately.     REVIEW OF SYSTEMS:   Constitutional: Denies fevers, chills or abnormal weight loss Eyes: Denies blurriness of vision Ears, nose, mouth, throat, and face: Denies mucositis or sore throat Respiratory: Denies cough, dyspnea or wheezes Cardiovascular: Denies palpitation, chest discomfort or lower extremity swelling Gastrointestinal:  Denies nausea, heartburn or change in bowel habits Skin: Denies abnormal skin rashes Lymphatics: Denies new lymphadenopathy (+) Very mild bruise of posterior lower leg.  Neurological:Denies numbness, tingling or new weaknesses Behavioral/Psych: Mood is stable, no new changes  All other systems were reviewed with the patient and are negative.  MEDICAL HISTORY:  Past Medical History:  Diagnosis Date  . Anemia   . Chronic ITP (idiopathic thrombocytopenia) (HCC)   . Depression   . Headache   . 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 BREAST SURGERY  biopsy  . 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  ALLERGIES:  has No Known Allergies.  MEDICATIONS:  Current Outpatient Medications  Medication Sig Dispense Refill  . acetaminophen (TYLENOL) 500 MG tablet Take 1-2 tablets (500-1,000 mg total) by mouth every 6 (six) hours as needed for mild pain or moderate pain. (Patient taking differently: Take 500 mg by mouth daily as needed for mild pain or moderate pain. ) 30 tablet 0  . dexamethasone (DECADRON) 4 MG tablet Take 10 tablets (40 mg total) by mouth daily for 4 days. 40 tablet 0   No current facility-administered medications for this visit.    PHYSICAL EXAMINATION: ECOG PERFORMANCE STATUS: 1 - Symptomatic but completely ambulatory  There were no vitals filed for this visit. There were no vitals filed for this visit.  GENERAL:alert, no distress and comfortable SKIN: skin color, texture, turgor are normal, no rashes or significant lesions (+) Very mild left posterior lower leg bruise.  EYES: normal, Conjunctiva are pink and non-injected, sclera clear  NECK: supple, thyroid normal size, non-tender, without nodularity LYMPH:  no palpable lymphadenopathy in the cervical, axillary  LUNGS: clear to auscultation and percussion with normal breathing effort HEART: regular rate & rhythm and no murmurs and no lower extremity edema ABDOMEN:abdomen soft, non-tender and normal bowel sounds Musculoskeletal:no cyanosis of digits and no clubbing  NEURO: alert & oriented x 3 with fluent speech, no focal motor/sensory deficits  LABORATORY DATA:  I have reviewed the data as listed CBC Latest Ref  Rng & Units 07/02/2019 06/18/2019 06/11/2019  WBC 4.0 - 10.5 K/uL 12.0(H) 11.2(H) 12.2(H)  Hemoglobin 12.0 - 15.0 g/dL 27.0 35.0 09.3  Hematocrit 36.0 - 46.0 % 36.5 37.0 38.7    Platelets 150 - 400 K/uL 21(L) 119(L) 133(L)     CMP Latest Ref Rng & Units 04/20/2019 04/10/2019 04/09/2019  Glucose 70 - 99 mg/dL 818(E) 993(Z) 169(C)  BUN 6 - 20 mg/dL 7 14 14   Creatinine 0.44 - 1.00 mg/dL 7.89 3.81  Sodium 135 - 145 mmol/L 136 136 135  Potassium 3.5 - 5.1 mmol/L 3.5 3.8 3.8  Chloride 98 - 111 mmol/L 100 106 104  CO2 22 - 32 mmol/L 23 24 24   Calcium 8.9 - 10.3 mg/dL 9.0 8.9 9.2  Total Protein 6.5 - 8.1 g/dL - - 8.3(H)  Total Bilirubin 0.3 - 1.2 mg/dL - - 0.17)  Alkaline Phos 38 - 126 U/L - - 65  AST 15 - 41 U/L - - 17  ALT 0 - 44 U/L - - 21      RADIOGRAPHIC STUDIES: I have personally reviewed the radiological images as listed and agreed with the findings in the report. No results found.   ASSESSMENT & PLAN:  [name] is a 44 y.o. female with   1. 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 -She was hospitalizedagainon 10/14/18 for acute ITP flare. She was treated with dexamethasone, andone doseNPlate.CT head was negative forintracranial hemorrhage. Platelets improved from 17K to 36K.  -Due to her recurrent ITP flare,she iscurrently onmaintenance TPOoralPromacta25mg  daily starting 10/22/18.She is currently on patient assistance through 10/2019. -She has had 2 more ITP flares in 02/2019 (treated with dexa and NPlate and 1 dose IVIG and increased to 50mg  Promacta with discharge) and 03/2019 (treated with dexa, prednisone, Nplate and 1 dose IVIG).  -She has stopped Promacta since previous hospitalization. I started her on Nplate infusions every 2 weeks on 04/09/19 to better control her ITP and prevent acute flare.  -CBC  today shows her PLT has dropped to 21K, no clinical bleeding . Will give higher dose Nplate today at 37mcg/kg. I also recommend she take dexamethasone 40mg  daily for 4 days starting today. She is agreeable.  -repeat Lab in 2 and 5 days.  -f/u in next week with next Nplate.    2.History of small foci ofsubarachnoid hemorrhage -Secondary to #1 -recovered well overall.Will continue to monitor.  3.HA's  -She has been having HA when in recent hospital (03/2019) and since discharge, stable.  -Will monitor.  4. COVID19 (+) in early 04/2019 -She notes she had mild to moderate breathing issues. No fever. She was treated monoclonial antibodies.  -She has overall recovered well.  -She is fine to proceed with COVID19 vaccine 3 months after her tested positive.   PLAN: -Lab reviewed, PLT 21K. Will proceed with Nplate today with higher dose 1m  -I called in Dexamethasone today to take 10 tabs for 4 days starting today  -Lab on 4/16 and 4/19 -lab, Nplate and F/u next week.    No problem-specific Assessment & Plan notes found for this encounter.   No orders of the defined types were placed in this encounter.  All questions were answered. The patient knows to call the clinic with any problems, questions  or concerns. No barriers to learning was detected. The total time spent in the appointment was 20 minutes.     Malachy Mood, MD 07/02/2019   I, Delphina Cahill, am acting as scribe for Malachy Mood, MD.   I have reviewed the above documentation for accuracy and completeness, and I agree with the above.

## 2019-07-02 NOTE — Patient Instructions (Signed)
Romiplostim injection Qu es este medicamento? El ROMIPLOSTIM ayuda a que su cuerpo produzca ms plaquetas. Este medicamento se [place] para tratar el nivel bajo de plaquetas causado por prpura trombocitopnica inmunitaria crnica (PTI). Este medicamento puede ser utilizado para otros usos; si tiene alguna pregunta consulte con su proveedor de atencin mdica o con su farmacutico. MARCAS COMUNES: Nplate Qu le debo informar a mi profesional de la salud antes de tomar este medicamento? Necesitan saber si usted presenta alguno de los [employer] o situaciones: trastornos de sangrado problema de mdula sea, tales como cncer en la sangre o sndrome mielodisplsico antecedentes de cogulos sanguneos enfermedad heptica ciruga para extirpar el bazo una reaccin alrgica o inusual al romiplostim, al manitol, a otros medicamentos, alimentos, colorantes o conservantes si est embarazada o buscando quedar embarazada si est amamantando a un beb Cmo debo utilizar este medicamento? Este medicamento se administra mediante una inyeccin por va subcutnea. Lo administra un profesional de Technical sales engineer en un hospital o en un entorno clnico. Marin Comment entregarn una Gua del medicamento especial (MedGuide, su nombre en ingls) antes de la inyeccin. Lea esta informacin cada vez cuidadosamente. Hable con su pediatra para informarse acerca del uso de este medicamento en nios. Aunque este medicamento se puede recetar a nios tan pequeos como de 1 ao de edad con ciertos padecimientos, existen precauciones que deben tomarse. Sobredosis: Pngase en contacto inmediatamente con un centro toxicolgico o una sala de urgencia si usted cree que haya tomado demasiado medicamento. ATENCIN: [employer] es solo para usted. No comparta este medicamento con nadie. Qu sucede si me olvido de una dosis? Es importante no olvidar ninguna dosis. Informe a su mdico o a su profesional de la salud si no puede asistir a  Photographer. Qu puede interactuar con este medicamento? No se esperan interacciones. Puede ser que esta lista no menciona todas las posibles interacciones. Informe a su profesional de [employer]	[place] de [employer] productos a base de hierbas, medicamentos de [place] o suplementos nutritivos que est tomando. Si usted fuma, consume bebidas alcohlicas o si utiliza drogas ilegales, indqueselo tambin a su profesional de [employer]	[place]. Algunas sustancias pueden interactuar con su medicamento. A qu debo estar atento al usar [employer]? Se supervisar su estado de salud atentamente mientras reciba este medicamento. Visite a su profesional que lo receta o a su profesional de la salud para Physicist, medical su evolucin peridicamente y para Dispensing optician a los anlisis de sangre necesarios. Es importante asistir a todas sus citas. Qu efectos secundarios puedo tener al [employer] este medicamento? Efectos secundarios que debe informar a su mdico o a Barrister's clerk de la salud tan pronto como sea posible: Chief of Staff, como erupcin cutnea, comezn/picazn o urticaria, e hinchazn de la cara, los labios o la lengua signos y sntomas de Teacher, music, tales como heces con sangre o de color negro y aspecto alquitranado; [place] de color rojo o marrn oscuro; escupir sangre o material marrn que tiene el aspecto de granos de caf molido; Tree surgeon rojas en la piel; sangrado o moretones inusuales en los ojos, las encas o la nariz signos y sntomas de un cogulo sanguneo, tales como dolor en el pecho; falta de aire; dolor, hinchazn o calor en la pierna signos y sntomas de un accidente cerebrovascular, tales como cambios en la visin; confusin; dificultad para hablar o entender; dolores de cabeza severos; entumecimiento o debilidad repentina de la cara, el brazo o la pierna; problemas al caminar; Chief of Staff; prdida del equilibrio o la coordinacin Efectos secundarios  que generalmente no requieren atencin mdica (infrmelos a su  mdico o a Producer, television/film/video de la salud si persisten o si son molestos): dolor de Public house manager en los brazos y las Programme researcher, broadcasting/film/video en la boca dolor estomacal Puede ser que esta lista no menciona todos los posibles efectos secundarios. Comunquese a su mdico por asesoramiento mdico [employer]. Usted puede informar los efectos secundarios a la FDA por telfono al 1-800-FDA-1088. Dnde debo guardar mi medicina? Este medicamento se administra en hospitales o clnicas y no necesitar guardarlo en su domicilio. ATENCIN: Este folleto es un resumen. Puede ser que no cubra toda la posible informacin. Si usted tiene preguntas acerca de esta medicina, consulte con su mdico, su farmacutico o su profesional de Radiographer, therapeutic.  2020 Elsevier/Gold Standard (2017-06-07 00:00:00)

## 2019-07-02 NOTE — Telephone Encounter (Signed)
Scheduled per los. Gave avs and calendar  

## 2019-07-04 ENCOUNTER — Other Ambulatory Visit: Payer: Self-pay

## 2019-07-07 ENCOUNTER — Other Ambulatory Visit: Payer: Self-pay

## 2019-07-07 ENCOUNTER — Inpatient Hospital Stay: Payer: Self-pay

## 2019-07-07 DIAGNOSIS — D693 Immune thrombocytopenic purpura: Secondary | ICD-10-CM

## 2019-07-07 LAB — CBC WITH DIFFERENTIAL (CANCER CENTER ONLY)
Abs Immature Granulocytes: 0.81 10*3/uL — ABNORMAL HIGH (ref 0.00–0.07)
Basophils Absolute: 0 10*3/uL (ref 0.0–0.1)
Basophils Relative: 0 %
Eosinophils Absolute: 0 10*3/uL (ref 0.0–0.5)
Eosinophils Relative: 0 %
HCT: 39.5 % (ref 36.0–46.0)
Hemoglobin: 12.8 g/dL (ref 12.0–15.0)
Immature Granulocytes: 4 %
Lymphocytes Relative: 15 %
Lymphs Abs: 3.1 10*3/uL (ref 0.7–4.0)
MCH: 28.4 pg (ref 26.0–34.0)
MCHC: 32.4 g/dL (ref 30.0–36.0)
MCV: 87.6 fL (ref 80.0–100.0)
Monocytes Absolute: 0.9 10*3/uL (ref 0.1–1.0)
Monocytes Relative: 4 %
Neutro Abs: 15.3 10*3/uL — ABNORMAL HIGH (ref 1.7–7.7)
Neutrophils Relative %: 77 %
Platelet Count: 240 10*3/uL (ref 150–400)
RBC: 4.51 MIL/uL (ref 3.87–5.11)
RDW: 14.6 % (ref 11.5–15.5)
WBC Count: 20.2 10*3/uL — ABNORMAL HIGH (ref 4.0–10.5)
nRBC: 0.3 % — ABNORMAL HIGH (ref 0.0–0.2)

## 2019-07-10 ENCOUNTER — Inpatient Hospital Stay: Payer: Self-pay

## 2019-07-10 ENCOUNTER — Inpatient Hospital Stay: Payer: Self-pay | Admitting: Hematology

## 2019-07-14 NOTE — Progress Notes (Signed)
[facility]   Telephone:(336) [phone] Fax:(336) [phone]   Clinic Follow up Note   Patient Care Team: Alice Mood, MD as PCP - General (Hematology) Alice Feinstein, MD as Consulting Physician (Oncology) [name] (Hematology and Oncology) Alice Mocha, MD as Consulting Physician (Ophthalmology) Alice Deeds, MD (Internal Medicine)  Date of Service:  07/16/2019  CHIEF COMPLAINT: F/u of ITP  CURRENT THERAPY:  -Promacta 25mg  daily starting 10/22/18. Stopped 04/08/18 due to recurrent flares.  -Nplateq2weeks starting in 2 weeks  INTERVAL HISTORY:  [name] is here for a follow up of ITP. She presents to the clinic with her Spanish interpretor. She notes she is doing well. For the last 3 weeks she has vision blurriness with both eyes. She can read a book and watch TV but with distant vision are blurry. She does have ophthalmologist who she say 1 year ago. She denies HA but had dizziness last week, not resolved. She did not chemo her BP at that time.    REVIEW OF SYSTEMS:   Constitutional: Denies fevers, chills or abnormal weight loss Eyes: (+) B/l blurriness of vision Ears, nose, mouth, throat, and face: Denies mucositis or sore throat Respiratory: Denies cough, dyspnea or wheezes Cardiovascular: Denies palpitation, chest discomfort or lower extremity swelling Gastrointestinal:  Denies nausea, heartburn or change in bowel habits Skin: Denies abnormal skin rashes Lymphatics: Denies new lymphadenopathy or easy bruising Neurological:Denies numbness, tingling or new weaknesses Behavioral/Psych: Holland is stable, no new changes  All other systems were reviewed with the patient and are negative.  MEDICAL HISTORY:  Past Medical History:  Diagnosis Date  . Anemia   . Chronic ITP (idiopathic thrombocytopenia) (HCC)   . Depression   . Headache   . 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    SURGICAL  HISTORY: Past Surgical History:  Procedure Laterality Date  . BREAST SURGERY  biopsy  . 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  ALLERGIES:  has No Known Allergies.  MEDICATIONS:  Current Outpatient Medications  Medication Sig Dispense Refill  . acetaminophen (TYLENOL) 500 MG tablet Take 1-2 tablets (500-1,000 mg total) by mouth every 6 (six) hours as needed for mild pain or moderate pain. (Patient taking differently: Take 500 mg by mouth daily as needed for mild pain or moderate pain. ) 30 tablet 0   No current facility-administered medications for this visit.    PHYSICAL EXAMINATION: ECOG PERFORMANCE STATUS: 1 - Symptomatic but completely ambulatory  Vitals:   07/16/19 0938  BP: 118/71  Pulse: 79  Resp: 18  Temp: 98.2 F (36.8 C)  SpO2: 97%   Filed Weights   07/16/19 0938  Weight: 249 lb 1.6 oz (113 kg)    Due to COVID19 we will limit examination to appearance. Patient had no complaints.  GENERAL:alert, no distress and comfortable SKIN: skin color normal, no rashes or significant lesions EYES: normal, Conjunctiva are pink and non-injected, sclera clear  NEURO: alert & oriented x 3 with fluent speech   LABORATORY DATA:  I have reviewed the data as listed CBC Latest Ref Rng & Units 07/16/2019 07/07/2019 07/02/2019  WBC 4.0 - 10.5 K/uL 13.4(H) 20.2(H) 12.0(H)  Hemoglobin 12.0 - 15.0 g/dL 11.8(L) 12.8 12.0  Hematocrit 36.0 - 46.0 % 36.8 39.5 36.5  Platelets 150 - 400 K/uL 272 240 21(L)     CMP Latest Ref Rng & Units 04/20/2019 04/10/2019 04/09/2019  Glucose 70 - 99 mg/dL 131(H) 143(H) 211(H)  BUN 6 - 20 mg/dL 7 14 14   Creatinine 0.44 - 1.00 mg/dL 0.70 0.66 0.65  Sodium 135 - 145 mmol/L 136 136 135  Potassium 3.5 - 5.1 mmol/L 3.5 3.8 3.8    Chloride 98 - 111 mmol/L 100 106 104  CO2 22 - 32 mmol/L 23 24 24   Calcium 8.9 - 10.3 mg/dL 9.0 8.9 9.2  Total Protein 6.5 - 8.1 g/dL - - 8.3(H)  Total Bilirubin 0.3 - 1.2 mg/dL - - 0.2(L)  Alkaline Phos 38 - 126 U/L - - 65  AST 15 - 41 U/L - - 17  ALT 0 - 44 U/L - - 21      RADIOGRAPHIC STUDIES: I have personally reviewed the radiological images as listed and agreed with the findings in the report. No results found.   ASSESSMENT & PLAN:  [name] is a 44 y.o. female with    1. 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 -She was hospitalizedagainon 10/14/18 for acute ITP flare. She was treated with dexamethasone, andone doseNPlate.CT head was negative forintracranial hemorrhage. Platelets improved from 17K to 36K.  -Due to her recurrent ITP flare,she iscurrently onmaintenance TPOoralPromacta25mg  daily starting 10/22/18.She is currently on patient assistance through 10/2019. -She has had 2 more ITP flares in 02/2019 (treated with dexa and NPlate and 1 dose IVIG and increased to 50mg  Promacta with discharge) and 03/2019 (treated with dexa, prednisone, Nplate and 1 dose IVIG).  -She has stopped Promacta sinceprevioushospitalization. I started her on Nplate infusions every 2weekson 04/09/19 to better control her ITPand prevent acute flare. -Due to decreased plt on 07/02/19 at 21K, I gave her higher dose Nplate at 161WRU same day and she took 10 tabs of Dexamethasone over 4 days.  -Labs reviewed, WBC 13.4, HG 11.8, ANC 8.6, PLT normal. Will return to every 2 weeks Nplate injections.  -F/u in 6 weeks. I encouraged her to sign of for MYChart to  keep track of her appointments.    2.History of small foci ofsubarachnoid hemorrhage -Secondary to #1 -recovered well overall.Will continue to monitor.  3.HA's  -She has been having HA when in recent hospital (03/2019) and since discharge, stable.  -Will monitor.  4. COVID19 (+) in early 04/2019 -She notes she had mild to moderate breathing issues. No fever. She was treated monoclonial antibodies.  -She has overall recovered well. -She is fine to proceed with COVID19 vaccine 3 months after her tested positive.   5. Blurry Vision  -For the past 3 weeks she has had blurred vision. She only had 1 episode of dizziness, no HA.  -I recommend she f/u with her Ophthalmologist.    PLAN: -Labs reviewed, will proceed with Nplate today, dose 73mcg/kg -lab, Nplate every 2 weeks. If plt<150k, will add weekly injection until plt normalize  -F/uin 6 weeks    No problem-specific Assessment & Plan notes found for this encounter.   No orders of the defined types were placed in this encounter.  All questions were answered. The patient knows to call the clinic with  any problems, questions or concerns. No barriers to learning was detected. The total time spent in the appointment was 20 minutes.     Alice Mood, MD 07/16/2019   I, Delphina Cahill, am acting as scribe for Alice Mood, MD.   I have reviewed the above documentation for accuracy and completeness, and I agree with the above.

## 2019-07-16 ENCOUNTER — Inpatient Hospital Stay (HOSPITAL_BASED_OUTPATIENT_CLINIC_OR_DEPARTMENT_OTHER): Payer: Self-pay | Admitting: Hematology

## 2019-07-16 ENCOUNTER — Encounter: Payer: Self-pay | Admitting: Hematology

## 2019-07-16 ENCOUNTER — Inpatient Hospital Stay: Payer: Self-pay

## 2019-07-16 ENCOUNTER — Other Ambulatory Visit: Payer: Self-pay

## 2019-07-16 VITALS — BP 118/71 | HR 79 | Temp 98.2°F | Resp 18 | Ht 59.0 in | Wt 249.1 lb

## 2019-07-16 DIAGNOSIS — D693 Immune thrombocytopenic purpura: Secondary | ICD-10-CM

## 2019-07-16 LAB — CBC WITH DIFFERENTIAL (CANCER CENTER ONLY)
Abs Immature Granulocytes: 0.23 10*3/uL — ABNORMAL HIGH (ref 0.00–0.07)
Basophils Absolute: 0 10*3/uL (ref 0.0–0.1)
Basophils Relative: 0 %
Eosinophils Absolute: 0.2 10*3/uL (ref 0.0–0.5)
Eosinophils Relative: 2 %
HCT: 36.8 % (ref 36.0–46.0)
Hemoglobin: 11.8 g/dL — ABNORMAL LOW (ref 12.0–15.0)
Immature Granulocytes: 2 %
Lymphocytes Relative: 25 %
Lymphs Abs: 3.3 10*3/uL (ref 0.7–4.0)
MCH: 28.9 pg (ref 26.0–34.0)
MCHC: 32.1 g/dL (ref 30.0–36.0)
MCV: 90.2 fL (ref 80.0–100.0)
Monocytes Absolute: 1 10*3/uL (ref 0.1–1.0)
Monocytes Relative: 8 %
Neutro Abs: 8.6 10*3/uL — ABNORMAL HIGH (ref 1.7–7.7)
Neutrophils Relative %: 63 %
Platelet Count: 272 10*3/uL (ref 150–400)
RBC: 4.08 MIL/uL (ref 3.87–5.11)
RDW: 15 % (ref 11.5–15.5)
WBC Count: 13.4 10*3/uL — ABNORMAL HIGH (ref 4.0–10.5)
nRBC: 0 % (ref 0.0–0.2)

## 2019-07-16 MED ORDER — ROMIPLOSTIM 250 MCG ~~LOC~~ SOLR
2.0000 ug/kg | Freq: Once | SUBCUTANEOUS | Status: AC
  Administered 2019-07-16: 10:00:00 225 ug via SUBCUTANEOUS
  Filled 2019-07-16: qty 0.45

## 2019-07-16 NOTE — Progress Notes (Signed)
Met with uninsured patient with [name] interpreter present to provide letter of denial from [place] which was also mailed to patient due to not providing documentation requested for Medicaid. Patient must reapply for Medicaid. Gave her a Medicaid application to complete and turn into DSS. Also gave patient a Patent examiner and had interpreter explain it is only to be completed if she is denied for Medicaid and receives a denial letter/reason.  Provided my card for any additional financial questions or concerns.

## 2019-07-16 NOTE — Patient Instructions (Signed)
Romiplostim injection Qu es este medicamento? El ROMIPLOSTIM ayuda a que su cuerpo produzca ms plaquetas. Este medicamento se utiliza para tratar el nivel bajo de plaquetas causado por prpura trombocitopnica inmunitaria crnica (PTI). Este medicamento puede ser utilizado para otros usos; si tiene alguna pregunta consulte con su proveedor de atencin mdica o con su farmacutico. MARCAS COMUNES: Nplate Qu le debo informar a mi profesional de la salud antes de tomar este medicamento? Necesitan saber si usted presenta alguno de los siguientes problemas o situaciones: trastornos de sangrado problema de mdula sea, tales como cncer en la sangre o sndrome mielodisplsico antecedentes de cogulos sanguneos enfermedad heptica ciruga para extirpar el bazo una reaccin alrgica o inusual al romiplostim, al manitol, a otros medicamentos, alimentos, colorantes o conservantes si est embarazada o buscando quedar embarazada si est amamantando a un beb Cmo debo utilizar este medicamento? Este medicamento se administra mediante una inyeccin por va subcutnea. Lo administra un profesional de la salud en un hospital o en un entorno clnico. Le entregarn una Gua del medicamento especial (MedGuide, su nombre en ingls) antes de la inyeccin. Lea esta informacin cada vez cuidadosamente. Hable con su pediatra para informarse acerca del uso de este medicamento en nios. Aunque este medicamento se puede recetar a nios tan pequeos como de 1 ao de edad con ciertos padecimientos, existen precauciones que deben tomarse. Sobredosis: Pngase en contacto inmediatamente con un centro toxicolgico o una sala de urgencia si usted cree que haya tomado demasiado medicamento. ATENCIN: Este medicamento es solo para usted. No comparta este medicamento con nadie. Qu sucede si me olvido de una dosis? Es importante no olvidar ninguna dosis. Informe a su mdico o a su profesional de la salud si no puede asistir a  una cita. Qu puede interactuar con este medicamento? No se esperan interacciones. Puede ser que esta lista no menciona todas las posibles interacciones. Informe a su profesional de la salud de todos los productos a base de hierbas, medicamentos de venta libre o suplementos nutritivos que est tomando. Si usted fuma, consume bebidas alcohlicas o si utiliza drogas ilegales, indqueselo tambin a su profesional de la salud. Algunas sustancias pueden interactuar con su medicamento. A qu debo estar atento al usar este medicamento? Se supervisar su estado de salud atentamente mientras reciba este medicamento. Visite a su profesional que lo receta o a su profesional de la salud para chequear su evolucin peridicamente y para realizarse a los anlisis de sangre necesarios. Es importante asistir a todas sus citas. Qu efectos secundarios puedo tener al utilizar este medicamento? Efectos secundarios que debe informar a su mdico o a su profesional de la salud tan pronto como sea posible: reacciones alrgicas, como erupcin cutnea, comezn/picazn o urticaria, e hinchazn de la cara, los labios o la lengua signos y sntomas de sangrado, tales como heces con sangre o de color negro y aspecto alquitranado; orina de color rojo o marrn oscuro; escupir sangre o material marrn que tiene el aspecto de granos de caf molido; manchas rojas en la piel; sangrado o moretones inusuales en los ojos, las encas o la nariz signos y sntomas de un cogulo sanguneo, tales como dolor en el pecho; falta de aire; dolor, hinchazn o calor en la pierna signos y sntomas de un accidente cerebrovascular, tales como cambios en la visin; confusin; dificultad para hablar o entender; dolores de cabeza severos; entumecimiento o debilidad repentina de la cara, el brazo o la pierna; problemas al caminar; mareo; prdida del equilibrio o la coordinacin Efectos secundarios   que generalmente no requieren atencin mdica (infrmelos a su  mdico o a Producer, television/film/video de la salud si persisten o si son molestos): dolor de Public house manager en los brazos y las Programme researcher, broadcasting/film/video en la boca dolor estomacal Puede ser que esta lista no menciona todos los posibles efectos secundarios. Comunquese a su mdico por asesoramiento mdico [employer]. Usted puede informar los efectos secundarios a la FDA por telfono al 1-800-FDA-1088. Dnde debo guardar mi medicina? Este medicamento se administra en hospitales o clnicas y no necesitar guardarlo en su domicilio. ATENCIN: Este folleto es un resumen. Puede ser que no cubra toda la posible informacin. Si usted tiene preguntas acerca de esta medicina, consulte con su mdico, su farmacutico o su profesional de Radiographer, therapeutic.  2020 Elsevier/Gold Standard (2017-06-07 00:00:00)

## 2019-07-30 ENCOUNTER — Inpatient Hospital Stay: Payer: Self-pay

## 2019-07-30 ENCOUNTER — Other Ambulatory Visit: Payer: Self-pay

## 2019-07-30 ENCOUNTER — Inpatient Hospital Stay: Payer: Self-pay | Attending: Nurse Practitioner

## 2019-07-30 VITALS — BP 120/62 | HR 73 | Temp 98.0°F | Resp 18

## 2019-07-30 DIAGNOSIS — Z79899 Other long term (current) drug therapy: Secondary | ICD-10-CM | POA: Insufficient documentation

## 2019-07-30 DIAGNOSIS — D693 Immune thrombocytopenic purpura: Secondary | ICD-10-CM

## 2019-07-30 DIAGNOSIS — Z8616 Personal history of COVID-19: Secondary | ICD-10-CM | POA: Insufficient documentation

## 2019-07-30 DIAGNOSIS — R519 Headache, unspecified: Secondary | ICD-10-CM | POA: Insufficient documentation

## 2019-07-30 DIAGNOSIS — H538 Other visual disturbances: Secondary | ICD-10-CM | POA: Insufficient documentation

## 2019-07-30 DIAGNOSIS — R42 Dizziness and giddiness: Secondary | ICD-10-CM | POA: Insufficient documentation

## 2019-07-30 DIAGNOSIS — R5383 Other fatigue: Secondary | ICD-10-CM | POA: Insufficient documentation

## 2019-07-30 DIAGNOSIS — Z9081 Acquired absence of spleen: Secondary | ICD-10-CM | POA: Insufficient documentation

## 2019-07-30 LAB — CBC WITH DIFFERENTIAL (CANCER CENTER ONLY)
Abs Immature Granulocytes: 0.16 10*3/uL — ABNORMAL HIGH (ref 0.00–0.07)
Basophils Absolute: 0.1 10*3/uL (ref 0.0–0.1)
Basophils Relative: 1 %
Eosinophils Absolute: 0.2 10*3/uL (ref 0.0–0.5)
Eosinophils Relative: 2 %
HCT: 38.7 % (ref 36.0–46.0)
Hemoglobin: 12.3 g/dL (ref 12.0–15.0)
Immature Granulocytes: 1 %
Lymphocytes Relative: 25 %
Lymphs Abs: 3.1 10*3/uL (ref 0.7–4.0)
MCH: 28.7 pg (ref 26.0–34.0)
MCHC: 31.8 g/dL (ref 30.0–36.0)
MCV: 90.4 fL (ref 80.0–100.0)
Monocytes Absolute: 1.1 10*3/uL — ABNORMAL HIGH (ref 0.1–1.0)
Monocytes Relative: 9 %
Neutro Abs: 7.8 10*3/uL — ABNORMAL HIGH (ref 1.7–7.7)
Neutrophils Relative %: 62 %
Platelet Count: 193 10*3/uL (ref 150–400)
RBC: 4.28 MIL/uL (ref 3.87–5.11)
RDW: 14.6 % (ref 11.5–15.5)
WBC Count: 12.5 10*3/uL — ABNORMAL HIGH (ref 4.0–10.5)
nRBC: 0.2 % (ref 0.0–0.2)

## 2019-07-30 MED ORDER — ROMIPLOSTIM 250 MCG ~~LOC~~ SOLR
2.0000 ug/kg | Freq: Once | SUBCUTANEOUS | Status: AC
  Administered 2019-07-30: 225 ug via SUBCUTANEOUS
  Filled 2019-07-30: qty 0.45

## 2019-07-30 NOTE — Patient Instructions (Signed)
Romiplostim injection Qu es este medicamento? El ROMIPLOSTIM ayuda a que su cuerpo produzca ms plaquetas. Este medicamento se utiliza para tratar el nivel bajo de plaquetas causado por prpura trombocitopnica inmunitaria crnica (PTI). Este medicamento puede ser utilizado para otros usos; si tiene alguna pregunta consulte con su proveedor de atencin mdica o con su farmacutico. MARCAS COMUNES: Nplate Qu le debo informar a mi profesional de la salud antes de tomar este medicamento? Necesitan saber si usted presenta alguno de los siguientes problemas o situaciones: trastornos de sangrado problema de mdula sea, tales como cncer en la sangre o sndrome mielodisplsico antecedentes de cogulos sanguneos enfermedad heptica ciruga para extirpar el bazo una reaccin alrgica o inusual al romiplostim, al manitol, a otros medicamentos, alimentos, colorantes o conservantes si est embarazada o buscando quedar embarazada si est amamantando a un beb Cmo debo utilizar este medicamento? Este medicamento se administra mediante una inyeccin por va subcutnea. Lo administra un profesional de la salud en un hospital o en un entorno clnico. Le entregarn una Gua del medicamento especial (MedGuide, su nombre en ingls) antes de la inyeccin. Lea esta informacin cada vez cuidadosamente. Hable con su pediatra para informarse acerca del uso de este medicamento en nios. Aunque este medicamento se puede recetar a nios tan pequeos como de 1 ao de edad con ciertos padecimientos, existen precauciones que deben tomarse. Sobredosis: Pngase en contacto inmediatamente con un centro toxicolgico o una sala de urgencia si usted cree que haya tomado demasiado medicamento. ATENCIN: Este medicamento es solo para usted. No comparta este medicamento con nadie. Qu sucede si me olvido de una dosis? Es importante no olvidar ninguna dosis. Informe a su mdico o a su profesional de la salud si no puede asistir a  una cita. Qu puede interactuar con este medicamento? No se esperan interacciones. Puede ser que esta lista no menciona todas las posibles interacciones. Informe a su profesional de la salud de todos los productos a base de hierbas, medicamentos de venta libre o suplementos nutritivos que est tomando. Si usted fuma, consume bebidas alcohlicas o si utiliza drogas ilegales, indqueselo tambin a su profesional de la salud. Algunas sustancias pueden interactuar con su medicamento. A qu debo estar atento al usar este medicamento? Se supervisar su estado de salud atentamente mientras reciba este medicamento. Visite a su profesional que lo receta o a su profesional de la salud para chequear su evolucin peridicamente y para realizarse a los anlisis de sangre necesarios. Es importante asistir a todas sus citas. Qu efectos secundarios puedo tener al utilizar este medicamento? Efectos secundarios que debe informar a su mdico o a su profesional de la salud tan pronto como sea posible: reacciones alrgicas, como erupcin cutnea, comezn/picazn o urticaria, e hinchazn de la cara, los labios o la lengua signos y sntomas de sangrado, tales como heces con sangre o de color negro y aspecto alquitranado; orina de color rojo o marrn oscuro; escupir sangre o material marrn que tiene el aspecto de granos de caf molido; manchas rojas en la piel; sangrado o moretones inusuales en los ojos, las encas o la nariz signos y sntomas de un cogulo sanguneo, tales como dolor en el pecho; falta de aire; dolor, hinchazn o calor en la pierna signos y sntomas de un accidente cerebrovascular, tales como cambios en la visin; confusin; dificultad para hablar o entender; dolores de cabeza severos; entumecimiento o debilidad repentina de la cara, el brazo o la pierna; problemas al caminar; mareo; prdida del equilibrio o la coordinacin Efectos secundarios   que generalmente no requieren atencin mdica (infrmelos a su  mdico o a Producer, television/film/video de la salud si persisten o si son molestos): dolor de Public house manager en los brazos y las Programme researcher, broadcasting/film/video en la boca dolor estomacal Puede ser que esta lista no menciona todos los posibles efectos secundarios. Comunquese a su mdico por asesoramiento mdico [employer]. Usted puede informar los efectos secundarios a la FDA por telfono al 1-800-FDA-1088. Dnde debo guardar mi medicina? Este medicamento se administra en hospitales o clnicas y no necesitar guardarlo en su domicilio. ATENCIN: Este folleto es un resumen. Puede ser que no cubra toda la posible informacin. Si usted tiene preguntas acerca de esta medicina, consulte con su mdico, su farmacutico o su profesional de Radiographer, therapeutic.  2020 Elsevier/Gold Standard (2017-06-07 00:00:00)

## 2019-08-04 ENCOUNTER — Inpatient Hospital Stay (HOSPITAL_COMMUNITY)
Admission: AD | Admit: 2019-08-04 | Discharge: 2019-08-06 | DRG: 813 | Disposition: A | Discharge status: Home / Self Care | Payer: Self-pay | Source: Ambulatory Visit | Attending: Hematology | Admitting: Hematology

## 2019-08-04 ENCOUNTER — Other Ambulatory Visit: Payer: Self-pay

## 2019-08-04 ENCOUNTER — Inpatient Hospital Stay: Payer: Self-pay

## 2019-08-04 ENCOUNTER — Telehealth: Payer: Self-pay

## 2019-08-04 ENCOUNTER — Inpatient Hospital Stay (HOSPITAL_BASED_OUTPATIENT_CLINIC_OR_DEPARTMENT_OTHER): Payer: Self-pay | Admitting: Hematology

## 2019-08-04 ENCOUNTER — Encounter (HOSPITAL_COMMUNITY): Payer: Self-pay | Admitting: Hematology

## 2019-08-04 ENCOUNTER — Encounter: Payer: Self-pay | Admitting: Hematology

## 2019-08-04 VITALS — BP 127/92 | HR 67 | Temp 98.7°F | Resp 20 | Ht 59.0 in | Wt 245.0 lb

## 2019-08-04 DIAGNOSIS — D649 Anemia, unspecified: Secondary | ICD-10-CM | POA: Diagnosis present

## 2019-08-04 DIAGNOSIS — D693 Immune thrombocytopenic purpura: Secondary | ICD-10-CM

## 2019-08-04 DIAGNOSIS — R519 Headache, unspecified: Secondary | ICD-10-CM | POA: Diagnosis present

## 2019-08-04 DIAGNOSIS — F329 Major depressive disorder, single episode, unspecified: Secondary | ICD-10-CM | POA: Diagnosis present

## 2019-08-04 DIAGNOSIS — Z862 Personal history of diseases of the blood and blood-forming organs and certain disorders involving the immune mechanism: Secondary | ICD-10-CM

## 2019-08-04 DIAGNOSIS — D696 Thrombocytopenia, unspecified: Secondary | ICD-10-CM

## 2019-08-04 DIAGNOSIS — Z803 Family history of malignant neoplasm of breast: Secondary | ICD-10-CM

## 2019-08-04 DIAGNOSIS — Z91410 Personal history of adult physical and sexual abuse: Secondary | ICD-10-CM

## 2019-08-04 DIAGNOSIS — D72829 Elevated white blood cell count, unspecified: Secondary | ICD-10-CM | POA: Diagnosis present

## 2019-08-04 DIAGNOSIS — H538 Other visual disturbances: Secondary | ICD-10-CM | POA: Diagnosis present

## 2019-08-04 DIAGNOSIS — R42 Dizziness and giddiness: Secondary | ICD-10-CM | POA: Diagnosis present

## 2019-08-04 DIAGNOSIS — Z833 Family history of diabetes mellitus: Secondary | ICD-10-CM

## 2019-08-04 DIAGNOSIS — Z9221 Personal history of antineoplastic chemotherapy: Secondary | ICD-10-CM

## 2019-08-04 DIAGNOSIS — Z79899 Other long term (current) drug therapy: Secondary | ICD-10-CM

## 2019-08-04 DIAGNOSIS — Z9081 Acquired absence of spleen: Secondary | ICD-10-CM

## 2019-08-04 LAB — CBC WITH DIFFERENTIAL (CANCER CENTER ONLY)
Abs Immature Granulocytes: 0.23 10*3/uL — ABNORMAL HIGH (ref 0.00–0.07)
Basophils Absolute: 0.1 10*3/uL (ref 0.0–0.1)
Basophils Relative: 1 %
Eosinophils Absolute: 0.1 10*3/uL (ref 0.0–0.5)
Eosinophils Relative: 1 %
HCT: 39 % (ref 36.0–46.0)
Hemoglobin: 12.8 g/dL (ref 12.0–15.0)
Immature Granulocytes: 2 %
Lymphocytes Relative: 28 %
Lymphs Abs: 3.6 10*3/uL (ref 0.7–4.0)
MCH: 28.9 pg (ref 26.0–34.0)
MCHC: 32.8 g/dL (ref 30.0–36.0)
MCV: 88 fL (ref 80.0–100.0)
Monocytes Absolute: 0.9 10*3/uL (ref 0.1–1.0)
Monocytes Relative: 7 %
Neutro Abs: 7.9 10*3/uL — ABNORMAL HIGH (ref 1.7–7.7)
Neutrophils Relative %: 61 %
Platelet Count: <5 10*3/uL — CL (ref 150–400)
RBC: 4.43 MIL/uL (ref 3.87–5.11)
RDW: 14.2 % (ref 11.5–15.5)
WBC Count: 12.8 10*3/uL — ABNORMAL HIGH (ref 4.0–10.5)
nRBC: 0.3 % — ABNORMAL HIGH (ref 0.0–0.2)

## 2019-08-04 MED ORDER — IMMUNE GLOBULIN (HUMAN) 20 GM/200ML IV SOLN
80.0000 g | Freq: Once | INTRAVENOUS | Status: AC
  Administered 2019-08-04: 80 g via INTRAVENOUS
  Filled 2019-08-04: qty 800

## 2019-08-04 MED ORDER — IMMUNE GLOBULIN (HUMAN) 20 GM/200ML IV SOLN
80.0000 g | Freq: Once | INTRAVENOUS | Status: DC
  Filled 2019-08-04: qty 800

## 2019-08-04 MED ORDER — PANTOPRAZOLE SODIUM 40 MG PO TBEC
40.0000 mg | DELAYED_RELEASE_TABLET | Freq: Every day | ORAL | Status: DC
  Administered 2019-08-04 – 2019-08-06 (×3): 40 mg via ORAL
  Filled 2019-08-04 (×3): qty 1

## 2019-08-04 MED ORDER — DEXAMETHASONE 4 MG PO TABS
20.0000 mg | ORAL_TABLET | Freq: Every day | ORAL | Status: DC
  Administered 2019-08-04 – 2019-08-06 (×3): 20 mg via ORAL
  Filled 2019-08-04 (×3): qty 5

## 2019-08-04 MED ORDER — ZOLPIDEM TARTRATE 5 MG PO TABS: 5.0000 mg | ORAL_TABLET | Freq: Every evening | ORAL | Status: DC | PRN

## 2019-08-04 MED ORDER — DEXAMETHASONE 4 MG PO TABS
20.0000 mg | ORAL_TABLET | Freq: Every day | ORAL | 0 refills | Status: DC
Start: 2019-08-04 — End: 2019-08-05

## 2019-08-04 MED ORDER — ROMIPLOSTIM INJECTION 500 MCG
3.0000 ug/kg | Freq: Once | SUBCUTANEOUS | Status: AC
  Administered 2019-08-04: 335 ug via SUBCUTANEOUS
  Filled 2019-08-04: qty 0.5

## 2019-08-04 NOTE — Telephone Encounter (Signed)
Via spanish interpretor Raynelle Fanning, Ms [name] states for the past 3 days she has had increased bruising, red spots, and her eyes feel sandy.  Lab appt made for this afternoon.  [name] was instructed to wait in the lobby until I talk to her.  I reviewed with Dr. Mosetta Putt.

## 2019-08-04 NOTE — Patient Instructions (Signed)
Romiplostim injection Qu es este medicamento? El ROMIPLOSTIM ayuda a que su cuerpo produzca ms plaquetas. Este medicamento se utiliza para tratar el nivel bajo de plaquetas causado por prpura trombocitopnica inmunitaria crnica (PTI). Este medicamento puede ser utilizado para otros usos; si tiene alguna pregunta consulte con su proveedor de atencin mdica o con su farmacutico. MARCAS COMUNES: Nplate Qu le debo informar a mi profesional de la salud antes de tomar este medicamento? Necesitan saber si usted presenta alguno de los siguientes problemas o situaciones: trastornos de sangrado problema de mdula sea, tales como cncer en la sangre o sndrome mielodisplsico antecedentes de cogulos sanguneos enfermedad heptica ciruga para extirpar el bazo una reaccin alrgica o inusual al romiplostim, al manitol, a otros medicamentos, alimentos, colorantes o conservantes si est embarazada o buscando quedar embarazada si est amamantando a un beb Cmo debo utilizar este medicamento? Este medicamento se administra mediante una inyeccin por va subcutnea. Lo administra un profesional de la salud en un hospital o en un entorno clnico. Le entregarn una Gua del medicamento especial (MedGuide, su nombre en ingls) antes de la inyeccin. Lea esta informacin cada vez cuidadosamente. Hable con su pediatra para informarse acerca del uso de este medicamento en nios. Aunque este medicamento se puede recetar a nios tan pequeos como de 1 ao de edad con ciertos padecimientos, existen precauciones que deben tomarse. Sobredosis: Pngase en contacto inmediatamente con un centro toxicolgico o una sala de urgencia si usted cree que haya tomado demasiado medicamento. ATENCIN: Este medicamento es solo para usted. No comparta este medicamento con nadie. Qu sucede si me olvido de una dosis? Es importante no olvidar ninguna dosis. Informe a su mdico o a su profesional de la salud si no puede asistir a  una cita. Qu puede interactuar con este medicamento? No se esperan interacciones. Puede ser que esta lista no menciona todas las posibles interacciones. Informe a su profesional de la salud de todos los productos a base de hierbas, medicamentos de venta libre o suplementos nutritivos que est tomando. Si usted fuma, consume bebidas alcohlicas o si utiliza drogas ilegales, indqueselo tambin a su profesional de la salud. Algunas sustancias pueden interactuar con su medicamento. A qu debo estar atento al usar este medicamento? Se supervisar su estado de salud atentamente mientras reciba este medicamento. Visite a su profesional que lo receta o a su profesional de la salud para chequear su evolucin peridicamente y para realizarse a los anlisis de sangre necesarios. Es importante asistir a todas sus citas. Qu efectos secundarios puedo tener al utilizar este medicamento? Efectos secundarios que debe informar a su mdico o a su profesional de la salud tan pronto como sea posible: reacciones alrgicas, como erupcin cutnea, comezn/picazn o urticaria, e hinchazn de la cara, los labios o la lengua signos y sntomas de sangrado, tales como heces con sangre o de color negro y aspecto alquitranado; orina de color rojo o marrn oscuro; escupir sangre o material marrn que tiene el aspecto de granos de caf molido; manchas rojas en la piel; sangrado o moretones inusuales en los ojos, las encas o la nariz signos y sntomas de un cogulo sanguneo, tales como dolor en el pecho; falta de aire; dolor, hinchazn o calor en la pierna signos y sntomas de un accidente cerebrovascular, tales como cambios en la visin; confusin; dificultad para hablar o entender; dolores de cabeza severos; entumecimiento o debilidad repentina de la cara, el brazo o la pierna; problemas al caminar; mareo; prdida del equilibrio o la coordinacin Efectos secundarios   que generalmente no requieren atencin mdica (infrmelos a su  mdico o a Producer, television/film/video de la salud si persisten o si son molestos): dolor de Public house manager en los brazos y las Programme researcher, broadcasting/film/video en la boca dolor estomacal Puede ser que esta lista no menciona todos los posibles efectos secundarios. Comunquese a su mdico por asesoramiento mdico [employer]. Usted puede informar los efectos secundarios a la FDA por telfono al 1-800-FDA-1088. Dnde debo guardar mi medicina? Este medicamento se administra en hospitales o clnicas y no necesitar guardarlo en su domicilio. ATENCIN: Este folleto es un resumen. Puede ser que no cubra toda la posible informacin. Si usted tiene preguntas acerca de esta medicina, consulte con su mdico, su farmacutico o su profesional de Radiographer, therapeutic.  2020 Elsevier/Gold Standard (2017-06-07 00:00:00)

## 2019-08-04 NOTE — H&P (Signed)
[facility]  Telephone:(336) [phone]   HEMATOLOGY ONCOLOGY INPATIENT ADMISSION NOTE   [name]  DOB: 12/14/1975  MR#: [id number]  CSN#: [id number]     Patient Care Team: Malachy Mood, MD as PCP - General (Hematology) Levert Feinstein, MD as Consulting Physician (Oncology) [name] (Hematology and Oncology) Elwin Mocha, MD as Consulting Physician (Ophthalmology) Justice Deeds, MD (Internal Medicine)   History of present illness:   [name] is a 44 y.o. female with history of chronic ITP, on Nplate, is being admitted from my office for severe thrombocytopenia with platelet less than 5k today.  She has history of recurrent ITP, status post splenectomy, previously had multiple hospital admission for acute ITP and treated with steroid, IVIG, Nplate, Rituxan, and chemo due to refractory disease.  She also had a history of small subarachnoid hemorrhage which did not require any intervention.  She has been on Nplate every 1 to 2 weeks since January 2021, last hospital admission for acute ITP was on 04/08/2019.  She came to my office last Wednesday for Nplate, and her CBC was normal.  She called me today for 3-day history of mild petechia, and bruises on her arms.  Her CBC today showed a platelet less than 5K, I give her 1 dose of Nplate in the office, and she is being admitted for further treatment and monitoring.  She denies any active bleeding, no epistaxis, gum bleeding, hematuria or hematochezia, no fever, productive cough, or recent infections.  She states her vision is slightly blurry, no double vision, no other neurological symptoms.  MEDICAL HISTORY:  Past Medical History:  Diagnosis Date  . Anemia   . Chronic ITP (idiopathic thrombocytopenia) (HCC)   . Depression   . Headache   . 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    SURGICAL HISTORY: Past Surgical History:  Procedure  Laterality Date  . BREAST SURGERY  biopsy  . 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 Marital status: Married    Spouse name: Not on file  . Number of children: Not on file  . Years of education: Not on file  . Highest education level: Not on file  Occupational History  . Occupation: packs personal products  Tobacco Use  . Smoking status: Never Smoker  . Smokeless tobacco: Never Used  Substance and Sexual Activity  . Alcohol use: No    Alcohol/week: 0.0 standard drinks  . Drug use: No  . Sexual activity: Not on file  Other Topics Concern  . Not on file  Social History Narrative  . Not on file   Social Determinants of Health   Financial Resource Strain:   . Difficulty of Paying Living Expenses:   Food Insecurity:   . Worried About Programme researcher, broadcasting/film/video in the Last Year:   . Barista in the Last Year:   Transportation Needs:   . Freight forwarder (Medical):   Marland Kitchen Lack of Transportation (Non-Medical):   Physical Activity:   . Days of Exercise per Week:   . Minutes of Exercise per Session:   Stress:   . Feeling of Stress :   Social Connections:   . Frequency of Communication with Friends and Family:   . Frequency of Social Gatherings  with Friends and Family:   . Attends Religious Services:   . Active Member of Clubs or Organizations:   . Attends Archivist Meetings:   Marland Kitchen Marital Status:   Intimate Partner Violence:   . Fear of Current or Ex-Partner:   . Emotionally Abused:   Marland Kitchen Physically Abused:   . Sexually Abused:     FAMILY HISTORY: Family History  Problem Relation Age of Onset  . Diabetes Mother   . Breast cancer Mother     ALLERGIES:  has No Known Allergies.  MEDICATIONS:  Current Facility-Administered Medications  Medication Dose Route Frequency Provider Last Rate  Last Admin  . dexamethasone (DECADRON) tablet 20 mg  20 mg Oral Daily Curcio, Roselie Awkward, NP      . Immune Globulin 10% (PRIVIGEN) IV infusion 80 g  80 g Intravenous Once Polly Cobia, RPH      . pantoprazole (PROTONIX) EC tablet 40 mg  40 mg Oral Daily Curcio, Roselie Awkward, NP        REVIEW OF SYSTEMS:   Constitutional: Denies fevers, chills or abnormal night sweats Eyes: (+) blurriness of vision, no double vision or watery eyes Ears, nose, mouth, throat, and face: Denies mucositis or sore throat Respiratory: Denies cough, dyspnea or wheezes Cardiovascular: Denies palpitation, chest discomfort or lower extremity swelling Gastrointestinal:  Denies nausea, heartburn or change in bowel habits Skin: Denies abnormal skin rashes Lymphatics: Denies new lymphadenopathy or easy bruising Neurological:Denies numbness, tingling or new weaknesses Behavioral/Psych: Mood is stable, no new changes  All other systems were reviewed with the patient and are negative.  PHYSICAL EXAMINATION: ECOG PERFORMANCE STATUS: 1 - Symptomatic but completely ambulatory  Vitals:   08/04/19 1739  Pulse: 63  Temp: 98 F (36.7 C)  SpO2: 98%   There were no vitals filed for this visit.  GENERAL:alert, no distress and comfortable, obese female  SKIN: skin color, texture, turgor are normal, no rashes or significant lesions, except mild petechia in her legs, and a few small bruises on her arms. EYES: normal, conjunctiva are pink and non-injected, sclera clear OROPHARYNX:no exudate, no erythema and lips, buccal mucosa, and tongue normal, (+) a few small bleeding spots in her mucosa NECK: supple, thyroid normal size, non-tender, without nodularity LYMPH:  no palpable lymphadenopathy in the cervical, axillary or inguinal LUNGS: clear to auscultation and percussion with normal breathing effort HEART: regular rate & rhythm and no murmurs and no lower extremity edema ABDOMEN:abdomen soft, non-tender and normal bowel sounds  Musculoskeletal:no cyanosis of digits and no clubbing  PSYCH: alert & oriented x 3 with fluent speech NEURO: no focal motor/sensory deficits  LABORATORY DATA:  I have reviewed the data as listed Lab Results  Component Value Date   WBC 12.8 (H) 08/04/2019   HGB 12.8 08/04/2019   HCT 39.0 08/04/2019   MCV 88.0 08/04/2019   PLT <5 (LL) 08/04/2019   Recent Labs    03/03/19 0240 03/04/19 0216 04/08/19 1650 04/08/19 1650 04/09/19 0110 04/10/19 0705 04/20/19 1949  NA 137   < > 138   < > 135 136 136  K 4.0   < > 3.7   < > 3.8 3.8 3.5  CL 104   < > 103   < > 104 106 100  CO2 21*   < > 26   < > 24 24 23   GLUCOSE 197*   < > 120*   < > 211* 143* 131*  BUN 12   < > 12   < >  14 14 7   CREATININE 0.69   < > 0.60   < > 0.65 0.66 0.70  CALCIUM 9.1   < > 9.0   < > 9.2 8.9 9.0  GFRNONAA >60   < > >60   < > >60 >60 >60  GFRAA >60   < > >60   < > >60 >60 >60  PROT 7.1  --  6.9  --  8.3*  --   --   ALBUMIN 3.9  --  3.6  --  3.7  --   --   AST 25  --  19  --  17  --   --   ALT 22  --  21  --  21  --   --   ALKPHOS 54  --  62  --  65  --   --   BILITOT 0.2*  --  0.4  --  0.2*  --   --    < > = values in this interval not displayed.    RADIOGRAPHIC STUDIES: I have personally reviewed the radiological images as listed and agreed with the findings in the report. No results found.  ASSESSMENT & PLAN: 44 yo 59 speaking female  1.  Recurrent and refractory ITP with acute flare  2.  Mild leukocytosis, likely reactive 3.  History of small foci of subarachnoid hemorrhage 4.  Mild blurry vision 5.  Covid infection in early February 2021  Plan  -Patient received Nplate42mcg/kg in my office today -Give 1 dose of IVIG 1mg /kg tonight -We will start dexamethasone 20 mg daily for 4 days -Close monitoring for bleeding -We will hold on platelet transfusion for now -Repeat lab in the morning -No pharmaceutical or mechanical DVT prophylaxis due to severe thrombocytopenia -I spoke with patient's  daughter, and her nurse 1m about care plan.   All questions were answered. The patient knows to call the clinic with any problems, questions or concerns.      , MD 08/04/2019 6:10 PM

## 2019-08-04 NOTE — Progress Notes (Signed)
Pt was seen in the office today, and will be admitted to [facility] for severe thrombocytopenia. See my admission note for details.  [name]  08/04/2019

## 2019-08-05 ENCOUNTER — Other Ambulatory Visit: Payer: Self-pay | Admitting: Oncology

## 2019-08-05 DIAGNOSIS — D693 Immune thrombocytopenic purpura: Secondary | ICD-10-CM

## 2019-08-05 LAB — CBC WITH DIFFERENTIAL/PLATELET
Abs Immature Granulocytes: 0.65 10*3/uL — ABNORMAL HIGH (ref 0.00–0.07)
Abs Immature Granulocytes: 0.96 10*3/uL — ABNORMAL HIGH (ref 0.00–0.07)
Basophils Absolute: 0.1 10*3/uL (ref 0.0–0.1)
Basophils Absolute: 0.1 10*3/uL (ref 0.0–0.1)
Basophils Relative: 0 %
Basophils Relative: 0 %
Eosinophils Absolute: 0 10*3/uL (ref 0.0–0.5)
Eosinophils Absolute: 0.1 10*3/uL (ref 0.0–0.5)
Eosinophils Relative: 0 %
Eosinophils Relative: 0 %
HCT: 39.1 % (ref 36.0–46.0)
HCT: 38.3 % (ref 36.0–46.0)
Hemoglobin: 12.7 g/dL (ref 12.0–15.0)
Hemoglobin: 12.4 g/dL (ref 12.0–15.0)
Immature Granulocytes: 4 %
Immature Granulocytes: 3 %
Lymphocytes Relative: 10 %
Lymphocytes Relative: 7 %
Lymphs Abs: 1.6 10*3/uL (ref 0.7–4.0)
Lymphs Abs: 2.1 10*3/uL (ref 0.7–4.0)
MCH: 29.1 pg (ref 26.0–34.0)
MCH: 28.7 pg (ref 26.0–34.0)
MCHC: 32.5 g/dL (ref 30.0–36.0)
MCHC: 32.4 g/dL (ref 30.0–36.0)
MCV: 89.7 fL (ref 80.0–100.0)
MCV: 88.7 fL (ref 80.0–100.0)
Monocytes Absolute: 0.1 10*3/uL (ref 0.1–1.0)
Monocytes Absolute: 0.3 10*3/uL (ref 0.1–1.0)
Monocytes Relative: 1 %
Monocytes Relative: 1 %
Neutro Abs: 13.6 10*3/uL — ABNORMAL HIGH (ref 1.7–7.7)
Neutro Abs: 28.9 10*3/uL — ABNORMAL HIGH (ref 1.7–7.7)
Neutrophils Relative %: 85 %
Neutrophils Relative %: 89 %
Platelets: 9 10*3/uL — CL (ref 150–400)
Platelets: 19 10*3/uL — CL (ref 150–400)
RBC: 4.36 MIL/uL (ref 3.87–5.11)
RBC: 4.32 MIL/uL (ref 3.87–5.11)
RDW: 13.9 % (ref 11.5–15.5)
RDW: 13.8 % (ref 11.5–15.5)
WBC: 15.9 10*3/uL — ABNORMAL HIGH (ref 4.0–10.5)
WBC: 32.5 10*3/uL — ABNORMAL HIGH (ref 4.0–10.5)
nRBC: 0.3 % — ABNORMAL HIGH (ref 0.0–0.2)
nRBC: 0.2 % (ref 0.0–0.2)

## 2019-08-05 LAB — COMPREHENSIVE METABOLIC PANEL
ALT: 19 U/L (ref 0–44)
AST: 19 U/L (ref 15–41)
Albumin: 3.4 g/dL — ABNORMAL LOW (ref 3.5–5.0)
Alkaline Phosphatase: 52 U/L (ref 38–126)
Anion gap: 8 (ref 5–15)
BUN: 16 mg/dL (ref 6–20)
CO2: 23 mmol/L (ref 22–32)
Calcium: 9.5 mg/dL (ref 8.9–10.3)
Chloride: 105 mmol/L (ref 98–111)
Creatinine, Ser: 0.63 mg/dL (ref 0.44–1.00)
GFR calc Af Amer: >60 mL/min (ref >60)
GFR calc non Af Amer: >60 mL/min (ref >60)
Glucose, Bld: 224 mg/dL — ABNORMAL HIGH (ref 70–99)
Potassium: 3.5 mmol/L (ref 3.5–5.1)
Sodium: 136 mmol/L (ref 135–145)
Total Bilirubin: 0.3 mg/dL (ref 0.3–1.2)
Total Protein: 8.8 g/dL — ABNORMAL HIGH (ref 6.5–8.1)

## 2019-08-05 MED ORDER — PANTOPRAZOLE SODIUM 40 MG PO TBEC: 40.0000 mg | DELAYED_RELEASE_TABLET | Freq: Every day | ORAL | 0 refills | Status: DC

## 2019-08-05 MED ORDER — ACETAMINOPHEN 325 MG PO TABS: 650.0000 mg | ORAL_TABLET | Freq: Four times a day (QID) | ORAL | Status: DC | PRN

## 2019-08-05 MED ORDER — DEXAMETHASONE 4 MG PO TABS
20.0000 mg | ORAL_TABLET | Freq: Every day | ORAL | 0 refills | Status: DC
Start: 2019-08-05 — End: 2019-08-06

## 2019-08-05 NOTE — Discharge Summary (Signed)
Discharge Summary  Patient ID: [name] MRN: [id number] DOB/AGE: [birth year] 44 y.o.  Admit date: 08/04/2019 Discharge date: 08/06/2019  Discharge Diagnoses:  Active Problems:   Acute ITP (HCC)   Discharged Condition: good  Discharge Labs:   CBC CBC Latest Ref Rng & Units 08/06/2019 08/05/2019 08/05/2019  WBC 4.0 - 10.5 K/uL 34.0(H) 32.5(H) 15.9(H)  Hemoglobin 12.0 - 15.0 g/dL 11.8(L) 12.4 12.7  Hematocrit 36.0 - 46.0 % 36.6 38.3 39.1  Platelets 150 - 400 K/uL 39(L) 19(LL) 9(LL)    CMP Latest Ref Rng & Units 08/05/2019 04/20/2019 04/10/2019  Glucose 70 - 99 mg/dL 542(H) 062(B) 762(G)  BUN 6 - 20 mg/dL 16 7 14   Creatinine 0.44 - 1.00 mg/dL 3.15 1.76  Sodium 135 - 145 mmol/L 136 136 136  Potassium 3.5 - 5.1 mmol/L 3.5 3.5 3.8  Chloride 98 - 111 mmol/L 105 100 106  CO2 22 - 32 mmol/L 23 23 24   Calcium 8.9 - 10.3 mg/dL 9.5 9.0 8.9  Total Protein 6.5 - 8.1 g/dL 1.60) - -  Total Bilirubin 0.3 - 1.2 mg/dL 0.3 - -  Alkaline Phos 38 - 126 U/L 52 - -  AST 15 - 41 U/L 19 - -  ALT 0 - 44 U/L 19 - -   Consults: None  Procedures: None  Disposition:  Discharge disposition: 01-Home or Self Care      Allergies as of 08/06/2019   No Known Allergies     Medication List    TAKE these medications   acetaminophen 500 MG tablet Commonly known as: TYLENOL Take 1-2 tablets (500-1,000 mg total) by mouth every 6 (six) hours as needed for mild pain or moderate pain. What changed:   how much to take  when to take this   dexamethasone 4 MG tablet Commonly known as: DECADRON Take 5 tablets (20 mg total) by mouth daily for 1 day.   pantoprazole 40 MG tablet Commonly known as: PROTONIX Take 1 tablet (40 mg total) by mouth daily.       HPI: Ms. 7.3(X is a 44 year old female who presented to our office for a 3-day history of increased bruising and red spots.  Labs in our office showed a platelet count of less than 5000.  She was given a dose of Nplate 3  mcg/kg in our office and admitted to the hospital for further management of her acute ITP.  She also had some mild headache and dizziness on the day of admission.  Hospital Course:  The patient was started on dexamethasone 20 mg p.o. daily and was given 1 dose of IVIG 1 mg/kg.  She was also started on Protonix 40 mg p.o. daily for GI prophylaxis.  The next morning, her platelet count had improved to 9000.  She was not having any active bleeding but was still having intermittent headaches and dizziness which were somewhat improved.  A recheck of her CBC on the afternoon of 08/05/2019 show that her platelet count had improved to 19,000.  She was offered to discharge on the afternoon of 08/05/2019 but preferred to stay due to transportation issues getting home that evening.  She was seen on the morning of 08/06/2019, plt improved to 39K, will be discharged home today with close f/u in my clinic.   The patient has been scheduled for an outpatient lab in our office on 08/07/2019 and will have repeat lab work, a visit, and Nplate injection on 08/11/2019.   Signed: 08/09/2019  08/06/2019

## 2019-08-05 NOTE — Progress Notes (Addendum)
HEMATOLOGY-ONCOLOGY PROGRESS NOTE  SUBJECTIVE: Reports dizziness this morning which is somewhat improved compared to yesterday.  Still has ongoing intermittent headaches which are similar to her prior headaches and somewhat improved.  Reports blurred vision which she has had for some time.  Has been seen by an eye doctor in the past but now has recently developed black spots and flashes of light intermittently.  She plans for another visit to the eye doctor in the near future.  She has no bleeding this morning.  Still has very faint petechiae.  She has been started on dexamethasone reports some mild GERD symptoms.  She is on a PPI.  Tolerated IVIG very well.  REVIEW OF SYSTEMS:   Constitutional: Denies fevers, chills  Eyes: Reports blurred vision and intermittent black spots and flashes of light Ears, nose, mouth, throat, and face: Denies mucositis or sore throat Respiratory: Denies cough, dyspnea or wheezes Cardiovascular: Denies palpitation, chest discomfort Gastrointestinal: Reports intermittent acid reflux symptoms Skin: Has ongoing faint petechiae on her upper arms and lower legs Neurological: Reports headaches and dizziness Behavioral/Psych: Mood is stable, no new changes  Extremities: No lower extremity edema All other systems were reviewed with the patient and are negative.  I have reviewed the past medical history, past surgical history, social history and family history with the patient and they are unchanged from previous note.   PHYSICAL EXAMINATION:  Vitals:   08/05/19 0125 08/05/19 0530  BP: 126/66 117/66  Pulse: 73 73  Resp: 18 20  Temp: 98.2 F (36.8 C) 97.8 F (36.6 C)  SpO2: 97% 98%   There were no vitals filed for this visit.  Intake/Output from previous day: 05/17 0701 - 05/18 0700 In: 1000 [I.V.:1000] Out: -   GENERAL:alert, no distress and comfortable SKIN: Very faint petechiae to her lower legs and upper arms EYES: normal, Conjunctiva are pink and  non-injected, sclera clear OROPHARYNX:no exudate, no erythema and lips, buccal mucosa, and tongue normal  NECK: supple, thyroid normal size, non-tender, without nodularity LYMPH:  no palpable lymphadenopathy in the cervical, axillary or inguinal LUNGS: clear to auscultation and percussion with normal breathing effort HEART: regular rate & rhythm and no murmurs and no lower extremity edema ABDOMEN:abdomen soft, non-tender and normal bowel sounds Musculoskeletal:no cyanosis of digits and no clubbing  NEURO: alert & oriented x 3 with fluent speech, no focal motor/sensory deficits  LABORATORY DATA:  I have reviewed the data as listed CMP Latest Ref Rng & Units 08/05/2019 04/20/2019 04/10/2019  Glucose 70 - 99 mg/dL 224(H) 131(H) 143(H)  BUN 6 - 20 mg/dL 16 7 14   Creatinine 0.44 - 1.00 mg/dL 0.63 0.70 0.66  Sodium 135 - 145 mmol/L 136 136 136  Potassium 3.5 - 5.1 mmol/L 3.5 3.5 3.8  Chloride 98 - 111 mmol/L 105 100 106  CO2 22 - 32 mmol/L 23 23 24   Calcium 8.9 - 10.3 mg/dL 9.5 9.0 8.9  Total Protein 6.5 - 8.1 g/dL 8.8(H) - -  Total Bilirubin 0.3 - 1.2 mg/dL 0.3 - -  Alkaline Phos 38 - 126 U/L 52 - -  AST 15 - 41 U/L 19 - -  ALT 0 - 44 U/L 19 - -    Lab Results  Component Value Date   WBC 15.9 (H) 08/05/2019   HGB 12.7 08/05/2019   HCT 39.1 08/05/2019   MCV 89.7 08/05/2019   PLT 9 (LL) 08/05/2019   NEUTROABS 13.6 (H) 08/05/2019    No results found.  ASSESSMENT AND PLAN: 1.  Recurrent and refractory ITP with acute flare 2.  Mild leukocytosis, likely reactive and due to dexamethasone 3.  History of small foci of subarachnoid hemorrhage 4.  Mild blurred vision 5.  Covid infection in early February 2021  -Labs from this morning have been reviewed.  Platelet count is 9000 today.  She has no active bleeding. -Continue dexamethasone 20 mg daily for a total of 4 days. -We will repeat CBC early this afternoon and if her platelet count is above 15,000, may consider her for discharge  later today.  If her platelet count is below 15,000, will administer another dose of IVIG. -Continue PPI for GI prophylaxis. -Continue to monitor closely for bleeding. -No platelet transfusion is indicated at this time. -Tylenol ordered as needed for headaches.    LOS: 1 day   Clenton Pare, DNP, AGPCNP-BC, AOCNP 08/05/19   Addendum  I have seen the patient, examined her. I agree with the assessment and and plan and have edited the notes.   Pt's plt is improving, stable mild dizziness and blurry vision, no other new symptoms, no bleeding. Will continue Dexa, and plan to discharge home tomorrow if plt continue to improve.  Malachy Mood  08/05/2019

## 2019-08-06 ENCOUNTER — Other Ambulatory Visit: Payer: Self-pay | Admitting: Hematology

## 2019-08-06 LAB — CBC WITH DIFFERENTIAL/PLATELET
Abs Immature Granulocytes: 0.84 10*3/uL — ABNORMAL HIGH (ref 0.00–0.07)
Basophils Absolute: 0.1 10*3/uL (ref 0.0–0.1)
Basophils Relative: 0 %
Eosinophils Absolute: 0 10*3/uL (ref 0.0–0.5)
Eosinophils Relative: 0 %
HCT: 36.6 % (ref 36.0–46.0)
Hemoglobin: 11.8 g/dL — ABNORMAL LOW (ref 12.0–15.0)
Immature Granulocytes: 3 %
Lymphocytes Relative: 7 %
Lymphs Abs: 2.3 10*3/uL (ref 0.7–4.0)
MCH: 28.6 pg (ref 26.0–34.0)
MCHC: 32.2 g/dL (ref 30.0–36.0)
MCV: 88.8 fL (ref 80.0–100.0)
Monocytes Absolute: 1.6 10*3/uL — ABNORMAL HIGH (ref 0.1–1.0)
Monocytes Relative: 5 %
Neutro Abs: 29.1 10*3/uL — ABNORMAL HIGH (ref 1.7–7.7)
Neutrophils Relative %: 85 %
Platelets: 39 10*3/uL — ABNORMAL LOW (ref 150–400)
RBC: 4.12 MIL/uL (ref 3.87–5.11)
RDW: 14.3 % (ref 11.5–15.5)
WBC: 34 10*3/uL — ABNORMAL HIGH (ref 4.0–10.5)
nRBC: 0.1 % (ref 0.0–0.2)

## 2019-08-06 MED ORDER — DEXAMETHASONE 4 MG PO TABS: 20.0000 mg | ORAL_TABLET | Freq: Every day | ORAL | 0 refills | Status: AC

## 2019-08-07 ENCOUNTER — Inpatient Hospital Stay: Payer: Self-pay

## 2019-08-07 ENCOUNTER — Other Ambulatory Visit: Payer: Self-pay

## 2019-08-07 DIAGNOSIS — D693 Immune thrombocytopenic purpura: Secondary | ICD-10-CM

## 2019-08-07 LAB — CBC WITH DIFFERENTIAL (CANCER CENTER ONLY)
Abs Immature Granulocytes: 0.62 10*3/uL — ABNORMAL HIGH (ref 0.00–0.07)
Basophils Absolute: 0.1 10*3/uL (ref 0.0–0.1)
Basophils Relative: 0 %
Eosinophils Absolute: 0 10*3/uL (ref 0.0–0.5)
Eosinophils Relative: 0 %
HCT: 36.5 % (ref 36.0–46.0)
Hemoglobin: 12.1 g/dL (ref 12.0–15.0)
Immature Granulocytes: 2 %
Lymphocytes Relative: 14 %
Lymphs Abs: 3.7 10*3/uL (ref 0.7–4.0)
MCH: 28.5 pg (ref 26.0–34.0)
MCHC: 33.2 g/dL (ref 30.0–36.0)
MCV: 86.1 fL (ref 80.0–100.0)
Monocytes Absolute: 1.7 10*3/uL — ABNORMAL HIGH (ref 0.1–1.0)
Monocytes Relative: 6 %
Neutro Abs: 21.4 10*3/uL — ABNORMAL HIGH (ref 1.7–7.7)
Neutrophils Relative %: 78 %
Platelet Count: 88 10*3/uL — ABNORMAL LOW (ref 150–400)
RBC: 4.24 MIL/uL (ref 3.87–5.11)
RDW: 14.6 % (ref 11.5–15.5)
WBC Count: 27.5 10*3/uL — ABNORMAL HIGH (ref 4.0–10.5)
nRBC: 0.1 % (ref 0.0–0.2)

## 2019-08-08 NOTE — Progress Notes (Signed)
[facility]   Telephone:(336) [phone] Fax:(336) [phone]   Clinic Follow up Note   Patient Care Team: Alice Mood, MD as PCP - General (Hematology) Alice Feinstein, MD as Consulting Physician (Oncology) [name] (Hematology and Oncology) Alice Mocha, MD as Consulting Physician (Ophthalmology) Alice Deeds, MD (Internal Medicine)  Date of Service:  08/11/2019  CHIEF COMPLAINT: F/u of ITP  CURRENT THERAPY:  -Promacta 25mg  daily starting 10/22/18. Stopped 04/08/18 due to recurrent flares.  -Nplateq2weeks starting in 2 weeks. Increased to weekly on 08/11/19 due to recurrent ITP flares. Will give her 2 mcg/kg if platelet normal, and increase to 3 mcg/kg if platelet less than 150K    INTERVAL HISTORY:  [name] is here for a follow up of ITP. She presents to the clinic with her [name]. She notes she feels dizzy and seeing lights in her eyes standing or sitting. She also notes being fatigued. She had this last week when in the hospital. She notes she is drinking water little by little. She notes she is eating well. She notes adequate hearing and denies ringing in her ears.    REVIEW OF SYSTEMS:   Constitutional: Denies fevers, chills or abnormal weight loss (+) Dizziness (+) Fatigue  Eyes: Denies blurriness of vision Ears, nose, mouth, throat, and face: Denies mucositis or sore throat Respiratory: Denies cough, dyspnea or wheezes Cardiovascular: Denies palpitation, chest discomfort or lower extremity swelling Gastrointestinal:  Denies nausea, heartburn or change in bowel habits Skin: Denies abnormal skin rashes Lymphatics: Denies new lymphadenopathy or easy bruising Neurological:Denies numbness, tingling or new weaknesses Behavioral/Psych: Holland is stable, no new changes  All other systems were reviewed with the patient and are negative.  MEDICAL HISTORY:  Past Medical History:  Diagnosis Date  . Anemia   . Chronic ITP  (idiopathic thrombocytopenia) (HCC)   . Depression   . Headache   . 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 BREAST SURGERY  biopsy  . 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  ALLERGIES:  has No Known Allergies.  MEDICATIONS:  Current Outpatient Medications  Medication Sig Dispense Refill  . acetaminophen (TYLENOL) 500 MG tablet Take 1-2 tablets (500-1,000 mg total) by mouth every 6 (six) hours as needed for mild pain or moderate pain. (Patient taking differently: Take 500 mg by mouth daily as needed for mild pain or moderate pain. ) 30 tablet 0  . meclizine (ANTIVERT) 25 MG tablet Take 1 tablet (25 mg total) by mouth 3 (three) times daily as needed for dizziness. 30 tablet 1  . pantoprazole (PROTONIX) 40 MG tablet Take 1 tablet (40 mg total) by mouth daily. 30 tablet 0   No current facility-administered medications for this visit.    PHYSICAL EXAMINATION: ECOG PERFORMANCE STATUS: 2 - Symptomatic, <50% confined to bed  Vitals:   08/11/19 1349  BP: 93/62  Pulse: 97  Resp: 18  Temp: 99 F (37.2 C)  SpO2: 99%   Filed Weights   08/11/19 1349  Weight: 245 lb 4.8 oz (111.3 kg)    Due to COVID19 we will limit examination to appearance. Patient had no complaints.  GENERAL:alert, no distress and comfortable SKIN: skin color normal, no rashes or significant lesions EYES: normal, Conjunctiva are pink and non-injected, sclera clear  NEURO: alert & oriented x 3 with fluent speech    LABORATORY DATA:  I have reviewed the data as listed CBC Latest Ref Rng & Units 08/11/2019 08/07/2019 08/06/2019  WBC 4.0 - 10.5 K/uL 18.9(H) 27.5(H) 34.0(H)    Hemoglobin 12.0 - 15.0 g/dL 14.5 12.1 11.8(L)  Hematocrit 36.0 - 46.0 % 44.3 36.5 36.6  Platelets 150 - 400 K/uL 196 88(L) 39(L)     CMP Latest Ref Rng & Units 08/11/2019 08/05/2019 04/20/2019  Glucose 70 - 99 mg/dL 125(H) 224(H) 131(H)  BUN 6 - 20 mg/dL 20 16 7   Creatinine 0.44 - 1.00 mg/dL 0.84 0.63 0.70  Sodium 135 - 145 mmol/L 137 136 136  Potassium 3.5 - 5.1 mmol/L 3.8 3.5 3.5  Chloride 98 - 111 mmol/L 104 105 100  CO2 22 - 32 mmol/L 26 23 23   Calcium 8.9 - 10.3 mg/dL 8.6(L) 9.5 9.0  Total Protein 6.5 - 8.1 g/dL 7.5 8.8(H) -  Total Bilirubin 0.3 - 1.2 mg/dL 0.2(L) 0.3 -  Alkaline Phos 38 - 126 U/L 78 52 -  AST 15 - 41 U/L 11(L) 19 -  ALT 0 - 44 U/L 14 19 -      RADIOGRAPHIC STUDIES: I have personally reviewed the radiological images as listed and agreed with the findings in the report. No results found.   ASSESSMENT & PLAN:  [name] is a 44 y.o. female with    1. 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 -She has had multiple hospitalization for acute ITP flare, last admission was last week for plt<5, she was treated with IVIG, short course Dexa, and high dose Nplate. Plt improved to 88K by 08/07/19.  -We will continue Nplate, and change injection to weekly, will maintain on dose 29mcg/kg, and increase to 3mcg/kg if plt<150K  -She has increased dizziness since her most recent hospital discharge. Labs reviewed, CBC and CMP WNL except WBC 18.9, plt 196K.  -Proceed with Nplate injection today and increase to weekly. Goal is to prevent hospitalization due to ITP flares.  -F/u in 8 weeks    2.History of small foci ofsubarachnoid  hemorrhage -Secondary to #1 -recovered well overall.Will continue to monitor.  3.HA's  -She has been having intermittent HAs since 03/2019 hospitalization. Will monitor.  4. COVID19 (+) in early 04/2019 -She notes she had mild to moderate breathing issues. No fever. She was treated monoclonial antibodies.  -She has overall recovered well. -She is fine to proceed with COVID19 vaccine 3 months after her tested positive.  5. Blurry Vision -For the past 3 weeks she has had blurred vision. She only had 1 episode of dizziness, no HA.  -I recommend she f/u with her Ophthalmologist.   6. Chronic Dizziness  -She notes since her latest hospital discharge she has increased dizziness standing or sitting along with seeing lights in her eyes.  -She notes she has had this in the past 4 years(since ITP Dx) with lower platelets then improves with treatment, but no improvement this time.  -I suggest she use oral Meclizine (08/11/19). I will also refer her to Dr Mickeal Skinner for further eval.  7. Financial Support  -She pays out of pocket but submits her bills and receives pay assistance.   PLAN: -I called in Meclizine today  -Refer to Dr Barbaraann Cao for her chronic dizziness, vision change and headache -Proceed with Nplate injection today at 66mcg/kg  -Lab and Nplate injection weekly X8 -F/u in 8 weeks   No problem-specific Assessment & Plan notes found for this encounter.   No orders of the defined types were placed in this encounter.  All questions were answered. The patient knows to call the clinic with any problems, questions or concerns. No barriers to learning was detected. The total time spent in the appointment was 30 minutes.     Alice Mood, MD 08/11/2019   I, Delphina Cahill, am acting as scribe for Alice Mood, MD.   I have reviewed the above documentation for accuracy and completeness, and I agree with the above.

## 2019-08-11 ENCOUNTER — Other Ambulatory Visit: Payer: Self-pay

## 2019-08-11 ENCOUNTER — Inpatient Hospital Stay: Payer: Self-pay

## 2019-08-11 ENCOUNTER — Inpatient Hospital Stay (HOSPITAL_BASED_OUTPATIENT_CLINIC_OR_DEPARTMENT_OTHER): Payer: Self-pay | Admitting: Hematology

## 2019-08-11 ENCOUNTER — Ambulatory Visit: Payer: Self-pay | Admitting: Hematology

## 2019-08-11 ENCOUNTER — Encounter: Payer: Self-pay | Admitting: Hematology

## 2019-08-11 VITALS — BP 93/62 | HR 97 | Temp 99.0°F | Resp 18 | Ht 59.0 in | Wt 245.3 lb

## 2019-08-11 DIAGNOSIS — D693 Immune thrombocytopenic purpura: Secondary | ICD-10-CM

## 2019-08-11 LAB — CBC WITH DIFFERENTIAL (CANCER CENTER ONLY)
Abs Immature Granulocytes: 0.82 10*3/uL — ABNORMAL HIGH (ref 0.00–0.07)
Basophils Absolute: 0.1 10*3/uL (ref 0.0–0.1)
Basophils Relative: 0 %
Eosinophils Absolute: 0.1 10*3/uL (ref 0.0–0.5)
Eosinophils Relative: 0 %
HCT: 44.3 % (ref 36.0–46.0)
Hemoglobin: 14.5 g/dL (ref 12.0–15.0)
Immature Granulocytes: 4 %
Lymphocytes Relative: 35 %
Lymphs Abs: 6.6 10*3/uL — ABNORMAL HIGH (ref 0.7–4.0)
MCH: 28.7 pg (ref 26.0–34.0)
MCHC: 32.7 g/dL (ref 30.0–36.0)
MCV: 87.7 fL (ref 80.0–100.0)
Monocytes Absolute: 1.5 10*3/uL — ABNORMAL HIGH (ref 0.1–1.0)
Monocytes Relative: 8 %
Neutro Abs: 9.9 10*3/uL — ABNORMAL HIGH (ref 1.7–7.7)
Neutrophils Relative %: 53 %
Platelet Count: 196 10*3/uL (ref 150–400)
RBC: 5.05 MIL/uL (ref 3.87–5.11)
RDW: 14.3 % (ref 11.5–15.5)
WBC Count: 18.9 10*3/uL — ABNORMAL HIGH (ref 4.0–10.5)
nRBC: 0.4 % — ABNORMAL HIGH (ref 0.0–0.2)

## 2019-08-11 LAB — CMP (CANCER CENTER ONLY)
ALT: 14 U/L (ref 0–44)
AST: 11 U/L — ABNORMAL LOW (ref 15–41)
Albumin: 3.1 g/dL — ABNORMAL LOW (ref 3.5–5.0)
Alkaline Phosphatase: 78 U/L (ref 38–126)
Anion gap: 7 (ref 5–15)
BUN: 20 mg/dL (ref 6–20)
CO2: 26 mmol/L (ref 22–32)
Calcium: 8.6 mg/dL — ABNORMAL LOW (ref 8.9–10.3)
Chloride: 104 mmol/L (ref 98–111)
Creatinine: 0.84 mg/dL (ref 0.44–1.00)
GFR, Est AFR Am: >60 mL/min (ref >60)
GFR, Estimated: >60 mL/min (ref >60)
Glucose, Bld: 125 mg/dL — ABNORMAL HIGH (ref 70–99)
Potassium: 3.8 mmol/L (ref 3.5–5.1)
Sodium: 137 mmol/L (ref 135–145)
Total Bilirubin: 0.2 mg/dL — ABNORMAL LOW (ref 0.3–1.2)
Total Protein: 7.5 g/dL (ref 6.5–8.1)

## 2019-08-11 MED ORDER — ROMIPLOSTIM 250 MCG ~~LOC~~ SOLR
2.0000 ug/kg | Freq: Once | SUBCUTANEOUS | Status: AC
  Administered 2019-08-11: 225 ug via SUBCUTANEOUS
  Filled 2019-08-11: qty 0.45

## 2019-08-11 MED ORDER — MECLIZINE HCL 25 MG PO TABS: 25.0000 mg | ORAL_TABLET | Freq: Three times a day (TID) | ORAL | 1 refills | Status: DC | PRN

## 2019-08-11 NOTE — Patient Instructions (Signed)
Romiplostim injection What is this medicine? ROMIPLOSTIM (roe mi PLOE stim) helps your body make more platelets. This medicine is used to treat low platelets caused by chronic idiopathic thrombocytopenic purpura (ITP). This medicine may be used for other purposes; ask your health care provider or pharmacist if you have questions. COMMON BRAND NAME(S): Nplate What should I tell my health care provider before I take this medicine? They need to know if you have any of these conditions:  bleeding disorders  bone marrow problem, like blood cancer or myelodysplastic syndrome  history of blood clots  liver disease  surgery to remove your spleen  an unusual or allergic reaction to romiplostim, mannitol, other medicines, foods, dyes, or preservatives  pregnant or trying to get pregnant  breast-feeding How should I use this medicine? This medicine is for injection under the skin. It is given by a health care professional in a hospital or clinic setting. A special MedGuide will be given to you before your injection. Read this information carefully each time. Talk to your pediatrician regarding the use of this medicine in children. While this drug may be prescribed for children as young as 1 year for selected conditions, precautions do apply. Overdosage: If you think you have taken too much of this medicine contact a poison control center or emergency room at once. NOTE: This medicine is only for you. Do not share this medicine with others. What if I miss a dose? It is important not to miss your dose. Call your doctor or health care professional if you are unable to keep an appointment. What may interact with this medicine? Interactions are not expected. This list may not describe all possible interactions. Give your health care provider a list of all the medicines, herbs, non-prescription drugs, or dietary supplements you use. Also tell them if you smoke, drink alcohol, or use illegal drugs.  Some items may interact with your medicine. What should I watch for while using this medicine? Your condition will be monitored carefully while you are receiving this medicine. Visit your prescriber or health care professional for regular checks on your progress and for the needed blood tests. It is important to keep all appointments. What side effects may I notice from receiving this medicine? Side effects that you should report to your doctor or health care professional as soon as possible:  allergic reactions like skin rash, itching or hives, swelling of the face, lips, or tongue  signs and symptoms of bleeding such as bloody or black, tarry stools; red or dark brown urine; spitting up blood or brown material that looks like coffee grounds; red spots on the skin; unusual bruising or bleeding from the eyes, gums, or nose  signs and symptoms of a blood clot such as chest pain; shortness of breath; pain, swelling, or warmth in the leg  signs and symptoms of a stroke like changes in vision; confusion; trouble speaking or understanding; severe headaches; sudden numbness or weakness of the face, arm or leg; trouble walking; dizziness; loss of balance or coordination Side effects that usually do not require medical attention (report to your doctor or health care professional if they continue or are bothersome):  headache  pain in arms and legs  pain in mouth  stomach pain This list may not describe all possible side effects. Call your doctor for medical advice about side effects. You may report side effects to FDA at 1-800-FDA-1088. Where should I keep my medicine? This drug is given in a hospital or clinic   and will not be stored at home. NOTE: This sheet is a summary. It may not cover all possible information. If you have questions about this medicine, talk to your doctor, pharmacist, or health care provider.  2020 Elsevier/Gold Standard (2017-03-05 11:10:55)  

## 2019-08-13 ENCOUNTER — Telehealth: Payer: Self-pay | Admitting: Internal Medicine

## 2019-08-13 ENCOUNTER — Inpatient Hospital Stay: Payer: Self-pay

## 2019-08-13 ENCOUNTER — Telehealth: Payer: Self-pay | Admitting: Hematology

## 2019-08-13 ENCOUNTER — Encounter: Payer: Self-pay | Admitting: Internal Medicine

## 2019-08-13 NOTE — Telephone Encounter (Signed)
Scheduled appt per 5/24 los.  Left a vm of the appt date and time. 

## 2019-08-13 NOTE — Telephone Encounter (Signed)
Received a new pt referral from Dr. Mosetta Putt for [name] to see Dr. Barbaraann Cao. An appt has been scheduled for the pt to see Dr. Barbaraann Cao on 6/10 at 10am. I cld and lft the appt date and time on the pt's voicemail. Letter mailed.

## 2019-08-20 ENCOUNTER — Other Ambulatory Visit: Payer: Self-pay

## 2019-08-20 ENCOUNTER — Inpatient Hospital Stay: Payer: Self-pay

## 2019-08-20 ENCOUNTER — Inpatient Hospital Stay: Payer: Self-pay | Attending: Nurse Practitioner

## 2019-08-20 ENCOUNTER — Ambulatory Visit (HOSPITAL_BASED_OUTPATIENT_CLINIC_OR_DEPARTMENT_OTHER): Payer: Self-pay | Admitting: Medical

## 2019-08-20 VITALS — BP 116/68 | HR 74

## 2019-08-20 VITALS — HR 75 | Temp 98.2°F | Resp 20

## 2019-08-20 DIAGNOSIS — D693 Immune thrombocytopenic purpura: Secondary | ICD-10-CM | POA: Insufficient documentation

## 2019-08-20 DIAGNOSIS — Z7952 Long term (current) use of systemic steroids: Secondary | ICD-10-CM | POA: Insufficient documentation

## 2019-08-20 DIAGNOSIS — Z79899 Other long term (current) drug therapy: Secondary | ICD-10-CM | POA: Insufficient documentation

## 2019-08-20 DIAGNOSIS — I950 Idiopathic hypotension: Secondary | ICD-10-CM

## 2019-08-20 DIAGNOSIS — Z8616 Personal history of COVID-19: Secondary | ICD-10-CM | POA: Insufficient documentation

## 2019-08-20 DIAGNOSIS — I959 Hypotension, unspecified: Secondary | ICD-10-CM | POA: Insufficient documentation

## 2019-08-20 DIAGNOSIS — G4489 Other headache syndrome: Secondary | ICD-10-CM | POA: Insufficient documentation

## 2019-08-20 DIAGNOSIS — H538 Other visual disturbances: Secondary | ICD-10-CM | POA: Insufficient documentation

## 2019-08-20 DIAGNOSIS — R42 Dizziness and giddiness: Secondary | ICD-10-CM | POA: Insufficient documentation

## 2019-08-20 LAB — CBC WITH DIFFERENTIAL (CANCER CENTER ONLY)
Abs Immature Granulocytes: 0.18 10*3/uL — ABNORMAL HIGH (ref 0.00–0.07)
Basophils Absolute: 0 10*3/uL (ref 0.0–0.1)
Basophils Relative: 0 %
Eosinophils Absolute: 0.1 10*3/uL (ref 0.0–0.5)
Eosinophils Relative: 1 %
HCT: 39.2 % (ref 36.0–46.0)
Hemoglobin: 12.7 g/dL (ref 12.0–15.0)
Immature Granulocytes: 2 %
Lymphocytes Relative: 25 %
Lymphs Abs: 2.5 10*3/uL (ref 0.7–4.0)
MCH: 29.1 pg (ref 26.0–34.0)
MCHC: 32.4 g/dL (ref 30.0–36.0)
MCV: 89.7 fL (ref 80.0–100.0)
Monocytes Absolute: 0.9 10*3/uL (ref 0.1–1.0)
Monocytes Relative: 9 %
Neutro Abs: 6.3 10*3/uL (ref 1.7–7.7)
Neutrophils Relative %: 63 %
Platelet Count: 175 10*3/uL (ref 150–400)
RBC: 4.37 MIL/uL (ref 3.87–5.11)
RDW: 14.1 % (ref 11.5–15.5)
WBC Count: 10.1 10*3/uL (ref 4.0–10.5)
nRBC: 0.3 % — ABNORMAL HIGH (ref 0.0–0.2)

## 2019-08-20 MED ORDER — ROMIPLOSTIM 250 MCG ~~LOC~~ SOLR
2.0000 ug/kg | Freq: Once | SUBCUTANEOUS | Status: AC
  Administered 2019-08-20: 225 ug via SUBCUTANEOUS
  Filled 2019-08-20: qty 0.45

## 2019-08-20 MED ORDER — SODIUM CHLORIDE 0.9 % IV SOLN
INTRAVENOUS | Status: AC
  Filled 2019-08-20 (×2): qty 250

## 2019-08-20 NOTE — Patient Instructions (Signed)
Romiplostim injection What is this medicine? ROMIPLOSTIM (roe mi PLOE stim) helps your body make more platelets. This medicine is used to treat low platelets caused by chronic idiopathic thrombocytopenic purpura (ITP). This medicine may be used for other purposes; ask your health care provider or pharmacist if you have questions. COMMON BRAND NAME(S): Nplate What should I tell my health care provider before I take this medicine? They need to know if you have any of these conditions:  bleeding disorders  bone marrow problem, like blood cancer or myelodysplastic syndrome  history of blood clots  liver disease  surgery to remove your spleen  an unusual or allergic reaction to romiplostim, mannitol, other medicines, foods, dyes, or preservatives  pregnant or trying to get pregnant  breast-feeding How should I use this medicine? This medicine is for injection under the skin. It is given by a health care professional in a hospital or clinic setting. A special MedGuide will be given to you before your injection. Read this information carefully each time. Talk to your pediatrician regarding the use of this medicine in children. While this drug may be prescribed for children as young as 1 year for selected conditions, precautions do apply. Overdosage: If you think you have taken too much of this medicine contact a poison control center or emergency room at once. NOTE: This medicine is only for you. Do not share this medicine with others. What if I miss a dose? It is important not to miss your dose. Call your doctor or health care professional if you are unable to keep an appointment. What may interact with this medicine? Interactions are not expected. This list may not describe all possible interactions. Give your health care provider a list of all the medicines, herbs, non-prescription drugs, or dietary supplements you use. Also tell them if you smoke, drink alcohol, or use illegal drugs.  Some items may interact with your medicine. What should I watch for while using this medicine? Your condition will be monitored carefully while you are receiving this medicine. Visit your prescriber or health care professional for regular checks on your progress and for the needed blood tests. It is important to keep all appointments. What side effects may I notice from receiving this medicine? Side effects that you should report to your doctor or health care professional as soon as possible:  allergic reactions like skin rash, itching or hives, swelling of the face, lips, or tongue  signs and symptoms of bleeding such as bloody or black, tarry stools; red or dark brown urine; spitting up blood or brown material that looks like coffee grounds; red spots on the skin; unusual bruising or bleeding from the eyes, gums, or nose  signs and symptoms of a blood clot such as chest pain; shortness of breath; pain, swelling, or warmth in the leg  signs and symptoms of a stroke like changes in vision; confusion; trouble speaking or understanding; severe headaches; sudden numbness or weakness of the face, arm or leg; trouble walking; dizziness; loss of balance or coordination Side effects that usually do not require medical attention (report to your doctor or health care professional if they continue or are bothersome):  headache  pain in arms and legs  pain in mouth  stomach pain This list may not describe all possible side effects. Call your doctor for medical advice about side effects. You may report side effects to FDA at 1-800-FDA-1088. Where should I keep my medicine? This drug is given in a hospital or clinic   and will not be stored at home. NOTE: This sheet is a summary. It may not cover all possible information. If you have questions about this medicine, talk to your doctor, pharmacist, or health care provider.  2020 Elsevier/Gold Standard (2017-03-05 11:10:55)  

## 2019-08-20 NOTE — Progress Notes (Signed)
This patient was seen by Dr. Mosetta Putt.

## 2019-08-20 NOTE — Progress Notes (Signed)
Per dr. Mosetta Putt ok to give nplate with low BP

## 2019-08-27 ENCOUNTER — Other Ambulatory Visit: Payer: Self-pay

## 2019-08-27 ENCOUNTER — Other Ambulatory Visit: Payer: Self-pay | Admitting: Hematology

## 2019-08-27 ENCOUNTER — Telehealth: Payer: Self-pay

## 2019-08-27 ENCOUNTER — Inpatient Hospital Stay: Payer: Self-pay

## 2019-08-27 ENCOUNTER — Inpatient Hospital Stay: Payer: Self-pay | Admitting: Pharmacist

## 2019-08-27 ENCOUNTER — Encounter: Payer: Self-pay | Admitting: Hematology

## 2019-08-27 ENCOUNTER — Ambulatory Visit: Payer: Self-pay | Admitting: Hematology

## 2019-08-27 VITALS — BP 91/42 | Temp 98.2°F | Resp 19

## 2019-08-27 DIAGNOSIS — D693 Immune thrombocytopenic purpura: Secondary | ICD-10-CM

## 2019-08-27 LAB — CBC WITH DIFFERENTIAL (CANCER CENTER ONLY)
Abs Immature Granulocytes: 0.04 10*3/uL (ref 0.00–0.07)
Basophils Absolute: 0 10*3/uL (ref 0.0–0.1)
Basophils Relative: 1 %
Eosinophils Absolute: 0.2 10*3/uL (ref 0.0–0.5)
Eosinophils Relative: 2 %
HCT: 37.1 % (ref 36.0–46.0)
Hemoglobin: 12.2 g/dL (ref 12.0–15.0)
Immature Granulocytes: 1 %
Lymphocytes Relative: 27 %
Lymphs Abs: 2.2 10*3/uL (ref 0.7–4.0)
MCH: 28.4 pg (ref 26.0–34.0)
MCHC: 32.9 g/dL (ref 30.0–36.0)
MCV: 86.5 fL (ref 80.0–100.0)
Monocytes Absolute: 0.6 10*3/uL (ref 0.1–1.0)
Monocytes Relative: 7 %
Neutro Abs: 5 10*3/uL (ref 1.7–7.7)
Neutrophils Relative %: 62 %
Platelet Count: 10 10*3/uL — ABNORMAL LOW (ref 150–400)
RBC: 4.29 MIL/uL (ref 3.87–5.11)
RDW: 14.1 % (ref 11.5–15.5)
WBC Count: 8 10*3/uL (ref 4.0–10.5)
nRBC: 0 % (ref 0.0–0.2)

## 2019-08-27 MED ORDER — ROMIPLOSTIM INJECTION 500 MCG
335.0000 ug | Freq: Once | SUBCUTANEOUS | Status: AC
  Administered 2019-08-27: 335 ug via SUBCUTANEOUS
  Filled 2019-08-27: qty 0.25

## 2019-08-27 MED ORDER — ROMIPLOSTIM 125 MCG ~~LOC~~ SOLR: 2.0000 ug/kg | Freq: Once | SUBCUTANEOUS | Status: DC

## 2019-08-27 MED ORDER — DEXAMETHASONE 4 MG PO TABS
20.0000 mg | ORAL_TABLET | Freq: Every day | ORAL | 0 refills | Status: DC
Start: 2019-08-27 — End: 2019-09-24

## 2019-08-27 NOTE — Telephone Encounter (Signed)
Critical Value: Platelets 10,000 Dr. Mosetta Putt notified

## 2019-08-27 NOTE — Patient Instructions (Signed)
Romiplostim injection What is this medicine? ROMIPLOSTIM (roe mi PLOE stim) helps your body make more platelets. This medicine is used to treat low platelets caused by chronic idiopathic thrombocytopenic purpura (ITP). This medicine may be used for other purposes; ask your health care provider or pharmacist if you have questions. COMMON BRAND NAME(S): Nplate What should I tell my health care provider before I take this medicine? They need to know if you have any of these conditions:  bleeding disorders  bone marrow problem, like blood cancer or myelodysplastic syndrome  history of blood clots  liver disease  surgery to remove your spleen  an unusual or allergic reaction to romiplostim, mannitol, other medicines, foods, dyes, or preservatives  pregnant or trying to get pregnant  breast-feeding How should I use this medicine? This medicine is for injection under the skin. It is given by a health care professional in a hospital or clinic setting. A special MedGuide will be given to you before your injection. Read this information carefully each time. Talk to your pediatrician regarding the use of this medicine in children. While this drug may be prescribed for children as young as 1 year for selected conditions, precautions do apply. Overdosage: If you think you have taken too much of this medicine contact a poison control center or emergency room at once. NOTE: This medicine is only for you. Do not share this medicine with others. What if I miss a dose? It is important not to miss your dose. Call your doctor or health care professional if you are unable to keep an appointment. What may interact with this medicine? Interactions are not expected. This list may not describe all possible interactions. Give your health care provider a list of all the medicines, herbs, non-prescription drugs, or dietary supplements you use. Also tell them if you smoke, drink alcohol, or use illegal drugs.  Some items may interact with your medicine. What should I watch for while using this medicine? Your condition will be monitored carefully while you are receiving this medicine. Visit your prescriber or health care professional for regular checks on your progress and for the needed blood tests. It is important to keep all appointments. What side effects may I notice from receiving this medicine? Side effects that you should report to your doctor or health care professional as soon as possible:  allergic reactions like skin rash, itching or hives, swelling of the face, lips, or tongue  signs and symptoms of bleeding such as bloody or black, tarry stools; red or dark brown urine; spitting up blood or brown material that looks like coffee grounds; red spots on the skin; unusual bruising or bleeding from the eyes, gums, or nose  signs and symptoms of a blood clot such as chest pain; shortness of breath; pain, swelling, or warmth in the leg  signs and symptoms of a stroke like changes in vision; confusion; trouble speaking or understanding; severe headaches; sudden numbness or weakness of the face, arm or leg; trouble walking; dizziness; loss of balance or coordination Side effects that usually do not require medical attention (report to your doctor or health care professional if they continue or are bothersome):  headache  pain in arms and legs  pain in mouth  stomach pain This list may not describe all possible side effects. Call your doctor for medical advice about side effects. You may report side effects to FDA at 1-800-FDA-1088. Where should I keep my medicine? This drug is given in a hospital or clinic   and will not be stored at home. NOTE: This sheet is a summary. It may not cover all possible information. If you have questions about this medicine, talk to your doctor, pharmacist, or health care provider.  2020 Elsevier/Gold Standard (2017-03-05 11:10:55)  

## 2019-08-28 ENCOUNTER — Encounter: Payer: Self-pay | Admitting: Internal Medicine

## 2019-08-28 ENCOUNTER — Other Ambulatory Visit: Payer: Self-pay

## 2019-08-28 ENCOUNTER — Inpatient Hospital Stay: Payer: Self-pay

## 2019-08-28 ENCOUNTER — Inpatient Hospital Stay (HOSPITAL_BASED_OUTPATIENT_CLINIC_OR_DEPARTMENT_OTHER): Payer: Self-pay | Admitting: Internal Medicine

## 2019-08-28 DIAGNOSIS — D693 Immune thrombocytopenic purpura: Secondary | ICD-10-CM

## 2019-08-28 DIAGNOSIS — R519 Headache, unspecified: Secondary | ICD-10-CM

## 2019-08-28 DIAGNOSIS — G8929 Other chronic pain: Secondary | ICD-10-CM

## 2019-08-28 LAB — CBC WITH DIFFERENTIAL (CANCER CENTER ONLY)
Abs Immature Granulocytes: 0.22 10*3/uL — ABNORMAL HIGH (ref 0.00–0.07)
Basophils Absolute: 0 10*3/uL (ref 0.0–0.1)
Basophils Relative: 0 %
Eosinophils Absolute: 0 10*3/uL (ref 0.0–0.5)
Eosinophils Relative: 0 %
HCT: 39.4 % (ref 36.0–46.0)
Hemoglobin: 13.2 g/dL (ref 12.0–15.0)
Immature Granulocytes: 1 %
Lymphocytes Relative: 8 %
Lymphs Abs: 1.7 10*3/uL (ref 0.7–4.0)
MCH: 28.9 pg (ref 26.0–34.0)
MCHC: 33.5 g/dL (ref 30.0–36.0)
MCV: 86.4 fL (ref 80.0–100.0)
Monocytes Absolute: 0.4 10*3/uL (ref 0.1–1.0)
Monocytes Relative: 2 %
Neutro Abs: 18.4 10*3/uL — ABNORMAL HIGH (ref 1.7–7.7)
Neutrophils Relative %: 89 %
Platelet Count: 20 10*3/uL — ABNORMAL LOW (ref 150–400)
RBC: 4.56 MIL/uL (ref 3.87–5.11)
RDW: 13.8 % (ref 11.5–15.5)
WBC Count: 20.7 10*3/uL — ABNORMAL HIGH (ref 4.0–10.5)
nRBC: 0.1 % (ref 0.0–0.2)

## 2019-08-28 NOTE — Progress Notes (Signed)
[facility] at [facility] [address]  [place], [place] [address] [phone]   New Patient Evaluation  Date of Service: 08/28/19 Patient Name: [name] Patient MRN: [id number] Patient DOB: March 09, 1976 Provider: Henreitta Leber, MD  Identifying Statement:  [name] is a 44 y.o. female with headaches, gait impairment who presents for initial consultation and evaluation regarding associated neurologic deficits.    Referring Provider: Malachy Mood, MD [address] [place],  [place] [address]  Primary Cancer: ITP (heme, non-neoplastic)  History of Present Illness: The patient's records from the referring physician were obtained and reviewed and the patient interviewed to confirm this HPI.  [name] presents to discuss long standing history of headaches, as well as visual and gait impairments.  She describes chronic, daily pain in the back of her head, radiating up from neck.  No spread to frontal or temporal regions.  No associated nausea, vomiting, photophobia, or other migrainous features.  Symptoms have been occurring at least 4-5 years, if not longer.  Tylenol had helped previously but no longer very effective.  She also describes visual floaters which come and go; she was evaluated in depth by ophthalmology without any etiology uncovered.  Also complains of feeling "dizzy" for several years when walking, "feels like I could fall backwards".  She has never fallen, and does not need assistance to ambulate.  Continues to receive Nplate and decadron for chronic ITP through Dr. Mosetta Putt.  Last had CT head 6 months prior which was normal.       Medications: Current Outpatient Medications on File Prior to Visit  Medication Sig Dispense Refill  . acetaminophen (TYLENOL) 500 MG tablet Take 1-2 tablets (500-1,000 mg total) by mouth every 6 (six) hours as needed for mild pain or moderate pain. (Patient taking differently:  Take 500 mg by mouth daily as needed for mild pain or moderate pain. ) 30 tablet 0  . dexamethasone (DECADRON) 4 MG tablet Take 5 tablets (20 mg total) by mouth daily. 20 tablet 0  . pantoprazole (PROTONIX) 40 MG tablet Take 1 tablet (40 mg total) by mouth daily. 30 tablet 0  . meclizine (ANTIVERT) 25 MG tablet Take 1 tablet (25 mg total) by mouth 3 (three) times daily as needed for dizziness. (Patient not taking: Reported on 08/28/2019) 30 tablet 1   No current facility-administered medications on file prior to visit.    Allergies: No Known Allergies Past Medical History:  Past Medical History:  Diagnosis Date  . Anemia   . Chronic ITP (idiopathic thrombocytopenia) (HCC)   . Depression   . Headache   . 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   Past Surgical History:  Past Surgical History:  Procedure Laterality Date  . BREAST SURGERY  biopsy  . SPLENECTOMY, TOTAL N/A 09/16/2015   Procedure: OPEN SPLENECTOMY;  Surgeon: Chevis Pretty III, MD;  Location: [facility] OR;  Service: General;  Laterality: N/A;   Social History:  Social History   Socioeconomic History  . Marital status: Married    Spouse name: Not on file  . Number of children: Not on file  . Years of education: Not on file  . Highest education level: Not on file  Occupational History  . Occupation: packs personal products  Tobacco Use  . Smoking status: Never Smoker  . Smokeless tobacco: Never Used  Substance and Sexual Activity  . Alcohol use: No    Alcohol/week: 0.0 standard  drinks  . Drug use: No  . Sexual activity: Not on file  Other Topics Concern  . Not on file  Social History Narrative  . Not on file   Social Determinants of Health   Financial Resource Strain:   . Difficulty of Paying Living Expenses:   Food Insecurity:   . Worried About Programme researcher, broadcasting/film/video in the Last Year:   . Barista in the Last Year:   Transportation Needs:   . Freight forwarder  (Medical):   Marland Kitchen Lack of Transportation (Non-Medical):   Physical Activity:   . Days of Exercise per Week:   . Minutes of Exercise per Session:   Stress:   . Feeling of Stress :   Social Connections:   . Frequency of Communication with Friends and Family:   . Frequency of Social Gatherings with Friends and Family:   . Attends Religious Services:   . Active Member of Clubs or Organizations:   . Attends Banker Meetings:   Marland Kitchen Marital Status:   Intimate Partner Violence:   . Fear of Current or Ex-Partner:   . Emotionally Abused:   Marland Kitchen Physically Abused:   . Sexually Abused:    Family History:  Family History  Problem Relation Age of Onset  . Diabetes Mother   . Breast cancer Mother     Review of Systems: Constitutional: Doesn't report fevers, chills or abnormal weight loss Eyes: Doesn't report blurriness of vision Ears, nose, mouth, throat, and face: Doesn't report sore throat Respiratory: Doesn't report cough, dyspnea or wheezes Cardiovascular: Doesn't report palpitation, chest discomfort  Gastrointestinal:  Doesn't report nausea, constipation, diarrhea GU: Doesn't report incontinence Skin: Doesn't report skin rashes Neurological: Per HPI Musculoskeletal: Doesn't report joint pain Behavioral/Psych: Doesn't report anxiety  Physical Exam: Vitals:   08/28/19 1004  BP: 113/66  Pulse: 72  Resp: 18  Temp: 97.9 F (36.6 C)  SpO2: 99%   KPS: 90. General: Alert, cooperative, pleasant, in no acute distress Head: Normal EENT: No conjunctival injection or scleral icterus.  Lungs: Resp effort normal Cardiac: Regular rate Abdomen: Non-distended abdomen Skin: No rashes cyanosis or petechiae. Extremities: No clubbing or edema  Neurologic Exam: Mental Status: Awake, alert, attentive to examiner. Oriented to self and environment. Language is fluent with intact comprehension.  Cranial Nerves: Visual acuity is grossly normal. Visual fields are full. Extra-ocular  movements intact. No ptosis. Face is symmetric Motor: Tone and bulk are normal. Power is full in both arms and legs. Reflexes are symmetric, no pathologic reflexes present.  Sensory: Intact to light touch Gait: Normal.   Labs: I have reviewed the data as listed    Component Value Date/Time   NA 137 08/11/2019 1329   K 3.8 08/11/2019 1329   CL 104 08/11/2019 1329   CO2 26 08/11/2019 1329   GLUCOSE 125 (H) 08/11/2019 1329   BUN 20 08/11/2019 1329   CREATININE 0.84 08/11/2019 1329   CALCIUM 8.6 (L) 08/11/2019 1329   PROT 7.5 08/11/2019 1329   ALBUMIN 3.1 (L) 08/11/2019 1329   AST 11 (L) 08/11/2019 1329   ALT 14 08/11/2019 1329   ALKPHOS 78 08/11/2019 1329   BILITOT 0.2 (L) 08/11/2019 1329   GFRNONAA >60 08/11/2019 1329   GFRAA >60 08/11/2019 1329   Lab Results  Component Value Date   WBC 20.7 (H) 08/28/2019   NEUTROABS 18.4 (H) 08/28/2019   HGB 13.2 08/28/2019   HCT 39.4 08/28/2019   MCV 86.4 08/28/2019  PLT 20 (L) 08/28/2019     Assessment/Plan Occipital Neuralgia  [name] presents with headache syndrome most c/w occipital neuralgia.  Analgesia has been ineffective in recent months.  We recommended referral to Dr. Clydell Hakim of [facility] for greater occipital nerve blockade.  Gait was assessed today and no objective deficit was appreciated clinically.  She may consider additional sessions of physical therapy and continue to gradually increase physical activity to avoid deconditioning.  Recommended continuing to follow up with ophthalmology for visual complaints, do not suspect neurologic etiology here.   We spent twenty additional minutes teaching regarding the natural history, biology, and historical experience in the treatment of neurologic complications.   We appreciate the opportunity to participate in the care of [name].   All questions were answered. The patient knows to call the clinic with any problems,  questions or concerns. No barriers to learning were detected.  The total time spent in the encounter was 40 minutes and more than 50% was on counseling and review of test results   Ventura Sellers, MD Medical Director of Neuro-Oncology [facility] [place] 08/28/19 3:33 PM

## 2019-08-28 NOTE — Progress Notes (Signed)
[facility]   [phone] [phone]   Clinic Follow up Note   Patient Care Team: Patient, No Pcp Per as PCP - General (General Practice) Alice Belt, MD as Consulting Physician (Oncology) [name] (Hematology and Oncology) Alice Goltz, MD as Consulting Physician (Ophthalmology) Alice Main, MD (Internal Medicine)  Date of Service:  08/29/2019  CHIEF COMPLAINT: F/u of ITP  CURRENT THERAPY:  -Promacta 25mg  daily starting 10/22/18. Stopped 04/08/18 due to recurrent flares.  -Nplateq2weeks starting in 2 weeks. Increased to weekly on 08/11/19 due to recurrent ITP flares. Will give her 2 mcg/kg if platelet normal, and increase to 3 mcg/kg if platelet less than 150K. Increased to 64mcg/kg starting 09/03/19.   INTERVAL HISTORY:  [name] is here for a follow up of ITP. She presents to the clinic with her Spanish Interpretor. She denies feeling dizzy currently but does see a black spot. She was suppose to see her PCP today but canceled to come here. She notes she fasted today for lab work, but usually eats and drinks normally.    REVIEW OF SYSTEMS:   Constitutional: Denies fevers, chills or abnormal weight loss Eyes: Denies blurriness of vision Ears, nose, mouth, throat, and face: Denies mucositis or sore throat Respiratory: Denies cough, dyspnea or wheezes Cardiovascular: Denies palpitation, chest discomfort or lower extremity swelling Gastrointestinal:  Denies nausea, heartburn or change in bowel habits Skin: Denies abnormal skin rashes Lymphatics: Denies new lymphadenopathy or easy bruising Neurological:Denies numbness, tingling or new weaknesses Behavioral/Psych: Mood is stable, no new changes  All other systems were reviewed with the patient and are negative.  MEDICAL HISTORY:  Past Medical History:  Diagnosis Date  . Anemia   . Chronic ITP (idiopathic thrombocytopenia) (HCC)   . Depression   . Headache     . 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 BREAST SURGERY  biopsy  . 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  ALLERGIES:  has No Known Allergies.  MEDICATIONS:  Current Outpatient Medications  Medication Sig Dispense Refill  . acetaminophen (TYLENOL) 500 MG tablet Take 1-2 tablets (500-1,000 mg total) by mouth every 6 (six) hours as needed for mild pain or moderate pain. (Patient taking differently: Take 500 mg by mouth daily as needed for mild pain or moderate pain. ) 30 tablet 0  . dexamethasone (DECADRON) 4 MG tablet Take 5 tablets (20 mg total) by mouth daily. 20 tablet 0  . meclizine (ANTIVERT) 25 MG tablet Take 1 tablet (25 mg total) by mouth 3 (three) times daily as needed for dizziness. (Patient not taking: Reported on 08/28/2019) 30 tablet 1  . pantoprazole (PROTONIX) 40 MG tablet Take 1 tablet (40 mg total) by mouth daily. 30 tablet 0   No current facility-administered medications for this visit.    PHYSICAL EXAMINATION: ECOG PERFORMANCE STATUS: 1 - Symptomatic but completely ambulatory  Vitals:   08/29/19 0842 08/29/19 0900  BP: (!) 75/44 117/86  Pulse: 70   Resp: 17   Temp: 98 F (36.7 C)   SpO2: 100%    Filed Weights   08/29/19 0842  Weight: 246 lb 11.2 oz (  111.9 kg)    Due to COVID19 we will limit examination to appearance. Patient had no complaints.  GENERAL:alert, no distress and comfortable SKIN: skin color normal, no rashes or significant lesions EYES: normal, Conjunctiva are pink and non-injected, sclera clear  NEURO: alert & oriented x 3 with fluent speech   LABORATORY DATA:  I have reviewed the data as  listed CBC Latest Ref Rng & Units 08/29/2019 08/28/2019 08/27/2019  WBC 4.0 - 10.5 K/uL 22.0(H) 20.7(H) 8.0  Hemoglobin 12.0 - 15.0 g/dL 24.2 35.3 61.4  Hematocrit 36 - 46 % 37.5 39.4 37.1  Platelets 150 - 400 K/uL 36(L) 20(L) 10(L)     CMP Latest Ref Rng & Units 08/11/2019 08/05/2019 04/20/2019  Glucose 70 - 99 mg/dL 431(V) 400(Q) 676(P)  BUN 6 - 20 mg/dL 20 16 7   Creatinine 0.44 - 1.00 mg/dL 9.50 9.32  Sodium 135 - 145 mmol/L 137 136 136  Potassium 3.5 - 5.1 mmol/L 3.8 3.5 3.5  Chloride 98 - 111 mmol/L 104 105 100  CO2 22 - 32 mmol/L 26 23 23   Calcium 8.9 - 10.3 mg/dL 6.71) 9.5 9.0  Total Protein 6.5 - 8.1 g/dL 7.5 ) -  Total Bilirubin 0.3 - 1.2 mg/dL 2.4(P) 0.3 -  Alkaline Phos 38 - 126 U/L 78 52 -  AST 15 - 41 U/L 11(L) 19 -  ALT 0 - 44 U/L 14 19 -      RADIOGRAPHIC STUDIES: I have personally reviewed the radiological images as listed and agreed with the findings in the report. No results found.   ASSESSMENT & PLAN:  [name] is a 44 y.o. female with    1. 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 -She has had multiple hospitalization for acute ITP flare, last admission was last week for plt<5, she was treated with IVIG, short course Dexa, and high dose Nplate. Plt improved to 88K by 08/07/19.  -She is currently on Nplate weekly on dose 18mcg/kg, and will increase to 47mcg/kg  -Her platelet dropped to 10 K 2 days ago, she received Nplate 3 mcg/kg, and I put her on dexa 20mg  daily for 4 days  -Labs reviewed, WBC 22, plt improved to 36K, ANC 18.  -F/u in July   2.History of small foci ofsubarachnoid hemorrhage -Secondary to  #1 -recovered well overall.Will continue to monitor.  3.HA's  -She has been having intermittent HAs since 03/2019 hospitalization. Will monitor.  4. COVID19 (+) in early 04/2019 -She notes she had mild to moderate breathing issues. No fever. She was treated monoclonial antibodies.  -She has overall recovered well. -She is fine to proceed with COVID19 vaccine 3 months after her tested positive.  5. Blurry Vision -For the past 3 weeks she has had blurred vision. She only had 1 episode of dizziness, no HA.  -I recommend she f/u with her Ophthalmologist.  6. Chronic Dizziness, Hypotension -She notes since her latest hospital discharge she has increased dizziness standing or sitting along with seeing lights in her eyes.  -She notes she has had this in the past 4 years(since ITP Dx) with lower platelets then improves with treatment, but no improvement this time.  -I started her on Meclizine (08/11/19). I previously referred her to Dr 04/2019 for further eval.  -  She does have low BP today 75/44 (08/29/19). I recommend she f/u with her PCP about this soon as this has happened before. She notes she did fast for labs this morning. I explained she does not have to fast for labs. I encouraged her to monitor her BP at home.   7. Financial Support  -She pays out of pocket but submits her bills and receives pay assistance.   PLAN: -continue Lab and Nplate injection weekly, will increase Nplate dose to 3 mcg/kg -F/u on 7/21 as scheduled    No problem-specific Assessment & Plan notes found for this encounter.   No orders of the defined types were placed in this encounter.  All questions were answered. The patient knows to call the clinic with any problems, questions or concerns. No barriers to learning was detected. The total time spent in the appointment was 20 minutes.     Delphina Cahill 08/29/2019   Rogelia Rohrer, am acting as scribe for Malachy Mood, MD.   I have reviewed the  above documentation for accuracy and completeness, and I agree with the above.

## 2019-08-28 NOTE — Progress Notes (Signed)
Met with patient w/interpreter present to introduce myself as Arboriculturist and to offer available resources.  Discussed one-time $1000 Radio broadcast assistant to assist with personal expenses while going through treatment.  Gave her my card if interested in applying and for any additional financial questions or concerns.

## 2019-08-29 ENCOUNTER — Inpatient Hospital Stay: Payer: Self-pay

## 2019-08-29 ENCOUNTER — Inpatient Hospital Stay (HOSPITAL_BASED_OUTPATIENT_CLINIC_OR_DEPARTMENT_OTHER): Payer: Self-pay | Admitting: Hematology

## 2019-08-29 ENCOUNTER — Telehealth: Payer: Self-pay | Admitting: Hematology

## 2019-08-29 ENCOUNTER — Encounter: Payer: Self-pay | Admitting: Hematology

## 2019-08-29 ENCOUNTER — Other Ambulatory Visit: Payer: Self-pay

## 2019-08-29 VITALS — BP 117/86 | HR 70 | Temp 98.0°F | Resp 17 | Ht 59.0 in | Wt 246.7 lb

## 2019-08-29 DIAGNOSIS — D693 Immune thrombocytopenic purpura: Secondary | ICD-10-CM

## 2019-08-29 LAB — CBC WITH DIFFERENTIAL (CANCER CENTER ONLY)
Abs Immature Granulocytes: 0.34 10*3/uL — ABNORMAL HIGH (ref 0.00–0.07)
Basophils Absolute: 0 10*3/uL (ref 0.0–0.1)
Basophils Relative: 0 %
Eosinophils Absolute: 0 10*3/uL (ref 0.0–0.5)
Eosinophils Relative: 0 %
HCT: 37.5 % (ref 36.0–46.0)
Hemoglobin: 12.4 g/dL (ref 12.0–15.0)
Immature Granulocytes: 2 %
Lymphocytes Relative: 11 %
Lymphs Abs: 2.3 10*3/uL (ref 0.7–4.0)
MCH: 28.9 pg (ref 26.0–34.0)
MCHC: 33.1 g/dL (ref 30.0–36.0)
MCV: 87.4 fL (ref 80.0–100.0)
Monocytes Absolute: 1.3 10*3/uL — ABNORMAL HIGH (ref 0.1–1.0)
Monocytes Relative: 6 %
Neutro Abs: 18 10*3/uL — ABNORMAL HIGH (ref 1.7–7.7)
Neutrophils Relative %: 81 %
Platelet Count: 36 10*3/uL — ABNORMAL LOW (ref 150–400)
RBC: 4.29 MIL/uL (ref 3.87–5.11)
RDW: 14.3 % (ref 11.5–15.5)
WBC Count: 22 10*3/uL — ABNORMAL HIGH (ref 4.0–10.5)
nRBC: 0.1 % (ref 0.0–0.2)

## 2019-08-29 NOTE — Telephone Encounter (Signed)
No los per 6/11.  Printed calendar and avs per pt request

## 2019-08-29 NOTE — Progress Notes (Signed)
Met with patient/interpreter at registration to obtain income for one-time $1000 [employer].  Patient approved for grant to assist with personal expenses while going through treatment. Asked interpreter to explain expense sheet and how they are covered. Patient received gift card today.  She has my card for any additional financial questions or concerns.  Patient was screened by [employer] on 5/19 and found ineligible for Medicaid. I will have patient complete [employer] app on 6/16 to apply for charity care to determine if she qualifies for any more than the standard uninsured . Patient will automatically receive a 56% discount for services billed through [facility] for being uninsured. Patient has assistance through [facility] for treatment-related drug assistance.

## 2019-08-31 ENCOUNTER — Encounter: Payer: Self-pay | Admitting: Hematology

## 2019-09-03 ENCOUNTER — Other Ambulatory Visit: Payer: Self-pay

## 2019-09-03 ENCOUNTER — Inpatient Hospital Stay: Payer: Self-pay

## 2019-09-03 VITALS — BP 111/58 | HR 95 | Temp 98.2°F | Resp 18

## 2019-09-03 DIAGNOSIS — D693 Immune thrombocytopenic purpura: Secondary | ICD-10-CM

## 2019-09-03 LAB — CBC WITH DIFFERENTIAL (CANCER CENTER ONLY)
Abs Immature Granulocytes: 1.34 10*3/uL — ABNORMAL HIGH (ref 0.00–0.07)
Basophils Absolute: 0.1 10*3/uL (ref 0.0–0.1)
Basophils Relative: 1 %
Eosinophils Absolute: 0.3 10*3/uL (ref 0.0–0.5)
Eosinophils Relative: 2 %
HCT: 38.9 % (ref 36.0–46.0)
Hemoglobin: 12.7 g/dL (ref 12.0–15.0)
Immature Granulocytes: 7 %
Lymphocytes Relative: 22 %
Lymphs Abs: 4 10*3/uL (ref 0.7–4.0)
MCH: 28.3 pg (ref 26.0–34.0)
MCHC: 32.6 g/dL (ref 30.0–36.0)
MCV: 86.8 fL (ref 80.0–100.0)
Monocytes Absolute: 1.1 10*3/uL — ABNORMAL HIGH (ref 0.1–1.0)
Monocytes Relative: 6 %
Neutro Abs: 11.3 10*3/uL — ABNORMAL HIGH (ref 1.7–7.7)
Neutrophils Relative %: 62 %
Platelet Count: 46 10*3/uL — ABNORMAL LOW (ref 150–400)
RBC: 4.48 MIL/uL (ref 3.87–5.11)
RDW: 14.2 % (ref 11.5–15.5)
WBC Count: 18.2 10*3/uL — ABNORMAL HIGH (ref 4.0–10.5)
nRBC: 1 % — ABNORMAL HIGH (ref 0.0–0.2)

## 2019-09-03 MED ORDER — ROMIPLOSTIM INJECTION 500 MCG
3.0000 ug/kg | Freq: Once | SUBCUTANEOUS | Status: AC
  Administered 2019-09-03: 335 ug via SUBCUTANEOUS
  Filled 2019-09-03: qty 0.67

## 2019-09-03 NOTE — Patient Instructions (Signed)
Romiplostim injection Qu es este medicamento? El ROMIPLOSTIM ayuda a que su cuerpo produzca ms plaquetas. Este medicamento se utiliza para tratar el nivel bajo de plaquetas causado por prpura trombocitopnica inmunitaria crnica (PTI). Este medicamento puede ser utilizado para otros usos; si tiene alguna pregunta consulte con su proveedor de atencin mdica o con su farmacutico. MARCAS COMUNES: Nplate Qu le debo informar a mi profesional de la salud antes de tomar este medicamento? Necesitan saber si usted presenta alguno de los siguientes problemas o situaciones: trastornos de sangrado problema de mdula sea, tales como cncer en la sangre o sndrome mielodisplsico antecedentes de cogulos sanguneos enfermedad heptica ciruga para extirpar el bazo una reaccin alrgica o inusual al romiplostim, al manitol, a otros medicamentos, alimentos, colorantes o conservantes si est embarazada o buscando quedar embarazada si est amamantando a un beb Cmo debo utilizar este medicamento? Este medicamento se administra mediante una inyeccin por va subcutnea. Lo administra un profesional de la salud en un hospital o en un entorno clnico. Le entregarn una Gua del medicamento especial (MedGuide, su nombre en ingls) antes de la inyeccin. Lea esta informacin cada vez cuidadosamente. Hable con su pediatra para informarse acerca del uso de este medicamento en nios. Aunque este medicamento se puede recetar a nios tan pequeos como de 1 ao de edad con ciertos padecimientos, existen precauciones que deben tomarse. Sobredosis: Pngase en contacto inmediatamente con un centro toxicolgico o una sala de urgencia si usted cree que haya tomado demasiado medicamento. ATENCIN: Este medicamento es solo para usted. No comparta este medicamento con nadie. Qu sucede si me olvido de una dosis? Es importante no olvidar ninguna dosis. Informe a su mdico o a su profesional de la salud si no puede asistir a  una cita. Qu puede interactuar con este medicamento? No se esperan interacciones. Puede ser que esta lista no menciona todas las posibles interacciones. Informe a su profesional de la salud de todos los productos a base de hierbas, medicamentos de venta libre o suplementos nutritivos que est tomando. Si usted fuma, consume bebidas alcohlicas o si utiliza drogas ilegales, indqueselo tambin a su profesional de la salud. Algunas sustancias pueden interactuar con su medicamento. A qu debo estar atento al usar este medicamento? Se supervisar su estado de salud atentamente mientras reciba este medicamento. Visite a su profesional que lo receta o a su profesional de la salud para chequear su evolucin peridicamente y para realizarse a los anlisis de sangre necesarios. Es importante asistir a todas sus citas. Qu efectos secundarios puedo tener al utilizar este medicamento? Efectos secundarios que debe informar a su mdico o a su profesional de la salud tan pronto como sea posible: reacciones alrgicas, como erupcin cutnea, comezn/picazn o urticaria, e hinchazn de la cara, los labios o la lengua signos y sntomas de sangrado, tales como heces con sangre o de color negro y aspecto alquitranado; orina de color rojo o marrn oscuro; escupir sangre o material marrn que tiene el aspecto de granos de caf molido; manchas rojas en la piel; sangrado o moretones inusuales en los ojos, las encas o la nariz signos y sntomas de un cogulo sanguneo, tales como dolor en el pecho; falta de aire; dolor, hinchazn o calor en la pierna signos y sntomas de un accidente cerebrovascular, tales como cambios en la visin; confusin; dificultad para hablar o entender; dolores de cabeza severos; entumecimiento o debilidad repentina de la cara, el brazo o la pierna; problemas al caminar; mareo; prdida del equilibrio o la coordinacin Efectos secundarios   que generalmente no requieren atencin mdica (infrmelos a su  mdico o a Producer, television/film/video de la salud si persisten o si son molestos): dolor de Public house manager en los brazos y las Programme researcher, broadcasting/film/video en la boca dolor estomacal Puede ser que esta lista no menciona todos los posibles efectos secundarios. Comunquese a su mdico por asesoramiento mdico [employer]. Usted puede informar los efectos secundarios a la FDA por telfono al 1-800-FDA-1088. Dnde debo guardar mi medicina? Este medicamento se administra en hospitales o clnicas y no necesitar guardarlo en su domicilio. ATENCIN: Este folleto es un resumen. Puede ser que no cubra toda la posible informacin. Si usted tiene preguntas acerca de esta medicina, consulte con su mdico, su farmacutico o su profesional de Radiographer, therapeutic.  2020 Elsevier/Gold Standard (2017-06-07 00:00:00)

## 2019-09-09 ENCOUNTER — Telehealth: Payer: Self-pay | Admitting: *Deleted

## 2019-09-09 NOTE — Telephone Encounter (Signed)
Called Dr. Ollen Bowl @ [facility] and left message for Dondra Prader his coordinator to start referral for this patient for potential occipital nerve blockade.  Pending call back to provide information to start referral.

## 2019-09-10 ENCOUNTER — Other Ambulatory Visit: Payer: Self-pay

## 2019-09-10 ENCOUNTER — Inpatient Hospital Stay: Payer: Self-pay

## 2019-09-10 VITALS — BP 121/83 | HR 72 | Temp 98.6°F | Resp 18

## 2019-09-10 DIAGNOSIS — D693 Immune thrombocytopenic purpura: Secondary | ICD-10-CM

## 2019-09-10 LAB — CBC WITH DIFFERENTIAL (CANCER CENTER ONLY)
Abs Immature Granulocytes: 0.32 10*3/uL — ABNORMAL HIGH (ref 0.00–0.07)
Basophils Absolute: 0.1 10*3/uL (ref 0.0–0.1)
Basophils Relative: 0 %
Eosinophils Absolute: 0.1 10*3/uL (ref 0.0–0.5)
Eosinophils Relative: 1 %
HCT: 38.4 % (ref 36.0–46.0)
Hemoglobin: 12.5 g/dL (ref 12.0–15.0)
Immature Granulocytes: 2 %
Lymphocytes Relative: 19 %
Lymphs Abs: 3.2 10*3/uL (ref 0.7–4.0)
MCH: 28.8 pg (ref 26.0–34.0)
MCHC: 32.6 g/dL (ref 30.0–36.0)
MCV: 88.5 fL (ref 80.0–100.0)
Monocytes Absolute: 1.2 10*3/uL — ABNORMAL HIGH (ref 0.1–1.0)
Monocytes Relative: 7 %
Neutro Abs: 12.3 10*3/uL — ABNORMAL HIGH (ref 1.7–7.7)
Neutrophils Relative %: 71 %
Platelet Count: 32 10*3/uL — ABNORMAL LOW (ref 150–400)
RBC: 4.34 MIL/uL (ref 3.87–5.11)
RDW: 14.1 % (ref 11.5–15.5)
WBC Count: 17.2 10*3/uL — ABNORMAL HIGH (ref 4.0–10.5)
nRBC: 0.4 % — ABNORMAL HIGH (ref 0.0–0.2)

## 2019-09-10 MED ORDER — ROMIPLOSTIM INJECTION 500 MCG
4.0000 ug/kg | Freq: Once | SUBCUTANEOUS | Status: AC
  Administered 2019-09-10: 450 ug via SUBCUTANEOUS
  Filled 2019-09-10: qty 0.9

## 2019-09-10 NOTE — Progress Notes (Signed)
Increase nplate to 67mcg/kg today for plt =32.  Ok per on call MD, Dr Leonides Schanz

## 2019-09-10 NOTE — Patient Instructions (Signed)
Romiplostim injection What is this medicine? ROMIPLOSTIM (roe mi PLOE stim) helps your body make more platelets. This medicine is used to treat low platelets caused by chronic idiopathic thrombocytopenic purpura (ITP). This medicine may be used for other purposes; ask your health care provider or pharmacist if you have questions. COMMON BRAND NAME(S): Nplate What should I tell my health care provider before I take this medicine? They need to know if you have any of these conditions:  bleeding disorders  bone marrow problem, like blood cancer or myelodysplastic syndrome  history of blood clots  liver disease  surgery to remove your spleen  an unusual or allergic reaction to romiplostim, mannitol, other medicines, foods, dyes, or preservatives  pregnant or trying to get pregnant  breast-feeding How should I use this medicine? This medicine is for injection under the skin. It is given by a health care professional in a hospital or clinic setting. A special MedGuide will be given to you before your injection. Read this information carefully each time. Talk to your pediatrician regarding the use of this medicine in children. While this drug may be prescribed for children as young as 1 year for selected conditions, precautions do apply. Overdosage: If you think you have taken too much of this medicine contact a poison control center or emergency room at once. NOTE: This medicine is only for you. Do not share this medicine with others. What if I miss a dose? It is important not to miss your dose. Call your doctor or health care professional if you are unable to keep an appointment. What may interact with this medicine? Interactions are not expected. This list may not describe all possible interactions. Give your health care provider a list of all the medicines, herbs, non-prescription drugs, or dietary supplements you use. Also tell them if you smoke, drink alcohol, or use illegal drugs.  Some items may interact with your medicine. What should I watch for while using this medicine? Your condition will be monitored carefully while you are receiving this medicine. Visit your prescriber or health care professional for regular checks on your progress and for the needed blood tests. It is important to keep all appointments. What side effects may I notice from receiving this medicine? Side effects that you should report to your doctor or health care professional as soon as possible:  allergic reactions like skin rash, itching or hives, swelling of the face, lips, or tongue  signs and symptoms of bleeding such as bloody or black, tarry stools; red or dark brown urine; spitting up blood or brown material that looks like coffee grounds; red spots on the skin; unusual bruising or bleeding from the eyes, gums, or nose  signs and symptoms of a blood clot such as chest pain; shortness of breath; pain, swelling, or warmth in the leg  signs and symptoms of a stroke like changes in vision; confusion; trouble speaking or understanding; severe headaches; sudden numbness or weakness of the face, arm or leg; trouble walking; dizziness; loss of balance or coordination Side effects that usually do not require medical attention (report to your doctor or health care professional if they continue or are bothersome):  headache  pain in arms and legs  pain in mouth  stomach pain This list may not describe all possible side effects. Call your doctor for medical advice about side effects. You may report side effects to FDA at 1-800-FDA-1088. Where should I keep my medicine? This drug is given in a hospital or clinic   and will not be stored at home. NOTE: This sheet is a summary. It may not cover all possible information. If you have questions about this medicine, talk to your doctor, pharmacist, or health care provider.  2020 Elsevier/Gold Standard (2017-03-05 11:10:55)  

## 2019-09-17 ENCOUNTER — Inpatient Hospital Stay (HOSPITAL_BASED_OUTPATIENT_CLINIC_OR_DEPARTMENT_OTHER): Payer: Self-pay | Admitting: Hematology

## 2019-09-17 ENCOUNTER — Other Ambulatory Visit: Payer: Self-pay | Admitting: Hematology

## 2019-09-17 ENCOUNTER — Inpatient Hospital Stay: Payer: Self-pay

## 2019-09-17 ENCOUNTER — Encounter: Payer: Self-pay | Admitting: Hematology

## 2019-09-17 ENCOUNTER — Other Ambulatory Visit: Payer: Self-pay

## 2019-09-17 VITALS — BP 124/84 | HR 70 | Temp 98.5°F | Resp 18

## 2019-09-17 VITALS — BP 111/47 | HR 81 | Temp 98.2°F | Resp 17 | Ht 59.0 in | Wt 248.2 lb

## 2019-09-17 DIAGNOSIS — D693 Immune thrombocytopenic purpura: Secondary | ICD-10-CM

## 2019-09-17 LAB — CBC WITH DIFFERENTIAL (CANCER CENTER ONLY)
Abs Immature Granulocytes: 0.36 10*3/uL — ABNORMAL HIGH (ref 0.00–0.07)
Basophils Absolute: 0.1 10*3/uL (ref 0.0–0.1)
Basophils Relative: 1 %
Eosinophils Absolute: 0.2 10*3/uL (ref 0.0–0.5)
Eosinophils Relative: 2 %
HCT: 38.3 % (ref 36.0–46.0)
Hemoglobin: 12.6 g/dL (ref 12.0–15.0)
Immature Granulocytes: 3 %
Lymphocytes Relative: 22 %
Lymphs Abs: 3.2 10*3/uL (ref 0.7–4.0)
MCH: 29.2 pg (ref 26.0–34.0)
MCHC: 32.9 g/dL (ref 30.0–36.0)
MCV: 88.7 fL (ref 80.0–100.0)
Monocytes Absolute: 1 10*3/uL (ref 0.1–1.0)
Monocytes Relative: 7 %
Neutro Abs: 9.6 10*3/uL — ABNORMAL HIGH (ref 1.7–7.7)
Neutrophils Relative %: 65 %
Platelet Count: 7 10*3/uL — CL (ref 150–400)
RBC: 4.32 MIL/uL (ref 3.87–5.11)
RDW: 14.6 % (ref 11.5–15.5)
WBC Count: 14.5 10*3/uL — ABNORMAL HIGH (ref 4.0–10.5)
nRBC: 0.3 % — ABNORMAL HIGH (ref 0.0–0.2)

## 2019-09-17 MED ORDER — ROMIPLOSTIM INJECTION 500 MCG
6.0000 ug/kg | Freq: Once | SUBCUTANEOUS | Status: AC
  Administered 2019-09-17: 675 ug via SUBCUTANEOUS
  Filled 2019-09-17: qty 1

## 2019-09-17 MED ORDER — ROMIPLOSTIM INJECTION 500 MCG: 6.0000 ug/kg | Freq: Once | SUBCUTANEOUS | Status: DC

## 2019-09-17 MED ORDER — DIPHENHYDRAMINE HCL 25 MG PO CAPS
ORAL_CAPSULE | ORAL | Status: AC
  Filled 2019-09-17: qty 1

## 2019-09-17 MED ORDER — ACETAMINOPHEN 325 MG PO TABS
650.0000 mg | ORAL_TABLET | Freq: Once | ORAL | Status: AC
  Administered 2019-09-17: 650 mg via ORAL

## 2019-09-17 MED ORDER — DIPHENHYDRAMINE HCL 25 MG PO CAPS
25.0000 mg | ORAL_CAPSULE | Freq: Once | ORAL | Status: AC
  Administered 2019-09-17: 25 mg via ORAL

## 2019-09-17 MED ORDER — IMMUNE GLOBULIN (HUMAN) 10 GM/100ML IV SOLN
110.0000 g | Freq: Once | INTRAVENOUS | Status: AC
  Administered 2019-09-17: 110 g via INTRAVENOUS
  Filled 2019-09-17: qty 1000

## 2019-09-17 MED ORDER — DEXTROSE 5 % IV SOLN
Freq: Once | INTRAVENOUS | Status: AC
  Filled 2019-09-17: qty 250

## 2019-09-17 MED ORDER — ACETAMINOPHEN 325 MG PO TABS
ORAL_TABLET | ORAL | Status: AC
  Filled 2019-09-17: qty 2

## 2019-09-17 NOTE — Patient Instructions (Signed)
Immune Globulin Injection Qu es este medicamento? La INMUNOGLOBULINA ayuda a prevenir o reducir la severidad de ciertas infecciones en pacientes con riesgo de infeccin. Este medicamento se obtiene de la sangre combinada de varias personas. Se utiliza para tratar problemas del sistema inmunolgico, trombocitopenia y el sndrome de Kawasaki. Este medicamento puede ser utilizado para otros usos; si tiene alguna pregunta consulte con su proveedor de atencin mdica o con su farmacutico. MARCAS COMUNES: ASCENIV, Baygam, BIVIGAM, Carimune, Carimune NF, cutaquig, Cuvitru, Flebogamma, Flebogamma DIF, GamaSTAN, GamaSTAN S/D, Gamimune N, Gammagard, Gammagard S/D, Gammaked, Gammaplex, Gammar-P IV, Gamunex, Gamunex-C, Hizentra, Iveegam, Iveegam EN, Octagam, Panglobulin, Panglobulin NF, panzyga, Polygam S/D, Privigen, Sandoglobulin, Venoglobulin-S, Vigam, Vivaglobulin, Xembify Qu le debo informar a mi profesional de la salud antes de tomar este medicamento? Necesitan saber si usted presenta alguno de los siguientes problemas o situaciones: diabetes ausencia o niveles extremadamente bajos de anticuerpos inmunitarios en la sangre enfermedad cardiaca antecedentes de cogulos sanguneos hiperprolinemia infeccin en la sangre, sepsis enfermedad renal recientemente recibi o tiene programado recibir una vacuna una reaccin alrgica o inusual a la inmunoglobulina humana, albmina, maltosa, sucrosa, a otros medicamentos, alimentos, colorantes o conservantes si est embarazada o buscando quedar embarazada si est amamantando a un beb Cmo debo utilizar este medicamento? Este medicamento se administra mediante inyeccin en un msculo, o mediante infusin en una vena o en la piel. Generalmente lo administra un profesional de la salud en un hospital o en un entorno clnico. En raras ocasiones, algunas marcas comerciales de este medicamento podran darse en la casa. Le ensearn cmo administrar este medicamento. selo  exactamente como se le indique. Use su medicamento a intervalos regulares. No use su medicamento con una frecuencia mayor a la indicada. Hable con su pediatra para informarse acerca del uso de este medicamento en nios. Aunque este medicamento se puede recetar para ciertas afecciones, existen precauciones que deben tomarse. Sobredosis: Pngase en contacto inmediatamente con un centro toxicolgico o una sala de urgencia si usted cree que haya tomado demasiado medicamento. ATENCIN: Este medicamento es solo para usted. No comparta este medicamento con nadie. Qu sucede si me olvido de una dosis? Es importante de no olvidar ninguna dosis. Informe a su mdico o a su profesional de la salud si no puede asistir a una cita. Si se administra el medicamento usted mismo y se le olvida una dosis, sela lo antes posible. Si es casi la hora de la prxima dosis, use slo esa dosis. No use dosis adicionales o dobles. Qu puede interactuar con este medicamento?  aspirina o medicamentos tipo aspirina  cisplatino  ciclosporina  medicamentos para infecciones, tales como aciclovir, adefovir, anfotericina B, bacitracina, cidofovir, foscarnet, ganciclovir, gentamicina, pentamidina, vancomicina  los AINE, medicamentos para el dolor o inflamacin, como ibuprofeno o naproxeno  pamidronato  vacunas  cido zoledrnico Puede ser que esta lista no menciona todas las posibles interacciones. Informe a su profesional de la salud de todos los productos a base de hierbas, medicamentos de venta libre o suplementos nutritivos que est tomando. Si usted fuma, consume bebidas alcohlicas o si utiliza drogas ilegales, indqueselo tambin a su profesional de la salud. Algunas sustancias pueden interactuar con su medicamento. A qu debo estar atento al usar este medicamento? Se supervisar su estado de salud atentamente mientras reciba este medicamento. Este medicamento se hace a partir de donaciones de sangre agrupadas de  muchas personas distintas. Es posible transmitir una infeccin con este medicamento. Sin embargo, a los donantes se les realizan pruebas para detectar infecciones y a todos   los productos se les realizan pruebas para detectar el VIH y la hepatitis. Este medicamento es sometido a un tratamiento para matar la mayora o la totalidad de bacterias y virus. Hable con su mdico sobre los riesgos y beneficios de este medicamento. No se aplique ninguna vacuna durante al menos 14 das antes, o al menos hasta 3 meses despus de recibir este medicamento. Qu efectos secundarios puedo tener al utilizar este medicamento? Efectos secundarios que debe informar a su mdico o a su profesional de la salud tan pronto como sea posible: reacciones alrgicas, como erupcin cutnea, comezn/picazn o urticaria, e hinchazn de la cara, los labios o la lengua labios o piel de color azul problemas para respirar dolor u opresin en el pecho fiebre signos y sntomas de meningitis asptica, tales como cuello rgido; sensibilidad a la luz; dolor de cabeza; somnolencia; fiebre; nuseas; vmito; erupcin cutnea signos y sntomas de un cogulo sanguneo, tales como dolor en el pecho; falta de aire; dolor, hinchazn o calor en la pierna signos y sntomas de anemia hemoltica, tales como ritmo cardiaco rpido; cansancio; orina color amarillo oscuro o marrn; o color amarillo de los ojos o la piel signos y sntomas de lesin al rin, tales como dificultad para orinar o cambios en la cantidad de orina aumento de peso repentino hinchazn de tobillos, pies, manos Efectos secundarios que generalmente no requieren atencin mdica (infrmelos a su mdico o a su profesional de la salud si persisten o si son molestos): diarrea enrojecimiento dolor de cabeza aumento de la sudoracin dolor en las articulaciones calambres musculares dolor muscular nuseas dolor, enrojecimiento o irritacin en el lugar de la inyeccin cansancio Puede ser que esta lista no  menciona todos los posibles efectos secundarios. Comunquese a su mdico por asesoramiento mdico sobre los efectos secundarios. Usted puede informar los efectos secundarios a la FDA por telfono al 1-800-FDA-1088. Dnde debo guardar mi medicina? Mantngala fuera del alcance de los nios. Este medicamento se administra en hospitales o clnicas y no necesitar guardarlo en su domicilio. En pocos casos, algunas marcas de este medicamento se administran en su domicilio. Si est usando este medicamento en su domicilio recibir instrucciones sobre cmo guardar este medicamento. Deseche todo el medicamento que no haya utilizado, despus de la fecha de vencimiento. ATENCIN: Este folleto es un resumen. Puede ser que no cubra toda la posible informacin. Si usted tiene preguntas acerca de esta medicina, consulte con su mdico, su farmacutico o su profesional de la salud.  2020 Elsevier/Gold Standard (2019-01-08 00:00:00)  

## 2019-09-17 NOTE — Progress Notes (Signed)
[facility]   [phone] [phone]   Clinic Follow up Note   Patient Care Team: Patient, No Pcp Per as PCP - General (General Practice) Levert Feinstein, MD as Consulting Physician (Oncology) [name] (Hematology and Oncology) Elwin Mocha, MD as Consulting Physician (Ophthalmology) Justice Deeds, MD (Internal Medicine)  Date of Service:  09/17/2019  CHIEF COMPLAINT: F/u of ITP  CURRENT THERAPY:  -Promacta 25mg  daily starting 10/22/18. Stopped 04/08/18 due to recurrent flares.  -Nplateq2weeks starting in 2 weeks. Increased to weekly on 08/11/19 due to recurrent ITP flares.Willgive her 2 mcg/kg if platelet normal, and increase to 3 mcg/kg if platelet less than 150K. Increased to 55mcg/kg starting 09/03/19. Increased to 22mcg/kg on 09/10/19 and increase again to 9mcg/kg starting 09/17/19.   INTERVAL HISTORY:  [name] is here for a follow up of ITP. She presents to the clinic with electronic [employer] Spanish interpretor. She denies recent bleeding. She notes she did have a fall on her knees after leg weakness with walking. She did not bleed much from this, mostly abrasions.    REVIEW OF SYSTEMS:   Constitutional: Denies fevers, chills or abnormal weight loss Eyes: Denies blurriness of vision Ears, nose, mouth, throat, and face: Denies mucositis or sore throat Respiratory: Denies cough, dyspnea or wheezes Cardiovascular: Denies palpitation, chest discomfort or lower extremity swelling Gastrointestinal:  Denies nausea, heartburn or change in bowel habits Skin: Denies abnormal skin rashes (+) Abrasions of left knee  Lymphatics: Denies new lymphadenopathy or easy bruising Neurological:Denies numbness, tingling or new weaknesses Behavioral/Psych: Mood is stable, no new changes  All other systems were reviewed with the patient and are negative.  MEDICAL HISTORY:  Past Medical History:  Diagnosis Date  . Anemia   .  Chronic ITP (idiopathic thrombocytopenia) (HCC)   . Depression   . Headache   . 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 BREAST SURGERY  biopsy  . 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  ALLERGIES:  has No Known Allergies.  MEDICATIONS:  Current Outpatient Medications  Medication Sig Dispense Refill  . acetaminophen (TYLENOL) 500 MG tablet Take 1-2 tablets (500-1,000 mg total) by mouth every 6 (six) hours as needed for mild pain or moderate pain. (Patient taking differently: Take 500 mg by mouth daily as needed for mild pain or moderate pain. ) 30 tablet 0  . dexamethasone (DECADRON) 4 MG tablet Take 5 tablets (20 mg total) by mouth daily. 20 tablet 0  . meclizine (ANTIVERT) 25 MG tablet Take 1 tablet (25 mg total) by mouth 3 (three) times daily as needed for dizziness. (Patient not taking: Reported on 08/28/2019) 30 tablet 1  . pantoprazole (PROTONIX) 40 MG tablet Take 1 tablet (40 mg total) by mouth daily. 30 tablet 0   No current facility-administered medications for this visit.    PHYSICAL EXAMINATION: ECOG PERFORMANCE STATUS: 1 - Symptomatic but completely ambulatory Blood pressure 124/84, heart rate 70, respirate 18, pulse ox 98% on room air Due to COVID19 we will limit examination to appearance. Patient had no complaints.  GENERAL:alert, no distress and comfortable SKIN: skin color normal, no rashes or  significant lesions (+) Abrasions with process below the left knee from recent fall  EYES: normal, Conjunctiva are pink and non-injected, sclera clear  NEURO: alert & oriented x 3 with fluent speech   LABORATORY DATA:  I have reviewed  the data as listed CBC Latest Ref Rng & Units 09/17/2019 09/10/2019 09/03/2019  WBC 4.0 - 10.5 K/uL 14.5(H) 17.2(H) 18.2(H)  Hemoglobin 12.0 - 15.0 g/dL 19.1 47.8 29.5  Hematocrit 36 - 46 % 38.3 38.4 38.9  Platelets 150 - 400 K/uL 7(LL) 32(L) 46(L)     CMP Latest Ref Rng & Units 08/11/2019 08/05/2019 04/20/2019  Glucose 70 - 99 mg/dL 621(H) 086(V) 784(O)  BUN 6 - 20 mg/dL 20 16 7   Creatinine 0.44 - 1.00 mg/dL 9.62 9.52  Sodium 135 - 145 mmol/L 137 136 136  Potassium 3.5 - 5.1 mmol/L 3.8 3.5 3.5  Chloride 98 - 111 mmol/L 104 105 100  CO2 22 - 32 mmol/L 26 23 23   Calcium 8.9 - 10.3 mg/dL 8.41) 9.5 9.0  Total Protein 6.5 - 8.1 g/dL 7.5 ) -  Total Bilirubin 0.3 - 1.2 mg/dL 3.2(G) 0.3 -  Alkaline Phos 38 - 126 U/L 78 52 -  AST 15 - 41 U/L 11(L) 19 -  ALT 0 - 44 U/L 14 19 -      RADIOGRAPHIC STUDIES: I have personally reviewed the radiological images as listed and agreed with the findings in the report. No results found.   ASSESSMENT & PLAN:  [name] is a 44 y.o. female with   1. 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 -She has had multiple hospitalization for acute ITP flare, last admission was in 5/17/21for plt<5, she wastreated with IVIG, short course Dexa, and high dose Nplate. Plt improved to 88K by 08/07/19. -She is currently on Nplate weekly, dose was increased to 13mcg/kg last week.  -She had acute flare with plt 10K on 6/9, I treated with dexa and increase dose Nplate, platelet went up to 46K after two weeks  -CBC today show WBC 14.5, plt 7K, ANC 9.6. Overall she did not have full response of most recent ITP flare  which was treated as outpatient. I will increase her weekly Nplate to 81mcg/kg today. Will give her one dose IVIG 1mg /kg  Today.  IVIG replacement was initiated more than a month ago, patient confirmed that she has applied for Medicaid but it was denied.  So we should be able to get IVIG replaced (per our pharmacy).  She is clinically stable, no signs of bleeding, will treat her as outpt with close f/u  -Due to her frequent recurrence of ITP, I recommend adding Rituxan weekly X4 to better control her disease.  She previously received Rituxan in 2017.  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  I discussed with patient, she is agreeable to try Rituxan.   2.History of small foci ofsubarachnoid hemorrhage, Secondary to #1 -recovered well overall.Will continue to monitor. 3.HA's  -She has been havingintermittentHAs since1/2021hospitalization.Will monitor. -see by Dr. 11m   4. COVID19 (+) in early 04/2019 -She notes she had mild to moderate breathing issues. No fever. She  was treated monoclonial antibodies.  -She has overall recovered well.She is fine to proceed with COVID19 vaccine.   5. Blurry Vision -Over a 3 week period she has had blurred vision. She only had 1 episode of dizziness, no HA.  -I recommend she f/u with her Ophthalmologist.  6. Chronic Dizziness, Hypotension -Since her latest hospital discharge she has noted increased dizziness standing or sitting along with seeing lights in her eyes.  -She notes she has had this in the past 4 years(since ITP Dx) with lower platelets then improves with treatment, but no improvement this time.  -I started her on Meclizine (08/11/19). I previously referred her to Dr Barbaraann Cao for further eval.   7. Financial Support  -She pays out of pocket but submits her bills and receives pay assistance.   PLAN: -Proceed  with Nplate with increased dose to 66mcg/kg today, continue weekly  -will give one dose IVIG 1mg /kg today, per pharmacy, IVIG will be replaced since she has applied Medicaid (was denied per pt)  -Lab tomorrow and the day after tomorrow, and early next week for close follow-up -Patient knows to call or go to emergency room if she develops active bleeding -give limited response to steroids, will hold on dexa for now  -will try to get Rituxan replaced, plan to start in 3 to 4 weeks if we are able to replace it. -I called her daughter and left a VM to update her condition  -f/u next week     No problem-specific Assessment & Plan notes found for this encounter.   No orders of the defined types were placed in this encounter.  All questions were answered. The patient knows to call the clinic with any problems, questions or concerns. No barriers to learning was detected. The total time spent in the appointment was 40 minutes.     [place], MD 09/17/2019   I, 09/19/2019, am acting as scribe for Delphina Cahill, MD.   I have reviewed the above documentation for accuracy and completeness, and I agree with the above.

## 2019-09-17 NOTE — Progress Notes (Signed)
START OFF PATHWAY REGIMEN - Other   [id number]:Rituximab (IV/Subcut) D1 Weekly:   A cycle is every 7 days:     Rituximab-xxxx      Rituximab and hyaluronidase human   **Always confirm dose/schedule in your pharmacy ordering system**  Patient Characteristics: Intent of Therapy: Curative Intent, Discussed with Patient 

## 2019-09-17 NOTE — Progress Notes (Signed)
MD increasing Nplate to 60mcg/kg per Dr. Mosetta Putt due to pltc today.  Levester Fresh, PharmD, BCPS, BCOP

## 2019-09-17 NOTE — Progress Notes (Signed)
Sent CAFA application via interoffce mail 09/10/19 to [facility] Office [place]:Customer Service to be scanned and reviewed.

## 2019-09-17 NOTE — Patient Instructions (Signed)
Romiplostim injection What is this medicine? ROMIPLOSTIM (roe mi PLOE stim) helps your body make more platelets. This medicine is used to treat low platelets caused by chronic idiopathic thrombocytopenic purpura (ITP). This medicine may be used for other purposes; ask your health care provider or pharmacist if you have questions. COMMON BRAND NAME(S): Nplate What should I tell my health care provider before I take this medicine? They need to know if you have any of these conditions:  bleeding disorders  bone marrow problem, like blood cancer or myelodysplastic syndrome  history of blood clots  liver disease  surgery to remove your spleen  an unusual or allergic reaction to romiplostim, mannitol, other medicines, foods, dyes, or preservatives  pregnant or trying to get pregnant  breast-feeding How should I use this medicine? This medicine is for injection under the skin. It is given by a health care professional in a hospital or clinic setting. A special MedGuide will be given to you before your injection. Read this information carefully each time. Talk to your pediatrician regarding the use of this medicine in children. While this drug may be prescribed for children as young as 1 year for selected conditions, precautions do apply. Overdosage: If you think you have taken too much of this medicine contact a poison control center or emergency room at once. NOTE: This medicine is only for you. Do not share this medicine with others. What if I miss a dose? It is important not to miss your dose. Call your doctor or health care professional if you are unable to keep an appointment. What may interact with this medicine? Interactions are not expected. This list may not describe all possible interactions. Give your health care provider a list of all the medicines, herbs, non-prescription drugs, or dietary supplements you use. Also tell them if you smoke, drink alcohol, or use illegal drugs.  Some items may interact with your medicine. What should I watch for while using this medicine? Your condition will be monitored carefully while you are receiving this medicine. Visit your prescriber or health care professional for regular checks on your progress and for the needed blood tests. It is important to keep all appointments. What side effects may I notice from receiving this medicine? Side effects that you should report to your doctor or health care professional as soon as possible:  allergic reactions like skin rash, itching or hives, swelling of the face, lips, or tongue  signs and symptoms of bleeding such as bloody or black, tarry stools; red or dark brown urine; spitting up blood or brown material that looks like coffee grounds; red spots on the skin; unusual bruising or bleeding from the eyes, gums, or nose  signs and symptoms of a blood clot such as chest pain; shortness of breath; pain, swelling, or warmth in the leg  signs and symptoms of a stroke like changes in vision; confusion; trouble speaking or understanding; severe headaches; sudden numbness or weakness of the face, arm or leg; trouble walking; dizziness; loss of balance or coordination Side effects that usually do not require medical attention (report to your doctor or health care professional if they continue or are bothersome):  headache  pain in arms and legs  pain in mouth  stomach pain This list may not describe all possible side effects. Call your doctor for medical advice about side effects. You may report side effects to FDA at 1-800-FDA-1088. Where should I keep my medicine? This drug is given in a hospital or clinic   and will not be stored at home. NOTE: This sheet is a summary. It may not cover all possible information. If you have questions about this medicine, talk to your doctor, pharmacist, or health care provider.  2020 Elsevier/Gold Standard (2017-03-05 11:10:55)  

## 2019-09-17 NOTE — Progress Notes (Signed)
Critical value called in to AD or platelets 7.  Communicated this to [facility], RN who is supporting Dr. Mosetta Putt today.  Lorayne Marek, RN

## 2019-09-18 ENCOUNTER — Other Ambulatory Visit: Payer: Self-pay

## 2019-09-19 ENCOUNTER — Inpatient Hospital Stay: Payer: Self-pay | Attending: Nurse Practitioner

## 2019-09-19 ENCOUNTER — Telehealth: Payer: Self-pay

## 2019-09-19 ENCOUNTER — Encounter (HOSPITAL_COMMUNITY): Payer: Self-pay

## 2019-09-19 ENCOUNTER — Emergency Department (HOSPITAL_COMMUNITY)
Admission: EM | Admit: 2019-09-19 | Discharge: 2019-09-20 | Disposition: A | Discharge status: Home / Self Care | Payer: Self-pay | Attending: Emergency Medicine | Admitting: Emergency Medicine

## 2019-09-19 ENCOUNTER — Emergency Department (HOSPITAL_COMMUNITY): Payer: Self-pay

## 2019-09-19 ENCOUNTER — Other Ambulatory Visit: Payer: Self-pay

## 2019-09-19 DIAGNOSIS — R519 Headache, unspecified: Secondary | ICD-10-CM | POA: Insufficient documentation

## 2019-09-19 DIAGNOSIS — Z79899 Other long term (current) drug therapy: Secondary | ICD-10-CM | POA: Insufficient documentation

## 2019-09-19 DIAGNOSIS — Z7952 Long term (current) use of systemic steroids: Secondary | ICD-10-CM | POA: Diagnosis not present

## 2019-09-19 DIAGNOSIS — R7303 Prediabetes: Secondary | ICD-10-CM | POA: Insufficient documentation

## 2019-09-19 DIAGNOSIS — H538 Other visual disturbances: Secondary | ICD-10-CM | POA: Diagnosis not present

## 2019-09-19 DIAGNOSIS — R42 Dizziness and giddiness: Secondary | ICD-10-CM | POA: Insufficient documentation

## 2019-09-19 DIAGNOSIS — D693 Immune thrombocytopenic purpura: Secondary | ICD-10-CM | POA: Insufficient documentation

## 2019-09-19 DIAGNOSIS — Z9081 Acquired absence of spleen: Secondary | ICD-10-CM | POA: Insufficient documentation

## 2019-09-19 DIAGNOSIS — I959 Hypotension, unspecified: Secondary | ICD-10-CM | POA: Diagnosis not present

## 2019-09-19 DIAGNOSIS — Z8616 Personal history of COVID-19: Secondary | ICD-10-CM | POA: Diagnosis not present

## 2019-09-19 DIAGNOSIS — R221 Localized swelling, mass and lump, neck: Secondary | ICD-10-CM | POA: Insufficient documentation

## 2019-09-19 LAB — CBC WITH DIFFERENTIAL (CANCER CENTER ONLY)
Abs Immature Granulocytes: 0.15 10*3/uL — ABNORMAL HIGH (ref 0.00–0.07)
Basophils Absolute: 0.1 10*3/uL (ref 0.0–0.1)
Basophils Relative: 1 %
Eosinophils Absolute: 0.2 10*3/uL (ref 0.0–0.5)
Eosinophils Relative: 2 %
HCT: 36.6 % (ref 36.0–46.0)
Hemoglobin: 12 g/dL (ref 12.0–15.0)
Immature Granulocytes: 2 %
Lymphocytes Relative: 25 %
Lymphs Abs: 2.5 10*3/uL (ref 0.7–4.0)
MCH: 29.2 pg (ref 26.0–34.0)
MCHC: 32.8 g/dL (ref 30.0–36.0)
MCV: 89.1 fL (ref 80.0–100.0)
Monocytes Absolute: 1 10*3/uL (ref 0.1–1.0)
Monocytes Relative: 10 %
Neutro Abs: 6.3 10*3/uL (ref 1.7–7.7)
Neutrophils Relative %: 60 %
Platelet Count: 50 10*3/uL — ABNORMAL LOW (ref 150–400)
RBC: 4.11 MIL/uL (ref 3.87–5.11)
RDW: 14.6 % (ref 11.5–15.5)
WBC Count: 10.3 10*3/uL (ref 4.0–10.5)
nRBC: 0.4 % — ABNORMAL HIGH (ref 0.0–0.2)

## 2019-09-19 LAB — CBC WITH DIFFERENTIAL/PLATELET
Abs Immature Granulocytes: 0.17 10*3/uL — ABNORMAL HIGH (ref 0.00–0.07)
Basophils Absolute: 0.1 10*3/uL (ref 0.0–0.1)
Basophils Relative: 1 %
Eosinophils Absolute: 0.1 10*3/uL (ref 0.0–0.5)
Eosinophils Relative: 1 %
HCT: 37.5 % (ref 36.0–46.0)
Hemoglobin: 12.3 g/dL (ref 12.0–15.0)
Immature Granulocytes: 2 %
Lymphocytes Relative: 26 %
Lymphs Abs: 2.5 10*3/uL (ref 0.7–4.0)
MCH: 28.6 pg (ref 26.0–34.0)
MCHC: 32.8 g/dL (ref 30.0–36.0)
MCV: 87.2 fL (ref 80.0–100.0)
Monocytes Absolute: 1 10*3/uL (ref 0.1–1.0)
Monocytes Relative: 10 %
Neutro Abs: 5.6 10*3/uL (ref 1.7–7.7)
Neutrophils Relative %: 60 %
Platelets: 65 10*3/uL — ABNORMAL LOW (ref 150–400)
RBC: 4.3 MIL/uL (ref 3.87–5.11)
RDW: 14.6 % (ref 11.5–15.5)
WBC: 9.4 10*3/uL (ref 4.0–10.5)
nRBC: 0.5 % — ABNORMAL HIGH (ref 0.0–0.2)

## 2019-09-19 LAB — BASIC METABOLIC PANEL
Anion gap: 9 (ref 5–15)
BUN: 10 mg/dL (ref 6–20)
CO2: 26 mmol/L (ref 22–32)
Calcium: 8.9 mg/dL (ref 8.9–10.3)
Chloride: 100 mmol/L (ref 98–111)
Creatinine, Ser: 0.58 mg/dL (ref 0.44–1.00)
GFR calc Af Amer: >60 mL/min (ref >60)
GFR calc non Af Amer: >60 mL/min (ref >60)
Glucose, Bld: 109 mg/dL — ABNORMAL HIGH (ref 70–99)
Potassium: 3.5 mmol/L (ref 3.5–5.1)
Sodium: 135 mmol/L (ref 135–145)

## 2019-09-19 MED ORDER — ONDANSETRON HCL 4 MG/2ML IJ SOLN
4.0000 mg | Freq: Once | INTRAMUSCULAR | Status: AC
  Administered 2019-09-19: 4 mg via INTRAVENOUS
  Filled 2019-09-19: qty 2

## 2019-09-19 MED ORDER — HYDROCODONE-ACETAMINOPHEN 5-325 MG PO TABS: 1.0000 | ORAL_TABLET | ORAL | 0 refills | Status: DC | PRN

## 2019-09-19 MED ORDER — DIPHENHYDRAMINE HCL 50 MG/ML IJ SOLN
12.5000 mg | Freq: Once | INTRAMUSCULAR | Status: AC
  Administered 2019-09-19: 12.5 mg via INTRAVENOUS
  Filled 2019-09-19: qty 1

## 2019-09-19 MED ORDER — HYDROCODONE-ACETAMINOPHEN 5-325 MG PO TABS
1.0000 | ORAL_TABLET | Freq: Once | ORAL | Status: AC
  Administered 2019-09-20: 1 via ORAL
  Filled 2019-09-19: qty 1

## 2019-09-19 MED ORDER — HYDROMORPHONE HCL 1 MG/ML IJ SOLN
1.0000 mg | Freq: Once | INTRAMUSCULAR | Status: AC
  Administered 2019-09-19: 1 mg via INTRAVENOUS
  Filled 2019-09-19: qty 1

## 2019-09-19 MED ORDER — SODIUM CHLORIDE 0.9 % IV BOLUS
500.0000 mL | Freq: Once | INTRAVENOUS | Status: AC
  Administered 2019-09-19: 500 mL via INTRAVENOUS

## 2019-09-19 NOTE — Progress Notes (Signed)
[facility]   [phone] [phone]   Clinic Follow up Note   Patient Care Team: Malachy Mood, MD as PCP - General (Hematology) Levert Feinstein, MD as Consulting Physician (Oncology) [name] (Hematology and Oncology) Elwin Mocha, MD as Consulting Physician (Ophthalmology) Justice Deeds, MD (Internal Medicine)  Date of Service:  09/24/2019  CHIEF COMPLAINT: F/u of refractory ITP  PREVIOUS AND CURRENT THERAPY:  -Promacta 25mg  daily starting 10/22/18. Stopped 04/08/18 due to recurrent flares.  -Nplateq2weeks starting in 2 weeks. Increased to weekly on 08/11/19 due to recurrent ITP flares.Willgive her 2 mcg/kg if platelet normal, and increase to 3 mcg/kg if platelet less than 150K. Increased to 80mcg/kg starting 09/03/19.Increased to 31mcg/kg on 09/10/19 and increase again to 47mcg/kg starting 09/17/19. -PENDING Rituxan weekly X4 starting in 2 weeks.   INTERVAL HISTORY:  [name] is here for a follow up of ITP. She presents to the clinic with her Spanish interpretor. She notes having a lump on the back of upper mid right neck. Her headaches has improved.    REVIEW OF SYSTEMS:   Constitutional: Denies fevers, chills or abnormal weight loss (+) Improved headaches  Eyes: Denies blurriness of vision Ears, nose, mouth, throat, and face: Denies mucositis or sore throat Respiratory: Denies cough, dyspnea or wheezes Cardiovascular: Denies palpitation, chest discomfort or lower extremity swelling Gastrointestinal:  Denies nausea, heartburn or change in bowel habits Skin: Denies abnormal skin rashes (+) lump on the back of upper mid right neck Lymphatics: Denies new lymphadenopathy or easy bruising Neurological:Denies numbness, tingling or new weaknesses Behavioral/Psych: Mood is stable, no new changes  All other systems were reviewed with the patient and are negative.  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  ALLERGIES:  has No Known Allergies.  MEDICATIONS:  Current Outpatient Medications  Medication Sig Dispense Refill   acetaminophen (TYLENOL) 500 MG tablet Take 1-2 tablets (500-1,000 mg total) by mouth every 6 (six) hours as needed for mild pain or moderate pain. (Patient taking differently: Take 1,000 mg by mouth daily as needed for mild pain or moderate pain. ) 30 tablet 0   dexamethasone (DECADRON) 4 MG tablet Take 5 tablets (20 mg total) by mouth daily. 20 tablet 0   HYDROcodone-acetaminophen (NORCO/VICODIN) 5-325 MG tablet Take 1 tablet by mouth every 4 (four) hours as needed for severe pain. 10 tablet 0   meclizine (ANTIVERT) 25 MG tablet Take 1 tablet (25 mg total) by mouth 3 (three) times daily as needed for dizziness. (Patient taking differently: Take 25 mg by mouth daily as needed for dizziness. ) 30 tablet 1   pantoprazole (PROTONIX) 40 MG tablet Take 1 tablet (40 mg total) by mouth daily. (Patient taking differently: Take 40 mg by mouth daily as needed (for heartburn). ) 30 tablet 0   No current facility-administered medications for this visit.    PHYSICAL  EXAMINATION: ECOG PERFORMANCE STATUS: 2 - Symptomatic, <50% confined to bed  Vitals:   09/24/19 1037  BP: 124/88  Pulse: 77  Resp: 18  Temp: 98.6 F (37 C)  SpO2: 98%   Filed Weights   09/24/19 1037  Weight: 245 lb 14.4 oz (111.5 kg)    Due to COVID19 we will limit  examination to appearance. Patient had no complaints.  GENERAL:alert, no distress and comfortable SKIN: skin color normal, no rashes or significant lesions EYES: normal, Conjunctiva are pink and non-injected, sclera clear  NEURO: alert & oriented x 3 with fluent speech   LABORATORY DATA:  I have reviewed the data as listed CBC Latest Ref Rng & Units 09/24/2019 09/19/2019 09/19/2019  WBC 4.0 - 10.5 K/uL 11.2(H) 9.4 10.3  Hemoglobin 12.0 - 15.0 g/dL 50.0 93.8 18.2  Hematocrit 36 - 46 % 41.4 37.5 36.6  Platelets 150 - 400 K/uL 23(L) 65(L) 50(L)     CMP Latest Ref Rng & Units 09/19/2019 08/11/2019 08/05/2019  Glucose 70 - 99 mg/dL 993(Z) 169(C) 789(F)  BUN 6 - 20 mg/dL 10 20 16   Creatinine 0.44 - 1.00 mg/dL 8.10 1.75  Sodium 135 - 145 mmol/L 135 137 136  Potassium 3.5 - 5.1 mmol/L 3.5 3.8 3.5  Chloride 98 - 111 mmol/L 100 104 105  CO2 22 - 32 mmol/L 26 26 23   Calcium 8.9 - 10.3 mg/dL 8.9 1.02) 9.5  Total Protein 6.5 - 8.1 g/dL - 7.5 )  Total Bilirubin 0.3 - 1.2 mg/dL - 5.8(N) 0.3  Alkaline Phos 38 - 126 U/L - 78 52  AST 15 - 41 U/L - 11(L) 19  ALT 0 - 44 U/L - 14 19      RADIOGRAPHIC STUDIES: I have personally reviewed the radiological images as listed and agreed with the findings in the report. No results found.   ASSESSMENT & PLAN:  [name] is a 44 y.o. female with    1. 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 -She has had multiple hospitalization for acute ITP flare, last admission was in 5/17/21for plt<5, she wastreated with IVIG, short course Dexa, and high dose Nplate. Plt improved to 88K by 08/07/19.She had acute  flare with plt 10K on 6/9, I treated as outpatient with dexa and increase dose Nplate, platelet went up to 46K after two weeks. She did not respond fully.  -She is currently onNplateweekly, dose was increased to 82mcg/kg 09/17/19.   -She had another acute flare last week with plt 7K (09/17/19). She was treated as outpatient with Nplate 76mcg/kg and IVIG 1mg /kg. She did have better response this time, but did not last long. Plt improved to 65K over 2 days but 7 days later her plt decreased again to 23K today (09/24/19).  -I will give one more dose IV IG in next few days   -Due to her frequent recurrence of ITP, I recommend adding Rituxan weekly X4 to better control her disease. She previously received Rituxan in 2017. I reviewed side effects with her and gave her print out of information. We are looking into drug replacement for her. Plan to start in 2 weeks if drug available.  -Her previous hepatitis B testing showed previous infection and immunity to HBV -I will  also give another Dexamethasone 20mg  daily for 4 days starting tomorrow 09/25/19.  -Continue weekly labs.  -F/u in 2 weeks.   2.History of small foci ofsubarachnoid hemorrhage, Secondary to #1 -recovered well overall.Will continue to monitor.  3.HA's  -She has been havingintermittentHAs since1/2021hospitalization.Continue to f/u with Dr. 11/26/19  -She did have recent severe headache on 09/19/19. Work up and CT head negative for bleeding.  -Dr 11/20/19 has referred her to pain specialist Dr Barbaraann Cao for pain management. She has not been contacted yet, I will f/u.   4. COVID19 (+) in early 04/2019 -She notes she had mild to moderate breathing issues. No fever. She was treated monoclonial antibodies.  -She has overall recovered well.She is fine to proceed with COVID19 vaccine.   5. Blurry Vision -Over a 3 week period she has had blurred vision. She only had 1 episode of dizziness, no HA.  -I recommend she f/u with her  Ophthalmologist.  6. Chronic Dizziness, Hypotension -Since her latest hospital discharge she has noted increased dizziness standing or sitting along with seeing lights in her eyes.  -She notes she has had this in the past 4 years(since ITP Dx) with lower platelets then improves with treatment, but no improvement this time.  -Istarted her onMeclizine (08/11/19). Ipreviouslyreferred her toDr 08/13/19 for further eval.   7. Financial Support  -She pays out of pocket but submits her bills and receives pay assistance. -Her application for Medicaid was denied   PLAN: -lab reviewed, worsening thrombocytopenia  -I called in Dexamethasone 4mg  5 tabs daily for 4 days starting 09/25/19.  -Proceed with Nplate 53mcg/kg today -Continue Lab and Nplate weekly and adjust dose as needed (up to 34mcg/kg) -F/u and Rituxan in 2 weeks.   No problem-specific Assessment & Plan notes found for this encounter.   No orders of the defined types were placed in this encounter.  All questions were answered. The patient knows to call the clinic with any problems, questions or concerns. No barriers to learning was detected. The total time spent in the appointment was 30 minutes.     11m, MD 09/24/2019   I, Malachy Mood, am acting as scribe for 11/25/2019, MD.   I have reviewed the above documentation for accuracy and completeness, and I agree with the above.

## 2019-09-19 NOTE — ED Triage Notes (Signed)
Patient reports that she was called today and told that she had low platelets. Platelets-50.  Patient states she began having a headache yesterday.

## 2019-09-19 NOTE — Discharge Instructions (Addendum)
Return if any problems.

## 2019-09-19 NOTE — Telephone Encounter (Signed)
[name] called asking about her mothers platelet count.  I returned her call and let her know platelet count is 50,000.  I told her she does not need lab work on Tuesday.  I told her Dr. Mosetta Putt wants to see her on Wednesdays after he injection.  She then told me her mom has a severe headache 10/10, not like her usual h/a.  She denies n/v/blurred vision/ dizziness.  Reviewed with Dr. Mosetta Putt Ms [name] is to go to the [facility] for evaluation.  [name] verbalized understanding.

## 2019-09-19 NOTE — ED Provider Notes (Signed)
[facility]-EMERGENCY DEPT Provider Note   CSN: [id number] Arrival date & time: 09/19/19  1657     History Chief Complaint  Patient presents with  . Abnormal Lab  . Headache    [name] is a 44 y.o. female.  No language interpreter was used.  Abnormal Lab Time since result:  Platelets 50 Patient referred by:  Specialist Result type: hematology   Hematology:    Platelets:  Low Headache Pain location:  Occipital Quality:  Sharp and stabbing Onset quality:  Unable to specify Timing:  Constant Progression:  Worsening Chronicity:  Recurrent Similar to prior headaches: yes   Relieved by:  Nothing Worsened by:  Nothing Ineffective treatments:  None tried Risk factors: no anger and does not have insomnia    Pt has chronic occipital headaches.  Pt has been seen by [facility] oncology.  Pt has chronic itp.  Pt has low platelets.  Pt here for evaluation due to headache and low platelets. Pt has had a subarachnoid in the past.   Pt worried about having again.   Past Medical History:  Diagnosis Date  . Anemia   . Chronic ITP (idiopathic thrombocytopenia) (HCC)   . Depression   . Headache   . 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 Chronic headaches 08/28/2019  . Thrombocytopenia (HCC) 03/02/2019  . Borderline diabetes 10/16/2018  . Idiopathic thrombocytopenic purpura (ITP) (HCC) 10/14/2018  . Obesity, Class III, BMI 40-49.9 (morbid obesity) (HCC) 10/14/2018  . Hyperglycemia 10/14/2018  . 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 Chronic ITP (idiopathic thrombocytopenia) (HCC)   . Nontraumatic intracerebral hemorrhage (HCC)   . Menorrhagia with regular cycle   . Pressure ulcer 09/19/2015  . Gingival bleeding   . Chronic hepatitis B (HCC) 09/04/2015  . Subarachnoidal hemorrhage (HCC) 09/04/2015  . Severe thrombocytopenia (HCC)  09/04/2015  . Acute ITP (HCC) 08/28/2015    Past Surgical History:  Procedure Laterality Date  . BREAST SURGERY  biopsy  . 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 Diabetes Mother   . Breast cancer Mother     Social History   Tobacco Use  . Smoking status: Never Smoker  . Smokeless tobacco: Never Used  Vaping Use  . Vaping Use: Never used  Substance Use Topics  . Alcohol use: No    Alcohol/week: 0.0 standard drinks  . Drug use: No    Home Medications Prior to Admission medications   Medication Sig Start Date End Date Taking? Authorizing Provider  acetaminophen (TYLENOL) 500 MG tablet Take 1-2 tablets (500-1,000 mg total) by mouth every 6 (six) hours as needed for mild pain or moderate pain. Patient taking differently: Take 1,000 mg by mouth daily as needed for mild pain or moderate pain.  09/22/15  Yes Burns,  Alexa R, MD  dexamethasone (DECADRON) 4 MG tablet Take 5 tablets (20 mg total) by mouth daily. 08/27/19  Yes Malachy Mood, MD  meclizine (ANTIVERT) 25 MG tablet Take 1 tablet (25 mg total) by mouth 3 (three) times daily as needed for dizziness. Patient taking differently: Take 25 mg by mouth daily as needed for dizziness.  08/11/19  Yes Malachy Mood, MD  pantoprazole (PROTONIX) 40 MG tablet Take 1 tablet (40 mg total) by mouth daily. Patient taking differently: Take 40 mg by mouth daily as needed (for heartburn).  08/06/19  Yes CurcioReita May, NP    Allergies    Patient has no known allergies.  Review of Systems   Review of Systems  Neurological: Positive for headaches.  All other systems reviewed and are negative.   Physical  Exam Updated Vital Signs BP (!) 99/56   Pulse 79   Temp 98.9 F (37.2 C) (Oral)   Resp 16   Ht 4\' 11"  (1.499 m)   Wt 112.6 kg   LMP 09/07/2019   SpO2 96%   BMI 50.13 kg/m   Physical Exam Vitals and nursing note reviewed.  Constitutional:      Appearance: She is well-developed.  HENT:     Head: Normocephalic.     Mouth/Throat:     Mouth: Mucous membranes are moist.  Eyes:     Extraocular Movements: Extraocular movements intact.     Pupils: Pupils are equal, round, and reactive to light.  Cardiovascular:     Rate and Rhythm: Normal rate.  Pulmonary:     Effort: Pulmonary effort is normal.  Abdominal:     General: There is no distension.  Musculoskeletal:        General: Normal range of motion.     Cervical back: Normal range of motion.  Neurological:     Mental Status: She is alert and oriented to person, place, and time.  Psychiatric:        Mood and Affect: Mood normal.     ED Results / Procedures / Treatments   Labs (all labs ordered are listed, but only abnormal results are displayed) Labs Reviewed  CBC WITH DIFFERENTIAL/PLATELET - Abnormal; Notable for the following components:      Result Value   Platelets 65 (*)    nRBC 0.5 (*)    Abs Immature Granulocytes 0.17 (*)    All other components within normal limits  BASIC METABOLIC PANEL - Abnormal; Notable for the following components:   Glucose, Bld 109 (*)    All other components within normal limits    EKG None  Radiology CT Head Wo Contrast  Result Date: 09/19/2019 CLINICAL DATA:  Headache, low platelets, normal neuro exam. Additional provided: Patient reports headache which began yesterday. EXAM: CT HEAD WITHOUT CONTRAST TECHNIQUE: Contiguous axial images were obtained from the base of the skull through the vertex without intravenous contrast. COMPARISON:  Prior head CT 03/02/2019 FINDINGS: Brain: Cerebral volume is normal for age. There is no acute intracranial hemorrhage. No demarcated cortical  infarct. No extra-axial fluid collection. No evidence of intracranial mass. No midline shift. Redemonstrated partially empty sella turcica. Vascular: No hyperdense vessel. Skull: Normal. Negative for fracture or focal lesion. Sinuses/Orbits: Visualized orbits show no acute finding. No significant paranasal sinus disease or mastoid effusion at the imaged levels. IMPRESSION: No CT evidence of acute intracranial abnormality. Redemonstrated partially empty sella turcica. This is very commonly an incidental finding, but can be associated with idiopathic intracranial hypertension. Unchanged small scattered  nonspecific parenchymal calcifications. Electronically Signed   By: Jackey Loge DO   On: 09/19/2019 18:57    Procedures Procedures (including critical care time)  Medications Ordered in ED Medications  HYDROmorphone (DILAUDID) injection 1 mg (1 mg Intravenous Given 09/19/19 1959)  ondansetron (ZOFRAN) injection 4 mg (4 mg Intravenous Given 09/19/19 2000)  sodium chloride 0.9 % bolus 500 mL (500 mLs Intravenous New Bag/Given 09/19/19 2118)  diphenhydrAMINE (BENADRYL) injection 12.5 mg (12.5 mg Intravenous Given 09/19/19 2118)    ED Course  I have reviewed the triage vital signs and the nursing notes.  Pertinent labs & imaging results that were available during my care of the patient were reviewed by me and considered in my medical decision making (see chart for details).    MDM Rules/Calculators/A&P                          MDM:  Pt given IV dilaudid for pain.  Ct scan  No acute abnormality.platelet count has improved to 65   Pt reports no relief.  Pt given benadryl IV.  Pt had some relief but medication began to wear off quickly.  Pt request medication for pain at home  Final Clinical Impression(s) / ED Diagnoses Final diagnoses:  Bad headache    Rx / DC Orders ED Discharge Orders         Ordered    HYDROcodone-acetaminophen (NORCO/VICODIN) 5-325 MG tablet  Every 4 hours PRN     Discontinue   Reprint     09/19/19 2318        An After Visit Summary was printed and given to the patient.    Elson Areas, Cordelia Poche 09/19/19 2318    Milagros Loll, MD 09/20/19 [phone]

## 2019-09-23 ENCOUNTER — Inpatient Hospital Stay: Payer: Self-pay

## 2019-09-24 ENCOUNTER — Inpatient Hospital Stay: Payer: Self-pay

## 2019-09-24 ENCOUNTER — Inpatient Hospital Stay (HOSPITAL_BASED_OUTPATIENT_CLINIC_OR_DEPARTMENT_OTHER): Payer: Self-pay | Admitting: Hematology

## 2019-09-24 ENCOUNTER — Other Ambulatory Visit: Payer: Self-pay

## 2019-09-24 ENCOUNTER — Telehealth: Payer: Self-pay | Admitting: Hematology

## 2019-09-24 ENCOUNTER — Encounter: Payer: Self-pay | Admitting: Hematology

## 2019-09-24 VITALS — BP 111/83 | HR 82 | Temp 98.5°F | Resp 20

## 2019-09-24 VITALS — BP 124/88 | HR 77 | Temp 98.6°F | Resp 18 | Ht 59.0 in | Wt 245.9 lb

## 2019-09-24 DIAGNOSIS — D693 Immune thrombocytopenic purpura: Secondary | ICD-10-CM | POA: Diagnosis not present

## 2019-09-24 LAB — CBC WITH DIFFERENTIAL (CANCER CENTER ONLY)
Abs Immature Granulocytes: 0.27 10*3/uL — ABNORMAL HIGH (ref 0.00–0.07)
Basophils Absolute: 0.1 10*3/uL (ref 0.0–0.1)
Basophils Relative: 1 %
Eosinophils Absolute: 0.2 10*3/uL (ref 0.0–0.5)
Eosinophils Relative: 2 %
HCT: 41.4 % (ref 36.0–46.0)
Hemoglobin: 13.4 g/dL (ref 12.0–15.0)
Immature Granulocytes: 2 %
Lymphocytes Relative: 25 %
Lymphs Abs: 2.8 10*3/uL (ref 0.7–4.0)
MCH: 28.2 pg (ref 26.0–34.0)
MCHC: 32.4 g/dL (ref 30.0–36.0)
MCV: 87.2 fL (ref 80.0–100.0)
Monocytes Absolute: 1.2 10*3/uL — ABNORMAL HIGH (ref 0.1–1.0)
Monocytes Relative: 11 %
Neutro Abs: 6.5 10*3/uL (ref 1.7–7.7)
Neutrophils Relative %: 59 %
Platelet Count: 23 10*3/uL — ABNORMAL LOW (ref 150–400)
RBC: 4.75 MIL/uL (ref 3.87–5.11)
RDW: 14.4 % (ref 11.5–15.5)
WBC Count: 11.2 10*3/uL — ABNORMAL HIGH (ref 4.0–10.5)
nRBC: 0.5 % — ABNORMAL HIGH (ref 0.0–0.2)

## 2019-09-24 MED ORDER — ROMIPLOSTIM INJECTION 500 MCG
7.0000 ug/kg | Freq: Once | SUBCUTANEOUS | Status: AC
  Administered 2019-09-24: 790 ug via SUBCUTANEOUS
  Filled 2019-09-24: qty 1.08

## 2019-09-24 MED ORDER — DEXAMETHASONE 4 MG PO TABS: 20.0000 mg | ORAL_TABLET | Freq: Every day | ORAL | 0 refills | Status: DC

## 2019-09-24 NOTE — Progress Notes (Addendum)
Increase Nplate dose to 7 mcg/kg today per Dr. Mosetta Putt for platelet count of 23,000.   Carylon Perches, PharmD, BCPS, BCOP Oncology Pharmacist Pharmacy Phone: [phone] 09/24/2019

## 2019-09-24 NOTE — Telephone Encounter (Signed)
Scheduled per 7/7 los. Noted to give pt updated calendar.

## 2019-09-24 NOTE — Patient Instructions (Signed)
Romiplostim injection What is this medicine? ROMIPLOSTIM (roe mi PLOE stim) helps your body make more platelets. This medicine is used to treat low platelets caused by chronic idiopathic thrombocytopenic purpura (ITP). This medicine may be used for other purposes; ask your health care provider or pharmacist if you have questions. COMMON BRAND NAME(S): Nplate What should I tell my health care provider before I take this medicine? They need to know if you have any of these conditions:  bleeding disorders  bone marrow problem, like blood cancer or myelodysplastic syndrome  history of blood clots  liver disease  surgery to remove your spleen  an unusual or allergic reaction to romiplostim, mannitol, other medicines, foods, dyes, or preservatives  pregnant or trying to get pregnant  breast-feeding How should I use this medicine? This medicine is for injection under the skin. It is given by a health care professional in a hospital or clinic setting. A special MedGuide will be given to you before your injection. Read this information carefully each time. Talk to your pediatrician regarding the use of this medicine in children. While this drug may be prescribed for children as young as 1 year for selected conditions, precautions do apply. Overdosage: If you think you have taken too much of this medicine contact a poison control center or emergency room at once. NOTE: This medicine is only for you. Do not share this medicine with others. What if I miss a dose? It is important not to miss your dose. Call your doctor or health care professional if you are unable to keep an appointment. What may interact with this medicine? Interactions are not expected. This list may not describe all possible interactions. Give your health care provider a list of all the medicines, herbs, non-prescription drugs, or dietary supplements you use. Also tell them if you smoke, drink alcohol, or use illegal drugs.  Some items may interact with your medicine. What should I watch for while using this medicine? Your condition will be monitored carefully while you are receiving this medicine. Visit your prescriber or health care professional for regular checks on your progress and for the needed blood tests. It is important to keep all appointments. What side effects may I notice from receiving this medicine? Side effects that you should report to your doctor or health care professional as soon as possible:  allergic reactions like skin rash, itching or hives, swelling of the face, lips, or tongue  signs and symptoms of bleeding such as bloody or black, tarry stools; red or dark brown urine; spitting up blood or brown material that looks like coffee grounds; red spots on the skin; unusual bruising or bleeding from the eyes, gums, or nose  signs and symptoms of a blood clot such as chest pain; shortness of breath; pain, swelling, or warmth in the leg  signs and symptoms of a stroke like changes in vision; confusion; trouble speaking or understanding; severe headaches; sudden numbness or weakness of the face, arm or leg; trouble walking; dizziness; loss of balance or coordination Side effects that usually do not require medical attention (report to your doctor or health care professional if they continue or are bothersome):  headache  pain in arms and legs  pain in mouth  stomach pain This list may not describe all possible side effects. Call your doctor for medical advice about side effects. You may report side effects to FDA at 1-800-FDA-1088. Where should I keep my medicine? This drug is given in a hospital or clinic   and will not be stored at home. NOTE: This sheet is a summary. It may not cover all possible information. If you have questions about this medicine, talk to your doctor, pharmacist, or health care provider.  2020 Elsevier/Gold Standard (2017-03-05 11:10:55)  

## 2019-09-27 ENCOUNTER — Other Ambulatory Visit: Payer: Self-pay

## 2019-09-27 ENCOUNTER — Inpatient Hospital Stay: Payer: Self-pay

## 2019-09-27 VITALS — BP 113/64 | HR 67 | Temp 97.9°F | Resp 17

## 2019-09-27 DIAGNOSIS — D693 Immune thrombocytopenic purpura: Secondary | ICD-10-CM

## 2019-09-27 MED ORDER — DIPHENHYDRAMINE HCL 25 MG PO CAPS
ORAL_CAPSULE | ORAL | Status: AC
  Filled 2019-09-27: qty 1

## 2019-09-27 MED ORDER — DIPHENHYDRAMINE HCL 25 MG PO CAPS
25.0000 mg | ORAL_CAPSULE | Freq: Once | ORAL | Status: AC
  Administered 2019-09-27: 25 mg via ORAL

## 2019-09-27 MED ORDER — IMMUNE GLOBULIN (HUMAN) 20 GM/200ML IV SOLN
110.0000 g | Freq: Once | INTRAVENOUS | Status: AC
  Administered 2019-09-27: 110 g via INTRAVENOUS
  Filled 2019-09-27: qty 400

## 2019-09-27 MED ORDER — ACETAMINOPHEN 325 MG PO TABS
650.0000 mg | ORAL_TABLET | Freq: Once | ORAL | Status: AC
  Administered 2019-09-27: 650 mg via ORAL

## 2019-09-27 MED ORDER — ACETAMINOPHEN 325 MG PO TABS
ORAL_TABLET | ORAL | Status: AC
  Filled 2019-09-27: qty 2

## 2019-09-27 NOTE — Patient Instructions (Signed)
Immune Globulin Injection Qu es este medicamento? La INMUNOGLOBULINA ayuda a prevenir o reducir la severidad de ciertas infecciones en pacientes con riesgo de infeccin. Este medicamento se obtiene de la sangre [place] de Armed forces technical officer. Se utiliza para tratar problemas del sistema inmunolgico, trombocitopenia y el sndrome de [place]. Este medicamento puede ser utilizado para otros usos; si tiene [place] pregunta consulte con su proveedor de atencin mdica o con su Social research officer, government. MARCAS COMUNES: ASCENIV, [place]am, BIVIGAM, Carimune, Carimune NF, cutaquig, Cuvitru, Flebogamma, Flebogamma DIF, GamaSTAN, GamaSTAN S/D, Gamimune N, Gammagard, Gammagard S/D, Gammaked, Gammaplex, Gammar-P IV, Gamunex, Gamunex-C, Hizentra, Iveegam, Iveegam EN, Octagam, Panglobulin, Panglobulin NF, panzyga, Polygam S/D, Privigen, Sandoglobulin, Venoglobulin-S, Vigam, Vivaglobulin, Xembify [employer] debo informar a mi profesional de la salud antes de tomar este medicamento? Necesitan saber si usted presenta alguno de los siguientes problemas o situaciones: diabetes ausencia o niveles extremadamente bajos de anticuerpos inmunitarios en la sangre enfermedad cardiaca antecedentes de cogulos sanguneos hiperprolinemia infeccin en la sangre, sepsis enfermedad renal recientemente recibi o tiene programado recibir una vacuna una reaccin alrgica o inusual a la inmunoglobulina humana, albmina, maltosa, sucrosa, a otros medicamentos, alimentos, colorantes o conservantes si est embarazada o buscando quedar embarazada si est amamantando a un beb Cmo debo utilizar este medicamento? Este medicamento se administra mediante inyeccin en un msculo, o mediante infusin en una vena o en la piel. Generalmente lo administra un profesional de Radiographer, therapeutic en un hospital o en un entorno clnico. En raras ocasiones, algunas marcas comerciales de este medicamento podran darse en la casa. Le ensearn cmo Engineer, materials. selo  exactamente como se le indique. Use su medicamento a intervalos regulares. No use su medicamento con una frecuencia mayor a la indicada. Hable con su pediatra para informarse acerca del uso de este medicamento en nios. Aunque este medicamento se puede recetar para ciertas afecciones, existen precauciones que deben tomarse. Sobredosis: Pngase en contacto inmediatamente con un centro toxicolgico o una sala de urgencia si usted cree que haya tomado demasiado medicamento. ATENCIN: [employer] es solo para usted. No comparta este medicamento con nadie. Qu sucede si me olvido de una dosis? Es importante de no olvidar ninguna dosis. Informe a su mdico o a su profesional de la salud si no puede asistir a Marketing executive. Si se administra el medicamento usted mismo y se le [place] una dosis, sela lo antes posible. Si es casi la hora de la prxima dosis, use slo esa dosis. No use dosis adicionales o dobles. Qu puede interactuar con este medicamento?  aspirina o medicamentos tipo aspirina  cisplatino  ciclosporina  medicamentos para infecciones, tales como aciclovir, adefovir, anfotericina B, bacitracina, cidofovir, foscarnet, ganciclovir, gentamicina, pentamidina, vancomicina  los AINE, medicamentos para el dolor o inflamacin, como ibuprofeno o naproxeno  pamidronato  vacunas  cido zoledrnico Puede ser que esta lista no menciona todas las posibles interacciones. Informe a su profesional de [employer] de [employer] productos a base de hierbas, medicamentos de [place] o suplementos nutritivos que est tomando. Si usted fuma, consume bebidas alcohlicas o si utiliza drogas ilegales, indqueselo tambin a su profesional de [employer]. Algunas sustancias pueden interactuar con su medicamento. A qu debo estar atento al usar [employer]? Se supervisar su estado de salud atentamente mientras reciba este medicamento. Este medicamento se hace a partir de donaciones de sangre agrupadas de  [employer]. Es posible transmitir una infeccin con [employer]. Sin embargo, a los donantes se les realizan pruebas para Engineer, manufacturing infecciones y a Acupuncturist  los productos se les realizan pruebas para Architectural technologist VIH y la hepatitis. Este medicamento es sometido a un tratamiento para Air cabin crew o la totalidad de bacterias y virus. Hable con su mdico [employer] y beneficios de [employer]. No se aplique ninguna vacuna durante al menos [address] antes, o al menos hasta 3 meses despus de recibir [employer]. Qu efectos secundarios puedo tener al [employer] este medicamento? Efectos secundarios que debe informar a su mdico o a Producer, television/film/video de la salud tan pronto como sea posible: Therapist, art, como erupcin cutnea, comezn/picazn o urticaria, e hinchazn de la cara, los labios o la lengua labios o piel de color azul problemas para Dietitian u opresin en el pecho fiebre signos y sntomas de meningitis asptica, tales como cuello rgido; sensibilidad a Statistician; Engineer, mining de Training and development officer; somnolencia; fiebre; nuseas; vmito; erupcin cutnea signos y sntomas de un cogulo sanguneo, tales como dolor en el pecho; falta de aire; dolor, hinchazn o calor en la pierna signos y sntomas de anemia hemoltica, tales como ritmo cardiaco rpido; cansancio; orina color amarillo oscuro o marrn; o color amarillo de los ojos o la piel signos y sntomas de lesin al rin, tales como dificultad para Geographical information systems officer o cambios en la cantidad de orina aumento de peso repentino hinchazn de tobillos, pies, manos Efectos secundarios que generalmente no requieren Psychologist, prison and probation services (infrmelos a su mdico o a Producer, television/film/video de la salud si persisten o si son molestos): diarrea enrojecimiento dolor de cabeza aumento de la sudoracin dolor en las articulaciones calambres musculares dolor muscular Therapist, sports, enrojecimiento o Marketing executive de la inyeccin cansancio Puede ser que esta lista no  menciona todos los posibles efectos secundarios. Comunquese a su mdico por asesoramiento mdico [employer]. Usted puede informar los efectos secundarios a la FDA por telfono al 1-800-FDA-1088. Dnde debo guardar mi medicina? Mantngala fuera del alcance de los nios. Este medicamento se administra en hospitales o clnicas y no necesitar guardarlo en su domicilio. En pocos casos, algunas marcas de [employer] se administran en su domicilio. Si est Producer, television/film/video en su domicilio recibir instrucciones sobre cmo guardar este medicamento. Deseche todo el medicamento que no haya utilizado, despus de la fecha de vencimiento. ATENCIN: Este folleto es un resumen. Puede ser que no cubra toda la posible informacin. Si usted tiene preguntas acerca de esta medicina, consulte con su mdico, su farmacutico o su profesional de Radiographer, therapeutic.  2020 Elsevier/Gold Standard (2019-01-08 00:00:00)

## 2019-09-27 NOTE — Progress Notes (Signed)
Patient declined to stay for observation post IVIG, feels well, VS taken.

## 2019-10-01 ENCOUNTER — Inpatient Hospital Stay: Payer: Self-pay

## 2019-10-01 ENCOUNTER — Telehealth: Payer: Self-pay

## 2019-10-01 ENCOUNTER — Other Ambulatory Visit: Payer: Self-pay

## 2019-10-01 VITALS — BP 132/67 | HR 70 | Temp 98.0°F | Resp 18

## 2019-10-01 DIAGNOSIS — D693 Immune thrombocytopenic purpura: Secondary | ICD-10-CM

## 2019-10-01 LAB — CBC WITH DIFFERENTIAL (CANCER CENTER ONLY)
Abs Immature Granulocytes: 1.64 10*3/uL — ABNORMAL HIGH (ref 0.00–0.07)
Basophils Absolute: 0.3 10*3/uL — ABNORMAL HIGH (ref 0.0–0.1)
Basophils Relative: 1 %
Eosinophils Absolute: 0.1 10*3/uL (ref 0.0–0.5)
Eosinophils Relative: 1 %
HCT: 39.6 % (ref 36.0–46.0)
Hemoglobin: 12.8 g/dL (ref 12.0–15.0)
Immature Granulocytes: 9 %
Lymphocytes Relative: 26 %
Lymphs Abs: 4.8 10*3/uL — ABNORMAL HIGH (ref 0.7–4.0)
MCH: 28 pg (ref 26.0–34.0)
MCHC: 32.3 g/dL (ref 30.0–36.0)
MCV: 86.7 fL (ref 80.0–100.0)
Monocytes Absolute: 1.7 10*3/uL — ABNORMAL HIGH (ref 0.1–1.0)
Monocytes Relative: 9 %
Neutro Abs: 10.1 10*3/uL — ABNORMAL HIGH (ref 1.7–7.7)
Neutrophils Relative %: 54 %
Platelet Count: 168 10*3/uL (ref 150–400)
RBC: 4.57 MIL/uL (ref 3.87–5.11)
RDW: 14.8 % (ref 11.5–15.5)
WBC Count: 18.6 10*3/uL — ABNORMAL HIGH (ref 4.0–10.5)
nRBC: 3.2 % — ABNORMAL HIGH (ref 0.0–0.2)

## 2019-10-01 MED ORDER — ROMIPLOSTIM INJECTION 500 MCG
790.0000 ug | Freq: Once | SUBCUTANEOUS | Status: AC
  Administered 2019-10-01: 790 ug via SUBCUTANEOUS
  Filled 2019-10-01: qty 1.08

## 2019-10-01 NOTE — Telephone Encounter (Signed)
Ms [name] her for lab and injection.  She asked for someone to look at her arm.  Her left forearm is red along the vein where her IV was placed this past Saturday.  Site is swollen and painful.  I instructed her to apply warm compresses 4 times a day and to call if not feeling better by tomorrow afternoon. She verbalized understanding.

## 2019-10-01 NOTE — Patient Instructions (Signed)
Romiplostim injection Qu es este medicamento? El ROMIPLOSTIM ayuda a que su cuerpo produzca ms plaquetas. Este medicamento se utiliza para tratar el nivel bajo de plaquetas causado por prpura trombocitopnica inmunitaria crnica (PTI). Este medicamento puede ser utilizado para otros usos; si tiene alguna pregunta consulte con su proveedor de atencin mdica o con su farmacutico. MARCAS COMUNES: Nplate Qu le debo informar a mi profesional de la salud antes de tomar este medicamento? Necesitan saber si usted presenta alguno de los siguientes problemas o situaciones: trastornos de sangrado problema de mdula sea, tales como cncer en la sangre o sndrome mielodisplsico antecedentes de cogulos sanguneos enfermedad heptica ciruga para extirpar el bazo una reaccin alrgica o inusual al romiplostim, al manitol, a otros medicamentos, alimentos, colorantes o conservantes si est embarazada o buscando quedar embarazada si est amamantando a un beb Cmo debo utilizar este medicamento? Este medicamento se administra mediante una inyeccin por va subcutnea. Lo administra un profesional de la salud en un hospital o en un entorno clnico. Le entregarn una Gua del medicamento especial (MedGuide, su nombre en ingls) antes de la inyeccin. Lea esta informacin cada vez cuidadosamente. Hable con su pediatra para informarse acerca del uso de este medicamento en nios. Aunque este medicamento se puede recetar a nios tan pequeos como de 1 ao de edad con ciertos padecimientos, existen precauciones que deben tomarse. Sobredosis: Pngase en contacto inmediatamente con un centro toxicolgico o una sala de urgencia si usted cree que haya tomado demasiado medicamento. ATENCIN: Este medicamento es solo para usted. No comparta este medicamento con nadie. Qu sucede si me olvido de una dosis? Es importante no olvidar ninguna dosis. Informe a su mdico o a su profesional de la salud si no puede asistir a  una cita. Qu puede interactuar con este medicamento? No se esperan interacciones. Puede ser que esta lista no menciona todas las posibles interacciones. Informe a su profesional de la salud de todos los productos a base de hierbas, medicamentos de venta libre o suplementos nutritivos que est tomando. Si usted fuma, consume bebidas alcohlicas o si utiliza drogas ilegales, indqueselo tambin a su profesional de la salud. Algunas sustancias pueden interactuar con su medicamento. A qu debo estar atento al usar este medicamento? Se supervisar su estado de salud atentamente mientras reciba este medicamento. Visite a su profesional que lo receta o a su profesional de la salud para chequear su evolucin peridicamente y para realizarse a los anlisis de sangre necesarios. Es importante asistir a todas sus citas. Qu efectos secundarios puedo tener al utilizar este medicamento? Efectos secundarios que debe informar a su mdico o a su profesional de la salud tan pronto como sea posible: reacciones alrgicas, como erupcin cutnea, comezn/picazn o urticaria, e hinchazn de la cara, los labios o la lengua signos y sntomas de sangrado, tales como heces con sangre o de color negro y aspecto alquitranado; orina de color rojo o marrn oscuro; escupir sangre o material marrn que tiene el aspecto de granos de caf molido; manchas rojas en la piel; sangrado o moretones inusuales en los ojos, las encas o la nariz signos y sntomas de un cogulo sanguneo, tales como dolor en el pecho; falta de aire; dolor, hinchazn o calor en la pierna signos y sntomas de un accidente cerebrovascular, tales como cambios en la visin; confusin; dificultad para hablar o entender; dolores de cabeza severos; entumecimiento o debilidad repentina de la cara, el brazo o la pierna; problemas al caminar; mareo; prdida del equilibrio o la coordinacin Efectos secundarios   que generalmente no requieren atencin mdica (infrmelos a su  mdico o a Producer, television/film/video de la salud si persisten o si son molestos): dolor de Public house manager en los brazos y las Programme researcher, broadcasting/film/video en la boca dolor estomacal Puede ser que esta lista no menciona todos los posibles efectos secundarios. Comunquese a su mdico por asesoramiento mdico [employer]. Usted puede informar los efectos secundarios a la FDA por telfono al 1-800-FDA-1088. Dnde debo guardar mi medicina? Este medicamento se administra en hospitales o clnicas y no necesitar guardarlo en su domicilio. ATENCIN: Este folleto es un resumen. Puede ser que no cubra toda la posible informacin. Si usted tiene preguntas acerca de esta medicina, consulte con su mdico, su farmacutico o su profesional de Radiographer, therapeutic.  2020 Elsevier/Gold Standard (2017-06-07 00:00:00)

## 2019-10-03 ENCOUNTER — Encounter: Payer: Self-pay | Admitting: Pharmacy Technician

## 2019-10-03 NOTE — Progress Notes (Signed)
Patient has been approved for drug assistance by [place] for Rituxan. The enrollment period is from 10/03/19-indefinitely based on self pay. First DOS covered is 10/08/19.

## 2019-10-06 NOTE — Progress Notes (Signed)
10/06/19  Patient enrolled in Drug Assistance program and was approved for Rituxan brand.  Plan has been updated to reflect Rituxan as drug to be administered.  Pryor Ochoa, PharmD

## 2019-10-07 ENCOUNTER — Inpatient Hospital Stay (HOSPITAL_COMMUNITY): Payer: Medicaid Other

## 2019-10-07 ENCOUNTER — Other Ambulatory Visit: Payer: Self-pay

## 2019-10-07 ENCOUNTER — Emergency Department (HOSPITAL_COMMUNITY): Payer: Medicaid Other

## 2019-10-07 ENCOUNTER — Encounter (HOSPITAL_COMMUNITY): Payer: Self-pay

## 2019-10-07 ENCOUNTER — Other Ambulatory Visit: Payer: Self-pay | Admitting: Oncology

## 2019-10-07 ENCOUNTER — Inpatient Hospital Stay (HOSPITAL_COMMUNITY)
Admission: EM | Admit: 2019-10-07 | Discharge: 2019-10-10 | DRG: 271 | Disposition: A | Discharge status: Home / Self Care | Payer: Medicaid Other | Attending: Family Medicine | Admitting: Family Medicine

## 2019-10-07 ENCOUNTER — Telehealth: Payer: Self-pay

## 2019-10-07 ENCOUNTER — Emergency Department (HOSPITAL_COMMUNITY)
Admission: EM | Admit: 2019-10-07 | Discharge: 2019-10-07 | Disposition: A | Discharge status: Home / Self Care | Payer: Self-pay | Attending: Emergency Medicine | Admitting: Emergency Medicine

## 2019-10-07 DIAGNOSIS — Z862 Personal history of diseases of the blood and blood-forming organs and certain disorders involving the immune mechanism: Secondary | ICD-10-CM | POA: Insufficient documentation

## 2019-10-07 DIAGNOSIS — I82432 Acute embolism and thrombosis of left popliteal vein: Secondary | ICD-10-CM | POA: Diagnosis present

## 2019-10-07 DIAGNOSIS — I824Y2 Acute embolism and thrombosis of unspecified deep veins of left proximal lower extremity: Secondary | ICD-10-CM

## 2019-10-07 DIAGNOSIS — M79605 Pain in left leg: Secondary | ICD-10-CM | POA: Insufficient documentation

## 2019-10-07 DIAGNOSIS — Z86718 Personal history of other venous thrombosis and embolism: Secondary | ICD-10-CM

## 2019-10-07 DIAGNOSIS — Z5321 Procedure and treatment not carried out due to patient leaving prior to being seen by health care provider: Secondary | ICD-10-CM | POA: Insufficient documentation

## 2019-10-07 DIAGNOSIS — D6959 Other secondary thrombocytopenia: Secondary | ICD-10-CM | POA: Diagnosis present

## 2019-10-07 DIAGNOSIS — Z20822 Contact with and (suspected) exposure to covid-19: Secondary | ICD-10-CM | POA: Diagnosis present

## 2019-10-07 DIAGNOSIS — Z9081 Acquired absence of spleen: Secondary | ICD-10-CM

## 2019-10-07 DIAGNOSIS — F32A Depression, unspecified: Secondary | ICD-10-CM

## 2019-10-07 DIAGNOSIS — Q969 Turner's syndrome, unspecified: Secondary | ICD-10-CM

## 2019-10-07 DIAGNOSIS — K219 Gastro-esophageal reflux disease without esophagitis: Secondary | ICD-10-CM | POA: Diagnosis present

## 2019-10-07 DIAGNOSIS — F329 Major depressive disorder, single episode, unspecified: Secondary | ICD-10-CM | POA: Diagnosis present

## 2019-10-07 DIAGNOSIS — R519 Headache, unspecified: Secondary | ICD-10-CM | POA: Diagnosis present

## 2019-10-07 DIAGNOSIS — I82409 Acute embolism and thrombosis of unspecified deep veins of unspecified lower extremity: Secondary | ICD-10-CM | POA: Diagnosis present

## 2019-10-07 DIAGNOSIS — I82422 Acute embolism and thrombosis of left iliac vein: Principal | ICD-10-CM | POA: Diagnosis present

## 2019-10-07 DIAGNOSIS — I82412 Acute embolism and thrombosis of left femoral vein: Secondary | ICD-10-CM | POA: Diagnosis present

## 2019-10-07 DIAGNOSIS — F418 Other specified anxiety disorders: Secondary | ICD-10-CM | POA: Diagnosis present

## 2019-10-07 DIAGNOSIS — B181 Chronic viral hepatitis B without delta-agent: Secondary | ICD-10-CM | POA: Diagnosis present

## 2019-10-07 DIAGNOSIS — Z8616 Personal history of COVID-19: Secondary | ICD-10-CM

## 2019-10-07 DIAGNOSIS — I871 Compression of vein: Secondary | ICD-10-CM | POA: Diagnosis present

## 2019-10-07 DIAGNOSIS — R7303 Prediabetes: Secondary | ICD-10-CM | POA: Diagnosis present

## 2019-10-07 DIAGNOSIS — Z833 Family history of diabetes mellitus: Secondary | ICD-10-CM

## 2019-10-07 DIAGNOSIS — M79609 Pain in unspecified limb: Secondary | ICD-10-CM

## 2019-10-07 DIAGNOSIS — Z803 Family history of malignant neoplasm of breast: Secondary | ICD-10-CM

## 2019-10-07 DIAGNOSIS — Z6841 Body Mass Index (BMI) 40.0 and over, adult: Secondary | ICD-10-CM

## 2019-10-07 DIAGNOSIS — D72829 Elevated white blood cell count, unspecified: Secondary | ICD-10-CM | POA: Insufficient documentation

## 2019-10-07 DIAGNOSIS — R600 Localized edema: Secondary | ICD-10-CM | POA: Diagnosis present

## 2019-10-07 DIAGNOSIS — M7989 Other specified soft tissue disorders: Secondary | ICD-10-CM | POA: Diagnosis present

## 2019-10-07 DIAGNOSIS — I82402 Acute embolism and thrombosis of unspecified deep veins of left lower extremity: Secondary | ICD-10-CM

## 2019-10-07 DIAGNOSIS — R42 Dizziness and giddiness: Secondary | ICD-10-CM | POA: Diagnosis present

## 2019-10-07 DIAGNOSIS — Z7901 Long term (current) use of anticoagulants: Secondary | ICD-10-CM

## 2019-10-07 DIAGNOSIS — M545 Low back pain: Secondary | ICD-10-CM | POA: Insufficient documentation

## 2019-10-07 DIAGNOSIS — Z79899 Other long term (current) drug therapy: Secondary | ICD-10-CM

## 2019-10-07 DIAGNOSIS — D693 Immune thrombocytopenic purpura: Secondary | ICD-10-CM | POA: Diagnosis present

## 2019-10-07 LAB — CBG MONITORING, ED: Glucose-Capillary: 116 mg/dL — ABNORMAL HIGH (ref 70–99)

## 2019-10-07 LAB — PROTIME-INR
INR: 1.1 (ref 0.8–1.2)
Prothrombin Time: 13.5 seconds (ref 11.4–15.2)

## 2019-10-07 LAB — I-STAT CHEM 8, ED
BUN: 17 mg/dL (ref 6–20)
Calcium, Ion: 1.2 mmol/L (ref 1.15–1.40)
Chloride: 103 mmol/L (ref 98–111)
Creatinine, Ser: 0.5 mg/dL (ref 0.44–1.00)
Glucose, Bld: 146 mg/dL — ABNORMAL HIGH (ref 70–99)
HCT: 40 % (ref 36.0–46.0)
Hemoglobin: 13.6 g/dL (ref 12.0–15.0)
Potassium: 4.1 mmol/L (ref 3.5–5.1)
Sodium: 139 mmol/L (ref 135–145)
TCO2: 24 mmol/L (ref 22–32)

## 2019-10-07 LAB — DIFFERENTIAL
Abs Immature Granulocytes: 0 10*3/uL (ref 0.00–0.07)
Basophils Absolute: 0.2 10*3/uL — ABNORMAL HIGH (ref 0.0–0.1)
Basophils Relative: 1 %
Eosinophils Absolute: 1 10*3/uL — ABNORMAL HIGH (ref 0.0–0.5)
Eosinophils Relative: 5 %
Lymphocytes Relative: 9 %
Lymphs Abs: 1.8 10*3/uL (ref 0.7–4.0)
Monocytes Absolute: 1 10*3/uL (ref 0.1–1.0)
Monocytes Relative: 5 %
Neutro Abs: 16 10*3/uL — ABNORMAL HIGH (ref 1.7–7.7)
Neutrophils Relative %: 80 %
nRBC: 2 /100 WBC — ABNORMAL HIGH

## 2019-10-07 LAB — I-STAT BETA HCG BLOOD, ED (MC, WL, AP ONLY): I-stat hCG, quantitative: <5 m[IU]/mL (ref <5)

## 2019-10-07 LAB — HEPARIN LEVEL (UNFRACTIONATED): Heparin Unfractionated: 0.31 IU/mL (ref 0.30–0.70)

## 2019-10-07 LAB — CBC
HCT: 39.1 % (ref 36.0–46.0)
Hemoglobin: 12.1 g/dL (ref 12.0–15.0)
MCH: 28.1 pg (ref 26.0–34.0)
MCHC: 30.9 g/dL (ref 30.0–36.0)
MCV: 90.7 fL (ref 80.0–100.0)
Platelets: 132 10*3/uL — ABNORMAL LOW (ref 150–400)
RBC: 4.31 MIL/uL (ref 3.87–5.11)
RDW: 14.9 % (ref 11.5–15.5)
WBC: 20 10*3/uL — ABNORMAL HIGH (ref 4.0–10.5)
nRBC: 1.3 % — ABNORMAL HIGH (ref 0.0–0.2)

## 2019-10-07 LAB — COMPREHENSIVE METABOLIC PANEL
ALT: 21 U/L (ref 0–44)
AST: 21 U/L (ref 15–41)
Albumin: 3.3 g/dL — ABNORMAL LOW (ref 3.5–5.0)
Alkaline Phosphatase: 69 U/L (ref 38–126)
Anion gap: 9 (ref 5–15)
BUN: 15 mg/dL (ref 6–20)
CO2: 22 mmol/L (ref 22–32)
Calcium: 9.3 mg/dL (ref 8.9–10.3)
Chloride: 105 mmol/L (ref 98–111)
Creatinine, Ser: 0.68 mg/dL (ref 0.44–1.00)
GFR calc Af Amer: >60 mL/min (ref >60)
GFR calc non Af Amer: >60 mL/min (ref >60)
Glucose, Bld: 147 mg/dL — ABNORMAL HIGH (ref 70–99)
Potassium: 4 mmol/L (ref 3.5–5.1)
Sodium: 136 mmol/L (ref 135–145)
Total Bilirubin: 0.5 mg/dL (ref 0.3–1.2)
Total Protein: 8.3 g/dL — ABNORMAL HIGH (ref 6.5–8.1)

## 2019-10-07 LAB — SARS CORONAVIRUS 2 BY RT PCR (HOSPITAL ORDER, PERFORMED IN ~~LOC~~ HOSPITAL LAB): SARS Coronavirus 2: NEGATIVE

## 2019-10-07 LAB — APTT: aPTT: 32 seconds (ref 24–36)

## 2019-10-07 MED ORDER — CITALOPRAM HYDROBROMIDE 20 MG PO TABS: 20.0000 mg | ORAL_TABLET | Freq: Every day | ORAL | Status: DC

## 2019-10-07 MED ORDER — OXYCODONE HCL 5 MG PO TABS
5.0000 mg | ORAL_TABLET | Freq: Four times a day (QID) | ORAL | Status: DC | PRN
  Administered 2019-10-07: 5 mg via ORAL
  Filled 2019-10-07: qty 1

## 2019-10-07 MED ORDER — HEPARIN (PORCINE) 25000 UT/250ML-% IV SOLN
1450.0000 [IU]/h | INTRAVENOUS | Status: DC
  Administered 2019-10-07: 1200 [IU]/h via INTRAVENOUS
  Filled 2019-10-07 (×2): qty 250

## 2019-10-07 MED ORDER — MECLIZINE HCL 25 MG PO TABS
25.0000 mg | ORAL_TABLET | Freq: Every day | ORAL | Status: DC | PRN
  Filled 2019-10-07: qty 1

## 2019-10-07 MED ORDER — MORPHINE SULFATE (PF) 4 MG/ML IV SOLN
4.0000 mg | Freq: Once | INTRAVENOUS | Status: AC
  Administered 2019-10-07: 4 mg via INTRAVENOUS
  Filled 2019-10-07: qty 1

## 2019-10-07 MED ORDER — IOHEXOL 350 MG/ML SOLN
100.0000 mL | Freq: Once | INTRAVENOUS | Status: AC | PRN
  Administered 2019-10-07: 100 mL via INTRAVENOUS

## 2019-10-07 MED ORDER — HYDROMORPHONE HCL 1 MG/ML IJ SOLN
0.5000 mg | INTRAMUSCULAR | Status: DC | PRN
  Administered 2019-10-07 – 2019-10-09 (×5): 0.5 mg via INTRAVENOUS
  Filled 2019-10-07 (×5): qty 1

## 2019-10-07 MED ORDER — PANTOPRAZOLE SODIUM 40 MG PO TBEC
40.0000 mg | DELAYED_RELEASE_TABLET | Freq: Every day | ORAL | Status: DC
  Administered 2019-10-07 – 2019-10-10 (×3): 40 mg via ORAL
  Filled 2019-10-07 (×3): qty 1

## 2019-10-07 MED ORDER — CITALOPRAM HYDROBROMIDE 20 MG PO TABS
10.0000 mg | ORAL_TABLET | Freq: Every day | ORAL | Status: DC
  Administered 2019-10-07 – 2019-10-10 (×3): 10 mg via ORAL
  Filled 2019-10-07 (×3): qty 1

## 2019-10-07 MED ORDER — CITALOPRAM HYDROBROMIDE 10 MG PO TABS: 10.0000 mg | ORAL_TABLET | ORAL | Status: DC

## 2019-10-07 MED ORDER — SODIUM CHLORIDE 0.9% FLUSH
3.0000 mL | Freq: Once | INTRAVENOUS | Status: AC
  Administered 2019-10-07: 3 mL via INTRAVENOUS

## 2019-10-07 MED ORDER — HEPARIN BOLUS VIA INFUSION
4000.0000 [IU] | Freq: Once | INTRAVENOUS | Status: AC
  Administered 2019-10-07: 4000 [IU] via INTRAVENOUS
  Filled 2019-10-07: qty 4000

## 2019-10-07 MED ORDER — ONDANSETRON HCL 4 MG/2ML IJ SOLN
4.0000 mg | Freq: Once | INTRAMUSCULAR | Status: AC
  Administered 2019-10-07: 4 mg via INTRAVENOUS
  Filled 2019-10-07: qty 2

## 2019-10-07 NOTE — ED Provider Notes (Signed)
Signout from Dr. Judd Lien.  44 year old female with history of ITP followed by Dr. Mosetta Putt.  She is complaining of swelling left side of her body primarily the left leg.  Her duplex is positive for extensive DVT.  Oncology is consulting on the patient.  Dr. Pascal Lux from vascular surgery is also consulting on the patient and requested a CT abdomen and pelvis venogram.  Patient will likely need to be admitted.  On a heparin drip. Physical Exam  BP 104/71   Pulse (!) 105   Temp 98.4 F (36.9 C) (Oral)   Resp 19   Ht 4\' 9"  (1.448 m)   Wt 111.6 kg   LMP 09/07/2019   SpO2 96%   BMI 53.23 kg/m   Physical Exam  ED Course/Procedures     Procedures  MDM  Dr. 09/09/2019 from vascular surgery evaluated patient and recommended that she be admitted to the medical service.  He will follow as a Randie Heinz.  Will likely need some sort of operative intervention.  Patient appears not to have a PCP, follows with Dr. Research scientist (medical) oncology for her ITP.  Discussed with Dr. Mosetta Putt [facility] hospitalist to evaluate the patient for admission.       Chipper Herb, MD 10/08/19 1102

## 2019-10-07 NOTE — Progress Notes (Signed)
ANTICOAGULATION CONSULT NOTE - Initial Consult  Pharmacy Consult for heparin Indication: pulmonary embolus  No Known Allergies  Patient Measurements: Height: 4\' 9"  (144.8 cm) Weight: 111.6 kg (246 lb) IBW/kg (Calculated) : 38.6 Heparin Dosing Weight: 73kg  Vital Signs: Temp: 98.4 F (36.9 C) (07/20 0639) Temp Source: Oral (07/20 0639) BP: 104/71 (07/20 1530) Pulse Rate: 105 (07/20 1530)  Labs: Recent Labs    10/07/19 0650 10/07/19 0700  HGB 12.1 13.6  HCT 39.1 40.0  PLT 132*  --   APTT 32  --   LABPROT 13.5  --   INR 1.1  --   CREATININE 0.68 0.50    Estimated Creatinine Clearance: 96.1 mL/min (by C-G formula based on SCr of 0.5 mg/dL).   Medical History: 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   Assessment: 31 YOF presenting with left leg pain and swelling, with acute DVT.  Not on anticoagulation PTA, H/H wnl, plts 132 (chronic thrombocytopenia on Nplate tx).    Goal of Therapy:  Heparin level 0.3-0.7 units/ml Monitor platelets by anticoagulation protocol: Yes   Plan:  Heparin 4000 units IV x 1 (low side d/t thrombocytopenia hx), and gtt at 1200 units/hr F/u 6 hour heparin level  58, PharmD Clinical Pharmacist ED Pharmacist Phone # [phone] 10/07/2019 4:05 PM

## 2019-10-07 NOTE — Consult Note (Addendum)
[place]  [phone] Fax:(336) [phone]  ID: Arlyn Dunning OB: 11-23-1975 MR#: [id number] YBO#:[id number] PCP: Truitt Merle, MD  CHIEF COMPLAINT: back pain that shoots down left leg  INTERVAL HISTORY: [name] is a 44 year old female originally from [place] who now resides in [place], [place].  She has a past medical history of recurrent ITP, history of small foci of subarachnoid hemorrhage secondary to ITP, headaches, history of COVID-19 in February 2021, chronic dizziness, hypotension.  The patient reports that she developed pain to her left arm following her IVIG infusion on 09/27/2019.  The pain has persisted and then she developed subsequent pain to her left leg with swelling.  She also reports some numbness to this area.  She presented to the emergency room for evaluation of her pain and numbness.  On admission, her WBC was 20.0 and platelet count was 132,000.  She had a Doppler ultrasound of her bilateral legs performed earlier today which showed no evidence of DVT on the right but did show findings consistent with an age-indeterminate DVT involving the left common femoral vein, SF junction, left femoral vein, left proximal profunda vein, left popliteal vein, left posterior tibial veins, left peroneal veins, and left gastrocnemius veins.  Vascular surgery has been consulted who recommends a CT venogram of the abdomen and pelvis and they will see the patient later today.  Hematology was asked see the patient to make recommendations regarding anticoagulation.  REVIEW OF SYSTEMS: When seen today, the patient's daughter is at the bedside.  She assists with translation.  The patient continues to have left arm pain and left leg pain and swelling.  She still has numbness on left side.  She reported that she had a headache the other day but none today.  She is not having any chest pain or shortness of breath.  Denies visual changes.  No dizziness  reported.  She denies epistaxis, hemoptysis, hematemesis, hematuria, melena, hematochezia.  The remainder of the review of systems is noncontributory.  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  She is married and has 6 children.  Denies history of alcohol tobacco use.  ADVANCED DIRECTIVES: Not in place  HEALTH MAINTENANCE: Social History   Tobacco Use   Smoking status: Never Smoker   Smokeless tobacco: Never Used  Vaping Use   Vaping Use: Never used  Substance Use Topics   Alcohol use: No    Alcohol/week: 0.0 standard drinks   Drug use: No   Colonoscopy: None PAP: Unsure of last Pap Bone density: None  No Known Allergies No current facility-administered medications for this encounter.   Current Outpatient Medications  Medication Sig Dispense Refill   acetaminophen (TYLENOL) 500 MG tablet Take 1-2 tablets (500-1,000 mg total) by mouth every 6 (six)  hours as needed for mild pain or moderate pain. (Patient taking differently: Take 1,000 mg by mouth daily as needed for mild pain or moderate pain. ) 30 tablet 0   citalopram (CELEXA) 10 MG tablet Take 10-20 mg by mouth See admin instructions. Take 12m x7days then increase to 269mstarted 10/01/19     HYDROcodone-acetaminophen (NORCO/VICODIN) 5-325 MG tablet Take 1 tablet by mouth every 4  (four) hours as needed for severe pain. 10 tablet 0   meclizine (ANTIVERT) 25 MG tablet Take 1 tablet (25 mg total) by mouth 3 (three) times daily as needed for dizziness. (Patient taking differently: Take 25 mg by mouth daily as needed for dizziness. ) 30 tablet 1   pantoprazole (PROTONIX) 40 MG tablet Take 1 tablet (40 mg total) by mouth daily. (Patient taking differently: Take 40 mg by mouth daily as needed (for heartburn). ) 30 tablet 0   dexamethasone (DECADRON) 4 MG tablet Take 5 tablets (20 mg total) by mouth daily. (Patient not taking: Reported on 10/07/2019) 20 tablet 0   OBJECTIVE: Vitals:   10/07/19 1215 10/07/19 1400  BP: 128/89 112/87  Pulse: (!) 107 95  Resp: (!) 22 (!) 23  Temp:    SpO2: 97% 97%   Body mass index is 53.23 kg/m.  General: Awake and alert, no distress Ocular: Sclerae unicteric, pupils equal, round and reactive to light Ear-nose-throat: Oropharynx clear, dentition fair Lymphatic: No cervical or supraclavicular adenopathy Lungs no rales or rhonchi, good excursion bilaterally Heart regular rate and rhythm, no murmur appreciated Abd soft, nontender, positive bowel sounds MSK no focal spinal tenderness, no joint edema Neuro: non-focal, well-oriented, appropriate affect Extremities: right lower extremity with no edema, left lower extremity with 2-3+ pitting edema extending to the mid thigh.  Reports pain to her left upper arm but no erythema or edema noted.  Right upper extremity without edema.   LAB RESULTS: CMP     Component Value Date/Time   NA 139 10/07/2019 0700   K 4.1 10/07/2019 0700   CL 103 10/07/2019 0700   CO2 22 10/07/2019 0650   GLUCOSE 146 (H) 10/07/2019 0700   BUN 17 10/07/2019 0700   CREATININE 0.50 10/07/2019 0700   CREATININE 0.84 08/11/2019 1329   CALCIUM 9.3 10/07/2019 0650   PROT 8.3 (H) 10/07/2019 0650   ALBUMIN 3.3 (L) 10/07/2019 0650   AST 21 10/07/2019 0650   AST 11 (L) 08/11/2019 1329   ALT 21 10/07/2019 0650   ALT 14  08/11/2019 1329   ALKPHOS 69 10/07/2019 0650   BILITOT 0.5 10/07/2019 0650   BILITOT 0.2 (L) 08/11/2019 1329   GFRNONAA >60 10/07/2019 0650   GFRNONAA >60 08/11/2019 1329   GFRAA >60 10/07/2019 0650   GFRAA >60 08/11/2019 1329   INo results found for: SPEP, UPEP Lab Results  Component Value Date   WBC 20.0 (H) 10/07/2019   NEUTROABS 16.0 (H) 10/07/2019   HGB 13.6 10/07/2019   HCT 40.0 10/07/2019   MCV 90.7 10/07/2019   PLT 132 (L) 10/07/2019   _0 @ No results found for: LABCA2 No components found for: LABCA125 Recent Labs  Lab 10/07/19 0650  INR 1.1   Urinalysis    Component Value Date/Time   COLORURINE YELLOW 11/16/2018 10[place]8/29/2020 1053   LABSPEC 1.020 11/16/2018 10[place].0 11/16/2018 10[place]8/29/2020 10[place]8/29/2020 10[place]8/29/2020 10[place] BILIRUBINUR neg 10/12/2015 081610  KETONESUR NEGATIVE 11/16/2018 [place] 11/16/2018 1053   UROBILINOGEN 0.2 10/12/2015 0852   UROBILINOGEN 0.2 10/02/2011 1133   NITRITE NEGATIVE 11/16/2018 1053   LEUKOCYTESUR SMALL (A) 11/16/2018 1053   STUDIES: CT HEAD WO CONTRAST  Result Date: 10/07/2019 CLINICAL DATA:  Provided history: Possible stroke; transient ischemic attack (TIA). Additional history provided: Pain in left arm, leg, left side of body, numbness, swelling of left leg, headache. EXAM: CT HEAD WITHOUT CONTRAST TECHNIQUE: Contiguous axial images were obtained from the base of the skull through the vertex without intravenous contrast. COMPARISON:  Prior head CT examinations 09/19/2019 and earlier, brain MRI 09/21/2015. FINDINGS: Brain: Cerebral volume is normal for age. There is no acute intracranial hemorrhage. No demarcated cortical infarct. No extra-axial fluid collection. No evidence of intracranial mass. No midline shift. Unchanged small scattered foci of parenchymal calcification Redemonstrated partially  empty sella turcica. Vascular: No hyperdense vessel. Skull: Normal. Negative for fracture or focal lesion. Sinuses/Orbits: Visualized orbits show no acute finding. Mild right maxillary sinus mucosal thickening. No significant mastoid effusion. IMPRESSION: No CT evidence of acute intracranial abnormality. Redemonstrated partially empty sella turcica. This is very commonly an incidental finding, but can be associated with idiopathic intracranial hypertension. Unchanged small nonspecific scattered parenchymal calcifications. Mild right maxillary sinus mucosal thickening. Electronically Signed   By: Kellie Simmering DO   On: 10/07/2019 07:35   CT Head Wo Contrast  Result Date: 09/19/2019 CLINICAL DATA:  Headache, low platelets, normal neuro exam. Additional provided: Patient reports headache which began yesterday. EXAM: CT HEAD WITHOUT CONTRAST TECHNIQUE: Contiguous axial images were obtained from the base of the skull through the vertex without intravenous contrast. COMPARISON:  Prior head CT 03/02/2019 FINDINGS: Brain: Cerebral volume is normal for age. There is no acute intracranial hemorrhage. No demarcated cortical infarct. No extra-axial fluid collection. No evidence of intracranial mass. No midline shift. Redemonstrated partially empty sella turcica. Vascular: No hyperdense vessel. Skull: Normal. Negative for fracture or focal lesion. Sinuses/Orbits: Visualized orbits show no acute finding. No significant paranasal sinus disease or mastoid effusion at the imaged levels. IMPRESSION: No CT evidence of acute intracranial abnormality. Redemonstrated partially empty sella turcica. This is very commonly an incidental finding, but can be associated with idiopathic intracranial hypertension. Unchanged small scattered nonspecific parenchymal calcifications. Electronically Signed   By: Kellie Simmering DO   On: 09/19/2019 18:57   VAS [place] LOWER EXTREMITY VENOUS (DVT) (ONLY [facility] & [facility])  Result Date: 10/07/2019  Lower Venous  DVTStudy Risk Factors: Chronic ITP. Limitations: Body habitus. Comparison Study: No prior studies. Performing Technologist: Darlin Coco  Examination Guidelines: A complete evaluation includes B-mode imaging, spectral Doppler, color Doppler, and power Doppler as needed of all accessible portions of each vessel. Bilateral testing is considered an integral part of a complete examination. Limited examinations for reoccurring indications may be performed as noted. The reflux portion of the exam is performed with the patient in reverse Trendelenburg.  +-----+---------------+---------+-----------+----------+--------------+ RIGHTCompressibilityPhasicitySpontaneityPropertiesThrombus Aging +-----+---------------+---------+-----------+----------+--------------+ CFV  Full           Yes      Yes                                 +-----+---------------+---------+-----------+----------+--------------+   +---------+---------------+---------+-----------+----------+-----------------+ LEFT     CompressibilityPhasicitySpontaneityPropertiesThrombus Aging    +---------+---------------+---------+-----------+----------+-----------------+ CFV      None  Age Indeterminate +---------+---------------+---------+-----------+----------+-----------------+ SFJ      None                                         Age Indeterminate +---------+---------------+---------+-----------+----------+-----------------+ FV Prox  None           No       Yes                  Age Indeterminate +---------+---------------+---------+-----------+----------+-----------------+ FV Mid   None           No       Yes                  Age Indeterminate +---------+---------------+---------+-----------+----------+-----------------+ FV DistalNone           No       Yes                  Age Indeterminate +---------+---------------+---------+-----------+----------+-----------------+ PFV       None           No       Yes                  Age Indeterminate +---------+---------------+---------+-----------+----------+-----------------+ POP      None           No       Yes                  Age Indeterminate +---------+---------------+---------+-----------+----------+-----------------+ PTV      None                                         Age Indeterminate +---------+---------------+---------+-----------+----------+-----------------+ PERO     None                                         Age Indeterminate +---------+---------------+---------+-----------+----------+-----------------+ Gastroc  None                                         Age Indeterminate +---------+---------------+---------+-----------+----------+-----------------+     Summary: RIGHT: - No evidence of common femoral vein obstruction.  LEFT: - Findings consistent with age indeterminate deep vein thrombosis involving the left common femoral vein, SF junction, left femoral vein, left proximal profunda vein, left popliteal vein, left posterior tibial veins, left peroneal veins, and left gastrocnemius veins. - No cystic structure found in the popliteal fossa. - Unable to evaluate extension of common femoral vein obstruction proximal to the inguinal ligament due to pannus/patient body habitus.  *See table(s) above for measurements and observations.    Preliminary    ASSESSMENT: 44 y.o. female from [place], [place] with (1) recurrent and refractory ITP  -Status post splenectomy  -Has previously received steroids, IVIG, Nplate, and Rituxan with the first episode.  -Developed subarachnoid hemorrhage in December 2019  -Has had multiple hospitalizations for acute ITP flare-last admission 08/04/2019 for platelet count less than  5000.  She was treated with IVIG and a short course of dexamethasone and high-dose Nplate at that time.  (2) left leg DVT 10/07/2019   PLAN: [name] has a  longstanding history of recurrent ITP.  Most recently, she received IVIG on 09/27/2019 and high dose of Nplate last given on 9/89/2119.  She was scheduled to begin rituximab on 10/08/2019.  She now presents to the hospital with left leg pain and numbness.  She was found to have an extensive DVT in the left leg.  Vascular surgery recommending a CT venogram of the abdomen and pelvis and will consult today.  Fortunately, her platelet count is over 100,000 and adequate to proceed with anticoagulation.  Recommend starting her on IV heparin.  We would recommend transitioning her to Coumadin.  She is tentatively scheduled to begin rituximab on 10/08/2019.  We can consider giving this as an inpatient for treatment of her ITP.  From a health maintenance perspective, she has not had a Pap smear or mammogram for many years.  Once she recovers from her acute issues, we can reach out to the [facility] clinic for referral so that she can obtain her mammogram and Pap smear.  We will reach out to our coordinator at this program to determine her eligibility.  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 She tells me she has had left pelvic discomfort for many years, though no clots before. It could be the aggressive use of romiplostim may have p[rovoked a hypercoagulable state, but the patient also is morbidly obese and there may be other ancillary causes.  We concur with vascular surgery's plan and Dr Burr Medico plans to transition the patient from romiplostim to rituximab, continuing IVIG as needed to maintain a safe platelet count.   Since her last IVIG dose was 09/27/2019 I anticipate she will have a platelet count >100K through next week, but would follow the platelet count daily and repeat IVIG if it drops below 100K  Dr Burr Medico will follow tomorrow  Appreciate your help to this patient!   I personally saw this  patient and performed a substantive portion of this encounter with the listed APP documented above.   Chauncey Cruel, MD Medical Oncology and Hematology [facility] [address] [place], [place] [address] Tel. [phone]    Fax. [phone]

## 2019-10-07 NOTE — Progress Notes (Signed)
ANTICOAGULATION CONSULT NOTE - Follow Up Consult  Pharmacy Consult for Heparin Indication: DVT  No Known Allergies  Patient Measurements: Height: 4\' 9"  (144.8 cm) Weight: 111.6 kg (246 lb) IBW/kg (Calculated) : 38.6 Heparin Dosing Weight: 73 kg  Vital Signs: BP: 122/78 (07/20 2000) Pulse Rate: 102 (07/20 2000)  Labs: Recent Labs    10/07/19 0650 10/07/19 0700 10/07/19 2149  HGB 12.1 13.6  --   HCT 39.1 40.0  --   PLT 132*  --   --   APTT 32  --   --   LABPROT 13.5  --   --   INR 1.1  --   --   HEPARINUNFRC  --   --  0.31  CREATININE 0.68 0.50  --     Estimated Creatinine Clearance: 96.1 mL/min (by C-G formula based on SCr of 0.5 mg/dL).   Medications:  Infusions:  . heparin 1,200 Units/hr (10/07/19 1626)    Assessment: 44 year of age patient diagnosed with acute DVT and started on IV Heparin therapy.   Heparin level is therapeutic at 0.31 after initial bolus and rate of 1200 units/hr (drawn about 5 hours after initiation). H/H is within normal limits. Note low platelets of 132 - chronic issue from ITP on Nplate treatment.    Goal of Therapy:  Heparin level 0.3-0.7 units/ml Monitor platelets by anticoagulation protocol: Yes   Plan:  Increase Heparin to 1300 units/hr (13 mL/hr) to keep in goal range. Follow-up AM level.    58, PharmD, BCPS, BCCCP Clinical Pharmacist Please refer to [facility] for [facility] numbers 10/07/2019,10:47 PM

## 2019-10-07 NOTE — Progress Notes (Signed)
Lower extremity venous LT study completed.   Preliminary results discussed with Delo, MD.   See Cv Proc for preliminary results.   [name]

## 2019-10-07 NOTE — ED Provider Notes (Signed)
[facility] EMERGENCY DEPARTMENT Provider Note   CSN: [id number] Arrival date & time: 10/07/19  4098     History Chief Complaint  Patient presents with  . Numbness    [name] is a 44 y.o. female.  Patient is a 44 year old female with past medical history of ITP followed by Dr. Mosetta Putt in the Oncology Clinic.  She is receiving infusion of IVIG for this.  Patient presents with a several day history of pain and swelling to the left side of her body, especially the left leg.  She denies to me she is having any fevers or chills.  She denies any chest pain or difficulty breathing.  Patient has had small subarachnoid hemorrhage in the past related to her ITP.  Patient also with obesity and borderline diabetes.  The history is provided by the patient.       Past Medical History:  Diagnosis Date  . Anemia   . Chronic ITP (idiopathic thrombocytopenia) (HCC)   . Depression   . Headache   . 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 Chronic headaches 08/28/2019  . Thrombocytopenia (HCC) 03/02/2019  . Borderline diabetes 10/16/2018  . Idiopathic thrombocytopenic purpura (ITP) (HCC) 10/14/2018  . Obesity, Class III, BMI 40-49.9 (morbid obesity) (HCC) 10/14/2018  . Hyperglycemia 10/14/2018  . 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 Chronic ITP (idiopathic thrombocytopenia) (HCC)   . Nontraumatic intracerebral hemorrhage (HCC)   . Menorrhagia with regular cycle   . Pressure ulcer 09/19/2015  . Gingival bleeding   . Chronic hepatitis B (HCC) 09/04/2015  . Subarachnoidal hemorrhage (HCC) 09/04/2015  . Severe thrombocytopenia (HCC) 09/04/2015  . Acute ITP (HCC) 08/28/2015    Past Surgical History:  Procedure Laterality Date  . BREAST SURGERY  biopsy  . 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 Diabetes Mother   . Breast cancer Mother     Social History   Tobacco Use  . Smoking status: Never Smoker  . Smokeless tobacco: Never Used  Vaping Use  . Vaping Use: Never used  Substance Use Topics  . Alcohol use: No    Alcohol/week: 0.0 standard drinks  . Drug use: No    Home Medications Prior to Admission medications   Medication Sig Start Date End Date Taking? Authorizing Provider  acetaminophen (TYLENOL) 500 MG tablet Take 1-2 tablets (500-1,000 mg total) by mouth every 6 (six) hours as needed for mild pain or moderate pain. Patient taking differently: Take 1,000 mg by mouth daily as needed for mild pain or moderate pain.  09/22/15   Burns, Tinnie Gens, MD  dexamethasone (DECADRON) 4 MG tablet Take 5 tablets (20 mg total) by mouth daily. 09/24/19   Malachy Mood, MD  HYDROcodone-acetaminophen (NORCO/VICODIN) 5-325 MG tablet Take 1 tablet by  mouth every 4 (four) hours as needed for severe pain. 09/19/19 09/18/20  Elson Areas, PA-C  meclizine (ANTIVERT) 25 MG tablet Take 1 tablet (25 mg total) by mouth 3 (three) times daily as needed for dizziness. Patient taking differently: Take 25 mg by mouth daily as needed for dizziness.  08/11/19   Malachy Mood, MD  pantoprazole (PROTONIX) 40 MG tablet Take 1 tablet (40 mg total) by mouth daily. Patient taking differently: Take 40 mg by mouth daily as needed (for heartburn).  08/06/19   Myrtis Ser, NP    Allergies    Patient has no known allergies.  Review of Systems   Review of Systems  All other systems reviewed and are negative.   Physical Exam Updated Vital Signs BP 109/74 (BP Location: Right Arm)   Pulse (!) 109   Temp 98.4 F (36.9 C) (Oral)    Resp 18   LMP 09/07/2019   SpO2 99%   Physical Exam Vitals and nursing note reviewed.  Constitutional:      General: She is not in acute distress.    Appearance: She is well-developed. She is not diaphoretic.  HENT:     Head: Normocephalic and atraumatic.  Cardiovascular:     Rate and Rhythm: Normal rate and regular rhythm.     Heart sounds: No murmur heard.  No friction rub. No gallop.   Pulmonary:     Effort: Pulmonary effort is normal. No respiratory distress.     Breath sounds: Normal breath sounds. No wheezing.  Abdominal:     General: Bowel sounds are normal. There is no distension.     Palpations: Abdomen is soft.     Tenderness: There is no abdominal tenderness.  Musculoskeletal:        General: Normal range of motion.     Cervical back: Normal range of motion and neck supple.     Left lower leg: Edema present.     Comments: The left lower extremity is noted to be edematous with 2-3+ pitting edema extending to the mid thigh.  DP pulses are palpable and motor and sensation intact throughout the entire foot.  Skin:    General: Skin is warm and dry.  Neurological:     Mental Status: She is alert and oriented to person, place, and time.     ED Results / Procedures / Treatments   Labs (all labs ordered are listed, but only abnormal results are displayed) Labs Reviewed  CBC - Abnormal; Notable for the following components:      Result Value   WBC 20.0 (*)    Platelets 132 (*)    nRBC 1.3 (*)    All other components within normal limits  DIFFERENTIAL - Abnormal; Notable for the following components:   Neutro Abs 16.0 (*)    Eosinophils Absolute 1.0 (*)    Basophils Absolute 0.2 (*)    nRBC 2 (*)    All other components within normal limits  COMPREHENSIVE METABOLIC PANEL - Abnormal; Notable for the following components:   Glucose, Bld 147 (*)    Total Protein 8.3 (*)    Albumin 3.3 (*)    All other components within normal limits  I-STAT CHEM 8, ED - Abnormal;  Notable for the following components:   Glucose, Bld 146 (*)    All other components within normal limits  PROTIME-INR  APTT  I-STAT BETA HCG BLOOD, ED ([facility], [facility], [facility] ONLY)  CBG MONITORING, ED    EKG None  Radiology CT HEAD WO CONTRAST  Result Date: 10/07/2019 CLINICAL DATA:  Provided history: Possible stroke; transient ischemic attack (TIA). Additional history provided: Pain in left arm, leg, left side of body, numbness, swelling of left leg, headache. EXAM: CT HEAD WITHOUT CONTRAST TECHNIQUE: Contiguous axial images were obtained from the base of the skull through the vertex without intravenous contrast. COMPARISON:  Prior head CT examinations 09/19/2019 and earlier, brain MRI 09/21/2015. FINDINGS: Brain: Cerebral volume is normal for age. There is no acute intracranial hemorrhage. No demarcated cortical infarct. No extra-axial fluid collection. No evidence of intracranial mass. No midline shift. Unchanged small scattered foci of parenchymal calcification Redemonstrated partially empty sella turcica. Vascular: No hyperdense vessel. Skull: Normal. Negative for fracture or focal lesion. Sinuses/Orbits: Visualized orbits show no acute finding. Mild right maxillary sinus mucosal thickening. No significant mastoid effusion. IMPRESSION: No CT evidence of acute intracranial abnormality. Redemonstrated partially empty sella turcica. This is very commonly an incidental finding, but can be associated with idiopathic intracranial hypertension. Unchanged small nonspecific scattered parenchymal calcifications. Mild right maxillary sinus mucosal thickening. Electronically Signed   By: Jackey Loge DO   On: 10/07/2019 07:35    Procedures Procedures (including critical care time)  Medications Ordered in ED Medications  sodium chloride flush (NS) 0.9 % injection 3 mL (has no administration in time range)    ED Course  I have reviewed the triage vital signs and the nursing notes.  Pertinent labs & imaging  results that were available during my care of the patient were reviewed by me and considered in my medical decision making (see chart for details).    MDM Rules/Calculators/A&P  Patient is a 44 year old female with history of ITP receiving IVIG infusions and chemotherapeutic agents as per Dr. Mosetta Putt.  Patient presents here with complaints of pain and swelling in her left leg and left side of her body.  Patient does have significant edema of the left leg, but does have palpable pulses.  Her work-up confirms DVT.  There is clot involving the venous system of the entire leg.  Imaging of the iliac was difficult secondary to body habitus, but I am told the IVC was without clot.  I discussed the patient's care with Dr. Darnelle Catalan from oncology.  Upon reviewing her record, he feels as though her clot can be treated safely with standard anticoagulants.  Patient will be started on heparin.  I have also spoken with Dr. Randie Heinz from vascular surgery.  He is recommending a CT venogram of the abdomen and pelvis be obtained and will evaluate the patient once the study is complete.  Final Clinical Impression(s) / ED Diagnoses Final diagnoses:  None    Rx / DC Orders ED Discharge Orders    None       Geoffery Lyons, MD 10/07/19 1535

## 2019-10-07 NOTE — Consult Note (Signed)
Hospital Consult    Reason for Consult: Extensive left lower extremity DVT Referring Physician: Dr. Judd Lien MRN #:  [id number]  History of Present Illness: This is a 44 y.o. female history of ITP for which she is followed by Dr. Mosetta Putt here in [place].  She has been receiving IVIG infusions for this.  Now has several day history of left lower extremity swelling and pain particularly left lower extremity.  Underwent lower extremity duplex ultrasound which demonstrated extensive left lower extremity DVT.  She denies previous history of DVT.  Platelets have been stable.  Has previous splenectomy no other previous surgeries.  Does not have any other extremity swelling at this time.  Past Medical History:  Diagnosis Date  . Anemia   . Chronic ITP (idiopathic thrombocytopenia) (HCC)   . Depression   . Headache   . 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 BREAST SURGERY  biopsy  . 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 Authorizing Provider  acetaminophen (TYLENOL) 500 MG tablet Take 1-2 tablets (500-1,000 mg total) by mouth every 6 (six) hours as needed for mild pain or moderate pain. Patient taking differently: Take 1,000 mg by mouth daily as needed for mild pain or moderate pain.  09/22/15  Yes Burns, Alexa R, MD  citalopram (CELEXA) 10 MG tablet Take 10-20 mg by mouth See admin instructions. Take 10mg  x7days then increase to 20mg  started 10/01/19 10/01/19  Yes [provider]  HYDROcodone-acetaminophen (NORCO/VICODIN) 5-325 MG tablet Take 1 tablet by mouth every 4 (four) hours as needed for severe pain. 09/19/19 09/18/20 Yes 11/20/19, PA-C  meclizine (ANTIVERT) 25 MG tablet  Take 1 tablet (25 mg total) by mouth 3 (three) times daily as needed for dizziness. Patient taking differently: Take 25 mg by mouth daily as needed for dizziness.  08/11/19  Yes Elson Areas, MD  pantoprazole (PROTONIX) 40 MG tablet Take 1 tablet (40 mg total) by mouth daily. Patient taking differently: Take 40 mg by mouth daily as needed (for heartburn).  08/06/19  Yes Curcio, Malachy Mood, NP  dexamethasone (DECADRON) 4 MG tablet Take 5 tablets (20 mg total) by mouth daily. Patient not taking: Reported on 10/07/2019 09/24/19   10/09/2019, MD    Social History   Socioeconomic History  . Marital status: Married    Spouse name: Not on file  . Number of children: Not on file  . Years of education: Not on file  . Highest education level: Not on file  Occupational History  . Occupation: packs [employer]  Tobacco Use  . Smoking status: Never Smoker  . Smokeless tobacco: Never Used  Vaping Use  . Vaping Use: Never used  Substance and Sexual Activity  . Alcohol use: No    Alcohol/week: 0.0 standard drinks  . Drug use: No  . Sexual activity: Not on file  Other Topics Concern  . Not on file  Social History Narrative  . Not on file   Social Determinants of Health   Financial Resource Strain:   . Difficulty of Paying Living Expenses:   Food Insecurity:   . Worried About 11/25/19 in the Last Year:   .  Ran Out of Food in the Last Year:   Transportation Needs:   . Freight forwarder (Medical):   Marland Kitchen Lack of Transportation (Non-Medical):   Physical Activity:   . Days of Exercise per Week:   . Minutes of Exercise per Session:   Stress:   . Feeling of Stress :   Social Connections:   . Frequency of Communication with Friends and Family:   . Frequency of Social Gatherings with Friends and Family:   . Attends Religious Services:   . Active Member of Clubs or Organizations:   . Attends Banker Meetings:   Marland Kitchen Marital Status:   Intimate Partner Violence:   . Fear  of Current or Ex-Partner:   . Emotionally Abused:   Marland Kitchen Physically Abused:   . Sexually Abused:      Family History  Problem Relation Age of Onset  . Diabetes Mother   . Breast cancer Mother     ROS:  Cardiovascular: []  chest pain/pressure []  palpitations []  SOB lying flat []  DOE []  pain in legs while walking [x]  pain in legs at rest []  pain in legs at night []  non-healing ulcers []  hx of DVT [x]  swelling in legs  Pulmonary: []  productive cough []  asthma/wheezing []  home O2  Neurologic: []  weakness in []  arms []  legs []  numbness in []  arms []  legs []  hx of CVA []  mini stroke [] difficulty speaking or slurred speech []  temporary loss of vision in one eye []  dizziness  Hematologic: []  hx of cancer []  bleeding problems []  problems with blood clotting easily  Endocrine:   []  diabetes []  thyroid disease  GI []  vomiting blood []  blood in stool  GU: []  CKD/renal failure []  HD--[]  M/W/F or []  T/T/S []  burning with urination []  blood in urine  Psychiatric: []  anxiety []  depression  Musculoskeletal: []  arthritis []  joint pain  Integumentary: []  rashes []  ulcers  Constitutional: []  fever []  chills   Physical Examination  Vitals:   10/07/19 1215 10/07/19 1400  BP: 128/89 112/87  Pulse: (!) 107 95  Resp: (!) 22 (!) 23  Temp:    SpO2: 97% 97%   There is no height or weight on file to calculate BMI.  General:  Nad, obese HENT: WNL, normocephalic Pulmonary: normal non-labored breathing Cardiac: Palpable dorsalis pedis pulses bilaterally Abdomen:  soft, NT/ND, no masses, well-healed left upper quadrant incision Extremities: swelling lle both lower extremities appear well-perfused Musculoskeletal: no muscle wasting or atrophy  Neurologic: A&O X 3; Appropriate Affect ; SENSATION: normal; MOTOR FUNCTION:  moving all extremities equally. Speech is fluent/normal   CBC    Component Value Date/Time   WBC 20.0 (H) 10/07/2019 0650   RBC 4.31  10/07/2019 0650   HGB 13.6 10/07/2019 0700   HGB 12.8 10/01/2019 0838   HCT 40.0 10/07/2019 0700   HCT 27.3 (L) 09/21/2015 0900   PLT 132 (L) 10/07/2019 0650   PLT 168 10/01/2019 0838   MCV 90.7 10/07/2019 0650   MCH 28.1 10/07/2019 0650   MCHC 30.9 10/07/2019 0650   RDW 14.9 10/07/2019 0650   LYMPHSABS 1.8 10/07/2019 0650   MONOABS 1.0 10/07/2019 0650   EOSABS 1.0 (H) 10/07/2019 0650   BASOSABS 0.2 (H) 10/07/2019 0650    BMET    Component Value Date/Time   NA 139 10/07/2019 0700   K 4.1 10/07/2019 0700   CL 103 10/07/2019 0700   CO2 22 10/07/2019 0650   GLUCOSE 146 (H) 10/07/2019 0700   BUN 17  10/07/2019 0700   CREATININE 0.50 10/07/2019 0700   CREATININE 0.84 08/11/2019 1329   CALCIUM 9.3 10/07/2019 0650   GFRNONAA >60 10/07/2019 0650   GFRNONAA >60 08/11/2019 1329   GFRAA >60 10/07/2019 0650   GFRAA >60 08/11/2019 1329    COAGS: Lab Results  Component Value Date   INR 1.1 10/07/2019   INR 1.1 04/08/2019   INR 1.0 03/02/2019     Non-Invasive Vascular Imaging:   Lower extremity duplex US Summary:  RIGHT:  - No evidence of common femoral vein obstruction.    LEFT:  - Findings consistent with age indeterminate deep vein thrombosis  involving the left common femoral vein, SF junction, left femoral vein,  left proximal profunda vein, left popliteal vein, left posterior tibial  veins, left peroneal veins, and left  gastrocnemius veins.  - No cystic structure found in the popliteal fossa.  - Unable to evaluate extension of common femoral vein obstruction proximal  to the inguinal ligament due to pannus/patient body habitus.   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   ASSESSMENT/PLAN: This is a 44 y.o. 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  I discussed this case with the emergency  department as well as oncology on call team.  She merits admission to the hospital with heparin drip.  We will plan for left lower extremity venography with possible mechanical thrombectomy and possible stenting for May Thurner syndrome tomorrow in the [facility] lab.  She will need to be n.p.o. past midnight.  We will need to avoid lysis given previous SAH.  Kamalani Mastro C. Randie Heinz, MD [facility] Office: [phone] Pager: [phone]

## 2019-10-07 NOTE — H&P (Signed)
History and Physical    [name] YNW:[id number] DOB: [birth year] DOA: 10/07/2019  PCP: Malachy Mood, MD (Confirm with patient/family/NH records and if not entered, this has to be entered at [facility] point of entry) Patient coming from: Home  I have personally briefly reviewed patient's old medical records in [facility]  Chief Complaint: Left sided swelling  HPI: [name] is a 44 y.o. female with medical history significant of ITP who recently started on IVIG infusion now status post her 2nd dosage, recent COVID-19 infection in February 2021, GERD, depression, presented with sudden onset of left leg and left arm swelling.  Her last IVIG infusion was on 09/27/2019.  Starting from same night, she started to feel left arm heaviness and pain, over the last week her symptoms progressed to involve the left leg, now given standing up caused significant pain on tolerable.  Denies any fever chills, no chest pain no coughing up blood, no overt bleeding or dark-colored stool, no abdominal pain. ED Course: WBC 20, platelet count 132, Doppler showed left leg DVT age indeterminate with left femoral vein and left common femoral vein.  Review of Systems: As per HPI otherwise 10 point review of systems negative.    Past Medical History:  Diagnosis Date  . Anemia   . Chronic ITP (idiopathic thrombocytopenia) (HCC)   . Depression   . Headache   . 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 BREAST SURGERY  biopsy  . 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 She has never used smokeless tobacco. She reports that she does not drink alcohol and does not use drugs.  No Known Allergies  Family History  Problem Relation Age of Onset  .  Diabetes Mother   . Breast cancer Mother      Prior to Admission medications   Medication Sig Start Date End Date Taking? Authorizing Provider  acetaminophen (TYLENOL) 500 MG tablet Take 1-2 tablets (500-1,000 mg total) by mouth every 6 (six) hours as needed for mild pain or moderate pain. Patient taking differently: Take 1,000 mg by mouth daily as needed for mild pain or moderate pain.  09/22/15  Yes Burns, Alexa R, MD  citalopram (CELEXA) 10 MG tablet Take 10-20 mg by mouth See admin instructions. Take  x7days then increase to  started 10/01/19 10/01/19  Yes [provider]  HYDROcodone-acetaminophen (NORCO/VICODIN) 5-325 MG tablet Take 1 tablet by mouth every 4 (four) hours as needed for severe pain. 09/19/19 09/18/20 Yes Elson Areas, PA-C  meclizine (ANTIVERT) 25 MG tablet Take 1 tablet (25 mg total) by mouth 3 (three) times daily as needed for dizziness. Patient taking differently: Take 25 mg by mouth daily as needed for dizziness.  08/11/19  Yes Malachy Mood, MD  pantoprazole (PROTONIX) 40 MG tablet Take 1 tablet (40 mg total) by mouth daily. Patient taking differently: Take 40 mg by mouth daily as needed (for heartburn).  08/06/19  Yes Curcio, Reita May, NP  dexamethasone (DECADRON) 4 MG tablet Take 5 tablets (20 mg total) by mouth daily. Patient not taking: Reported on 10/07/2019 09/24/19   Malachy Mood, MD    Physical Exam: Vitals:   10/07/19 1400 10/07/19 1439 10/07/19 1500 10/07/19 1530  BP: 112/87  111/65 104/71  Pulse: 95  (!) 106 (!) 105  Resp: (!) 23  (!) 22 19  Temp:      TempSrc:      SpO2: 97%  98% 96%  Weight:  111.6 kg    Height:  4\' 9"  (1.448 m)      Constitutional: NAD, calm, comfortable Vitals:   10/07/19 1400 10/07/19 1439 10/07/19 1500 10/07/19 1530  BP: 112/87  111/65 104/71  Pulse: 95  (!) 106 (!) 105  Resp: (!) 23  (!) 22 19  Temp:      TempSrc:      SpO2: 97%  98% 96%  Weight:  111.6 kg    Height:  4\' 9"  (1.448 m)     Eyes: PERRL, lids and  conjunctivae normal ENMT: Mucous membranes are moist. Posterior pharynx clear of any exudate or lesions.Normal dentition.  Neck: normal, supple, no masses, no thyromegaly Respiratory: clear to auscultation bilaterally, no wheezing, no crackles. Normal respiratory effort. No accessory muscle use.  Cardiovascular: Regular rate and rhythm, no murmurs / rubs / gallops. 1+ extremity edema. 2+ pedal pulses. No carotid bruits.  Abdomen: no tenderness, no masses palpated. No hepatosplenomegaly. Bowel sounds positive.  Musculoskeletal: no clubbing / cyanosis. No joint deformity upper and lower extremities. Good ROM, no contractures. Normal muscle tone.  Left arm and left leg tenderness, pain on passive maneuver Skin: no rashes, lesions, ulcers. No induration Neurologic: CN 2-12 grossly intact. Sensation intact, DTR normal. Strength 5/5 in all 4.  Psychiatric: Normal judgment and insight. Alert and oriented x 3. Normal mood.     Labs on Admission: I have personally reviewed following labs and imaging studies  CBC: Recent Labs  Lab 10/01/19 0838 10/07/19 0650 10/07/19 0700  WBC 18.6* 20.0*  --   NEUTROABS 10.1* 16.0*  --   HGB 12.8 12.1 13.6  HCT 39.6 39.1 40.0  MCV 86.7 90.7  --   PLT 168 132*  --    Basic Metabolic Panel: Recent Labs  Lab 10/07/19 0650 10/07/19 0700  NA 136 139  K 4.0 4.1  CL 105 103  CO2 22  --   GLUCOSE 147* 146*  BUN 15 17  CREATININE 0.68 0.50  CALCIUM 9.3  --    GFR: Estimated Creatinine Clearance: 96.1 mL/min (by C-G formula based on SCr of 0.5 mg/dL). Liver Function Tests: Recent Labs  Lab 10/07/19 0650  AST 21  ALT 21  ALKPHOS 69  BILITOT 0.5  PROT 8.3*  ALBUMIN 3.3*   No results for input(s): LIPASE, AMYLASE in the last 168 hours. No results for input(s): AMMONIA in the last 168 hours. Coagulation Profile: Recent Labs  Lab 10/07/19 0650  INR 1.1   Cardiac Enzymes: No results for input(s): CKTOTAL, CKMB, CKMBINDEX, TROPONINI in the last  168 hours. BNP (last 3 results) No results for input(s): PROBNP in the last 8760 hours. HbA1C: No results for input(s): HGBA1C in the last 72 hours. CBG: Recent Labs  Lab 10/07/19 1153  GLUCAP 116*   Lipid Profile: No results for input(s): CHOL, HDL, LDLCALC, TRIG, CHOLHDL, LDLDIRECT in the last 72 hours. Thyroid Function Tests: No results for input(s): TSH, T4TOTAL, FREET4, T3FREE, THYROIDAB in the last 72 hours. Anemia Panel: No results for input(s): VITAMINB12, FOLATE, FERRITIN, TIBC, IRON, RETICCTPCT in the last 72 hours. Urine analysis:    Component Value Date/Time   COLORURINE YELLOW 11/16/2018 1053   APPEARANCEUR CLEAR 11/16/2018 1053   LABSPEC 1.020 11/16/2018 1053   PHURINE 6.0  11/16/2018 1053   GLUCOSEU NEGATIVE 11/16/2018 1053   HGBUR NEGATIVE 11/16/2018 1053   BILIRUBINUR NEGATIVE 11/16/2018 1053   BILIRUBINUR neg 10/12/2015 0852   KETONESUR NEGATIVE 11/16/2018 1053   PROTEINUR NEGATIVE 11/16/2018 1053   UROBILINOGEN 0.2 10/12/2015 0852   UROBILINOGEN 0.2 10/02/2011 1133   NITRITE NEGATIVE 11/16/2018 1053   LEUKOCYTESUR SMALL (A) 11/16/2018 1053    Radiological Exams on Admission: CT HEAD WO CONTRAST  Result Date: 10/07/2019 CLINICAL DATA:  Provided history: Possible stroke; transient ischemic attack (TIA). Additional history provided: Pain in left arm, leg, left side of body, numbness, swelling of left leg, headache. EXAM: CT HEAD WITHOUT CONTRAST TECHNIQUE: Contiguous axial images were obtained from the base of the skull through the vertex without intravenous contrast. COMPARISON:  Prior head CT examinations 09/19/2019 and earlier, brain MRI 09/21/2015. FINDINGS: Brain: Cerebral volume is normal for age. There is no acute intracranial hemorrhage. No demarcated cortical infarct. No extra-axial fluid collection. No evidence of intracranial mass. No midline shift. Unchanged small scattered foci of parenchymal calcification Redemonstrated partially empty sella  turcica. Vascular: No hyperdense vessel. Skull: Normal. Negative for fracture or focal lesion. Sinuses/Orbits: Visualized orbits show no acute finding. Mild right maxillary sinus mucosal thickening. No significant mastoid effusion. IMPRESSION: No CT evidence of acute intracranial abnormality. Redemonstrated partially empty sella turcica. This is very commonly an incidental finding, but can be associated with idiopathic intracranial hypertension. Unchanged small nonspecific scattered parenchymal calcifications. Mild right maxillary sinus mucosal thickening. Electronically Signed   By: Jackey Loge DO   On: 10/07/2019 07:35   VAS [place] LOWER EXTREMITY VENOUS (DVT) (ONLY [facility] & [facility])  Result Date: 10/07/2019  Lower Venous DVTStudy Indications: Pain.  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 Limitations: Body habitus. Comparison Study: No prior studies. Performing Technologist: Jean Rosenthal  Examination Guidelines: A complete evaluation includes B-mode imaging, spectral Doppler, color Doppler, and power Doppler as needed of all accessible portions of each vessel. Bilateral testing is considered an integral part of a complete examination. Limited examinations for reoccurring indications may be performed as noted. The reflux portion of the exam is performed with the patient in reverse Trendelenburg.  +-----+---------------+---------+-----------+----------+--------------+ RIGHTCompressibilityPhasicitySpontaneityPropertiesThrombus Aging +-----+---------------+---------+-----------+----------+--------------+ CFV  Full           Yes      Yes                                 +-----+---------------+---------+-----------+----------+--------------+   +---------+---------------+---------+-----------+----------+-----------------+ LEFT     CompressibilityPhasicitySpontaneityPropertiesThrombus Aging    +---------+---------------+---------+-----------+----------+-----------------+ CFV      None                                          Age Indeterminate +---------+---------------+---------+-----------+----------+-----------------+ SFJ      None                                         Age Indeterminate +---------+---------------+---------+-----------+----------+-----------------+ FV Prox  None           No       Yes                  Age Indeterminate +---------+---------------+---------+-----------+----------+-----------------+ FV Mid   None           No  Yes                  Age Indeterminate +---------+---------------+---------+-----------+----------+-----------------+ FV DistalNone           No       Yes                  Age Indeterminate +---------+---------------+---------+-----------+----------+-----------------+ PFV      None           No       Yes                  Age Indeterminate +---------+---------------+---------+-----------+----------+-----------------+ POP      None           No       Yes                  Age Indeterminate +---------+---------------+---------+-----------+----------+-----------------+ PTV      None                                         Age Indeterminate +---------+---------------+---------+-----------+----------+-----------------+ PERO     None                                         Age Indeterminate +---------+---------------+---------+-----------+----------+-----------------+ Gastroc  None                                         Age Indeterminate +---------+---------------+---------+-----------+----------+-----------------+     Summary: RIGHT: - No evidence of common femoral vein obstruction.  LEFT: - Findings consistent with age indeterminate deep vein thrombosis involving the left common femoral vein, SF junction, left femoral vein, left proximal profunda vein, left popliteal vein, left posterior tibial veins, left peroneal veins, and left gastrocnemius veins. - No cystic structure found in the popliteal fossa. - Unable to evaluate  extension of common femoral vein obstruction proximal to the inguinal ligament due to pannus/patient body habitus.  *See table(s) above for measurements and observations.    Preliminary     EKG: Independently reviewed.  Sinus tachycardia  Assessment/Plan Active Problems:   DVT (deep venous thrombosis) (HCC)  (please populate well all problems here in Problem List. (For example, if patient is on BP meds at home and you resume or decide to hold them, it is a problem that needs to be her. Same for CAD, COPD, HLD and so on)  Acute DVT -Probably can move both left arm and left leg.  Although the DVT study showed region intermittent finding, however given the patient's symptoms started on acute basis, likely represent acute process.  Given the treatment will be the same, will not perform arm DVT study. -Vascular surgery also consulted, recommend conservative management and CT venogram which is being done.  Plan is if heparin therapy not effective, will consider thrombectomy. -Heparin drip, hematology on board and will see the patient soon.  Leukocytosis -Probably reactive, check chest x-ray and UA.  Monitor off antibiotics  ITP -Her platelet level has been stabilized.  No central signs of bleeding. -Further work-up deferred to hematologist  GERD -Continue PPI  Anxiety/depression -Continue SSRI  DVT prophylaxis: Heparin drip  code Status: Full Code Family Communication: Daughter at bedside Disposition Plan: Patient  has complicated medical problem ITP with thromboembolic event, may need surgical intervention likely will need more than 2 midnight hospital stay Consults called: Hematology and vascular surgery Admission status: MedSurg   Emeline GeneralPing T Kayley Zeiders MD [facility] Pager [phone]  10/07/2019, 5:17 PM

## 2019-10-07 NOTE — Telephone Encounter (Signed)
-----   Message from Malachy Mood, MD sent at 10/02/2019  8:04 AM EDT ----- Let pt know her PLT improved and back to normal yesterday, thanks   Malachy Mood

## 2019-10-07 NOTE — Telephone Encounter (Signed)
-----   Message from [name] Feng, MD sent at 10/02/2019  8:04 AM EDT ----- Let pt know her PLT improved and back to normal yesterday, thanks   [name] Feng   

## 2019-10-07 NOTE — ED Notes (Signed)
Dinner Tray Ordered @ 1736. 

## 2019-10-07 NOTE — Telephone Encounter (Signed)
LM to cb for lab results

## 2019-10-07 NOTE — Progress Notes (Deleted)
[facility]   [phone] Fax:([phone]) [phone]   Clinic Follow up Note   Patient Care Team: Malachy Mood, MD as PCP - General (Hematology) Levert Feinstein, MD as Consulting Physician (Oncology) [name] (Hematology and Oncology) Elwin Mocha, MD as Consulting Physician (Ophthalmology) Justice Deeds, MD (Internal Medicine) 10/07/2019  CHIEF COMPLAINT: Follow-up ITP  CURRENT THERAPY:  -Promacta 25mg  daily starting 10/22/18. Stopped 04/08/18 due to recurrent flares.  -Nplateq2weeks starting in 2 weeks. Increased to weekly on 08/11/19 due to recurrent ITP flares.Willgive her 2 mcg/kg if platelet normal, and increase to 3 mcg/kg if platelet less than 150K. Increased to 59mcg/kg starting 09/03/19.Increased to 18mcg/kg on 09/10/19 and increase again to 67mcg/kg starting 09/17/19. -PENDING Rituxan weekly X4   INTERVAL HISTORY: Ms. 09/19/19 returns for follow-up as scheduled.  She received IVIG on 09/27/2019.  She is here to begin Rituxan.   REVIEW OF SYSTEMS:   Constitutional: Denies fevers, chills or abnormal weight loss Eyes: Denies blurriness of vision Ears, nose, mouth, throat, and face: Denies mucositis or sore throat Respiratory: Denies cough, dyspnea or wheezes Cardiovascular: Denies palpitation, chest discomfort or lower extremity swelling Gastrointestinal:  Denies nausea, heartburn or change in bowel habits Skin: Denies abnormal skin rashes Lymphatics: Denies new lymphadenopathy or easy bruising Neurological:Denies numbness, tingling or new weaknesses Behavioral/Psych: Mood is stable, no new changes  All other systems were reviewed with the patient and are negative.  MEDICAL HISTORY:  Past Medical History:  Diagnosis Date  . Anemia   . Chronic ITP (idiopathic thrombocytopenia) (HCC)   . Depression   . Headache   . 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    SURGICAL HISTORY: Past  Surgical History:  Procedure Laterality Date  . BREAST SURGERY  biopsy  . 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  ALLERGIES:  has No Known Allergies.  MEDICATIONS:  No current facility-administered medications for this visit.   Current Outpatient Medications  Medication Sig Dispense Refill  . acetaminophen (TYLENOL) 500 MG tablet Take 1-2 tablets (500-1,000 mg total) by mouth every 6 (six) hours as needed for mild pain or moderate pain. (Patient taking differently: Take 1,000 mg by mouth daily as needed for mild pain or moderate pain. ) 30 tablet 0  . citalopram (CELEXA) 10 MG tablet Take 10-20 mg by mouth See admin instructions. Take 10mg  x7days then increase to 20mg  started 10/01/19    . dexamethasone (DECADRON) 4 MG tablet Take 5 tablets (20 mg total) by mouth daily. (Patient not taking: Reported on 10/07/2019) 20 tablet 0  . HYDROcodone-acetaminophen (NORCO/VICODIN) 5-325 MG tablet Take 1 tablet by mouth every 4 (four) hours as needed for severe pain. 10 tablet 0  . meclizine (ANTIVERT) 25 MG tablet Take 1 tablet (25 mg total) by mouth 3 (three) times daily as needed for dizziness. (Patient taking differently: Take 25 mg by mouth daily as needed for dizziness. ) 30 tablet 1  . pantoprazole (PROTONIX) 40 MG tablet Take 1 tablet (40 mg total) by mouth daily. (Patient taking differently: Take 40 mg by mouth daily as needed (for heartburn). ) 30 tablet 0   Facility-Administered Medications Ordered in Other Visits  Medication Dose Route Frequency Provider Last Rate Last  Admin  . morphine 4 MG/ML injection 4 mg  4 mg Intravenous Once Geoffery Lyons, MD        PHYSICAL EXAMINATION: ECOG PERFORMANCE STATUS: {CHL ONC ECOG IE:[id number]}  There were no vitals filed for  this visit. There were no vitals filed for this visit.  GENERAL:alert, no distress and comfortable SKIN: skin color, texture, turgor are normal, no rashes or significant lesions EYES: normal, Conjunctiva are pink and non-injected, sclera clear OROPHARYNX:no exudate, no erythema and lips, buccal mucosa, and tongue normal  NECK: supple, thyroid normal size, non-tender, without nodularity LYMPH:  no palpable lymphadenopathy in the cervical, axillary or inguinal LUNGS: clear to auscultation and percussion with normal breathing effort HEART: regular rate & rhythm and no murmurs and no lower extremity edema ABDOMEN:abdomen soft, non-tender and normal bowel sounds Musculoskeletal:no cyanosis of digits and no clubbing  NEURO: alert & oriented x 3 with fluent speech, no focal motor/sensory deficits  LABORATORY DATA:  I have reviewed the data as listed CBC Latest Ref Rng & Units 10/07/2019 10/07/2019 10/01/2019  WBC 4.0 - 10.5 K/uL - 20.0(H) 18.6(H)  Hemoglobin 12.0 - 15.0 g/dL 66.0 63.0 16.0  Hematocrit 36 - 46 % 40.0 39.1 39.6  Platelets 150 - 400 K/uL - 132(L) 168     CMP Latest Ref Rng & Units 10/07/2019 10/07/2019 09/19/2019  Glucose 70 - 99 mg/dL 109(N) 235(T) 732(K)  BUN 6 - 20 mg/dL 17 15 10   Creatinine 0.44 - 1.00 mg/dL 0.25 4.27  Sodium 135 - 145 mmol/L 139 136 135  Potassium 3.5 - 5.1 mmol/L 4.1 4.0 3.5  Chloride 98 - 111 mmol/L 103 105 100  CO2 22 - 32 mmol/L - 22 26  Calcium 8.9 - 10.3 mg/dL - 9.3 8.9  Total Protein 6.5 - 8.1 g/dL - 8.3(H) -  Total Bilirubin 0.3 - 1.2 mg/dL - 0.5 -  Alkaline Phos 38 - 126 U/L - 69 -  AST 15 - 41 U/L - 21 -  ALT 0 - 44 U/L - 21 -      RADIOGRAPHIC STUDIES: I have personally reviewed the radiological images as listed and agreed with the findings in the report. CT HEAD WO CONTRAST  Result Date: 10/07/2019 CLINICAL DATA:  Provided history: Possible stroke; transient ischemic attack (TIA). Additional history provided: Pain in left arm, leg,  left side of body, numbness, swelling of left leg, headache. EXAM: CT HEAD WITHOUT CONTRAST TECHNIQUE: Contiguous axial images were obtained from the base of the skull through the vertex without intravenous contrast. COMPARISON:  Prior head CT examinations 09/19/2019 and earlier, brain MRI 09/21/2015. FINDINGS: Brain: Cerebral volume is normal for age. There is no acute intracranial hemorrhage. No demarcated cortical infarct. No extra-axial fluid collection. No evidence of intracranial mass. No midline shift. Unchanged small scattered foci of parenchymal calcification Redemonstrated partially empty sella turcica. Vascular: No hyperdense vessel. Skull: Normal. Negative for fracture or focal lesion. Sinuses/Orbits: Visualized orbits show no acute finding. Mild right maxillary sinus mucosal thickening. No significant mastoid effusion. IMPRESSION: No CT evidence of acute intracranial abnormality. Redemonstrated partially empty sella turcica. This is very commonly an incidental finding, but can be associated with idiopathic intracranial hypertension. Unchanged small nonspecific scattered parenchymal calcifications. Mild right maxillary sinus mucosal thickening. Electronically Signed   By: 11/22/2015 DO   On: 10/07/2019 07:35     ASSESSMENT & PLAN:  No problem-specific Assessment & Plan notes found for this encounter.   No orders of the defined types were placed in  this encounter.  All questions were answered. The patient knows to call the clinic with any problems, questions or concerns. No barriers to learning was detected. I spent {CHL ONC TIME VISIT - TDDUK:[id number]} counseling the patient face to face. The total time spent in the appointment was {CHL ONC TIME VISIT - JSEGB:[id number]} and more than 50% was on counseling and review of test results     Pollyann Samples, NP 10/07/19

## 2019-10-07 NOTE — ED Triage Notes (Signed)
Pt describes lower back pain that shoots down left leg. Pain started Sunday afternoon.

## 2019-10-07 NOTE — ED Triage Notes (Signed)
Pt reports that for the past week after receiving an infusion to increase her platelets she has been having pain in her L arm, now since Sat she has been having pain in her L leg and the whole L side of her body. Pt states that is also numbness and has slight swelling in her L leg. Neuro intact bilaterally.

## 2019-10-08 ENCOUNTER — Inpatient Hospital Stay: Payer: Self-pay

## 2019-10-08 ENCOUNTER — Inpatient Hospital Stay: Payer: Self-pay | Admitting: Nurse Practitioner

## 2019-10-08 ENCOUNTER — Ambulatory Visit: Payer: Self-pay

## 2019-10-08 ENCOUNTER — Other Ambulatory Visit: Payer: Self-pay

## 2019-10-08 ENCOUNTER — Ambulatory Visit: Payer: Self-pay | Admitting: Hematology

## 2019-10-08 ENCOUNTER — Encounter (HOSPITAL_COMMUNITY)
Admission: EM | Disposition: A | Discharge status: Home / Self Care | Payer: Self-pay | Source: Home / Self Care | Attending: Family Medicine

## 2019-10-08 DIAGNOSIS — I871 Compression of vein: Secondary | ICD-10-CM | POA: Diagnosis present

## 2019-10-08 DIAGNOSIS — F32A Depression, unspecified: Secondary | ICD-10-CM

## 2019-10-08 DIAGNOSIS — K219 Gastro-esophageal reflux disease without esophagitis: Secondary | ICD-10-CM | POA: Diagnosis present

## 2019-10-08 HISTORY — PX: LOWER EXTREMITY VENOGRAPHY: CATH118253

## 2019-10-08 HISTORY — PX: PERIPHERAL VASCULAR THROMBECTOMY: CATH118306

## 2019-10-08 HISTORY — PX: PERIPHERAL VASCULAR INTERVENTION: CATH118257

## 2019-10-08 LAB — HEPARIN LEVEL (UNFRACTIONATED)
Heparin Unfractionated: 0.27 IU/mL — ABNORMAL LOW (ref 0.30–0.70)
Heparin Unfractionated: 0.35 IU/mL (ref 0.30–0.70)
Heparin Unfractionated: 0.5 IU/mL (ref 0.30–0.70)

## 2019-10-08 LAB — URINALYSIS, ROUTINE W REFLEX MICROSCOPIC
Bilirubin Urine: NEGATIVE
Glucose, UA: NEGATIVE mg/dL
Ketones, ur: NEGATIVE mg/dL
Nitrite: NEGATIVE
Protein, ur: 30 mg/dL — AB
Specific Gravity, Urine: 1.031 — ABNORMAL HIGH (ref 1.005–1.030)
WBC, UA: >50 WBC/hpf — ABNORMAL HIGH (ref 0–5)
pH: 6 (ref 5.0–8.0)

## 2019-10-08 LAB — BASIC METABOLIC PANEL
Anion gap: 8 (ref 5–15)
BUN: 12 mg/dL (ref 6–20)
CO2: 23 mmol/L (ref 22–32)
Calcium: 9 mg/dL (ref 8.9–10.3)
Chloride: 102 mmol/L (ref 98–111)
Creatinine, Ser: 0.66 mg/dL (ref 0.44–1.00)
GFR calc Af Amer: >60 mL/min (ref >60)
GFR calc non Af Amer: >60 mL/min (ref >60)
Glucose, Bld: 161 mg/dL — ABNORMAL HIGH (ref 70–99)
Potassium: 3.9 mmol/L (ref 3.5–5.1)
Sodium: 133 mmol/L — ABNORMAL LOW (ref 135–145)

## 2019-10-08 LAB — CBC
HCT: 36.2 % (ref 36.0–46.0)
Hemoglobin: 11.6 g/dL — ABNORMAL LOW (ref 12.0–15.0)
MCH: 29.1 pg (ref 26.0–34.0)
MCHC: 32 g/dL (ref 30.0–36.0)
MCV: 90.7 fL (ref 80.0–100.0)
Platelets: 130 10*3/uL — ABNORMAL LOW (ref 150–400)
RBC: 3.99 MIL/uL (ref 3.87–5.11)
RDW: 14.8 % (ref 11.5–15.5)
WBC: 23.6 10*3/uL — ABNORMAL HIGH (ref 4.0–10.5)
nRBC: 1.5 % — ABNORMAL HIGH (ref 0.0–0.2)

## 2019-10-08 LAB — HEMOGLOBIN A1C
Hgb A1c MFr Bld: 7.2 % — ABNORMAL HIGH (ref 4.8–5.6)
Mean Plasma Glucose: 159.94 mg/dL

## 2019-10-08 SURGERY — PERIPHERAL VASCULAR THROMBECTOMY
Anesthesia: LOCAL

## 2019-10-08 MED ORDER — LIDOCAINE HCL (PF) 1 % IJ SOLN
INTRAMUSCULAR | Status: DC | PRN
  Administered 2019-10-08: 15 mL via INTRADERMAL

## 2019-10-08 MED ORDER — MIDAZOLAM HCL 2 MG/2ML IJ SOLN
INTRAMUSCULAR | Status: DC | PRN
  Administered 2019-10-08 (×2): 1 mg via INTRAVENOUS

## 2019-10-08 MED ORDER — SODIUM CHLORIDE 0.9 % IV SOLN: INTRAVENOUS | Status: AC

## 2019-10-08 MED ORDER — SODIUM CHLORIDE 0.9 % IV SOLN: 250.0000 mL | INTRAVENOUS | Status: DC | PRN

## 2019-10-08 MED ORDER — HYDRALAZINE HCL 20 MG/ML IJ SOLN: 5.0000 mg | INTRAMUSCULAR | Status: DC | PRN

## 2019-10-08 MED ORDER — LIDOCAINE HCL (PF) 1 % IJ SOLN
INTRAMUSCULAR | Status: AC
  Filled 2019-10-08: qty 30

## 2019-10-08 MED ORDER — FENTANYL CITRATE (PF) 100 MCG/2ML IJ SOLN
INTRAMUSCULAR | Status: AC
  Filled 2019-10-08: qty 2

## 2019-10-08 MED ORDER — ASPIRIN EC 81 MG PO TBEC
81.0000 mg | DELAYED_RELEASE_TABLET | Freq: Every day | ORAL | Status: DC
  Administered 2019-10-09 – 2019-10-10 (×2): 81 mg via ORAL
  Filled 2019-10-08 (×2): qty 1

## 2019-10-08 MED ORDER — IODIXANOL 320 MG/ML IV SOLN
INTRAVENOUS | Status: DC | PRN
  Administered 2019-10-08: 10 mL

## 2019-10-08 MED ORDER — MIDAZOLAM HCL 2 MG/2ML IJ SOLN
INTRAMUSCULAR | Status: AC
  Filled 2019-10-08: qty 2

## 2019-10-08 MED ORDER — OXYCODONE HCL 5 MG PO TABS
5.0000 mg | ORAL_TABLET | ORAL | Status: DC | PRN
  Administered 2019-10-09 – 2019-10-10 (×3): 10 mg via ORAL
  Filled 2019-10-08 (×3): qty 2

## 2019-10-08 MED ORDER — HEPARIN (PORCINE) 25000 UT/250ML-% IV SOLN
1450.0000 [IU]/h | INTRAVENOUS | Status: DC
  Administered 2019-10-09 – 2019-10-10 (×2): 1450 [IU]/h via INTRAVENOUS
  Filled 2019-10-08 (×2): qty 250

## 2019-10-08 MED ORDER — ACETAMINOPHEN 325 MG PO TABS
650.0000 mg | ORAL_TABLET | ORAL | Status: DC | PRN
  Administered 2019-10-08 – 2019-10-10 (×3): 650 mg via ORAL
  Filled 2019-10-08 (×3): qty 2

## 2019-10-08 MED ORDER — LABETALOL HCL 5 MG/ML IV SOLN: 10.0000 mg | INTRAVENOUS | Status: DC | PRN

## 2019-10-08 MED ORDER — SODIUM CHLORIDE 0.9% FLUSH: 3.0000 mL | INTRAVENOUS | Status: DC | PRN

## 2019-10-08 MED ORDER — HEPARIN SODIUM (PORCINE) 1000 UNIT/ML IJ SOLN
INTRAMUSCULAR | Status: DC | PRN
  Administered 2019-10-08: 8000 [IU] via INTRAVENOUS

## 2019-10-08 MED ORDER — FENTANYL CITRATE (PF) 100 MCG/2ML IJ SOLN
INTRAMUSCULAR | Status: DC | PRN
  Administered 2019-10-08 (×3): 50 ug via INTRAVENOUS

## 2019-10-08 MED ORDER — HEPARIN (PORCINE) IN NACL 1000-0.9 UT/500ML-% IV SOLN
INTRAVENOUS | Status: AC
  Filled 2019-10-08: qty 500

## 2019-10-08 MED ORDER — SODIUM CHLORIDE 0.9 % IV SOLN: INTRAVENOUS | Status: DC

## 2019-10-08 MED ORDER — HEPARIN SODIUM (PORCINE) 1000 UNIT/ML IJ SOLN
INTRAMUSCULAR | Status: AC
  Filled 2019-10-08: qty 1

## 2019-10-08 MED ORDER — ONDANSETRON HCL 4 MG/2ML IJ SOLN: 4.0000 mg | Freq: Four times a day (QID) | INTRAMUSCULAR | Status: DC | PRN

## 2019-10-08 MED ORDER — HEPARIN (PORCINE) IN NACL 1000-0.9 UT/500ML-% IV SOLN
INTRAVENOUS | Status: DC | PRN
  Administered 2019-10-08: 500 mL

## 2019-10-08 MED ORDER — SODIUM CHLORIDE 0.9% FLUSH
3.0000 mL | Freq: Two times a day (BID) | INTRAVENOUS | Status: DC
  Administered 2019-10-08 – 2019-10-09 (×3): 3 mL via INTRAVENOUS

## 2019-10-08 SURGICAL SUPPLY — 20 items
BAG SNAP BAND KOVER 36X36 (MISCELLANEOUS) ×1 IMPLANT
BALLN ATHLETIS 12X60X75 (BALLOONS) ×3
BALLN ATLAS 14X40X75 (BALLOONS) ×3
BALLOON ATHLETIS 12X60X75 (BALLOONS) IMPLANT
BALLOON ATLAS 14X40X75 (BALLOONS) IMPLANT
CATH ANGIO 5F BER2 100CM (CATHETERS) ×1 IMPLANT
CATH RETRIEVER CLOT 16MMX105CM (CATHETERS) ×1 IMPLANT
CATH VISIONS PV .035 IVUS (CATHETERS) ×1 IMPLANT
COVER DOME SNAP 22 D (MISCELLANEOUS) ×2 IMPLANT
GLIDEWIRE ADV .035X260CM (WIRE) ×1 IMPLANT
KIT ENCORE 26 ADVANTAGE (KITS) ×1 IMPLANT
KIT MICROPUNCTURE NIT STIFF (SHEATH) ×1 IMPLANT
PROTECTION STATION PRESSURIZED (MISCELLANEOUS) ×3
SHEATH CLOT RETRIEVER (SHEATH) ×1 IMPLANT
SHEATH PINNACLE 8F 10CM (SHEATH) ×1 IMPLANT
SHEATH PROBE COVER 6X72 (BAG) ×1 IMPLANT
STATION PROTECTION PRESSURIZED (MISCELLANEOUS) IMPLANT
STENT WALLSTENTÂ 16X90X75 (Permanent Stent) ×1 IMPLANT
TRAY PV CATH (CUSTOM PROCEDURE TRAY) ×3 IMPLANT
WIRE BENTSON .035X145CM (WIRE) ×1 IMPLANT

## 2019-10-08 NOTE — Progress Notes (Signed)
ANTICOAGULATION CONSULT NOTE - Follow Up Consult  Pharmacy Consult for heparin Indication: DVT  Labs: Recent Labs    10/07/19 0650 10/07/19 0650 10/07/19 0700 10/07/19 2149 10/08/19 0258 10/08/19 1203 10/08/19 2210  HGB 12.1   < > 13.6  --  11.6*  --   --   HCT 39.1  --  40.0  --  36.2  --   --   PLT 132*  --   --   --  130*  --   --   APTT 32  --   --   --   --   --   --   LABPROT 13.5  --   --   --   --   --   --   INR 1.1  --   --   --   --   --   --   HEPARINUNFRC  --   --   --    < > 0.27* 0.35 0.50  CREATININE 0.68  --  0.50  --  0.66  --   --    < > = values in this interval not displayed.    Assessment/Plan:  44yo female remains therapeutic on heparin. Will continue gtt at current rate and monitor daily level.   Vernard Gambles, PharmD, BCPS  10/08/2019,11:29 PM

## 2019-10-08 NOTE — Progress Notes (Signed)
ANTICOAGULATION CONSULT NOTE - Follow Up Consult  Pharmacy Consult for Heparin Indication: DVT  No Known Allergies  Patient Measurements: Height: 4\' 9"  (144.8 cm) Weight: 111.6 kg (246 lb) IBW/kg (Calculated) : 38.6 Heparin Dosing Weight: 73 kg  Vital Signs: BP: 106/74 (07/21 0111) Pulse Rate: 108 (07/21 0111)  Labs: Recent Labs    10/07/19 0650 10/07/19 0650 10/07/19 0700 10/07/19 2149 10/08/19 0258  HGB 12.1   < > 13.6  --  11.6*  HCT 39.1  --  40.0  --  36.2  PLT 132*  --   --   --  130*  APTT 32  --   --   --   --   LABPROT 13.5  --   --   --   --   INR 1.1  --   --   --   --   HEPARINUNFRC  --   --   --  0.31 0.27*  CREATININE 0.68  --  0.50  --  0.66   < > = values in this interval not displayed.    Estimated Creatinine Clearance: 96.1 mL/min (by C-G formula based on SCr of 0.66 mg/dL).   Medications:  Infusions:  . heparin 1,300 Units/hr (10/07/19 2332)    Assessment: 44 year of age patient diagnosed with acute DVT and started on IV Heparin therapy.   Heparin level is therapeutic at 0.31 after initial bolus and rate of 1200 units/hr (drawn about 5 hours after initiation). H/H is within normal limits. Note low platelets of 132 - chronic issue from ITP on Nplate treatment.   7/21 AM update:  Heparin level just below goal   Goal of Therapy:  Heparin level 0.3-0.7 units/ml Monitor platelets by anticoagulation protocol: Yes   Plan:  Inc heparin to 1450 units/hr 1200 heparin level  8/21, PharmD, BCPS Clinical Pharmacist Phone: [phone]

## 2019-10-08 NOTE — Progress Notes (Signed)
PROGRESS NOTE  [name] VOZ:[id number] DOB: 1975/04/28 DOA: 10/07/2019 PCP: Malachy Mood, MD  HPI/Recap of past 69 hours: 44 year old female with past medical history of morbid obesity and chronic ITP presented to the emergency room on 7/20 with sudden onset left leg and arm swelling x1 week her symptoms have progressed from left arm to include left leg causing her significant pain when standing up.  Patient found to have extensive left leg DVT.  Vascular surgery consulted.  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  Patient started on heparin drip and admitted to hospitalist service.  Vascular surgery plans to take patient for left leg venous thrombectomy, IV ultrasound and possible stent.  Patient doing okay, currently no leg pain.  No shortness of breath.  Slightly anxious about thrombectomy.  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  Continue heparin. Active Problems:   Chronic hepatitis B (HCC): Stable at this time.    Chronic ITP (idiopathic thrombocytopenia) (HCC): Stable.  Platelet count at 130.  Last received IVIG on 7/10.    Morbid obesity [facility]): Patient is criteria BMI greater than 40.    Depression: Continue home medications.    GERD (gastroesophageal reflux disease): Continue PPI.  Code Status: Full code  Family Communication: Daughter at the bedside  Disposition Plan: Possibly within the next 1 to 2 days following vascular surgery procedure and clearance   Consultants:  Vascular surgery  Procedures:  Planned thrombectomy, possible stent placement  Antimicrobials:  None  DVT prophylaxis: IV heparin   Objective: Vitals:   10/08/19 1417 10/08/19 1422  BP: 139/85 136/81  Pulse: 95 (!) 0  Resp: (!) 23 (!) 23  Temp:    SpO2: 98% (!)  0%   No intake or output data in the 24 hours ending 10/08/19 1440 Filed Weights   10/07/19 1439  Weight: 111.6 kg   Body mass index is 53.23 kg/m.  Exam:   General: Alert and oriented x3, no acute distress  Cardiovascular: Regular rate and rhythm, S1-S2  Respiratory: Clear to auscultation bilaterally, limited secondary to body habitus  Abdomen: Soft, obese, nontender, positive bowel sounds  Musculoskeletal: No clubbing or cyanosis, trace pitting edema on the right.  Left leg with 1+ edema  Skin: No skin breaks, tears or lesions  Psychiatry: Appropriate, no evidence of psychoses   Data Reviewed: CBC: Recent Labs  Lab 10/07/19 0650 10/07/19 0700 10/08/19 0258  WBC 20.0*  --  23.6*  NEUTROABS 16.0*  --   --   HGB 12.1 13.6 11.6*  HCT 39.1 40.0 36.2  MCV 90.7  --  90.7  PLT 132*  --  130*   Basic Metabolic Panel: Recent Labs  Lab 10/07/19 0650 10/07/19 0700 10/08/19 0258  NA 136 139 133*  K 4.0 4.1 3.9  CL 105 103 102  CO2 22  --  23  GLUCOSE 147* 146* 161*  BUN 15 17 12   CREATININE 0.68 0.50 0.66  CALCIUM 9.3  --  9.0   GFR: Estimated Creatinine Clearance: 96.1 mL/min (by C-G formula based on SCr of 0.66 mg/dL). Liver Function Tests: Recent Labs  Lab 10/07/19 0650  AST 21  ALT 21  ALKPHOS 69  BILITOT 0.5  PROT 8.3*  ALBUMIN 3.3*   No results for input(s): LIPASE, AMYLASE in the last 168 hours. No  results for input(s): AMMONIA in the last 168 hours. Coagulation Profile: Recent Labs  Lab 10/07/19 0650  INR 1.1   Cardiac Enzymes: No results for input(s): CKTOTAL, CKMB, CKMBINDEX, TROPONINI in the last 168 hours. BNP (last 3 results) No results for input(s): PROBNP in the last 8760 hours. HbA1C: Recent Labs    10/08/19 0258  HGBA1C 7.2*   CBG: Recent Labs  Lab 10/07/19 1153  GLUCAP 116*   Lipid Profile: No results for input(s): CHOL, HDL, LDLCALC, TRIG, CHOLHDL, LDLDIRECT in the last 72 hours. Thyroid Function Tests: No  results for input(s): TSH, T4TOTAL, FREET4, T3FREE, THYROIDAB in the last 72 hours. Anemia Panel: No results for input(s): VITAMINB12, FOLATE, FERRITIN, TIBC, IRON, RETICCTPCT in the last 72 hours. Urine analysis:    Component Value Date/Time   COLORURINE YELLOW 10/08/2019 0849   APPEARANCEUR CLOUDY (A) 10/08/2019 0849   LABSPEC 1.031 (H) 10/08/2019 0849   PHURINE 6.0 10/08/2019 0849   GLUCOSEU NEGATIVE 10/08/2019 0849   HGBUR SMALL (A) 10/08/2019 0849   BILIRUBINUR NEGATIVE 10/08/2019 0849   BILIRUBINUR neg 10/12/2015 0852   KETONESUR NEGATIVE 10/08/2019 0849   PROTEINUR 30 (A) 10/08/2019 0849   UROBILINOGEN 0.2 10/12/2015 0852   UROBILINOGEN 0.2 10/02/2011 1133   NITRITE NEGATIVE 10/08/2019 0849   LEUKOCYTESUR MODERATE (A) 10/08/2019 0849   Sepsis Labs: @LABRCNTIP (procalcitonin:4,lacticidven:4)  ) Recent Results (from the past 240 hour(s))  SARS Coronavirus 2 by RT PCR (hospital order, performed in [facility]) Nasopharyngeal Nasopharyngeal Swab     Status: None   Collection Time: 10/07/19  5:30 PM   Specimen: Nasopharyngeal Swab  Result Value Ref Range Status   SARS Coronavirus 2 NEGATIVE NEGATIVE Final    Comment: (NOTE) SARS-CoV-2 target nucleic acids are NOT DETECTED.  The SARS-CoV-2 RNA is generally detectable in upper and lower respiratory specimens during the acute phase of infection. The lowest concentration of SARS-CoV-2 viral copies this assay can detect is 250 copies / mL. A negative result does not preclude SARS-CoV-2 infection and should not be used as the sole basis for treatment or other patient management decisions.  A negative result may occur with improper specimen collection / handling, submission of specimen other than nasopharyngeal swab, presence of viral mutation(s) within the areas targeted by this assay, and inadequate number of viral copies (<250 copies / mL). A negative result must be combined with clinical observations, patient  history, and epidemiological information.  Fact Sheet for Patients:   BoilerBrush.com.cyhttps://www.fda.gov/media/136312/download  Fact Sheet for Healthcare Providers: https://pope.com/https://www.fda.gov/media/136313/download  This test is not yet approved or  cleared by the [place] FDA and has been authorized for detection and/or diagnosis of SARS-CoV-2 by FDA under an Emergency Use Authorization (EUA).  This EUA will remain in effect (meaning this test can be used) for the duration of the COVID-19 declaration under Section 564(b)(1) of the Act, 21 U.S.C. section 360bbb-3(b)(1), unless the authorization is terminated or revoked sooner.  Performed at [facility], [address] [place], [place] [address]       Studies: DG Chest 2 View  Result Date: 10/07/2019 CLINICAL DATA:  44 year old female with left extremity pain. EXAM: CHEST - 2 VIEW COMPARISON:  Chest radiograph dated 04/20/2019. FINDINGS: There is borderline cardiomegaly with mild vascular congestion. No focal consolidation, pleural effusion, pneumothorax. No acute osseous pathology. Top-normal caliber small bowel loop in the left upper abdomen measure up to 3 cm. IMPRESSION: Borderline cardiomegaly with mild vascular congestion. No focal consolidation. Electronically Signed   By:  Elgie Collard M.D.   On: 10/07/2019 19:13   CT VENOGRAM ABD/PEL  Result Date: 10/07/2019 CLINICAL DATA:  44 year old with known left lower DVT. CT venogram ordered to evaluate the extent of the DVT. EXAM: CT VENOGRAM ABD-PELVIS TECHNIQUE: Multidetector CT imaging of the abdomen and pelvis was performed using the standard venogram protocol during bolus administration of intravenous contrast. Multiplanar reconstructed images and MIPs were obtained and reviewed to evaluate the vascular anatomy. CONTRAST:  OMNIPAQUE IOHEXOL 350 MG/ML SOLN COMPARISON:  03/13/2018 FINDINGS: VASCULAR Arterial structures: Normal caliber of the abdominal aorta without atherosclerotic  calcifications. Small amount of calcification in the right common iliac artery. Bilateral iliac arteries are patent. Portal venous system: Portal venous system is patent. IVC: Small amount of nonocclusive thrombus along the left side of the distal IVC. This thrombus extends approximately 2.6 cm above the IVC bifurcation. Suprarenal IVC is patent. Bilateral renal veins are patent. Iliac veins: Thrombus and stranding involving the left common and left external iliac veins. Findings are suggestive for acute thrombus. At least mild compression on the left common iliac vein from the right common iliac artery. Right iliac veins appear to be patent. Evidence for thrombus in the proximal left femoral veins. Review of the MIP images confirms the above findings. NON-VASCULAR Lower chest: Lung bases are clear. Thrombus in left lower lobe segmental pulmonary arteries best seen on sequence 3, image 1. Limited evaluation for pulmonary embolism on this examination. Hepatobiliary: Low-attenuation of the liver is suggestive for hepatic steatosis. Normal appearance of the gallbladder. No biliary dilatation. Pancreas: Unremarkable. No pancreatic ductal dilatation or surrounding inflammatory changes. Spleen: Surgically removed. Adrenals/Urinary Tract: Normal appearance of the adrenal glands. Normal appearance of both kidneys without hydronephrosis. No suspicious renal lesions. Urinary bladder is mildly distended. Stomach/Bowel: Normal appearance of the stomach. No evidence for bowel obstruction or focal bowel inflammation. Lymphatic: But cannot exclude prominent lymph nodes in the left lower iliac region but these may be reactive. Extensive edema along the left iliac chain related to the DVT also limits evaluation. Otherwise, no significant lymph node enlargement in the abdomen or pelvis. Reproductive: 3.3 cm low-density structure associated with the left ovary likely represents a cyst. Probable right ovarian follicle measuring 2.1 cm.  Uterus is unremarkable. Other: Extensive edema and stranding along the left iliac vessels compatible with the acute DVT. Negative for free fluid. Negative for free air. Musculoskeletal: No acute bone abnormality. IMPRESSION: VASCULAR 1. Positive for deep venous thrombosis involving the distal IVC, left iliac veins and proximal left femoral veins. The distal IVC thrombus is nonocclusive. 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 2. Pulmonary embolism in the left lower lobe. Incomplete evaluation of the pulmonary embolism on this examination. NON-VASCULAR 1. Hepatic steatosis. 2. Probable left ovarian cyst. 3. Extensive stranding along the left side of the pelvis related to the left-sided DVT. These results were called by telephone at the time of interpretation on 10/07/2019 at 5:17 pm to Dr. Charm Barges in the emergency department, Who verbally acknowledged these results. Electronically Signed   By: Richarda Overlie M.D.   On: 10/07/2019 17:23    Scheduled Meds: . citalopram  10 mg Oral Daily   Followed by  . [START ON 10/14/2019] citalopram  20 mg Oral Daily  . pantoprazole  40 mg Oral Daily    Continuous Infusions: . sodium chloride 100 mL/hr at 10/08/19 0630  . heparin 1,450 Units/hr (10/08/19 0413)     LOS: 1 day  Hollice Espy, MD [facility]   10/08/2019, 2:40 PM

## 2019-10-08 NOTE — ED Notes (Signed)
Visually checked and Pt and brought her another blanket. Pt stated that she was doing all right

## 2019-10-08 NOTE — Op Note (Signed)
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  Ultrasound-guided access of left popliteal vein 2.  IVUS (intra-vascular ultrasound) of left popliteal, superficial femoral, common femoral, external iliac, common iliac vein, and IVC  3.  Percutaneous mechanical thrombectomy of left popliteal, superficial femoral, common femoral, external iliac, common iliac vein and IVC (Innari ClotTriever) 4.  Left common/external iliac vein angioplasty with stent placement (predilated with a 12 mm Atlas, left common iliac vein stented with a 16 mm x 90 mm Wallstent, postdilated with a 14 mm Atlas) 5.  Left femoral venogram 6.  74 minutes of monitored moderate conscious sedation time  Indications: 44 year old Hispanic female who presented with extensive left leg swelling and found to have large left iliofemoral DVT.  She did undergo CT venogram that suggested May Thurner with no other evidence of etiology for her DVT.  She presents today for planned percutaneous mechanical thrombectomy with likely stent placement in the left common iliac vein after IVUS.  Risks and benefits have been discussed.  Findings:   After popliteal access, IVUS confirmed mostly acute with some underlying chronic thrombus in the left popliteal, superficial femoral vein, common femoral vein, external iliac vein and common iliac vein with a small tongue of thrombus into the IVC.  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  Percutaneous mechanical thrombectomy was performed within Innari ClotTriever with 7 passes of the left popliteal, superficial femoral, common femoral, external iliac, and left common  iliac vein and IVC and retrieved large volumes of mostly acute and some chronic appearing thrombus.  The left common iliac vein stenosis was then predilated with a 12 mm Atlas, stented with a 16 mm Wallstent, and postdilated with a 14 mm Atlas with good wall apposition no residual stenosis.   Procedure:  The patient was identified in the holding area and taken to room 8.  Patient was placed prone on the table.  The left popliteal fossa was then prepped and draped in usual sterile fashion.  A timeout was performed identify patient, procedure and site.  Initially used ultrasound and evaluated the left popliteal vein which was full of acute thrombus and this was accessed with a micro access needle placed a microwire and then a micro sheath under ultrasound guidance.  I then performed a brief hand-injection to confirm I was in the popliteal vein with a venogram which I confirmed due to her size and depth of her vessels.  I then used a KMP catheter with a Glidewire advantage to cross the thrombus in the left femoral and iliac vein into the IVC and placed my wire up into the subclavian vein.  An 8 French sheath was placed in the left popliteal vein and the patient was given 8000 units of IV heparin.  I then performed IVUS of the left popliteal, superficial femoral, common femoral, external iliac, common iliac vein and IVC.  Pertinent findings are noted above.  Ultimately elected to perform percutaneous mechanical thrombectomy and we upsized to the 13 [place] Innari sheath in the left popliteal vein.  We then used the [employer] and this was subsequently placed over the glidewire advantage up into the IVC where the catch bag was deployed and we made a total of 7 passes getting  large volumes of acute thrombus with some chronic thrombus as well.  We ultimately performed IVUS again after the first 5 passes and then made 2 additional passes given evidence of additional thrombus.  After 7 passes the iliofemoral segment  and IVC all looked fairly clean of thrombus.  We then used our IVUS to mark the iliac vein bifurcation as well as the 87% compression on the left common iliac vein.  This lesion was then predilated with a 14 mm Atlas.  We then selected a 16 mm x 90 mm Wallstent that was deployed into the distal IVC across the iliac vein stenosis in the left common iliac vein and into the left external iliac vein.  This was postdilated with a 14 mm Atlas.  We had good wall apposition on final IVUS with no evidence of residual thrombus and appeared to have good inflow and outflow in the stent.  Satisfied with the results wires and catheters were removed.  I tied a 4-0 Monocryl pursestring around the Innari sheath that was removed from the popliteal vein and pressure was held.  Taken to holding in stable condition.  Plan: The patient should continue heparin tonight and and this should not be held and can be continued immediately.  Can be transitioned to a DOAC tomorrow if she does okay tonight.   Cephus Shelling, MD [facility] Office: [phone]

## 2019-10-08 NOTE — Progress Notes (Signed)
ANTICOAGULATION CONSULT NOTE - Follow Up Consult  Pharmacy Consult for Heparin Indication: DVT  No Known Allergies  Patient Measurements: Height: 4\' 9"  (144.8 cm) Weight: 111.6 kg (246 lb) IBW/kg (Calculated) : 38.6 Heparin Dosing Weight: 73 kg  Vital Signs: Temp: 98.7 F (37.1 C) (07/21 1232) Temp Source: Oral (07/21 0859) BP: 136/81 (07/21 1422) Pulse Rate: 0 (07/21 1422)  Labs: Recent Labs    10/07/19 0650 10/07/19 0650 10/07/19 0700 10/07/19 2149 10/08/19 0258 10/08/19 1203  HGB 12.1   < > 13.6  --  11.6*  --   HCT 39.1  --  40.0  --  36.2  --   PLT 132*  --   --   --  130*  --   APTT 32  --   --   --   --   --   LABPROT 13.5  --   --   --   --   --   INR 1.1  --   --   --   --   --   HEPARINUNFRC  --   --   --  0.31 0.27* 0.35  CREATININE 0.68  --  0.50  --  0.66  --    < > = values in this interval not displayed.    Estimated Creatinine Clearance: 96.1 mL/min (by C-G formula based on SCr of 0.66 mg/dL).   Medications:  Infusions:  . sodium chloride 100 mL/hr at 10/08/19 0630  . sodium chloride    . sodium chloride    . heparin 1,450 Units/hr (10/08/19 0413)    Assessment: 44 year of age patient diagnosed with acute DVT and started on IV Heparin therapy.  Now s/p thrombectomy and stent  Heparin to continue post procedure Heparin level therapeutic at 0.35 at 12 noon  Goal of Therapy:  Heparin level 0.3-0.7 units/ml Monitor platelets by anticoagulation protocol: Yes   Plan:  Continue Heparin at 1450 units / hr 6 hr heparin level to confirm therapeutic post procedure Planning DOAC 7/22  Thank you 8/22, PharmD [phone]

## 2019-10-08 NOTE — Progress Notes (Signed)
[name]   DOB:June 30, 1975   AY#:[id number]   ATF#:[id number]  Hematology follow up   Subjective: Patient to me, under my care for her ITP.  She was admitted for extensive left lower extremity DVT.  I saw her around noon today, and spoke with her and her daughter at the bedside.  She feels her left leg discomfort has improved some.  It is still quite swollen.    Objective:  Vitals:   10/08/19 1422 10/08/19 1520  BP: 136/81 111/72  Pulse: (!) 0   Resp: (!) 23 18  Temp:  99.4 F (37.4 C)  SpO2: (!) 0% 94%    Body mass index is 53.23 kg/m. No intake or output data in the 24 hours ending 10/08/19 1656   Sclerae unicteric  Oropharynx clear  No peripheral adenopathy  Lungs clear -- no rales or rhonchi  Heart regular rate and rhythm  Abdomen benign  MSK: (+) Left lower extremity edema up to thigh   CBG (last 3)  Recent Labs    10/07/19 1153  GLUCAP 116*     Labs:  Urine Studies No results for input(s): UHGB, CRYS in the last 72 hours.  Invalid input(s): UACOL, UAPR, USPG, UPH, UTP, UGL, UKET, UBIL, UNIT, UROB, ULEU, UEPI, UWBC, URBC, UBAC, CAST, [place], [place]  Basic Metabolic Panel: Recent Labs  Lab 10/07/19 0650 10/07/19 0650 10/07/19 0700 10/08/19 0258  NA 136  --  139 133*  K 4.0   < > 4.1 3.9  CL 105  --  103 102  CO2 22  --   --  23  GLUCOSE 147*  --  146* 161*  BUN 15  --  17 12  CREATININE 0.68  --  0.50 0.66  CALCIUM 9.3  --   --  9.0   < > = values in this interval not displayed.   GFR Estimated Creatinine Clearance: 96.1 mL/min (by C-G formula based on SCr of 0.66 mg/dL). Liver Function Tests: Recent Labs  Lab 10/07/19 0650  AST 21  ALT 21  ALKPHOS 69  BILITOT 0.5  PROT 8.3*  ALBUMIN 3.3*   No results for input(s): LIPASE, AMYLASE in the last 168 hours. No results for input(s): AMMONIA in the last 168 hours. Coagulation profile Recent Labs  Lab 10/07/19 0650  INR 1.1    CBC: Recent Labs  Lab 10/07/19 0650 10/07/19 0700  10/08/19 0258  WBC 20.0*  --  23.6*  NEUTROABS 16.0*  --   --   HGB 12.1 13.6 11.6*  HCT 39.1 40.0 36.2  MCV 90.7  --  90.7  PLT 132*  --  130*   Cardiac Enzymes: No results for input(s): CKTOTAL, CKMB, CKMBINDEX, TROPONINI in the last 168 hours. BNP: Invalid input(s): POCBNP CBG: Recent Labs  Lab 10/07/19 1153  GLUCAP 116*   D-Dimer No results for input(s): DDIMER in the last 72 hours. Hgb A1c Recent Labs    10/08/19 0258  HGBA1C 7.2*   Lipid Profile No results for input(s): CHOL, HDL, LDLCALC, TRIG, CHOLHDL, LDLDIRECT in the last 72 hours. Thyroid function studies No results for input(s): TSH, T4TOTAL, T3FREE, THYROIDAB in the last 72 hours.  Invalid input(s): FREET3 Anemia work up No results for input(s): VITAMINB12, FOLATE, FERRITIN, TIBC, IRON, RETICCTPCT in the last 72 hours. Microbiology Recent Results (from the past 240 hour(s))  SARS Coronavirus 2 by RT PCR (hospital order, performed in [facility] hospital lab) Nasopharyngeal Nasopharyngeal Swab     Status: None   Collection  Time: 10/07/19  5:30 PM   Specimen: Nasopharyngeal Swab  Result Value Ref Range Status   SARS Coronavirus 2 NEGATIVE NEGATIVE Final    Comment: (NOTE) SARS-CoV-2 target nucleic acids are NOT DETECTED.  The SARS-CoV-2 RNA is generally detectable in upper and lower respiratory specimens during the acute phase of infection. The lowest concentration of SARS-CoV-2 viral copies this assay can detect is 250 copies / mL. A negative result does not preclude SARS-CoV-2 infection and should not be used as the sole basis for treatment or other patient management decisions.  A negative result may occur with improper specimen collection / handling, submission of specimen other than nasopharyngeal swab, presence of viral mutation(s) within the areas targeted by this assay, and inadequate number of viral copies (<250 copies / mL). A negative result must be combined with clinical observations,  patient history, and epidemiological information.  Fact Sheet for Patients:   BoilerBrush.com.cy  Fact Sheet for Healthcare Providers: https://pope.com/  This test is not yet approved or  cleared by the [place] FDA and has been authorized for detection and/or diagnosis of SARS-CoV-2 by FDA under an Emergency Use Authorization (EUA).  This EUA will remain in effect (meaning this test can be used) for the duration of the COVID-19 declaration under Section 564(b)(1) of the Act, 21 U.S.C. section 360bbb-3(b)(1), unless the authorization is terminated or revoked sooner.  Performed at [facility], [address], [place], [place] [address]       Studies:  DG Chest 2 View  Result Date: 10/07/2019 CLINICAL DATA:  44 year old female with left extremity pain. EXAM: CHEST - 2 VIEW COMPARISON:  Chest radiograph dated 04/20/2019. FINDINGS: There is borderline cardiomegaly with mild vascular congestion. No focal consolidation, pleural effusion, pneumothorax. No acute osseous pathology. Top-normal caliber small bowel loop in the left upper abdomen measure up to 3 cm. IMPRESSION: Borderline cardiomegaly with mild vascular congestion. No focal consolidation. Electronically Signed   By: Elgie Collard M.D.   On: 10/07/2019 19:13   CT HEAD WO CONTRAST  Result Date: 10/07/2019 CLINICAL DATA:  Provided history: Possible stroke; transient ischemic attack (TIA). Additional history provided: Pain in left arm, leg, left side of body, numbness, swelling of left leg, headache. EXAM: CT HEAD WITHOUT CONTRAST TECHNIQUE: Contiguous axial images were obtained from the base of the skull through the vertex without intravenous contrast. COMPARISON:  Prior head CT examinations 09/19/2019 and earlier, brain MRI 09/21/2015. FINDINGS: Brain: Cerebral volume is normal for age. There is no acute intracranial hemorrhage. No demarcated cortical infarct. No  extra-axial fluid collection. No evidence of intracranial mass. No midline shift. Unchanged small scattered foci of parenchymal calcification Redemonstrated partially empty sella turcica. Vascular: No hyperdense vessel. Skull: Normal. Negative for fracture or focal lesion. Sinuses/Orbits: Visualized orbits show no acute finding. Mild right maxillary sinus mucosal thickening. No significant mastoid effusion. IMPRESSION: No CT evidence of acute intracranial abnormality. Redemonstrated partially empty sella turcica. This is very commonly an incidental finding, but can be associated with idiopathic intracranial hypertension. Unchanged small nonspecific scattered parenchymal calcifications. Mild right maxillary sinus mucosal thickening. 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  Ultrasound-guided access of left popliteal vein 2.  IVUS (intra-vascular ultrasound) of left popliteal, superficial femoral, common femoral, external iliac, common iliac vein, and IVC 3.  Percutaneous mechanical thrombectomy of left popliteal, superficial femoral, common femoral, external iliac, common iliac vein and IVC (Innari ClotTriever) 4.  Left common/external iliac vein angioplasty with stent placement (predilated with a 12 mm Atlas, left common iliac vein stented with a 16 mm x 90 mm Wallstent, postdilated with a 14 mm Atlas) 5.  Left femoral venogram 6.  74 minutes of monitored moderate conscious sedation time  Indications: 44 year old Hispanic female who presented with extensive left leg swelling and found to have large left iliofemoral DVT.  She did  undergo CT venogram that suggested May Thurner with no other evidence of etiology for her DVT.  She presents today for planned percutaneous mechanical thrombectomy with likely stent placement in the left common iliac vein after IVUS.  Risks and benefits have been discussed.  Findings:  After popliteal access, IVUS confirmed mostly acute with some underlying chronic thrombus in the left popliteal, superficial femoral vein, common femoral vein, external iliac vein and common iliac vein with a small tongue of thrombus into the IVC.  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  Percutaneous mechanical thrombectomy was performed within Innari ClotTriever with 7 passes of the left popliteal, superficial femoral, common femoral, external iliac, and left common iliac vein and IVC and retrieved large volumes of mostly acute and some chronic appearing thrombus.  The left common iliac vein stenosis was then predilated with a 12 mm Atlas, stented with a 16 mm Wallstent, and postdilated with a 14 mm Atlas with good wall apposition no residual stenosis.             Procedure:  The patient was identified in the holding area and taken to room 8.  Patient was placed prone on the table.  The left popliteal fossa was then prepped and draped in usual sterile fashion.  A timeout was performed identify patient, procedure and site.  Initially used ultrasound and evaluated the left popliteal vein which was full of acute thrombus and this was accessed with a micro access needle placed a microwire and then a micro sheath under ultrasound guidance.  I then performed a brief hand-injection to confirm I was in the popliteal vein with a venogram which I confirmed due to her size and depth of her vessels.  I then used a KMP catheter with a Glidewire advantage to cross the thrombus in the left femoral and iliac vein into the IVC and  placed my wire up into the subclavian vein.  An 8 French sheath was placed in the left popliteal vein and the patient was given 8000 units of IV heparin.  I then performed IVUS of the left popliteal, superficial femoral, common femoral, external iliac, common iliac vein and IVC.  Pertinent findings are noted above.  Ultimately elected to perform percutaneous mechanical thrombectomy and we upsized to the 13 [place] Innari sheath in the left popliteal vein.  We then used the [employer] and this was subsequently placed over the glidewire advantage up into the IVC where the catch bag was deployed and we made a total of 7 passes getting large volumes of acute thrombus with some chronic thrombus as well.  We ultimately performed IVUS again after the  first 5 passes and then made 2 additional passes given evidence of additional thrombus.  After 7 passes the iliofemoral segment and IVC all looked fairly clean of thrombus.  We then used our IVUS to mark the iliac vein bifurcation as well as the 87% compression on the left common iliac vein.  This lesion was then predilated with a 14 mm Atlas.  We then selected a 16 mm x 90 mm Wallstent that was deployed into the distal IVC across the iliac vein stenosis in the left common iliac vein and into the left external iliac vein.  This was postdilated with a 14 mm Atlas.  We had good wall apposition on final IVUS with no evidence of residual thrombus and appeared to have good inflow and outflow in the stent.  Satisfied with the results wires and catheters were removed.  I tied a 4-0 Monocryl pursestring around the Innari sheath that was removed from the popliteal vein and pressure was held.  Taken to holding in stable condition.  Plan: The patient should continue heparin tonight and and this should not be held and can be continued immediately.  Can be transitioned to a DOAC tomorrow if she does okay tonight.   Cephus Shelling, MD [facility] Office: [phone]  CT VENOGRAM ABD/PEL  Result Date: 10/07/2019 CLINICAL DATA:  44 year old with known left lower DVT. CT venogram ordered to evaluate the extent of the DVT. EXAM: CT VENOGRAM ABD-PELVIS TECHNIQUE: Multidetector CT imaging of the abdomen and pelvis was performed using the standard venogram protocol during bolus administration of intravenous contrast. Multiplanar reconstructed images and MIPs were obtained and reviewed to evaluate the vascular anatomy. CONTRAST:  OMNIPAQUE IOHEXOL 350 MG/ML SOLN COMPARISON:  03/13/2018 FINDINGS: VASCULAR Arterial structures: Normal caliber of the abdominal aorta without atherosclerotic calcifications. Small amount of calcification in the right common iliac artery. Bilateral iliac arteries are patent. Portal venous system: Portal venous system is patent. IVC: Small amount of nonocclusive thrombus along the left side of the distal IVC. This thrombus extends approximately 2.6 cm above the IVC bifurcation. Suprarenal IVC is patent. Bilateral renal veins are patent. Iliac veins: Thrombus and stranding involving the left common and left external iliac veins. Findings are suggestive for acute thrombus. At least mild compression on the left common iliac vein from the right common iliac artery. Right iliac veins appear to be patent. Evidence for thrombus in the proximal left femoral veins. Review of the MIP images confirms the above findings. NON-VASCULAR Lower chest: Lung bases are clear. Thrombus in left lower lobe segmental pulmonary arteries best seen on sequence 3, image 1. Limited evaluation for pulmonary embolism on this examination. Hepatobiliary: Low-attenuation of the liver is suggestive for hepatic steatosis. Normal appearance of the gallbladder. No biliary dilatation. Pancreas: Unremarkable. No pancreatic ductal dilatation or surrounding inflammatory changes. Spleen: Surgically removed. Adrenals/Urinary Tract: Normal appearance of the  adrenal glands. Normal appearance of both kidneys without hydronephrosis. No suspicious renal lesions. Urinary bladder is mildly distended. Stomach/Bowel: Normal appearance of the stomach. No evidence for bowel obstruction or focal bowel inflammation. Lymphatic: But cannot exclude prominent lymph nodes in the left lower iliac region but these may be reactive. Extensive edema along the left iliac chain related to the DVT also limits evaluation. Otherwise, no significant lymph node enlargement in the abdomen or pelvis. Reproductive: 3.3 cm low-density structure associated with the left ovary likely represents a cyst. Probable right ovarian follicle measuring 2.1 cm. Uterus is unremarkable. Other: Extensive  edema and stranding along the left iliac vessels compatible with the acute DVT. Negative for free fluid. Negative for free air. Musculoskeletal: No acute bone abnormality. IMPRESSION: VASCULAR 1. Positive for deep venous thrombosis involving the distal IVC, left iliac veins and proximal left femoral veins. The distal IVC thrombus is nonocclusive. 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 2. Pulmonary embolism in the left lower lobe. Incomplete evaluation of the pulmonary embolism on this examination. NON-VASCULAR 1. Hepatic steatosis. 2. Probable left ovarian cyst. 3. Extensive stranding along the left side of the pelvis related to the left-sided DVT. These results were called by telephone at the time of interpretation on 10/07/2019 at 5:17 pm to Dr. Charm BargesButler in the emergency department, Who verbally acknowledged these results. Electronically Signed   By: Richarda OverlieAdam  Henn M.D.   On: 10/07/2019 17:23   VAS US LOWER EXTREMITY VENOUS (DVT) (ONLY [facility] & [facility])  Result Date: 10/07/2019  Lower Venous DVTStudy Indications: Pain.  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 Limitations: Body habitus. Comparison Study: No prior studies. Performing Technologist: Jean Rosenthalachel Hodge  Examination  Guidelines: A complete evaluation includes B-mode imaging, spectral Doppler, color Doppler, and power Doppler as needed of all accessible portions of each vessel. Bilateral testing is considered an integral part of a complete examination. Limited examinations for reoccurring indications may be performed as noted. The reflux portion of the exam is performed with the patient in reverse Trendelenburg.  +-----+---------------+---------+-----------+----------+--------------+ RIGHTCompressibilityPhasicitySpontaneityPropertiesThrombus Aging +-----+---------------+---------+-----------+----------+--------------+ CFV  Full           Yes      Yes                                 +-----+---------------+---------+-----------+----------+--------------+   +---------+---------------+---------+-----------+----------+-----------------+ LEFT     CompressibilityPhasicitySpontaneityPropertiesThrombus Aging    +---------+---------------+---------+-----------+----------+-----------------+ CFV      None                                         Age Indeterminate +---------+---------------+---------+-----------+----------+-----------------+ SFJ      None                                         Age Indeterminate +---------+---------------+---------+-----------+----------+-----------------+ FV Prox  None           No       Yes                  Age Indeterminate +---------+---------------+---------+-----------+----------+-----------------+ FV Mid   None           No       Yes                  Age Indeterminate +---------+---------------+---------+-----------+----------+-----------------+ FV DistalNone           No       Yes                  Age Indeterminate +---------+---------------+---------+-----------+----------+-----------------+ PFV      None           No       Yes                  Age Indeterminate +---------+---------------+---------+-----------+----------+-----------------+  POP      None  No       Yes                  Age Indeterminate +---------+---------------+---------+-----------+----------+-----------------+ PTV      None                                         Age Indeterminate +---------+---------------+---------+-----------+----------+-----------------+ PERO     None                                         Age Indeterminate +---------+---------------+---------+-----------+----------+-----------------+ Gastroc  None                                         Age Indeterminate +---------+---------------+---------+-----------+----------+-----------------+     Summary: RIGHT: - No evidence of common femoral vein obstruction.  LEFT: - Findings consistent with age indeterminate deep vein thrombosis involving the left common femoral vein, SF junction, left femoral vein, left proximal profunda vein, left popliteal vein, left posterior tibial veins, left peroneal veins, and left gastrocnemius veins. - No cystic structure found in the popliteal fossa. - Unable to evaluate extension of common femoral vein obstruction proximal to the inguinal ligament due to pannus/patient body habitus.  *See table(s) above for measurements and observations. Electronically signed by Lemar Livings MD on 10/07/2019 at 5:30:33 PM.    Final     Assessment: 44 y.o. female   1. 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  History of mild subarachnoid hemorrhage 4.  Obesity 5. Chronic headaches and dizziness  6. Depression     Plan:  -she is going to have percutaneous mechanical thrombectomy, and left external iliac stent placement by vascular surgeon Dr. Chestine Spore later today -I think her high dose Nplate may have contributed to her hypercoagulopathy, plan to hold it for now, I am hoping rituximab would control her ITP well, plan to start as soon as she is  discharged  -will give IVIG and dexa if her plt drop below 20 or 30K  -if she has recurrent severe ITP before reduce hematochezia, I may still have to give her low-dose Nplate -She will be on anticoagulation for at least 3 months, or indefinite if she still requires Nplate. This will be complicated by her frequent ITP flare, which will require holding anticoagulation when her platelet count drop below 50K, and some time may need reverse her A/C if she has clinical significant bleeding from severe thrombocytopenia.  So I think coumadin will be a better option due to it's easy reversibility.  -If she does have frequent ITP flare require interruption of anticoagulation, she may need IVC filter placement -will continue f/u tomorrow    Malachy Mood, MD 10/08/2019  4:56 PM

## 2019-10-08 NOTE — Progress Notes (Signed)
[facility]  Subjective  - left leg swelling   Objective (!) 118/49 94 98.7 F (37.1 C) 18 97% No intake or output data in the 24 hours ending 10/08/19 1255  Left leg swelling  Laboratory Lab Results: Recent Labs    10/07/19 0650 10/07/19 0650 10/07/19 0700 10/08/19 0258  WBC 20.0*  --   --  23.6*  HGB 12.1   < > 13.6 11.6*  HCT 39.1   < > 40.0 36.2  PLT 132*  --   --  130*   < > = values in this interval not displayed.   BMET Recent Labs    10/07/19 0650 10/07/19 0650 10/07/19 0700 10/08/19 0258  NA 136   < > 139 133*  K 4.0   < > 4.1 3.9  CL 105   < > 103 102  CO2 22  --   --  23  GLUCOSE 147*   < > 146* 161*  BUN 15   < > 17 12  CREATININE 0.68   < > 0.50 0.66  CALCIUM 9.3  --   --  9.0   < > = values in this interval not displayed.    COAG Lab Results  Component Value Date   INR 1.1 10/07/2019   INR 1.1 04/08/2019   INR 1.0 03/02/2019   No results found for: PTT  Assessment/Planning:  Extensive left leg DVT.  Plan left leg venous thrombectomy , IVUS, and possible stent given concern for May Thurner.  Cephus Shelling 10/08/2019 12:55 PM --

## 2019-10-09 ENCOUNTER — Encounter (HOSPITAL_COMMUNITY): Payer: Self-pay | Admitting: Vascular Surgery

## 2019-10-09 DIAGNOSIS — I82492 Acute embolism and thrombosis of other specified deep vein of left lower extremity: Secondary | ICD-10-CM

## 2019-10-09 LAB — BASIC METABOLIC PANEL
Anion gap: 8 (ref 5–15)
BUN: 6 mg/dL (ref 6–20)
CO2: 23 mmol/L (ref 22–32)
Calcium: 8.1 mg/dL — ABNORMAL LOW (ref 8.9–10.3)
Chloride: 106 mmol/L (ref 98–111)
Creatinine, Ser: 0.5 mg/dL (ref 0.44–1.00)
GFR calc Af Amer: >60 mL/min (ref >60)
GFR calc non Af Amer: >60 mL/min (ref >60)
Glucose, Bld: 133 mg/dL — ABNORMAL HIGH (ref 70–99)
Potassium: 3.6 mmol/L (ref 3.5–5.1)
Sodium: 137 mmol/L (ref 135–145)

## 2019-10-09 LAB — HEPARIN LEVEL (UNFRACTIONATED): Heparin Unfractionated: 0.37 IU/mL (ref 0.30–0.70)

## 2019-10-09 LAB — CBC
HCT: 29.2 % — ABNORMAL LOW (ref 36.0–46.0)
Hemoglobin: 9 g/dL — ABNORMAL LOW (ref 12.0–15.0)
MCH: 27.8 pg (ref 26.0–34.0)
MCHC: 30.8 g/dL (ref 30.0–36.0)
MCV: 90.1 fL (ref 80.0–100.0)
Platelets: 154 10*3/uL (ref 150–400)
RBC: 3.24 MIL/uL — ABNORMAL LOW (ref 3.87–5.11)
RDW: 14.6 % (ref 11.5–15.5)
WBC: 19 10*3/uL — ABNORMAL HIGH (ref 4.0–10.5)
nRBC: 2.2 % — ABNORMAL HIGH (ref 0.0–0.2)

## 2019-10-09 MED ORDER — WARFARIN SODIUM 10 MG PO TABS
10.0000 mg | ORAL_TABLET | Freq: Once | ORAL | Status: AC
  Administered 2019-10-09: 10 mg via ORAL
  Filled 2019-10-09: qty 1

## 2019-10-09 MED ORDER — SENNOSIDES-DOCUSATE SODIUM 8.6-50 MG PO TABS
1.0000 | ORAL_TABLET | Freq: Once | ORAL | Status: AC
  Administered 2019-10-09: 1 via ORAL
  Filled 2019-10-09: qty 1

## 2019-10-09 MED ORDER — POLYETHYLENE GLYCOL 3350 17 G PO PACK
17.0000 g | PACK | Freq: Every day | ORAL | Status: DC
  Administered 2019-10-09 – 2019-10-10 (×2): 17 g via ORAL
  Filled 2019-10-09 (×2): qty 1

## 2019-10-09 MED ORDER — WARFARIN - PHARMACIST DOSING INPATIENT: Freq: Every day | Status: DC

## 2019-10-09 NOTE — Progress Notes (Signed)
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  Her symptoms progressed from left arm to include left leg causing significant pain and standing up.  She was found to have extensive left lower extremity DVT.  So surgery was consulted.  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  Patient was started on heparin drip and hospitalist service.  Vascular surgery saw the patient and patient underwent IVUS of left lower femoral vein with percutaneous mechanical thrombectomy and stenting of the left leg pain for Metheney syndrome.    Subjective   Patient seen and examined, complains of pain in the left lower extremity.  Denies shortness of breath.  Platelet count is  154,000.   Assessment/Plan:     1. 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  Nplate is currently on hold.  Hematology/oncology has recommended Coumadin for anticoagulation given history of ITP.  Vascular surgery recommends starting oral anticoagulation.  We will start Coumadin per pharmacy today.  Patient will be on anticoagulation for at least 3 months or indefinitely if she still requires Nplate as per oncology. 2. History of chronic ITP-patient platelet count is 154 today.  Oncology planning to use rituximab to control ITP and also recommends IVIG with dexamethasone if platelet drop below 20,000 or 30,000. 3. Depression-continue home medications. 4. GERD-continue PPI 5. Morbid obesity-BMI greater than 40   Scheduled medications:   . aspirin EC  81 mg Oral Daily  . citalopram  10 mg Oral  Daily   Followed by  . [START ON 10/14/2019] citalopram  20 mg Oral Daily  . pantoprazole  40 mg Oral Daily  . polyethylene glycol  17 g Oral Daily  . sodium chloride flush  3 mL Intravenous Q12H     Antibiotics:   Anti-infectives (From admission, onward)   None      CBG: Recent Labs  Lab 10/07/19 1153  GLUCAP 116*    SpO2: 95 %    CBC: Recent Labs  Lab 10/07/19 0650 10/07/19 0700 10/08/19 0258 10/09/19 0242  WBC 20.0*  --  23.6* 19.0*  NEUTROABS 16.0*  --   --   --   HGB 12.1 13.6 11.6* 9.0*  HCT 39.1 40.0 36.2 29.2*  MCV 90.7  --  90.7 90.1  PLT 132*  --  130* 154    Basic Metabolic Panel: Recent Labs  Lab 10/07/19 0650 10/07/19 0700 10/08/19 0258 10/09/19 0242  NA 136 139 133* 137  K 4.0 4.1 3.9 3.6  CL 105 103 102 106  CO2 22  --  23 23  GLUCOSE 147* 146* 161* 133*  BUN 15 17 12 6   CREATININE 0.68 0.50 0.66 0.50  CALCIUM 9.3  --  9.0 8.1*     Liver Function Tests: Recent Labs  Lab 10/07/19 0650  AST 21  ALT 21  ALKPHOS 69  BILITOT 0.5  PROT 8.3*  ALBUMIN 3.3*      DVT prophylaxis: Patient on heparin  Code Status: Full code  Family Communication: No family at bedside    Status is: Inpatient  Dispo: The patient is from:  Home              Anticipated d/c is to: Home              Anticipated d/c date is: 10/12/2019              Patient currently on IV anticoagulation with heparin.  Barrier to discharge-we will need heparin and Coumadin bridge          Consultants:  Vascular surgery  Procedures:  IVUS of iliofemoral vein with percutaneous mechanical thrombectomy and stenting of left leg vein   Objective   Vitals:   10/09/19 0505 10/09/19 0826 10/09/19 1159 10/09/19 1531  BP: (!) 94/55 (!) 106/59 (!) 106/62 110/71  Pulse: 90 91 95 94  Resp: 19 20 18 20   Temp: 98.6 F (37 C) 99.4 F (37.4 C) 98.9 F (37.2 C) 98.9 F (37.2 C)  TempSrc: Oral Oral Oral Oral  SpO2: 98% 97% 96% 95%  Weight:      Height:         Intake/Output Summary (Last 24 hours) at 10/09/2019 1547 Last data filed at 10/09/2019 1545 Gross per 24 hour  Intake 3181.3 ml  Output 1600 ml  Net 1581.3 ml    07/20 1901 - 07/22 0700 In: 1928.5 [I.V.:1928.5] Out: -   Filed Weights   10/07/19 1439  Weight: 111.6 kg    Physical Examination:    General: Appears in no acute distress  Cardiovascular: S1-S2, regular, no murmur auscultated  Respiratory: Clear to auscultation bilaterally  Abdomen: Abdomen is soft, nontender, no organomegaly  Extremities: Left lower extremity is edematous, nontender  Neurologic: Alert, oriented x3, intact insight and judgment, no focal deficit noted    Data Reviewed:   Recent Results (from the past 240 hour(s))  SARS Coronavirus 2 by RT PCR (hospital order, performed in [facility] hospital lab) Nasopharyngeal Nasopharyngeal Swab     Status: None   Collection Time: 10/07/19  5:30 PM   Specimen: Nasopharyngeal Swab  Result Value Ref Range Status   SARS Coronavirus 2 NEGATIVE NEGATIVE Final    Comment: (NOTE) SARS-CoV-2 target nucleic acids are NOT DETECTED.  The SARS-CoV-2 RNA is generally detectable in upper and lower respiratory specimens during the acute phase of infection. The lowest concentration of SARS-CoV-2 viral copies this assay can detect is 250 copies / mL. A negative result does not preclude SARS-CoV-2 infection and should not be used as the sole basis for treatment or other patient management decisions.  A negative result may occur with improper specimen collection / handling, submission of specimen other than nasopharyngeal swab, presence of viral mutation(s) within the areas targeted by this assay, and inadequate number of viral copies (<250 copies / mL). A negative result must be combined with clinical observations, patient history, and epidemiological information.  Fact Sheet for Patients:   10/09/19  Fact Sheet for  Healthcare Providers: BoilerBrush.com.cy  This test is not yet approved or  cleared by the https://pope.com/ FDA and has been authorized for detection and/or diagnosis of SARS-CoV-2 by FDA under an Emergency Use Authorization (EUA).  This EUA will remain in effect (meaning this test can be used) for the duration of the COVID-19 declaration under Section 564(b)(1) of the Act, 21 U.S.C. section 360bbb-3(b)(1), unless the authorization is terminated or revoked sooner.  Performed at [facility], [address] [place], [place]      Studies:  DG Chest 2 View  Result Date: 10/07/2019 CLINICAL DATA:  44 year old female with left extremity pain. EXAM: CHEST - 2 VIEW COMPARISON:  Chest radiograph dated 04/20/2019. FINDINGS: There is borderline cardiomegaly with mild vascular congestion. No focal consolidation, pleural effusion, pneumothorax. No acute osseous pathology. Top-normal caliber small bowel loop in the left upper abdomen measure up to 3 cm. IMPRESSION: Borderline cardiomegaly with mild vascular congestion. No focal consolidation. Electronically Signed   By: Elgie Collard M.D.   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  Ultrasound-guided access of left popliteal vein 2.  IVUS (intra-vascular ultrasound) of left popliteal, superficial femoral, common femoral, external iliac, common iliac vein, and IVC 3.  Percutaneous mechanical thrombectomy of left popliteal, superficial femoral, common femoral, external iliac, common iliac vein and IVC (Innari ClotTriever) 4.  Left common/external iliac vein angioplasty with stent  placement (predilated with a 12 mm Atlas, left common iliac vein stented with a 16 mm x 90 mm Wallstent, postdilated with a 14 mm Atlas) 5.  Left femoral venogram 6.  74 minutes of monitored moderate conscious sedation time  Indications: 44 year old Hispanic female who presented with extensive left leg swelling and found to have large left iliofemoral DVT.  She did undergo CT venogram that suggested May Thurner with no other evidence of etiology for her DVT.  She presents today for planned percutaneous mechanical thrombectomy with likely stent placement in the left common iliac vein after IVUS.  Risks and benefits have been discussed.  Findings:  After popliteal access, IVUS confirmed mostly acute with some underlying chronic thrombus in the left popliteal, superficial femoral vein, common femoral vein, external iliac vein and common iliac vein with a small tongue of thrombus into the IVC.  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  Percutaneous mechanical thrombectomy was performed within Innari ClotTriever with 7 passes of the left popliteal, superficial femoral, common femoral, external iliac, and left common iliac vein and IVC and retrieved large volumes of mostly acute and some chronic appearing thrombus.  The left common iliac vein stenosis was then predilated with a 12 mm Atlas, stented with a 16 mm Wallstent, and postdilated with a 14 mm Atlas with good wall apposition no residual stenosis.             Procedure:  The patient was identified in the holding area and taken to room 8.  Patient was placed prone on the table.  The left popliteal fossa was then prepped and draped in usual sterile fashion.  A timeout was performed identify patient, procedure and site.  Initially used ultrasound and evaluated the left popliteal vein which was full of acute thrombus and this was accessed with a micro  access needle placed a microwire and then a micro sheath under ultrasound guidance.  I then performed a brief hand-injection to confirm I was in the popliteal vein with a venogram which I confirmed due to her size and depth of her vessels.  I then used a KMP catheter with a Glidewire advantage to cross the thrombus in the left femoral and iliac vein into the IVC and placed  my wire up into the subclavian vein.  An 8 French sheath was placed in the left popliteal vein and the patient was given 8000 units of IV heparin.  I then performed IVUS of the left popliteal, superficial femoral, common femoral, external iliac, common iliac vein and IVC.  Pertinent findings are noted above.  Ultimately elected to perform percutaneous mechanical thrombectomy and we upsized to the 13 [place] Innari sheath in the left popliteal vein.  We then used the [employer] and this was subsequently placed over the glidewire advantage up into the IVC where the catch bag was deployed and we made a total of 7 passes getting large volumes of acute thrombus with some chronic thrombus as well.  We ultimately performed IVUS again after the first 5 passes and then made 2 additional passes given evidence of additional thrombus.  After 7 passes the iliofemoral segment and IVC all looked fairly clean of thrombus.  We then used our IVUS to mark the iliac vein bifurcation as well as the 87% compression on the left common iliac vein.  This lesion was then predilated with a 14 mm Atlas.  We then selected a 16 mm x 90 mm Wallstent that was deployed into the distal IVC across the iliac vein stenosis in the left common iliac vein and into the left external iliac vein.  This was postdilated with a 14 mm Atlas.  We had good wall apposition on final IVUS with no evidence of residual thrombus and appeared to have good inflow and outflow in the stent.  Satisfied with the results wires and catheters were removed.  I tied a 4-0 Monocryl pursestring around the  Innari sheath that was removed from the popliteal vein and pressure was held.  Taken to holding in stable condition.  Plan: The patient should continue heparin tonight and and this should not be held and can be continued immediately.  Can be transitioned to a DOAC tomorrow if she does okay tonight.   Cephus Shelling, MD [facility] Office: [phone]  CT VENOGRAM ABD/PEL  Result Date: 10/07/2019 CLINICAL DATA:  44 year old with known left lower DVT. CT venogram ordered to evaluate the extent of the DVT. EXAM: CT VENOGRAM ABD-PELVIS TECHNIQUE: Multidetector CT imaging of the abdomen and pelvis was performed using the standard venogram protocol during bolus administration of intravenous contrast. Multiplanar reconstructed images and MIPs were obtained and reviewed to evaluate the vascular anatomy. CONTRAST:  OMNIPAQUE IOHEXOL 350 MG/ML SOLN COMPARISON:  03/13/2018 FINDINGS: VASCULAR Arterial structures: Normal caliber of the abdominal aorta without atherosclerotic calcifications. Small amount of calcification in the right common iliac artery. Bilateral iliac arteries are patent. Portal venous system: Portal venous system is patent. IVC: Small amount of nonocclusive thrombus along the left side of the distal IVC. This thrombus extends approximately 2.6 cm above the IVC bifurcation. Suprarenal IVC is patent. Bilateral renal veins are patent. Iliac veins: Thrombus and stranding involving the left common and left external iliac veins. Findings are suggestive for acute thrombus. At least mild compression on the left common iliac vein from the right common iliac artery. Right iliac veins appear to be patent. Evidence for thrombus in the proximal left femoral veins. Review of the MIP images confirms the above findings. NON-VASCULAR Lower chest: Lung bases are clear. Thrombus in left lower lobe segmental pulmonary arteries best seen on sequence 3, image 1. Limited evaluation  for pulmonary embolism on this examination. Hepatobiliary: Low-attenuation of the liver is suggestive for  hepatic steatosis. Normal appearance of the gallbladder. No biliary dilatation. Pancreas: Unremarkable. No pancreatic ductal dilatation or surrounding inflammatory changes. Spleen: Surgically removed. Adrenals/Urinary Tract: Normal appearance of the adrenal glands. Normal appearance of both kidneys without hydronephrosis. No suspicious renal lesions. Urinary bladder is mildly distended. Stomach/Bowel: Normal appearance of the stomach. No evidence for bowel obstruction or focal bowel inflammation. Lymphatic: But cannot exclude prominent lymph nodes in the left lower iliac region but these may be reactive. Extensive edema along the left iliac chain related to the DVT also limits evaluation. Otherwise, no significant lymph node enlargement in the abdomen or pelvis. Reproductive: 3.3 cm low-density structure associated with the left ovary likely represents a cyst. Probable right ovarian follicle measuring 2.1 cm. Uterus is unremarkable. Other: Extensive edema and stranding along the left iliac vessels compatible with the acute DVT. Negative for free fluid. Negative for free air. Musculoskeletal: No acute bone abnormality. IMPRESSION: VASCULAR 1. Positive for deep venous thrombosis involving the distal IVC, left iliac veins and proximal left femoral veins. The distal IVC thrombus is nonocclusive. 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 2. Pulmonary embolism in the left lower lobe. Incomplete evaluation of the pulmonary embolism on this examination. NON-VASCULAR 1. Hepatic steatosis. 2. Probable left ovarian cyst. 3. Extensive stranding along the left side of the pelvis related to the left-sided DVT. These results were called by telephone at the time of interpretation on 10/07/2019 at 5:17 pm to Dr. Charm BargesButler in the emergency department, Who verbally  acknowledged these results. Electronically Signed   By: Richarda OverlieAdam  Henn M.D.   On: 10/07/2019 17:23       Neev Mcmains S Malala Trenkamp   [facility] If 7PM-7AM, please contact night-coverage at [email or web address], Office  [phone]   10/09/2019, 3:47 PM  LOS: 2 days

## 2019-10-09 NOTE — Progress Notes (Addendum)
ANTICOAGULATION CONSULT NOTE - Follow Up Consult  Pharmacy Consult for Heparin Indication: DVT  No Known Allergies  Patient Measurements: Height: 4\' 9"  (144.8 cm) Weight: 111.6 kg (246 lb) IBW/kg (Calculated) : 38.6 Heparin Dosing Weight: 73 kg  Vital Signs: Temp: 98.6 F (37 C) (07/22 0505) Temp Source: Oral (07/22 0505) BP: 94/55 (07/22 0505) Pulse Rate: 90 (07/22 0505)  Labs: Recent Labs    10/07/19 0650 10/07/19 0650 10/07/19 0700 10/07/19 2149 10/08/19 0258 10/08/19 0258 10/08/19 1203 10/08/19 2210 10/09/19 0242  HGB 12.1   < > 13.6  --  11.6*  --   --   --  9.0*  HCT 39.1   < > 40.0  --  36.2  --   --   --  29.2*  PLT 132*  --   --   --  130*  --   --   --  154  APTT 32  --   --   --   --   --   --   --   --   LABPROT 13.5  --   --   --   --   --   --   --   --   INR 1.1  --   --   --   --   --   --   --   --   HEPARINUNFRC  --   --   --    < > 0.27*   < > 0.35 0.50 0.37  CREATININE 0.68   < > 0.50  --  0.66  --   --   --  0.50   < > = values in this interval not displayed.    Estimated Creatinine Clearance: 96.1 mL/min (by C-G formula based on SCr of 0.5 mg/dL).   Medications:  Infusions:  . sodium chloride 100 mL/hr at 10/09/19 0400  . sodium chloride    . heparin 1,450 Units/hr (10/09/19 0400)    Assessment: 44 year of age patient diagnosed with acute DVT and started on IV Heparin therapy.  Now s/p thrombectomy and stent  Heparin to continue post procedure Heparin level therapeutic at 0.5 overnight, 0.37 this AM at 6-hr level  Goal of Therapy:  Heparin level 0.3-0.7 units/ml Monitor platelets by anticoagulation protocol: Yes   Plan:  Continue Heparin at 1450 units / hr Repeat heparin level tomorrow AM Oncology recommending Warfarin -- Consider starting soon as will need 5 day overlap.  58, PharmD PGY1 Acute Care Pharmacy Resident Please refer to [facility] for unit-specific pharmacist  I discussed / reviewed the pharmacy note  by Dr. [facility] and I agree with the resident's findings and plans as documented.  Thank you  Anselmo Rod, PharmD  Addendum:  Coumadin is ordered to be started tonight. Baseline INR 1.1. She is also obese so we will start with 10mg  x1 for tonight. Daily INR  Okey Regal, PharmD, [place], AAHIVP, CPP Infectious Disease Pharmacist 10/09/2019 4:00 PM

## 2019-10-09 NOTE — Progress Notes (Signed)
Compression sock put on per order.   Lawson Radar, RN

## 2019-10-09 NOTE — Progress Notes (Signed)
[facility]  Subjective  -no complaints.  Still having some left leg swelling.   Objective (!) 106/59 91 99.4 F (37.4 C) (Oral) 20 97%  Intake/Output Summary (Last 24 hours) at 10/09/2019 0910 Last data filed at 10/09/2019 0827 Gross per 24 hour  Intake 1928.53 ml  Output 900 ml  Net 1028.53 ml    Left leg edema somewhat improved. Left popliteal access site no hematoma.  Laboratory Lab Results: Recent Labs    10/08/19 0258 10/09/19 0242  WBC 23.6* 19.0*  HGB 11.6* 9.0*  HCT 36.2 29.2*  PLT 130* 154   BMET Recent Labs    10/08/19 0258 10/09/19 0242  NA 133* 137  K 3.9 3.6  CL 102 106  CO2 23 23  GLUCOSE 161* 133*  BUN 12 6  CREATININE 0.66 0.50  CALCIUM 9.0 8.1*    COAG Lab Results  Component Value Date   INR 1.1 10/07/2019   INR 1.1 04/08/2019   INR 1.0 03/02/2019   No results found for: PTT  Assessment/Planning: POD #1 status post IVUS of left iliofemoral vein with percutaneous mechanical thrombectomy and stenting of the left iliac vein for May Thurner.  She has done well overnight.  Hgb 11.6 --> 9.0.  We have ordered a compression sock for her left leg that she should wear all the time.  Can be transitioned from heparin to a DOAC but would defer to hematology if they have a preference given her ITP.  Will arrange follow-up in 1 month with [place] in the office with left iliac vein duplex.  Can be discharged from my standpoint if there is no other issues per medicine or heme-onc.  Cephus Shelling 10/09/2019 9:10 AM --

## 2019-10-09 NOTE — Progress Notes (Addendum)
[name]   DOB:05-21-1975   DX#:[id number]   ZJQ#:[id number]  Hematology follow up   Subjective: Still with left leg pain and swelling, but improved. Now has compression stocking in place. Stated that she tried to get up a little bit today and felt dizzy. She has no other complaints today.  Objective:  Vitals:   10/09/19 1159 10/09/19 1531  BP: (!) 106/62 110/71  Pulse: 95 94  Resp: 18 20  Temp: 98.9 F (37.2 C) 98.9 F (37.2 C)  SpO2: 96% 95%    Body mass index is 53.23 kg/m.  Intake/Output Summary (Last 24 hours) at 10/09/2019 1554 Last data filed at 10/09/2019 1545 Gross per 24 hour  Intake 3181.3 ml  Output 1600 ml  Net 1581.3 ml     Sclerae unicteric  Oropharynx clear  No peripheral adenopathy  Lungs clear -- no rales or rhonchi  Heart regular rate and rhythm  Abdomen benign  MSK: (+) Left lower extremity edema up to thigh, improved, compression stocking in place  CBG (last 3)  Recent Labs    10/07/19 1153  GLUCAP 116*     Labs:  Urine Studies No results for input(s): UHGB, CRYS in the last 72 hours.  Invalid input(s): UACOL, UAPR, USPG, UPH, UTP, UGL, UKET, UBIL, UNIT, UROB, ULEU, UEPI, UWBC, URBC, UBAC, CAST, [place], [place]  Basic Metabolic Panel: Recent Labs  Lab 10/07/19 0650 10/07/19 0650 10/07/19 0700 10/07/19 0700 10/08/19 0258 10/09/19 0242  NA 136  --  139  --  133* 137  K 4.0   < > 4.1   < > 3.9 3.6  CL 105  --  103  --  102 106  CO2 22  --   --   --  23 23  GLUCOSE 147*  --  146*  --  161* 133*  BUN 15  --  17  --  12 6  CREATININE 0.68  --  0.50  --  0.66 0.50  CALCIUM 9.3  --   --   --  9.0 8.1*   < > = values in this interval not displayed.   GFR Estimated Creatinine Clearance: 96.1 mL/min (by C-G formula based on SCr of 0.5 mg/dL). Liver Function Tests: Recent Labs  Lab 10/07/19 0650  AST 21  ALT 21  ALKPHOS 69  BILITOT 0.5  PROT 8.3*  ALBUMIN 3.3*   No results for input(s): LIPASE, AMYLASE in the last 168  hours. No results for input(s): AMMONIA in the last 168 hours. Coagulation profile Recent Labs  Lab 10/07/19 0650  INR 1.1    CBC: Recent Labs  Lab 10/07/19 0650 10/07/19 0700 10/08/19 0258 10/09/19 0242  WBC 20.0*  --  23.6* 19.0*  NEUTROABS 16.0*  --   --   --   HGB 12.1 13.6 11.6* 9.0*  HCT 39.1 40.0 36.2 29.2*  MCV 90.7  --  90.7 90.1  PLT 132*  --  130* 154   Cardiac Enzymes: No results for input(s): CKTOTAL, CKMB, CKMBINDEX, TROPONINI in the last 168 hours. BNP: Invalid input(s): POCBNP CBG: Recent Labs  Lab 10/07/19 1153  GLUCAP 116*   D-Dimer No results for input(s): DDIMER in the last 72 hours. Hgb A1c Recent Labs    10/08/19 0258  HGBA1C 7.2*   Lipid Profile No results for input(s): CHOL, HDL, LDLCALC, TRIG, CHOLHDL, LDLDIRECT in the last 72 hours. Thyroid function studies No results for input(s): TSH, T4TOTAL, T3FREE, THYROIDAB in the last 72 hours.  Invalid input(s):  FREET3 Anemia work up No results for input(s): VITAMINB12, FOLATE, FERRITIN, TIBC, IRON, RETICCTPCT in the last 72 hours. Microbiology Recent Results (from the past 240 hour(s))  SARS Coronavirus 2 by RT PCR (hospital order, performed in [facility] hospital lab) Nasopharyngeal Nasopharyngeal Swab     Status: None   Collection Time: 10/07/19  5:30 PM   Specimen: Nasopharyngeal Swab  Result Value Ref Range Status   SARS Coronavirus 2 NEGATIVE NEGATIVE Final    Comment: (NOTE) SARS-CoV-2 target nucleic acids are NOT DETECTED.  The SARS-CoV-2 RNA is generally detectable in upper and lower respiratory specimens during the acute phase of infection. The lowest concentration of SARS-CoV-2 viral copies this assay can detect is 250 copies / mL. A negative result does not preclude SARS-CoV-2 infection and should not be used as the sole basis for treatment or other patient management decisions.  A negative result may occur with improper specimen collection / handling, submission of  specimen other than nasopharyngeal swab, presence of viral mutation(s) within the areas targeted by this assay, and inadequate number of viral copies (<250 copies / mL). A negative result must be combined with clinical observations, patient history, and epidemiological information.  Fact Sheet for Patients:   BoilerBrush.com.cy  Fact Sheet for Healthcare Providers: https://pope.com/  This test is not yet approved or  cleared by the [place] FDA and has been authorized for detection and/or diagnosis of SARS-CoV-2 by FDA under an Emergency Use Authorization (EUA).  This EUA will remain in effect (meaning this test can be used) for the duration of the COVID-19 declaration under Section 564(b)(1) of the Act, 21 U.S.C. section 360bbb-3(b)(1), unless the authorization is terminated or revoked sooner.  Performed at [facility], [address] [place], [place] [address]       Studies:  DG Chest 2 View  Result Date: 10/07/2019 CLINICAL DATA:  44 year old female with left extremity pain. EXAM: CHEST - 2 VIEW COMPARISON:  Chest radiograph dated 04/20/2019. FINDINGS: There is borderline cardiomegaly with mild vascular congestion. No focal consolidation, pleural effusion, pneumothorax. No acute osseous pathology. Top-normal caliber small bowel loop in the left upper abdomen measure up to 3 cm. IMPRESSION: Borderline cardiomegaly with mild vascular congestion. No focal consolidation. Electronically Signed   By: Elgie Collard M.D.   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  Ultrasound-guided access of  left popliteal vein 2.  IVUS (intra-vascular ultrasound) of left popliteal, superficial femoral, common femoral, external iliac, common iliac vein, and IVC 3.  Percutaneous mechanical thrombectomy of left popliteal, superficial femoral, common femoral, external iliac, common iliac vein and IVC (Innari ClotTriever) 4.  Left common/external iliac vein angioplasty with stent placement (predilated with a 12 mm Atlas, left common iliac vein stented with a 16 mm x 90 mm Wallstent, postdilated with a 14 mm Atlas) 5.  Left femoral venogram 6.  74 minutes of monitored moderate conscious sedation time  Indications: 44 year old Hispanic female who presented with extensive left leg swelling and found to have large left iliofemoral DVT.  She did undergo CT venogram that suggested May Thurner with no other evidence of etiology for her  DVT.  She presents today for planned percutaneous mechanical thrombectomy with likely stent placement in the left common iliac vein after IVUS.  Risks and benefits have been discussed.  Findings:  After popliteal access, IVUS confirmed mostly acute with some underlying chronic thrombus in the left popliteal, superficial femoral vein, common femoral vein, external iliac vein and common iliac vein with a small tongue of thrombus into the IVC.  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  Percutaneous mechanical thrombectomy was performed within Innari ClotTriever with 7 passes of the left popliteal, superficial femoral, common femoral, external iliac, and left common iliac vein and IVC and retrieved large volumes of mostly acute and some chronic appearing thrombus.  The left common iliac vein stenosis was then predilated with a 12 mm Atlas, stented with a 16 mm Wallstent, and postdilated with a 14 mm Atlas with good wall apposition no residual stenosis.             Procedure:  The  patient was identified in the holding area and taken to room 8.  Patient was placed prone on the table.  The left popliteal fossa was then prepped and draped in usual sterile fashion.  A timeout was performed identify patient, procedure and site.  Initially used ultrasound and evaluated the left popliteal vein which was full of acute thrombus and this was accessed with a micro access needle placed a microwire and then a micro sheath under ultrasound guidance.  I then performed a brief hand-injection to confirm I was in the popliteal vein with a venogram which I confirmed due to her size and depth of her vessels.  I then used a KMP catheter with a Glidewire advantage to cross the thrombus in the left femoral and iliac vein into the IVC and placed my wire up into the subclavian vein.  An 8 French sheath was placed in the left popliteal vein and the patient was given 8000 units of IV heparin.  I then performed IVUS of the left popliteal, superficial femoral, common femoral, external iliac, common iliac vein and IVC.  Pertinent findings are noted above.  Ultimately elected to perform percutaneous mechanical thrombectomy and we upsized to the 13 [place] Innari sheath in the left popliteal vein.  We then used the [employer] and this was subsequently placed over the glidewire advantage up into the IVC where the catch bag was deployed and we made a total of 7 passes getting large volumes of acute thrombus with some chronic thrombus as well.  We ultimately performed IVUS again after the first 5 passes and then made 2 additional passes given evidence of additional thrombus.  After 7 passes the iliofemoral segment and IVC all looked fairly clean of thrombus.  We then used our IVUS to mark the iliac vein bifurcation as well as the 87% compression on the left common iliac vein.  This lesion was then predilated with a 14 mm Atlas.  We then selected a 16 mm x 90 mm Wallstent that was deployed into the distal IVC across the  iliac vein stenosis in the left common iliac vein and into the left external iliac vein.  This was postdilated with a 14 mm Atlas.  We had good wall apposition on final IVUS with no evidence of residual thrombus and appeared to have good inflow and outflow in the stent.  Satisfied with the results wires and catheters were removed.  I tied a 4-0 Monocryl pursestring around the Innari sheath that was removed from the popliteal vein and pressure was held.  Taken to holding in stable condition.  Plan: The patient should continue heparin tonight and and this should not be held and can be continued immediately.  Can be transitioned to a DOAC tomorrow if she does okay tonight.   Cephus Shellinghristopher J. Clark, MD [facility] Office: [phone]  CT VENOGRAM ABD/PEL  Result Date: 10/07/2019 CLINICAL DATA:  44 year old with known left lower DVT. CT venogram ordered to evaluate the extent of the DVT. EXAM: CT VENOGRAM ABD-PELVIS TECHNIQUE: Multidetector CT imaging of the abdomen and pelvis was performed using the standard venogram protocol during bolus administration of intravenous contrast. Multiplanar reconstructed images and MIPs were obtained and reviewed to evaluate the vascular anatomy. CONTRAST:  100mL OMNIPAQUE IOHEXOL 350 MG/ML SOLN COMPARISON:  03/13/2018 FINDINGS: VASCULAR Arterial structures: Normal caliber of the abdominal aorta without atherosclerotic calcifications. Small amount of calcification in the right common iliac artery. Bilateral iliac arteries are patent. Portal venous system: Portal venous system is patent. IVC: Small amount of nonocclusive thrombus along the left side of the distal IVC. This thrombus extends approximately 2.6 cm above the IVC bifurcation. Suprarenal IVC is patent. Bilateral renal veins are patent. Iliac veins: Thrombus and stranding involving the left common and left external iliac veins. Findings are suggestive for acute thrombus. At least mild  compression on the left common iliac vein from the right common iliac artery. Right iliac veins appear to be patent. Evidence for thrombus in the proximal left femoral veins. Review of the MIP images confirms the above findings. NON-VASCULAR Lower chest: Lung bases are clear. Thrombus in left lower lobe segmental pulmonary arteries best seen on sequence 3, image 1. Limited evaluation for pulmonary embolism on this examination. Hepatobiliary: Low-attenuation of the liver is suggestive for hepatic steatosis. Normal appearance of the gallbladder. No biliary dilatation. Pancreas: Unremarkable. No pancreatic ductal dilatation or surrounding inflammatory changes. Spleen: Surgically removed. Adrenals/Urinary Tract: Normal appearance of the adrenal glands. Normal appearance of both kidneys without hydronephrosis. No suspicious renal lesions. Urinary bladder is mildly distended. Stomach/Bowel: Normal appearance of the stomach. No evidence for bowel obstruction or focal bowel inflammation. Lymphatic: But cannot exclude prominent lymph nodes in the left lower iliac region but these may be reactive. Extensive edema along the left iliac chain related to the DVT also limits evaluation. Otherwise, no significant lymph node enlargement in the abdomen or pelvis. Reproductive: 3.3 cm low-density structure associated with the left ovary likely represents a cyst. Probable right ovarian follicle measuring 2.1 cm. Uterus is unremarkable. Other: Extensive edema and stranding along the left iliac vessels compatible with the acute DVT. Negative for free fluid. Negative for free air. Musculoskeletal: No acute bone abnormality. IMPRESSION: VASCULAR 1. Positive for deep venous thrombosis involving the distal IVC, left iliac veins and proximal left femoral veins. The distal IVC thrombus is nonocclusive. 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 2. Pulmonary embolism in the left lower  lobe. Incomplete evaluation of the pulmonary embolism on this examination. NON-VASCULAR 1. Hepatic steatosis. 2. Probable left ovarian cyst. 3. Extensive stranding along the left side of the pelvis related to the left-sided DVT. These results were called by telephone at the time of interpretation on 10/07/2019 at 5:17 pm to Dr. Charm BargesButler in the emergency department, Who verbally acknowledged these results. Electronically Signed   By: Meriel PicaAdam  Henn M.D.  On: 10/07/2019 17:23    Assessment: 44 y.o. female   1. 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  History of mild subarachnoid hemorrhage 4.  Obesity 5. Chronic headaches and dizziness  6. Depression     Plan:  -Status post percutaneous mechanical thrombectomy and left external iliac stent placement by vascular surgeon Dr. Chestine Spore 10/08/2019 with improvement of her left leg pain and swelling. -I think her high dose Nplate may have contributed to her hypercoagulopathy, plan to hold it for now, I am hoping rituximab would control her ITP well, plan to start as soon as she is discharged  -will give IVIG and dexa if her plt drop below 20 or 30K  -if she has recurrent severe ITP may need to consider low-dose Nplate in the future -She will be on anticoagulation for at least 3 months, or indefinite if she still requires Nplate. This will be complicated by her frequent ITP flare, which will require holding anticoagulation when her platelet count drop below 50K, and some time may need reverse her A/C if she has clinical significant bleeding from severe thrombocytopenia. Recommend starting the patient on warfarin due to its easy reversibility. Would recommend against keeping her in the hospital until warfarin is therapeutic. The patient may be discharged home on Lovenox as she transition to warfarin and we can discontinue Lovenox once INR is  therapeutic. -If she does have frequent ITP flare require interruption of anticoagulation, she may need IVC filter placement -will continue f/u tomorrow   Clenton Pare, NP 10/09/2019  3:54 PM   Addendum  I have seen the patient, examined her. I agree with the assessment and and plan and have edited the notes.   I again discussed the options of A/C. She is agreeable with coumadin. I explained to her that she needs at least 5 days bridging a/c with iv heparin or lovenox if she desires to be discharged home sooner. Please check if she can get lovenox at affordable cost, so she can go home tomorrow or this weekend. If she does, then I can arrange her lab PT/INR to be checked in my office early next week.   I plan to start her on Rixuzumab next week for her ITP. Will hold Nplate for now.   Malachy Mood  10/09/2019

## 2019-10-09 NOTE — Progress Notes (Signed)
Mobility Specialist - Progress Note   10/09/19 1650  Mobility  Activity Transferred:  Bed to chair  Level of Assistance Contact guard assist, steadying assist  Assistive Device None  Distance Ambulated (ft) 2 ft  Mobility Response Tolerated well  Mobility performed by Mobility specialist  $Mobility charge 1 Mobility    Pre-mobility: 90 HR During mobility: 104 HR Post-mobility: 93 HR  Pt held on to me for stability as she walked to her chair.  Mamie Levers Mobility Specialist Mobility Specialist Phone: [phone]

## 2019-10-10 ENCOUNTER — Telehealth: Payer: Self-pay | Admitting: Hematology

## 2019-10-10 ENCOUNTER — Other Ambulatory Visit: Payer: Self-pay | Admitting: Oncology

## 2019-10-10 DIAGNOSIS — I82402 Acute embolism and thrombosis of unspecified deep veins of left lower extremity: Secondary | ICD-10-CM

## 2019-10-10 DIAGNOSIS — D693 Immune thrombocytopenic purpura: Secondary | ICD-10-CM

## 2019-10-10 LAB — BASIC METABOLIC PANEL
Anion gap: 7 (ref 5–15)
BUN: <5 mg/dL — ABNORMAL LOW (ref 6–20)
CO2: 25 mmol/L (ref 22–32)
Calcium: 8.1 mg/dL — ABNORMAL LOW (ref 8.9–10.3)
Chloride: 105 mmol/L (ref 98–111)
Creatinine, Ser: 0.62 mg/dL (ref 0.44–1.00)
GFR calc Af Amer: >60 mL/min (ref >60)
GFR calc non Af Amer: >60 mL/min (ref >60)
Glucose, Bld: 138 mg/dL — ABNORMAL HIGH (ref 70–99)
Potassium: 3.5 mmol/L (ref 3.5–5.1)
Sodium: 137 mmol/L (ref 135–145)

## 2019-10-10 LAB — PROTIME-INR
INR: 1.1 (ref 0.8–1.2)
Prothrombin Time: 13.9 seconds (ref 11.4–15.2)

## 2019-10-10 LAB — CBC
HCT: 28.8 % — ABNORMAL LOW (ref 36.0–46.0)
Hemoglobin: 9.1 g/dL — ABNORMAL LOW (ref 12.0–15.0)
MCH: 28.7 pg (ref 26.0–34.0)
MCHC: 31.6 g/dL (ref 30.0–36.0)
MCV: 90.9 fL (ref 80.0–100.0)
Platelets: 157 10*3/uL (ref 150–400)
RBC: 3.17 MIL/uL — ABNORMAL LOW (ref 3.87–5.11)
RDW: 14.6 % (ref 11.5–15.5)
WBC: 17.1 10*3/uL — ABNORMAL HIGH (ref 4.0–10.5)
nRBC: 3.6 % — ABNORMAL HIGH (ref 0.0–0.2)

## 2019-10-10 LAB — HEPARIN LEVEL (UNFRACTIONATED): Heparin Unfractionated: 0.49 IU/mL (ref 0.30–0.70)

## 2019-10-10 LAB — PATHOLOGIST SMEAR REVIEW

## 2019-10-10 MED ORDER — WARFARIN SODIUM 5 MG PO TABS
5.0000 mg | ORAL_TABLET | Freq: Every day | ORAL | 2 refills | Status: DC
Start: 2019-10-11 — End: 2019-10-20

## 2019-10-10 MED ORDER — ENOXAPARIN SODIUM 150 MG/ML ~~LOC~~ SOLN: 150.0000 mg | SUBCUTANEOUS | 0 refills | Status: DC

## 2019-10-10 MED ORDER — ENOXAPARIN SODIUM 150 MG/ML ~~LOC~~ SOLN
150.0000 mg | SUBCUTANEOUS | Status: DC
  Administered 2019-10-10: 150 mg via SUBCUTANEOUS
  Filled 2019-10-10: qty 1

## 2019-10-10 MED ORDER — ASPIRIN 81 MG PO TBEC: 81.0000 mg | DELAYED_RELEASE_TABLET | Freq: Every day | ORAL | 11 refills | Status: DC

## 2019-10-10 MED ORDER — COUMADIN BOOK
Freq: Once | Status: AC
  Filled 2019-10-10: qty 1

## 2019-10-10 MED ORDER — WARFARIN SODIUM 10 MG PO TABS: 10.0000 mg | ORAL_TABLET | Freq: Once | ORAL | Status: DC

## 2019-10-10 MED ORDER — WARFARIN SODIUM 10 MG PO TABS
10.0000 mg | ORAL_TABLET | Freq: Once | ORAL | Status: AC
  Administered 2019-10-10: 10 mg via ORAL
  Filled 2019-10-10: qty 1

## 2019-10-10 MED FILL — ASPIRIN LOW DOSE 81 MG TBEC: 81 | 30 days supply | Qty: 30 | Fill #0

## 2019-10-10 MED FILL — WARFARIN SODIUM 5 MG TABLET: 5 | 30 days supply | Qty: 30 | Fill #0

## 2019-10-10 MED FILL — ENOXAPARIN SODIUM 150 MG/ML: 150 | 5 days supply | Qty: 5 | Fill #0

## 2019-10-10 NOTE — Progress Notes (Signed)
Pt ambulated approx 215ft in hall with front wheel walker.  Complained of tightness in her left hip/thigh but ultimately stopped short due to SOB.  Back to recliner upon returning to room.  SpO2 >90% on RA.  No further needs expressed at this time. Family at bedside and call bell within reach.

## 2019-10-10 NOTE — Telephone Encounter (Signed)
Scheduled appt per 7/23 sch msg - pt daughter is aware of appt

## 2019-10-10 NOTE — Discharge Summary (Addendum)
Physician Discharge Summary  [name] XIH:[id number] DOB: 15-May-1975 DOA: 10/07/2019  PCP: Malachy Mood, MD  Admit date: 10/07/2019 Discharge date: 10/10/2019  Time spent: 50* minutes  Recommendations for Outpatient Follow-up:  1. Follow-up oncology on Tuesday, 10/14/2019 2. Continue Lovenox and Coumadin, PT/INR will be checked at oncology office on 10/14/2019  Discharge Diagnoses:  Principal Problem:   DVT (deep venous thrombosis) (HCC) Active Problems:   Chronic hepatitis B (HCC)   Chronic ITP (idiopathic thrombocytopenia) (HCC)   Morbid obesity (HCC)   Depression   GERD (gastroesophageal reflux disease)   May-Thurner syndrome   Discharge Condition: Stable  Diet recommendation: Regular diet  Filed Weights   10/07/19 1439  Weight: 111.6 kg    History of present illness:  44 year old female with medical history of morbid obesity, chronic ITP came to ED with complaints of sudden onset left leg and arm swelling for 1 week.  Her symptoms progressed from left arm to include left leg causing significant pain and standing up.  She was found to have extensive left lower extremity DVT.  So surgery was consulted.  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  Patient was started on heparin drip and hospitalist service.  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  Nplate is currently on hold.  Hematology/oncology has recommended Coumadin for anticoagulation given history of ITP.  Vascular surgery recommends starting oral anticoagulation.  We will start Coumadin per pharmacy  today.  Patient will be on anticoagulation for at least 3 months or indefinitely if she still requires Nplate as per oncology.  Called and discussed with Dr. Mosetta Putt, she recommends starting Lovenox 150 mg subcu daily for 5 days.  Also will discharge patient on Coumadin 5 mg p.o. daily.  Dr. Mosetta Putt will see patient on Tuesday, 10/14/2019 in office and check PT/INR and adjust the dose of Coumadin and Lovenox as needed.  2. Left iliac vein stent placement-patient had stent placement in left iliac vein for May Thurner syndrome.  Continue aspirin.  Patient will follow up with vascular surgery in 1 month. 3. History of chronic ITP-patient platelet count is 157,000 today.  Oncology planning to use rituximab to control ITP and also recommends IVIG with dexamethasone if platelet drop below 20,000 or 30,000. 4. Depression-continue home medications. 5. GERD-continue PPI 6. Morbid obesity-BMI greater than 40  Procedures:  Iliofemoral percutaneous mechanical thrombectomy with IVUS and left common iliac vein stent for May Thurner syndrome  Consultations:  Vascular surgery  Discharge Exam: Vitals:   10/10/19 0336 10/10/19 0747  BP: 91/73 (!) 109/59  Pulse:  84  Resp: 16 18  Temp: 99.1 F (37.3 C) 98.8 F (37.1 C)  SpO2: 96% 98%    General: Appears in no acute distress Cardiovascular: S1-S2, regular Respiratory: Clear to auscultation bilaterally  Discharge Instructions   Discharge Instructions    Diet - low sodium heart healthy   Complete by: As directed    Increase activity slowly   Complete by: As directed      Allergies as of 10/10/2019   No Known Allergies     Medication List    STOP taking these  medications   acetaminophen 500 MG tablet Commonly known as: TYLENOL   dexamethasone 4 MG tablet Commonly known as: DECADRON     TAKE these medications   aspirin 81 MG EC tablet Take 1 tablet (81 mg total) by mouth daily. Swallow whole. Start taking on: October 11, 2019   citalopram 10  MG tablet Commonly known as: CELEXA Take 10-20 mg by mouth See admin instructions. Take 10mg  x7days then increase to 20mg  started 10/01/19   enoxaparin 150 MG/ML injection Commonly known as: LOVENOX Inject 1 mL (150 mg total) into the skin daily for 5 days. Start taking on: October 11, 2019   HYDROcodone-acetaminophen 5-325 MG tablet Commonly known as: NORCO/VICODIN Take 1 tablet by mouth every 4 (four) hours as needed for severe pain.   meclizine 25 MG tablet Commonly known as: ANTIVERT Take 1 tablet (25 mg total) by mouth 3 (three) times daily as needed for dizziness. What changed: when to take this   pantoprazole 40 MG tablet Commonly known as: PROTONIX Take 1 tablet (40 mg total) by mouth daily. What changed:   when to take this  reasons to take this   warfarin 5 MG tablet Commonly known as: Coumadin Take 1 tablet (5 mg total) by mouth daily. Start taking on: October 11, 2019      No Known Allergies  Follow-up Information    October 13, 2019, MD Follow up in 1 month(s).   Specialty: Vascular Surgery Why: the office will contact the patient with appointment (sent) Contact information: [address] [place] [phone]        [id number], MD Follow up on 10/14/2019.   Specialties: Hematology, Oncology Why: Call to confirm appointment Contact information: [address] [place] [address] [place] [phone]                The results of significant diagnostics from this hospitalization (including imaging, microbiology, ancillary and laboratory) are listed below for reference.    Significant Diagnostic Studies: DG Chest 2 View  Result Date: 10/07/2019 CLINICAL DATA:  44 year old female with left extremity pain. EXAM: CHEST - 2 VIEW COMPARISON:  Chest radiograph dated 04/20/2019. FINDINGS: There is borderline cardiomegaly with mild vascular congestion. No focal consolidation, pleural effusion, pneumothorax. No acute osseous pathology.  Top-normal caliber small bowel loop in the left upper abdomen measure up to 3 cm. IMPRESSION: Borderline cardiomegaly with mild vascular congestion. No focal consolidation. Electronically Signed   By: 58 M.D.   On: 10/07/2019 19:13   CT HEAD WO CONTRAST  Result Date: 10/07/2019 CLINICAL DATA:  Provided history: Possible stroke; transient ischemic attack (TIA). Additional history provided: Pain in left arm, leg, left side of body, numbness, swelling of left leg, headache. EXAM: CT HEAD WITHOUT CONTRAST TECHNIQUE: Contiguous axial images were obtained from the base of the skull through the vertex without intravenous contrast. COMPARISON:  Prior head CT examinations 09/19/2019 and earlier, brain MRI 09/21/2015. FINDINGS: Brain: Cerebral volume is normal for age. There is no acute intracranial hemorrhage. No demarcated cortical infarct. No extra-axial fluid collection. No evidence of intracranial mass. No midline shift. Unchanged small scattered foci of parenchymal calcification Redemonstrated partially empty sella turcica. Vascular: No hyperdense vessel. Skull: Normal. Negative for fracture or focal lesion. Sinuses/Orbits: Visualized orbits show no acute finding. Mild right maxillary sinus mucosal thickening. No significant mastoid effusion. IMPRESSION: No CT evidence of acute intracranial abnormality. Redemonstrated partially empty sella turcica. This is very commonly an incidental finding, but can be associated with  idiopathic intracranial hypertension. Unchanged small nonspecific scattered parenchymal calcifications. Mild right maxillary sinus mucosal thickening. Electronically Signed   By: Jackey Loge DO   On: 10/07/2019 07:35   CT Head Wo Contrast  Result Date: 09/19/2019 CLINICAL DATA:  Headache, low platelets, normal neuro exam. Additional provided: Patient reports headache which began yesterday. EXAM: CT HEAD WITHOUT CONTRAST TECHNIQUE: Contiguous axial images were obtained from the base  of the skull through the vertex without intravenous contrast. COMPARISON:  Prior head CT 03/02/2019 FINDINGS: Brain: Cerebral volume is normal for age. There is no acute intracranial hemorrhage. No demarcated cortical infarct. No extra-axial fluid collection. No evidence of intracranial mass. No midline shift. Redemonstrated partially empty sella turcica. Vascular: No hyperdense vessel. Skull: Normal. Negative for fracture or focal lesion. Sinuses/Orbits: Visualized orbits show no acute finding. No significant paranasal sinus disease or mastoid effusion at the imaged levels. IMPRESSION: No CT evidence of acute intracranial abnormality. Redemonstrated partially empty sella turcica. This is very commonly an incidental finding, but can be associated with idiopathic intracranial hypertension. Unchanged small scattered nonspecific parenchymal calcifications. Electronically Signed   By: Jackey Loge DO   On: 09/19/2019 18:57   PERIPHERAL VASCULAR CATHETERIZATION  Result Date: 10/08/2019 Patient name: Brytnee Bechler    MRN: [id number]        DOB: [birth year]            Sex: female  10/08/2019 Pre-operative Diagnosis: Extensive left iliofemoral DVT with underlying May Thurner Syndrome Post-operative diagnosis:  Same Surgeon:  Cephus Shelling, MD Procedure Performed: 1.  Ultrasound-guided access of left popliteal vein 2.  IVUS (intra-vascular ultrasound) of left popliteal, superficial femoral, common femoral, external iliac, common iliac vein, and IVC 3.  Percutaneous mechanical thrombectomy of left popliteal, superficial femoral, common femoral, external iliac, common iliac vein and IVC (Innari ClotTriever) 4.  Left common/external iliac vein angioplasty with stent placement (predilated with a 12 mm Atlas, left common iliac vein stented with a 16 mm x 90 mm Wallstent, postdilated with a 14 mm Atlas) 5.  Left femoral venogram 6.  74 minutes of monitored moderate conscious sedation time  Indications:  44 year old Hispanic female who presented with extensive left leg swelling and found to have large left iliofemoral DVT.  She did undergo CT venogram that suggested May Thurner with no other evidence of etiology for her DVT.  She presents today for planned percutaneous mechanical thrombectomy with likely stent placement in the left common iliac vein after IVUS.  Risks and benefits have been discussed.  Findings:  After popliteal access, IVUS confirmed mostly acute with some underlying chronic thrombus in the left popliteal, superficial femoral vein, common femoral vein, external iliac vein and common iliac vein with a small tongue of thrombus into the IVC.  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  Percutaneous mechanical thrombectomy was performed within Innari ClotTriever with 7 passes of the left popliteal, superficial femoral, common femoral, external iliac, and left common iliac vein and IVC and retrieved large volumes of mostly acute and some chronic appearing thrombus.  The left common iliac vein stenosis was then predilated with a 12 mm Atlas, stented with a 16 mm Wallstent, and postdilated with a 14 mm Atlas with good wall apposition no residual stenosis.             Procedure:  The patient was identified in the holding area and taken to room 8.  Patient was placed prone on the table.  The left popliteal fossa was then prepped and draped in usual sterile fashion.  A timeout was performed identify patient, procedure and site.  Initially used ultrasound and evaluated the left popliteal vein which was full of acute thrombus and this was accessed with a micro access needle placed a microwire and then a micro sheath under ultrasound guidance.  I then performed a brief hand-injection to confirm I was in the popliteal vein with a venogram which I confirmed due to her size and depth of her vessels.   I then used a KMP catheter with a Glidewire advantage to cross the thrombus in the left femoral and iliac vein into the IVC and placed my wire up into the subclavian vein.  An 8 French sheath was placed in the left popliteal vein and the patient was given 8000 units of IV heparin.  I then performed IVUS of the left popliteal, superficial femoral, common femoral, external iliac, common iliac vein and IVC.  Pertinent findings are noted above.  Ultimately elected to perform percutaneous mechanical thrombectomy and we upsized to the 13 [place] Innari sheath in the left popliteal vein.  We then used the [employer] and this was subsequently placed over the glidewire advantage up into the IVC where the catch bag was deployed and we made a total of 7 passes getting large volumes of acute thrombus with some chronic thrombus as well.  We ultimately performed IVUS again after the first 5 passes and then made 2 additional passes given evidence of additional thrombus.  After 7 passes the iliofemoral segment and IVC all looked fairly clean of thrombus.  We then used our IVUS to mark the iliac vein bifurcation as well as the 87% compression on the left common iliac vein.  This lesion was then predilated with a 14 mm Atlas.  We then selected a 16 mm x 90 mm Wallstent that was deployed into the distal IVC across the iliac vein stenosis in the left common iliac vein and into the left external iliac vein.  This was postdilated with a 14 mm Atlas.  We had good wall apposition on final IVUS with no evidence of residual thrombus and appeared to have good inflow and outflow in the stent.  Satisfied with the results wires and catheters were removed.  I tied a 4-0 Monocryl pursestring around the Innari sheath that was removed from the popliteal vein and pressure was held.  Taken to holding in stable condition.  Plan: The patient should continue heparin tonight and and this should not be held and can be continued immediately.  Can be  transitioned to a DOAC tomorrow if she does okay tonight.   Cephus Shelling, MD [facility] Office: [phone]  CT VENOGRAM ABD/PEL  Result Date: 10/07/2019 CLINICAL DATA:  44 year old with known left lower DVT. CT venogram ordered to evaluate the extent of the DVT. EXAM: CT VENOGRAM ABD-PELVIS TECHNIQUE: Multidetector CT imaging of the abdomen and pelvis was performed using the standard venogram protocol during bolus administration of intravenous contrast. Multiplanar reconstructed images and MIPs were obtained and reviewed to evaluate the vascular anatomy. CONTRAST:  OMNIPAQUE IOHEXOL 350 MG/ML SOLN COMPARISON:  03/13/2018 FINDINGS: VASCULAR Arterial structures: Normal caliber of the abdominal aorta without atherosclerotic calcifications. Small amount of calcification in the right common iliac artery. Bilateral iliac arteries are patent. Portal venous system: Portal venous system is patent. IVC: Small amount of nonocclusive thrombus along the  left side of the distal IVC. This thrombus extends approximately 2.6 cm above the IVC bifurcation. Suprarenal IVC is patent. Bilateral renal veins are patent. Iliac veins: Thrombus and stranding involving the left common and left external iliac veins. Findings are suggestive for acute thrombus. At least mild compression on the left common iliac vein from the right common iliac artery. Right iliac veins appear to be patent. Evidence for thrombus in the proximal left femoral veins. Review of the MIP images confirms the above findings. NON-VASCULAR Lower chest: Lung bases are clear. Thrombus in left lower lobe segmental pulmonary arteries best seen on sequence 3, image 1. Limited evaluation for pulmonary embolism on this examination. Hepatobiliary: Low-attenuation of the liver is suggestive for hepatic steatosis. Normal appearance of the gallbladder. No biliary dilatation. Pancreas: Unremarkable. No pancreatic ductal dilatation  or surrounding inflammatory changes. Spleen: Surgically removed. Adrenals/Urinary Tract: Normal appearance of the adrenal glands. Normal appearance of both kidneys without hydronephrosis. No suspicious renal lesions. Urinary bladder is mildly distended. Stomach/Bowel: Normal appearance of the stomach. No evidence for bowel obstruction or focal bowel inflammation. Lymphatic: But cannot exclude prominent lymph nodes in the left lower iliac region but these may be reactive. Extensive edema along the left iliac chain related to the DVT also limits evaluation. Otherwise, no significant lymph node enlargement in the abdomen or pelvis. Reproductive: 3.3 cm low-density structure associated with the left ovary likely represents a cyst. Probable right ovarian follicle measuring 2.1 cm. Uterus is unremarkable. Other: Extensive edema and stranding along the left iliac vessels compatible with the acute DVT. Negative for free fluid. Negative for free air. Musculoskeletal: No acute bone abnormality. IMPRESSION: VASCULAR 1. Positive for deep venous thrombosis involving the distal IVC, left iliac veins and proximal left femoral veins. The distal IVC thrombus is nonocclusive. 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 2. Pulmonary embolism in the left lower lobe. Incomplete evaluation of the pulmonary embolism on this examination. NON-VASCULAR 1. Hepatic steatosis. 2. Probable left ovarian cyst. 3. Extensive stranding along the left side of the pelvis related to the left-sided DVT. These results were called by telephone at the time of interpretation on 10/07/2019 at 5:17 pm to Dr. Charm Barges in the emergency department, Who verbally acknowledged these results. Electronically Signed   By: Richarda Overlie M.D.   On: 10/07/2019 17:23   VAS [place] LOWER EXTREMITY VENOUS (DVT) (ONLY [facility] & [facility])  Result Date: 10/07/2019  Lower Venous DVTStudy Indications: Pain.  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  Limitations: Body habitus. Comparison Study: No prior studies. Performing Technologist: Jean Rosenthal  Examination Guidelines: A complete evaluation includes B-mode imaging, spectral Doppler, color Doppler, and power Doppler as needed of all accessible portions of each vessel. Bilateral testing is considered an integral part of a complete examination. Limited examinations for reoccurring indications may be performed as noted. The reflux portion of the exam is performed with the patient in reverse Trendelenburg.  +-----+---------------+---------+-----------+----------+--------------+ RIGHTCompressibilityPhasicitySpontaneityPropertiesThrombus Aging +-----+---------------+---------+-----------+----------+--------------+ CFV  Full           Yes      Yes                                 +-----+---------------+---------+-----------+----------+--------------+   +---------+---------------+---------+-----------+----------+-----------------+ LEFT     CompressibilityPhasicitySpontaneityPropertiesThrombus Aging    +---------+---------------+---------+-----------+----------+-----------------+ CFV      None  Age Indeterminate +---------+---------------+---------+-----------+----------+-----------------+ SFJ      None                                         Age Indeterminate +---------+---------------+---------+-----------+----------+-----------------+ FV Prox  None           No       Yes                  Age Indeterminate +---------+---------------+---------+-----------+----------+-----------------+ FV Mid   None           No       Yes                  Age Indeterminate +---------+---------------+---------+-----------+----------+-----------------+ FV DistalNone           No       Yes                  Age Indeterminate +---------+---------------+---------+-----------+----------+-----------------+ PFV      None           No       Yes                   Age Indeterminate +---------+---------------+---------+-----------+----------+-----------------+ POP      None           No       Yes                  Age Indeterminate +---------+---------------+---------+-----------+----------+-----------------+ PTV      None                                         Age Indeterminate +---------+---------------+---------+-----------+----------+-----------------+ PERO     None                                         Age Indeterminate +---------+---------------+---------+-----------+----------+-----------------+ Gastroc  None                                         Age Indeterminate +---------+---------------+---------+-----------+----------+-----------------+     Summary: RIGHT: - No evidence of common femoral vein obstruction.  LEFT: - Findings consistent with age indeterminate deep vein thrombosis involving the left common femoral vein, SF junction, left femoral vein, left proximal profunda vein, left popliteal vein, left posterior tibial veins, left peroneal veins, and left gastrocnemius veins. - No cystic structure found in the popliteal fossa. - Unable to evaluate extension of common femoral vein obstruction proximal to the inguinal ligament due to pannus/patient body habitus.  *See table(s) above for measurements and observations. Electronically signed by Lemar Livings MD on 10/07/2019 at 5:30:33 PM.    Final     Microbiology: Recent Results (from the past 240 hour(s))  SARS Coronavirus 2 by RT PCR (hospital order, performed in [facility] hospital lab) Nasopharyngeal Nasopharyngeal Swab     Status: None   Collection Time: 10/07/19  5:30 PM   Specimen: Nasopharyngeal Swab  Result Value Ref Range Status   SARS Coronavirus 2 NEGATIVE NEGATIVE Final    Comment: (NOTE) SARS-CoV-2 target nucleic acids are NOT DETECTED.  The SARS-CoV-2 RNA is  generally detectable in upper and lower respiratory specimens during the acute phase  of infection. The lowest concentration of SARS-CoV-2 viral copies this assay can detect is 250 copies / mL. A negative result does not preclude SARS-CoV-2 infection and should not be used as the sole basis for treatment or other patient management decisions.  A negative result may occur with improper specimen collection / handling, submission of specimen other than nasopharyngeal swab, presence of viral mutation(s) within the areas targeted by this assay, and inadequate number of viral copies (<250 copies / mL). A negative result must be combined with clinical observations, patient history, and epidemiological information.  Fact Sheet for Patients:   BoilerBrush.com.cyhttps://www.fda.gov/media/136312/download  Fact Sheet for Healthcare Providers: https://pope.com/https://www.fda.gov/media/136313/download  This test is not yet approved or  cleared by the [place] FDA and has been authorized for detection and/or diagnosis of SARS-CoV-2 by FDA under an Emergency Use Authorization (EUA).  This EUA will remain in effect (meaning this test can be used) for the duration of the COVID-19 declaration under Section 564(b)(1) of the Act, 21 U.S.C. section 360bbb-3(b)(1), unless the authorization is terminated or revoked sooner.  Performed at [facility], [address] [place], [place] [address]      Labs: Basic Metabolic Panel: Recent Labs  Lab 10/07/19 0650 10/07/19 0700 10/08/19 0258 10/09/19 0242 10/10/19 0303  NA 136 139 133* 137 137  K 4.0 4.1 3.9 3.6 3.5  CL 105 103 102 106 105  CO2 22  --  23 23 25   GLUCOSE 147* 146* 161* 133* 138*  BUN 15 17 12 6  <5*  CREATININE 0.68 0.50 0.66 0.50 0.62  CALCIUM 9.3  --  9.0 8.1* 8.1*   Liver Function Tests: Recent Labs  Lab 10/07/19 0650  AST 21  ALT 21  ALKPHOS 69  BILITOT 0.5  PROT 8.3*  ALBUMIN 3.3*   No results for input(s): LIPASE, AMYLASE in the last 168 hours. No results for input(s): AMMONIA in the last 168 hours. CBC: Recent Labs  Lab  10/07/19 0650 10/07/19 0700 10/08/19 0258 10/09/19 0242 10/10/19 0303  WBC 20.0*  --  23.6* 19.0* 17.1*  NEUTROABS 16.0*  --   --   --   --   HGB 12.1 13.6 11.6* 9.0* 9.1*  HCT 39.1 40.0 36.2 29.2* 28.8*  MCV 90.7  --  90.7 90.1 90.9  PLT 132*  --  130* 154 157    CBG: Recent Labs  Lab 10/07/19 1153  GLUCAP 116*       Signed:  Meredeth IdeGagan S Natividad Schlosser MD.  [facility] 10/10/2019, 12:58 PM

## 2019-10-10 NOTE — Progress Notes (Signed)
PT Cancellation Note  Patient Details Name: [name] MRN: [id number] DOB: 12-Sep-1975   Cancelled Treatment:    Reason Eval/Treat Not Completed: Other (comment). Pt just amb in hallway past nurses station with nursing. Will follow up later today or tomorrow.   Angelina Ok [facility] 10/10/2019, 11:47 AM Skip Mayer PT Acute Rehabilitation Services Pager [phone] Office [phone]

## 2019-10-10 NOTE — Progress Notes (Signed)
ANTICOAGULATION CONSULT NOTE - Follow Up Consult  Pharmacy Consult for Heparin Indication: DVT  No Known Allergies  Patient Measurements: Height: 4\' 9"  (144.8 cm) Weight: 111.6 kg (246 lb) IBW/kg (Calculated) : 38.6 Heparin Dosing Weight: 73 kg  Vital Signs: Temp: 98.8 F (37.1 C) (07/23 0747) Temp Source: Oral (07/23 0747) BP: 109/59 (07/23 0747) Pulse Rate: 84 (07/23 0747)  Labs: Recent Labs    10/08/19 0258 10/08/19 0258 10/08/19 1203 10/08/19 2210 10/09/19 0242 10/10/19 0303  HGB 11.6*   < >  --   --  9.0* 9.1*  HCT 36.2  --   --   --  29.2* 28.8*  PLT 130*  --   --   --  154 157  LABPROT  --   --   --   --   --  13.9  INR  --   --   --   --   --  1.1  HEPARINUNFRC 0.27*  --    < > 0.50 0.37 0.49  CREATININE 0.66  --   --   --  0.50 0.62   < > = values in this interval not displayed.    Estimated Creatinine Clearance: 96.1 mL/min (by C-G formula based on SCr of 0.62 mg/dL).   Medications:  Infusions:  . sodium chloride 100 mL/hr at 10/09/19 2252  . sodium chloride    . heparin 1,450 Units/hr (10/10/19 0334)    Assessment: 44 year of age patient diagnosed with acute DVT and started on IV Heparin therapy.  Now s/p thrombectomy and stent  Heparin level therapeutic this AM, CBC stable  Warfarin started 7/22, Day # 2 / 5 of overlap INR 1.1  Goal of Therapy:  Heparin level 0.3-0.7 units/ml Monitor platelets by anticoagulation protocol: Yes  INR 2 to 3   Plan:  Continue Heparin at 1450 units / hr Repeat Warfarin 10 mg po x 1 tonight Daily heparin level, CBC, INR .  Thank you  8/22, PharmD [phone]  10/10/2019 8:29 AM

## 2019-10-10 NOTE — TOC Transition Note (Signed)
Transition of Care (TOC) - CM/SW Discharge Note Donn Pierini RN, BSN Transitions of Care Unit 4E- RN Case Manager See Treatment Team for direct phone #    Patient Details  Name: [name] MRN: [id number] Date of Birth: [birth year]  Transition of Care [facility]) CM/SW Contact:  Darrold Span, RN Phone Number: 10/10/2019, 1:50 PM   Clinical Narrative:    Pt admitted with DVT, from home with family, has PCP- un-insured, will need assistance with medications at discharge- per MD plan to transition to coumadin with Lovenox bridge- will provide pt with MATCH assistance and have TOC pharmacy fill medications prior to discharge.   Final next level of care: Home/Self Care Barriers to Discharge: No Barriers Identified   Patient Goals and CMS Choice     Choice offered to / list presented to : NA  Discharge Placement                home        Discharge Plan and Services   Discharge Planning Services: CM Consult, Medication Assistance, MATCH Program            DME Arranged: N/A DME Agency: NA       HH Arranged: NA HH Agency: NA        Social Determinants of Health (SDOH) Interventions     Readmission Risk Interventions Readmission Risk Prevention Plan 10/10/2019  Transportation Screening Complete  PCP or Specialist Appt within 3-5 Days Complete  HRI or Home Care Consult Complete  Social Work Consult for Recovery Care Planning/Counseling Complete  Palliative Care Screening Not Applicable  Medication Review Oceanographer) Complete  Some recent data might be hidden

## 2019-10-10 NOTE — Progress Notes (Signed)
[facility] [employer]  Subjective  -states her left leg feels much better.   Objective (!) 109/59 84 98.8 F (37.1 C) (Oral) 18 98%  Intake/Output Summary (Last 24 hours) at 10/10/2019 0849 Last data filed at 10/10/2019 0334 Gross per 24 hour  Intake 2652.95 ml  Output 1500 ml  Net 1152.95 ml    Left lower extremity edema improving Left popliteal access site clean dry and intact  Laboratory Lab Results: Recent Labs    10/09/19 0242 10/10/19 0303  WBC 19.0* 17.1*  HGB 9.0* 9.1*  HCT 29.2* 28.8*  PLT 154 157   BMET Recent Labs    10/09/19 0242 10/10/19 0303  NA 137 137  K 3.6 3.5  CL 106 105  CO2 23 25  GLUCOSE 133* 138*  BUN 6 <5*  CREATININE 0.50 0.62  CALCIUM 8.1* 8.1*    COAG Lab Results  Component Value Date   INR 1.1 10/10/2019   INR 1.1 10/07/2019   INR 1.1 04/08/2019   No results found for: PTT  Assessment/Planning:  Postop day 2 status post left iliofemoral percutaneous mechanical thrombectomy with IVUS and left common iliac vein stent for May Thurner.  I saw notes from heme-onc that said at least 3 months of anticoagulation with Coumadin but I would generally recommend at least 6 months but Coumadin should be fine from my standpoint or what ever they prefer.  Will arrange follow-up in 1 month for venous duplex to evaluate iliac vein stent.  Cephus Shelling 10/10/2019 8:49 AM --

## 2019-10-13 ENCOUNTER — Emergency Department (HOSPITAL_COMMUNITY): Payer: Medicaid Other

## 2019-10-13 ENCOUNTER — Other Ambulatory Visit: Payer: Self-pay

## 2019-10-13 ENCOUNTER — Encounter (HOSPITAL_COMMUNITY): Payer: Self-pay | Admitting: Emergency Medicine

## 2019-10-13 ENCOUNTER — Inpatient Hospital Stay (HOSPITAL_COMMUNITY)
Admission: EM | Admit: 2019-10-13 | Discharge: 2019-10-20 | DRG: 813 | Disposition: A | Discharge status: Home / Self Care | Payer: Medicaid Other | Attending: Internal Medicine | Admitting: Internal Medicine

## 2019-10-13 DIAGNOSIS — Z7901 Long term (current) use of anticoagulants: Secondary | ICD-10-CM | POA: Diagnosis not present

## 2019-10-13 DIAGNOSIS — D696 Thrombocytopenia, unspecified: Secondary | ICD-10-CM | POA: Diagnosis present

## 2019-10-13 DIAGNOSIS — R319 Hematuria, unspecified: Secondary | ICD-10-CM | POA: Diagnosis present

## 2019-10-13 DIAGNOSIS — Z79899 Other long term (current) drug therapy: Secondary | ICD-10-CM

## 2019-10-13 DIAGNOSIS — Z20822 Contact with and (suspected) exposure to covid-19: Secondary | ICD-10-CM | POA: Diagnosis present

## 2019-10-13 DIAGNOSIS — I2699 Other pulmonary embolism without acute cor pulmonale: Secondary | ICD-10-CM | POA: Diagnosis present

## 2019-10-13 DIAGNOSIS — D693 Immune thrombocytopenic purpura: Secondary | ICD-10-CM | POA: Diagnosis present

## 2019-10-13 DIAGNOSIS — Z7982 Long term (current) use of aspirin: Secondary | ICD-10-CM | POA: Diagnosis not present

## 2019-10-13 DIAGNOSIS — M7989 Other specified soft tissue disorders: Secondary | ICD-10-CM | POA: Diagnosis present

## 2019-10-13 DIAGNOSIS — Z6841 Body Mass Index (BMI) 40.0 and over, adult: Secondary | ICD-10-CM | POA: Diagnosis not present

## 2019-10-13 DIAGNOSIS — Z86711 Personal history of pulmonary embolism: Secondary | ICD-10-CM

## 2019-10-13 DIAGNOSIS — G629 Polyneuropathy, unspecified: Secondary | ICD-10-CM | POA: Diagnosis present

## 2019-10-13 DIAGNOSIS — K219 Gastro-esophageal reflux disease without esophagitis: Secondary | ICD-10-CM | POA: Diagnosis present

## 2019-10-13 DIAGNOSIS — F32A Depression, unspecified: Secondary | ICD-10-CM

## 2019-10-13 DIAGNOSIS — I82402 Acute embolism and thrombosis of unspecified deep veins of left lower extremity: Secondary | ICD-10-CM

## 2019-10-13 DIAGNOSIS — I2602 Saddle embolus of pulmonary artery with acute cor pulmonale: Secondary | ICD-10-CM

## 2019-10-13 DIAGNOSIS — F329 Major depressive disorder, single episode, unspecified: Secondary | ICD-10-CM | POA: Diagnosis present

## 2019-10-13 DIAGNOSIS — N939 Abnormal uterine and vaginal bleeding, unspecified: Secondary | ICD-10-CM | POA: Diagnosis present

## 2019-10-13 LAB — CBC WITH DIFFERENTIAL/PLATELET
Abs Immature Granulocytes: 0.31 10*3/uL — ABNORMAL HIGH (ref 0.00–0.07)
Basophils Absolute: 0.1 10*3/uL (ref 0.0–0.1)
Basophils Relative: 1 %
Eosinophils Absolute: 0.5 10*3/uL (ref 0.0–0.5)
Eosinophils Relative: 4 %
HCT: 35.2 % — ABNORMAL LOW (ref 36.0–46.0)
Hemoglobin: 11.4 g/dL — ABNORMAL LOW (ref 12.0–15.0)
Immature Granulocytes: 2 %
Lymphocytes Relative: 20 %
Lymphs Abs: 2.7 10*3/uL (ref 0.7–4.0)
MCH: 28.9 pg (ref 26.0–34.0)
MCHC: 32.4 g/dL (ref 30.0–36.0)
MCV: 89.3 fL (ref 80.0–100.0)
Monocytes Absolute: 0.8 10*3/uL (ref 0.1–1.0)
Monocytes Relative: 6 %
Neutro Abs: 8.9 10*3/uL — ABNORMAL HIGH (ref 1.7–7.7)
Neutrophils Relative %: 67 %
Platelets: <5 10*3/uL — CL (ref 150–400)
RBC: 3.94 MIL/uL (ref 3.87–5.11)
RDW: 14.6 % (ref 11.5–15.5)
WBC: 13.2 10*3/uL — ABNORMAL HIGH (ref 4.0–10.5)

## 2019-10-13 LAB — URINALYSIS, ROUTINE W REFLEX MICROSCOPIC
Bilirubin Urine: NEGATIVE
Glucose, UA: NEGATIVE mg/dL
Ketones, ur: 5 mg/dL — AB
Nitrite: NEGATIVE
Protein, ur: NEGATIVE mg/dL
Specific Gravity, Urine: 1.045 — ABNORMAL HIGH (ref 1.005–1.030)
pH: 7 (ref 5.0–8.0)

## 2019-10-13 LAB — ECHOCARDIOGRAM COMPLETE
Area-P 1/2: 3.99 cm2
Height: 60 in
S' Lateral: 2.6 cm
Weight: 3936 oz

## 2019-10-13 LAB — CBC
HCT: 34.7 % — ABNORMAL LOW (ref 36.0–46.0)
Hemoglobin: 10.8 g/dL — ABNORMAL LOW (ref 12.0–15.0)
MCH: 28.1 pg (ref 26.0–34.0)
MCHC: 31.1 g/dL (ref 30.0–36.0)
MCV: 90.1 fL (ref 80.0–100.0)
Platelets: 29 10*3/uL — CL (ref 150–400)
RBC: 3.85 MIL/uL — ABNORMAL LOW (ref 3.87–5.11)
RDW: 15 % (ref 11.5–15.5)
WBC: 18.2 10*3/uL — ABNORMAL HIGH (ref 4.0–10.5)
nRBC: 7.1 % — ABNORMAL HIGH (ref 0.0–0.2)

## 2019-10-13 LAB — COMPREHENSIVE METABOLIC PANEL
ALT: 36 U/L (ref 0–44)
AST: 41 U/L (ref 15–41)
Albumin: 3.3 g/dL — ABNORMAL LOW (ref 3.5–5.0)
Alkaline Phosphatase: 55 U/L (ref 38–126)
Anion gap: 9 (ref 5–15)
BUN: 10 mg/dL (ref 6–20)
CO2: 24 mmol/L (ref 22–32)
Calcium: 9 mg/dL (ref 8.9–10.3)
Chloride: 105 mmol/L (ref 98–111)
Creatinine, Ser: 0.65 mg/dL (ref 0.44–1.00)
GFR calc Af Amer: >60 mL/min (ref >60)
GFR calc non Af Amer: >60 mL/min (ref >60)
Glucose, Bld: 140 mg/dL — ABNORMAL HIGH (ref 70–99)
Potassium: 3.4 mmol/L — ABNORMAL LOW (ref 3.5–5.1)
Sodium: 138 mmol/L (ref 135–145)
Total Bilirubin: 0.3 mg/dL (ref 0.3–1.2)
Total Protein: 8 g/dL (ref 6.5–8.1)

## 2019-10-13 LAB — TYPE AND SCREEN
ABO/RH(D): O POS
Antibody Screen: NEGATIVE

## 2019-10-13 LAB — PROTIME-INR
INR: 1.5 — ABNORMAL HIGH (ref 0.8–1.2)
Prothrombin Time: 17.8 seconds — ABNORMAL HIGH (ref 11.4–15.2)

## 2019-10-13 LAB — I-STAT BETA HCG BLOOD, ED (MC, WL, AP ONLY): I-stat hCG, quantitative: <5 m[IU]/mL (ref <5)

## 2019-10-13 LAB — TROPONIN I (HIGH SENSITIVITY)
Troponin I (High Sensitivity): 5 ng/L (ref <18)
Troponin I (High Sensitivity): 3 ng/L (ref <18)

## 2019-10-13 LAB — SARS CORONAVIRUS 2 BY RT PCR (HOSPITAL ORDER, PERFORMED IN ~~LOC~~ HOSPITAL LAB): SARS Coronavirus 2: NEGATIVE

## 2019-10-13 MED ORDER — IOHEXOL 350 MG/ML SOLN
100.0000 mL | Freq: Once | INTRAVENOUS | Status: AC | PRN
  Administered 2019-10-13: 100 mL via INTRAVENOUS

## 2019-10-13 MED ORDER — SODIUM CHLORIDE 0.9% IV SOLUTION: Freq: Once | INTRAVENOUS | Status: AC

## 2019-10-13 MED ORDER — POLYETHYLENE GLYCOL 3350 17 G PO PACK: 17.0000 g | PACK | Freq: Every day | ORAL | Status: DC | PRN

## 2019-10-13 MED ORDER — IMMUNE GLOBULIN (HUMAN) 10 GM/100ML IV SOLN
1.0000 g/kg | INTRAVENOUS | Status: AC
  Administered 2019-10-13 – 2019-10-14 (×2): 110 g via INTRAVENOUS
  Filled 2019-10-13: qty 1000
  Filled 2019-10-13: qty 1100

## 2019-10-13 MED ORDER — DEXAMETHASONE SODIUM PHOSPHATE 10 MG/ML IJ SOLN
40.0000 mg | Freq: Once | INTRAMUSCULAR | Status: AC
  Administered 2019-10-13: 40 mg via INTRAVENOUS
  Filled 2019-10-13: qty 4

## 2019-10-13 MED ORDER — SODIUM CHLORIDE 0.9 % IV BOLUS
1000.0000 mL | Freq: Once | INTRAVENOUS | Status: AC
  Administered 2019-10-13: 1000 mL via INTRAVENOUS

## 2019-10-13 MED ORDER — DOCUSATE SODIUM 100 MG PO CAPS: 100.0000 mg | ORAL_CAPSULE | Freq: Two times a day (BID) | ORAL | Status: DC | PRN

## 2019-10-13 MED ORDER — HEPARIN (PORCINE) 25000 UT/250ML-% IV SOLN
450.0000 [IU]/h | INTRAVENOUS | Status: DC
  Administered 2019-10-13: 1250 [IU]/h via INTRAVENOUS
  Administered 2019-10-14: 1100 [IU]/h via INTRAVENOUS
  Administered 2019-10-16 – 2019-10-18 (×2): 450 [IU]/h via INTRAVENOUS
  Filled 2019-10-13 (×4): qty 250

## 2019-10-13 MED ORDER — ROMIPLOSTIM 250 MCG ~~LOC~~ SOLR
2.0000 ug/kg | Freq: Once | SUBCUTANEOUS | Status: AC
  Administered 2019-10-13: 225 ug via SUBCUTANEOUS
  Filled 2019-10-13: qty 0.45

## 2019-10-13 MED ORDER — PANTOPRAZOLE SODIUM 40 MG PO TBEC
40.0000 mg | DELAYED_RELEASE_TABLET | Freq: Every day | ORAL | Status: DC
  Administered 2019-10-14 – 2019-10-20 (×7): 40 mg via ORAL
  Filled 2019-10-13 (×7): qty 1

## 2019-10-13 MED ORDER — METHYLPREDNISOLONE SODIUM SUCC 125 MG IJ SOLR: 125.0000 mg | Freq: Once | INTRAMUSCULAR | Status: DC

## 2019-10-13 MED ORDER — POTASSIUM CHLORIDE CRYS ER 20 MEQ PO TBCR
40.0000 meq | EXTENDED_RELEASE_TABLET | Freq: Once | ORAL | Status: AC
  Administered 2019-10-13: 40 meq via ORAL
  Filled 2019-10-13: qty 2

## 2019-10-13 MED ORDER — IOHEXOL 350 MG/ML SOLN: 100.0000 mL | Freq: Once | INTRAVENOUS | Status: DC | PRN

## 2019-10-13 MED ORDER — SODIUM CHLORIDE 0.9% FLUSH: 3.0000 mL | Freq: Once | INTRAVENOUS | Status: DC

## 2019-10-13 MED ORDER — CHLORHEXIDINE GLUCONATE CLOTH 2 % EX PADS
6.0000 | MEDICATED_PAD | Freq: Every day | CUTANEOUS | Status: DC
  Administered 2019-10-14 – 2019-10-20 (×4): 6 via TOPICAL

## 2019-10-13 NOTE — Progress Notes (Signed)
ANTICOAGULATION CONSULT NOTE - Initial Consult  Pharmacy Consult for Heparin Indication: pulmonary embolus  No Known Allergies  Patient Measurements: Height: 5' (152.4 cm) Weight: (!) 111.6 kg (246 lb) IBW/kg (Calculated) : 45.5 Heparin Dosing Weight: 73.3kg  Vital Signs: Temp: 98.7 F (37.1 C) (07/26 1755) Temp Source: Oral (07/26 1755) BP: 122/78 (07/26 1755) Pulse Rate: 82 (07/26 1755)  Labs: Recent Labs    10/13/19 0449 10/13/19 0854 10/13/19 1208 10/13/19 1629  HGB 11.4*  --   --  10.8*  HCT 35.2*  --   --  34.7*  PLT <5*  --   --  29*  LABPROT 17.8*  --   --   --   INR 1.5*  --   --   --   CREATININE 0.65  --   --   --   TROPONINIHS  --  5 3  --     Estimated Creatinine Clearance: 101.9 mL/min (by C-G formula based on SCr of 0.65 mg/dL).   Medical History: Past Medical History:  Diagnosis Date  . Anemia   . Chronic ITP (idiopathic thrombocytopenia) (HCC)   . Depression   . Headache   . 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 [START ON 10/14/2019] pantoprazole  40 mg Oral Daily  . potassium chloride  40 mEq Oral Once  . sodium chloride flush  3 mL Intravenous Once    Assessment: Patient is a 44yo female who presents with at least submassive PE. 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 Per heme wait to start heparin until plt>30 w/out bolus. HCT 35.2, Hg 11.4. Takes warfarin 5mg  daily for DVT w/ INR 1.5. Patient had left iliac vein stenting on 7/21. Reports her gums are bleeding today. With plt 29 & subtherapeutic INR as well as new PE, will start heparin with no bolus.  Goal of Therapy:  Heparin level 0.3-0.5 units/ml Monitor platelets by anticoagulation protocol: Yes   Plan:  No bolus d/t low plt count/per heme Start heparin at 1250 units/hr Monitor 6h heparin level- use lower goal d/t low plt count Monitor daily CBC, HL, and s/sx of  bleeding  8/21, PharmD Candidate 10/13/2019,7:06 PM

## 2019-10-13 NOTE — Progress Notes (Signed)
  Echocardiogram 2D Echocardiogram has been performed.  [name] 10/13/2019, 2:17 PM

## 2019-10-13 NOTE — Consult Note (Addendum)
Hospital Consult    Reason for Consult: Consideration for IVC filter placement Requesting Physician: ED MRN #:  [id number]  History of Present Illness: This is a 44 y.o. female with past medical history significant for chronic ITP who was recently admitted to the hospital with left lower extremity DVT.  She underwent left lower extremity mechanical thrombectomy and left iliac vein stenting by Dr. Chestine Spore on 10/08/2019.  Work-up in emergency department today demonstrates bilateral pulmonary emboli.  However, she does not subjectively really complain of shortness of breath currently.  She does complain of bleeding from her gums.  Labs also demonstrate a platelet count of 5000.  She is receiving IVIG.  She also is receiving platelet transfusion.  She denies any change in edema of left lower extremity.  Coumadin and Lovenox has been discontinued.  An interpreter was used during today's visit.  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    Past Surgical History:  Procedure Laterality Date   BREAST SURGERY  biopsy   LOWER EXTREMITY VENOGRAPHY Left 10/08/2019   Procedure: LOWER EXTREMITY VENOGRAPHY;  Surgeon: Cephus Shelling, MD;  Location: [facility] INVASIVE CV [facility];  Service: Cardiovascular;  Laterality: Left;   PERIPHERAL VASCULAR INTERVENTION Left 10/08/2019   Procedure: PERIPHERAL VASCULAR INTERVENTION;  Surgeon: Cephus Shelling, MD;  Location: [facility] INVASIVE CV LAB;  Service: Cardiovascular;  Laterality: Left;  LT COMMON ILIAC VEIN   PERIPHERAL VASCULAR THROMBECTOMY N/A 10/08/2019   Procedure: PERIPHERAL VASCULAR THROMBECTOMY;  Surgeon: Cephus Shelling, MD;  Location: [facility] INVASIVE CV LAB;  Service: Cardiovascular;  Laterality: N/A;   SPLENECTOMY, TOTAL N/A 09/16/2015   Procedure: OPEN SPLENECTOMY;  Surgeon: Chevis Pretty III, MD;  Location: [facility] OR;  Service: General;  Laterality: N/A;    No Known Allergies  Prior to Admission medications   Medication Sig Start Date End Date Taking? Authorizing Provider  aspirin EC 81 MG EC tablet Take 1 tablet (81 mg total) by mouth daily. Swallow whole. 10/11/19  Yes Meredeth Ide, MD  enoxaparin (LOVENOX) 150 MG/ML injection Inject 1 mL (150 mg total) into the skin daily for 5 days. 10/11/19 10/16/19 Yes Meredeth Ide, MD  warfarin (COUMADIN) 5 MG tablet Take 1 tablet (5 mg total) by mouth daily. 10/11/19 01/09/20 Yes Meredeth Ide, MD  HYDROcodone-acetaminophen (NORCO/VICODIN) 5-325 MG tablet Take 1 tablet by mouth every 4 (four) hours as needed for severe pain. Patient not taking: Reported on 10/13/2019 09/19/19 09/18/20  Elson Areas, PA-C  meclizine (ANTIVERT) 25 MG tablet Take 1 tablet (25 mg total) by mouth 3 (three) times daily as needed for dizziness. Patient not taking: Reported on 10/13/2019 08/11/19   Malachy Mood, MD  pantoprazole (PROTONIX) 40 MG tablet Take 1 tablet (40 mg total) by mouth daily. Patient not taking: Reported on 10/13/2019 08/06/19   Myrtis Ser, NP    Social History   Socioeconomic History   Marital status: Married    Spouse name: Not on file   Number of children: Not on file   Years of education: Not on file   Highest education level: Not on file  Occupational History   Occupation: [employer]  Tobacco Use   Smoking status: Never Smoker   Smokeless tobacco: Never Used  Vaping Use   Vaping Use:  Never used  Substance and Sexual Activity   Alcohol use: No    Alcohol/week: 0.0 standard drinks   Drug use: No   Sexual activity: Not on file  Other Topics Concern   Not on file  Social History Narrative   Not on file   Social Determinants of Health   Financial Resource Strain:    Difficulty of Paying Living Expenses:   Food Insecurity:    Worried About Programme researcher, broadcasting/film/video in the Last Year:    Engineer, site in the Last Year:   Transportation Needs:    Freight forwarder (Medical):    Lack of Transportation (Non-Medical):   Physical Activity:    Days of Exercise per Week:    Minutes of Exercise per Session:   Stress:    Feeling of Stress :   Social Connections:    Frequency of Communication with Friends and Family:    Frequency of Social Gatherings with Friends and Family:    Attends Religious Services:    Active Member of Clubs or Organizations:    Attends Banker Meetings:    Marital Status:   Intimate Partner Violence:    Fear of Current or Ex-Partner:    Emotionally Abused:    Physically Abused:    Sexually Abused:      Family History  Problem Relation Age of Onset   Diabetes Mother    Breast cancer Mother     ROS: Otherwise negative unless mentioned in HPI  Physical Examination  Vitals:   10/13/19 1629 10/13/19 1646  BP: (!) 127/96 117/76  Pulse: 93 87  Resp: 20 20  Temp: 98.7 F (37.1 C) 98.7 F (37.1 C)  SpO2: 100% 100%   Body mass index is 48.04 kg/m.  General:  WDWN in NAD Gait: Not observed HENT: WNL, normocephalic Pulmonary: normal non-labored breathing, without Rales, rhonchi,  wheezing Cardiac: regular Abdomen:  soft, NT/ND, no masses Skin: without rashes Vascular Exam/Pulses: No significant edema right compared to left lower extremity  Musculoskeletal: no muscle wasting or atrophy  Neurologic: A&O X 3;  No focal weakness or paresthesias are detected; speech is fluent/normal Psychiatric:  The pt has Normal affect. Lymph:  Unremarkable  CBC    Component Value Date/Time   WBC 13.2 (H) 10/13/2019 0449   RBC 3.94 10/13/2019 0449   HGB 11.4 (L) 10/13/2019 0449   HGB 12.8 10/01/2019 0838   HCT 35.2 (L) 10/13/2019 0449   HCT 27.3 (L) 09/21/2015 0900   PLT <5 (LL) 10/13/2019 0449   PLT 168 10/01/2019 0838   MCV 89.3 10/13/2019 0449   MCH 28.9 10/13/2019 0449   MCHC 32.4 10/13/2019 0449   RDW 14.6  10/13/2019 0449   LYMPHSABS 2.7 10/13/2019 0449   MONOABS 0.8 10/13/2019 0449   EOSABS 0.5 10/13/2019 0449   BASOSABS 0.1 10/13/2019 0449    BMET    Component Value Date/Time   NA 138 10/13/2019 0449   K 3.4 (L) 10/13/2019 0449   CL 105 10/13/2019 0449   CO2 24 10/13/2019 0449   GLUCOSE 140 (H) 10/13/2019 0449   BUN 10 10/13/2019 0449   CREATININE 0.65 10/13/2019 0449   CREATININE 0.84 08/11/2019 1329   CALCIUM 9.0 10/13/2019 0449   GFRNONAA >60 10/13/2019 0449   GFRNONAA >60 08/11/2019 1329   GFRAA >60 10/13/2019 0449   GFRAA >60 08/11/2019 1329    COAGS: Lab Results  Component Value Date   INR 1.5 (H) 10/13/2019  INR 1.1 10/10/2019   INR 1.1 10/07/2019     Non-Invasive Vascular Imaging:   Bilateral PE   ASSESSMENT/PLAN: This is a 44 y.o. female with history of chronic ITP and status post mechanical thrombectomy and left iliac vein stent placement by Dr. Chestine Spore on 10/08/2019 presented to the emergency department due to bleeding from gums and also found to have bilateral PE.  Patient also with platelet count of 5000.  Patient being transfused 2 units of platelets during physical exam Plan will be to reassess platelet count after transfusion has been completed We can consider IVC filter placement if platelet count significantly improves While anticoagulation is being held patient is at high risk for thrombosis of left iliac vein stent On-call vascular surgeon Dr. Darrick Penna will evaluate the patient and provide further treatment plans  Emilie Rutter PA-C [facility] [phone]  History and exam details as above.   CT images reviewed which shows the left common iliac stent in good position although difficult to determine patency.  CT scan of the chest shows multiple pulmonary emboli.  Although patient has pulmonary emboli by chest CT clinically she does not currently experience any shortness of breath.  She primarily is complaining of some numbness  on the lateral aspect of her right thigh and her gums bleeding.  She has no significant swelling in her left leg suggesting that her left iliac stent is patent.  This will be a difficult balance between her anticoagulation and her low platelet count.  Consideration for an IVC filter however without anticoagulation she is going to be high risk for occlusion of her left iliac venous stent and potentially occlusion of the IVC filter.  Hopefully she will tolerate heparin after her platelets are up.  We would need her platelet count to be at least 50,000 for consideration of IVC filter placement.  We will follow as a consult.  Dr. Chestine Spore will see the patient tomorrow morning for further evaluation.  Fabienne Bruns, MD [facility] [place] Office: [phone]

## 2019-10-13 NOTE — Progress Notes (Signed)
Hematology note   I noticed that pt returned to ED this morning with plt<5k and gum bleeding. We will f/u her today, probably will be sometime in afternoon.   Please consider stopping anticoagulation, and start her on IVIG 1g/mg once today, dexa 40mg  daily for 4 days, and Nplate 3mcg/kg injection once today. We may need to consider IVC filter if she has significant residual DVT in leg.   I can be reached at my cell [phone].   I will try to call her daughter.   [phone]  10/13/2019

## 2019-10-13 NOTE — ED Triage Notes (Signed)
Pt reports had having vein surgery on her left leg last week.  There is swelling and some discharge coming from the wound.  She also reports her gums are bleeding.

## 2019-10-13 NOTE — H&P (Addendum)
NAME:  [name], MRN:  [id number], DOB:  1975/05/13, LOS: 0 ADMISSION DATE:  10/13/2019, CONSULTATION DATE:  10/13/2019 REFERRING MD:  Alvira Monday. MD, CHIEF COMPLAINT:  Bleeding gums  Brief History   Consulted for evaluation of multiple pulmonary emboli in left main pulmonary artery, both lower pulmonary arteries, right middle lobe pulmonary artery and right upper lobe pulmonary artery.   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 Platelet count was 157K on 10/10/2019. She was found to be Thrombocytopenic to 5< K/undetectable on admission.    Nplate held since her last admission. Patient reports compliance on Coumadin, Lovenox, and Asprin 81mg  since discharge. Follow up was schedule with hematology for further adjustment to Coumadin and Lovenox.   Denies recent illness, fever, sore throat, shortness of breath, cough, chest pain, hematuria, hematochezia, or difficulty ambulating.  Past Medical History   has a past medical history of Anemia, Chronic ITP (idiopathic thrombocytopenia) (HCC), Depression, Headache, and Retinal hemorrhage of both eyes (10/05/2015).  Significant Hospital Events   Admit 7/26   Consults:  PCCM Vascular Surgery  Procedures:    Significant Diagnostic Tests:  CT Head WO Contrast 7/26: No CT evidence of acute intracranial abnormality. Empty Sella Turcica.  CT Angio Chest PE w/wo Contrast 7/26 : Positive for acute PE with CT evidence of right heart strain (RV/LV Ratio = 1.0) consistent with at least submassive (intermediate risk) PE. The presence of right heart strain has beenassociated with an increased risk of morbidity and mortality.Pulmonary emboli are seen throughout multiple lower lobe pulmonaryartery  branches as well as in right middle lobe and right upper lobebranches. Pulmonary embolus arises at the distal most aspect of the left main pulmonary artery. CT abdomen pelvis w contrast 7/26 : No acute findings in the abdomen or pelvis. Interval left common/external iliac vein stent placement. No evidence for filling defect in the inferior IVC and stent device appears patent. 3. Similar appearance of the edema/stranding in the left pelvic sidewall around the iliac vessels compared to the pre procedure CT venogram. No evidence for new or progressive extraperitoneal hemorrhage.  Micro Data:  SARS Coronavirus: Negative   Antimicrobials:  n/a  Interim history/subjective:  n/a  Objective   Blood pressure (!) 128/39, pulse 73, temperature 98.2 F (36.8 C), temperature source Oral, resp. rate (!) 24, height 5' (1.524 m), weight (!) 111.6 kg, SpO2 93 %.        Intake/Output Summary (Last 24 hours) at 10/13/2019 1300 Last data filed at 10/13/2019 1248 Gross per 24 hour  Intake 1000 ml  Output --  Net 1000 ml   Filed Weights   10/13/19 0421  Weight: (!) 111.6 kg    Examination: General: Obese, NAD HENT: [place]/AT, EOMI, PERRL, no rhinorrhea,  Lungs: CTAB, no wheezes rhonchi or rales  Cardiovascular: Regular rate and rhythm, no murmurs rubs or gallops Abdomen: Large pannus , multiple ecchymosis-(Lovenox shots) , mild diffuse tenderness  Extremities: No lower extremity edema, pre tibial measurements are equal, no cyanosis , no phlegmasia cerulea dolens  Neuro: CN II-XII grossly intact, Motor 5/5 upper and lower extremities, Sensory intact and equal to light touch GU: Defer  Resolved Hospital Problem list   N/a   Assessment & Plan:  ITP May Thurner s/p left iliac vein stent  Submassive PE Dr.Malvern Kadlec discussed case with Dr.Feng and appreciate recommendations in chart. Vascular surgery consulted in ED, appreciate their recommendations.  Plan: - 2 units of platelets ordered - Dexamethasone  40 mg daily for 4 days - Started on IVIG 1g/kg once today - Nplate 2 mcg/kg onee today - Anticoagulation on hold, when platelets are > 30 K, plan to start Heparin without bolus - Trend platelets with CBC q8hrs - Q 1 hour neuro checks, risk for spontaneous bleeding , which includes intracranial bleeding , while platelets < 20 K.  - Follow up TTE to better evaluate Right heart strain  Best practice:  Diet: Regular  Pain/Anxiety/Delirium protocol (if indicated): n/a DVT prophylaxis: hold due to increased risk of bleeding GI prophylaxis: Pantoprazole 40 mg daily  Glucose control: Monitor on BMP Mobility: Up with assiatance Code Status: Full Family Communication: Daughter updated bedside, ( daughter speaks English and Spanish) Disposition: ICU  Labs   CBC: Recent Labs  Lab 10/07/19 0650 10/07/19 0650 10/07/19 0700 10/08/19 0258 10/09/19 0242 10/10/19 0303 10/13/19 0449  WBC 20.0*  --   --  23.6* 19.0* 17.1* 13.2*  NEUTROABS 16.0*  --   --   --   --   --  8.9*  HGB 12.1   < > 13.6 11.6* 9.0* 9.1* 11.4*  HCT 39.1   < > 40.0 36.2 29.2* 28.8* 35.2*  MCV 90.7  --   --  90.7 90.1 90.9 89.3  PLT 132*  --   --  130* 154 157 <5*   < > = values in this interval not displayed.    Basic Metabolic Panel: Recent Labs  Lab 10/07/19 0650 10/07/19 0650 10/07/19 0700 10/08/19 0258 10/09/19 0242 10/10/19 0303 10/13/19 0449  NA 136   < > 139 133* 137 137 138  K 4.0   < > 4.1 3.9 3.6 3.5 3.4*  CL 105   < > 103 102 106 105 105  CO2 22  --   --  23 23 25 24   GLUCOSE 147*   < > 146* 161* 133* 138* 140*  BUN 15   < > 17 12 6  <5* 10  CREATININE 0.68   < > 0.50 0.66 0.50 0.62 0.65  CALCIUM 9.3  --   --  9.0 8.1* 8.1* 9.0   < > = values in this interval not displayed.   GFR: Estimated Creatinine Clearance: 101.9 mL/min (by C-G formula based on SCr of 0.65 mg/dL). Recent Labs  Lab 10/08/19 0258 10/09/19 0242 10/10/19 0303 10/13/19 0449  WBC 23.6* 19.0* 17.1* 13.2*    Liver  Function Tests: Recent Labs  Lab 10/07/19 0650 10/13/19 0449  AST 21 41  ALT 21 36  ALKPHOS 69 55  BILITOT 0.5 0.3  PROT 8.3* 8.0  ALBUMIN 3.3* 3.3*   No results for input(s): LIPASE, AMYLASE in the last 168 hours. No results for input(s): AMMONIA in the last 168 hours.  ABG    Component Value Date/Time   PHART 7.365 09/16/2015 1137   PCO2ART 37.7 09/16/2015 1137   PO2ART 246.0 (H) 09/16/2015 1137   HCO3 21.7 09/16/2015 1137   TCO2 24 10/07/2019 0700   ACIDBASEDEF 3.0 (H) 09/16/2015 1137   O2SAT 100.0 09/16/2015 1137     Coagulation Profile: Recent Labs  Lab 10/07/19 0650 10/10/19 0303 10/13/19 0449  INR 1.1 1.1 1.5*    Cardiac Enzymes: No results for input(s): CKTOTAL, CKMB, CKMBINDEX, TROPONINI in the last 168 hours.  HbA1C: Hgb A1c MFr Bld  Date/Time Value Ref Range Status  10/08/2019 02:58 AM 7.2 (H) 4.8 - 5.6 % Final    Comment:    (NOTE) Pre diabetes:          5.7%-6.4%  Diabetes:              >6.4%  Glycemic control for   <7.0% adults with diabetes   10/14/2018 11:30 AM 6.0 (H) 4.8 - 5.6 % Final    Comment:    (NOTE) Pre diabetes:          5.7%-6.4% Diabetes:              >6.4% Glycemic control for   <7.0% adults with diabetes     CBG: Recent Labs  Lab 10/07/19 1153  GLUCAP 116*    Review of Systems:   Complete review of systems negative if not mentioned in HPI. Past Medical History  She,  has a past medical history of Anemia, Chronic ITP (idiopathic thrombocytopenia) (HCC), Depression, Headache, and Retinal hemorrhage of both eyes (10/05/2015).   Surgical History    Past Surgical History:  Procedure Laterality Date  . BREAST SURGERY  biopsy  . LOWER EXTREMITY VENOGRAPHY Left 10/08/2019   Procedure: LOWER EXTREMITY VENOGRAPHY;  Surgeon: Cephus Shelling, MD;  Location: [facility] INVASIVE CV LAB;  Service: Cardiovascular;  Laterality: Left;  . PERIPHERAL VASCULAR INTERVENTION Left 10/08/2019   Procedure: PERIPHERAL VASCULAR  INTERVENTION;  Surgeon: Cephus Shelling, MD;  Location: [facility] INVASIVE CV LAB;  Service: Cardiovascular;  Laterality: Left;  LT COMMON ILIAC VEIN  . PERIPHERAL VASCULAR THROMBECTOMY N/A 10/08/2019   Procedure: PERIPHERAL VASCULAR THROMBECTOMY;  Surgeon: Cephus Shelling, MD;  Location: [facility] INVASIVE CV LAB;  Service: Cardiovascular;  Laterality: N/A;  . SPLENECTOMY, TOTAL N/A 09/16/2015   Procedure: OPEN SPLENECTOMY;  Surgeon: Chevis Pretty III, MD;  Location: [facility] OR;  Service: General;  Laterality: N/A;     Social History   reports that she has never smoked. She has never used smokeless tobacco. She reports that she does not drink alcohol and does not use drugs.   Family History   Her family history includes Breast cancer in her mother; Diabetes in her mother.   Allergies No Known Allergies   Home Medications  Prior to Admission medications   Medication Sig Start Date End Date Taking? Authorizing Provider  aspirin EC 81 MG EC tablet Take 1 tablet (81 mg total) by mouth daily. Swallow whole. 10/11/19  Yes Meredeth Ide, MD  enoxaparin (LOVENOX) 150 MG/ML injection Inject 1 mL (150 mg total) into the skin daily for 5 days. 10/11/19 10/16/19 Yes Meredeth Ide, MD  warfarin (COUMADIN) 5 MG tablet Take 1 tablet (5 mg total) by mouth daily. 10/11/19 01/09/20 Yes Meredeth Ide, MD  HYDROcodone-acetaminophen (NORCO/VICODIN) 5-325 MG tablet Take 1 tablet by mouth every 4 (four) hours as needed for severe pain. Patient not taking: Reported on 10/13/2019 09/19/19 09/18/20  Elson Areas, PA-C  meclizine (ANTIVERT) 25 MG tablet Take 1 tablet (25 mg total) by mouth 3 (three) times daily as needed for dizziness. Patient not taking: Reported on 10/13/2019 08/11/19   Malachy Mood, MD  pantoprazole (PROTONIX) 40 MG tablet Take 1 tablet (40 mg total) by mouth daily. Patient not taking: Reported on 10/13/2019 08/06/19   Myrtis Ser, NP     Critical care time:     Thurmon Fair, MD PGY2     PCCM:  44 yo FM  ITP, no admitted with new PE  h/o of may thurner syndrome, s/p left iliac stent. Was compliant with coumadin, lovenox and asa.   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

## 2019-10-13 NOTE — Progress Notes (Addendum)
[name]   DOB:Jan 28, 1976   DX#:[id number]   ZJQ#:[id number]  Hematology follow up   Subjective: The patient returned to the emergency room this morning due to bleeding gums.  Platelet count on arrival was less than 5000.  She was just discharged from the hospital on 10/10/2019.  On the day of discharge her platelet count was 157,000.  She had a CT of the head without contrast performed secondary to headache which did not show any evidence of intracranial bleed.  CTA chest positive for acute PE with CT evidence of right heart strain consistent with at least submassive PE.  Pulmonary emboli were seen throughout the multiple lower lobe pulmonary artery branches as well as the right middle lobe and right upper lobe branches.  Pulmonary embolus arises at the distalmost aspect of the left main pulmonary artery.  CT abdomen and pelvis showed the interval left common/external iliac vein stent placement with no evidence for filling defect in the inferior IVC and stent device appears patent, there is similar appearance of the edema/stranding of the left pelvic sidewall around the iliac vessels compared to the prior CT scan.  At the time my visit, the patient continues to have oozing from her gums.  She is biting down on gauze.  She denies having chest pain, shortness of breath, cough.  Left leg pain and edema improved.  She does not notice any other bleeding other than the bleeding gums today.  IVIG currently infusing.  Received dexamethasone 40 mg IV earlier today.  She also received a dose of Nplate 2 mcg/kg.  Platelets have been ordered and are due to be hung shortly.  Objective:  Vitals:   10/13/19 1355 10/13/19 1515  BP: 121/75 (!) 131/82  Pulse: 77 80  Resp: 20 (!) 24  Temp:  98.7 F (37.1 C)  SpO2: 93% 100%    Body mass index is 48.04 kg/m.  Intake/Output Summary (Last 24 hours) at 10/13/2019 1532 Last data filed at 10/13/2019 1518 Gross per 24 hour  Intake 1280 ml  Output --  Net  1280 ml     Sclerae unicteric  Oropharynx with oozing blood around her teeth  No peripheral adenopathy  Lungs clear -- no rales or rhonchi  Heart regular rate and rhythm  Abdomen benign  MSK: No lower extremity edema  CBG (last 3)  No results for input(s): GLUCAP in the last 72 hours.   Labs:  Urine Studies No results for input(s): UHGB, CRYS in the last 72 hours.  Invalid input(s): UACOL, UAPR, USPG, UPH, UTP, UGL, UKET, UBIL, UNIT, UROB, ULEU, UEPI, UWBC, URBC, UBAC, CAST, [place], [place]  Basic Metabolic Panel: Recent Labs  Lab 10/07/19 0650 10/07/19 0650 10/07/19 0700 10/07/19 0700 10/08/19 0258 10/08/19 0258 10/09/19 0242 10/09/19 0242 10/10/19 0303 10/13/19 0449  NA 136   < > 139  --  133*  --  137  --  137 138  K 4.0   < > 4.1   < > 3.9   < > 3.6   < > 3.5 3.4*  CL 105   < > 103  --  102  --  106  --  105 105  CO2 22  --   --   --  23  --  23  --  25 24  GLUCOSE 147*   < > 146*  --  161*  --  133*  --  138* 140*  BUN 15   < > 17  --  12  --  6  --  <5* 10  CREATININE 0.68   < > 0.50  --  0.66  --  0.50  --  0.62 0.65  CALCIUM 9.3  --   --   --  9.0  --  8.1*  --  8.1* 9.0   < > = values in this interval not displayed.   GFR Estimated Creatinine Clearance: 101.9 mL/min (by C-G formula based on SCr of 0.65 mg/dL). Liver Function Tests: Recent Labs  Lab 10/07/19 0650 10/13/19 0449  AST 21 41  ALT 21 36  ALKPHOS 69 55  BILITOT 0.5 0.3  PROT 8.3* 8.0  ALBUMIN 3.3* 3.3*   No results for input(s): LIPASE, AMYLASE in the last 168 hours. No results for input(s): AMMONIA in the last 168 hours. Coagulation profile Recent Labs  Lab 10/07/19 0650 10/10/19 0303 10/13/19 0449  INR 1.1 1.1 1.5*    CBC: Recent Labs  Lab 10/07/19 0650 10/07/19 0650 10/07/19 0700 10/08/19 0258 10/09/19 0242 10/10/19 0303 10/13/19 0449  WBC 20.0*  --   --  23.6* 19.0* 17.1* 13.2*  NEUTROABS 16.0*  --   --   --   --   --  8.9*  HGB 12.1   < > 13.6 11.6* 9.0* 9.1* 11.4*   HCT 39.1   < > 40.0 36.2 29.2* 28.8* 35.2*  MCV 90.7  --   --  90.7 90.1 90.9 89.3  PLT 132*  --   --  130* 154 157 <5*   < > = values in this interval not displayed.   Cardiac Enzymes: No results for input(s): CKTOTAL, CKMB, CKMBINDEX, TROPONINI in the last 168 hours. BNP: Invalid input(s): POCBNP CBG: Recent Labs  Lab 10/07/19 1153  GLUCAP 116*   D-Dimer No results for input(s): DDIMER in the last 72 hours. Hgb A1c No results for input(s): HGBA1C in the last 72 hours. Lipid Profile No results for input(s): CHOL, HDL, LDLCALC, TRIG, CHOLHDL, LDLDIRECT in the last 72 hours. Thyroid function studies No results for input(s): TSH, T4TOTAL, T3FREE, THYROIDAB in the last 72 hours.  Invalid input(s): FREET3 Anemia work up No results for input(s): VITAMINB12, FOLATE, FERRITIN, TIBC, IRON, RETICCTPCT in the last 72 hours. Microbiology Recent Results (from the past 240 hour(s))  SARS Coronavirus 2 by RT PCR (hospital order, performed in [facility] hospital lab) Nasopharyngeal Nasopharyngeal Swab     Status: None   Collection Time: 10/07/19  5:30 PM   Specimen: Nasopharyngeal Swab  Result Value Ref Range Status   SARS Coronavirus 2 NEGATIVE NEGATIVE Final    Comment: (NOTE) SARS-CoV-2 target nucleic acids are NOT DETECTED.  The SARS-CoV-2 RNA is generally detectable in upper and lower respiratory specimens during the acute phase of infection. The lowest concentration of SARS-CoV-2 viral copies this assay can detect is 250 copies / mL. A negative result does not preclude SARS-CoV-2 infection and should not be used as the sole basis for treatment or other patient management decisions.  A negative result may occur with improper specimen collection / handling, submission of specimen other than nasopharyngeal swab, presence of viral mutation(s) within the areas targeted by this assay, and inadequate number of viral copies (<250 copies / mL). A negative result must be combined with  clinical observations, patient history, and epidemiological information.  Fact Sheet for Patients:   BoilerBrush.com.cy  Fact Sheet for Healthcare Providers: https://pope.com/  This test is not yet approved or  cleared by the [place] FDA and has been authorized for detection  and/or diagnosis of SARS-CoV-2 by FDA under an Emergency Use Authorization (EUA).  This EUA will remain in effect (meaning this test can be used) for the duration of the COVID-19 declaration under Section 564(b)(1) of the Act, 21 U.S.C. section 360bbb-3(b)(1), unless the authorization is terminated or revoked sooner.  Performed at [facility], [address] [place], [place] [address]   SARS Coronavirus 2 by RT PCR (hospital order, performed in [facility] hospital lab) Nasopharyngeal Nasopharyngeal Swab     Status: None   Collection Time: 10/13/19  9:25 AM   Specimen: Nasopharyngeal Swab  Result Value Ref Range Status   SARS Coronavirus 2 NEGATIVE NEGATIVE Final    Comment: (NOTE) SARS-CoV-2 target nucleic acids are NOT DETECTED.  The SARS-CoV-2 RNA is generally detectable in upper and lower respiratory specimens during the acute phase of infection. The lowest concentration of SARS-CoV-2 viral copies this assay can detect is 250 copies / mL. A negative result does not preclude SARS-CoV-2 infection and should not be used as the sole basis for treatment or other patient management decisions.  A negative result may occur with improper specimen collection / handling, submission of specimen other than nasopharyngeal swab, presence of viral mutation(s) within the areas targeted by this assay, and inadequate number of viral copies (<250 copies / mL). A negative result must be combined with clinical observations, patient history, and epidemiological information.  Fact Sheet for Patients:   BoilerBrush.com.cy  Fact Sheet for  Healthcare Providers: https://pope.com/  This test is not yet approved or  cleared by the [place] FDA and has been authorized for detection and/or diagnosis of SARS-CoV-2 by FDA under an Emergency Use Authorization (EUA).  This EUA will remain in effect (meaning this test can be used) for the duration of the COVID-19 declaration under Section 564(b)(1) of the Act, 21 U.S.C. section 360bbb-3(b)(1), unless the authorization is terminated or revoked sooner.  Performed at [facility], [address] [place], [place] [address]       Studies:  CT Head Wo Contrast  Result Date: 10/13/2019 CLINICAL DATA:  Headache, intracranial hemorrhage suspected. EXAM: CT HEAD WITHOUT CONTRAST TECHNIQUE: Contiguous axial images were obtained from the base of the skull through the vertex without intravenous contrast. COMPARISON:  Prior head CT examinations 10/07/2019 and earlier. FINDINGS: Brain: Cerebral volume is normal. There is no acute intracranial hemorrhage. No demarcated cortical infarct. No extra-axial fluid collection. No evidence of intracranial mass. No midline shift. Unchanged small scattered parenchymal calcifications. Redemonstrated partially empty sella turcica. Vascular: No hyperdense vessel. Skull: Normal. Negative for fracture or focal lesion. Sinuses/Orbits: Visualized orbits show no acute finding. Minimal ethmoid sinus mucosal thickening. No significant mastoid effusion. IMPRESSION: No CT evidence of acute intracranial abnormality. Redemonstrated partially empty sella turcica. This is very commonly an incidental finding, but can be associated with idiopathic intracranial hypertension. Clinical correlation is recommended. Unchanged small scattered nonspecific parenchymal calcifications. Mild ethmoid sinus mucosal thickening at the imaged levels. Electronically Signed   By: Jackey Loge DO   On: 10/13/2019 11:36   CT Angio Chest PE W and/or Wo  Contrast  Result Date: 10/13/2019 CLINICAL DATA:  Hypotension.  Deep venous thrombosis EXAM: CT ANGIOGRAPHY CHEST WITH CONTRAST TECHNIQUE: Multidetector CT imaging of the chest was performed using the standard protocol during bolus administration of intravenous contrast. Multiplanar CT image reconstructions and MIPs were obtained to evaluate the vascular anatomy. CONTRAST:  100 mL Isovue 370 nonionic COMPARISON:  Chest radiograph October 07, 2019 FINDINGS:  Cardiovascular: There are multiple pulmonary emboli throughout both lower lobes pulmonary arteries as well as in the right upper lobe pulmonary artery and right middle lobe pulmonary artery. Pulmonary emboli are noted in the distal most aspect of the left main pulmonary artery. There is no pulmonary embolus in the right main pulmonary artery. The right ventricle to left ventricle diameter ratio is 1.0 consistent with a degree of right heart strain. There is no thoracic aortic aneurysm or dissection. Visualized great vessels appear normal. Note that the right innominate and left common carotid arteries arise as a common trunk, an anatomic variant. There is no pericardial effusion or pericardial thickening. Mediastinum/Nodes: Thyroid appears unremarkable. There is no appreciable thoracic adenopathy. No esophageal lesions are evident. Lungs/Pleura: Lungs are clear. No demonstrable pulmonary infarct. No pleural effusions are evident. Upper Abdomen: Visualized upper abdominal structures otherwise appear unremarkable. Musculoskeletal: No blastic or lytic bone lesions. No evident chest wall lesions. Review of the MIP images confirms the above findings. IMPRESSION: 1. Positive for acute PE with CT evidence of right heart strain (RV/LV Ratio = 1.0) consistent with at least submassive (intermediate risk) PE. The presence of right heart strain has been associated with an increased risk of morbidity and mortality. Pulmonary emboli are seen throughout multiple lower lobe  pulmonary artery branches as well as in right middle lobe and right upper lobe branches. Pulmonary embolus arises at the distal most aspect of the left main pulmonary artery. 2.  Lungs are clear. 3.  No evident adenopathy. 4.  Spleen absent. Critical Value/emergent results were called by telephone at the time of interpretation on 10/13/2019 at 11:41 am to provider [facility] , who verbally acknowledged these results. Electronically Signed   By: Bretta BangWilliam  Woodruff III M.D.   On: 10/13/2019 11:42   CT ABDOMEN PELVIS W CONTRAST  Result Date: 10/13/2019 CLINICAL DATA:  Thrombocytopenia with hypotension and right leg tingling. Status post left common iliac thrombectomy and stent placement 5 days ago. EXAM: CT ABDOMEN AND PELVIS WITH CONTRAST TECHNIQUE: Multidetector CT imaging of the abdomen and pelvis was performed using the standard protocol following bolus administration of intravenous contrast. CONTRAST:  100mL OMNIPAQUE IOHEXOL 350 MG/ML SOLN COMPARISON:  10/07/2019 FINDINGS: Lower chest: See CTA chest performed earlier today. Hepatobiliary: No suspicious focal abnormality within the liver parenchyma. There is no evidence for gallstones, gallbladder wall thickening, or pericholecystic fluid. No intrahepatic or extrahepatic biliary dilation. Pancreas: No focal mass lesion. No dilatation of the main duct. No intraparenchymal cyst. No peripancreatic edema. Spleen: Splenectomy with residual splenule. Adrenals/Urinary Tract: No adrenal nodule or mass. Kidneys unremarkable. No evidence for hydroureter. The urinary bladder appears normal for the degree of distention. Stomach/Bowel: Stomach is unremarkable. No gastric wall thickening. No evidence of outlet obstruction. Duodenum is normally positioned as is the ligament of Treitz. No small bowel wall thickening. No small bowel dilatation. The terminal ileum is normal. The appendix is not visualized, but there is no edema or inflammation in the region of the cecum. No  gross colonic mass. No colonic wall thickening. Vascular/Lymphatic: No abdominal aortic aneurysm. Interval left common/external iliac vein stent placement. No evidence for filling defect in the inferior IVC. Left iliac stent appears patent without discernible filling defect. The edema/stranding in the left pelvic sidewall around the iliac vessels is similar to the preprocedure study of 10/07/2019. There is no gastrohepatic or hepatoduodenal ligament lymphadenopathy. No retroperitoneal or mesenteric lymphadenopathy. No pelvic sidewall lymphadenopathy. Reproductive: Unremarkable. Other: No intraperitoneal free fluid. Musculoskeletal: 333 No worrisome lytic  or sclerotic osseous abnormality. IMPRESSION: 1. No acute findings in the abdomen or pelvis. 2. Interval left common/external iliac vein stent placement. No evidence for filling defect in the inferior IVC and stent device appears patent. 3. Similar appearance of the edema/stranding in the left pelvic sidewall around the iliac vessels compared to the pre procedure CT venogram. No evidence for new or progressive extraperitoneal hemorrhage. Electronically Signed   By: Kennith Center M.D.   On: 10/13/2019 12:18    Assessment: 44 y.o. female   1.  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  Submassive PE 3.  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  History of mild subarachnoid hemorrhage 5.  Obesity 6. Chronic headaches and dizziness  7. Depression   Plan:  -The patient has developed recurrent ITP and now has a platelet count of less than 5000.  She has actively bleeding gums.  Recommend dexamethasone 40 mg IV daily x4 days, IVIG 1 mg/kg x 2 doses, and Nplate 2 mcg/kg weekly. -Due to active bleeding, recommend platelet transfusion.  2 units of platelets have been ordered already. -Unfortunately, she has now developed  submassive PE.  Echocardiogram ordered and is pending. -Challenging situation giving anticoagulation in the setting of severe thrombocytopenia.  Will hold anticoagulation until platelets are at least 20-30,000 and then began heparin without bolus. -May need to consider IVC filter if she has significant residual DVT in her leg.   Clenton Pare, NP 10/13/2019  3:32 PM   Addendum  I have seen the patient, examined her. I agree with the assessment and and plan and have edited the notes.   I spoke with pt and her daughter, explained the difficult situation with b/l PE and severe thrombocytopenia. She needs a/c for PE but remains to be at very high risk for bleeding due to severe thrombocytopenia. I spoke with primary team multiple times this morning and also talked to Dr. Tonia Brooms around noon, particularly about intra pulmonary artery thrombolysis. I agree with heparin infusion wo bolus and keep APTT on low end of therapeutic range to reduce her risk of bleeding, and will try to keep her plt to be above 20-30K. Images reviewed, waiting for doppler to see if she still has significant residual DVT, to see if she needs a IVC filter. Dr. Jettie Booze has evaluated today.   Her repeat plt is 29K after 1u plt transfusion. I spoke with her nurse. OK to start heparin infusion from my standpoint.   We will f/u closely, please repeat CBC twice daily.   Malachy Mood  10/13/2019  6:28 PM

## 2019-10-13 NOTE — ED Provider Notes (Addendum)
[facility] EMERGENCY DEPARTMENT Provider Note   CSN: [id number] Arrival date & time: 10/13/19  0416     History Chief Complaint  Patient presents with  . bleeding gums  . Post-op Problem    [name] is a 44 y.o. female.  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  Presents with concern for bleeding gums, worried platelets again low.  Denies black or bloody stools, hematemesis, hematuria, falls (last 3wk ago), abdominal pain, chest pain or dyspnea. Denies dizziness. Reports her BP can be low but not sure numbers-per chart have been 100s.   Does acknowledge headache. Continued discomfort in LLE but also notes right hip area with sensation of tingling which is new. Located lateral right hip numbness/tingling.     Past Medical History:  Diagnosis Date  . Anemia   . Chronic ITP (idiopathic thrombocytopenia) (HCC)   . Depression   . Headache   . 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 Depression   . GERD (gastroesophageal reflux disease)   . May-Thurner syndrome   . DVT (deep venous thrombosis) (HCC) 10/07/2019  . History of ITP   . Leukocytosis   . Chronic headaches 08/28/2019  . Thrombocytopenia (HCC) 03/02/2019  . Borderline diabetes 10/16/2018  . Idiopathic thrombocytopenic purpura (ITP) (HCC) 10/14/2018  . Morbid obesity (HCC) 10/14/2018  . Hyperglycemia 10/14/2018  . 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 Chronic ITP (idiopathic thrombocytopenia) (HCC)   . Nontraumatic intracerebral hemorrhage (HCC)   . Menorrhagia with regular cycle   . Pressure ulcer 09/19/2015  . Gingival bleeding   . Chronic hepatitis  B (HCC) 09/04/2015  . Subarachnoidal hemorrhage (HCC) 09/04/2015  . Severe thrombocytopenia (HCC) 09/04/2015  . Acute ITP (HCC) 08/28/2015    Past Surgical History:  Procedure Laterality Date  . BREAST SURGERY  biopsy  . LOWER EXTREMITY VENOGRAPHY Left 10/08/2019   Procedure: LOWER EXTREMITY VENOGRAPHY;  Surgeon: Cephus Shelling, MD;  Location: [facility] INVASIVE CV LAB;  Service: Cardiovascular;  Laterality: Left;  . PERIPHERAL VASCULAR INTERVENTION Left 10/08/2019   Procedure: PERIPHERAL VASCULAR INTERVENTION;  Surgeon: Cephus Shelling, MD;  Location: [facility] INVASIVE CV LAB;  Service: Cardiovascular;  Laterality: Left;  LT COMMON ILIAC VEIN  . PERIPHERAL VASCULAR THROMBECTOMY N/A 10/08/2019   Procedure: PERIPHERAL VASCULAR THROMBECTOMY;  Surgeon: Cephus Shelling, MD;  Location: [facility] INVASIVE CV LAB;  Service: Cardiovascular;  Laterality: N/A;  . 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 Diabetes Mother   . Breast cancer Mother     Social History   Tobacco Use  .  Smoking status: Never Smoker  . Smokeless tobacco: Never Used  Vaping Use  . Vaping Use: Never used  Substance Use Topics  . Alcohol use: No    Alcohol/week: 0.0 standard drinks  . Drug use: No    Home Medications Prior to Admission medications   Medication Sig Start Date End Date Taking? Authorizing Provider  aspirin EC 81 MG EC tablet Take 1 tablet (81 mg total) by mouth daily. Swallow whole. 10/11/19  Yes Meredeth IdeLama, Gagan S, MD  enoxaparin (LOVENOX) 150 MG/ML injection Inject 1 mL (150 mg total) into the skin daily for 5 days. 10/11/19 10/16/19 Yes Meredeth IdeLama, Gagan S, MD  warfarin (COUMADIN)  5 MG tablet Take 1 tablet (5 mg total) by mouth daily. 10/11/19 01/09/20 Yes Meredeth IdeLama, Gagan S, MD  HYDROcodone-acetaminophen (NORCO/VICODIN) 5-325 MG tablet Take 1 tablet by mouth every 4 (four) hours as needed for severe pain. Patient not taking: Reported on 10/13/2019 09/19/19 09/18/20  Elson AreasSofia, Leslie K, PA-C  meclizine (ANTIVERT) 25 MG tablet Take 1 tablet (25 mg total) by mouth 3 (three) times daily as needed for dizziness. Patient not taking: Reported on 10/13/2019 08/11/19   [name], [name], MD  pantoprazole (PROTONIX) 40 MG tablet Take 1 tablet (40 mg total) by mouth daily. Patient not taking: Reported on 10/13/2019 08/06/19   Myrtis Serurcio, Kristin R, NP    Allergies    Patient has no known allergies.  Review of Systems   Review of Systems  Constitutional: Negative for fever.  HENT: Positive for mouth sores (bleeding from gums). Negative for sore throat.   Eyes: Negative for visual disturbance.  Respiratory: Negative for cough and shortness of breath.   Cardiovascular: Negative for chest pain.  Gastrointestinal: Negative for abdominal pain, nausea and vomiting.  Genitourinary: Negative for difficulty urinating.  Musculoskeletal: Negative for back pain and neck pain.  Skin: Negative for rash.  Neurological: Positive for numbness (tingling/pain to right lateral leg) and headaches. Negative for syncope.    Physical Exam Updated Vital Signs BP 123/80   Pulse 90   Temp 98.7 F (37.1 C) (Oral)   Resp 17   Ht 5' (1.524 m)   Wt (!) 111.6 kg   SpO2 97%   BMI 48.04 kg/m   Physical Exam Vitals and nursing note reviewed.  Constitutional:      General: She is not in acute distress.    Appearance: She is well-developed. She is not diaphoretic.  HENT:     Head: Normocephalic and atraumatic.     Mouth/Throat:     Comments: Bleeding around front tooth, controlled with pressure Eyes:     Conjunctiva/sclera: Conjunctivae normal.  Cardiovascular:     Rate and Rhythm: Normal rate and regular rhythm.      Heart sounds: Normal heart sounds. No murmur heard.  No friction rub. No gallop.   Pulmonary:     Effort: Pulmonary effort is normal. No respiratory distress.     Breath sounds: Normal breath sounds. No wheezing or rales.  Abdominal:     General: There is no distension.     Palpations: Abdomen is soft.     Tenderness: There is no abdominal tenderness. There is no guarding.  Musculoskeletal:        General: No tenderness.     Cervical back: Normal range of motion.  Skin:    General: Skin is warm and dry.     Findings: No erythema or rash.     Comments: Petechiae and contusion right hip, altered sensation right  lateral leg  Neurological:     Mental Status: She is alert and oriented to person, place, and time.     ED Results / Procedures / Treatments   Labs (all labs ordered are listed, but only abnormal results are displayed) Labs Reviewed  COMPREHENSIVE METABOLIC PANEL - Abnormal; Notable for the following components:      Result Value   Potassium 3.4 (*)    Glucose, Bld 140 (*)    Albumin 3.3 (*)    All other components within normal limits  CBC WITH DIFFERENTIAL/PLATELET - Abnormal; Notable for the following components:   WBC 13.2 (*)    Hemoglobin 11.4 (*)    HCT 35.2 (*)    Platelets <5 (*)    Neutro Abs 8.9 (*)    Abs Immature Granulocytes 0.31 (*)    All other components within normal limits  PROTIME-INR - Abnormal; Notable for the following components:   Prothrombin Time 17.8 (*)    INR 1.5 (*)    All other components within normal limits  URINALYSIS, ROUTINE W REFLEX MICROSCOPIC - Abnormal; Notable for the following components:   Specific Gravity, Urine 1.045 (*)    Hgb urine dipstick MODERATE (*)    Ketones, ur 5 (*)    Leukocytes,Ua MODERATE (*)    Bacteria, UA FEW (*)    All other components within normal limits  CBC - Abnormal; Notable for the following components:   WBC 18.2 (*)    RBC 3.85 (*)    Hemoglobin 10.8 (*)    HCT 34.7 (*)    Platelets 29  (*)    nRBC 7.1 (*)    All other components within normal limits  SARS CORONAVIRUS 2 BY RT PCR (HOSPITAL ORDER, PERFORMED IN [facility])  URINE CULTURE  CBC  HEPARIN LEVEL (UNFRACTIONATED)  CBC  I-STAT BETA HCG BLOOD, ED ([facility], [facility], [facility] ONLY)  TYPE AND SCREEN  PREPARE PLATELET PHERESIS  PREPARE PLATELET PHERESIS  TROPONIN I (HIGH SENSITIVITY)  TROPONIN I (HIGH SENSITIVITY)    EKG EKG Interpretation  Date/Time:  Monday October 13 2019 08:47:42 EDT Ventricular Rate:  75 PR Interval:    QRS Duration: 89 QT Interval:  394 QTC Calculation: 441 R Axis:   78 Text Interpretation: Sinus rhythm No significant change since last tracing Confirmed by Alvira Monday ([id number]) on 10/13/2019 9:35:07 AM   Radiology CT Head Wo Contrast  Result Date: 10/13/2019 CLINICAL DATA:  Headache, intracranial hemorrhage suspected. EXAM: CT HEAD WITHOUT CONTRAST TECHNIQUE: Contiguous axial images were obtained from the base of the skull through the vertex without intravenous contrast. COMPARISON:  Prior head CT examinations 10/07/2019 and earlier. FINDINGS: Brain: Cerebral volume is normal. There is no acute intracranial hemorrhage. No demarcated cortical infarct. No extra-axial fluid collection. No evidence of intracranial mass. No midline shift. Unchanged small scattered parenchymal calcifications. Redemonstrated partially empty sella turcica. Vascular: No hyperdense vessel. Skull: Normal. Negative for fracture or focal lesion. Sinuses/Orbits: Visualized orbits show no acute finding. Minimal ethmoid sinus mucosal thickening. No significant mastoid effusion. IMPRESSION: No CT evidence of acute intracranial abnormality. Redemonstrated partially empty sella turcica. This is very commonly an incidental finding, but can be associated with idiopathic intracranial hypertension. Clinical correlation is recommended. Unchanged small scattered nonspecific parenchymal calcifications. Mild ethmoid sinus mucosal  thickening at the imaged levels. Electronically Signed   By: Jackey Loge DO   On: 10/13/2019 11:36   CT Angio Chest PE W and/or Wo Contrast  Result Date: 10/13/2019 CLINICAL DATA:  Hypotension.  Deep venous thrombosis EXAM: CT ANGIOGRAPHY CHEST WITH CONTRAST TECHNIQUE: Multidetector CT imaging of the chest was performed using the standard protocol during bolus administration of intravenous contrast. Multiplanar CT image reconstructions and MIPs were obtained to evaluate the vascular anatomy. CONTRAST:  100 mL Isovue 370 nonionic COMPARISON:  Chest radiograph October 07, 2019 FINDINGS: Cardiovascular: There are multiple pulmonary emboli throughout both lower lobes pulmonary arteries as well as in the right upper lobe pulmonary artery and right middle lobe pulmonary artery. Pulmonary emboli are noted in the distal most aspect of the left main pulmonary artery. There is no pulmonary embolus in the right main pulmonary artery. The right ventricle to left ventricle diameter ratio is 1.0 consistent with a degree of right heart strain. There is no thoracic aortic aneurysm or dissection. Visualized great vessels appear normal. Note that the right innominate and left common carotid arteries arise as a common trunk, an anatomic variant. There is no pericardial effusion or pericardial thickening. Mediastinum/Nodes: Thyroid appears unremarkable. There is no appreciable thoracic adenopathy. No esophageal lesions are evident. Lungs/Pleura: Lungs are clear. No demonstrable pulmonary infarct. No pleural effusions are evident. Upper Abdomen: Visualized upper abdominal structures otherwise appear unremarkable. Musculoskeletal: No blastic or lytic bone lesions. No evident chest wall lesions. Review of the MIP images confirms the above findings. IMPRESSION: 1. Positive for acute PE with CT evidence of right heart strain (RV/LV Ratio = 1.0) consistent with at least submassive (intermediate risk) PE. The presence of right heart strain  has been associated with an increased risk of morbidity and mortality. Pulmonary emboli are seen throughout multiple lower lobe pulmonary artery branches as well as in right middle lobe and right upper lobe branches. Pulmonary embolus arises at the distal most aspect of the left main pulmonary artery. 2.  Lungs are clear. 3.  No evident adenopathy. 4.  Spleen absent. Critical Value/emergent results were called by telephone at the time of interpretation on 10/13/2019 at 11:41 am to provider [facility] , who verbally acknowledged these results. Electronically Signed   By: Bretta Bang III M.D.   On: 10/13/2019 11:42   CT ABDOMEN PELVIS W CONTRAST  Result Date: 10/13/2019 CLINICAL DATA:  Thrombocytopenia with hypotension and right leg tingling. Status post left common iliac thrombectomy and stent placement 5 days ago. EXAM: CT ABDOMEN AND PELVIS WITH CONTRAST TECHNIQUE: Multidetector CT imaging of the abdomen and pelvis was performed using the standard protocol following bolus administration of intravenous contrast. CONTRAST:  OMNIPAQUE IOHEXOL 350 MG/ML SOLN COMPARISON:  10/07/2019 FINDINGS: Lower chest: See CTA chest performed earlier today. Hepatobiliary: No suspicious focal abnormality within the liver parenchyma. There is no evidence for gallstones, gallbladder wall thickening, or pericholecystic fluid. No intrahepatic or extrahepatic biliary dilation. Pancreas: No focal mass lesion. No dilatation of the main duct. No intraparenchymal cyst. No peripancreatic edema. Spleen: Splenectomy with residual splenule. Adrenals/Urinary Tract: No adrenal nodule or mass. Kidneys unremarkable. No evidence for hydroureter. The urinary bladder appears normal for the degree of distention. Stomach/Bowel: Stomach is unremarkable. No gastric wall thickening. No evidence of outlet obstruction. Duodenum is normally positioned as is the ligament of Treitz. No small bowel wall thickening. No small bowel dilatation. The  terminal ileum is normal. The appendix is not visualized, but there is no edema or inflammation in the region of the cecum. No gross colonic mass. No colonic wall thickening. Vascular/Lymphatic: No abdominal aortic aneurysm. Interval left common/external iliac vein stent placement. No evidence for filling defect in the inferior  IVC. Left iliac stent appears patent without discernible filling defect. The edema/stranding in the left pelvic sidewall around the iliac vessels is similar to the preprocedure study of 10/07/2019. There is no gastrohepatic or hepatoduodenal ligament lymphadenopathy. No retroperitoneal or mesenteric lymphadenopathy. No pelvic sidewall lymphadenopathy. Reproductive: Unremarkable. Other: No intraperitoneal free fluid. Musculoskeletal: 333 No worrisome lytic or sclerotic osseous abnormality. IMPRESSION: 1. No acute findings in the abdomen or pelvis. 2. Interval left common/external iliac vein stent placement. No evidence for filling defect in the inferior IVC and stent device appears patent. 3. Similar appearance of the edema/stranding in the left pelvic sidewall around the iliac vessels compared to the pre procedure CT venogram. No evidence for new or progressive extraperitoneal hemorrhage. Electronically Signed   By: Kennith Center M.D.   On: 10/13/2019 12:18   ECHOCARDIOGRAM COMPLETE  Result Date: 10/13/2019    ECHOCARDIOGRAM REPORT   Patient Name:   [facility] Date of Exam: 10/13/2019 Medical Rec #:  [id number]                Height:       60.0 in Accession #:    [id number]               Weight:       246.0 lb Date of Birth:  1975-05-05                 BSA:          2.038 m Patient Age:    43 years                 BP:           121/75 mmHg Patient Gender: F                        HR:           77 bpm. Exam Location:  Inpatient Procedure: 2D Echo, Cardiac Doppler and Color Doppler STAT ECHO Indications:    Pulmonary embolus  History:        Patient has no prior history of  Echocardiogram examinations.                 Signs/Symptoms:Morbid obesity; Risk Factors:DVT and Diabetes.  Sonographer:    Lavenia Atlas Referring Phys: [phone] GRACE E BOWSER IMPRESSIONS  1. Left ventricular ejection fraction, by estimation, is 60 to 65%. The left ventricle has normal function. The left ventricle has no regional wall motion abnormalities. There is mild concentric left ventricular hypertrophy. Left ventricular diastolic parameters were normal.  2. Right ventricular systolic function is normal. The right ventricular size is normal.  3. The mitral valve is grossly normal. Trivial mitral valve regurgitation. No evidence of mitral stenosis.  4. The aortic valve is grossly normal. Aortic valve regurgitation is not visualized. No aortic stenosis is present. Conclusion(s)/Recommendation(s): Normal biventricular function without evidence of hemodynamically significant valvular heart disease. FINDINGS  Left Ventricle: Left ventricular ejection fraction, by estimation, is 60 to 65%. The left ventricle has normal function. The left ventricle has no regional wall motion abnormalities. The left ventricular internal cavity size was normal in size. There is  mild concentric left ventricular hypertrophy. Left ventricular diastolic parameters were normal. Normal left ventricular filling pressure. Right Ventricle: The right ventricular size is normal. No increase in right ventricular wall thickness. Right ventricular systolic function is normal. Left Atrium: Left atrial size was normal in size. Right Atrium: Right atrial size was normal  in size. Pericardium: There is no evidence of pericardial effusion. Mitral Valve: The mitral valve is grossly normal. Trivial mitral valve regurgitation. No evidence of mitral valve stenosis. Tricuspid Valve: The tricuspid valve is grossly normal. Tricuspid valve regurgitation is trivial. No evidence of tricuspid stenosis. Aortic Valve: The aortic valve is grossly normal. Aortic  valve regurgitation is not visualized. No aortic stenosis is present. Pulmonic Valve: The pulmonic valve was grossly normal. Pulmonic valve regurgitation is not visualized. No evidence of pulmonic stenosis. Aorta: The aortic root is normal in size and structure. Venous: The inferior vena cava was not well visualized. IAS/Shunts: The atrial septum is grossly normal.  LEFT VENTRICLE PLAX 2D LVIDd:         3.70 cm  Diastology LVIDs:         2.60 cm  LV e' lateral:   7.83 cm/s LV PW:         1.20 cm  LV E/e' lateral: 8.6 LV IVS:        1.20 cm  LV e' medial:    5.33 cm/s LVOT diam:     2.30 cm  LV E/e' medial:  12.7 LV SV:         117 LV SV Index:   57 LVOT Area:     4.15 cm  RIGHT VENTRICLE RV Basal diam:  2.60 cm RV S prime:     6.64 cm/s TAPSE (M-mode): 2.4 cm LEFT ATRIUM             Index       RIGHT ATRIUM           Index LA diam:        3.20 cm 1.57 cm/m  RA Area:     12.60 cm LA Vol (A2C):   31.7 ml 15.55 ml/m RA Volume:   27.80 ml  13.64 ml/m LA Vol (A4C):   36.1 ml 17.71 ml/m LA Biplane Vol: 34.8 ml 17.07 ml/m  AORTIC VALVE LVOT Vmax:   122.00 cm/s LVOT Vmean:  87.100 cm/s LVOT VTI:    0.281 m  AORTA Ao Root diam: 2.30 cm MITRAL VALVE               TRICUSPID VALVE MV Area (PHT): 3.99 cm    TR Peak grad:   19.4 mmHg MV Decel Time: 190 msec    TR Vmax:        220.00 cm/s MV E velocity: 67.70 cm/s MV A velocity: 86.10 cm/s  SHUNTS MV E/A ratio:  0.79        Systemic VTI:  0.28 m                            Systemic Diam: 2.30 cm Lennie Odor MD Electronically signed by Lennie Odor MD Signature Date/Time: 10/13/2019/4:43:46 PM    Final     Procedures .Critical Care Performed by: Alvira Monday, MD Authorized by: Alvira Monday, MD   Critical care provider statement:    Critical care time (minutes):  90   Critical care was time spent personally by me on the following activities:  Discussions with consultants, evaluation of patient's response to treatment, examination of patient, ordering and  performing treatments and interventions, ordering and review of laboratory studies, ordering and review of radiographic studies, pulse oximetry, re-evaluation of patient's condition, obtaining history from patient or surrogate and review of old charts   (including critical care time)  Medications Ordered in ED Medications  sodium chloride flush (NS) 0.9 % injection 3 mL (3 mLs Intravenous Not Given 10/13/19 1530)  Immune Globulin 10% (PRIVIGEN) IV infusion 110 g ( Intravenous Rate/Dose Change 10/13/19 1320)  iohexol (OMNIPAQUE) 350 MG/ML injection 100 mL (has no administration in time range)  docusate sodium (COLACE) capsule 100 mg (has no administration in time range)  polyethylene glycol (MIRALAX / GLYCOLAX) packet 17 g (has no administration in time range)  pantoprazole (PROTONIX) EC tablet 40 mg (has no administration in time range)  heparin ADULT infusion 100 units/mL (25000 units/293mL sodium chloride 0.45%) (1,250 Units/hr Intravenous New Bag/Given 10/13/19 1936)  sodium chloride 0.9 % bolus 1,000 mL (0 mLs Intravenous Stopped 10/13/19 1248)  dexamethasone (DECADRON) injection 40 mg (40 mg Intravenous Given 10/13/19 1002)  romiPLOStim (NPLATE) injection 225 mcg (225 mcg Subcutaneous Given 10/13/19 1007)  iohexol (OMNIPAQUE) 350 MG/ML injection 100 mL (100 mLs Intravenous Contrast Given 10/13/19 1206)  0.9 %  sodium chloride infusion (Manually program via Guardrails IV Fluids) ( Intravenous Stopped 10/13/19 1653)  0.9 %  sodium chloride infusion (Manually program via Guardrails IV Fluids) ( Intravenous Stopped 10/13/19 1914)  potassium chloride SA (KLOR-CON) CR tablet 40 mEq (40 mEq Oral Given 10/13/19 1935)    ED Course  I have reviewed the triage vital signs and the nursing notes.  Pertinent labs & imaging results that were available during my care of the patient were reviewed by me and considered in my medical decision making (see chart for details).    MDM Rules/Calculators/A&P                            44 year old female with history of chronic ITP followed by Dr. 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  Initial triage blood pressure in the 130s, however noted to have blood pressures 80s and 90s systolic in her room in the emergency department.  Labs are significant for platelets less than 5.  Given headache, CT head was completed which showed no evidence of acute abnormalities.  Noted during IV placement at least superficial thrombus in LUE--will evaluate for upper extremity DVT with [place] and consult vascular regarding recommendations in pt with severe thrombocytopenia secondary to ITP and known DVT//need for possible IVC filter.   Given hypotension, known DVT, PE study ordered as well as CT abdomen/pelvis for evaluation for possible spontaneous retroperitoneal hemorrhage in the setting of thrombocytopenia and sensation of right hip tingling.     Consulted Dr. Mosetta Putt regarding the thrombocytopenia. Recommends decadron 40mg , IVIG, NPLATE and holding coumadin as well as vascular consult.   CT PE and CT A/P obtained show PE with concern for right heart strain. Unable to obtain specific venogram given other studies ordered.  No RP hemorrhage, bleeding around stent had been present prior secondary to DVT, no significant increase.  Do not suspect other intraspinal etiology of tingling of right hip, suspect likely local nerve symptoms from contusion. Denies weakness, numbness, loss of control of bowel or bladder.   In setting of hypotension and PE finding in pt with thrombocytopenia, consulted pulmonology/CCM.  Blood pressures improved.  PCCM continues to recommend admission, plan to given platelets and then start heparin when platelet levels have increased.    Admitted to [facility] with hematology and vascular surgery consults for  further care.        Final  Clinical Impression(s) / ED Diagnoses Final diagnoses:  Idiopathic thrombocytopenic purpura (ITP) (HCC)  Thrombocytopenia (HCC)  Acute pulmonary embolism, unspecified pulmonary embolism type, unspecified whether acute cor pulmonale present [facility])    Rx / DC Orders ED Discharge Orders    None       Alvira Monday, MD 10/13/19 2124    Alvira Monday, MD 11/07/19 1541

## 2019-10-14 ENCOUNTER — Inpatient Hospital Stay: Payer: Self-pay | Admitting: Nurse Practitioner

## 2019-10-14 ENCOUNTER — Inpatient Hospital Stay (HOSPITAL_COMMUNITY): Payer: Medicaid Other

## 2019-10-14 ENCOUNTER — Inpatient Hospital Stay: Payer: Self-pay

## 2019-10-14 ENCOUNTER — Encounter (HOSPITAL_COMMUNITY): Payer: Self-pay

## 2019-10-14 DIAGNOSIS — Z86711 Personal history of pulmonary embolism: Secondary | ICD-10-CM

## 2019-10-14 DIAGNOSIS — I2699 Other pulmonary embolism without acute cor pulmonale: Secondary | ICD-10-CM

## 2019-10-14 DIAGNOSIS — D693 Immune thrombocytopenic purpura: Principal | ICD-10-CM

## 2019-10-14 LAB — BPAM PLATELET PHERESIS
Blood Product Expiration Date: 202107272359
Blood Product Expiration Date: 202107292359
ISSUE DATE / TIME: 202107261448
ISSUE DATE / TIME: 202107261508
Unit Type and Rh: 5100
Unit Type and Rh: 6200

## 2019-10-14 LAB — PREPARE PLATELET PHERESIS
Unit division: 0
Unit division: 0

## 2019-10-14 LAB — CBC
HCT: 30.8 % — ABNORMAL LOW (ref 36.0–46.0)
HCT: 31 % — ABNORMAL LOW (ref 36.0–46.0)
HCT: 30.8 % — ABNORMAL LOW (ref 36.0–46.0)
Hemoglobin: 10 g/dL — ABNORMAL LOW (ref 12.0–15.0)
Hemoglobin: 9.9 g/dL — ABNORMAL LOW (ref 12.0–15.0)
Hemoglobin: 9.7 g/dL — ABNORMAL LOW (ref 12.0–15.0)
MCH: 28.7 pg (ref 26.0–34.0)
MCH: 28.4 pg (ref 26.0–34.0)
MCH: 28 pg (ref 26.0–34.0)
MCHC: 32.5 g/dL (ref 30.0–36.0)
MCHC: 31.9 g/dL (ref 30.0–36.0)
MCHC: 31.5 g/dL (ref 30.0–36.0)
MCV: 88.3 fL (ref 80.0–100.0)
MCV: 88.8 fL (ref 80.0–100.0)
MCV: 89 fL (ref 80.0–100.0)
Platelets: 49 10*3/uL — ABNORMAL LOW (ref 150–400)
Platelets: 42 10*3/uL — ABNORMAL LOW (ref 150–400)
Platelets: 62 10*3/uL — ABNORMAL LOW (ref 150–400)
RBC: 3.49 MIL/uL — ABNORMAL LOW (ref 3.87–5.11)
RBC: 3.49 MIL/uL — ABNORMAL LOW (ref 3.87–5.11)
RBC: 3.46 MIL/uL — ABNORMAL LOW (ref 3.87–5.11)
RDW: 14.8 % (ref 11.5–15.5)
RDW: 14.8 % (ref 11.5–15.5)
RDW: 15.3 % (ref 11.5–15.5)
WBC: 21 10*3/uL — ABNORMAL HIGH (ref 4.0–10.5)
WBC: 25.1 10*3/uL — ABNORMAL HIGH (ref 4.0–10.5)
WBC: 24.6 10*3/uL — ABNORMAL HIGH (ref 4.0–10.5)
nRBC: 4.8 % — ABNORMAL HIGH (ref 0.0–0.2)
nRBC: 3.2 % — ABNORMAL HIGH (ref 0.0–0.2)
nRBC: 3 % — ABNORMAL HIGH (ref 0.0–0.2)

## 2019-10-14 LAB — HEPARIN LEVEL (UNFRACTIONATED)
Heparin Unfractionated: 0.44 IU/mL (ref 0.30–0.70)
Heparin Unfractionated: 0.65 IU/mL (ref 0.30–0.70)
Heparin Unfractionated: 0.66 IU/mL (ref 0.30–0.70)

## 2019-10-14 NOTE — Progress Notes (Addendum)
NAME:  [name], MRN:  [id number], DOB:  21-Jun-1975, LOS: 1 ADMISSION DATE:  10/13/2019, CONSULTATION DATE:  10/13/2019 REFERRING MD:  Alvira Monday. MD, CHIEF COMPLAINT:  Bleeding gums  Brief History   Consulted for evaluation of multiple pulmonary emboli in left main pulmonary artery, both lower pulmonary arteries, right middle lobe pulmonary artery and right upper lobe pulmonary artery.   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 Platelet count was 157K on 10/10/2019. She was found to be Thrombocytopenic to 5< K/undetectable on admission.    Nplate held since her last admission. Patient reports compliance on Coumadin, Lovenox, and Asprin 81mg  since discharge. Follow up was schedule with hematology for further adjustment to Coumadin and Lovenox.   Denies recent illness, fever, sore throat, shortness of breath, cough, chest pain, hematuria, hematochezia, or difficulty ambulating.  Past Medical History   has a past medical history of Anemia, Chronic ITP (idiopathic thrombocytopenia) (HCC), Depression, Headache, and Retinal hemorrhage of both eyes (10/05/2015).  Significant Hospital Events   Admit 7/26   Consults:  PCCM Vascular Surgery  Procedures:    Significant Diagnostic Tests:  CT Head WO Contrast 7/26: No CT evidence of acute intracranial abnormality. Empty Sella Turcica.  CT Angio Chest PE w/wo Contrast 7/26 : Positive for acute PE with CT evidence of right heart strain (RV/LV Ratio = 1.0) consistent with at least submassive (intermediate risk) PE. The presence of right heart strain has beenassociated with an increased risk of morbidity and mortality.Pulmonary emboli are seen throughout multiple lower lobe pulmonaryartery  branches as well as in right middle lobe and right upper lobebranches. Pulmonary embolus arises at the distal most aspect of the left main pulmonary artery. CT abdomen pelvis w contrast 7/26 : No acute findings in the abdomen or pelvis. Interval left common/external iliac vein stent placement. No evidence for filling defect in the inferior IVC and stent device appears patent. 3. Similar appearance of the edema/stranding in the left pelvic sidewall around the iliac vessels compared to the pre procedure CT venogram. No evidence for new or progressive extraperitoneal hemorrhage.  Echo 7/26 normal LV function normal RV function  Micro Data:  SARS Coronavirus: Negative   Antimicrobials:  n/a  Interim history/subjective:   No dyspnea or chest pain Complains of decreased sensation lateral right thigh  Objective   Blood pressure 114/68, pulse 76, temperature 98 F (36.7 C), temperature source Oral, resp. rate 21, height 5' (1.524 m), weight (!) 111.6 kg, SpO2 95 %.        Intake/Output Summary (Last 24 hours) at 10/14/2019 1155 Last data filed at 10/14/2019 0800 Gross per 24 hour  Intake 2066.97 ml  Output --  Net 2066.97 ml   Filed Weights   10/13/19 0421  Weight: (!) 111.6 kg    Examination: General: Obese, NAD, lying supine in bed no distress HENT: [place]/AT, EOMI, PERRL, no rhinorrhea,  Lungs: CTAB, no wheezes rhonchi or rales  Cardiovascular: Regular rate and rhythm, no murmurs rubs or gallops Abdomen: Large pannus , multiple ecchymosis-(Lovenox shots) , mild diffuse tenderness  Extremities: No lower extremity edema, pre tibial measurements are equal, no cyanosis , no phlegmasia cerulea dolens  Neuro: Alert, interactive, grossly nonfocal. Decreased sensations right lateral part of thigh  Resolved Hospital Problem list  N/a   Assessment & Plan:  ITP -Platelet count has improved to 40 9K after IVIG, Nplate and steroids - Dexamethasone 40 mg daily for 4 days -Hematology  following  May Thurner s/p left iliac vein stent Submassive PE Vascular surgery consulted in ED Plan: -Continue IV heparin , eventually transition back to Coumadin -Normal RV function on echo is reassuring -Vascular recommending against IVC filter  Lateral thigh paresthesia may be related to compression of lateral cutaneous nerve of thigh during surgery Monitor  Code Status: Full Family Communication: Daughter updated bedside, ( daughter speaks [place] and [place]) Disposition: ICU  Can transfer to stepdown unit and to triad 7/28  Cyril Mourning MD. Tonny Bollman. [facility]  If no response to pager , please call [phone]   10/14/2019

## 2019-10-14 NOTE — Progress Notes (Signed)
IVC/iliac veins and LEV completed. Refer to [facility] under chart review to view preliminary results.   10/14/2019  2:33 PM Zavien Clubb, Gerarda Gunther

## 2019-10-14 NOTE — Progress Notes (Addendum)
[name]   DOB:07/09/1975   UY#:[id number]   DGL#:[id number]  Hematology follow up   Subjective: Daughter is at the bedside today.  The patient reported a very small amount of gum bleeding earlier today which stopped on its own.  She has not noticed any other bleeding.  She denies chest pain and shortness of breath.  Remains on heparin drip.  Objective:  Vitals:   10/14/19 1200 10/14/19 1226  BP: (!) 90/44   Pulse: 70   Resp: 17   Temp:  97.7 F (36.5 C)  SpO2: 98%     Body mass index is 48.04 kg/m.  Intake/Output Summary (Last 24 hours) at 10/14/2019 1457 Last data filed at 10/14/2019 1200 Gross per 24 hour  Intake 1342.13 ml  Output --  Net 1342.13 ml     Sclerae unicteric  Oropharynx no bleeding noted  No peripheral adenopathy  Lungs clear -- no rales or rhonchi  Heart regular rate and rhythm  Abdomen benign  MSK: No lower extremity edema  CBG (last 3)  No results for input(s): GLUCAP in the last 72 hours.   Labs:  Urine Studies No results for input(s): UHGB, CRYS in the last 72 hours.  Invalid input(s): UACOL, UAPR, USPG, UPH, UTP, UGL, UKET, UBIL, UNIT, UROB, ULEU, UEPI, UWBC, URBC, UBAC, CAST, [place], [place]  Basic Metabolic Panel: Recent Labs  Lab 10/08/19 0258 10/08/19 0258 10/09/19 0242 10/09/19 0242 10/10/19 0303 10/13/19 0449  NA 133*  --  137  --  137 138  K 3.9   < > 3.6   < > 3.5 3.4*  CL 102  --  106  --  105 105  CO2 23  --  23  --  25 24  GLUCOSE 161*  --  133*  --  138* 140*  BUN 12  --  6  --  <5* 10  CREATININE 0.66  --  0.50  --  0.62 0.65  CALCIUM 9.0  --  8.1*  --  8.1* 9.0   < > = values in this interval not displayed.   GFR Estimated Creatinine Clearance: 101.9 mL/min (by C-G formula based on SCr of 0.65 mg/dL). Liver Function Tests: Recent Labs  Lab 10/13/19 0449  AST 41  ALT 36  ALKPHOS 55  BILITOT 0.3  PROT 8.0  ALBUMIN 3.3*   No results for input(s): LIPASE, AMYLASE in the last 168 hours. No results for  input(s): AMMONIA in the last 168 hours. Coagulation profile Recent Labs  Lab 10/10/19 0303 10/13/19 0449  INR 1.1 1.5*    CBC: Recent Labs  Lab 10/10/19 0303 10/13/19 0449 10/13/19 1629 10/14/19 0232 10/14/19 0928  WBC 17.1* 13.2* 18.2* 21.0* 25.1*  NEUTROABS  --  8.9*  --   --   --   HGB 9.1* 11.4* 10.8* 10.0* 9.9*  HCT 28.8* 35.2* 34.7* 30.8* 31.0*  MCV 90.9 89.3 90.1 88.3 88.8  PLT 157 <5* 29* 49* 42*   Cardiac Enzymes: No results for input(s): CKTOTAL, CKMB, CKMBINDEX, TROPONINI in the last 168 hours. BNP: Invalid input(s): POCBNP CBG: No results for input(s): GLUCAP in the last 168 hours. D-Dimer No results for input(s): DDIMER in the last 72 hours. Hgb A1c No results for input(s): HGBA1C in the last 72 hours. Lipid Profile No results for input(s): CHOL, HDL, LDLCALC, TRIG, CHOLHDL, LDLDIRECT in the last 72 hours. Thyroid function studies No results for input(s): TSH, T4TOTAL, T3FREE, THYROIDAB in the last 72 hours.  Invalid input(s): FREET3 Anemia  work up No results for input(s): VITAMINB12, FOLATE, FERRITIN, TIBC, IRON, RETICCTPCT in the last 72 hours. Microbiology Recent Results (from the past 240 hour(s))  SARS Coronavirus 2 by RT PCR (hospital order, performed in [facility] hospital lab) Nasopharyngeal Nasopharyngeal Swab     Status: None   Collection Time: 10/07/19  5:30 PM   Specimen: Nasopharyngeal Swab  Result Value Ref Range Status   SARS Coronavirus 2 NEGATIVE NEGATIVE Final    Comment: (NOTE) SARS-CoV-2 target nucleic acids are NOT DETECTED.  The SARS-CoV-2 RNA is generally detectable in upper and lower respiratory specimens during the acute phase of infection. The lowest concentration of SARS-CoV-2 viral copies this assay can detect is 250 copies / mL. A negative result does not preclude SARS-CoV-2 infection and should not be used as the sole basis for treatment or other patient management decisions.  A negative result may occur  with improper specimen collection / handling, submission of specimen other than nasopharyngeal swab, presence of viral mutation(s) within the areas targeted by this assay, and inadequate number of viral copies (<250 copies / mL). A negative result must be combined with clinical observations, patient history, and epidemiological information.  Fact Sheet for Patients:   StrictlyIdeas.no  Fact Sheet for Healthcare Providers: BankingDealers.co.za  This test is not yet approved or  cleared by the [place] FDA and has been authorized for detection and/or diagnosis of SARS-CoV-2 by FDA under an Emergency Use Authorization (EUA).  This EUA will remain in effect (meaning this test can be used) for the duration of the COVID-19 declaration under Section 564(b)(1) of the Act, 21 U.S.C. section 360bbb-3(b)(1), unless the authorization is terminated or revoked sooner.  Performed at [facility], [place] [address] [place], [place] [address]   SARS Coronavirus 2 by RT PCR (hospital order, performed in [facility] hospital lab) Nasopharyngeal Nasopharyngeal Swab     Status: None   Collection Time: 10/13/19  9:25 AM   Specimen: Nasopharyngeal Swab  Result Value Ref Range Status   SARS Coronavirus 2 NEGATIVE NEGATIVE Final    Comment: (NOTE) SARS-CoV-2 target nucleic acids are NOT DETECTED.  The SARS-CoV-2 RNA is generally detectable in upper and lower respiratory specimens during the acute phase of infection. The lowest concentration of SARS-CoV-2 viral copies this assay can detect is 250 copies / mL. A negative result does not preclude SARS-CoV-2 infection and should not be used as the sole basis for treatment or other patient management decisions.  A negative result may occur with improper specimen collection / handling, submission of specimen other than nasopharyngeal swab, presence of viral mutation(s) within the areas targeted by this  assay, and inadequate number of viral copies (<250 copies / mL). A negative result must be combined with clinical observations, patient history, and epidemiological information.  Fact Sheet for Patients:   StrictlyIdeas.no  Fact Sheet for Healthcare Providers: BankingDealers.co.za  This test is not yet approved or  cleared by the [place] FDA and has been authorized for detection and/or diagnosis of SARS-CoV-2 by FDA under an Emergency Use Authorization (EUA).  This EUA will remain in effect (meaning this test can be used) for the duration of the COVID-19 declaration under Section 564(b)(1) of the Act, 21 U.S.C. section 360bbb-3(b)(1), unless the authorization is terminated or revoked sooner.  Performed at [facility], [place] [address] [place], [place] [address]       Studies:  CT Head Wo Contrast  Result Date: 10/13/2019 CLINICAL DATA:  Headache,  intracranial hemorrhage suspected. EXAM: CT HEAD WITHOUT CONTRAST TECHNIQUE: Contiguous axial images were obtained from the base of the skull through the vertex without intravenous contrast. COMPARISON:  Prior head CT examinations 10/07/2019 and earlier. FINDINGS: Brain: Cerebral volume is normal. There is no acute intracranial hemorrhage. No demarcated cortical infarct. No extra-axial fluid collection. No evidence of intracranial mass. No midline shift. Unchanged small scattered parenchymal calcifications. Redemonstrated partially empty sella turcica. Vascular: No hyperdense vessel. Skull: Normal. Negative for fracture or focal lesion. Sinuses/Orbits: Visualized orbits show no acute finding. Minimal ethmoid sinus mucosal thickening. No significant mastoid effusion. IMPRESSION: No CT evidence of acute intracranial abnormality. Redemonstrated partially empty sella turcica. This is very commonly an incidental finding, but can be associated with idiopathic intracranial hypertension. Clinical  correlation is recommended. Unchanged small scattered nonspecific parenchymal calcifications. Mild ethmoid sinus mucosal thickening at the imaged levels. Electronically Signed   By: Kellie Simmering DO   On: 10/13/2019 11:36   CT Angio Chest PE W and/or Wo Contrast  Result Date: 10/13/2019 CLINICAL DATA:  Hypotension.  Deep venous thrombosis EXAM: CT ANGIOGRAPHY CHEST WITH CONTRAST TECHNIQUE: Multidetector CT imaging of the chest was performed using the standard protocol during bolus administration of intravenous contrast. Multiplanar CT image reconstructions and MIPs were obtained to evaluate the vascular anatomy. CONTRAST:  100 mL Isovue 370 nonionic COMPARISON:  Chest radiograph October 07, 2019 FINDINGS: Cardiovascular: There are multiple pulmonary emboli throughout both lower lobes pulmonary arteries as well as in the right upper lobe pulmonary artery and right middle lobe pulmonary artery. Pulmonary emboli are noted in the distal most aspect of the left main pulmonary artery. There is no pulmonary embolus in the right main pulmonary artery. The right ventricle to left ventricle diameter ratio is 1.0 consistent with a degree of right heart strain. There is no thoracic aortic aneurysm or dissection. Visualized great vessels appear normal. Note that the right innominate and left common carotid arteries arise as a common trunk, an anatomic variant. There is no pericardial effusion or pericardial thickening. Mediastinum/Nodes: Thyroid appears unremarkable. There is no appreciable thoracic adenopathy. No esophageal lesions are evident. Lungs/Pleura: Lungs are clear. No demonstrable pulmonary infarct. No pleural effusions are evident. Upper Abdomen: Visualized upper abdominal structures otherwise appear unremarkable. Musculoskeletal: No blastic or lytic bone lesions. No evident chest wall lesions. Review of the MIP images confirms the above findings. IMPRESSION: 1. Positive for acute PE with CT evidence of right heart  strain (RV/LV Ratio = 1.0) consistent with at least submassive (intermediate risk) PE. The presence of right heart strain has been associated with an increased risk of morbidity and mortality. Pulmonary emboli are seen throughout multiple lower lobe pulmonary artery branches as well as in right middle lobe and right upper lobe branches. Pulmonary embolus arises at the distal most aspect of the left main pulmonary artery. 2.  Lungs are clear. 3.  No evident adenopathy. 4.  Spleen absent. Critical Value/emergent results were called by telephone at the time of interpretation on 10/13/2019 at 11:41 am to provider [facility] , who verbally acknowledged these results. Electronically Signed   By: Lowella Grip III M.D.   On: 10/13/2019 11:42   CT ABDOMEN PELVIS W CONTRAST  Result Date: 10/13/2019 CLINICAL DATA:  Thrombocytopenia with hypotension and right leg tingling. Status post left common iliac thrombectomy and stent placement 5 days ago. EXAM: CT ABDOMEN AND PELVIS WITH CONTRAST TECHNIQUE: Multidetector CT imaging of the abdomen and pelvis was performed using the standard protocol following bolus  administration of intravenous contrast. CONTRAST:  112m OMNIPAQUE IOHEXOL 350 MG/ML SOLN COMPARISON:  10/07/2019 FINDINGS: Lower chest: See CTA chest performed earlier today. Hepatobiliary: No suspicious focal abnormality within the liver parenchyma. There is no evidence for gallstones, gallbladder wall thickening, or pericholecystic fluid. No intrahepatic or extrahepatic biliary dilation. Pancreas: No focal mass lesion. No dilatation of the main duct. No intraparenchymal cyst. No peripancreatic edema. Spleen: Splenectomy with residual splenule. Adrenals/Urinary Tract: No adrenal nodule or mass. Kidneys unremarkable. No evidence for hydroureter. The urinary bladder appears normal for the degree of distention. Stomach/Bowel: Stomach is unremarkable. No gastric wall thickening. No evidence of outlet obstruction.  Duodenum is normally positioned as is the ligament of Treitz. No small bowel wall thickening. No small bowel dilatation. The terminal ileum is normal. The appendix is not visualized, but there is no edema or inflammation in the region of the cecum. No gross colonic mass. No colonic wall thickening. Vascular/Lymphatic: No abdominal aortic aneurysm. Interval left common/external iliac vein stent placement. No evidence for filling defect in the inferior IVC. Left iliac stent appears patent without discernible filling defect. The edema/stranding in the left pelvic sidewall around the iliac vessels is similar to the preprocedure study of 10/07/2019. There is no gastrohepatic or hepatoduodenal ligament lymphadenopathy. No retroperitoneal or mesenteric lymphadenopathy. No pelvic sidewall lymphadenopathy. Reproductive: Unremarkable. Other: No intraperitoneal free fluid. Musculoskeletal: 333 No worrisome lytic or sclerotic osseous abnormality. IMPRESSION: 1. No acute findings in the abdomen or pelvis. 2. Interval left common/external iliac vein stent placement. No evidence for filling defect in the inferior IVC and stent device appears patent. 3. Similar appearance of the edema/stranding in the left pelvic sidewall around the iliac vessels compared to the pre procedure CT venogram. No evidence for new or progressive extraperitoneal hemorrhage. Electronically Signed   By: EMisty StanleyM.D.   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 Limitations: Obesity.  Performing Technologist: ROda CoganRDMS, RVT  Examination Guidelines: A complete evaluation includes B-mode imaging, spectral Doppler, color Doppler, and power Doppler as needed of all accessible portions of each vessel.  Bilateral testing is considered an integral part of a complete examination. Limited examinations for reoccurring indications may be performed as noted.  IVC/Iliac Findings: +----------+------+--------+--------+    IVC    PatentThrombusComments +----------+------+--------+--------+ IVC Prox  patent                 +----------+------+--------+--------+ IVC Mid   patent                 +----------+------+--------+--------+ IVC Distalpatent                 +----------+------+--------+--------+  +-----------------+---------+-----------+---------+-----------+--------+        CIV       RT-PatentRT-ThrombusLT-PatentLT-ThrombusComments +-----------------+---------+-----------+---------+-----------+--------+ Common Iliac Prox patent              patent                      +-----------------+---------+-----------+---------+-----------+--------+ Common Iliac Mid  patent                                          +-----------------+---------+-----------+---------+-----------+--------+  +-------------------------+---------+-----------+---------+-----------+--------+  EIV           RT-PatentRT-ThrombusLT-PatentLT-ThrombusComments +-------------------------+---------+-----------+---------+-----------+--------+ External Iliac Vein Prox  patent              patent                      +-------------------------+---------+-----------+---------+-----------+--------+ External Iliac Vein Mid   patent              patent                      +-------------------------+---------+-----------+---------+-----------+--------+ External Iliac Vein       patent              patent                      Distal                                                                    +-------------------------+---------+-----------+---------+-----------+--------+   Summary: IVC/Iliac: The IVC and iliac veins appear patent without evidence of obvious thrombus.  *See  table(s) above for measurements and observations.  Electronically signed by Deitra Mayo MD on 10/14/2019 at 2:49:58 PM.    Final    ECHOCARDIOGRAM COMPLETE  Result Date: 10/13/2019    ECHOCARDIOGRAM REPORT   Patient Name:   [facility] Date of Exam: 10/13/2019 Medical Rec #:  [id number]                Height:       60.0 in Accession #:    [id number]               Weight:       246.0 lb Date of Birth:  10-31-1975                 BSA:          2.038 m Patient Age:    84 years                 BP:           121/75 mmHg Patient Gender: F                        HR:           77 bpm. Exam Location:  Inpatient Procedure: 2D Echo, Cardiac Doppler and Color Doppler STAT ECHO Indications:    Pulmonary embolus  History:        Patient has no prior history of Echocardiogram examinations.                 Signs/Symptoms:Morbid obesity; Risk Factors:DVT and Diabetes.  Sonographer:    Dustin Flock Referring Phys: [phone] [place]  1. Left ventricular ejection fraction, by estimation, is 60 to 65%. The left ventricle has normal function. The left ventricle has no regional wall motion abnormalities. There is mild concentric left ventricular hypertrophy. Left ventricular diastolic parameters were normal.  2. Right ventricular systolic function is normal. The right ventricular size is normal.  3. The mitral valve is grossly normal. Trivial mitral valve regurgitation. No evidence of mitral stenosis.  4. The aortic valve is grossly normal. Aortic valve regurgitation  is not visualized. No aortic stenosis is present. Conclusion(s)/Recommendation(s): Normal biventricular function without evidence of hemodynamically significant valvular heart disease. FINDINGS  Left Ventricle: Left ventricular ejection fraction, by estimation, is 60 to 65%. The left ventricle has normal function. The left ventricle has no regional wall motion abnormalities. The left ventricular internal cavity size was normal in  size. There is  mild concentric left ventricular hypertrophy. Left ventricular diastolic parameters were normal. Normal left ventricular filling pressure. Right Ventricle: The right ventricular size is normal. No increase in right ventricular wall thickness. Right ventricular systolic function is normal. Left Atrium: Left atrial size was normal in size. Right Atrium: Right atrial size was normal in size. Pericardium: There is no evidence of pericardial effusion. Mitral Valve: The mitral valve is grossly normal. Trivial mitral valve regurgitation. No evidence of mitral valve stenosis. Tricuspid Valve: The tricuspid valve is grossly normal. Tricuspid valve regurgitation is trivial. No evidence of tricuspid stenosis. Aortic Valve: The aortic valve is grossly normal. Aortic valve regurgitation is not visualized. No aortic stenosis is present. Pulmonic Valve: The pulmonic valve was grossly normal. Pulmonic valve regurgitation is not visualized. No evidence of pulmonic stenosis. Aorta: The aortic root is normal in size and structure. Venous: The inferior vena cava was not well visualized. IAS/Shunts: The atrial septum is grossly normal.  LEFT VENTRICLE PLAX 2D LVIDd:         3.70 cm  Diastology LVIDs:         2.60 cm  LV e' lateral:   7.83 cm/s LV PW:         1.20 cm  LV E/e' lateral: 8.6 LV IVS:        1.20 cm  LV e' medial:    5.33 cm/s LVOT diam:     2.30 cm  LV E/e' medial:  12.7 LV SV:         117 LV SV Index:   57 LVOT Area:     4.15 cm  RIGHT VENTRICLE RV Basal diam:  2.60 cm RV S prime:     6.64 cm/s TAPSE (M-mode): 2.4 cm LEFT ATRIUM             Index       RIGHT ATRIUM           Index LA diam:        3.20 cm 1.57 cm/m  RA Area:     12.60 cm LA Vol (A2C):   31.7 ml 15.55 ml/m RA Volume:   27.80 ml  13.64 ml/m LA Vol (A4C):   36.1 ml 17.71 ml/m LA Biplane Vol: 34.8 ml 17.07 ml/m  AORTIC VALVE LVOT Vmax:   122.00 cm/s LVOT Vmean:  87.100 cm/s LVOT VTI:    0.281 m  AORTA Ao Root diam: 2.30 cm MITRAL VALVE                TRICUSPID VALVE MV Area (PHT): 3.99 cm    TR Peak grad:   19.4 mmHg MV Decel Time: 190 msec    TR Vmax:        220.00 cm/s MV E velocity: 67.70 cm/s MV A velocity: 86.10 cm/s  SHUNTS MV E/A ratio:  0.79        Systemic VTI:  0.28 m                            Systemic Diam: 2.30 cm Eleonore Chiquito MD Electronically signed by Eleonore Chiquito  MD Signature Date/Time: 10/13/2019/4:43:46 PM    Final    VAS [place] LOWER EXTREMITY VENOUS (DVT)  Result Date: 10/14/2019  Lower Venous DVTStudy Indications: Pulmonary embolism. Other Indications: She underwent left lower extremity mechanical thrombectomy                    and left iliac vein stenting by Dr. Carlis Abbott on 10/08/2019. Comparison Study: Left lower ext venous 10/07/19. Performing Technologist: Oda Cogan RDMS, RVT  Examination Guidelines: A complete evaluation includes B-mode imaging, spectral Doppler, color Doppler, and power Doppler as needed of all accessible portions of each vessel. Bilateral testing is considered an integral part of a complete examination. Limited examinations for reoccurring indications may be performed as noted. The reflux portion of the exam is performed with the patient in reverse Trendelenburg.  +---------+---------------+---------+-----------+----------+--------------+ RIGHT    CompressibilityPhasicitySpontaneityPropertiesThrombus Aging +---------+---------------+---------+-----------+----------+--------------+ CFV      Full           Yes      Yes                                 +---------+---------------+---------+-----------+----------+--------------+ SFJ      Full                                                        +---------+---------------+---------+-----------+----------+--------------+ FV Prox  Full                                                        +---------+---------------+---------+-----------+----------+--------------+ FV Mid   Full                                                         +---------+---------------+---------+-----------+----------+--------------+ FV DistalFull                                                        +---------+---------------+---------+-----------+----------+--------------+ PFV      Full                                                        +---------+---------------+---------+-----------+----------+--------------+ POP      Full           Yes      Yes                                 +---------+---------------+---------+-----------+----------+--------------+ PTV      Full                                                        +---------+---------------+---------+-----------+----------+--------------+  PERO     Full                                                        +---------+---------------+---------+-----------+----------+--------------+   +---------+---------------+---------+-----------+----------+-------------------+ LEFT     CompressibilityPhasicitySpontaneityPropertiesThrombus Aging      +---------+---------------+---------+-----------+----------+-------------------+ CFV      Full           Yes      Yes                                      +---------+---------------+---------+-----------+----------+-------------------+ SFJ      Full                                                             +---------+---------------+---------+-----------+----------+-------------------+ FV Prox  Full                                                             +---------+---------------+---------+-----------+----------+-------------------+ FV Mid   Partial                                      Chronic             +---------+---------------+---------+-----------+----------+-------------------+ FV DistalPartial                                      Chronic             +---------+---------------+---------+-----------+----------+-------------------+ PFV      Full                                                              +---------+---------------+---------+-----------+----------+-------------------+ POP                                                   bandage in place,                                                         not visualized      +---------+---------------+---------+-----------+----------+-------------------+ PTV      Partial  Age Indeterminate   +---------+---------------+---------+-----------+----------+-------------------+ PERO     None                                         Age Indeterminate   +---------+---------------+---------+-----------+----------+-------------------+     Summary: RIGHT: - There is no evidence of deep vein thrombosis in the lower extremity.  LEFT: - Findings consistent with age indeterminate deep vein thrombosis involving the left peroneal veins, and left posterior tibial veins. - Findings consistent with chronic deep vein thrombosis involving the left femoral vein.  *See table(s) above for measurements and observations. Electronically signed by Deitra Mayo MD on 10/14/2019 at 2:49:46 PM.    Final     Assessment: 44 y.o. female   1.  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  Submassive PE 3.  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  History of mild subarachnoid hemorrhage 5.  Obesity 6. Chronic headaches and dizziness  7. Depression   Plan:  -The patient was started on IVIG 1 mg/kg x 2 doses, dexamethasone 40 mg IV daily x4 days, and received a dose of Nplate 2 mcg/kg.  She also received 2 units of platelets on 10/13/2019.  Platelet count has improved today to over 40,000.  She is no longer actively bleeding.  Recommend for her to continue on dexamethasone 40 mg IV daily x4 days and for her to receive her second dose of IVIG today as  scheduled. -Recommend checking CBC at least twice a day. -Recommend platelet transfusion if platelet count less than 20,000 or active bleeding. 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  Recommend continuation of heparin drip and to initiate Coumadin starting 10/15/2019.  Discussed with the patient and her daughter that she will need to remain inpatient until Coumadin is therapeutic.   Mikey Bussing, NP 10/14/2019  2:57 PM   Addendum  I have seen the patient today. I agree with the assessment and and plan and have edited the notes.   Her plt is better today, mainly from the 2u platelet transfusion yesterday. Will continue dexa today (and 2 more days) and second (lst) dose IVIG today. She usually respond to the above treatment in a few days. Continue close monitoring. I will check with our pharmacy if we can give her first dose Rutuximab in hospital. Will continue f/u. Continue heparin drip, she is tolerating well, if plt continue improving tomorrow, we can probably restart her coumadin. Doppler results reviewed, appreciate Dr. Ainsley Spinner input. Pt needs life long a/c, hopefully we can safely continue if her ITP could be well controlled but it has been very challenge so far.   Truitt Merle  10/14/2019

## 2019-10-14 NOTE — Progress Notes (Signed)
Attempted to call report, nurse will return call. Lynita Lombard RN

## 2019-10-14 NOTE — Progress Notes (Signed)
ANTICOAGULATION CONSULT NOTE  Pharmacy Consult for Heparin Indication: pulmonary embolus  No Known Allergies  Patient Measurements: Height: 5' (152.4 cm) Weight: (!) 111.6 kg (246 lb) IBW/kg (Calculated) : 45.5 Heparin Dosing Weight: 73.3kg  Vital Signs: Temp: 98.2 F (36.8 C) (07/27 0300) Temp Source: Oral (07/27 0300) BP: 97/74 (07/27 0200) Pulse Rate: 69 (07/27 0200)  Labs: Recent Labs    10/13/19 0449 10/13/19 0449 10/13/19 0854 10/13/19 1208 10/13/19 1629 10/14/19 0232  HGB 11.4*   < >  --   --  10.8* 10.0*  HCT 35.2*  --   --   --  34.7* 30.8*  PLT <5*  --   --   --  29* 49*  LABPROT 17.8*  --   --   --   --   --   INR 1.5*  --   --   --   --   --   HEPARINUNFRC  --   --   --   --   --  0.44  CREATININE 0.65  --   --   --   --   --   TROPONINIHS  --   --  5 3  --   --    < > = values in this interval not displayed.    Assessment: Patient is a 44yo female who presents with at least submassive PE. 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 Per heme wait to start heparin until plt>30 w/out bolus. HCT 35.2, Hg 11.4. Takes warfarin 5mg  daily for DVT w/ INR 1.5. Patient had left iliac vein stenting on 7/21. Reports her gums are bleeding today. With plt 29 & subtherapeutic INR as well as new PE, will start heparin with no bolus. Heparin level 0.44 units/ml  PTLC 49 Hg 10.0  Goal of Therapy:  Heparin level 0.3-0.5 units/ml Monitor platelets by anticoagulation protocol: Yes   Plan:  cont heparin at 1250 units/hr Monitor 6h heparin level- use lower goal d/t low plt count Monitor daily CBC, HL, and s/sx of bleeding  Thanks for allowing pharmacy to be a part of this patient's care.  8/21, PharmD Clinical Pharmacist 10/14/2019,3:08 AM

## 2019-10-14 NOTE — Progress Notes (Signed)
Admitted to 6N32. Patient is alert anf oriented, not in any distress, lines intact. Oriented to unit and staff. Will endorse appropriately.

## 2019-10-14 NOTE — Progress Notes (Signed)
ANTICOAGULATION CONSULT NOTE  Pharmacy Consult for Heparin Indication: pulmonary embolus  No Known Allergies  Patient Measurements: Height: 5' (152.4 cm) Weight: (!) 111.6 kg (246 lb) IBW/kg (Calculated) : 45.5 Heparin Dosing Weight: 73.3kg  Vital Signs: Temp: 97.7 F (36.5 C) (07/27 1226) Temp Source: Oral (07/27 1226) BP: 90/44 (07/27 1200) Pulse Rate: 70 (07/27 1200)  Labs: Recent Labs    10/13/19 0449 10/13/19 0449 10/13/19 0854 10/13/19 1208 10/13/19 1629 10/13/19 1629 10/14/19 0232 10/14/19 0928  HGB 11.4*   < >  --   --  10.8*   < > 10.0* 9.9*  HCT 35.2*   < >  --   --  34.7*  --  30.8* 31.0*  PLT <5*   < >  --   --  29*  --  49* 42*  LABPROT 17.8*  --   --   --   --   --   --   --   INR 1.5*  --   --   --   --   --   --   --   HEPARINUNFRC  --   --   --   --   --   --  0.44 0.65  CREATININE 0.65  --   --   --   --   --   --   --   TROPONINIHS  --   --  5 3  --   --   --   --    < > = values in this interval not displayed.    Assessment: Patient is a 44yo female who presents with at least submassive PE. Patient has a history of ITP with Plt now up to 42 (up from <5 s/p nplate). Per heme wait to start heparin until plt>30 w/out bolus. Takes warfarin 5mg  daily for DVT w/ INR 1.5. Patient had left iliac vein stenting on 7/21.   Heparin level trending up to 0.65 on 1250 units/hr, now above goal aiming for lower end of goal. CBC stable overnight. Gum bleeding seems to have resolved.   Goal of Therapy:  Heparin level 0.3-0.5 units/ml Monitor platelets by anticoagulation protocol: Yes   Plan:  Reduce heparin to 1100 units/hr Monitor 6h heparin level- use lower goal d/t low plt count Monitor daily CBC, HL, and s/sx of bleeding  Thanks for allowing pharmacy to be a part of this patient's care.  8/21 PharmD., BCPS Clinical Pharmacist 10/14/2019 2:09 PM

## 2019-10-14 NOTE — Progress Notes (Addendum)
ANTICOAGULATION CONSULT NOTE  Pharmacy Consult for Heparin Indication: pulmonary embolus  No Known Allergies  Patient Measurements: Height: 4\' 9"  (144.8 cm) Weight: (!) 106.7 kg (235 lb 4.8 oz) IBW/kg (Calculated) : 38.6 Heparin Dosing Weight: 73.3 kg  Vital Signs: Temp: 98 F (36.7 C) (07/27 2110) Temp Source: Oral (07/27 2110) BP: 104/61 (07/27 2110) Pulse Rate: 73 (07/27 2110)  Labs: Recent Labs    10/13/19 0449 10/13/19 0854 10/13/19 1208 10/13/19 1629 10/14/19 0232 10/14/19 0232 10/14/19 0928 10/14/19 1559 10/14/19 2120  HGB 11.4*  --   --    < > 10.0*   < > 9.9* 9.7*  --   HCT 35.2*  --   --    < > 30.8*  --  31.0* 30.8*  --   PLT <5*  --   --    < > 49*  --  42* 62*  --   LABPROT 17.8*  --   --   --   --   --   --   --   --   INR 1.5*  --   --   --   --   --   --   --   --   HEPARINUNFRC  --   --   --   --  0.44  --  0.65  --  0.66  CREATININE 0.65  --   --   --   --   --   --   --   --   TROPONINIHS  --  5 3  --   --   --   --   --   --    < > = values in this interval not displayed.    Assessment: Patient is a 45 yr old female that presented with at least submassive PE. Patient has a history of ITP, plt now up to 62 (up from <5 S.P 225 mcg Nplate). Per Hematology, wait to start heparin (no bolus) until plt>30. Pt takes warfarin 5 mg daily for DVT; INR was 1.5 on admission. Patient had left iliac vein stenting on 7/21.   Heparin level ~7 hrs after heparin infusion was reduce to 1100 units/hr was 0.66 units/ml, which is above the goal range for this pt. CBC stable this afternoon. Per RN, no issues with IV or bleeding observed.  7/27 Dopplers:  R: no evidence of DVT L: age-indeterminate DVT involving L peroneal veins, L posterior tibial veins; chronic DVT involving L femoral vein  Goal of Therapy:  Heparin level 0.3-0.5 units/ml (no bolus) Monitor platelets by anticoagulation protocol: Yes   Plan:  Reduce heparin infusion to 950 units/hr Monitor 6 hr  heparin level - use lower goal due to low plt count Monitor daily heparin level, CBC Monitor for signs/symptoms of bleeding F/U restarting warfarin when able  8/27, PharmD, BCPS, [facility] Clinical Pharmacist 10/14/2019 10:20 PM

## 2019-10-14 NOTE — Progress Notes (Signed)
[facility]  Subjective  - no gum bleeding overnight.  Platelet now 49, <5 on admission.     Objective 104/70 72 98.2 F (36.8 C) (Oral) 16 97%  Intake/Output Summary (Last 24 hours) at 10/14/2019 0752 Last data filed at 10/14/2019 0700 Gross per 24 hour  Intake 2041.02 ml  Output --  Net 2041.02 ml    Left leg swelling improved No active gum bleeding  Laboratory Lab Results: Recent Labs    10/13/19 1629 10/14/19 0232  WBC 18.2* 21.0*  HGB 10.8* 10.0*  HCT 34.7* 30.8*  PLT 29* 49*   BMET Recent Labs    10/13/19 0449  NA 138  K 3.4*  CL 105  CO2 24  GLUCOSE 140*  BUN 10  CREATININE 0.65  CALCIUM 9.0    COAG Lab Results  Component Value Date   INR 1.5 (H) 10/13/2019   INR 1.1 10/10/2019   INR 1.1 10/07/2019   No results found for: PTT  Assessment/Planning:  44 year old female status post percutaneous mechanical thrombectomy of left iliofemoral segment and left iliac vein stent for May Thurner last week.  Admitted overnight for platelet count less than 5 with severe thrombocytopenia in the setting of ITP and some gum bleeding.  Her platelet count this morning is 49 and did get some platelet transfusions overnight.  I would favor very aggressive management of her ITP to get her platelet count up and continuing heparin which she seems to be tolerating this morning with no active bleeding on my assessment.  My fear is that if you take her off anticoagulation and place an IVC filter she is going to immediately occlude her left iliac vein stent then occlude her IVC filter and then have no options from a surgical standpoint.  Cephus Shelling 10/14/2019 7:52 AM --

## 2019-10-15 ENCOUNTER — Other Ambulatory Visit: Payer: Self-pay

## 2019-10-15 LAB — CBC
HCT: 31.4 % — ABNORMAL LOW (ref 36.0–46.0)
Hemoglobin: 9.6 g/dL — ABNORMAL LOW (ref 12.0–15.0)
MCH: 27.7 pg (ref 26.0–34.0)
MCHC: 30.6 g/dL (ref 30.0–36.0)
MCV: 90.8 fL (ref 80.0–100.0)
Platelets: 76 10*3/uL — ABNORMAL LOW (ref 150–400)
RBC: 3.46 MIL/uL — ABNORMAL LOW (ref 3.87–5.11)
RDW: 15.2 % (ref 11.5–15.5)
WBC: 20 10*3/uL — ABNORMAL HIGH (ref 4.0–10.5)
nRBC: 3.1 % — ABNORMAL HIGH (ref 0.0–0.2)

## 2019-10-15 LAB — BASIC METABOLIC PANEL
Anion gap: 5 (ref 5–15)
BUN: 17 mg/dL (ref 6–20)
CO2: 21 mmol/L — ABNORMAL LOW (ref 22–32)
Calcium: 8.7 mg/dL — ABNORMAL LOW (ref 8.9–10.3)
Chloride: 106 mmol/L (ref 98–111)
Creatinine, Ser: 0.8 mg/dL (ref 0.44–1.00)
GFR calc Af Amer: >60 mL/min (ref >60)
GFR calc non Af Amer: >60 mL/min (ref >60)
Glucose, Bld: 122 mg/dL — ABNORMAL HIGH (ref 70–99)
Potassium: 4.4 mmol/L (ref 3.5–5.1)
Sodium: 132 mmol/L — ABNORMAL LOW (ref 135–145)

## 2019-10-15 LAB — URINE CULTURE

## 2019-10-15 LAB — HEPARIN LEVEL (UNFRACTIONATED)
Heparin Unfractionated: 0.77 IU/mL — ABNORMAL HIGH (ref 0.30–0.70)
Heparin Unfractionated: 0.78 IU/mL — ABNORMAL HIGH (ref 0.30–0.70)
Heparin Unfractionated: 0.63 IU/mL (ref 0.30–0.70)

## 2019-10-15 LAB — PATHOLOGIST SMEAR REVIEW

## 2019-10-15 LAB — PROTIME-INR
INR: 1.8 — ABNORMAL HIGH (ref 0.8–1.2)
Prothrombin Time: 20.6 seconds — ABNORMAL HIGH (ref 11.4–15.2)

## 2019-10-15 MED ORDER — WARFARIN SODIUM 5 MG PO TABS
5.0000 mg | ORAL_TABLET | Freq: Once | ORAL | Status: AC
  Administered 2019-10-15: 5 mg via ORAL
  Filled 2019-10-15: qty 1

## 2019-10-15 MED ORDER — WARFARIN - PHARMACIST DOSING INPATIENT
Freq: Every day | Status: DC
  Administered 2019-10-19: 1

## 2019-10-15 MED ORDER — SODIUM CHLORIDE 0.9 % IV SOLN
INTRAVENOUS | Status: AC
  Filled 2019-10-15 (×4): qty 25

## 2019-10-15 MED ORDER — DEXAMETHASONE SODIUM PHOSPHATE 10 MG/ML IJ SOLN
40.0000 mg | INTRAMUSCULAR | Status: DC
  Filled 2019-10-15: qty 4

## 2019-10-15 NOTE — Plan of Care (Signed)
  Problem: Activity: Goal: Risk for activity intolerance will decrease Outcome: Progressing   Problem: Nutrition: Goal: Adequate nutrition will be maintained Outcome: Completed/Met   Problem: Coping: Goal: Level of anxiety will decrease Outcome: Progressing   Problem: Pain Managment: Goal: General experience of comfort will improve Outcome: Completed/Met

## 2019-10-15 NOTE — Progress Notes (Signed)
[name]   DOB:06-18-1975   HM#:[id number]   ZMO#:[id number]  Hematology follow up   Subjective: Pt is clinically doing well, tolerating heparin infusion, no bleeding, pain or other new complaints.  Her platelet count continue to improve.  Objective:  Vitals:   10/15/19 0525 10/15/19 1413  BP: (!) 111/62 (!) 110/58  Pulse: 64 64  Resp: 15 17  Temp: 98 F (36.7 C) 97.8 F (36.6 C)  SpO2: 99% 98%    Body mass index is 51.81 kg/m.  Intake/Output Summary (Last 24 hours) at 10/15/2019 1828 Last data filed at 10/15/2019 1518 Gross per 24 hour  Intake 1370.16 ml  Output --  Net 1370.16 ml     Sclerae unicteric  Oropharynx no bleeding noted  No peripheral adenopathy  Lungs clear -- no rales or rhonchi  Heart regular rate and rhythm  Abdomen benign  MSK: No lower extremity edema  CBG (last 3)  No results for input(s): GLUCAP in the last 72 hours.   Labs:  Urine Studies No results for input(s): UHGB, CRYS in the last 72 hours.  Invalid input(s): UACOL, UAPR, USPG, UPH, UTP, UGL, UKET, UBIL, UNIT, UROB, ULEU, UEPI, UWBC, URBC, UBAC, CAST, [place], [place]  Basic Metabolic Panel: Recent Labs  Lab 10/09/19 0242 10/09/19 0242 10/10/19 0303 10/10/19 0303 10/13/19 0449 10/15/19 0544  NA 137  --  137  --  138 132*  K 3.6   < > 3.5   < > 3.4* 4.4  CL 106  --  105  --  105 106  CO2 23  --  25  --  24 21*  GLUCOSE 133*  --  138*  --  140* 122*  BUN 6  --  <5*  --  10 17  CREATININE 0.50  --  0.62  --  0.65 0.80  CALCIUM 8.1*  --  8.1*  --  9.0 8.7*   < > = values in this interval not displayed.   GFR Estimated Creatinine Clearance: 94.4 mL/min (by C-G formula based on SCr of 0.8 mg/dL). Liver Function Tests: Recent Labs  Lab 10/13/19 0449  AST 41  ALT 36  ALKPHOS 55  BILITOT 0.3  PROT 8.0  ALBUMIN 3.3*   No results for input(s): LIPASE, AMYLASE in the last 168 hours. No results for input(s): AMMONIA in the last 168 hours. Coagulation profile Recent  Labs  Lab 10/10/19 0303 10/13/19 0449 10/15/19 0544  INR 1.1 1.5* 1.8*    CBC: Recent Labs  Lab 10/13/19 0449 10/13/19 0449 10/13/19 1629 10/14/19 0232 10/14/19 0928 10/14/19 1559 10/15/19 0544  WBC 13.2*   < > 18.2* 21.0* 25.1* 24.6* 20.0*  NEUTROABS 8.9*  --   --   --   --   --   --   HGB 11.4*   < > 10.8* 10.0* 9.9* 9.7* 9.6*  HCT 35.2*   < > 34.7* 30.8* 31.0* 30.8* 31.4*  MCV 89.3   < > 90.1 88.3 88.8 89.0 90.8  PLT <5*   < > 29* 49* 42* 62* 76*   < > = values in this interval not displayed.   Cardiac Enzymes: No results for input(s): CKTOTAL, CKMB, CKMBINDEX, TROPONINI in the last 168 hours. BNP: Invalid input(s): POCBNP CBG: No results for input(s): GLUCAP in the last 168 hours. D-Dimer No results for input(s): DDIMER in the last 72 hours. Hgb A1c No results for input(s): HGBA1C in the last 72 hours. Lipid Profile No results for input(s): CHOL, HDL, LDLCALC,  TRIG, CHOLHDL, LDLDIRECT in the last 72 hours. Thyroid function studies No results for input(s): TSH, T4TOTAL, T3FREE, THYROIDAB in the last 72 hours.  Invalid input(s): FREET3 Anemia work up No results for input(s): VITAMINB12, FOLATE, FERRITIN, TIBC, IRON, RETICCTPCT in the last 72 hours. Microbiology Recent Results (from the past 240 hour(s))  SARS Coronavirus 2 by RT PCR (hospital order, performed in [facility] hospital lab) Nasopharyngeal Nasopharyngeal Swab     Status: None   Collection Time: 10/07/19  5:30 PM   Specimen: Nasopharyngeal Swab  Result Value Ref Range Status   SARS Coronavirus 2 NEGATIVE NEGATIVE Final    Comment: (NOTE) SARS-CoV-2 target nucleic acids are NOT DETECTED.  The SARS-CoV-2 RNA is generally detectable in upper and lower respiratory specimens during the acute phase of infection. The lowest concentration of SARS-CoV-2 viral copies this assay can detect is 250 copies / mL. A negative result does not preclude SARS-CoV-2 infection and should not be used as the sole basis  for treatment or other patient management decisions.  A negative result may occur with improper specimen collection / handling, submission of specimen other than nasopharyngeal swab, presence of viral mutation(s) within the areas targeted by this assay, and inadequate number of viral copies (<250 copies / mL). A negative result must be combined with clinical observations, patient history, and epidemiological information.  Fact Sheet for Patients:   BoilerBrush.com.cy  Fact Sheet for Healthcare Providers: https://pope.com/  This test is not yet approved or  cleared by the [place] FDA and has been authorized for detection and/or diagnosis of SARS-CoV-2 by FDA under an Emergency Use Authorization (EUA).  This EUA will remain in effect (meaning this test can be used) for the duration of the COVID-19 declaration under Section 564(b)(1) of the Act, 21 U.S.C. section 360bbb-3(b)(1), unless the authorization is terminated or revoked sooner.  Performed at [facility], [address] [place], [place] [address]   SARS Coronavirus 2 by RT PCR (hospital order, performed in [facility] hospital lab) Nasopharyngeal Nasopharyngeal Swab     Status: None   Collection Time: 10/13/19  9:25 AM   Specimen: Nasopharyngeal Swab  Result Value Ref Range Status   SARS Coronavirus 2 NEGATIVE NEGATIVE Final    Comment: (NOTE) SARS-CoV-2 target nucleic acids are NOT DETECTED.  The SARS-CoV-2 RNA is generally detectable in upper and lower respiratory specimens during the acute phase of infection. The lowest concentration of SARS-CoV-2 viral copies this assay can detect is 250 copies / mL. A negative result does not preclude SARS-CoV-2 infection and should not be used as the sole basis for treatment or other patient management decisions.  A negative result may occur with improper specimen collection / handling, submission of specimen other than  nasopharyngeal swab, presence of viral mutation(s) within the areas targeted by this assay, and inadequate number of viral copies (<250 copies / mL). A negative result must be combined with clinical observations, patient history, and epidemiological information.  Fact Sheet for Patients:   BoilerBrush.com.cy  Fact Sheet for Healthcare Providers: https://pope.com/  This test is not yet approved or  cleared by the [place] FDA and has been authorized for detection and/or diagnosis of SARS-CoV-2 by FDA under an Emergency Use Authorization (EUA).  This EUA will remain in effect (meaning this test can be used) for the duration of the COVID-19 declaration under Section 564(b)(1) of the Act, 21 U.S.C. section 360bbb-3(b)(1), unless the authorization is terminated or revoked sooner.  Performed at [facility], [address] [place], [place] [address]   Urine culture     Status: Abnormal   Collection Time: 10/13/19  3:25 PM   Specimen: Urine, Random  Result Value Ref Range Status   Specimen Description URINE, RANDOM  Final   Special Requests   Final    NONE Performed at [facility], 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 Limitations: Obesity.  Performing Technologist: Marilynne Halsted RDMS, RVT  Examination Guidelines: A complete evaluation includes B-mode imaging, spectral Doppler, color Doppler, and power Doppler as needed of all accessible portions of each vessel. Bilateral testing is considered an integral part of a complete  examination. Limited examinations for reoccurring indications may be performed as noted.  IVC/Iliac Findings: +----------+------+--------+--------+    IVC    PatentThrombusComments +----------+------+--------+--------+ IVC Prox  patent                 +----------+------+--------+--------+ IVC Mid   patent                 +----------+------+--------+--------+ IVC Distalpatent                 +----------+------+--------+--------+  +-----------------+---------+-----------+---------+-----------+--------+        CIV       RT-PatentRT-ThrombusLT-PatentLT-ThrombusComments +-----------------+---------+-----------+---------+-----------+--------+ Common Iliac Prox patent              patent                      +-----------------+---------+-----------+---------+-----------+--------+ Common Iliac Mid  patent                                          +-----------------+---------+-----------+---------+-----------+--------+  +-------------------------+---------+-----------+---------+-----------+--------+            EIV           RT-PatentRT-ThrombusLT-PatentLT-ThrombusComments +-------------------------+---------+-----------+---------+-----------+--------+ External Iliac Vein Prox  patent              patent                      +-------------------------+---------+-----------+---------+-----------+--------+ External Iliac Vein Mid   patent              patent                      +-------------------------+---------+-----------+---------+-----------+--------+ External Iliac Vein       patent              patent                      Distal                                                                    +-------------------------+---------+-----------+---------+-----------+--------+  Summary: IVC/Iliac: The IVC and iliac veins appear patent without evidence of obvious thrombus.  *See table(s) above for measurements and observations.  Electronically  signed by Waverly Ferrari MD on 10/14/2019 at 2:49:58 PM.    Final    VAS [place] LOWER EXTREMITY VENOUS (DVT)  Result Date: 10/14/2019  Lower Venous DVTStudy Indications: Pulmonary embolism. Other Indications: She underwent left lower extremity mechanical thrombectomy                    and left iliac vein stenting by Dr. Chestine Spore on 10/08/2019. Comparison Study: Left lower ext venous 10/07/19. Performing Technologist: Marilynne Halsted RDMS, RVT  Examination Guidelines: A complete evaluation includes B-mode imaging, spectral Doppler, color Doppler, and power Doppler as needed of all accessible portions of each vessel. Bilateral testing is considered an integral part of a complete examination. Limited examinations for reoccurring indications may be performed as noted. The reflux portion of the exam is performed with the patient in reverse Trendelenburg.  +---------+---------------+---------+-----------+----------+--------------+ RIGHT    CompressibilityPhasicitySpontaneityPropertiesThrombus Aging +---------+---------------+---------+-----------+----------+--------------+ CFV      Full           Yes      Yes                                 +---------+---------------+---------+-----------+----------+--------------+ SFJ      Full                                                        +---------+---------------+---------+-----------+----------+--------------+ FV Prox  Full                                                        +---------+---------------+---------+-----------+----------+--------------+ FV Mid   Full                                                        +---------+---------------+---------+-----------+----------+--------------+ FV DistalFull                                                        +---------+---------------+---------+-----------+----------+--------------+ PFV      Full                                                         +---------+---------------+---------+-----------+----------+--------------+ POP      Full           Yes      Yes                                 +---------+---------------+---------+-----------+----------+--------------+ PTV  Full                                                        +---------+---------------+---------+-----------+----------+--------------+ PERO     Full                                                        +---------+---------------+---------+-----------+----------+--------------+   +---------+---------------+---------+-----------+----------+-------------------+ LEFT     CompressibilityPhasicitySpontaneityPropertiesThrombus Aging      +---------+---------------+---------+-----------+----------+-------------------+ CFV      Full           Yes      Yes                                      +---------+---------------+---------+-----------+----------+-------------------+ SFJ      Full                                                             +---------+---------------+---------+-----------+----------+-------------------+ FV Prox  Full                                                             +---------+---------------+---------+-----------+----------+-------------------+ FV Mid   Partial                                      Chronic             +---------+---------------+---------+-----------+----------+-------------------+ FV DistalPartial                                      Chronic             +---------+---------------+---------+-----------+----------+-------------------+ PFV      Full                                                             +---------+---------------+---------+-----------+----------+-------------------+ POP                                                   bandage in place,  not visualized       +---------+---------------+---------+-----------+----------+-------------------+ PTV      Partial                                      Age Indeterminate   +---------+---------------+---------+-----------+----------+-------------------+ PERO     None                                         Age Indeterminate   +---------+---------------+---------+-----------+----------+-------------------+     Summary: RIGHT: - There is no evidence of deep vein thrombosis in the lower extremity.  LEFT: - Findings consistent with age indeterminate deep vein thrombosis involving the left peroneal veins, and left posterior tibial veins. - Findings consistent with chronic deep vein thrombosis involving the left femoral vein.  *See table(s) above for measurements and observations. Electronically signed by Waverly Ferrari MD on 10/14/2019 at 2:49:46 PM.    Final     Assessment: 44 y.o. female   1.  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  Submassive bilateral PE  3.  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  History of mild subarachnoid hemorrhage 5.  Obesity 6. Chronic headaches and dizziness  7. Depression   Plan:  -plt continue to improve, pt 76K this morning, no bleeding,H/H stable -plan to restart coumadin today, dose and monitor per pharmacy. This should be safe giving her plt count now  -I checked EMAR that she received dexa  on 7/26 and 7/28, and solu-medro  on 7/26, will give one more dose dexa  oral tomorrow, I will order -plan to start Rutuximab infusion tomorrow for her refractory ITP. She previously received it. The drug has been replaced (she does not have insurance), I spoke with our pharmacy in [facility], and pt's nurse and charge nurse, it will be OK to give it on 6N at [facility] by her floor nurse.  -I again reviewed benefits and  potential side effect from Rituxan, especially infusion reactions, such as chills, fever, dizziness, hypotension etc, and increased risk of infection from immunosuppression.  She voiced good understanding, and agrees to proceed. Plan to give weekly X4, first dose tomorrow. Orders are signed.  -continue watch CBC closely    Malachy Mood, MD 10/15/2019  6:28 PM

## 2019-10-15 NOTE — Progress Notes (Addendum)
ANTICOAGULATION CONSULT NOTE  Pharmacy Consult for Heparin >> Warfarin Indication: pulmonary embolus  No Known Allergies  Patient Measurements: Height: 4\' 9"  (144.8 cm) Weight: (!) 108.6 kg (239 lb 6.4 oz) IBW/kg (Calculated) : 38.6 Heparin Dosing Weight: 73.3 kg  Vital Signs: Temp: 97.8 F (36.6 C) (07/28 1413) Temp Source: Oral (07/28 1413) BP: 110/58 (07/28 1413) Pulse Rate: 64 (07/28 1413)  Labs: Recent Labs    10/13/19 0449 10/13/19 0854 10/13/19 1208 10/13/19 1629 10/14/19 0928 10/14/19 0928 10/14/19 1559 10/14/19 2120 10/15/19 0544 10/15/19 1247 10/15/19 1927  HGB 11.4*  --   --    < > 9.9*   < > 9.7*  --  9.6*  --   --   HCT 35.2*  --   --    < > 31.0*  --  30.8*  --  31.4*  --   --   PLT <5*  --   --    < > 42*  --  62*  --  76*  --   --   LABPROT 17.8*  --   --   --   --   --   --   --  20.6*  --   --   INR 1.5*  --   --   --   --   --   --   --  1.8*  --   --   HEPARINUNFRC  --   --   --    < > 0.65  --   --    < > 0.77* 0.78* 0.63  CREATININE 0.65  --   --   --   --   --   --   --  0.80  --   --   TROPONINIHS  --  5 3  --   --   --   --   --   --   --   --    < > = values in this interval not displayed.    Assessment: Patient is a 44 yr old female that presented with at least submassive PE. Patient has a history of ITP, plt now up to 62 (up from <5 S.P 225 mcg Nplate). Per Hematology, wait to start heparin (no bolus) until plt>30. Pt takes warfarin 5 mg daily for DVT (last dose was on 7/25 at 1300, per med rec); INR was 1.5 on admission. Patient had left iliac vein stenting on 7/21.   7/27 Dopplers:  R: no evidence of DVT L: age-indeterminate DVT involving L peroneal veins, L posterior tibial veins; chronic DVT involving L femoral vein  Heparin level ~6 hrs after heparin infusion was reduced to 550 units/hr was 0.63 units/ml, which is above the goal range for this pt. H/H stable, platelets up to 76 today. Today, pharmacy was consulted to restart  warfarin. Per RN, no issues with IV or bleeding observed. INR is 1.8 today (increased from 1.5 yesterday).   Goal of Therapy:  Heparin level 0.3-0.5 units/ml (no bolus) (lower goal, due to low plt count) INR goal: 2-3 Monitor platelets by anticoagulation protocol: Yes   Plan:  Reduce heparin infusion to 450 units/hr Check heparin level in 6 hrs Warfarin 5 mg PO X 1 this evening - completed Monitor daily heparin level, INR, CBC Monitor for signs/symptoms of bleeding  02-16-1983, PharmD, BCPS, [facility] Clinical Pharmacist 10/15/2019 6:14 PM

## 2019-10-15 NOTE — Progress Notes (Addendum)
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 Pt was initially admitted to critical care service for closer monitorin  Assessment & Plan:   Active Problems:   Thrombocytopenia (HCC)   Acute pulmonary embolism (HCC)   1. ITP with thrombocytopenia 1. Hematology following 2. Pt given IVIG x 2 doses and currently receiving 4 day course of IV dex (day 2 today) 3. Plts are trending up, currently 76k 4. Will repeat cbc in AM 2. DVT/PE 1. Currently on heparin gtt 2. Seen by Vascular Surgery, considered not candidate for IVC filter 3. Have placed coumadin consult as plts are now trending up, per Hematology recommendations 3. Depression 1. Seems to be stable at this time 4. GERD 1. Currently stable 5. Morbid obesity 1. Recommend diet/lifestyle modification 6. Chronic anticoagulation 1. Per above  DVT prophylaxis: Heparin gtt Code Status: Full Family Communication: Pt in room, family at bedside  Status is: Inpatient  Remains inpatient appropriate because:Unsafe d/c plan, IV treatments appropriate due to intensity of illness or inability to take PO and Inpatient level of care appropriate due to severity of illness   Dispo: The patient is from: Home              Anticipated d/c is to: Home              Anticipated d/c date is: > 3 days              Patient currently is not medically stable to d/c.       Consultants:   Critical care  Hematology  Vascular Surgery  Procedures:     Antimicrobials: Anti-infectives (From admission, onward)   None       Subjective: Without complaints this AM  Objective: Vitals:   10/14/19 2110 10/15/19 0139 10/15/19 0525 10/15/19 1413  BP: (!) 104/61 107/65 (!) 111/62 (!) 110/58  Pulse: 73 62 64 64  Resp: Temp: 98 F (36.7 C) 97.8 F (36.6 C) 98 F (36.7 C) 97.8 F (36.6 C)  TempSrc: Oral Oral Oral Oral  SpO2: 99% 100% 99% 98%  Weight:   (!) 108.6 kg   Height:        Intake/Output Summary (Last 24 hours) at 10/15/2019 1813 Last data filed at 10/15/2019 1518 Gross per 24 hour  Intake 1370.16 ml  Output --  Net 1370.16 ml   Filed Weights   10/13/19 0421 10/14/19 1800 10/15/19 0525  Weight: (!) 111.6 kg (!) 106.7 kg (!) 108.6 kg    Examination:  General exam: Appears calm and comfortable  Respiratory system: Clear to auscultation. Respiratory effort normal. Cardiovascular system: S1 & S2 heard, Regular Gastrointestinal system: Abdomen is nondistended, soft and nontender. No organomegaly or masses felt. Normal bowel sounds heard. Central nervous system: Alert and oriented. No focal neurological deficits. Extremities: Symmetric 5 x 5 power. Skin: No rashes, lesions  Psychiatry: Judgement and insight appear normal. Mood & affect appropriate.   Data Reviewed: I have personally reviewed following labs and imaging studies  CBC: Recent Labs  Lab 10/13/19 0449 10/13/19 0449 10/13/19 1629 10/14/19 0232 10/14/19 0928 10/14/19 1559 10/15/19 0544  WBC 13.2*   < > 18.2* 21.0* 25.1* 24.6* 20.0*  NEUTROABS 8.9*  --   --   --   --   --   --  HGB 11.4*   < > 10.8* 10.0* 9.9* 9.7* 9.6*  HCT 35.2*   < > 34.7* 30.8* 31.0* 30.8* 31.4*  MCV 89.3   < > 90.1 88.3 88.8 89.0 90.8  PLT <5*   < > 29* 49* 42* 62* 76*   < > = values in this interval not displayed.   Basic Metabolic Panel: Recent Labs  Lab 10/09/19 0242 10/10/19 0303 10/13/19 0449 10/15/19 0544  NA 137 137 138 132*  K 3.6 3.5 3.4* 4.4  CL 106 105 105 106  CO2 23 25 24  21*  GLUCOSE 133* 138* 140* 122*  BUN 6 <5* 10 17  CREATININE 0.50 0.62 0.65 0.80  CALCIUM 8.1* 8.1* 9.0 8.7*   GFR: Estimated Creatinine Clearance: 94.4 mL/min (by C-G formula based on SCr of 0.8 mg/dL). Liver Function Tests: Recent Labs  Lab  10/13/19 0449  AST 41  ALT 36  ALKPHOS 55  BILITOT 0.3  PROT 8.0  ALBUMIN 3.3*   No results for input(s): LIPASE, AMYLASE in the last 168 hours. No results for input(s): AMMONIA in the last 168 hours. Coagulation Profile: Recent Labs  Lab 10/10/19 0303 10/13/19 0449 10/15/19 0544  INR 1.1 1.5* 1.8*   Cardiac Enzymes: No results for input(s): CKTOTAL, CKMB, CKMBINDEX, TROPONINI in the last 168 hours. BNP (last 3 results) No results for input(s): PROBNP in the last 8760 hours. HbA1C: No results for input(s): HGBA1C in the last 72 hours. CBG: No results for input(s): GLUCAP in the last 168 hours. Lipid Profile: No results for input(s): CHOL, HDL, LDLCALC, TRIG, CHOLHDL, LDLDIRECT in the last 72 hours. Thyroid Function Tests: No results for input(s): TSH, T4TOTAL, FREET4, T3FREE, THYROIDAB in the last 72 hours. Anemia Panel: No results for input(s): VITAMINB12, FOLATE, FERRITIN, TIBC, IRON, RETICCTPCT in the last 72 hours. Sepsis Labs: No results for input(s): PROCALCITON, LATICACIDVEN in the last 168 hours.  Recent Results (from the past 240 hour(s))  SARS Coronavirus 2 by RT PCR (hospital order, performed in [facility] hospital lab) Nasopharyngeal Nasopharyngeal Swab     Status: None   Collection Time: 10/07/19  5:30 PM   Specimen: Nasopharyngeal Swab  Result Value Ref Range Status   SARS Coronavirus 2 NEGATIVE NEGATIVE Final    Comment: (NOTE) SARS-CoV-2 target nucleic acids are NOT DETECTED.  The SARS-CoV-2 RNA is generally detectable in upper and lower respiratory specimens during the acute phase of infection. The lowest concentration of SARS-CoV-2 viral copies this assay can detect is 250 copies / mL. A negative result does not preclude SARS-CoV-2 infection and should not be used as the sole basis for treatment or other patient management decisions.  A negative result may occur with improper specimen collection / handling, submission of specimen other than  nasopharyngeal swab, presence of viral mutation(s) within the areas targeted by this assay, and inadequate number of viral copies (<250 copies / mL). A negative result must be combined with clinical observations, patient history, and epidemiological information.  Fact Sheet for Patients:   10/09/19  Fact Sheet for Healthcare Providers: BoilerBrush.com.cy  This test is not yet approved or  cleared by the https://pope.com/ FDA and has been authorized for detection and/or diagnosis of SARS-CoV-2 by FDA under an Emergency Use Authorization (EUA).  This EUA will remain in effect (meaning this test can be used) for the duration of the COVID-19 declaration under Section 564(b)(1) of the Act, 21 U.S.C. section 360bbb-3(b)(1), unless the authorization is terminated or revoked sooner.  Performed at [facility]  [facility], [address], [place], [place] [address]   SARS Coronavirus 2 by RT PCR (hospital order, performed in [facility] hospital lab) Nasopharyngeal Nasopharyngeal Swab     Status: None   Collection Time: 10/13/19  9:25 AM   Specimen: Nasopharyngeal Swab  Result Value Ref Range Status   SARS Coronavirus 2 NEGATIVE NEGATIVE Final    Comment: (NOTE) SARS-CoV-2 target nucleic acids are NOT DETECTED.  The SARS-CoV-2 RNA is generally detectable in upper and lower respiratory specimens during the acute phase of infection. The lowest concentration of SARS-CoV-2 viral copies this assay can detect is 250 copies / mL. A negative result does not preclude SARS-CoV-2 infection and should not be used as the sole basis for treatment or other patient management decisions.  A negative result may occur with improper specimen collection / handling, submission of specimen other than nasopharyngeal swab, presence of viral mutation(s) within the areas targeted by this assay, and inadequate number of viral copies (<250 copies / mL). A negative  result must be combined with clinical observations, patient history, and epidemiological information.  Fact Sheet for Patients:   BoilerBrush.com.cy  Fact Sheet for Healthcare Providers: https://pope.com/  This test is not yet approved or  cleared by the [place] FDA and has been authorized for detection and/or diagnosis of SARS-CoV-2 by FDA under an Emergency Use Authorization (EUA).  This EUA will remain in effect (meaning this test can be used) for the duration of the COVID-19 declaration under Section 564(b)(1) of the Act, 21 U.S.C. section 360bbb-3(b)(1), unless the authorization is terminated or revoked sooner.  Performed at [facility], [address], [place], [place] [address]   Urine culture     Status: Abnormal   Collection Time: 10/13/19  3:25 PM   Specimen: Urine, Random  Result Value Ref Range Status   Specimen Description URINE, RANDOM  Final   Special Requests   Final    NONE Performed at [facility], [address], [place], [place] [address]    Culture MULTIPLE SPECIES PRESENT, SUGGEST RECOLLECTION (A)  Final   Report Status 10/15/2019 FINAL  Final     Radiology Studies: VAS [place] IVC/ILIAC (VENOUS ONLY)  Result Date: 10/14/2019 IVC/ILIAC STUDY Indications: acute PE with right heart strain Other Factors: History of chronic ITP with left lower extremity DVT. 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 Limitations: Obesity.  Performing Technologist: Marilynne Halsted RDMS, RVT  Examination Guidelines: A complete evaluation includes B-mode imaging, spectral Doppler, color Doppler, and power Doppler as needed of all accessible portions of each vessel. Bilateral testing is considered an integral part of a complete examination. Limited examinations for reoccurring indications may be performed as noted.  IVC/Iliac Findings: +----------+------+--------+--------+    IVC     PatentThrombusComments +----------+------+--------+--------+ IVC Prox  patent                 +----------+------+--------+--------+ IVC Mid   patent                 +----------+------+--------+--------+ IVC Distalpatent                 +----------+------+--------+--------+  +-----------------+---------+-----------+---------+-----------+--------+        CIV       RT-PatentRT-ThrombusLT-PatentLT-ThrombusComments +-----------------+---------+-----------+---------+-----------+--------+ Common Iliac Prox patent              patent                      +-----------------+---------+-----------+---------+-----------+--------+  Common Iliac Mid  patent                                          +-----------------+---------+-----------+---------+-----------+--------+  +-------------------------+---------+-----------+---------+-----------+--------+            EIV           RT-PatentRT-ThrombusLT-PatentLT-ThrombusComments +-------------------------+---------+-----------+---------+-----------+--------+ External Iliac Vein Prox  patent              patent                      +-------------------------+---------+-----------+---------+-----------+--------+ External Iliac Vein Mid   patent              patent                      +-------------------------+---------+-----------+---------+-----------+--------+ External Iliac Vein       patent              patent                      Distal                                                                    +-------------------------+---------+-----------+---------+-----------+--------+   Summary: IVC/Iliac: The IVC and iliac veins appear patent without evidence of obvious thrombus.  *See table(s) above for measurements and observations.  Electronically signed by Waverly Ferrarihristopher Dickson MD on 10/14/2019 at 2:49:58 PM.    Final    VAS US LOWER EXTREMITY VENOUS (DVT)  Result Date: 10/14/2019  Lower Venous DVTStudy  Indications: Pulmonary embolism. Other Indications: She underwent left lower extremity mechanical thrombectomy                    and left iliac vein stenting by Dr. Chestine Sporelark on 10/08/2019. Comparison Study: Left lower ext venous 10/07/19. Performing Technologist: Marilynne Halstedita Sturdivant RDMS, RVT  Examination Guidelines: A complete evaluation includes B-mode imaging, spectral Doppler, color Doppler, and power Doppler as needed of all accessible portions of each vessel. Bilateral testing is considered an integral part of a complete examination. Limited examinations for reoccurring indications may be performed as noted. The reflux portion of the exam is performed with the patient in reverse Trendelenburg.  +---------+---------------+---------+-----------+----------+--------------+ RIGHT    CompressibilityPhasicitySpontaneityPropertiesThrombus Aging +---------+---------------+---------+-----------+----------+--------------+ CFV      Full           Yes      Yes                                 +---------+---------------+---------+-----------+----------+--------------+ SFJ      Full                                                        +---------+---------------+---------+-----------+----------+--------------+ FV Prox  Full                                                        +---------+---------------+---------+-----------+----------+--------------+  FV Mid   Full                                                        +---------+---------------+---------+-----------+----------+--------------+ FV DistalFull                                                        +---------+---------------+---------+-----------+----------+--------------+ PFV      Full                                                        +---------+---------------+---------+-----------+----------+--------------+ POP      Full           Yes      Yes                                  +---------+---------------+---------+-----------+----------+--------------+ PTV      Full                                                        +---------+---------------+---------+-----------+----------+--------------+ PERO     Full                                                        +---------+---------------+---------+-----------+----------+--------------+   +---------+---------------+---------+-----------+----------+-------------------+ LEFT     CompressibilityPhasicitySpontaneityPropertiesThrombus Aging      +---------+---------------+---------+-----------+----------+-------------------+ CFV      Full           Yes      Yes                                      +---------+---------------+---------+-----------+----------+-------------------+ SFJ      Full                                                             +---------+---------------+---------+-----------+----------+-------------------+ FV Prox  Full                                                             +---------+---------------+---------+-----------+----------+-------------------+ FV Mid   Partial  Chronic             +---------+---------------+---------+-----------+----------+-------------------+ FV DistalPartial                                      Chronic             +---------+---------------+---------+-----------+----------+-------------------+ PFV      Full                                                             +---------+---------------+---------+-----------+----------+-------------------+ POP                                                   bandage in place,                                                         not visualized      +---------+---------------+---------+-----------+----------+-------------------+ PTV      Partial                                      Age Indeterminate    +---------+---------------+---------+-----------+----------+-------------------+ PERO     None                                         Age Indeterminate   +---------+---------------+---------+-----------+----------+-------------------+     Summary: RIGHT: - There is no evidence of deep vein thrombosis in the lower extremity.  LEFT: - Findings consistent with age indeterminate deep vein thrombosis involving the left peroneal veins, and left posterior tibial veins. - Findings consistent with chronic deep vein thrombosis involving the left femoral vein.  *See table(s) above for measurements and observations. Electronically signed by Waverly Ferrari MD on 10/14/2019 at 2:49:46 PM.    Final     Scheduled Meds: . Chlorhexidine Gluconate Cloth  6 each Topical Daily  . pantoprazole  40 mg Oral Daily  . sodium chloride flush  3 mL Intravenous Once   Continuous Infusions: . heparin 550 Units/hr (10/15/19 1330)  . sodium chloride 0.9 % 25 mL with dexamethasone (DECADRON) 40 mg infusion 50 mL/hr at 10/15/19 1139     LOS: 2 days   Rickey Barbara, MD [facility] Pager On Amion  If 7PM-7AM, please contact night-coverage 10/15/2019, 6:13 PM

## 2019-10-15 NOTE — Progress Notes (Addendum)
  Progress Note    10/15/2019 7:49 AM  Subjective:  No complaints this morning   Vitals:   10/15/19 0139 10/15/19 0525  BP: 107/65 (!) 111/62  Pulse: 62 64  Resp: 17 15  Temp: 97.8 F (36.6 C) 98 F (36.7 C)  SpO2: 100% 99%   Physical Exam: Lungs:  Non labored  Extremities: no significant edema L c ompared to RLE Abdomen:  Soft, NT, ND Neurologic: A&O  CBC    Component Value Date/Time   WBC 20.0 (H) 10/15/2019 0544   RBC 3.46 (L) 10/15/2019 0544   HGB 9.6 (L) 10/15/2019 0544   HGB 12.8 10/01/2019 0838   HCT 31.4 (L) 10/15/2019 0544   HCT 27.3 (L) 09/21/2015 0900   PLT 76 (L) 10/15/2019 0544   PLT 168 10/01/2019 0838   MCV 90.8 10/15/2019 0544   MCH 27.7 10/15/2019 0544   MCHC 30.6 10/15/2019 0544   RDW 15.2 10/15/2019 0544   LYMPHSABS 2.7 10/13/2019 0449   MONOABS 0.8 10/13/2019 0449   EOSABS 0.5 10/13/2019 0449   BASOSABS 0.1 10/13/2019 0449    BMET    Component Value Date/Time   NA 132 (L) 10/15/2019 0544   K 4.4 10/15/2019 0544   CL 106 10/15/2019 0544   CO2 21 (L) 10/15/2019 0544   GLUCOSE 122 (H) 10/15/2019 0544   BUN 17 10/15/2019 0544   CREATININE 0.80 10/15/2019 0544   CREATININE 0.84 08/11/2019 1329   CALCIUM 8.7 (L) 10/15/2019 0544   GFRNONAA >60 10/15/2019 0544   GFRNONAA >60 08/11/2019 1329   GFRAA >60 10/15/2019 0544   GFRAA >60 08/11/2019 1329    INR    Component Value Date/Time   INR 1.8 (H) 10/15/2019 0544     Intake/Output Summary (Last 24 hours) at 10/15/2019 0749 Last data filed at 10/15/2019 0700 Gross per 24 hour  Intake 1244.6 ml  Output --  Net 1244.6 ml     Assessment/Plan:  44 y.o. female is s/p mechanical thrombectomy and L iliac vein stent  Patient tolerating heparin; no further gum bleeding; Hgb remains stable Platelet count up to 79 from 49 High risk to occlude iliac stent and IVC filter if anticoagulation is stopped and filter is placed    Emilie Rutter, PA-C [facility] [phone] 10/15/2019 7:49 AM  I have seen and evaluated the patient. I agree with the PA note as documented above. Hgb stable, platelets improving, tolerating heparin with no bleeding overnight.  Would try to avoid IVC filter as previously noted.  Cephus Shelling, MD [facility] [place] Office: [phone]

## 2019-10-15 NOTE — Progress Notes (Signed)
Called placed to pharmacy regarding missing dose of IV Decadron. Informed it was dispensed a long time ago. Medication not at tube stations nor med rooms. Pharmacy will re-dispense.

## 2019-10-15 NOTE — Progress Notes (Signed)
ANTICOAGULATION CONSULT NOTE  Pharmacy Consult for Heparin Indication: pulmonary embolus  Assessment: Patient is a 44 yr old female that presented with at least submassive PE. Patient has a history of ITP, plt now up to 62 (up from <5 S.P 225 mcg Nplate). Per Hematology, wait to start heparin (no bolus) until plt>30. Pt takes warfarin 5 mg daily for DVT; INR was 1.5 on admission. Patient had left iliac vein stenting on 7/21.   INR up to 1.8 this AM Heparin level still elevated this PM CBC slowly improving  Goal of Therapy:  Heparin level 0.3-0.5 units/ml (no bolus) Monitor platelets by anticoagulation protocol: Yes   Plan:  Reduce heparin infusion to 550 units/hr Monitor 6 hr heparin level - use lower goal due to low plt count Monitor daily heparin level, CBC Monitor for signs/symptoms of bleeding  Thanks for allowing pharmacy to be a part of this patient's care.  [name], PharmD 10/15/2019 1:27 PM

## 2019-10-15 NOTE — Progress Notes (Signed)
ANTICOAGULATION CONSULT NOTE  Pharmacy Consult for Heparin Indication: pulmonary embolus  Assessment: Patient is a 43 yr old female that presented with at least submassive PE. Patient has a history of ITP, plt now up to 62 (up from <5 S.P 225 mcg Nplate). Per Hematology, wait to start heparin (no bolus) until plt>30. Pt takes warfarin 5 mg daily for DVT; INR was 1.5 on admission. Patient had left iliac vein stenting on 7/21.   Heparin level this am 0.77 units/ml, elevated despite rate decerase last night.  CBC pending  Goal of Therapy:  Heparin level 0.3-0.5 units/ml (no bolus) Monitor platelets by anticoagulation protocol: Yes   Plan:  Reduce heparin infusion to 750 units/hr Monitor 6 hr heparin level - use lower goal due to low plt count Monitor daily heparin level, CBC Monitor for signs/symptoms of bleeding  Thanks for allowing pharmacy to be a part of this patient's care.  Talbert Cage, PharmD Clinical Pharmacist 10/15/2019 6:22 AM

## 2019-10-16 ENCOUNTER — Other Ambulatory Visit: Payer: Self-pay

## 2019-10-16 DIAGNOSIS — I2692 Saddle embolus of pulmonary artery without acute cor pulmonale: Secondary | ICD-10-CM

## 2019-10-16 LAB — CBC
HCT: 32.5 % — ABNORMAL LOW (ref 36.0–46.0)
Hemoglobin: 10.5 g/dL — ABNORMAL LOW (ref 12.0–15.0)
MCH: 28.8 pg (ref 26.0–34.0)
MCHC: 32.3 g/dL (ref 30.0–36.0)
MCV: 89 fL (ref 80.0–100.0)
Platelets: 77 10*3/uL — ABNORMAL LOW (ref 150–400)
RBC: 3.65 MIL/uL — ABNORMAL LOW (ref 3.87–5.11)
RDW: 15.3 % (ref 11.5–15.5)
WBC: 21.7 10*3/uL — ABNORMAL HIGH (ref 4.0–10.5)
nRBC: 2.3 % — ABNORMAL HIGH (ref 0.0–0.2)

## 2019-10-16 LAB — HEPARIN LEVEL (UNFRACTIONATED)
Heparin Unfractionated: 0.5 IU/mL (ref 0.30–0.70)
Heparin Unfractionated: 0.5 IU/mL (ref 0.30–0.70)

## 2019-10-16 LAB — PROTIME-INR
INR: 1.4 — ABNORMAL HIGH (ref 0.8–1.2)
Prothrombin Time: 16.8 seconds — ABNORMAL HIGH (ref 11.4–15.2)

## 2019-10-16 LAB — HEPATITIS B CORE ANTIBODY, TOTAL: Hep B Core Total Ab: REACTIVE — AB

## 2019-10-16 LAB — HEPATITIS B SURFACE ANTIGEN: Hepatitis B Surface Ag: NONREACTIVE

## 2019-10-16 MED ORDER — ZOLPIDEM TARTRATE 5 MG PO TABS
5.0000 mg | ORAL_TABLET | Freq: Every evening | ORAL | Status: DC | PRN
  Administered 2019-10-17 – 2019-10-19 (×3): 5 mg via ORAL
  Filled 2019-10-16 (×3): qty 1

## 2019-10-16 MED ORDER — ALBUTEROL SULFATE (2.5 MG/3ML) 0.083% IN NEBU: 2.5000 mg | INHALATION_SOLUTION | Freq: Once | RESPIRATORY_TRACT | Status: DC | PRN

## 2019-10-16 MED ORDER — HEPARIN SOD (PORK) LOCK FLUSH 100 UNIT/ML IV SOLN: 250.0000 [IU] | Freq: Once | INTRAVENOUS | Status: DC | PRN

## 2019-10-16 MED ORDER — FAMOTIDINE IN NACL 20-0.9 MG/50ML-% IV SOLN
20.0000 mg | Freq: Once | INTRAVENOUS | Status: AC
  Administered 2019-10-16: 20 mg via INTRAVENOUS
  Filled 2019-10-16: qty 50

## 2019-10-16 MED ORDER — SODIUM CHLORIDE 0.9 % IV SOLN: Freq: Once | INTRAVENOUS | Status: DC | PRN

## 2019-10-16 MED ORDER — ALTEPLASE 2 MG IJ SOLR: 2.0000 mg | Freq: Once | INTRAMUSCULAR | Status: DC | PRN

## 2019-10-16 MED ORDER — ACETAMINOPHEN 325 MG PO TABS
650.0000 mg | ORAL_TABLET | Freq: Once | ORAL | Status: AC
  Administered 2019-10-16: 650 mg via ORAL
  Filled 2019-10-16: qty 2

## 2019-10-16 MED ORDER — SODIUM CHLORIDE 0.9% FLUSH: 3.0000 mL | INTRAVENOUS | Status: DC | PRN

## 2019-10-16 MED ORDER — SODIUM CHLORIDE 0.9% FLUSH: 10.0000 mL | INTRAVENOUS | Status: DC | PRN

## 2019-10-16 MED ORDER — METHYLPREDNISOLONE SODIUM SUCC 125 MG IJ SOLR: 125.0000 mg | Freq: Once | INTRAMUSCULAR | Status: DC | PRN

## 2019-10-16 MED ORDER — DIPHENHYDRAMINE HCL 50 MG/ML IJ SOLN: 50.0000 mg | Freq: Once | INTRAMUSCULAR | Status: DC | PRN

## 2019-10-16 MED ORDER — WARFARIN SODIUM 5 MG PO TABS
5.0000 mg | ORAL_TABLET | Freq: Once | ORAL | Status: AC
  Administered 2019-10-16: 5 mg via ORAL
  Filled 2019-10-16: qty 1

## 2019-10-16 MED ORDER — DIPHENHYDRAMINE HCL 25 MG PO CAPS
50.0000 mg | ORAL_CAPSULE | Freq: Once | ORAL | Status: AC
  Administered 2019-10-16: 50 mg via ORAL
  Filled 2019-10-16: qty 2

## 2019-10-16 MED ORDER — SODIUM CHLORIDE 0.9 % IV SOLN
20.0000 mg | Freq: Once | INTRAVENOUS | Status: DC
  Filled 2019-10-16: qty 2

## 2019-10-16 MED ORDER — FAMOTIDINE IN NACL 20-0.9 MG/50ML-% IV SOLN: 20.0000 mg | Freq: Once | INTRAVENOUS | Status: DC | PRN

## 2019-10-16 MED ORDER — EPINEPHRINE PF 1 MG/ML IJ SOLN: 0.3000 mg | Freq: Once | INTRAMUSCULAR | Status: DC | PRN

## 2019-10-16 MED ORDER — SODIUM CHLORIDE 0.9 % IV SOLN: Freq: Once | INTRAVENOUS | Status: AC

## 2019-10-16 MED ORDER — SODIUM CHLORIDE 0.9 % IV SOLN
375.0000 mg/m2 | Freq: Once | INTRAVENOUS | Status: AC
  Administered 2019-10-16: 800 mg via INTRAVENOUS
  Filled 2019-10-16 (×2): qty 80

## 2019-10-16 MED ORDER — EPINEPHRINE 0.3 MG/0.3ML IJ SOAJ
0.3000 mg | Freq: Once | INTRAMUSCULAR | Status: DC | PRN
  Filled 2019-10-16: qty 0.6

## 2019-10-16 MED ORDER — HEPARIN SOD (PORK) LOCK FLUSH 100 UNIT/ML IV SOLN: 500.0000 [IU] | Freq: Once | INTRAVENOUS | Status: DC | PRN

## 2019-10-16 NOTE — Progress Notes (Signed)
[facility] [place]  Subjective  - tolerating heparin, no bleeding, started on coumadin.   Objective (!) 106/62 63 98 F (36.7 C) (Oral) 18 96%  Intake/Output Summary (Last 24 hours) at 10/16/2019 0842 Last data filed at 10/16/2019 0600 Gross per 24 hour  Intake 797.33 ml  Output --  Net 797.33 ml    Left leg looks good, swelling improved.  Laboratory Lab Results: Recent Labs    10/15/19 0544 10/16/19 0506  WBC 20.0* 21.7*  HGB 9.6* 10.5*  HCT 31.4* 32.5*  PLT 76* 77*   BMET Recent Labs    10/15/19 0544  NA 132*  K 4.4  CL 106  CO2 21*  GLUCOSE 122*  BUN 17  CREATININE 0.80  CALCIUM 8.7*    COAG Lab Results  Component Value Date   INR 1.4 (H) 10/16/2019   INR 1.8 (H) 10/15/2019   INR 1.5 (H) 10/13/2019   No results found for: PTT  Assessment/Planning:  44 year old female that recently underwent left venous intervention for May Thurner with left iliac vein stent after percutaneous mechanical thrombectomy. Admitted for thrombocytopenia and bleeding gums. Seems to be tolerating heparin and platelet count improving and hemoglobin stable. Coumadin has been started. Pleased with her progress and again hoping to avoid an IVC filter and continuing anticoagulation to give her the best chance of keeping her stent patent.  Cephus Shelling 10/16/2019 8:42 AM --

## 2019-10-16 NOTE — Progress Notes (Signed)
ANTICOAGULATION CONSULT NOTE  Pharmacy Consult for Heparin >> Warfarin Indication: pulmonary embolus  No Known Allergies  Patient Measurements: Height: 4\' 9"  (144.8 cm) Weight: (!) 108.6 kg (239 lb 6.4 oz) IBW/kg (Calculated) : 38.6 Heparin Dosing Weight: 73.3 kg  Vital Signs: Temp: 98 F (36.7 C) (07/29 0554) Temp Source: Oral (07/29 0554) BP: 106/62 (07/29 0554) Pulse Rate: 63 (07/29 0554)  Labs: Recent Labs    10/13/19 1208 10/13/19 1629 10/14/19 1559 10/14/19 2120 10/15/19 0544 10/15/19 0544 10/15/19 1247 10/15/19 1927 10/16/19 0506  HGB  --    < > 9.7*  --  9.6*  --   --   --  10.5*  HCT  --    < > 30.8*  --  31.4*  --   --   --  32.5*  PLT  --    < > 62*  --  76*  --   --   --  77*  LABPROT  --   --   --   --  20.6*  --   --   --  16.8*  INR  --   --   --   --  1.8*  --   --   --  1.4*  HEPARINUNFRC  --    < >  --    < > 0.77*   < > 0.78* 0.63 0.50  CREATININE  --   --   --   --  0.80  --   --   --   --   TROPONINIHS 3  --   --   --   --   --   --   --   --    < > = values in this interval not displayed.    Assessment: Patient is a 44 yr old female that presented with at least submassive PE. Patient has a history of ITP, plt now up to 62 (up from <5 S.P 225 mcg Nplate). Per Hematology, wait to start heparin (no bolus) until plt>30. Pt takes warfarin 5 mg daily for DVT (last dose was on 7/25 at 1300, per med rec); INR was 1.5 on admission. Patient had left iliac vein stenting on 7/21.   7/27 Dopplers:  R: no evidence of DVT L: age-indeterminate DVT involving L peroneal veins, L posterior tibial veins; chronic DVT involving L femoral vein  Pharmacy was consulted on 7/28 to restart warfarin. Today, H/H stable, platelets up to 77. INR is 1.4 (down from 1.8 yesterday). HL therapeutic this AM, however 1200 HL still pending.   Goal of Therapy:  Heparin level 0.3-0.5 units/ml (no bolus) (lower goal, due to low plt count) INR goal: 2-3 Monitor platelets by  anticoagulation protocol: Yes   Plan:  Cont heparin 450 units/hr F/u 1200 HL Warfarin 5 mg PO x1 again this evening. Monitor daily heparin level, INR, CBC Monitor for signs/symptoms of bleeding  8/28, PharmD PGY1 Acute Care Pharmacy Resident 10/16/2019 10:35 AM  Please check [email or web address] for unit-specific pharmacy phone numbers.

## 2019-10-16 NOTE — Progress Notes (Signed)
ANTICOAGULATION CONSULT NOTE  Pharmacy Consult for Heparin >> Warfarin Indication: pulmonary embolus  No Known Allergies  Patient Measurements: Height: 4\' 9"  (144.8 cm) Weight: (!) 108.6 kg (239 lb 6.4 oz) IBW/kg (Calculated) : 38.6 Heparin Dosing Weight: 73.3 kg  Vital Signs: Temp: 98 F (36.7 C) (07/29 0554) Temp Source: Oral (07/29 0554) BP: 106/62 (07/29 0554) Pulse Rate: 63 (07/29 0554)  Labs: Recent Labs    10/13/19 0854 10/13/19 1208 10/13/19 1629 10/14/19 1559 10/14/19 2120 10/15/19 0544 10/15/19 0544 10/15/19 1247 10/15/19 1927 10/16/19 0506  HGB  --   --    < > 9.7*  --  9.6*  --   --   --  10.5*  HCT  --   --    < > 30.8*  --  31.4*  --   --   --  32.5*  PLT  --   --    < > 62*  --  76*  --   --   --  77*  LABPROT  --   --   --   --   --  20.6*  --   --   --  16.8*  INR  --   --   --   --   --  1.8*  --   --   --  1.4*  HEPARINUNFRC  --   --    < >  --    < > 0.77*   < > 0.78* 0.63 0.50  CREATININE  --   --   --   --   --  0.80  --   --   --   --   TROPONINIHS 5 3  --   --   --   --   --   --   --   --    < > = values in this interval not displayed.    Assessment: Patient is a 44 yr old female that presented with at least submassive PE. Patient has a history of ITP, plt now up to 62 (up from <5 S.P 225 mcg Nplate). Per Hematology, wait to start heparin (no bolus) until plt>30. Pt takes warfarin 5 mg daily for DVT (last dose was on 7/25 at 1300, per med rec); INR was 1.5 on admission. Patient had left iliac vein stenting on 7/21.   7/27 Dopplers:  R: no evidence of DVT L: age-indeterminate DVT involving L peroneal veins, L posterior tibial veins; chronic DVT involving L femoral vein  Heparin level ~6 hrs after heparin infusion was reduced to 550 units/hr was 0.63 units/ml, which is above the goal range for this pt. H/H stable, platelets up to 76 today. Today, pharmacy was consulted to restart warfarin. Per RN, no issues with IV or bleeding observed. INR is  1.8 today (increased from 1.5 yesterday).   7/29 AM update:  Heparin level therapeutic INR 1.4  Goal of Therapy:  Heparin level 0.3-0.5 units/ml (no bolus) (lower goal, due to low plt count) INR goal: 2-3 Monitor platelets by anticoagulation protocol: Yes   Plan:  Cont heparin 450 units/hr 1200 heparin level  8/29, PharmD, BCPS Clinical Pharmacist Phone: [phone]

## 2019-10-16 NOTE — Plan of Care (Signed)
  Problem: Education: Goal: Knowledge of General Education information will improve Description: Including pain rating scale, medication(s)/side effects and non-pharmacologic comfort measures Outcome: Progressing   Problem: Health Behavior/Discharge Planning: Goal: Ability to manage health-related needs will improve Outcome: Progressing   Problem: Clinical Measurements: Goal: Ability to maintain clinical measurements within normal limits will improve Outcome: Progressing Goal: Respiratory complications will improve Outcome: Progressing Goal: Cardiovascular complication will be avoided Outcome: Progressing   Problem: Activity: Goal: Risk for activity intolerance will decrease Outcome: Progressing   Problem: Elimination: Goal: Will not experience complications related to bowel motility Outcome: Progressing   Problem: Safety: Goal: Ability to remain free from injury will improve Outcome: Progressing   Problem: Skin Integrity: Goal: Risk for impaired skin integrity will decrease Outcome: Progressing

## 2019-10-16 NOTE — Progress Notes (Signed)
IV team paged regarding Rituxan infusion ready for pickup per pharmacy and admin. Awaiting response. Will continue to monitor.

## 2019-10-16 NOTE — Progress Notes (Signed)
10/16/19  Today:  Hold dexamethasone 20 mg IVPB prior to Ritxuan as patient was given dexamethasone 40 mg IVPB this morning.  20 mg dexamethasone dc'd off MAR.  T.O. Dr Audie Clear, PharmD

## 2019-10-16 NOTE — Progress Notes (Addendum)
[name]   DOB:Sep 04, 1975   UE#:[id number]   JYN#:[id number]  Hematology follow up   Subjective: Tolerating heparin infusion well overall.  She was started on Coumadin last night.  States that she had a small amount of bleeding around one of her teeth earlier today which has now stopped.  She has not had any other issues with bleeding.  States that she is having difficulty sleeping.  Reports that her right upper leg is more swollen and feels like something is moving within it.  Objective:  Vitals:   10/16/19 1504 10/16/19 1533  BP: 110/69 (!) 95/56  Pulse: 66 75  Resp: 18 18  Temp: 97.9 F (36.6 C) 98.5 F (36.9 C)  SpO2: 99% 96%    Body mass index is 51.81 kg/m.  Intake/Output Summary (Last 24 hours) at 10/16/2019 1608 Last data filed at 10/16/2019 0600 Gross per 24 hour  Intake 312.26 ml  Output --  Net 312.26 ml     Sclerae unicteric  Oropharynx no bleeding noted  No peripheral adenopathy  Lungs clear -- no rales or rhonchi  Heart regular rate and rhythm  Abdomen benign  MSK: Swelling in the lateral aspect of her right upper leg, small bruise noted slightly above this area, no discrete mass, reports tenderness to touch.  CBG (last 3)  No results for input(s): GLUCAP in the last 72 hours.   Labs:  Urine Studies No results for input(s): UHGB, CRYS in the last 72 hours.  Invalid input(s): UACOL, UAPR, USPG, UPH, UTP, UGL, UKET, UBIL, UNIT, UROB, ULEU, UEPI, UWBC, URBC, UBAC, CAST, [place], [place]  Basic Metabolic Panel: Recent Labs  Lab 10/10/19 0303 10/10/19 0303 10/13/19 0449 10/15/19 0544  NA 137  --  138 132*  K 3.5   < > 3.4* 4.4  CL 105  --  105 106  CO2 25  --  24 21*  GLUCOSE 138*  --  140* 122*  BUN <5*  --  10 17  CREATININE 0.62  --  0.65 0.80  CALCIUM 8.1*  --  9.0 8.7*   < > = values in this interval not displayed.   GFR Estimated Creatinine Clearance: 94.4 mL/min (by C-G formula based on SCr of 0.8 mg/dL). Liver Function  Tests: Recent Labs  Lab 10/13/19 0449  AST 41  ALT 36  ALKPHOS 55  BILITOT 0.3  PROT 8.0  ALBUMIN 3.3*   No results for input(s): LIPASE, AMYLASE in the last 168 hours. No results for input(s): AMMONIA in the last 168 hours. Coagulation profile Recent Labs  Lab 10/10/19 0303 10/13/19 0449 10/15/19 0544 10/16/19 0506  INR 1.1 1.5* 1.8* 1.4*    CBC: Recent Labs  Lab 10/13/19 0449 10/13/19 1629 10/14/19 0232 10/14/19 0928 10/14/19 1559 10/15/19 0544 10/16/19 0506  WBC 13.2*   < > 21.0* 25.1* 24.6* 20.0* 21.7*  NEUTROABS 8.9*  --   --   --   --   --   --   HGB 11.4*   < > 10.0* 9.9* 9.7* 9.6* 10.5*  HCT 35.2*   < > 30.8* 31.0* 30.8* 31.4* 32.5*  MCV 89.3   < > 88.3 88.8 89.0 90.8 89.0  PLT <5*   < > 49* 42* 62* 76* 77*   < > = values in this interval not displayed.   Cardiac Enzymes: No results for input(s): CKTOTAL, CKMB, CKMBINDEX, TROPONINI in the last 168 hours. BNP: Invalid input(s): POCBNP CBG: No results for input(s): GLUCAP in the last 168  hours. D-Dimer No results for input(s): DDIMER in the last 72 hours. Hgb A1c No results for input(s): HGBA1C in the last 72 hours. Lipid Profile No results for input(s): CHOL, HDL, LDLCALC, TRIG, CHOLHDL, LDLDIRECT in the last 72 hours. Thyroid function studies No results for input(s): TSH, T4TOTAL, T3FREE, THYROIDAB in the last 72 hours.  Invalid input(s): FREET3 Anemia work up No results for input(s): VITAMINB12, FOLATE, FERRITIN, TIBC, IRON, RETICCTPCT in the last 72 hours. Microbiology Recent Results (from the past 240 hour(s))  SARS Coronavirus 2 by RT PCR (hospital order, performed in [facility] hospital lab) Nasopharyngeal Nasopharyngeal Swab     Status: None   Collection Time: 10/07/19  5:30 PM   Specimen: Nasopharyngeal Swab  Result Value Ref Range Status   SARS Coronavirus 2 NEGATIVE NEGATIVE Final    Comment: (NOTE) SARS-CoV-2 target nucleic acids are NOT DETECTED.  The SARS-CoV-2 RNA is generally  detectable in upper and lower respiratory specimens during the acute phase of infection. The lowest concentration of SARS-CoV-2 viral copies this assay can detect is 250 copies / mL. A negative result does not preclude SARS-CoV-2 infection and should not be used as the sole basis for treatment or other patient management decisions.  A negative result may occur with improper specimen collection / handling, submission of specimen other than nasopharyngeal swab, presence of viral mutation(s) within the areas targeted by this assay, and inadequate number of viral copies (<250 copies / mL). A negative result must be combined with clinical observations, patient history, and epidemiological information.  Fact Sheet for Patients:   BoilerBrush.com.cy  Fact Sheet for Healthcare Providers: https://pope.com/  This test is not yet approved or  cleared by the [place] FDA and has been authorized for detection and/or diagnosis of SARS-CoV-2 by FDA under an Emergency Use Authorization (EUA).  This EUA will remain in effect (meaning this test can be used) for the duration of the COVID-19 declaration under Section 564(b)(1) of the Act, 21 U.S.C. section 360bbb-3(b)(1), unless the authorization is terminated or revoked sooner.  Performed at [facility], [address], [place], [place] [address]   SARS Coronavirus 2 by RT PCR (hospital order, performed in [facility] hospital lab) Nasopharyngeal Nasopharyngeal Swab     Status: None   Collection Time: 10/13/19  9:25 AM   Specimen: Nasopharyngeal Swab  Result Value Ref Range Status   SARS Coronavirus 2 NEGATIVE NEGATIVE Final    Comment: (NOTE) SARS-CoV-2 target nucleic acids are NOT DETECTED.  The SARS-CoV-2 RNA is generally detectable in upper and lower respiratory specimens during the acute phase of infection. The lowest concentration of SARS-CoV-2 viral copies this assay can detect is  250 copies / mL. A negative result does not preclude SARS-CoV-2 infection and should not be used as the sole basis for treatment or other patient management decisions.  A negative result may occur with improper specimen collection / handling, submission of specimen other than nasopharyngeal swab, presence of viral mutation(s) within the areas targeted by this assay, and inadequate number of viral copies (<250 copies / mL). A negative result must be combined with clinical observations, patient history, and epidemiological information.  Fact Sheet for Patients:   BoilerBrush.com.cy  Fact Sheet for Healthcare Providers: https://pope.com/  This test is not yet approved or  cleared by the [place] FDA and has been authorized for detection and/or diagnosis of SARS-CoV-2 by FDA under an Emergency Use Authorization (EUA).  This EUA will remain in effect (meaning this test  can be used) for the duration of the COVID-19 declaration under Section 564(b)(1) of the Act, 21 U.S.C. section 360bbb-3(b)(1), unless the authorization is terminated or revoked sooner.  Performed at [facility], [address], [place], [place] [address]   Urine culture     Status: Abnormal   Collection Time: 10/13/19  3:25 PM   Specimen: Urine, Random  Result Value Ref Range Status   Specimen Description URINE, RANDOM  Final   Special Requests   Final    NONE Performed at [facility], [address] [place], [place] [address]    Culture MULTIPLE SPECIES PRESENT, SUGGEST RECOLLECTION (A)  Final   Report Status 10/15/2019 FINAL  Final      Studies:  No results found.  Assessment: 44 y.o. female   1.  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  Submassive bilateral PE  3.  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  History of mild subarachnoid hemorrhage 5.  Obesity 6. Chronic headaches and dizziness  7. Depression  8.  Right upper leg swelling-unclear etiology?  DVT versus hematoma  Plan:  -Platelets stable this morning at 77,000, reported a small amount of bleeding around her tooth which has now stopped -Continue heparin and Coumadin -Rutuximab infusion to begin today.  Will premed with Pepcid, Tylenol, Benadryl.  Adverse effects have been discussed with the patient and her daughter who state understanding.  Plan is for weekly Rituxan x4 starting today. -continue watch CBC closely  -We will order ultrasound of right upper leg to evaluate swelling.  Clenton Pare, NP 10/16/2019  4:08 PM   Addendum  I have seen the patient, examined her. I agree with the assessment and and plan and have edited the notes.   She is stable,  Mild gum bleeding this afternoon. I coordinate with pharmacy and her nurse, Rituximab started in early afternoon.   Will continue f/u  Malachy Mood  10/16/2019

## 2019-10-16 NOTE — Progress Notes (Signed)
10/16/19  Proceed with Rituximab - no need to wait on Hep B results as Dr Mosetta Putt is aware of previous positive results.  Pharmacist at [facility] is on board with lab needs and plan to proceed.  T.O. Dr Audie Clear, PharmD

## 2019-10-16 NOTE — Progress Notes (Signed)
ANTICOAGULATION CONSULT NOTE  Pharmacy Consult for Heparin >> Warfarin Indication: pulmonary embolus  No Known Allergies  Patient Measurements: Height: 4\' 9"  (144.8 cm) Weight: (!) 108.6 kg (239 lb 6.4 oz) IBW/kg (Calculated) : 38.6 Heparin Dosing Weight: 73.3 kg  Vital Signs: Temp: 98.3 F (36.8 C) (07/29 1433) Temp Source: Oral (07/29 1433) BP: 109/69 (07/29 1433) Pulse Rate: 61 (07/29 1433)  Labs: Recent Labs    10/14/19 1559 10/14/19 2120 10/15/19 0544 10/15/19 1247 10/15/19 1927 10/16/19 0506 10/16/19 1159  HGB 9.7*  --  9.6*  --   --  10.5*  --   HCT 30.8*  --  31.4*  --   --  32.5*  --   PLT 62*  --  76*  --   --  77*  --   LABPROT  --   --  20.6*  --   --  16.8*  --   INR  --   --  1.8*  --   --  1.4*  --   HEPARINUNFRC  --    < > 0.77*   < > 0.63 0.50 0.50  CREATININE  --   --  0.80  --   --   --   --    < > = values in this interval not displayed.    Assessment: Patient is a 44 yr old female that presented with at least submassive PE. Patient has a history of ITP, plt now up to 62 (up from <5 S.P 225 mcg Nplate). Per Hematology, wait to start heparin (no bolus) until plt>30. Pt takes warfarin 5 mg daily for DVT (last dose was on 7/25 at 1300, per med rec); INR was 1.5 on admission. Patient had left iliac vein stenting on 7/21.   7/27 Dopplers:  R: no evidence of DVT L: age-indeterminate DVT involving L peroneal veins, L posterior tibial veins; chronic DVT involving L femoral vein  Pharmacy was consulted on 7/28 to restart warfarin. Today, H/H stable, platelets up to 77. INR is 1.4 (down from 1.8 yesterday). HL therapeutic this AM, however 1200 HL still pending.   Goal of Therapy:  Heparin level 0.3-0.5 units/ml (no bolus) (lower goal, due to low plt count) INR goal: 2-3 Monitor platelets by anticoagulation protocol: Yes   Plan:  Cont heparin 450 units/hr F/u 1200 HL Warfarin 5 mg PO x1 again this evening. Monitor daily heparin level, INR,  CBC Monitor for signs/symptoms of bleeding  8/28, PharmD PGY1 Acute Care Pharmacy Resident 10/16/2019 3:04 PM   ADDENDUM: Heparin level remains therapeutic for lower goal at 0.5. Will continue at same rate for now. No current active bleed issues reported.  10/18/2019, PharmD, BCPS Please check AMION for all [facility] contact numbers Clinical Pharmacist 10/16/2019 3:05 PM

## 2019-10-16 NOTE — Progress Notes (Signed)
10/16/19  Received order from Dr Mosetta Putt to add the following premedications to rituximab plan:  Dexamethasone 20 mg IVPB x 1 Famotidine 20 mg IVPB x 1  Plan updated to reflect above requests.  T.O. Dr Audie Clear, PharmD

## 2019-10-16 NOTE — Progress Notes (Signed)
PROGRESS NOTE    [name]  TFT:[id number] DOB: [birth year] DOA: 10/13/2019 PCP: Malachy Mood, MD    Brief Narrative:  [phone] with hx chronic ITP who was recently admitted for DVT on coumadin presents with bleeding gums and CT findings of PE. Pt was initially admitted to critical care service for closer monitorin  Assessment & Plan:   Active Problems:   Thrombocytopenia (HCC)   Acute pulmonary embolism (HCC)   1. ITP with thrombocytopenia 1. Hematology following 2. Pt completed 4 days of IV steroids 3. To start Rutuximab for refractory ITP per Hematology 4. Plts are trending up, currently 77k this AM 5. Will repeat cbc in AM 2. DVT/PE 1. Currently on heparin gtt 2. Seen by Vascular Surgery, considered not candidate for IVC filter 3. Have resumed coumadin as of 7/28 per pharmacy dosing 4. Follow INR trends 3. Depression 1. Seems to be stable currently 4. GERD 1. Currently stable at this time 5. Morbid obesity 1. Have recommended diet/lifestyle modification 6. Chronic anticoagulation 1. Per above 7. R thigh pain 1. Pt reports intermittent R thigh pain and swelling 2. R thigh [place] pending 3. Hgb stable  DVT prophylaxis: Heparin bridge, coumadin Code Status: Full Family Communication: Pt in room, family at bedside  Status is: Inpatient  Remains inpatient appropriate because:Unsafe d/c plan, IV treatments appropriate due to intensity of illness or inability to take PO and Inpatient level of care appropriate due to severity of illness   Dispo: The patient is from: Home              Anticipated d/c is to: Home              Anticipated d/c date is: > 3 days              Patient currently is not medically stable to d/c.   Consultants:   Critical care  Hematology  Vascular Surgery  Procedures:     Antimicrobials: Anti-infectives (From admission, onward)   None      Subjective: Complains of intermittent pain and swelling of R  thigh  Objective: Vitals:   10/16/19 0554 10/16/19 1433 10/16/19 1504 10/16/19 1533  BP: (!) 106/62 109/69 110/69 (!) 95/56  Pulse: 63 61 66 75  Resp: 18 18 18 18   Temp: 98 F (36.7 C) 98.3 F (36.8 C) 97.9 F (36.6 C) 98.5 F (36.9 C)  TempSrc: Oral Oral Oral Oral  SpO2: 96% 97% 99% 96%  Weight:      Height:        Intake/Output Summary (Last 24 hours) at 10/16/2019 1549 Last data filed at 10/16/2019 0600 Gross per 24 hour  Intake 312.26 ml  Output --  Net 312.26 ml   Filed Weights   10/13/19 0421 10/14/19 1800 10/15/19 0525  Weight: (!) 111.6 kg (!) 106.7 kg (!) 108.6 kg    Examination: General exam: Awake, laying in bed, in nad Respiratory system: Normal respiratory effort, no wheezing Cardiovascular system: regular rate, s1, s2 Gastrointestinal system: Soft, nondistended, positive BS Central nervous system: CN2-12 grossly intact, strength intact Extremities: Perfused, no clubbing Skin: Normal skin turgor, no notable skin lesions seen Psychiatry: Mood normal // no visual hallucinations   Data Reviewed: I have personally reviewed following labs and imaging studies  CBC: Recent Labs  Lab 10/13/19 0449 10/13/19 1629 10/14/19 0232 10/14/19 0928 10/14/19 1559 10/15/19 0544 10/16/19 0506  WBC 13.2*   < > 21.0* 25.1* 24.6* 20.0* 21.7*  NEUTROABS 8.9*  --   --   --   --   --   --  HGB 11.4*   < > 10.0* 9.9* 9.7* 9.6* 10.5*  HCT 35.2*   < > 30.8* 31.0* 30.8* 31.4* 32.5*  MCV 89.3   < > 88.3 88.8 89.0 90.8 89.0  PLT <5*   < > 49* 42* 62* 76* 77*   < > = values in this interval not displayed.   Basic Metabolic Panel: Recent Labs  Lab 10/10/19 0303 10/13/19 0449 10/15/19 0544  NA 137 138 132*  K 3.5 3.4* 4.4  CL 105 105 106  CO2 25 24 21*  GLUCOSE 138* 140* 122*  BUN <5* 10 17  CREATININE 0.62 0.65 0.80  CALCIUM 8.1* 9.0 8.7*   GFR: Estimated Creatinine Clearance: 94.4 mL/min (by C-G formula based on SCr of 0.8 mg/dL). Liver Function Tests: Recent  Labs  Lab 10/13/19 0449  AST 41  ALT 36  ALKPHOS 55  BILITOT 0.3  PROT 8.0  ALBUMIN 3.3*   No results for input(s): LIPASE, AMYLASE in the last 168 hours. No results for input(s): AMMONIA in the last 168 hours. Coagulation Profile: Recent Labs  Lab 10/10/19 0303 10/13/19 0449 10/15/19 0544 10/16/19 0506  INR 1.1 1.5* 1.8* 1.4*   Cardiac Enzymes: No results for input(s): CKTOTAL, CKMB, CKMBINDEX, TROPONINI in the last 168 hours. BNP (last 3 results) No results for input(s): PROBNP in the last 8760 hours. HbA1C: No results for input(s): HGBA1C in the last 72 hours. CBG: No results for input(s): GLUCAP in the last 168 hours. Lipid Profile: No results for input(s): CHOL, HDL, LDLCALC, TRIG, CHOLHDL, LDLDIRECT in the last 72 hours. Thyroid Function Tests: No results for input(s): TSH, T4TOTAL, FREET4, T3FREE, THYROIDAB in the last 72 hours. Anemia Panel: No results for input(s): VITAMINB12, FOLATE, FERRITIN, TIBC, IRON, RETICCTPCT in the last 72 hours. Sepsis Labs: No results for input(s): PROCALCITON, LATICACIDVEN in the last 168 hours.  Recent Results (from the past 240 hour(s))  SARS Coronavirus 2 by RT PCR (hospital order, performed in [facility] hospital lab) Nasopharyngeal Nasopharyngeal Swab     Status: None   Collection Time: 10/07/19  5:30 PM   Specimen: Nasopharyngeal Swab  Result Value Ref Range Status   SARS Coronavirus 2 NEGATIVE NEGATIVE Final    Comment: (NOTE) SARS-CoV-2 target nucleic acids are NOT DETECTED.  The SARS-CoV-2 RNA is generally detectable in upper and lower respiratory specimens during the acute phase of infection. The lowest concentration of SARS-CoV-2 viral copies this assay can detect is 250 copies / mL. A negative result does not preclude SARS-CoV-2 infection and should not be used as the sole basis for treatment or other patient management decisions.  A negative result may occur with improper specimen collection / handling,  submission of specimen other than nasopharyngeal swab, presence of viral mutation(s) within the areas targeted by this assay, and inadequate number of viral copies (<250 copies / mL). A negative result must be combined with clinical observations, patient history, and epidemiological information.  Fact Sheet for Patients:   BoilerBrush.com.cy  Fact Sheet for Healthcare Providers: https://pope.com/  This test is not yet approved or  cleared by the [place] FDA and has been authorized for detection and/or diagnosis of SARS-CoV-2 by FDA under an Emergency Use Authorization (EUA).  This EUA will remain in effect (meaning this test can be used) for the duration of the COVID-19 declaration under Section 564(b)(1) of the Act, 21 U.S.C. section 360bbb-3(b)(1), unless the authorization is terminated or revoked sooner.  Performed at [facility], [address],  [place], [place] [address]   SARS Coronavirus 2 by RT PCR (hospital order, performed in [facility] hospital lab) Nasopharyngeal Nasopharyngeal Swab     Status: None   Collection Time: 10/13/19  9:25 AM   Specimen: Nasopharyngeal Swab  Result Value Ref Range Status   SARS Coronavirus 2 NEGATIVE NEGATIVE Final    Comment: (NOTE) SARS-CoV-2 target nucleic acids are NOT DETECTED.  The SARS-CoV-2 RNA is generally detectable in upper and lower respiratory specimens during the acute phase of infection. The lowest concentration of SARS-CoV-2 viral copies this assay can detect is 250 copies / mL. A negative result does not preclude SARS-CoV-2 infection and should not be used as the sole basis for treatment or other patient management decisions.  A negative result may occur with improper specimen collection / handling, submission of specimen other than nasopharyngeal swab, presence of viral mutation(s) within the areas targeted by this assay, and inadequate number of viral  copies (<250 copies / mL). A negative result must be combined with clinical observations, patient history, and epidemiological information.  Fact Sheet for Patients:   BoilerBrush.com.cy  Fact Sheet for Healthcare Providers: https://pope.com/  This test is not yet approved or  cleared by the [place] FDA and has been authorized for detection and/or diagnosis of SARS-CoV-2 by FDA under an Emergency Use Authorization (EUA).  This EUA will remain in effect (meaning this test can be used) for the duration of the COVID-19 declaration under Section 564(b)(1) of the Act, 21 U.S.C. section 360bbb-3(b)(1), unless the authorization is terminated or revoked sooner.  Performed at [facility], [address], [place], [place] [address]   Urine culture     Status: Abnormal   Collection Time: 10/13/19  3:25 PM   Specimen: Urine, Random  Result Value Ref Range Status   Specimen Description URINE, RANDOM  Final   Special Requests   Final    NONE Performed at [facility], [address], [place], [place] [address]    Culture MULTIPLE SPECIES PRESENT, SUGGEST RECOLLECTION (A)  Final   Report Status 10/15/2019 FINAL  Final     Radiology Studies: No results found.  Scheduled Meds: . Chlorhexidine Gluconate Cloth  6 each Topical Daily  . pantoprazole  40 mg Oral Daily  . sodium chloride flush  3 mL Intravenous Once  . warfarin  5 mg Oral ONCE-1600  . Warfarin - Pharmacist Dosing Inpatient   Does not apply q1600   Continuous Infusions: . sodium chloride    . famotidine (PEPCID) IV (ONCOLOGY)    . heparin 450 Units/hr (10/16/19 0600)  . sodium chloride 0.9 % 25 mL with dexamethasone (DECADRON) 40 mg infusion 50 mL/hr at 10/16/19 0951     LOS: 3 days   Rickey Barbara, MD [facility] Pager On Amion  If 7PM-7AM, please contact night-coverage 10/16/2019, 3:49 PM

## 2019-10-16 NOTE — Plan of Care (Signed)
  Problem: Education: Goal: Knowledge of General Education information will improve Description Including pain rating scale, medication(s)/side effects and non-pharmacologic comfort measures Outcome: Progressing   

## 2019-10-17 ENCOUNTER — Inpatient Hospital Stay (HOSPITAL_COMMUNITY): Payer: Medicaid Other

## 2019-10-17 DIAGNOSIS — R238 Other skin changes: Secondary | ICD-10-CM

## 2019-10-17 DIAGNOSIS — M7989 Other specified soft tissue disorders: Secondary | ICD-10-CM

## 2019-10-17 LAB — CBC
HCT: 33.5 % — ABNORMAL LOW (ref 36.0–46.0)
Hemoglobin: 10.7 g/dL — ABNORMAL LOW (ref 12.0–15.0)
MCH: 28.1 pg (ref 26.0–34.0)
MCHC: 31.9 g/dL (ref 30.0–36.0)
MCV: 87.9 fL (ref 80.0–100.0)
Platelets: 228 10*3/uL (ref 150–400)
RBC: 3.81 MIL/uL — ABNORMAL LOW (ref 3.87–5.11)
RDW: 15.4 % (ref 11.5–15.5)
WBC: 22.2 10*3/uL — ABNORMAL HIGH (ref 4.0–10.5)
nRBC: 1.4 % — ABNORMAL HIGH (ref 0.0–0.2)

## 2019-10-17 LAB — GLUCOSE, CAPILLARY: Glucose-Capillary: 179 mg/dL — ABNORMAL HIGH (ref 70–99)

## 2019-10-17 LAB — PROTIME-INR
INR: 1.6 — ABNORMAL HIGH (ref 0.8–1.2)
Prothrombin Time: 18.5 seconds — ABNORMAL HIGH (ref 11.4–15.2)

## 2019-10-17 LAB — HEPARIN LEVEL (UNFRACTIONATED): Heparin Unfractionated: 0.37 IU/mL (ref 0.30–0.70)

## 2019-10-17 MED ORDER — WARFARIN SODIUM 7.5 MG PO TABS
7.5000 mg | ORAL_TABLET | Freq: Once | ORAL | Status: AC
  Administered 2019-10-17: 7.5 mg via ORAL
  Filled 2019-10-17: qty 1

## 2019-10-17 NOTE — Progress Notes (Addendum)
[name]   DOB:December 01, 1975   MG#:[id number]   QBV#:[id number]  Hematology follow up   Subjective: No bleeding reported today.  Still notices some swelling in her right upper leg which is still tender.  States that it is somewhat better today.  Tolerated first dose of Rituxan well.  She has no other complaints.  Objective:  Vitals:   10/17/19 0509 10/17/19 1256  BP: 112/68 113/65  Pulse: 62 63  Resp: 18 17  Temp: 98 F (36.7 C) 98.2 F (36.8 C)  SpO2: 98% 96%    Body mass index is 51.81 kg/m. No intake or output data in the 24 hours ending 10/17/19 1440   Sclerae unicteric  Oropharynx no bleeding noted  No peripheral adenopathy  Lungs clear -- no rales or rhonchi  Heart regular rate and rhythm  Abdomen benign  MSK: Swelling in the lateral aspect of her right upper leg, small bruise noted slightly above this area, no discrete mass, reports tenderness to touch, stable  CBG (last 3)  Recent Labs    10/17/19 0514  GLUCAP 179*     Labs:  Urine Studies No results for input(s): UHGB, CRYS in the last 72 hours.  Invalid input(s): UACOL, UAPR, USPG, UPH, UTP, UGL, UKET, UBIL, UNIT, UROB, ULEU, UEPI, UWBC, URBC, UBAC, CAST, UCOM, BILUA  Basic Metabolic Panel: Recent Labs  Lab 10/13/19 0449 10/15/19 0544  NA 138 132*  K 3.4* 4.4  CL 105 106  CO2 24 21*  GLUCOSE 140* 122*  BUN 10 17  CREATININE 0.65 0.80  CALCIUM 9.0 8.7*   GFR Estimated Creatinine Clearance: 94.4 mL/min (by C-G formula based on SCr of 0.8 mg/dL). Liver Function Tests: Recent Labs  Lab 10/13/19 0449  AST 41  ALT 36  ALKPHOS 55  BILITOT 0.3  PROT 8.0  ALBUMIN 3.3*   No results for input(s): LIPASE, AMYLASE in the last 168 hours. No results for input(s): AMMONIA in the last 168 hours. Coagulation profile Recent Labs  Lab 10/13/19 0449 10/15/19 0544 10/16/19 0506 10/17/19 0201  INR 1.5* 1.8* 1.4* 1.6*    CBC: Recent Labs  Lab 10/13/19 0449 10/13/19 1629 10/14/19 0928  10/14/19 1559 10/15/19 0544 10/16/19 0506 10/17/19 0201  WBC 13.2*   < > 25.1* 24.6* 20.0* 21.7* 22.2*  NEUTROABS 8.9*  --   --   --   --   --   --   HGB 11.4*   < > 9.9* 9.7* 9.6* 10.5* 10.7*  HCT 35.2*   < > 31.0* 30.8* 31.4* 32.5* 33.5*  MCV 89.3   < > 88.8 89.0 90.8 89.0 87.9  PLT <5*   < > 42* 62* 76* 77* 228   < > = values in this interval not displayed.   Cardiac Enzymes: No results for input(s): CKTOTAL, CKMB, CKMBINDEX, TROPONINI in the last 168 hours. BNP: Invalid input(s): POCBNP CBG: Recent Labs  Lab 10/17/19 0514  GLUCAP 179*   D-Dimer No results for input(s): DDIMER in the last 72 hours. Hgb A1c No results for input(s): HGBA1C in the last 72 hours. Lipid Profile No results for input(s): CHOL, HDL, LDLCALC, TRIG, CHOLHDL, LDLDIRECT in the last 72 hours. Thyroid function studies No results for input(s): TSH, T4TOTAL, T3FREE, THYROIDAB in the last 72 hours.  Invalid input(s): FREET3 Anemia work up No results for input(s): VITAMINB12, FOLATE, FERRITIN, TIBC, IRON, RETICCTPCT in the last 72 hours. Microbiology Recent Results (from the past 240 hour(s))  SARS Coronavirus 2 by RT PCR (hospital order,  performed in [facility] hospital lab) Nasopharyngeal Nasopharyngeal Swab     Status: None   Collection Time: 10/07/19  5:30 PM   Specimen: Nasopharyngeal Swab  Result Value Ref Range Status   SARS Coronavirus 2 NEGATIVE NEGATIVE Final    Comment: (NOTE) SARS-CoV-2 target nucleic acids are NOT DETECTED.  The SARS-CoV-2 RNA is generally detectable in upper and lower respiratory specimens during the acute phase of infection. The lowest concentration of SARS-CoV-2 viral copies this assay can detect is 250 copies / mL. A negative result does not preclude SARS-CoV-2 infection and should not be used as the sole basis for treatment or other patient management decisions.  A negative result may occur with improper specimen collection / handling, submission of specimen  other than nasopharyngeal swab, presence of viral mutation(s) within the areas targeted by this assay, and inadequate number of viral copies (<250 copies / mL). A negative result must be combined with clinical observations, patient history, and epidemiological information.  Fact Sheet for Patients:   BoilerBrush.com.cy  Fact Sheet for Healthcare Providers: https://pope.com/  This test is not yet approved or  cleared by the [place] FDA and has been authorized for detection and/or diagnosis of SARS-CoV-2 by FDA under an Emergency Use Authorization (EUA).  This EUA will remain in effect (meaning this test can be used) for the duration of the COVID-19 declaration under Section 564(b)(1) of the Act, 21 U.S.C. section 360bbb-3(b)(1), unless the authorization is terminated or revoked sooner.  Performed at [facility], [address] [place], [place] [address]   SARS Coronavirus 2 by RT PCR (hospital order, performed in [facility] hospital lab) Nasopharyngeal Nasopharyngeal Swab     Status: None   Collection Time: 10/13/19  9:25 AM   Specimen: Nasopharyngeal Swab  Result Value Ref Range Status   SARS Coronavirus 2 NEGATIVE NEGATIVE Final    Comment: (NOTE) SARS-CoV-2 target nucleic acids are NOT DETECTED.  The SARS-CoV-2 RNA is generally detectable in upper and lower respiratory specimens during the acute phase of infection. The lowest concentration of SARS-CoV-2 viral copies this assay can detect is 250 copies / mL. A negative result does not preclude SARS-CoV-2 infection and should not be used as the sole basis for treatment or other patient management decisions.  A negative result may occur with improper specimen collection / handling, submission of specimen other than nasopharyngeal swab, presence of viral mutation(s) within the areas targeted by this assay, and inadequate number of viral copies (<250 copies / mL). A  negative result must be combined with clinical observations, patient history, and epidemiological information.  Fact Sheet for Patients:   BoilerBrush.com.cy  Fact Sheet for Healthcare Providers: https://pope.com/  This test is not yet approved or  cleared by the [place] FDA and has been authorized for detection and/or diagnosis of SARS-CoV-2 by FDA under an Emergency Use Authorization (EUA).  This EUA will remain in effect (meaning this test can be used) for the duration of the COVID-19 declaration under Section 564(b)(1) of the Act, 21 U.S.C. section 360bbb-3(b)(1), unless the authorization is terminated or revoked sooner.  Performed at [facility], [address] [place], [place] [address]   Urine culture     Status: Abnormal   Collection Time: 10/13/19  3:25 PM   Specimen: Urine, Random  Result Value Ref Range Status   Specimen Description URINE, RANDOM  Final   Special Requests   Final    NONE Performed at [facility],  [address], [place], [place] [address]    Culture MULTIPLE SPECIES PRESENT, SUGGEST RECOLLECTION (A)  Final   Report Status 10/15/2019 FINAL  Final      Studies:  No results found.  Assessment: 44 y.o. female   1.  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  Submassive bilateral PE  3.  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  History of mild subarachnoid hemorrhage 5.  Obesity 6. Chronic headaches and dizziness  7. Depression  8.  Right upper leg swelling-unclear etiology?  DVT versus hematoma  Plan:  -Platelets significantly improved today to 220,000.  We will plan for weekly Rituxan times a total of 4 doses.  Scheduling message sent to the cancer center to arrange for labs and Rituxan weekly x3 starting 10/23/2019. -Continue heparin and  Coumadin.  INR is 1.6 today.  Monitor INR daily.  She may discharged home once INR is therapeutic.  If she is discharged over the weekend, we will arrange for a PT/INR in our office either Monday or Tuesday next week. -continue to watch CBC closely  -Await ultrasound of right upper leg to evaluate swelling.  Clenton Pare, NP 10/17/2019  2:40 PM   Addendum  I have seen the patient, examined her. I agree with the assessment and and plan and have edited the notes.   Patient is clinically doing well, thrombocytopenia resolved today. No additional ITP treatment over the weekend.   I reviewed her ultrasound of right lower extremity with patient and his daughter, which showed small hematoma.  She has no other signs of bleeding.  Continue heparin drip, okay to discharge from hematology standpoint when her INR is therapeutic, and plt remains to be stable. We will set up weekly lab and rituximab infusion for next three weeks in our office. We will monitor her PT/INR after discharge.   Malachy Mood  10/17/2019

## 2019-10-17 NOTE — Progress Notes (Signed)
PROGRESS NOTE    [name]  IHK:[id number] DOB: 12/03/1975 DOA: 10/13/2019 PCP: Malachy Mood, MD    Brief Narrative:  [phone] with hx chronic ITP who was recently admitted for DVT on coumadin presents with bleeding gums and CT findings of PE. Pt was initially admitted to critical care service for closer monitorin  Assessment & Plan:   Active Problems:   Thrombocytopenia (HCC)   Acute pulmonary embolism (HCC)   1. ITP with thrombocytopenia 1. Hematology following 2. Pt completed 4 days of IV steroids 3. Patient had started Rutuximab for refractory ITP per Hematology 4. Plts have significantly improved to over 200 this morning 5. Recheck CBC in the morning 2. DVT/PE 1. Currently on heparin gtt bridge with Coumadin 2. Seen by Vascular Surgery, considered not candidate for IVC filter 3. Follow INR trends 3. Depression 1. Seems to be stable at this time 4. GERD 1. Currently stable at this time 5. Morbid obesity 1. Have recommended diet/lifestyle modification 6. Chronic anticoagulation 1. Per above 7. R thigh pain 1. Pt reports intermittent R thigh pain and swelling 2. Right thigh soft tissue ultrasound ordered and obtained, results are pending 3. Chart reviewed, patient did have a recent lower extremity Dopplers bilaterally which confirmed extensive left lower extremity DVT, no DVT noted in the right lower extremity that study, reviewed 4. Hgb stable at this time  DVT prophylaxis: Heparin bridge, coumadin Code Status: Full Family Communication: Pt in room, family at bedside  Status is: Inpatient  Remains inpatient appropriate because:Unsafe d/c plan, IV treatments appropriate due to intensity of illness or inability to take PO and Inpatient level of care appropriate due to severity of illness   Dispo: The patient is from: Home              Anticipated d/c is to: Home              Anticipated d/c date is: > 3 days              Patient currently is not medically  stable to d/c.   Consultants:   Critical care  Hematology  Vascular Surgery  Procedures:     Antimicrobials: Anti-infectives (From admission, onward)   None      Subjective: Still reporting discomfort and swelling involving right thigh  Objective: Vitals:   10/16/19 1950 10/16/19 2004 10/17/19 0509 10/17/19 1256  BP: 107/76 (!) 90/43 112/68 113/65  Pulse: 70 74 62 63  Resp: 18 17 18 17   Temp: 98.4 F (36.9 C) 98.2 F (36.8 C) 98 F (36.7 C) 98.2 F (36.8 C)  TempSrc: Oral Oral Oral Oral  SpO2: 97% 97% 98% 96%  Weight:      Height:       No intake or output data in the 24 hours ending 10/17/19 1612 Filed Weights   10/13/19 0421 10/14/19 1800 10/15/19 0525  Weight: (!) 111.6 kg (!) 106.7 kg (!) 108.6 kg    Examination: General exam: Conversant, in no acute distress Respiratory system: normal chest rise, clear, no audible wheezing Cardiovascular system: regular rhythm, s1-s2 Gastrointestinal system: Nondistended, nontender, pos BS Central nervous system: No seizures, no tremors Extremities: No cyanosis, no joint deformities Skin: No rashes, no pallor Psychiatry: Affect normal // no auditory hallucinations   Data Reviewed: I have personally reviewed following labs and imaging studies  CBC: Recent Labs  Lab 10/13/19 0449 10/13/19 1629 10/14/19 0928 10/14/19 1559 10/15/19 0544 10/16/19 0506 10/17/19 0201  WBC 13.2*   < >  25.1* 24.6* 20.0* 21.7* 22.2*  NEUTROABS 8.9*  --   --   --   --   --   --   HGB 11.4*   < > 9.9* 9.7* 9.6* 10.5* 10.7*  HCT 35.2*   < > 31.0* 30.8* 31.4* 32.5* 33.5*  MCV 89.3   < > 88.8 89.0 90.8 89.0 87.9  PLT <5*   < > 42* 62* 76* 77* 228   < > = values in this interval not displayed.   Basic Metabolic Panel: Recent Labs  Lab 10/13/19 0449 10/15/19 0544  NA 138 132*  K 3.4* 4.4  CL 105 106  CO2 24 21*  GLUCOSE 140* 122*  BUN 10 17  CREATININE 0.65 0.80  CALCIUM 9.0 8.7*   GFR: Estimated Creatinine Clearance: 94.4  mL/min (by C-G formula based on SCr of 0.8 mg/dL). Liver Function Tests: Recent Labs  Lab 10/13/19 0449  AST 41  ALT 36  ALKPHOS 55  BILITOT 0.3  PROT 8.0  ALBUMIN 3.3*   No results for input(s): LIPASE, AMYLASE in the last 168 hours. No results for input(s): AMMONIA in the last 168 hours. Coagulation Profile: Recent Labs  Lab 10/13/19 0449 10/15/19 0544 10/16/19 0506 10/17/19 0201  INR 1.5* 1.8* 1.4* 1.6*   Cardiac Enzymes: No results for input(s): CKTOTAL, CKMB, CKMBINDEX, TROPONINI in the last 168 hours. BNP (last 3 results) No results for input(s): PROBNP in the last 8760 hours. HbA1C: No results for input(s): HGBA1C in the last 72 hours. CBG: Recent Labs  Lab 10/17/19 0514  GLUCAP 179*   Lipid Profile: No results for input(s): CHOL, HDL, LDLCALC, TRIG, CHOLHDL, LDLDIRECT in the last 72 hours. Thyroid Function Tests: No results for input(s): TSH, T4TOTAL, FREET4, T3FREE, THYROIDAB in the last 72 hours. Anemia Panel: No results for input(s): VITAMINB12, FOLATE, FERRITIN, TIBC, IRON, RETICCTPCT in the last 72 hours. Sepsis Labs: No results for input(s): PROCALCITON, LATICACIDVEN in the last 168 hours.  Recent Results (from the past 240 hour(s))  SARS Coronavirus 2 by RT PCR (hospital order, performed in [facility] hospital lab) Nasopharyngeal Nasopharyngeal Swab     Status: None   Collection Time: 10/07/19  5:30 PM   Specimen: Nasopharyngeal Swab  Result Value Ref Range Status   SARS Coronavirus 2 NEGATIVE NEGATIVE Final    Comment: (NOTE) SARS-CoV-2 target nucleic acids are NOT DETECTED.  The SARS-CoV-2 RNA is generally detectable in upper and lower respiratory specimens during the acute phase of infection. The lowest concentration of SARS-CoV-2 viral copies this assay can detect is 250 copies / mL. A negative result does not preclude SARS-CoV-2 infection and should not be used as the sole basis for treatment or other patient management decisions.  A  negative result may occur with improper specimen collection / handling, submission of specimen other than nasopharyngeal swab, presence of viral mutation(s) within the areas targeted by this assay, and inadequate number of viral copies (<250 copies / mL). A negative result must be combined with clinical observations, patient history, and epidemiological information.  Fact Sheet for Patients:   BoilerBrush.com.cy  Fact Sheet for Healthcare Providers: https://pope.com/  This test is not yet approved or  cleared by the [place] FDA and has been authorized for detection and/or diagnosis of SARS-CoV-2 by FDA under an Emergency Use Authorization (EUA).  This EUA will remain in effect (meaning this test can be used) for the duration of the COVID-19 declaration under Section 564(b)(1) of the Act, 21 U.S.C. section 360bbb-3(b)(1), unless  the authorization is terminated or revoked sooner.  Performed at [facility] Lab, [address] [place], [place] [address]   SARS Coronavirus 2 by RT PCR (hospital order, performed in [facility] hospital lab) Nasopharyngeal Nasopharyngeal Swab     Status: None   Collection Time: 10/13/19  9:25 AM   Specimen: Nasopharyngeal Swab  Result Value Ref Range Status   SARS Coronavirus 2 NEGATIVE NEGATIVE Final    Comment: (NOTE) SARS-CoV-2 target nucleic acids are NOT DETECTED.  The SARS-CoV-2 RNA is generally detectable in upper and lower respiratory specimens during the acute phase of infection. The lowest concentration of SARS-CoV-2 viral copies this assay can detect is 250 copies / mL. A negative result does not preclude SARS-CoV-2 infection and should not be used as the sole basis for treatment or other patient management decisions.  A negative result may occur with improper specimen collection / handling, submission of specimen other than nasopharyngeal swab, presence of viral mutation(s) within  the areas targeted by this assay, and inadequate number of viral copies (<250 copies / mL). A negative result must be combined with clinical observations, patient history, and epidemiological information.  Fact Sheet for Patients:   BoilerBrush.com.cy  Fact Sheet for Healthcare Providers: https://pope.com/  This test is not yet approved or  cleared by the [place] FDA and has been authorized for detection and/or diagnosis of SARS-CoV-2 by FDA under an Emergency Use Authorization (EUA).  This EUA will remain in effect (meaning this test can be used) for the duration of the COVID-19 declaration under Section 564(b)(1) of the Act, 21 U.S.C. section 360bbb-3(b)(1), unless the authorization is terminated or revoked sooner.  Performed at [facility], [address], [place], [place] [address]   Urine culture     Status: Abnormal   Collection Time: 10/13/19  3:25 PM   Specimen: Urine, Random  Result Value Ref Range Status   Specimen Description URINE, RANDOM  Final   Special Requests   Final    NONE Performed at [facility], [address] [place], [place] [address]    Culture MULTIPLE SPECIES PRESENT, SUGGEST RECOLLECTION (A)  Final   Report Status 10/15/2019 FINAL  Final     Radiology Studies: No results found.  Scheduled Meds: . Chlorhexidine Gluconate Cloth  6 each Topical Daily  . pantoprazole  40 mg Oral Daily  . sodium chloride flush  3 mL Intravenous Once  . warfarin  7.5 mg Oral ONCE-1600  . Warfarin - Pharmacist Dosing Inpatient   Does not apply q1600   Continuous Infusions: . sodium chloride    . famotidine (PEPCID) IV (ONCOLOGY)    . heparin 450 Units/hr (10/16/19 0600)     LOS: 4 days   Rickey Barbara, MD [facility] Pager On Amion  If 7PM-7AM, please contact night-coverage 10/17/2019, 4:12 PM

## 2019-10-17 NOTE — Plan of Care (Signed)
  Problem: Education: Goal: Knowledge of General Education information will improve Description: Including pain rating scale, medication(s)/side effects and non-pharmacologic comfort measures Outcome: Progressing   Problem: Activity: Goal: Risk for activity intolerance will decrease Outcome: Progressing   Problem: Elimination: Goal: Will not experience complications related to bowel motility Outcome: Progressing Goal: Will not experience complications related to urinary retention Outcome: Progressing   Problem: Safety: Goal: Ability to remain free from injury will improve Outcome: Progressing   

## 2019-10-17 NOTE — Progress Notes (Signed)
ANTICOAGULATION CONSULT NOTE  Pharmacy Consult for Heparin >> Warfarin Indication: pulmonary embolus  No Known Allergies  Patient Measurements: Height: 4\' 9"  (144.8 cm) Weight: (!) 108.6 kg (239 lb 6.4 oz) IBW/kg (Calculated) : 38.6 Heparin Dosing Weight: 73.3 kg  Vital Signs: Temp: 98 F (36.7 C) (07/30 0509) Temp Source: Oral (07/30 0509) BP: 112/68 (07/30 0509) Pulse Rate: 62 (07/30 0509)  Labs: Recent Labs    10/15/19 0544 10/15/19 1247 10/16/19 0506 10/16/19 1159 10/17/19 0201 10/17/19 0622  HGB 9.6*  --  10.5*  --  10.7*  --   HCT 31.4*  --  32.5*  --  33.5*  --   PLT 76*  --  77*  --  228  --   LABPROT 20.6*  --  16.8*  --  18.5*  --   INR 1.8*  --  1.4*  --  1.6*  --   HEPARINUNFRC 0.77*   < > 0.50 0.50  --  0.37  CREATININE 0.80  --   --   --   --   --    < > = values in this interval not displayed.    Assessment: Patient is a 44 yr old female that presented with at least submassive PE. Patient has a history of ITP, plt now up to 62 (up from <5 S.P 225 mcg Nplate). Per Hematology, wait to start heparin (no bolus) until plt>30. Pt takes warfarin 5 mg daily for DVT (last dose was on 7/25 at 1300, per med rec); INR was 1.5 on admission. Patient had left iliac vein stenting on 7/21.   7/27 Dopplers:  R: no evidence of DVT L: age-indeterminate DVT involving L peroneal veins, L posterior tibial veins; chronic DVT involving L femoral vein  Pharmacy was consulted on 7/28 to restart warfarin. Today, H/H stable, platelets up to 228. Received warfarin 5 mg yesterday. INR remains subtherapeutic at 1.6. HL remains therapeutic for lower goal at 0.37. Minor bleeding in gums (resolved) and small brusing noted yesterday but none documented today. Repeat LLE 8/28 pending.  Goal of Therapy:  Heparin level 0.3-0.5 units/ml (no bolus) (lower goal, due to low plt count) INR goal: 2-3 Monitor platelets by anticoagulation protocol: Yes   Plan:  Warfarin 7.5 mg PO x1 this  evening. Cont heparin 450 units/hr Monitor daily heparin level, INR, CBC Monitor for signs/symptoms of bleeding  [place], PharmD PGY1 Acute Care Pharmacy Resident 10/17/2019 9:46 AM

## 2019-10-17 NOTE — Progress Notes (Signed)
[facility]  Subjective  - tolerating heparin, no further bleeding.  On coumadin.   Objective 112/68 62 98 F (36.7 C) (Oral) 18 98% No intake or output data in the 24 hours ending 10/17/19 1023  Left leg looks good, swelling improved.  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 Admitted for thrombocytopenia and bleeding gums.  We were initially asked about IVC filter and suggested avoiding this if possible with aggressive treatment of her ITP. Seems to be tolerating heparin and platelet count improving and now 228 and hemoglobin stable and now 10.7. Coumadin has been started, INR 1.6. Pleased with her progress and again hoping to avoid an IVC filter and continuing anticoagulation to give her the best chance of keeping her iliac vein stent patent.  She has follow-up arranged with me already.  Please call vascular surgery if is any issues over the weekend.  I am out of town next week.  Cephus Shelling 10/17/2019 10:23 AM --

## 2019-10-18 LAB — URINALYSIS, COMPLETE (UACMP) WITH MICROSCOPIC
Bilirubin Urine: NEGATIVE
Glucose, UA: NEGATIVE mg/dL
Ketones, ur: NEGATIVE mg/dL
Nitrite: NEGATIVE
Protein, ur: NEGATIVE mg/dL
RBC / HPF: >50 RBC/hpf — ABNORMAL HIGH (ref 0–5)
Specific Gravity, Urine: 1.017 (ref 1.005–1.030)
WBC, UA: >50 WBC/hpf — ABNORMAL HIGH (ref 0–5)
pH: 7 (ref 5.0–8.0)

## 2019-10-18 LAB — CBC
HCT: 35.1 % — ABNORMAL LOW (ref 36.0–46.0)
Hemoglobin: 11.3 g/dL — ABNORMAL LOW (ref 12.0–15.0)
MCH: 28.5 pg (ref 26.0–34.0)
MCHC: 32.2 g/dL (ref 30.0–36.0)
MCV: 88.6 fL (ref 80.0–100.0)
Platelets: 213 10*3/uL (ref 150–400)
RBC: 3.96 MIL/uL (ref 3.87–5.11)
RDW: 15.4 % (ref 11.5–15.5)
WBC: 23.6 10*3/uL — ABNORMAL HIGH (ref 4.0–10.5)
nRBC: 1.5 % — ABNORMAL HIGH (ref 0.0–0.2)

## 2019-10-18 LAB — PROTIME-INR
INR: 1.8 — ABNORMAL HIGH (ref 0.8–1.2)
Prothrombin Time: 20.4 seconds — ABNORMAL HIGH (ref 11.4–15.2)

## 2019-10-18 LAB — HEPARIN LEVEL (UNFRACTIONATED): Heparin Unfractionated: 0.33 IU/mL (ref 0.30–0.70)

## 2019-10-18 MED ORDER — WARFARIN SODIUM 7.5 MG PO TABS
7.5000 mg | ORAL_TABLET | Freq: Once | ORAL | Status: AC
  Administered 2019-10-18: 7.5 mg via ORAL
  Filled 2019-10-18: qty 1

## 2019-10-18 NOTE — Progress Notes (Signed)
PROGRESS NOTE    [name]  MIW:[id number] DOB: Mar 09, 1976 DOA: 10/13/2019 PCP: Malachy Mood, MD    Brief Narrative:  [phone] with hx chronic ITP who was recently admitted for DVT on coumadin presents with bleeding gums and CT findings of PE. Pt was initially admitted to critical care service for closer monitorin  Assessment & Plan:   Active Problems:   Thrombocytopenia (HCC)   Acute pulmonary embolism (HCC)   1. ITP with thrombocytopenia 1. Hematology following 2. Pt completed 4 days of IV steroids 3. Patient had started Rutuximab for refractory ITP per Hematology 4. Plts have significantly improved and remains at over 202 this morning 5. Recheck CBC in the morning 2. DVT/PE 1. Currently on heparin gtt bridge with Coumadin 2. Seen by Vascular Surgery, considered not candidate for IVC filter 3. INR up to 1.8 4. This morning, patient reported some hematuria which was new. 5. Discussed with hematology.  As patient is nearly therapeutic on Coumadin, recommendation to hold further heparin at this time.  Have notified pharmacy 3. Depression 1. Seems to be stable at this time 4. GERD 1. Currently stable at this time 5. Morbid obesity 1. Recommend diet and lifestyle modification 6. Chronic anticoagulation 1. Per above 7. R thigh pain 1. Pt reports intermittent R thigh pain and swelling 2. Right thigh soft tissue ultrasound ordered and obtained, findings of 11 mm fluid collection, likely hematoma 3. Recommend supportive measures such as heat pack to the area  DVT prophylaxis: Heparin bridge, coumadin Code Status: Full Family Communication: Pt in room, family at bedside  Status is: Inpatient  Remains inpatient appropriate because:Unsafe d/c plan, IV treatments appropriate due to intensity of illness or inability to take PO and Inpatient level of care appropriate due to severity of illness   Dispo: The patient is from: Home              Anticipated d/c is to: Home               Anticipated d/c date is: > 3 days              Patient currently is not medically stable to d/c.   Consultants:   Critical care  Hematology  Vascular Surgery  Procedures:     Antimicrobials: Anti-infectives (From admission, onward)   None      Subjective: Complaining of some hematuria this morning  Objective: Vitals:   10/17/19 1256 10/17/19 2146 10/18/19 0440 10/18/19 1337  BP: 113/65 (!) 116/63 104/71 106/65  Pulse: 63 61 64 61  Resp: 17 15 15 16   Temp: 98.2 F (36.8 C) 98.1 F (36.7 C) 98.1 F (36.7 C) 97.6 F (36.4 C)  TempSrc: Oral Oral Oral Oral  SpO2: 96% 97% 100% 98%  Weight:   (!) 105.4 kg   Height:        Intake/Output Summary (Last 24 hours) at 10/18/2019 1636 Last data filed at 10/18/2019 1200 Gross per 24 hour  Intake 1060 ml  Output 200 ml  Net 860 ml   Filed Weights   10/14/19 1800 10/15/19 0525 10/18/19 0440  Weight: (!) 106.7 kg (!) 108.6 kg (!) 105.4 kg    Examination: General exam: Awake, laying in bed, in nad Respiratory system: Normal respiratory effort, no wheezing Cardiovascular system: regular rate, s1, s2 Gastrointestinal system: Soft, nondistended, positive BS Central nervous system: CN2-12 grossly intact, strength intact Extremities: Perfused, no clubbing Skin: Normal skin turgor, no notable skin lesions seen Psychiatry: Mood normal // no  visual hallucinations   Data Reviewed: I have personally reviewed following labs and imaging studies  CBC: Recent Labs  Lab 10/13/19 0449 10/13/19 1629 10/14/19 1559 10/15/19 0544 10/16/19 0506 10/17/19 0201 10/18/19 0227  WBC 13.2*   < > 24.6* 20.0* 21.7* 22.2* 23.6*  NEUTROABS 8.9*  --   --   --   --   --   --   HGB 11.4*   < > 9.7* 9.6* 10.5* 10.7* 11.3*  HCT 35.2*   < > 30.8* 31.4* 32.5* 33.5* 35.1*  MCV 89.3   < > 89.0 90.8 89.0 87.9 88.6  PLT <5*   < > 62* 76* 77* 228 213   < > = values in this interval not displayed.   Basic Metabolic Panel: Recent Labs    Lab 10/13/19 0449 10/15/19 0544  NA 138 132*  K 3.4* 4.4  CL 105 106  CO2 24 21*  GLUCOSE 140* 122*  BUN 10 17  CREATININE 0.65 0.80  CALCIUM 9.0 8.7*   GFR: Estimated Creatinine Clearance: 92.5 mL/min (by C-G formula based on SCr of 0.8 mg/dL). Liver Function Tests: Recent Labs  Lab 10/13/19 0449  AST 41  ALT 36  ALKPHOS 55  BILITOT 0.3  PROT 8.0  ALBUMIN 3.3*   No results for input(s): LIPASE, AMYLASE in the last 168 hours. No results for input(s): AMMONIA in the last 168 hours. Coagulation Profile: Recent Labs  Lab 10/13/19 0449 10/15/19 0544 10/16/19 0506 10/17/19 0201 10/18/19 0227  INR 1.5* 1.8* 1.4* 1.6* 1.8*   Cardiac Enzymes: No results for input(s): CKTOTAL, CKMB, CKMBINDEX, TROPONINI in the last 168 hours. BNP (last 3 results) No results for input(s): PROBNP in the last 8760 hours. HbA1C: No results for input(s): HGBA1C in the last 72 hours. CBG: Recent Labs  Lab 10/17/19 0514  GLUCAP 179*   Lipid Profile: No results for input(s): CHOL, HDL, LDLCALC, TRIG, CHOLHDL, LDLDIRECT in the last 72 hours. Thyroid Function Tests: No results for input(s): TSH, T4TOTAL, FREET4, T3FREE, THYROIDAB in the last 72 hours. Anemia Panel: No results for input(s): VITAMINB12, FOLATE, FERRITIN, TIBC, IRON, RETICCTPCT in the last 72 hours. Sepsis Labs: No results for input(s): PROCALCITON, LATICACIDVEN in the last 168 hours.  Recent Results (from the past 240 hour(s))  SARS Coronavirus 2 by RT PCR (hospital order, performed in [facility] hospital lab) Nasopharyngeal Nasopharyngeal Swab     Status: None   Collection Time: 10/13/19  9:25 AM   Specimen: Nasopharyngeal Swab  Result Value Ref Range Status   SARS Coronavirus 2 NEGATIVE NEGATIVE Final    Comment: (NOTE) SARS-CoV-2 target nucleic acids are NOT DETECTED.  The SARS-CoV-2 RNA is generally detectable in upper and lower respiratory specimens during the acute phase of infection. The lowest concentration  of SARS-CoV-2 viral copies this assay can detect is 250 copies / mL. A negative result does not preclude SARS-CoV-2 infection and should not be used as the sole basis for treatment or other patient management decisions.  A negative result may occur with improper specimen collection / handling, submission of specimen other than nasopharyngeal swab, presence of viral mutation(s) within the areas targeted by this assay, and inadequate number of viral copies (<250 copies / mL). A negative result must be combined with clinical observations, patient history, and epidemiological information.  Fact Sheet for Patients:   BoilerBrush.com.cy  Fact Sheet for Healthcare Providers: https://pope.com/  This test is not yet approved or  cleared by the [place] FDA and has been  authorized for detection and/or diagnosis of SARS-CoV-2 by FDA under an Emergency Use Authorization (EUA).  This EUA will remain in effect (meaning this test can be used) for the duration of the COVID-19 declaration under Section 564(b)(1) of the Act, 21 U.S.C. section 360bbb-3(b)(1), unless the authorization is terminated or revoked sooner.  Performed at [facility] Lab, [address] [place], [place] [address]   Urine culture     Status: Abnormal   Collection Time: 10/13/19  3:25 PM   Specimen: Urine, Random  Result Value Ref Range Status   Specimen Description URINE, RANDOM  Final   Special Requests   Final    NONE Performed at [facility], [address], [place], [place] [address]    Culture MULTIPLE SPECIES PRESENT, SUGGEST RECOLLECTION (A)  Final   Report Status 10/15/2019 FINAL  Final     Radiology Studies: [place] RT LOWER EXTREM LTD SOFT TISSUE NON VASCULAR  Result Date: 10/17/2019 CLINICAL DATA:  Right thigh swelling with bruising EXAM: ULTRASOUND right LOWER EXTREMITY LIMITED TECHNIQUE: Ultrasound examination of the lower extremity soft tissues was  performed in the area of clinical concern. COMPARISON:  None. FINDINGS: Targeted ultrasound of the right lateral thigh in the area of swelling is performed. Within the superficial soft tissues, there is a mostly echogenic mass measuring 9 x 9 x 11 mm with a focal cystic component containing internal echoes. No significant internal flow. IMPRESSION: 11 mm mostly hyperechoic mass within the superficial soft tissues in the right lateral thigh in the region of swelling, the sonographic features are suggestive of a soft tissue hematoma Electronically Signed   By: Jasmine Pang M.D.   On: 10/17/2019 16:59    Scheduled Meds: . Chlorhexidine Gluconate Cloth  6 each Topical Daily  . pantoprazole  40 mg Oral Daily  . sodium chloride flush  3 mL Intravenous Once  . Warfarin - Pharmacist Dosing Inpatient   Does not apply q1600   Continuous Infusions: . sodium chloride    . famotidine (PEPCID) IV (ONCOLOGY)       LOS: 5 days   Rickey Barbara, MD [facility] Pager On Amion  If 7PM-7AM, please contact night-coverage 10/18/2019, 4:36 PM

## 2019-10-18 NOTE — Progress Notes (Signed)
Patient noted small amount of blood in urine. Vital signs stable. Will continue to monitor.

## 2019-10-18 NOTE — Progress Notes (Signed)
Heparin infusion stopped as ordered

## 2019-10-18 NOTE — Progress Notes (Signed)
[name]   DOB:1975/06/17   NO#:[id number]   SJG#:[id number]  Hematology follow up   Subjective: pt started vaginal bleeding last night, it is light, still presents today, she has changed her pad twice today.  No significant blood clots.  She also noticed blood in the urine this afternoon, no blood clots, no gum or other bleeding. Her last period was 3 weeks ago, so her vaginal bleeding is 1 week earlier than her regular menstrual.,  And she does not have usual abdominal cramps,   Objective:  Vitals:   10/18/19 0440 10/18/19 1337  BP: 104/71 106/65  Pulse: 64 61  Resp: 15 16  Temp: 98.1 F (36.7 C) 97.6 F (36.4 C)  SpO2: 100% 98%    Body mass index is 50.29 kg/m.  Intake/Output Summary (Last 24 hours) at 10/18/2019 1651 Last data filed at 10/18/2019 1200 Gross per 24 hour  Intake 1060 ml  Output 200 ml  Net 860 ml     Sclerae unicteric  Oropharynx no bleeding noted  No peripheral adenopathy  Lungs clear -- no rales or rhonchi  Heart regular rate and rhythm  Abdomen benign  MSK: Swelling in the lateral aspect of her right upper leg, small bruise noted slightly above this area, no discrete mass, reports tenderness to touch, stable  CBG (last 3)  Recent Labs    10/17/19 0514  GLUCAP 179*     Labs:  Urine Studies No results for input(s): UHGB, CRYS in the last 72 hours.  Invalid input(s): UACOL, UAPR, USPG, UPH, UTP, UGL, UKET, UBIL, UNIT, UROB, ULEU, UEPI, UWBC, URBC, UBAC, CAST, UCOM, BILUA  Basic Metabolic Panel: Recent Labs  Lab 10/13/19 0449 10/15/19 0544  NA 138 132*  K 3.4* 4.4  CL 105 106  CO2 24 21*  GLUCOSE 140* 122*  BUN 10 17  CREATININE 0.65 0.80  CALCIUM 9.0 8.7*   GFR Estimated Creatinine Clearance: 92.5 mL/min (by C-G formula based on SCr of 0.8 mg/dL). Liver Function Tests: Recent Labs  Lab 10/13/19 0449  AST 41  ALT 36  ALKPHOS 55  BILITOT 0.3  PROT 8.0  ALBUMIN 3.3*   No results for input(s): LIPASE, AMYLASE in the  last 168 hours. No results for input(s): AMMONIA in the last 168 hours. Coagulation profile Recent Labs  Lab 10/13/19 0449 10/15/19 0544 10/16/19 0506 10/17/19 0201 10/18/19 0227  INR 1.5* 1.8* 1.4* 1.6* 1.8*    CBC: Recent Labs  Lab 10/13/19 0449 10/13/19 1629 10/14/19 1559 10/15/19 0544 10/16/19 0506 10/17/19 0201 10/18/19 0227  WBC 13.2*   < > 24.6* 20.0* 21.7* 22.2* 23.6*  NEUTROABS 8.9*  --   --   --   --   --   --   HGB 11.4*   < > 9.7* 9.6* 10.5* 10.7* 11.3*  HCT 35.2*   < > 30.8* 31.4* 32.5* 33.5* 35.1*  MCV 89.3   < > 89.0 90.8 89.0 87.9 88.6  PLT <5*   < > 62* 76* 77* 228 213   < > = values in this interval not displayed.   Cardiac Enzymes: No results for input(s): CKTOTAL, CKMB, CKMBINDEX, TROPONINI in the last 168 hours. BNP: Invalid input(s): POCBNP CBG: Recent Labs  Lab 10/17/19 0514  GLUCAP 179*   D-Dimer No results for input(s): DDIMER in the last 72 hours. Hgb A1c No results for input(s): HGBA1C in the last 72 hours. Lipid Profile No results for input(s): CHOL, HDL, LDLCALC, TRIG, CHOLHDL, LDLDIRECT in the last 72  hours. Thyroid function studies No results for input(s): TSH, T4TOTAL, T3FREE, THYROIDAB in the last 72 hours.  Invalid input(s): FREET3 Anemia work up No results for input(s): VITAMINB12, FOLATE, FERRITIN, TIBC, IRON, RETICCTPCT in the last 72 hours. Microbiology Recent Results (from the past 240 hour(s))  SARS Coronavirus 2 by RT PCR (hospital order, performed in [facility] hospital lab) Nasopharyngeal Nasopharyngeal Swab     Status: None   Collection Time: 10/13/19  9:25 AM   Specimen: Nasopharyngeal Swab  Result Value Ref Range Status   SARS Coronavirus 2 NEGATIVE NEGATIVE Final    Comment: (NOTE) SARS-CoV-2 target nucleic acids are NOT DETECTED.  The SARS-CoV-2 RNA is generally detectable in upper and lower respiratory specimens during the acute phase of infection. The lowest concentration of SARS-CoV-2 viral copies  this assay can detect is 250 copies / mL. A negative result does not preclude SARS-CoV-2 infection and should not be used as the sole basis for treatment or other patient management decisions.  A negative result may occur with improper specimen collection / handling, submission of specimen other than nasopharyngeal swab, presence of viral mutation(s) within the areas targeted by this assay, and inadequate number of viral copies (<250 copies / mL). A negative result must be combined with clinical observations, patient history, and epidemiological information.  Fact Sheet for Patients:   BoilerBrush.com.cy  Fact Sheet for Healthcare Providers: https://pope.com/  This test is not yet approved or  cleared by the [place] FDA and has been authorized for detection and/or diagnosis of SARS-CoV-2 by FDA under an Emergency Use Authorization (EUA).  This EUA will remain in effect (meaning this test can be used) for the duration of the COVID-19 declaration under Section 564(b)(1) of the Act, 21 U.S.C. section 360bbb-3(b)(1), unless the authorization is terminated or revoked sooner.  Performed at [facility], [address], [place], [place] [address]   Urine culture     Status: Abnormal   Collection Time: 10/13/19  3:25 PM   Specimen: Urine, Random  Result Value Ref Range Status   Specimen Description URINE, RANDOM  Final   Special Requests   Final    NONE Performed at [facility], [address], [place], [place] [address]    Culture MULTIPLE SPECIES PRESENT, SUGGEST RECOLLECTION (A)  Final   Report Status 10/15/2019 FINAL  Final      Studies:  [place] RT LOWER EXTREM LTD SOFT TISSUE NON VASCULAR  Result Date: 10/17/2019 CLINICAL DATA:  Right thigh swelling with bruising EXAM: ULTRASOUND right LOWER EXTREMITY LIMITED TECHNIQUE: Ultrasound examination of the lower extremity soft tissues was performed in the area of  clinical concern. COMPARISON:  None. FINDINGS: Targeted ultrasound of the right lateral thigh in the area of swelling is performed. Within the superficial soft tissues, there is a mostly echogenic mass measuring 9 x 9 x 11 mm with a focal cystic component containing internal echoes. No significant internal flow. IMPRESSION: 11 mm mostly hyperechoic mass within the superficial soft tissues in the right lateral thigh in the region of swelling, the sonographic features are suggestive of a soft tissue hematoma Electronically Signed   By: Jasmine Pang M.D.   On: 10/17/2019 16:59    Assessment: 44 y.o. female   1.  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  Submassive bilateral PE  3.  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  History of mild subarachnoid hemorrhage 5.  Obesity 6. Chronic headaches and dizziness  7. Depression  8.  Right upper leg swelling, small hematoma 9. Vaginal bleeding and hematuria   Plan:  -Platelets remains to be normal this morning, and hemoglobin also trending up. -Her vaginal bleeding and hematuria is likely related to her double anticoagulation, her INR was 1.8 this morning. -I discussed with Dr. Rhona Leavens this morning, will stop heparin drip, due to her mild bleeding, and IR close to therapeutic. -However I will continue Coumadin, dosed per pharmacy -since today is her 3rd dose of coumadin, ideally I would like her to have more bridging heparin. If bleeding stops tonight, may restart heparin tomorrow morning -we discussed that her menstrual period will be heavy when she is on blood thinner, I recommend her follow-up with her GYN.  Oral birth control pill would be contraindicated due to her risk of thrombosis.  She does not plan to have more children, endometrial ablation may be considered. -We also reviewed diet restriction when she is  on Coumadin -will check UA and urine culture also to rule out UTI.  Malachy Mood, MD 10/18/2019  4:51 PM

## 2019-10-18 NOTE — Plan of Care (Signed)
  Problem: Education: Goal: Knowledge of General Education information will improve Description Including pain rating scale, medication(s)/side effects and non-pharmacologic comfort measures Outcome: Progressing   

## 2019-10-18 NOTE — Progress Notes (Addendum)
ANTICOAGULATION CONSULT NOTE  Pharmacy Consult for Heparin >> Warfarin Indication: pulmonary embolus  No Known Allergies  Patient Measurements: Height: 4\' 9"  (144.8 cm) Weight: (!) 105.4 kg (232 lb 6.4 oz) IBW/kg (Calculated) : 38.6 Heparin Dosing Weight: 73.3 kg  Vital Signs: Temp: 98.1 F (36.7 C) (07/31 0440) Temp Source: Oral (07/31 0440) BP: 104/71 (07/31 0440) Pulse Rate: 64 (07/31 0440)  Labs: Recent Labs    10/16/19 0506 10/16/19 0506 10/16/19 1159 10/17/19 0201 10/17/19 0622 10/18/19 0227  HGB 10.5*   < >  --  10.7*  --  11.3*  HCT 32.5*  --   --  33.5*  --  35.1*  PLT 77*  --   --  228  --  213  LABPROT 16.8*  --   --  18.5*  --  20.4*  INR 1.4*  --   --  1.6*  --  1.8*  HEPARINUNFRC 0.50   < > 0.50  --  0.37 0.33   < > = values in this interval not displayed.    Assessment: 44 year old female that presented with at least submassive PE. Patient has a history of ITP. Pt takes warfarin 5 mg daily for DVT (last dose was on 7/25 at 1300, per med rec); INR was 1.5 on admission. Patient had left iliac vein stenting on 7/21. Pharmacy was consulted on 7/28 to restart warfarin.  7/27 Dopplers:  R: no evidence of DVT L: age-indeterminate DVT involving L peroneal veins, L posterior tibial veins; chronic DVT involving L femoral vein  H/H improved today to 11.3/35.1 from 10.7/33.5. Received warfarin 7.5 mg yesterday. INR slowly rising but remains subtherapeutic at 1.8. Heparin level remains therapeutic at 0.33. No further bleeding from gums. Patient reported vaginal bleeding observed in toilet, however, overnight RN unable to witness prior to patient flushing; no further bleeding reported since. Will monitor closely.   Goal of Therapy:  Heparin level 0.3-0.5 units/ml (no bolus) (lower goal, due to low plt count) INR goal: 2-3 Monitor platelets by anticoagulation protocol: Yes   Plan:  - Warfarin 7.5 mg PO x1 this evening. - Cont heparin 450 units/hr - Monitor daily  heparin level, INR, CBC - Monitor for signs/symptoms of bleeding - Follow-up LLE 08-26-2004     Shajuan Musso L. [place], PharmD [facility] PGY2 Pharmacy Resident [phone] 10/18/19      8:36 AM  Please check AMION for all [facility] Pharmacy phone numbers After 10:00 PM, call the Main Pharmacy [phone]    -------------------------------------------------------------- 10/18/2019 1332 Addendum Plan: - Warfarin 7.5 mg PO x1 this evening - Stop heparin infusion due to hematuria - Daily INR, CBC - Closely monitor for overt bleeding     Asad Keeven L. 10/20/2019, PharmD [facility] PGY2 Pharmacy Resident [phone] 10/18/19      1:33 PM  Please check AMION for all [facility] Pharmacy phone numbers After 10:00 PM, call the Main Pharmacy [phone]

## 2019-10-19 DIAGNOSIS — F329 Major depressive disorder, single episode, unspecified: Secondary | ICD-10-CM

## 2019-10-19 LAB — URINE CULTURE

## 2019-10-19 LAB — CBC
HCT: 37.7 % (ref 36.0–46.0)
Hemoglobin: 12.3 g/dL (ref 12.0–15.0)
MCH: 28.8 pg (ref 26.0–34.0)
MCHC: 32.6 g/dL (ref 30.0–36.0)
MCV: 88.3 fL (ref 80.0–100.0)
Platelets: 265 10*3/uL (ref 150–400)
RBC: 4.27 MIL/uL (ref 3.87–5.11)
RDW: 15.5 % (ref 11.5–15.5)
WBC: 14.5 10*3/uL — ABNORMAL HIGH (ref 4.0–10.5)
nRBC: 5.3 % — ABNORMAL HIGH (ref 0.0–0.2)

## 2019-10-19 LAB — PROTIME-INR
INR: 2 — ABNORMAL HIGH (ref 0.8–1.2)
Prothrombin Time: 21.9 seconds — ABNORMAL HIGH (ref 11.4–15.2)

## 2019-10-19 LAB — HEPATITIS B SURFACE ANTIBODY, QUANTITATIVE: Hep B S AB Quant (Post): UNDETERMINED m[IU]/mL

## 2019-10-19 MED ORDER — DULOXETINE HCL 20 MG PO CPEP
20.0000 mg | ORAL_CAPSULE | Freq: Every day | ORAL | Status: DC
  Administered 2019-10-19 – 2019-10-20 (×2): 20 mg via ORAL
  Filled 2019-10-19 (×2): qty 1

## 2019-10-19 MED ORDER — HYDROXYZINE HCL 10 MG PO TABS
10.0000 mg | ORAL_TABLET | Freq: Three times a day (TID) | ORAL | Status: DC | PRN
  Administered 2019-10-19: 10 mg via ORAL
  Filled 2019-10-19 (×3): qty 1

## 2019-10-19 MED ORDER — WARFARIN SODIUM 7.5 MG PO TABS
7.5000 mg | ORAL_TABLET | Freq: Once | ORAL | Status: AC
  Administered 2019-10-19: 7.5 mg via ORAL
  Filled 2019-10-19: qty 1

## 2019-10-19 NOTE — Progress Notes (Signed)
PROGRESS NOTE    [name]  FSF:[id number] DOB: November 04, 1975 DOA: 10/13/2019 PCP: Malachy Mood, MD    Brief Narrative:  [id number] with hx chronic ITP who was recently admitted for DVT on coumadin presents with bleeding gums and CT findings of PE. Pt was initially admitted to critical care service for closer monitorin  Assessment & Plan:   Active Problems:   Thrombocytopenia (HCC)   Acute pulmonary embolism (HCC)  1. ITP with thrombocytopenia 1. Hematology following 2. Pt completed 4 days of IV steroids 3. Patient was given Rutuximab for refractory ITP per Hematology 4. Plts continue to improve this morning 2. DVT/PE 1. Now off heparin drip bridge and continued on Coumadin 2. Seen by Vascular Surgery, considered not candidate for IVC filter 3. INR up to 2.0 today 4. Recently noted to have complaints of hematuria, determined to be more vaginal bleeding related 5. Patient already established with OB/GYN as outpatient however she has not scheduled appointment yet.  Have suggested patient contact her gastroenterologist for outpatient follow-up 6. Hemoglobin up to 12.3 this morning 3. Depression 1. Seems to be stable at this time patient was somewhat anxious and tearful earlier in the morning.  As needed Vistaril ordered 4. GERD 1. Currently stable at this time 5. Morbid obesity 1. Recommend diet and lifestyle modification 6. Chronic anticoagulation 1. Continue per above 7. R thigh pain 1. Pt recently reported intermittent R thigh pain and swelling 2. Right thigh soft tissue ultrasound ordered and obtained, findings of 11 mm fluid collection, likely hematoma 3. Recommend supportive measures such as heat pack to the area as well as ambulation  DVT prophylaxis: Heparin bridge, coumadin Code Status: Full Family Communication: Pt in room, family at bedside  Status is: Inpatient  Remains inpatient appropriate because:Unsafe d/c plan, IV treatments appropriate due to intensity  of illness or inability to take PO and Inpatient level of care appropriate due to severity of illness   Dispo: The patient is from: Home              Anticipated d/c is to: Home              Anticipated d/c date is: > 3 days              Patient currently is not medically stable to d/c.   Consultants:   Critical care  Hematology  Vascular Surgery  Procedures:     Antimicrobials: Anti-infectives (From admission, onward)   None      Subjective: In good spirits when seen at bedside.  Earlier, nurse reported patient seemed anxious and tearful  Objective: Vitals:   10/18/19 2058 10/19/19 0519 10/19/19 0557 10/19/19 1500  BP: (!) 95/51 99/68  95/65  Pulse: 58 61  66  Resp: 20 17  16   Temp: 98.6 F (37 C) 98.1 F (36.7 C)  97.8 F (36.6 C)  TempSrc: Oral Oral  Oral  SpO2: 99% 99%  97%  Weight:   (!) 113.2 kg   Height:        Intake/Output Summary (Last 24 hours) at 10/19/2019 1553 Last data filed at 10/19/2019 1200 Gross per 24 hour  Intake 385 ml  Output --  Net 385 ml   Filed Weights   10/15/19 0525 10/18/19 0440 10/19/19 0557  Weight: (!) 108.6 kg (!) 105.4 kg (!) 113.2 kg    Examination: General exam: Conversant, in no acute distress Respiratory system: normal chest rise, clear, no audible wheezing Cardiovascular system: regular rhythm, s1-s2 Gastrointestinal  system: Nondistended, nontender, pos BS Central nervous system: No seizures, no tremors Extremities: No cyanosis, no joint deformities Skin: No rashes, no pallor Psychiatry: Affect normal // no auditory hallucinations   Data Reviewed: I have personally reviewed following labs and imaging studies  CBC: Recent Labs  Lab 10/13/19 0449 10/13/19 1629 10/15/19 0544 10/16/19 0506 10/17/19 0201 10/18/19 0227 10/19/19 0326  WBC 13.2*   < > 20.0* 21.7* 22.2* 23.6* 14.5*  NEUTROABS 8.9*  --   --   --   --   --   --   HGB 11.4*   < > 9.6* 10.5* 10.7* 11.3* 12.3  HCT 35.2*   < > 31.4* 32.5* 33.5*  35.1* 37.7  MCV 89.3   < > 90.8 89.0 87.9 88.6 88.3  PLT <5*   < > 76* 77* 228 213 265   < > = values in this interval not displayed.   Basic Metabolic Panel: Recent Labs  Lab 10/13/19 0449 10/15/19 0544  NA 138 132*  K 3.4* 4.4  CL 105 106  CO2 24 21*  GLUCOSE 140* 122*  BUN 10 17  CREATININE 0.65 0.80  CALCIUM 9.0 8.7*   GFR: Estimated Creatinine Clearance: 95.9 mL/min (by C-G formula based on SCr of 0.8 mg/dL). Liver Function Tests: Recent Labs  Lab 10/13/19 0449  AST 41  ALT 36  ALKPHOS 55  BILITOT 0.3  PROT 8.0  ALBUMIN 3.3*   No results for input(s): LIPASE, AMYLASE in the last 168 hours. No results for input(s): AMMONIA in the last 168 hours. Coagulation Profile: Recent Labs  Lab 10/15/19 0544 10/16/19 0506 10/17/19 0201 10/18/19 0227 10/19/19 0326  INR 1.8* 1.4* 1.6* 1.8* 2.0*   Cardiac Enzymes: No results for input(s): CKTOTAL, CKMB, CKMBINDEX, TROPONINI in the last 168 hours. BNP (last 3 results) No results for input(s): PROBNP in the last 8760 hours. HbA1C: No results for input(s): HGBA1C in the last 72 hours. CBG: Recent Labs  Lab 10/17/19 0514  GLUCAP 179*   Lipid Profile: No results for input(s): CHOL, HDL, LDLCALC, TRIG, CHOLHDL, LDLDIRECT in the last 72 hours. Thyroid Function Tests: No results for input(s): TSH, T4TOTAL, FREET4, T3FREE, THYROIDAB in the last 72 hours. Anemia Panel: No results for input(s): VITAMINB12, FOLATE, FERRITIN, TIBC, IRON, RETICCTPCT in the last 72 hours. Sepsis Labs: No results for input(s): PROCALCITON, LATICACIDVEN in the last 168 hours.  Recent Results (from the past 240 hour(s))  SARS Coronavirus 2 by RT PCR (hospital order, performed in [facility] hospital lab) Nasopharyngeal Nasopharyngeal Swab     Status: None   Collection Time: 10/13/19  9:25 AM   Specimen: Nasopharyngeal Swab  Result Value Ref Range Status   SARS Coronavirus 2 NEGATIVE NEGATIVE Final    Comment: (NOTE) SARS-CoV-2 target  nucleic acids are NOT DETECTED.  The SARS-CoV-2 RNA is generally detectable in upper and lower respiratory specimens during the acute phase of infection. The lowest concentration of SARS-CoV-2 viral copies this assay can detect is 250 copies / mL. A negative result does not preclude SARS-CoV-2 infection and should not be used as the sole basis for treatment or other patient management decisions.  A negative result may occur with improper specimen collection / handling, submission of specimen other than nasopharyngeal swab, presence of viral mutation(s) within the areas targeted by this assay, and inadequate number of viral copies (<250 copies / mL). A negative result must be combined with clinical observations, patient history, and epidemiological information.  Fact Sheet for Patients:  BoilerBrush.com.cy  Fact Sheet for Healthcare Providers: https://pope.com/  This test is not yet approved or  cleared by the [place] FDA and has been authorized for detection and/or diagnosis of SARS-CoV-2 by FDA under an Emergency Use Authorization (EUA).  This EUA will remain in effect (meaning this test can be used) for the duration of the COVID-19 declaration under Section 564(b)(1) of the Act, 21 U.S.C. section 360bbb-3(b)(1), unless the authorization is terminated or revoked sooner.  Performed at [facility], [address] [place], [place] [address]   Urine culture     Status: Abnormal   Collection Time: 10/13/19  3:25 PM   Specimen: Urine, Random  Result Value Ref Range Status   Specimen Description URINE, RANDOM  Final   Special Requests   Final    NONE Performed at [facility], [address], [place], [place] [address]    Culture MULTIPLE SPECIES PRESENT, SUGGEST RECOLLECTION (A)  Final   Report Status 10/15/2019 FINAL  Final  Urine culture     Status: Abnormal   Collection Time: 10/18/19  6:17 PM   Specimen:  Urine, Random  Result Value Ref Range Status   Specimen Description URINE, RANDOM  Final   Special Requests   Final    NONE Performed at [facility], [address], [place], [place] [address]    Culture MULTIPLE SPECIES PRESENT, SUGGEST RECOLLECTION (A)  Final   Report Status 10/19/2019 FINAL  Final     Radiology Studies: No results found.  Scheduled Meds: . Chlorhexidine Gluconate Cloth  6 each Topical Daily  . DULoxetine  20 mg Oral Daily  . pantoprazole  40 mg Oral Daily  . sodium chloride flush  3 mL Intravenous Once  . warfarin  7.5 mg Oral ONCE-1600  . Warfarin - Pharmacist Dosing Inpatient   Does not apply q1600   Continuous Infusions: . sodium chloride    . famotidine (PEPCID) IV (ONCOLOGY)       LOS: 6 days   Rickey Barbara, MD [facility] Pager On Amion  If 7PM-7AM, please contact night-coverage 10/19/2019, 3:53 PM

## 2019-10-19 NOTE — Progress Notes (Signed)
[name]   DOB:1975/10/26   FA#:[id number]   ION#:[id number]  Hematology follow up   Subjective: pt still has vaginal bleeding, similar to her usual menstrual period.  Her urine is still bloody but she feels it is from her vaginal bleeding.  No dysuria.  She still complains of right thigh discomfort, she states is not pain, but she feels numbness tingling in her right thigh and it  Moves inside her thigh. She ambulates without difficulty.  No other bleeding.  Objective:  Vitals:   10/18/19 2058 10/19/19 0519  BP: (!) 95/51 99/68  Pulse: 58 61  Resp: 20 17  Temp: 98.6 F (37 C) 98.1 F (36.7 C)  SpO2: 99% 99%    Body mass index is 54 kg/m.  Intake/Output Summary (Last 24 hours) at 10/19/2019 1334 Last data filed at 10/19/2019 1200 Gross per 24 hour  Intake 385 ml  Output --  Net 385 ml     Sclerae unicteric  Oropharynx no bleeding noted  No peripheral adenopathy  Lungs clear -- no rales or rhonchi  Heart regular rate and rhythm  Abdomen benign  MSK: Swelling in the lateral aspect of her right upper leg, small bruise noted slightly above this area, no discrete mass, reports tenderness to touch, stable  CBG (last 3)  Recent Labs    10/17/19 0514  GLUCAP 179*     Labs:  Urine Studies No results for input(s): UHGB, CRYS in the last 72 hours.  Invalid input(s): UACOL, UAPR, USPG, UPH, UTP, UGL, UKET, UBIL, UNIT, UROB, ULEU, UEPI, UWBC, URBC, UBAC, CAST, UCOM, BILUA  Basic Metabolic Panel: Recent Labs  Lab 10/13/19 0449 10/15/19 0544  NA 138 132*  K 3.4* 4.4  CL 105 106  CO2 24 21*  GLUCOSE 140* 122*  BUN 10 17  CREATININE 0.65 0.80  CALCIUM 9.0 8.7*   GFR Estimated Creatinine Clearance: 95.9 mL/min (by C-G formula based on SCr of 0.8 mg/dL). Liver Function Tests: Recent Labs  Lab 10/13/19 0449  AST 41  ALT 36  ALKPHOS 55  BILITOT 0.3  PROT 8.0  ALBUMIN 3.3*   No results for input(s): LIPASE, AMYLASE in the last 168 hours. No results for  input(s): AMMONIA in the last 168 hours. Coagulation profile Recent Labs  Lab 10/15/19 0544 10/16/19 0506 10/17/19 0201 10/18/19 0227 10/19/19 0326  INR 1.8* 1.4* 1.6* 1.8* 2.0*    CBC: Recent Labs  Lab 10/13/19 0449 10/13/19 1629 10/15/19 0544 10/16/19 0506 10/17/19 0201 10/18/19 0227 10/19/19 0326  WBC 13.2*   < > 20.0* 21.7* 22.2* 23.6* 14.5*  NEUTROABS 8.9*  --   --   --   --   --   --   HGB 11.4*   < > 9.6* 10.5* 10.7* 11.3* 12.3  HCT 35.2*   < > 31.4* 32.5* 33.5* 35.1* 37.7  MCV 89.3   < > 90.8 89.0 87.9 88.6 88.3  PLT <5*   < > 76* 77* 228 213 265   < > = values in this interval not displayed.   Cardiac Enzymes: No results for input(s): CKTOTAL, CKMB, CKMBINDEX, TROPONINI in the last 168 hours. BNP: Invalid input(s): POCBNP CBG: Recent Labs  Lab 10/17/19 0514  GLUCAP 179*   D-Dimer No results for input(s): DDIMER in the last 72 hours. Hgb A1c No results for input(s): HGBA1C in the last 72 hours. Lipid Profile No results for input(s): CHOL, HDL, LDLCALC, TRIG, CHOLHDL, LDLDIRECT in the last 72 hours. Thyroid function studies No  results for input(s): TSH, T4TOTAL, T3FREE, THYROIDAB in the last 72 hours.  Invalid input(s): FREET3 Anemia work up No results for input(s): VITAMINB12, FOLATE, FERRITIN, TIBC, IRON, RETICCTPCT in the last 72 hours. Microbiology Recent Results (from the past 240 hour(s))  SARS Coronavirus 2 by RT PCR (hospital order, performed in [facility] hospital lab) Nasopharyngeal Nasopharyngeal Swab     Status: None   Collection Time: 10/13/19  9:25 AM   Specimen: Nasopharyngeal Swab  Result Value Ref Range Status   SARS Coronavirus 2 NEGATIVE NEGATIVE Final    Comment: (NOTE) SARS-CoV-2 target nucleic acids are NOT DETECTED.  The SARS-CoV-2 RNA is generally detectable in upper and lower respiratory specimens during the acute phase of infection. The lowest concentration of SARS-CoV-2 viral copies this assay can detect is 250 copies  / mL. A negative result does not preclude SARS-CoV-2 infection and should not be used as the sole basis for treatment or other patient management decisions.  A negative result may occur with improper specimen collection / handling, submission of specimen other than nasopharyngeal swab, presence of viral mutation(s) within the areas targeted by this assay, and inadequate number of viral copies (<250 copies / mL). A negative result must be combined with clinical observations, patient history, and epidemiological information.  Fact Sheet for Patients:   BoilerBrush.com.cy  Fact Sheet for Healthcare Providers: https://pope.com/  This test is not yet approved or  cleared by the [place] FDA and has been authorized for detection and/or diagnosis of SARS-CoV-2 by FDA under an Emergency Use Authorization (EUA).  This EUA will remain in effect (meaning this test can be used) for the duration of the COVID-19 declaration under Section 564(b)(1) of the Act, 21 U.S.C. section 360bbb-3(b)(1), unless the authorization is terminated or revoked sooner.  Performed at [facility], [address] [place], [place] [address]   Urine culture     Status: Abnormal   Collection Time: 10/13/19  3:25 PM   Specimen: Urine, Random  Result Value Ref Range Status   Specimen Description URINE, RANDOM  Final   Special Requests   Final    NONE Performed at [facility], [address] [place], [place] [address]    Culture MULTIPLE SPECIES PRESENT, SUGGEST RECOLLECTION (A)  Final   Report Status 10/15/2019 FINAL  Final  Urine culture     Status: Abnormal   Collection Time: 10/18/19  6:17 PM   Specimen: Urine, Random  Result Value Ref Range Status   Specimen Description URINE, RANDOM  Final   Special Requests   Final    NONE Performed at [facility], [address] [place], [place] [address]    Culture MULTIPLE SPECIES PRESENT,  SUGGEST RECOLLECTION (A)  Final   Report Status 10/19/2019 FINAL  Final      Studies:  [place] RT LOWER EXTREM LTD SOFT TISSUE NON VASCULAR  Result Date: 10/17/2019 CLINICAL DATA:  Right thigh swelling with bruising EXAM: ULTRASOUND right LOWER EXTREMITY LIMITED TECHNIQUE: Ultrasound examination of the lower extremity soft tissues was performed in the area of clinical concern. COMPARISON:  None. FINDINGS: Targeted ultrasound of the right lateral thigh in the area of swelling is performed. Within the superficial soft tissues, there is a mostly echogenic mass measuring 9 x 9 x 11 mm with a focal cystic component containing internal echoes. No significant internal flow. IMPRESSION: 11 mm mostly hyperechoic mass within the superficial soft tissues in the right lateral thigh in the region of  swelling, the sonographic features are suggestive of a soft tissue hematoma Electronically Signed   By: Jasmine Pang M.D.   On: 10/17/2019 16:59    Assessment: 44 y.o. female   1.  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  Submassive bilateral PE  3.  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  History of mild subarachnoid hemorrhage 5.  Obesity 6. Chronic headaches and dizziness  7. Depression  8.  Right upper leg swelling and neuropathy, small hematoma 9. Vaginal bleeding and hematuria   Plan:  -Lab reviewed, her platelet counts is 265 today, hemoglobin also back to normal.  INR 2.0 today.  We will continue Coumadin 7.5 daily -I recommended GYN evaluation since she will have heavy menstrual period from anticoagulation.  If inpatient evaluation is not available, will set up outpt evaluation.  -I will add Cymbalta 20 mg daily for her right thigh neuropathy, and her depression.  I reviewed the benefit and side effects, she agrees to try. -If no worsening bleeding, and her CBC  remained stable tomorrow morning, she can be discharged tomorrow morning -I plan to see her back with lab and second dose of Rituxan this Thursday  Malachy Mood, MD 10/19/2019  1:34 PM

## 2019-10-19 NOTE — Progress Notes (Signed)
ANTICOAGULATION CONSULT NOTE  Pharmacy Consult for Heparin >> Warfarin Indication: pulmonary embolus  No Known Allergies  Patient Measurements: Height: 4\' 9"  (144.8 cm) Weight: (!) 113.2 kg (249 lb 9 oz) IBW/kg (Calculated) : 38.6 Heparin Dosing Weight: 73.3 kg  Vital Signs: Temp: 98.1 F (36.7 C) (08/01 0519) Temp Source: Oral (08/01 0519) BP: 99/68 (08/01 0519) Pulse Rate: 61 (08/01 0519)  Labs: Recent Labs    10/16/19 1159 10/17/19 0201 10/17/19 0201 10/17/19 0622 10/18/19 0227 10/19/19 0326  HGB  --  10.7*   < >  --  11.3* 12.3  HCT  --  33.5*  --   --  35.1* 37.7  PLT  --  228  --   --  213 265  LABPROT  --  18.5*  --   --  20.4* 21.9*  INR  --  1.6*  --   --  1.8* 2.0*  HEPARINUNFRC 0.50  --   --  0.37 0.33  --    < > = values in this interval not displayed.    Assessment: 44 year old female that presented with at least submassive PE. Patient has a history of ITP. Pt takes warfarin 5 mg daily for DVT (last dose was on 7/25 at 1300, per med rec); INR was 1.5 on admission. Patient had left iliac vein stenting on 7/21. Pharmacy was consulted on 7/28 to restart warfarin.  7/27 Dopplers:  R: no evidence of DVT L: age-indeterminate DVT involving L peroneal veins, L posterior tibial veins; chronic DVT involving L femoral vein  Patient experienced hematuria 7/31, pharmacy asked to discontinue heparin drip and continue warfarin; heparin infusion off since 7/31 1330.   H/H improved today to 12.3/37.7 from. Received warfarin 7.5 mg yesterday. First therapeutic INR today at 2.0. No further bleeding from gums; RN reported hematuria overnight. Will continue to monitor closely.   Goal of Therapy:  INR goal: 2-3 Monitor platelets by anticoagulation protocol: Yes   Plan:  - Warfarin 7.5 mg PO x1 this evening. - Monitor daily INR, CBC - Monitor for signs/symptoms of bleeding - Follow-up LLE 11-06-1985      Mandell Pangborn L. [place], PharmD [facility] PGY2 Pharmacy Resident [phone] @TODAY @      8:23 AM  Please check AMION for all [facility] phone numbers After 10:00 PM, call the Main Pharmacy [phone]

## 2019-10-20 ENCOUNTER — Telehealth: Payer: Self-pay | Admitting: Hematology

## 2019-10-20 ENCOUNTER — Other Ambulatory Visit: Payer: Self-pay | Admitting: Oncology

## 2019-10-20 DIAGNOSIS — D693 Immune thrombocytopenic purpura: Secondary | ICD-10-CM

## 2019-10-20 DIAGNOSIS — I871 Compression of vein: Secondary | ICD-10-CM

## 2019-10-20 LAB — CBC
HCT: 37.1 % (ref 36.0–46.0)
Hemoglobin: 12 g/dL (ref 12.0–15.0)
MCH: 28.2 pg (ref 26.0–34.0)
MCHC: 32.3 g/dL (ref 30.0–36.0)
MCV: 87.3 fL (ref 80.0–100.0)
Platelets: 231 10*3/uL (ref 150–400)
RBC: 4.25 MIL/uL (ref 3.87–5.11)
RDW: 15.5 % (ref 11.5–15.5)
WBC: 8.6 10*3/uL (ref 4.0–10.5)
nRBC: 10.4 % — ABNORMAL HIGH (ref 0.0–0.2)

## 2019-10-20 LAB — HEPATITIS B SURFACE ANTIBODY, QUANTITATIVE: Hep B S AB Quant (Post): >1000 m[IU]/mL (ref >9.9)

## 2019-10-20 LAB — PROTIME-INR
INR: 2.2 — ABNORMAL HIGH (ref 0.8–1.2)
Prothrombin Time: 23.5 seconds — ABNORMAL HIGH (ref 11.4–15.2)

## 2019-10-20 MED ORDER — DULOXETINE HCL 20 MG PO CPEP: 20.0000 mg | ORAL_CAPSULE | Freq: Every day | ORAL | 0 refills | Status: DC

## 2019-10-20 MED ORDER — PANTOPRAZOLE SODIUM 40 MG PO TBEC: 40.0000 mg | DELAYED_RELEASE_TABLET | Freq: Every day | ORAL | 0 refills | Status: DC

## 2019-10-20 MED ORDER — WARFARIN SODIUM 7.5 MG PO TABS
7.5000 mg | ORAL_TABLET | Freq: Once | ORAL | Status: AC
  Administered 2019-10-20: 7.5 mg via ORAL
  Filled 2019-10-20: qty 1

## 2019-10-20 MED ORDER — WARFARIN SODIUM 5 MG PO TABS
ORAL_TABLET | ORAL | 2 refills | Status: DC
Start: 2019-10-20 — End: 2019-10-30

## 2019-10-20 MED FILL — PANTOPRAZOLE SOD DR 40 MG T: 40 | 30 days supply | Qty: 30 | Fill #0

## 2019-10-20 MED FILL — DULoxetine HCL 20 MG CPEP: 20 | 30 days supply | Qty: 30 | Fill #0

## 2019-10-20 NOTE — Discharge Summary (Signed)
Physician Discharge Summary  [name] ZOX:[id number] DOB: 28-Jun-1975 DOA: 10/13/2019  PCP: Malachy Mood, MD  Admit date: 10/13/2019 Discharge date: 10/20/2019  Admitted From: home Disposition:  home  Recommendations for Outpatient Follow-up:  1. Follow up with PCP in 1-2 weeks 2. Follow up with Dr. Mosetta Putt as scheduled 3. Follow up with GYN at earliest available appointment  Discharge Condition:Improved CODE STATUS:Full Diet recommendation: Regular   Brief/Interim Summary: 43yo with hx chronic ITP who was recently admitted for DVT on coumadin presents with bleeding gums and CT findings of PE. Pt was initially admitted to critical care service for closer monitorin  Discharge Diagnoses:  Active Problems:   Thrombocytopenia (HCC)   Acute pulmonary embolism (HCC)  1. ITP with thrombocytopenia 1. Hematology following 2. Pt completed 4 days of IV steroids 3. Patient was given Rutuximab for refractory ITP per Hematology 4. Plts now normalized 2. DVT/PE 1. Now off heparin drip bridge and continued on Coumadin 2. Seen by Vascular Surgery, considered not candidate for IVC filter 3. INR up to 2.2 today 4. Recently noted to have complaints of hematuria, determined to be more vaginal bleeding related 5. Patient already established with OB/GYN as outpatient however she has not scheduled appointment yet.  Recommend close follow up with Ob/GYN 6. Hgb remains stable at 12 7. Pharmacy recs for coumadin dose of 7.5mg  TuThSaSun and  MWF with INR follow up in 2-4 days 3. Depression 1. Seems to be stable at this time patient was somewhat anxious and tearful earlier in the morning.  As needed Vistaril was ordered this admit 4. GERD 1. Currently stable at this time 5. Morbid obesity 1. Recommend diet and lifestyle modification 6. Chronic anticoagulation 1. Continue anticoagulation per above 7. R thigh pain 1. Pt recently reported intermittent R thigh pain and swelling 2. Right  thigh soft tissue ultrasound ordered and obtained, findings of 11 mm fluid collection, likely hematoma 3. Recommend supportive measures such as heat pack to the area as well as ambulation   Discharge Instructions  Discharge Instructions    SCHEDULING COMMUNICATION   Complete by: As directed    Chemotherapy Appointment - 5.5 hr     Allergies as of 10/20/2019   No Known Allergies     Medication List    STOP taking these medications   aspirin 81 MG EC tablet   enoxaparin 150 MG/ML injection Commonly known as: LOVENOX     TAKE these medications   DULoxetine 20 MG capsule Commonly known as: CYMBALTA Take 1 capsule (20 mg total) by mouth daily. Start taking on: October 21, 2019   pantoprazole 40 MG tablet Commonly known as: PROTONIX Take 1 tablet (40 mg total) by mouth daily. Start taking on: October 21, 2019   warfarin 5 MG tablet Commonly known as: Coumadin Take 1 tablet (5 mg total) by mouth daily.       Follow-up Information    [facility]. Schedule an appointment as soon as possible for a visit.   Contact information: [address] [place] [address] [phone]       [facility]. Call.   Contact information: [address], [address] [id number] mc [place] [phone]       Malachy Mood, MD Follow up.   Specialties: Hematology, Oncology Why: as scheduled Contact information: [address] [place] [address] [phone]              No Known  Allergies  Consultations:  Hematology  PCCM  Procedures/Studies: DG Chest 2 View  Result Date: 10/07/2019 CLINICAL DATA:  44 year old female with left extremity pain. EXAM: CHEST - 2 VIEW COMPARISON:  Chest radiograph dated 04/20/2019. FINDINGS: There is borderline cardiomegaly with mild vascular congestion. No focal consolidation, pleural effusion, pneumothorax. No acute osseous  pathology. Top-normal caliber small bowel loop in the left upper abdomen measure up to 3 cm. IMPRESSION: Borderline cardiomegaly with mild vascular congestion. No focal consolidation. Electronically Signed   By: Elgie Collard M.D.   On: 10/07/2019 19:13   CT Head Wo Contrast  Result Date: 10/13/2019 CLINICAL DATA:  Headache, intracranial hemorrhage suspected. EXAM: CT HEAD WITHOUT CONTRAST TECHNIQUE: Contiguous axial images were obtained from the base of the skull through the vertex without intravenous contrast. COMPARISON:  Prior head CT examinations 10/07/2019 and earlier. FINDINGS: Brain: Cerebral volume is normal. There is no acute intracranial hemorrhage. No demarcated cortical infarct. No extra-axial fluid collection. No evidence of intracranial mass. No midline shift. Unchanged small scattered parenchymal calcifications. Redemonstrated partially empty sella turcica. Vascular: No hyperdense vessel. Skull: Normal. Negative for fracture or focal lesion. Sinuses/Orbits: Visualized orbits show no acute finding. Minimal ethmoid sinus mucosal thickening. No significant mastoid effusion. IMPRESSION: No CT evidence of acute intracranial abnormality. Redemonstrated partially empty sella turcica. This is very commonly an incidental finding, but can be associated with idiopathic intracranial hypertension. Clinical correlation is recommended. Unchanged small scattered nonspecific parenchymal calcifications. Mild ethmoid sinus mucosal thickening at the imaged levels. Electronically Signed   By: Jackey Loge DO   On: 10/13/2019 11:36   CT HEAD WO CONTRAST  Result Date: 10/07/2019 CLINICAL DATA:  Provided history: Possible stroke; transient ischemic attack (TIA). Additional history provided: Pain in left arm, leg, left side of body, numbness, swelling of left leg, headache. EXAM: CT HEAD WITHOUT CONTRAST TECHNIQUE: Contiguous axial images were obtained from the base of the skull through the vertex without  intravenous contrast. COMPARISON:  Prior head CT examinations 09/19/2019 and earlier, brain MRI 09/21/2015. FINDINGS: Brain: Cerebral volume is normal for age. There is no acute intracranial hemorrhage. No demarcated cortical infarct. No extra-axial fluid collection. No evidence of intracranial mass. No midline shift. Unchanged small scattered foci of parenchymal calcification Redemonstrated partially empty sella turcica. Vascular: No hyperdense vessel. Skull: Normal. Negative for fracture or focal lesion. Sinuses/Orbits: Visualized orbits show no acute finding. Mild right maxillary sinus mucosal thickening. No significant mastoid effusion. IMPRESSION: No CT evidence of acute intracranial abnormality. Redemonstrated partially empty sella turcica. This is very commonly an incidental finding, but can be associated with idiopathic intracranial hypertension. Unchanged small nonspecific scattered parenchymal calcifications. Mild right maxillary sinus mucosal thickening. Electronically Signed   By: Jackey Loge DO   On: 10/07/2019 07:35   CT Angio Chest PE W and/or Wo Contrast  Result Date: 10/13/2019 CLINICAL DATA:  Hypotension.  Deep venous thrombosis EXAM: CT ANGIOGRAPHY CHEST WITH CONTRAST TECHNIQUE: Multidetector CT imaging of the chest was performed using the standard protocol during bolus administration of intravenous contrast. Multiplanar CT image reconstructions and MIPs were obtained to evaluate the vascular anatomy. CONTRAST:  100 mL Isovue 370 nonionic COMPARISON:  Chest radiograph October 07, 2019 FINDINGS: Cardiovascular: There are multiple pulmonary emboli throughout both lower lobes pulmonary arteries as well as in the right upper lobe pulmonary artery and right middle lobe pulmonary artery. Pulmonary emboli are noted in the distal most aspect of the left main pulmonary artery. There is no pulmonary embolus in the right main pulmonary  artery. The right ventricle to left ventricle diameter ratio is 1.0  consistent with a degree of right heart strain. There is no thoracic aortic aneurysm or dissection. Visualized great vessels appear normal. Note that the right innominate and left common carotid arteries arise as a common trunk, an anatomic variant. There is no pericardial effusion or pericardial thickening. Mediastinum/Nodes: Thyroid appears unremarkable. There is no appreciable thoracic adenopathy. No esophageal lesions are evident. Lungs/Pleura: Lungs are clear. No demonstrable pulmonary infarct. No pleural effusions are evident. Upper Abdomen: Visualized upper abdominal structures otherwise appear unremarkable. Musculoskeletal: No blastic or lytic bone lesions. No evident chest wall lesions. Review of the MIP images confirms the above findings. IMPRESSION: 1. Positive for acute PE with CT evidence of right heart strain (RV/LV Ratio = 1.0) consistent with at least submassive (intermediate risk) PE. The presence of right heart strain has been associated with an increased risk of morbidity and mortality. Pulmonary emboli are seen throughout multiple lower lobe pulmonary artery branches as well as in right middle lobe and right upper lobe branches. Pulmonary embolus arises at the distal most aspect of the left main pulmonary artery. 2.  Lungs are clear. 3.  No evident adenopathy. 4.  Spleen absent. Critical Value/emergent results were called by telephone at the time of interpretation on 10/13/2019 at 11:41 am to provider [facility] , who verbally acknowledged these results. Electronically Signed   By: Bretta Bang III M.D.   On: 10/13/2019 11:42   CT ABDOMEN PELVIS W CONTRAST  Result Date: 10/13/2019 CLINICAL DATA:  Thrombocytopenia with hypotension and right leg tingling. Status post left common iliac thrombectomy and stent placement 5 days ago. EXAM: CT ABDOMEN AND PELVIS WITH CONTRAST TECHNIQUE: Multidetector CT imaging of the abdomen and pelvis was performed using the standard protocol following  bolus administration of intravenous contrast. CONTRAST:  OMNIPAQUE IOHEXOL 350 MG/ML SOLN COMPARISON:  10/07/2019 FINDINGS: Lower chest: See CTA chest performed earlier today. Hepatobiliary: No suspicious focal abnormality within the liver parenchyma. There is no evidence for gallstones, gallbladder wall thickening, or pericholecystic fluid. No intrahepatic or extrahepatic biliary dilation. Pancreas: No focal mass lesion. No dilatation of the main duct. No intraparenchymal cyst. No peripancreatic edema. Spleen: Splenectomy with residual splenule. Adrenals/Urinary Tract: No adrenal nodule or mass. Kidneys unremarkable. No evidence for hydroureter. The urinary bladder appears normal for the degree of distention. Stomach/Bowel: Stomach is unremarkable. No gastric wall thickening. No evidence of outlet obstruction. Duodenum is normally positioned as is the ligament of Treitz. No small bowel wall thickening. No small bowel dilatation. The terminal ileum is normal. The appendix is not visualized, but there is no edema or inflammation in the region of the cecum. No gross colonic mass. No colonic wall thickening. Vascular/Lymphatic: No abdominal aortic aneurysm. Interval left common/external iliac vein stent placement. No evidence for filling defect in the inferior IVC. Left iliac stent appears patent without discernible filling defect. The edema/stranding in the left pelvic sidewall around the iliac vessels is similar to the preprocedure study of 10/07/2019. There is no gastrohepatic or hepatoduodenal ligament lymphadenopathy. No retroperitoneal or mesenteric lymphadenopathy. No pelvic sidewall lymphadenopathy. Reproductive: Unremarkable. Other: No intraperitoneal free fluid. Musculoskeletal: 333 No worrisome lytic or sclerotic osseous abnormality. IMPRESSION: 1. No acute findings in the abdomen or pelvis. 2. Interval left common/external iliac vein stent placement. No evidence for filling defect in the inferior IVC  and stent device appears patent. 3. Similar appearance of the edema/stranding in the left pelvic sidewall around the iliac vessels compared  to the pre procedure CT venogram. No evidence for new or progressive extraperitoneal hemorrhage. Electronically Signed   By: Kennith Center M.D.   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  Ultrasound-guided access of left popliteal vein 2.  IVUS (intra-vascular ultrasound) of left popliteal, superficial femoral, common femoral, external iliac, common iliac vein, and IVC 3.  Percutaneous mechanical thrombectomy of left popliteal, superficial femoral, common femoral, external iliac, common iliac vein and IVC (Innari ClotTriever) 4.  Left common/external iliac vein angioplasty with stent placement (predilated with a 12 mm Atlas, left common iliac vein stented with a 16 mm x 90 mm Wallstent, postdilated with a 14 mm Atlas) 5.  Left femoral venogram 6.  74 minutes of monitored moderate conscious sedation time  Indications: 44 year old Hispanic female who presented with extensive left leg swelling and found to have large left iliofemoral DVT.  She did undergo CT venogram that suggested May Thurner with no other evidence of etiology for her DVT.  She presents today for planned percutaneous mechanical thrombectomy with likely stent placement in the left common iliac vein after IVUS.  Risks and benefits have been discussed.  Findings:  After popliteal access, IVUS confirmed mostly acute with some underlying chronic thrombus in the left popliteal, superficial femoral vein, common femoral vein, external iliac vein and common iliac vein with a small  tongue of thrombus into the IVC.  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  Percutaneous mechanical thrombectomy was performed within Innari ClotTriever with 7 passes of the left popliteal, superficial femoral, common femoral, external iliac, and left common iliac vein and IVC and retrieved large volumes of mostly acute and some chronic appearing thrombus.  The left common iliac vein stenosis was then predilated with a 12 mm Atlas, stented with a 16 mm Wallstent, and postdilated with a 14 mm Atlas with good wall apposition no residual stenosis.             Procedure:  The patient was identified in the holding area and taken to room 8.  Patient was placed prone on the table.  The left popliteal fossa was then prepped and draped in usual sterile fashion.  A timeout was performed identify patient, procedure and site.  Initially used ultrasound and evaluated the left popliteal vein which was full of acute thrombus and this was accessed with a micro access needle placed a microwire and then a micro sheath under ultrasound guidance.  I then performed a brief hand-injection to confirm I was in the popliteal vein with a venogram which I confirmed due to her size and depth of her vessels.  I then used a KMP catheter with a Glidewire advantage to cross the thrombus in the left femoral and iliac vein into the IVC and placed my wire up into the subclavian vein.  An 8 French sheath was placed in the left popliteal vein and the patient was given 8000 units of IV heparin.  I then performed IVUS of the left popliteal, superficial femoral, common femoral, external iliac, common iliac vein and  IVC.  Pertinent findings are noted above.  Ultimately elected to perform percutaneous mechanical thrombectomy and we upsized to the 13 [place] Innari sheath in the left popliteal vein.  We then used the [employer] and this  was subsequently placed over the glidewire advantage up into the IVC where the catch bag was deployed and we made a total of 7 passes getting large volumes of acute thrombus with some chronic thrombus as well.  We ultimately performed IVUS again after the first 5 passes and then made 2 additional passes given evidence of additional thrombus.  After 7 passes the iliofemoral segment and IVC all looked fairly clean of thrombus.  We then used our IVUS to mark the iliac vein bifurcation as well as the 87% compression on the left common iliac vein.  This lesion was then predilated with a 14 mm Atlas.  We then selected a 16 mm x 90 mm Wallstent that was deployed into the distal IVC across the iliac vein stenosis in the left common iliac vein and into the left external iliac vein.  This was postdilated with a 14 mm Atlas.  We had good wall apposition on final IVUS with no evidence of residual thrombus and appeared to have good inflow and outflow in the stent.  Satisfied with the results wires and catheters were removed.  I tied a 4-0 Monocryl pursestring around the Innari sheath that was removed from the popliteal vein and pressure was held.  Taken to holding in stable condition.  Plan: The patient should continue heparin tonight and and this should not be held and can be continued immediately.  Can be transitioned to a DOAC tomorrow if she does okay tonight.   Cephus Shelling, MD [facility] Office: [phone]  VAS [place] IVC/ILIAC (VENOUS ONLY)  Result Date: 10/14/2019 IVC/ILIAC STUDY Indications: acute PE with right heart strain Other Factors: History of chronic ITP with left lower extremity DVT. 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 Limitations: Obesity.  Performing Technologist: Marilynne Halsted RDMS, RVT  Examination Guidelines: A complete evaluation includes B-mode imaging, spectral Doppler, color Doppler, and power Doppler as needed of  all accessible portions of each vessel. Bilateral testing is considered an integral part of a complete examination. Limited examinations for reoccurring indications may be performed as noted.  IVC/Iliac Findings: +----------+------+--------+--------+    IVC    PatentThrombusComments +----------+------+--------+--------+ IVC Prox  patent                 +----------+------+--------+--------+ IVC Mid   patent                 +----------+------+--------+--------+ IVC Distalpatent                 +----------+------+--------+--------+  +-----------------+---------+-----------+---------+-----------+--------+        CIV       RT-PatentRT-ThrombusLT-PatentLT-ThrombusComments +-----------------+---------+-----------+---------+-----------+--------+ Common Iliac Prox patent              patent                      +-----------------+---------+-----------+---------+-----------+--------+ Common Iliac Mid  patent                                          +-----------------+---------+-----------+---------+-----------+--------+  +-------------------------+---------+-----------+---------+-----------+--------+  EIV           RT-PatentRT-ThrombusLT-PatentLT-ThrombusComments +-------------------------+---------+-----------+---------+-----------+--------+ External Iliac Vein Prox  patent              patent                      +-------------------------+---------+-----------+---------+-----------+--------+ External Iliac Vein Mid   patent              patent                      +-------------------------+---------+-----------+---------+-----------+--------+ External Iliac Vein       patent              patent                      Distal                                                                    +-------------------------+---------+-----------+---------+-----------+--------+   Summary: IVC/Iliac: The IVC and iliac veins appear patent without  evidence of obvious thrombus.  *See table(s) above for measurements and observations.  Electronically signed by Waverly Ferrarihristopher Dickson MD on 10/14/2019 at 2:49:58 PM.    Final    ECHOCARDIOGRAM COMPLETE  Result Date: 10/13/2019    ECHOCARDIOGRAM REPORT   Patient Name:   [name] Date of Exam: 10/13/2019 Medical Rec #:  [id number]                Height:       60.0 in Accession #:    [id number]               Weight:       246.0 lb Date of Birth:  09/20/1975                 BSA:          2.038 m Patient Age:    43 years                 BP:           121/75 mmHg Patient Gender: F                        HR:           77 bpm. Exam Location:  Inpatient Procedure: 2D Echo, Cardiac Doppler and Color Doppler STAT ECHO Indications:    Pulmonary embolus  History:        Patient has no prior history of Echocardiogram examinations.                 Signs/Symptoms:Morbid obesity; Risk Factors:DVT and Diabetes.  Sonographer:    Lavenia AtlasBrooke Strickland Referring Phys: [phone] GRACE E BOWSER IMPRESSIONS  1. Left ventricular ejection fraction, by estimation, is 60 to 65%. The left ventricle has normal function. The left ventricle has no regional wall motion abnormalities. There is mild concentric left ventricular hypertrophy. Left ventricular diastolic parameters were normal.  2. Right ventricular systolic function is normal. The right ventricular size is normal.  3. The mitral valve is grossly normal. Trivial mitral valve regurgitation. No evidence of mitral stenosis.  4. The aortic valve is grossly normal. Aortic valve regurgitation  is not visualized. No aortic stenosis is present. Conclusion(s)/Recommendation(s): Normal biventricular function without evidence of hemodynamically significant valvular heart disease. FINDINGS  Left Ventricle: Left ventricular ejection fraction, by estimation, is 60 to 65%. The left ventricle has normal function. The left ventricle has no regional wall motion abnormalities. The left ventricular  internal cavity size was normal in size. There is  mild concentric left ventricular hypertrophy. Left ventricular diastolic parameters were normal. Normal left ventricular filling pressure. Right Ventricle: The right ventricular size is normal. No increase in right ventricular wall thickness. Right ventricular systolic function is normal. Left Atrium: Left atrial size was normal in size. Right Atrium: Right atrial size was normal in size. Pericardium: There is no evidence of pericardial effusion. Mitral Valve: The mitral valve is grossly normal. Trivial mitral valve regurgitation. No evidence of mitral valve stenosis. Tricuspid Valve: The tricuspid valve is grossly normal. Tricuspid valve regurgitation is trivial. No evidence of tricuspid stenosis. Aortic Valve: The aortic valve is grossly normal. Aortic valve regurgitation is not visualized. No aortic stenosis is present. Pulmonic Valve: The pulmonic valve was grossly normal. Pulmonic valve regurgitation is not visualized. No evidence of pulmonic stenosis. Aorta: The aortic root is normal in size and structure. Venous: The inferior vena cava was not well visualized. IAS/Shunts: The atrial septum is grossly normal.  LEFT VENTRICLE PLAX 2D LVIDd:         3.70 cm  Diastology LVIDs:         2.60 cm  LV e' lateral:   7.83 cm/s LV PW:         1.20 cm  LV E/e' lateral: 8.6 LV IVS:        1.20 cm  LV e' medial:    5.33 cm/s LVOT diam:     2.30 cm  LV E/e' medial:  12.7 LV SV:         117 LV SV Index:   57 LVOT Area:     4.15 cm  RIGHT VENTRICLE RV Basal diam:  2.60 cm RV S prime:     6.64 cm/s TAPSE (M-mode): 2.4 cm LEFT ATRIUM             Index       RIGHT ATRIUM           Index LA diam:        3.20 cm 1.57 cm/m  RA Area:     12.60 cm LA Vol (A2C):   31.7 ml 15.55 ml/m RA Volume:   27.80 ml  13.64 ml/m LA Vol (A4C):   36.1 ml 17.71 ml/m LA Biplane Vol: 34.8 ml 17.07 ml/m  AORTIC VALVE LVOT Vmax:   122.00 cm/s LVOT Vmean:  87.100 cm/s LVOT VTI:    0.281 m  AORTA Ao  Root diam: 2.30 cm MITRAL VALVE               TRICUSPID VALVE MV Area (PHT): 3.99 cm    TR Peak grad:   19.4 mmHg MV Decel Time: 190 msec    TR Vmax:        220.00 cm/s MV E velocity: 67.70 cm/s MV A velocity: 86.10 cm/s  SHUNTS MV E/A ratio:  0.79        Systemic VTI:  0.28 m                            Systemic Diam: 2.30 cm Lennie Odor MD Electronically signed by Lennie Odor  MD Signature Date/Time: 10/13/2019/4:43:46 PM    Final    [place] RT LOWER EXTREM LTD SOFT TISSUE NON VASCULAR  Result Date: 10/17/2019 CLINICAL DATA:  Right thigh swelling with bruising EXAM: ULTRASOUND right LOWER EXTREMITY LIMITED TECHNIQUE: Ultrasound examination of the lower extremity soft tissues was performed in the area of clinical concern. COMPARISON:  None. FINDINGS: Targeted ultrasound of the right lateral thigh in the area of swelling is performed. Within the superficial soft tissues, there is a mostly echogenic mass measuring 9 x 9 x 11 mm with a focal cystic component containing internal echoes. No significant internal flow. IMPRESSION: 11 mm mostly hyperechoic mass within the superficial soft tissues in the right lateral thigh in the region of swelling, the sonographic features are suggestive of a soft tissue hematoma Electronically Signed   By: Jasmine Pang M.D.   On: 10/17/2019 16:59   CT VENOGRAM ABD/PEL  Result Date: 10/07/2019 CLINICAL DATA:  44 year old with known left lower DVT. CT venogram ordered to evaluate the extent of the DVT. EXAM: CT VENOGRAM ABD-PELVIS TECHNIQUE: Multidetector CT imaging of the abdomen and pelvis was performed using the standard venogram protocol during bolus administration of intravenous contrast. Multiplanar reconstructed images and MIPs were obtained and reviewed to evaluate the vascular anatomy. CONTRAST:  OMNIPAQUE IOHEXOL 350 MG/ML SOLN COMPARISON:  03/13/2018 FINDINGS: VASCULAR Arterial structures: Normal caliber of the abdominal aorta without atherosclerotic calcifications.  Small amount of calcification in the right common iliac artery. Bilateral iliac arteries are patent. Portal venous system: Portal venous system is patent. IVC: Small amount of nonocclusive thrombus along the left side of the distal IVC. This thrombus extends approximately 2.6 cm above the IVC bifurcation. Suprarenal IVC is patent. Bilateral renal veins are patent. Iliac veins: Thrombus and stranding involving the left common and left external iliac veins. Findings are suggestive for acute thrombus. At least mild compression on the left common iliac vein from the right common iliac artery. Right iliac veins appear to be patent. Evidence for thrombus in the proximal left femoral veins. Review of the MIP images confirms the above findings. NON-VASCULAR Lower chest: Lung bases are clear. Thrombus in left lower lobe segmental pulmonary arteries best seen on sequence 3, image 1. Limited evaluation for pulmonary embolism on this examination. Hepatobiliary: Low-attenuation of the liver is suggestive for hepatic steatosis. Normal appearance of the gallbladder. No biliary dilatation. Pancreas: Unremarkable. No pancreatic ductal dilatation or surrounding inflammatory changes. Spleen: Surgically removed. Adrenals/Urinary Tract: Normal appearance of the adrenal glands. Normal appearance of both kidneys without hydronephrosis. No suspicious renal lesions. Urinary bladder is mildly distended. Stomach/Bowel: Normal appearance of the stomach. No evidence for bowel obstruction or focal bowel inflammation. Lymphatic: But cannot exclude prominent lymph nodes in the left lower iliac region but these may be reactive. Extensive edema along the left iliac chain related to the DVT also limits evaluation. Otherwise, no significant lymph node enlargement in the abdomen or pelvis. Reproductive: 3.3 cm low-density structure associated with the left ovary likely represents a cyst. Probable right ovarian follicle measuring 2.1 cm. Uterus is  unremarkable. Other: Extensive edema and stranding along the left iliac vessels compatible with the acute DVT. Negative for free fluid. Negative for free air. Musculoskeletal: No acute bone abnormality. IMPRESSION: VASCULAR 1. Positive for deep venous thrombosis involving the distal IVC, left iliac veins and proximal left femoral veins. The distal IVC thrombus is nonocclusive. 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 2. Pulmonary embolism in the  left lower lobe. Incomplete evaluation of the pulmonary embolism on this examination. NON-VASCULAR 1. Hepatic steatosis. 2. Probable left ovarian cyst. 3. Extensive stranding along the left side of the pelvis related to the left-sided DVT. These results were called by telephone at the time of interpretation on 10/07/2019 at 5:17 pm to Dr. Charm Barges in the emergency department, Who verbally acknowledged these results. Electronically Signed   By: Richarda Overlie M.D.   On: 10/07/2019 17:23   VAS [place] LOWER EXTREMITY VENOUS (DVT)  Result Date: 10/14/2019  Lower Venous DVTStudy Indications: Pulmonary embolism. Other Indications: She underwent left lower extremity mechanical thrombectomy                    and left iliac vein stenting by Dr. Chestine Spore on 10/08/2019. Comparison Study: Left lower ext venous 10/07/19. Performing Technologist: Marilynne Halsted RDMS, RVT  Examination Guidelines: A complete evaluation includes B-mode imaging, spectral Doppler, color Doppler, and power Doppler as needed of all accessible portions of each vessel. Bilateral testing is considered an integral part of a complete examination. Limited examinations for reoccurring indications may be performed as noted. The reflux portion of the exam is performed with the patient in reverse Trendelenburg.  +---------+---------------+---------+-----------+----------+--------------+ RIGHT    CompressibilityPhasicitySpontaneityPropertiesThrombus Aging  +---------+---------------+---------+-----------+----------+--------------+ CFV      Full           Yes      Yes                                 +---------+---------------+---------+-----------+----------+--------------+ SFJ      Full                                                        +---------+---------------+---------+-----------+----------+--------------+ FV Prox  Full                                                        +---------+---------------+---------+-----------+----------+--------------+ FV Mid   Full                                                        +---------+---------------+---------+-----------+----------+--------------+ FV DistalFull                                                        +---------+---------------+---------+-----------+----------+--------------+ PFV      Full                                                        +---------+---------------+---------+-----------+----------+--------------+ POP      Full           Yes  Yes                                 +---------+---------------+---------+-----------+----------+--------------+ PTV      Full                                                        +---------+---------------+---------+-----------+----------+--------------+ PERO     Full                                                        +---------+---------------+---------+-----------+----------+--------------+   +---------+---------------+---------+-----------+----------+-------------------+ LEFT     CompressibilityPhasicitySpontaneityPropertiesThrombus Aging      +---------+---------------+---------+-----------+----------+-------------------+ CFV      Full           Yes      Yes                                      +---------+---------------+---------+-----------+----------+-------------------+ SFJ      Full                                                              +---------+---------------+---------+-----------+----------+-------------------+ FV Prox  Full                                                             +---------+---------------+---------+-----------+----------+-------------------+ FV Mid   Partial                                      Chronic             +---------+---------------+---------+-----------+----------+-------------------+ FV DistalPartial                                      Chronic             +---------+---------------+---------+-----------+----------+-------------------+ PFV      Full                                                             +---------+---------------+---------+-----------+----------+-------------------+ POP                                                   bandage  in place,                                                         not visualized      +---------+---------------+---------+-----------+----------+-------------------+ PTV      Partial                                      Age Indeterminate   +---------+---------------+---------+-----------+----------+-------------------+ PERO     None                                         Age Indeterminate   +---------+---------------+---------+-----------+----------+-------------------+     Summary: RIGHT: - There is no evidence of deep vein thrombosis in the lower extremity.  LEFT: - Findings consistent with age indeterminate deep vein thrombosis involving the left peroneal veins, and left posterior tibial veins. - Findings consistent with chronic deep vein thrombosis involving the left femoral vein.  *See table(s) above for measurements and observations. Electronically signed by Waverly Ferrari MD on 10/14/2019 at 2:49:46 PM.    Final    VAS [place] LOWER EXTREMITY VENOUS (DVT) (ONLY [facility] & [facility])  Result Date: 10/07/2019  Lower Venous DVTStudy Indications: Pain.  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 Limitations: Body habitus. Comparison  Study: No prior studies. Performing Technologist: Jean Rosenthal  Examination Guidelines: A complete evaluation includes B-mode imaging, spectral Doppler, color Doppler, and power Doppler as needed of all accessible portions of each vessel. Bilateral testing is considered an integral part of a complete examination. Limited examinations for reoccurring indications may be performed as noted. The reflux portion of the exam is performed with the patient in reverse Trendelenburg.  +-----+---------------+---------+-----------+----------+--------------+ RIGHTCompressibilityPhasicitySpontaneityPropertiesThrombus Aging +-----+---------------+---------+-----------+----------+--------------+ CFV  Full           Yes      Yes                                 +-----+---------------+---------+-----------+----------+--------------+   +---------+---------------+---------+-----------+----------+-----------------+ LEFT     CompressibilityPhasicitySpontaneityPropertiesThrombus Aging    +---------+---------------+---------+-----------+----------+-----------------+ CFV      None                                         Age Indeterminate +---------+---------------+---------+-----------+----------+-----------------+ SFJ      None                                         Age Indeterminate +---------+---------------+---------+-----------+----------+-----------------+ FV Prox  None           No       Yes                  Age Indeterminate +---------+---------------+---------+-----------+----------+-----------------+ FV Mid   None           No       Yes                  Age Indeterminate +---------+---------------+---------+-----------+----------+-----------------+  FV DistalNone           No       Yes                  Age Indeterminate +---------+---------------+---------+-----------+----------+-----------------+ PFV      None           No       Yes                  Age Indeterminate  +---------+---------------+---------+-----------+----------+-----------------+ POP      None           No       Yes                  Age Indeterminate +---------+---------------+---------+-----------+----------+-----------------+ PTV      None                                         Age Indeterminate +---------+---------------+---------+-----------+----------+-----------------+ PERO     None                                         Age Indeterminate +---------+---------------+---------+-----------+----------+-----------------+ Gastroc  None                                         Age Indeterminate +---------+---------------+---------+-----------+----------+-----------------+     Summary: RIGHT: - No evidence of common femoral vein obstruction.  LEFT: - Findings consistent with age indeterminate deep vein thrombosis involving the left common femoral vein, SF junction, left femoral vein, left proximal profunda vein, left popliteal vein, left posterior tibial veins, left peroneal veins, and left gastrocnemius veins. - No cystic structure found in the popliteal fossa. - Unable to evaluate extension of common femoral vein obstruction proximal to the inguinal ligament due to pannus/patient body habitus.  *See table(s) above for measurements and observations. Electronically signed by Lemar Livings MD on 10/07/2019 at 5:30:33 PM.    Final      Subjective: Eager to go home  Discharge Exam: Vitals:   10/19/19 2153 10/20/19 0502  BP: 98/66 92/69  Pulse: 86 84  Resp: 17 16  Temp: 98.4 F (36.9 C) 98.2 F (36.8 C)  SpO2: 97% 96%   Vitals:   10/19/19 0557 10/19/19 1500 10/19/19 2153 10/20/19 0502  BP:  95/65 98/66 92/69   Pulse:  66 86 84  Resp:  16 17 16   Temp:  97.8 F (36.6 C) 98.4 F (36.9 C) 98.2 F (36.8 C)  TempSrc:  Oral  Oral  SpO2:  97% 97% 96%  Weight: (!) 113.2 kg     Height:        General: Pt is alert, awake, not in acute distress Cardiovascular: RRR, S1/S2  +, no rubs, no gallops Respiratory: CTA bilaterally, no wheezing, no rhonchi Abdominal: Soft, NT, ND, bowel sounds + Extremities: no edema, no cyanosis   The results of significant diagnostics from this hospitalization (including imaging, microbiology, ancillary and laboratory) are listed below for reference.     Microbiology: Recent Results (from the past 240 hour(s))  SARS Coronavirus 2 by RT PCR (hospital order, performed in [facility] hospital lab) Nasopharyngeal Nasopharyngeal Swab     Status: None   Collection  Time: 10/13/19  9:25 AM   Specimen: Nasopharyngeal Swab  Result Value Ref Range Status   SARS Coronavirus 2 NEGATIVE NEGATIVE Final    Comment: (NOTE) SARS-CoV-2 target nucleic acids are NOT DETECTED.  The SARS-CoV-2 RNA is generally detectable in upper and lower respiratory specimens during the acute phase of infection. The lowest concentration of SARS-CoV-2 viral copies this assay can detect is 250 copies / mL. A negative result does not preclude SARS-CoV-2 infection and should not be used as the sole basis for treatment or other patient management decisions.  A negative result may occur with improper specimen collection / handling, submission of specimen other than nasopharyngeal swab, presence of viral mutation(s) within the areas targeted by this assay, and inadequate number of viral copies (<250 copies / mL). A negative result must be combined with clinical observations, patient history, and epidemiological information.  Fact Sheet for Patients:   BoilerBrush.com.cy  Fact Sheet for Healthcare Providers: https://pope.com/  This test is not yet approved or  cleared by the [place] FDA and has been authorized for detection and/or diagnosis of SARS-CoV-2 by FDA under an Emergency Use Authorization (EUA).  This EUA will remain in effect (meaning this test can be used) for the duration of the COVID-19  declaration under Section 564(b)(1) of the Act, 21 U.S.C. section 360bbb-3(b)(1), unless the authorization is terminated or revoked sooner.  Performed at [facility], [address], [place], [place] [address]   Urine culture     Status: Abnormal   Collection Time: 10/13/19  3:25 PM   Specimen: Urine, Random  Result Value Ref Range Status   Specimen Description URINE, RANDOM  Final   Special Requests   Final    NONE Performed at [facility], [address] [place], [place] [address]    Culture MULTIPLE SPECIES PRESENT, SUGGEST RECOLLECTION (A)  Final   Report Status 10/15/2019 FINAL  Final  Urine culture     Status: Abnormal   Collection Time: 10/18/19  6:17 PM   Specimen: Urine, Random  Result Value Ref Range Status   Specimen Description URINE, RANDOM  Final   Special Requests   Final    NONE Performed at [facility], [address], [place], [place] [address]    Culture MULTIPLE SPECIES PRESENT, SUGGEST RECOLLECTION (A)  Final   Report Status 10/19/2019 FINAL  Final     Labs: BNP (last 3 results) No results for input(s): BNP in the last 8760 hours. Basic Metabolic Panel: Recent Labs  Lab 10/15/19 0544  NA 132*  K 4.4  CL 106  CO2 21*  GLUCOSE 122*  BUN 17  CREATININE 0.80  CALCIUM 8.7*   Liver Function Tests: No results for input(s): AST, ALT, ALKPHOS, BILITOT, PROT, ALBUMIN in the last 168 hours. No results for input(s): LIPASE, AMYLASE in the last 168 hours. No results for input(s): AMMONIA in the last 168 hours. CBC: Recent Labs  Lab 10/16/19 0506 10/17/19 0201 10/18/19 0227 10/19/19 0326 10/20/19 0150  WBC 21.7* 22.2* 23.6* 14.5* 8.6  HGB 10.5* 10.7* 11.3* 12.3 12.0  HCT 32.5* 33.5* 35.1* 37.7 37.1  MCV 89.0 87.9 88.6 88.3 87.3  PLT 77* 228 213 265 231   Cardiac Enzymes: No results for input(s): CKTOTAL, CKMB, CKMBINDEX, TROPONINI in the last 168 hours. BNP: Invalid input(s): POCBNP CBG: Recent Labs  Lab  10/17/19 0514  GLUCAP 179*   D-Dimer No results for input(s): DDIMER in the last 72 hours. Hgb  A1c No results for input(s): HGBA1C in the last 72 hours. Lipid Profile No results for input(s): CHOL, HDL, LDLCALC, TRIG, CHOLHDL, LDLDIRECT in the last 72 hours. Thyroid function studies No results for input(s): TSH, T4TOTAL, T3FREE, THYROIDAB in the last 72 hours.  Invalid input(s): FREET3 Anemia work up No results for input(s): VITAMINB12, FOLATE, FERRITIN, TIBC, IRON, RETICCTPCT in the last 72 hours. Urinalysis    Component Value Date/Time   COLORURINE YELLOW 10/18/2019 1831   APPEARANCEUR HAZY (A) 10/18/2019 1831   LABSPEC 1.017 10/18/2019 1831   PHURINE 7.0 10/18/2019 1831   GLUCOSEU NEGATIVE 10/18/2019 1831   HGBUR LARGE (A) 10/18/2019 1831   BILIRUBINUR NEGATIVE 10/18/2019 1831   BILIRUBINUR neg 10/12/2015 0852   KETONESUR NEGATIVE 10/18/2019 1831   PROTEINUR NEGATIVE 10/18/2019 1831   UROBILINOGEN 0.2 10/12/2015 0852   UROBILINOGEN 0.2 10/02/2011 1133   NITRITE NEGATIVE 10/18/2019 1831   LEUKOCYTESUR TRACE (A) 10/18/2019 1831   Sepsis Labs Invalid input(s): PROCALCITONIN,  WBC,  LACTICIDVEN Microbiology Recent Results (from the past 240 hour(s))  SARS Coronavirus 2 by RT PCR (hospital order, performed in [facility] hospital lab) Nasopharyngeal Nasopharyngeal Swab     Status: None   Collection Time: 10/13/19  9:25 AM   Specimen: Nasopharyngeal Swab  Result Value Ref Range Status   SARS Coronavirus 2 NEGATIVE NEGATIVE Final    Comment: (NOTE) SARS-CoV-2 target nucleic acids are NOT DETECTED.  The SARS-CoV-2 RNA is generally detectable in upper and lower respiratory specimens during the acute phase of infection. The lowest concentration of SARS-CoV-2 viral copies this assay can detect is 250 copies / mL. A negative result does not preclude SARS-CoV-2 infection and should not be used as the sole basis for treatment or other patient management decisions.  A negative  result may occur with improper specimen collection / handling, submission of specimen other than nasopharyngeal swab, presence of viral mutation(s) within the areas targeted by this assay, and inadequate number of viral copies (<250 copies / mL). A negative result must be combined with clinical observations, patient history, and epidemiological information.  Fact Sheet for Patients:   BoilerBrush.com.cy  Fact Sheet for Healthcare Providers: https://pope.com/  This test is not yet approved or  cleared by the [place] FDA and has been authorized for detection and/or diagnosis of SARS-CoV-2 by FDA under an Emergency Use Authorization (EUA).  This EUA will remain in effect (meaning this test can be used) for the duration of the COVID-19 declaration under Section 564(b)(1) of the Act, 21 U.S.C. section 360bbb-3(b)(1), unless the authorization is terminated or revoked sooner.  Performed at [facility], [address] [place], [place] [address]   Urine culture     Status: Abnormal   Collection Time: 10/13/19  3:25 PM   Specimen: Urine, Random  Result Value Ref Range Status   Specimen Description URINE, RANDOM  Final   Special Requests   Final    NONE Performed at [facility], [address] [place], [place] [address]    Culture MULTIPLE SPECIES PRESENT, SUGGEST RECOLLECTION (A)  Final   Report Status 10/15/2019 FINAL  Final  Urine culture     Status: Abnormal   Collection Time: 10/18/19  6:17 PM   Specimen: Urine, Random  Result Value Ref Range Status   Specimen Description URINE, RANDOM  Final   Special Requests   Final    NONE Performed at [facility], [address], [place], [place] [address]  Culture MULTIPLE SPECIES PRESENT, SUGGEST RECOLLECTION (A)  Final   Report Status 10/19/2019 FINAL  Final   Time spent: 30 min  SIGNED:   Rickey Barbara, MD  [facility] 10/20/2019, 2:29 PM  If  7PM-7AM, please contact night-coverage

## 2019-10-20 NOTE — Progress Notes (Addendum)
ANTICOAGULATION CONSULT NOTE  Pharmacy Consult for Heparin >> Warfarin Indication: pulmonary embolus  No Known Allergies  Patient Measurements: Height: 4\' 9"  (144.8 cm) Weight: (!) 113.2 kg (249 lb 9 oz) IBW/kg (Calculated) : 38.6 Heparin Dosing Weight: 73.3 kg  Vital Signs: Temp: 98.2 F (36.8 C) (08/02 0502) Temp Source: Oral (08/02 0502) BP: 92/69 (08/02 0502) Pulse Rate: 84 (08/02 0502)  Labs: Recent Labs    10/18/19 0227 10/18/19 0227 10/19/19 0326 10/20/19 0150  HGB 11.3*   < > 12.3 12.0  HCT 35.1*  --  37.7 37.1  PLT 213  --  265 231  LABPROT 20.4*  --  21.9* 23.5*  INR 1.8*  --  2.0* 2.2*  HEPARINUNFRC 0.33  --   --   --    < > = values in this interval not displayed.    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 Patient was recently started on warfarin 7/21 for acute DVT,  admit INR 1.5. Warfarin is followed by oncologist outpatient, had appointment for 7/27 but was admitted here before then. Pharmacy consulted to restart warfarin.   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 H/H stable despite reports of bleeding. INR therapeutic at 2.2 after 7.5mg  x3, up trending slowly. Will continue 7.5mg  to see if can maintain at goal on this dose.  PTA warfarin dose (just started on 10/08/19, not stable yet): 5 mg daily   Goal of Therapy:  INR goal: 2-3 Monitor platelets by anticoagulation protocol: Yes   Plan:  Warfarin 7.5 mg PO x1  Monitor daily INR, CBC, decrease frequency when stable  Monitor for signs/symptoms of bleeding If discharged today, suggest 7.5mg  TuThSaSun and 5mg  MWF with INR follow up in 2-4 days (by Friday this week)     , PharmD, BCPS, BCCP Clinical  Pharmacist  Please check AMION for all [facility] Pharmacy phone numbers After 10:00 PM, call Main Pharmacy [phone]

## 2019-10-20 NOTE — Progress Notes (Signed)
[name]   DOB:1975-12-23   GL#:[id number]   JJO#:[id number]  Hematology follow up   Subjective: Reports that she is feeling well today.  No bleeding reported.  Anticipates that she may be discharged later today.  Objective:  Vitals:   10/19/19 2153 10/20/19 0502  BP: 98/66 92/69  Pulse: 86 84  Resp: 17 16  Temp: 98.4 F (36.9 C) 98.2 F (36.8 C)  SpO2: 97% 96%    Body mass index is 54 kg/m.  Intake/Output Summary (Last 24 hours) at 10/20/2019 1414 Last data filed at 10/20/2019 0502 Gross per 24 hour  Intake 480 ml  Output --  Net 480 ml     Sclerae unicteric  Oropharynx no bleeding noted  No peripheral adenopathy  Lungs clear -- no rales or rhonchi  Heart regular rate and rhythm  Abdomen benign  MSK: Swelling in the lateral aspect of her right upper leg, stable  CBG (last 3)  No results for input(s): GLUCAP in the last 72 hours.   Labs:  Urine Studies No results for input(s): UHGB, CRYS in the last 72 hours.  Invalid input(s): UACOL, UAPR, USPG, UPH, UTP, UGL, UKET, UBIL, UNIT, UROB, ULEU, UEPI, UWBC, URBC, UBAC, CAST, UCOM, BILUA  Basic Metabolic Panel: Recent Labs  Lab 10/15/19 0544  NA 132*  K 4.4  CL 106  CO2 21*  GLUCOSE 122*  BUN 17  CREATININE 0.80  CALCIUM 8.7*   GFR Estimated Creatinine Clearance: 95.9 mL/min (by C-G formula based on SCr of 0.8 mg/dL). Liver Function Tests: No results for input(s): AST, ALT, ALKPHOS, BILITOT, PROT, ALBUMIN in the last 168 hours. No results for input(s): LIPASE, AMYLASE in the last 168 hours. No results for input(s): AMMONIA in the last 168 hours. Coagulation profile Recent Labs  Lab 10/16/19 0506 10/17/19 0201 10/18/19 0227 10/19/19 0326 10/20/19 0150  INR 1.4* 1.6* 1.8* 2.0* 2.2*    CBC: Recent Labs  Lab 10/16/19 0506 10/17/19 0201 10/18/19 0227 10/19/19 0326 10/20/19 0150  WBC 21.7* 22.2* 23.6* 14.5* 8.6  HGB 10.5* 10.7* 11.3* 12.3 12.0  HCT 32.5* 33.5* 35.1* 37.7 37.1  MCV 89.0  87.9 88.6 88.3 87.3  PLT 77* 228 213 265 231   Cardiac Enzymes: No results for input(s): CKTOTAL, CKMB, CKMBINDEX, TROPONINI in the last 168 hours. BNP: Invalid input(s): POCBNP CBG: Recent Labs  Lab 10/17/19 0514  GLUCAP 179*   D-Dimer No results for input(s): DDIMER in the last 72 hours. Hgb A1c No results for input(s): HGBA1C in the last 72 hours. Lipid Profile No results for input(s): CHOL, HDL, LDLCALC, TRIG, CHOLHDL, LDLDIRECT in the last 72 hours. Thyroid function studies No results for input(s): TSH, T4TOTAL, T3FREE, THYROIDAB in the last 72 hours.  Invalid input(s): FREET3 Anemia work up No results for input(s): VITAMINB12, FOLATE, FERRITIN, TIBC, IRON, RETICCTPCT in the last 72 hours. Microbiology Recent Results (from the past 240 hour(s))  SARS Coronavirus 2 by RT PCR (hospital order, performed in [facility] hospital lab) Nasopharyngeal Nasopharyngeal Swab     Status: None   Collection Time: 10/13/19  9:25 AM   Specimen: Nasopharyngeal Swab  Result Value Ref Range Status   SARS Coronavirus 2 NEGATIVE NEGATIVE Final    Comment: (NOTE) SARS-CoV-2 target nucleic acids are NOT DETECTED.  The SARS-CoV-2 RNA is generally detectable in upper and lower respiratory specimens during the acute phase of infection. The lowest concentration of SARS-CoV-2 viral copies this assay can detect is 250 copies / mL. A negative result does not  preclude SARS-CoV-2 infection and should not be used as the sole basis for treatment or other patient management decisions.  A negative result may occur with improper specimen collection / handling, submission of specimen other than nasopharyngeal swab, presence of viral mutation(s) within the areas targeted by this assay, and inadequate number of viral copies (<250 copies / mL). A negative result must be combined with clinical observations, patient history, and epidemiological information.  Fact Sheet for Patients:    BoilerBrush.com.cy  Fact Sheet for Healthcare Providers: https://pope.com/  This test is not yet approved or  cleared by the [place] FDA and has been authorized for detection and/or diagnosis of SARS-CoV-2 by FDA under an Emergency Use Authorization (EUA).  This EUA will remain in effect (meaning this test can be used) for the duration of the COVID-19 declaration under Section 564(b)(1) of the Act, 21 U.S.C. section 360bbb-3(b)(1), unless the authorization is terminated or revoked sooner.  Performed at [facility], [address] [place], [place] [address]   Urine culture     Status: Abnormal   Collection Time: 10/13/19  3:25 PM   Specimen: Urine, Random  Result Value Ref Range Status   Specimen Description URINE, RANDOM  Final   Special Requests   Final    NONE Performed at [facility], [address], [place], [place] [address]    Culture MULTIPLE SPECIES PRESENT, SUGGEST RECOLLECTION (A)  Final   Report Status 10/15/2019 FINAL  Final  Urine culture     Status: Abnormal   Collection Time: 10/18/19  6:17 PM   Specimen: Urine, Random  Result Value Ref Range Status   Specimen Description URINE, RANDOM  Final   Special Requests   Final    NONE Performed at [facility], [address] [place], [place] [address]    Culture MULTIPLE SPECIES PRESENT, SUGGEST RECOLLECTION (A)  Final   Report Status 10/19/2019 FINAL  Final      Studies:  No results found.  Assessment: 44 y.o. female   1.  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  Submassive bilateral PE  3.  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  History of mild subarachnoid hemorrhage 5.  Obesity 6. Chronic headaches and dizziness  7. Depression  8.  Right upper leg swelling and neuropathy, small  hematoma 9. Vaginal bleeding and hematuria   Plan:  -Lab reviewed, platelets stable and hemoglobin remains normal.  INR 2.2 today.  Scheduled for warfarin 7.5 mg today. -Continue Cymbalta 20 mg daily for her right thigh neuropathy and her depression.  -Okay to discharge home today if otherwise medically stable. -She is scheduled for labs and visit on Wednesday and Rituxan on Thursday.  Clenton Pare, NP 10/20/2019  2:14 PM

## 2019-10-20 NOTE — Discharge Instructions (Signed)
Coumadin regimen: 7.5mg  TuThSaSun and 5mg  MWF with INR follow up in 2-4 days

## 2019-10-20 NOTE — Telephone Encounter (Signed)
Scheduled per 7/30 sch message. Called interpreter line to speak with pt. Pt is aware of appt times and dates. Noted to give pt appt calendar on next visit.

## 2019-10-20 NOTE — Progress Notes (Signed)
Patient discharged to home with instructions given to patient and her daughter.

## 2019-10-21 NOTE — Progress Notes (Signed)
[facility]   Telephone:([phone] [phone]   Clinic Follow up Note   Patient Care Team: Alice Mood, MD as PCP - General (Hematology) Alice Feinstein, MD as Consulting Physician (Oncology) [name] (Hematology and Oncology) Alice Mocha, MD as Consulting Physician (Ophthalmology) Alice Deeds, MD (Internal Medicine) 10/22/2019  CHIEF COMPLAINT: Hospital follow-up acute on chronic ITP, bleeding, acute submassive bilateral PE, left DVT   PREVIOUS AND CURRENT THERAPY:  -Promacta 25mg  daily starting 10/22/18. Stopped 04/08/18 due to recurrent flares.  -Nplateq2weeks starting in 2 weeks. Increased to weekly on 08/11/19 due to recurrent ITP flares.Willgive her 2 mcg/kg if platelet normal, and increase to 3 mcg/kg if platelet less than 150K. Increased to 30mcg/kg starting 09/03/19.Increased to 74mcg/kg on 09/10/19 and increase again to 26mcg/kg starting 09/17/19. D/u DVT and PE, hold Nplate unless plt <5K -periodic use of steroids (decadron) and IVIG  -Rituxan weekly X4, received first dose in hospital 10/16/2019  INTERVAL HISTORY: Ms. 10/18/2019 returns for hospital follow-up.  She has been hospitalized twice in July.  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  She underwent IVUS of the left lower femoral vein with percutaneous mechanical thrombectomy and stenting of the left leg per vascular surgery.  She was bridged with Lovenox and discharged 10/10/19 on Coumadin.  She was scheduled to begin outpatient Rituxan for refractory ITP on 7/26 but presented to ED with severe thrombocytopenia platelet less than 5K and gum bleeding.  CTA showed at least submassive bilateral PE. Vascular surgery did not recommend IVC filter due to risk for occluding her left iliac vein stent while off anticoagulation for the procedure, then occluding her IVC filter therefore leaving  with no surgical options.  She was continued on aggressive management for ITP including platelet transfusion, 40 mg Dex daily for 4 days, IVIG, and Nplate.  Anticoagulation was held until platelets greater than 30 then received heparin without bolus.  She was given first dose of Rituxan for refractory ITP on 7/29, plan for weekly x4.  She continued heparin and Coumadin until INR therapeutic, platelets significantly improved.  She developed vaginal bleeding and bridging heparin was stopped, she continued Coumadin and reached therapeutic INR 2.0 on 8/1.  She was discharged on 8/2 on Coumadin 7.5 mg daily.  Denies missed or extra doses.  Today, she presents with a family member who is translating, for second weekly dose of Rituxan as scheduled.  She feels okay since hospital discharge but a little weak and dizzy.  She is able to do ADLs.  She thinks Cymbalta is contributing to her weakness.  She tried citalopram and sertraline for depression in the past.  She is eating and drinking.  She had pelvic cramps yesterday that resolved after rest.  The vaginal bleeding is minimal, she has some gum bleeding after brushing that resolves.  She has blood after BM with wiping and "bumps."  Denies epistaxis or hematuria, or n/v/c/d. Rituxan went fine, denies allergic reaction, rash, fever, chills, cough, chest pain, dyspnea.  The sutures from her thrombectomy are bothering her.  The left leg is still sore.  She still has numbness in the right upper leg.   MEDICAL HISTORY:  Past Medical History:  Diagnosis Date  . Anemia   . Chronic ITP (idiopathic thrombocytopenia) (HCC)   . Depression   . Headache   . Retinal hemorrhage of both eyes 10/05/2015   Due  to severe thrombocytopenia from ITP    SURGICAL HISTORY: Past Surgical History:  Procedure Laterality Date  . BREAST SURGERY  biopsy  . LOWER EXTREMITY VENOGRAPHY Left 10/08/2019   Procedure: LOWER EXTREMITY VENOGRAPHY;  Surgeon: Cephus Shelling, MD;  Location:  [facility] INVASIVE CV LAB;  Service: Cardiovascular;  Laterality: Left;  . PERIPHERAL VASCULAR INTERVENTION Left 10/08/2019   Procedure: PERIPHERAL VASCULAR INTERVENTION;  Surgeon: Cephus Shelling, MD;  Location: [facility] INVASIVE CV LAB;  Service: Cardiovascular;  Laterality: Left;  LT COMMON ILIAC VEIN  . PERIPHERAL VASCULAR THROMBECTOMY N/A 10/08/2019   Procedure: PERIPHERAL VASCULAR THROMBECTOMY;  Surgeon: Cephus Shelling, MD;  Location: [facility] INVASIVE CV LAB;  Service: Cardiovascular;  Laterality: N/A;  . SPLENECTOMY, TOTAL N/A 09/16/2015   Procedure: OPEN SPLENECTOMY;  Surgeon: Chevis Pretty III, MD;  Location: [facility] OR;  Service: General;  Laterality: N/A;    I have reviewed the social history and family history with the patient and they are unchanged from previous note.  ALLERGIES:  has No Known Allergies.  MEDICATIONS:  Current Outpatient Medications  Medication Sig Dispense Refill  . DULoxetine (CYMBALTA) 20 MG capsule Take 1 capsule (20 mg total) by mouth daily. 30 capsule 0  . pantoprazole (PROTONIX) 40 MG tablet Take 1 tablet (40 mg total) by mouth daily. 30 tablet 0  . warfarin (COUMADIN) 5 MG tablet 7.5mg  TuThSaSun and 5mg  MWF 30 tablet 2   No current facility-administered medications for this visit.    PHYSICAL EXAMINATION: ECOG PERFORMANCE STATUS: 1 - Symptomatic but completely ambulatory  Vitals:   10/22/19 1052  BP: 113/77  Pulse: 95  Resp: 18  Temp: 97.8 F (36.6 C)  SpO2: 99%   Filed Weights   10/22/19 1052  Weight: 231 lb 12.8 oz (105.1 kg)    GENERAL:alert, no distress and comfortable SKIN: No rash to exposed skin EYES: sclera clear OROPHARYNX: Erythema at the gumline, no active bleeding LUNGS: clear with normal breathing effort HEART: regular rate & rhythm ABDOMEN:abdomen soft, non-tender and normal bowel sounds Musculoskeletal: left posterior leg with mild ecchymoses with intact sutures and granulation tissue NEURO: alert & oriented x 3 with fluent speech,  decreased sensation to the right thigh per patient. Normal strength and gait   LABORATORY DATA:  I have reviewed the data as listed CBC Latest Ref Rng & Units 10/22/2019 10/20/2019 10/19/2019  WBC 4.0 - 10.5 K/uL 7.4 8.6 14.5(H)  Hemoglobin 12.0 - 15.0 g/dL 12/19/2019 08.6 76.1  Hematocrit 36 - 46 % 37.8 37.1 37.7  Platelets 150 - 400 K/uL 217 231 265     CMP Latest Ref Rng & Units 10/22/2019 10/15/2019 10/13/2019  Glucose 70 - 99 mg/dL 10/15/2019) 932(I) 712(W)  BUN 6 - 20 mg/dL 15 17 10   Creatinine 0.44 - 1.00 mg/dL 580(D 9.83  Sodium 135 - 145 mmol/L 135 132(L) 138  Potassium 3.5 - 5.1 mmol/L 3.8 4.4 3.4(L)  Chloride 98 - 111 mmol/L 105 106 105  CO2 22 - 32 mmol/L 24 21(L) 24  Calcium 8.9 - 10.3 mg/dL 9.1 3.82) 9.0  Total Protein 6.5 - 8.1 g/dL 9.3(H) - 8.0  Total Bilirubin 0.3 - 1.2 mg/dL 0.4 - 0.3  Alkaline Phos 38 - 126 U/L 52 - 55  AST 15 - 41 U/L 18 - 41  ALT 0 - 44 U/L 15 - 36      RADIOGRAPHIC STUDIES: I have personally reviewed the radiological images as listed and agreed with the findings in the report. No results found.  ASSESSMENT & PLAN: 44 yo female with   1. 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  She was bridged with Lovenox and discharged 10/10/19 on Coumadin.   -She was scheduled to begin outpatient Rituxan for refractory ITP on 7/26 but presented to ED with severe thrombocytopenia platelet less than 5K and gum bleeding.   -CTA showed at least submassive bilateral PE. Vascular surgery did not recommend IVC filter due to risk for occluding her left iliac vein stent while off anticoagulation for the procedure, then occluding her IVC filter therefore leaving with no  surgical options.   -Treated aggressively for ITP including platelet transfusion, 40 mg Dex daily for 4 days, IVIG, and Nplate.  Anticoagulation was held until platelets greater than 30 then received heparin without bolus.   -S/p first dose of Rituxan for refractory ITP on 7/29 in the hospital, plan for weekly x4.   -From inpatient notes appears she started 7.5 mg coumadin on 7/28 or 7/29 -She continued heparin and Coumadin until INR therapeutic, platelets significantly improved.  She developed vaginal bleeding and bridging heparin was stopped, reached therapeutic INR 2.0 on 8/1.  -she has been referred to women's clinic for bleeding, she was given phone # to call  -Discharged on 8/2 on Coumadin 7.5 mg daily.  Denies missed or extra doses. INR therapeutic today 2.4 (goal 2-3).  2. Recurrent ITP -She has a history of recurrentand refractoryITP. She is s/p splenectomy and treated withsteroid, IVIG,NPlate,Rituxanand chemoin the past (the first episode) dueto refractory disease -In 02/2018 she washospitalized for smallsubarachnoid hemorrhageand severe thrombocytosisfrom ITP.She responded to dexamethasone, IVIG, and Nplate quickly in a few days. -She has had multiple hospitalization for acute ITP flare, and recently developed DVT/PE due to May Thurner syndrome and Nplate  -Due to her frequent recurrenceof ITP, Dr. Mosetta Putt recommended restarting Rituxan weekly x4 to better control disease, started first dose 10/16/19 in the hospital  -holding Nplate due to thrombosis, may consider if plt drops <5K -she has mild oral, vaginal, and rectal bleeding from hemorrhoids. plt 217K today  3.History of small foci ofsubarachnoid hemorrhage,Secondary to #2 -recovered well overall.Will continue to monitor. -multiple head CTs have been stable lately   4.Headaches, dizziness -Chronic, since 03/2019 hospitalization. She has been seen by Dr. Barbaraann Cao who felt symptoms were consistent with occipital  neuralgia. He recommended referral to Dr. Ollen Bowl for occipital nerve blockade  -she has since been referred to neuro, consult is pending   5. COVID19 (+) in early 04/2019 -She notes she had mild to moderate breathing issues. No fever. She was treated monoclonial antibodies.  -She has overall recovered well.She is fine to proceed with COVID19 vaccine.  6. Financial Support  -She pays out of pocket but submits her bills and receives pay assistance. -Her application for Medicaid was denied  7. Depression -Has tried citalopram and sertraline in the past, recently started on duloxetine in the hospital but is not tolerating due to weakness/fatigue.  -will request PCP assistance to manage this, I sent Dr. Ashok Pall a message today  Disposition: [name] appears stable.  She continues to recover from recent hospitalizations for acute on chronic ITP, DVT s/p left iliac stent and thrombectomy, and acute PE. She continues to have  minimal oral, vaginal, and rectal bleeding.  She has been referred to women's health clinic to discuss treatment options for vaginal bleeding such as ablation.  She is not a candidate for oral contraceptive due to history of thrombosis.  She is requesting a referral to GI for bleeding hemorrhoids, order placed today.  Her platelet count remains normal at 217K today.  She is tolerating Coumadin 7.5 mg daily.  INR is therapeutic at 2.4 (goal 2-3), she will continue current dose of Coumadin 7.5 mg daily.  She has been on this dose for approximately a week, I will repeat INR when she returns for Rituxan tomorrow to be sure she remains therapeutic.  She will return for lab and third weekly Rituxan next week, follow-up with fourth dose in 2 weeks.  She has follow-up with vascular surgery on 8/24.  She knows to call sooner if she has any new or worsening concerns such as recurrent bleeding, dyspnea, or side effects from Rituxan. I reviewed the plan with Dr. Mosetta PuttFeng.     Orders  Placed This Encounter  Procedures  . Protime-INR    Standing Status:   Future    Standing Expiration Date:   10/21/2020  . Ambulatory referral to Gastroenterology    Referral Priority:   Routine    Referral Type:   Consultation    Referral Reason:   Specialty Services Required    Number of Visits Requested:   1   All questions were answered. The patient knows to call the clinic with any problems, questions or concerns. No barriers to learning was detected. Total encounter time was 35 minutes.      Pollyann SamplesLacie K Florence Yeung, NP 10/22/19

## 2019-10-22 ENCOUNTER — Inpatient Hospital Stay (HOSPITAL_BASED_OUTPATIENT_CLINIC_OR_DEPARTMENT_OTHER): Payer: Self-pay | Admitting: Nurse Practitioner

## 2019-10-22 ENCOUNTER — Telehealth: Payer: Self-pay | Admitting: Nurse Practitioner

## 2019-10-22 ENCOUNTER — Other Ambulatory Visit: Payer: Self-pay

## 2019-10-22 ENCOUNTER — Inpatient Hospital Stay: Payer: Self-pay | Attending: Nurse Practitioner

## 2019-10-22 ENCOUNTER — Encounter: Payer: Self-pay | Admitting: Nurse Practitioner

## 2019-10-22 VITALS — BP 113/77 | HR 95 | Temp 97.8°F | Resp 18 | Ht <= 58 in | Wt 231.8 lb

## 2019-10-22 DIAGNOSIS — R531 Weakness: Secondary | ICD-10-CM | POA: Insufficient documentation

## 2019-10-22 DIAGNOSIS — Z79899 Other long term (current) drug therapy: Secondary | ICD-10-CM | POA: Insufficient documentation

## 2019-10-22 DIAGNOSIS — R5383 Other fatigue: Secondary | ICD-10-CM | POA: Insufficient documentation

## 2019-10-22 DIAGNOSIS — D693 Immune thrombocytopenic purpura: Secondary | ICD-10-CM

## 2019-10-22 DIAGNOSIS — Z7901 Long term (current) use of anticoagulants: Secondary | ICD-10-CM | POA: Insufficient documentation

## 2019-10-22 DIAGNOSIS — Z8616 Personal history of COVID-19: Secondary | ICD-10-CM | POA: Insufficient documentation

## 2019-10-22 DIAGNOSIS — R519 Headache, unspecified: Secondary | ICD-10-CM | POA: Insufficient documentation

## 2019-10-22 DIAGNOSIS — R269 Unspecified abnormalities of gait and mobility: Secondary | ICD-10-CM | POA: Insufficient documentation

## 2019-10-22 DIAGNOSIS — Z9081 Acquired absence of spleen: Secondary | ICD-10-CM | POA: Insufficient documentation

## 2019-10-22 DIAGNOSIS — Z5112 Encounter for antineoplastic immunotherapy: Secondary | ICD-10-CM | POA: Insufficient documentation

## 2019-10-22 DIAGNOSIS — R42 Dizziness and giddiness: Secondary | ICD-10-CM | POA: Insufficient documentation

## 2019-10-22 DIAGNOSIS — M5416 Radiculopathy, lumbar region: Secondary | ICD-10-CM | POA: Insufficient documentation

## 2019-10-22 DIAGNOSIS — F329 Major depressive disorder, single episode, unspecified: Secondary | ICD-10-CM | POA: Insufficient documentation

## 2019-10-22 DIAGNOSIS — I82422 Acute embolism and thrombosis of left iliac vein: Secondary | ICD-10-CM | POA: Insufficient documentation

## 2019-10-22 DIAGNOSIS — I2699 Other pulmonary embolism without acute cor pulmonale: Secondary | ICD-10-CM | POA: Insufficient documentation

## 2019-10-22 DIAGNOSIS — Z86718 Personal history of other venous thrombosis and embolism: Secondary | ICD-10-CM | POA: Insufficient documentation

## 2019-10-22 DIAGNOSIS — I871 Compression of vein: Secondary | ICD-10-CM

## 2019-10-22 LAB — CMP (CANCER CENTER ONLY)
ALT: 15 U/L (ref 0–44)
AST: 18 U/L (ref 15–41)
Albumin: 3.1 g/dL — ABNORMAL LOW (ref 3.5–5.0)
Alkaline Phosphatase: 52 U/L (ref 38–126)
Anion gap: 6 (ref 5–15)
BUN: 15 mg/dL (ref 6–20)
CO2: 24 mmol/L (ref 22–32)
Calcium: 9.1 mg/dL (ref 8.9–10.3)
Chloride: 105 mmol/L (ref 98–111)
Creatinine: 0.78 mg/dL (ref 0.44–1.00)
GFR, Est AFR Am: >60 mL/min (ref >60)
GFR, Estimated: >60 mL/min (ref >60)
Glucose, Bld: 119 mg/dL — ABNORMAL HIGH (ref 70–99)
Potassium: 3.8 mmol/L (ref 3.5–5.1)
Sodium: 135 mmol/L (ref 135–145)
Total Bilirubin: 0.4 mg/dL (ref 0.3–1.2)
Total Protein: 9.3 g/dL — ABNORMAL HIGH (ref 6.5–8.1)

## 2019-10-22 LAB — CBC WITH DIFFERENTIAL (CANCER CENTER ONLY)
Abs Immature Granulocytes: 0.08 10*3/uL — ABNORMAL HIGH (ref 0.00–0.07)
Basophils Absolute: 0 10*3/uL (ref 0.0–0.1)
Basophils Relative: 0 %
Eosinophils Absolute: 0.1 10*3/uL (ref 0.0–0.5)
Eosinophils Relative: 1 %
HCT: 37.8 % (ref 36.0–46.0)
Hemoglobin: 12 g/dL (ref 12.0–15.0)
Immature Granulocytes: 1 %
Lymphocytes Relative: 27 %
Lymphs Abs: 2 10*3/uL (ref 0.7–4.0)
MCH: 28 pg (ref 26.0–34.0)
MCHC: 31.7 g/dL (ref 30.0–36.0)
MCV: 88.1 fL (ref 80.0–100.0)
Monocytes Absolute: 0.7 10*3/uL (ref 0.1–1.0)
Monocytes Relative: 9 %
Neutro Abs: 4.6 10*3/uL (ref 1.7–7.7)
Neutrophils Relative %: 62 %
Platelet Count: 217 10*3/uL (ref 150–400)
RBC: 4.29 MIL/uL (ref 3.87–5.11)
RDW: 15.4 % (ref 11.5–15.5)
WBC Count: 7.4 10*3/uL (ref 4.0–10.5)
nRBC: 4.3 % — ABNORMAL HIGH (ref 0.0–0.2)

## 2019-10-22 LAB — PROTIME-INR
INR: 2.4 — ABNORMAL HIGH (ref 0.8–1.2)
Prothrombin Time: 25.3 seconds — ABNORMAL HIGH (ref 11.4–15.2)

## 2019-10-22 NOTE — Telephone Encounter (Signed)
Scheduled per 8/4 los. Used language line interpreter to let pt know that lab appt was added and tx after. Unable to contact pt. Left voicemail with appt time and date.

## 2019-10-23 ENCOUNTER — Encounter: Payer: Self-pay | Admitting: Nurse Practitioner

## 2019-10-23 ENCOUNTER — Ambulatory Visit: Payer: Self-pay | Admitting: Nurse Practitioner

## 2019-10-23 ENCOUNTER — Inpatient Hospital Stay: Payer: Self-pay

## 2019-10-23 ENCOUNTER — Other Ambulatory Visit: Payer: Self-pay

## 2019-10-23 ENCOUNTER — Ambulatory Visit: Payer: Self-pay

## 2019-10-23 VITALS — BP 101/63 | HR 69 | Temp 98.1°F | Resp 16

## 2019-10-23 DIAGNOSIS — I82402 Acute embolism and thrombosis of unspecified deep veins of left lower extremity: Secondary | ICD-10-CM

## 2019-10-23 DIAGNOSIS — D693 Immune thrombocytopenic purpura: Secondary | ICD-10-CM

## 2019-10-23 DIAGNOSIS — I871 Compression of vein: Secondary | ICD-10-CM

## 2019-10-23 LAB — CBC WITH DIFFERENTIAL (CANCER CENTER ONLY)
Abs Immature Granulocytes: 0.07 10*3/uL (ref 0.00–0.07)
Basophils Absolute: 0 10*3/uL (ref 0.0–0.1)
Basophils Relative: 0 %
Eosinophils Absolute: 0.1 10*3/uL (ref 0.0–0.5)
Eosinophils Relative: 1 %
HCT: 35.9 % — ABNORMAL LOW (ref 36.0–46.0)
Hemoglobin: 11.3 g/dL — ABNORMAL LOW (ref 12.0–15.0)
Immature Granulocytes: 1 %
Lymphocytes Relative: 21 %
Lymphs Abs: 1.6 10*3/uL (ref 0.7–4.0)
MCH: 27.6 pg (ref 26.0–34.0)
MCHC: 31.5 g/dL (ref 30.0–36.0)
MCV: 87.8 fL (ref 80.0–100.0)
Monocytes Absolute: 0.8 10*3/uL (ref 0.1–1.0)
Monocytes Relative: 12 %
Neutro Abs: 4.7 10*3/uL (ref 1.7–7.7)
Neutrophils Relative %: 65 %
Platelet Count: 181 10*3/uL (ref 150–400)
RBC: 4.09 MIL/uL (ref 3.87–5.11)
RDW: 15.1 % (ref 11.5–15.5)
WBC Count: 7.3 10*3/uL (ref 4.0–10.5)
nRBC: 1.9 % — ABNORMAL HIGH (ref 0.0–0.2)

## 2019-10-23 LAB — CMP (CANCER CENTER ONLY)
ALT: 12 U/L (ref 0–44)
AST: 16 U/L (ref 15–41)
Albumin: 2.9 g/dL — ABNORMAL LOW (ref 3.5–5.0)
Alkaline Phosphatase: 77 U/L (ref 38–126)
Anion gap: 7 (ref 5–15)
BUN: 16 mg/dL (ref 6–20)
CO2: 23 mmol/L (ref 22–32)
Calcium: 9.4 mg/dL (ref 8.9–10.3)
Chloride: 106 mmol/L (ref 98–111)
Creatinine: 0.79 mg/dL (ref 0.44–1.00)
GFR, Est AFR Am: >60 mL/min (ref >60)
GFR, Estimated: >60 mL/min (ref >60)
Glucose, Bld: 122 mg/dL — ABNORMAL HIGH (ref 70–99)
Potassium: 3.6 mmol/L (ref 3.5–5.1)
Sodium: 136 mmol/L (ref 135–145)
Total Bilirubin: 0.2 mg/dL — ABNORMAL LOW (ref 0.3–1.2)
Total Protein: 8.6 g/dL — ABNORMAL HIGH (ref 6.5–8.1)

## 2019-10-23 LAB — PROTIME-INR
INR: 2.5 — ABNORMAL HIGH (ref 0.8–1.2)
Prothrombin Time: 26.3 seconds — ABNORMAL HIGH (ref 11.4–15.2)

## 2019-10-23 MED ORDER — ACETAMINOPHEN 325 MG PO TABS
ORAL_TABLET | ORAL | Status: AC
  Filled 2019-10-23: qty 2

## 2019-10-23 MED ORDER — SODIUM CHLORIDE 0.9 % IV SOLN
375.0000 mg/m2 | Freq: Once | INTRAVENOUS | Status: AC
  Administered 2019-10-23: 800 mg via INTRAVENOUS
  Filled 2019-10-23: qty 50

## 2019-10-23 MED ORDER — SODIUM CHLORIDE 0.9 % IV SOLN
Freq: Once | INTRAVENOUS | Status: AC
  Filled 2019-10-23: qty 250

## 2019-10-23 MED ORDER — ACETAMINOPHEN 325 MG PO TABS
650.0000 mg | ORAL_TABLET | Freq: Once | ORAL | Status: AC
  Administered 2019-10-23: 650 mg via ORAL

## 2019-10-23 MED ORDER — SODIUM CHLORIDE 0.9 % IV SOLN
20.0000 mg | Freq: Once | INTRAVENOUS | Status: AC
  Administered 2019-10-23: 20 mg via INTRAVENOUS
  Filled 2019-10-23: qty 20

## 2019-10-23 MED ORDER — DIPHENHYDRAMINE HCL 25 MG PO CAPS
ORAL_CAPSULE | ORAL | Status: AC
  Filled 2019-10-23: qty 2

## 2019-10-23 MED ORDER — FAMOTIDINE IN NACL 20-0.9 MG/50ML-% IV SOLN
INTRAVENOUS | Status: AC
  Filled 2019-10-23: qty 50

## 2019-10-23 MED ORDER — FAMOTIDINE IN NACL 20-0.9 MG/50ML-% IV SOLN
20.0000 mg | Freq: Once | INTRAVENOUS | Status: AC
  Administered 2019-10-23: 20 mg via INTRAVENOUS

## 2019-10-23 MED ORDER — DIPHENHYDRAMINE HCL 25 MG PO CAPS
50.0000 mg | ORAL_CAPSULE | Freq: Once | ORAL | Status: AC
  Administered 2019-10-23: 50 mg via ORAL

## 2019-10-23 NOTE — Progress Notes (Signed)
She has been taking 7.5 mg Coumadin once daily since 7/29, INR continues to rise now 2.5 today, 2.4 on 8/4, 2.2 on 8/3.  Although she is therapeutic I recommend for her to change her regimen to include 7.5 mg daily except 5 mg on Tuesday/Thursday starting today. She will return for lab and Rituxan next week, will monitor INR closely.   Santiago Glad, NP

## 2019-10-23 NOTE — Patient Instructions (Signed)
[facility] Discharge Instructions for Patients Receiving Chemotherapy  Today you received the following chemotherapy agents rituxan.   To help prevent nausea and vomiting after your treatment, we encourage you to take your nausea medication as directed.     If you develop nausea and vomiting that is not controlled by your nausea medication, call the clinic.   BELOW ARE SYMPTOMS THAT SHOULD BE REPORTED IMMEDIATELY:  *FEVER GREATER THAN 100.5 F  *CHILLS WITH OR WITHOUT FEVER  NAUSEA AND VOMITING THAT IS NOT CONTROLLED WITH YOUR NAUSEA MEDICATION  *UNUSUAL SHORTNESS OF BREATH  *UNUSUAL BRUISING OR BLEEDING  TENDERNESS IN MOUTH AND THROAT WITH OR WITHOUT PRESENCE OF ULCERS  *URINARY PROBLEMS  *BOWEL PROBLEMS  UNUSUAL RASH Items with * indicate a potential emergency and should be followed up as soon as possible.  Feel free to call the clinic you have any questions or concerns. The clinic phone number is [phone].  

## 2019-10-24 ENCOUNTER — Other Ambulatory Visit: Payer: Self-pay

## 2019-10-24 ENCOUNTER — Encounter: Payer: Self-pay | Admitting: Physician Assistant

## 2019-10-24 DIAGNOSIS — D693 Immune thrombocytopenic purpura: Secondary | ICD-10-CM

## 2019-10-28 ENCOUNTER — Encounter: Payer: Self-pay | Admitting: Physician Assistant

## 2019-10-30 ENCOUNTER — Inpatient Hospital Stay: Payer: Self-pay

## 2019-10-30 ENCOUNTER — Other Ambulatory Visit: Payer: Self-pay | Admitting: Hematology

## 2019-10-30 ENCOUNTER — Ambulatory Visit: Payer: Self-pay

## 2019-10-30 ENCOUNTER — Encounter: Payer: Self-pay | Admitting: Hematology

## 2019-10-30 ENCOUNTER — Other Ambulatory Visit: Payer: Self-pay

## 2019-10-30 ENCOUNTER — Inpatient Hospital Stay (HOSPITAL_BASED_OUTPATIENT_CLINIC_OR_DEPARTMENT_OTHER): Payer: Self-pay | Admitting: Hematology

## 2019-10-30 VITALS — BP 100/60 | HR 80 | Temp 98.6°F | Resp 16

## 2019-10-30 DIAGNOSIS — I82402 Acute embolism and thrombosis of unspecified deep veins of left lower extremity: Secondary | ICD-10-CM

## 2019-10-30 DIAGNOSIS — D693 Immune thrombocytopenic purpura: Secondary | ICD-10-CM

## 2019-10-30 DIAGNOSIS — Z5181 Encounter for therapeutic drug level monitoring: Secondary | ICD-10-CM

## 2019-10-30 LAB — CBC WITH DIFFERENTIAL (CANCER CENTER ONLY)
Abs Immature Granulocytes: 0.05 10*3/uL (ref 0.00–0.07)
Basophils Absolute: 0.1 10*3/uL (ref 0.0–0.1)
Basophils Relative: 1 %
Eosinophils Absolute: 0.1 10*3/uL (ref 0.0–0.5)
Eosinophils Relative: 1 %
HCT: 36.4 % (ref 36.0–46.0)
Hemoglobin: 11.9 g/dL — ABNORMAL LOW (ref 12.0–15.0)
Immature Granulocytes: 1 %
Lymphocytes Relative: 25 %
Lymphs Abs: 1.9 10*3/uL (ref 0.7–4.0)
MCH: 28.7 pg (ref 26.0–34.0)
MCHC: 32.7 g/dL (ref 30.0–36.0)
MCV: 87.9 fL (ref 80.0–100.0)
Monocytes Absolute: 0.7 10*3/uL (ref 0.1–1.0)
Monocytes Relative: 9 %
Neutro Abs: 4.6 10*3/uL (ref 1.7–7.7)
Neutrophils Relative %: 63 %
Platelet Count: 220 10*3/uL (ref 150–400)
RBC: 4.14 MIL/uL (ref 3.87–5.11)
RDW: 15.6 % — ABNORMAL HIGH (ref 11.5–15.5)
WBC Count: 7.4 10*3/uL (ref 4.0–10.5)
nRBC: 1.5 % — ABNORMAL HIGH (ref 0.0–0.2)

## 2019-10-30 LAB — CMP (CANCER CENTER ONLY)
ALT: 17 U/L (ref 0–44)
AST: 17 U/L (ref 15–41)
Albumin: 3.1 g/dL — ABNORMAL LOW (ref 3.5–5.0)
Alkaline Phosphatase: 72 U/L (ref 38–126)
Anion gap: 16 — ABNORMAL HIGH (ref 5–15)
BUN: 10 mg/dL (ref 6–20)
CO2: 16 mmol/L — ABNORMAL LOW (ref 22–32)
Calcium: 9.8 mg/dL (ref 8.9–10.3)
Chloride: 103 mmol/L (ref 98–111)
Creatinine: 0.65 mg/dL (ref 0.44–1.00)
GFR, Est AFR Am: >60 mL/min (ref >60)
GFR, Estimated: >60 mL/min (ref >60)
Glucose, Bld: 114 mg/dL — ABNORMAL HIGH (ref 70–99)
Potassium: 4.1 mmol/L (ref 3.5–5.1)
Sodium: 135 mmol/L (ref 135–145)
Total Bilirubin: <0.2 mg/dL — ABNORMAL LOW (ref 0.3–1.2)
Total Protein: 8.8 g/dL — ABNORMAL HIGH (ref 6.5–8.1)

## 2019-10-30 LAB — PROTIME-INR
INR: 3.3 — ABNORMAL HIGH (ref 0.8–1.2)
Prothrombin Time: 32.3 seconds — ABNORMAL HIGH (ref 11.4–15.2)

## 2019-10-30 MED ORDER — ACETAMINOPHEN 325 MG PO TABS
650.0000 mg | ORAL_TABLET | Freq: Once | ORAL | Status: AC
  Administered 2019-10-30: 650 mg via ORAL

## 2019-10-30 MED ORDER — SODIUM CHLORIDE 0.9 % IV SOLN
375.0000 mg/m2 | Freq: Once | INTRAVENOUS | Status: AC
  Administered 2019-10-30: 800 mg via INTRAVENOUS
  Filled 2019-10-30: qty 50

## 2019-10-30 MED ORDER — FAMOTIDINE IN NACL 20-0.9 MG/50ML-% IV SOLN
20.0000 mg | Freq: Once | INTRAVENOUS | Status: AC
  Administered 2019-10-30: 20 mg via INTRAVENOUS

## 2019-10-30 MED ORDER — SODIUM CHLORIDE 0.9 % IV SOLN
20.0000 mg | Freq: Once | INTRAVENOUS | Status: AC
  Administered 2019-10-30: 20 mg via INTRAVENOUS
  Filled 2019-10-30: qty 20

## 2019-10-30 MED ORDER — ACETAMINOPHEN 325 MG PO TABS
ORAL_TABLET | ORAL | Status: AC
  Filled 2019-10-30: qty 2

## 2019-10-30 MED ORDER — FAMOTIDINE IN NACL 20-0.9 MG/50ML-% IV SOLN
INTRAVENOUS | Status: AC
  Filled 2019-10-30: qty 50

## 2019-10-30 MED ORDER — SODIUM CHLORIDE 0.9 % IV SOLN
Freq: Once | INTRAVENOUS | Status: AC
  Filled 2019-10-30: qty 250

## 2019-10-30 MED ORDER — DIPHENHYDRAMINE HCL 25 MG PO CAPS
50.0000 mg | ORAL_CAPSULE | Freq: Once | ORAL | Status: AC
  Administered 2019-10-30: 50 mg via ORAL

## 2019-10-30 MED ORDER — DIPHENHYDRAMINE HCL 25 MG PO CAPS
ORAL_CAPSULE | ORAL | Status: AC
  Filled 2019-10-30: qty 2

## 2019-10-30 MED ORDER — SODIUM CHLORIDE 0.9% FLUSH
10.0000 mL | INTRAVENOUS | Status: DC | PRN
  Filled 2019-10-30: qty 10

## 2019-10-30 MED ORDER — HEPARIN SOD (PORK) LOCK FLUSH 100 UNIT/ML IV SOLN
500.0000 [IU] | Freq: Once | INTRAVENOUS | Status: DC | PRN
  Filled 2019-10-30: qty 5

## 2019-10-30 MED ORDER — WARFARIN SODIUM 5 MG PO TABS: ORAL_TABLET | ORAL | 0 refills | Status: DC

## 2019-10-30 NOTE — Patient Instructions (Signed)
[facility] Discharge Instructions for Patients Receiving Chemotherapy  Today you received the following chemotherapy agents rituxan.   To help prevent nausea and vomiting after your treatment, we encourage you to take your nausea medication as directed.     If you develop nausea and vomiting that is not controlled by your nausea medication, call the clinic.   BELOW ARE SYMPTOMS THAT SHOULD BE REPORTED IMMEDIATELY:  *FEVER GREATER THAN 100.5 F  *CHILLS WITH OR WITHOUT FEVER  NAUSEA AND VOMITING THAT IS NOT CONTROLLED WITH YOUR NAUSEA MEDICATION  *UNUSUAL SHORTNESS OF BREATH  *UNUSUAL BRUISING OR BLEEDING  TENDERNESS IN MOUTH AND THROAT WITH OR WITHOUT PRESENCE OF ULCERS  *URINARY PROBLEMS  *BOWEL PROBLEMS  UNUSUAL RASH Items with * indicate a potential emergency and should be followed up as soon as possible.  Feel free to call the clinic you have any questions or concerns. The clinic phone number is [phone].  

## 2019-10-30 NOTE — Progress Notes (Signed)
[facility]   Telephone:(336) [phone] Fax:(336) [phone]   Clinic Follow up Note   Patient Care Team: [name], Yahel Fuston, MD as PCP - General (Hematology) Levert FeinsteinGranfortuna, James M, MD as Consulting Physician (Oncology) Gardiner Coinswen, John (Hematology and Oncology) Elwin MochaShah, Christopher Tulip, MD as Consulting Physician (Ophthalmology) Justice DeedsMetjian, Ara D, MD (Internal Medicine)  Date of Service:  10/30/2019  CHIEF COMPLAINT: F/u of refractory ITP, acute submassive bilateral PE, left DVT    CURRENT THERAPY:  -Promacta 25mg  daily starting 10/22/18. Stopped 04/08/18 due to recurrent flares.  -Nplateq2weeks starting in 2 weeks. Increased to weekly on 08/11/19 due to recurrent ITP flares.Willgive her 2 mcg/kg if platelet normal, and increase to 3 mcg/kg if platelet less than 150K. Increased to 163mcg/kg starting 09/03/19.Increased to 574mcg/kg on 09/10/19 and increase again to 16mcg/kg starting 09/17/19. Due to DVT and PE, hold Nplate since 10/13/19 unless plt <5K -periodic use of steroids (decadron) and IVIG  -Rituxan weekly X4, received first dose in hospital 10/16/2019-11/06/19 -Coumadin starting 10/15/19   INTERVAL HISTORY:  [name] is here for a follow up. She presents to the clinic with Spanish interpretor. She notes she started bleeding this morning in her gums. She has left abdominal pain since yesterday. The pain is like a burning sensation from inside her stomach and will be there constantly. Her pain is 6/10  She notes BM are good. She denies constipation or nausea or pain in back or sides. She notes she no longer her had period. Her period lasted 5 days.    REVIEW OF SYSTEMS:   Constitutional: Denies fevers, chills or abnormal weight loss Eyes: Denies blurriness of vision Ears, nose, mouth, throat, and face: Denies mucositis or sore throat (+) Gum bleeding  Respiratory: Denies cough, dyspnea or wheezes Cardiovascular: Denies palpitation, chest discomfort or lower extremity  swelling Gastrointestinal:  Denies nausea, heartburn or change in bowel habits (+) Left abdominal pain, 6/10 Skin: Denies abnormal skin rashes Lymphatics: Denies new lymphadenopathy or easy bruising Neurological:Denies numbness, tingling or new weaknesses Behavioral/Psych: Mood is stable, no new changes  All other systems were reviewed with the patient and are negative.  MEDICAL HISTORY:  Past Medical History:  Diagnosis Date  . Anemia   . Chronic ITP (idiopathic thrombocytopenia) (HCC)   . Depression   . Headache   . 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 BREAST SURGERY  biopsy  . LOWER EXTREMITY VENOGRAPHY Left 10/08/2019   Procedure: LOWER EXTREMITY VENOGRAPHY;  Surgeon: Cephus Shellinglark, Christopher J, MD;  Location: [facility] INVASIVE CV [facility];  Service: Cardiovascular;  Laterality: Left;  . PERIPHERAL VASCULAR INTERVENTION Left 10/08/2019   Procedure: PERIPHERAL VASCULAR INTERVENTION;  Surgeon: Cephus Shellinglark, Christopher J, MD;  [facility];  Service: Cardiovascular;  Laterality: Left;  LT COMMON ILIAC VEIN  . PERIPHERAL VASCULAR THROMBECTOMY N/A 10/08/2019   Procedure: PERIPHERAL VASCULAR THROMBECTOMY;  Surgeon: Cephus Shellinglark, Christopher J, MD;  Location: [facility] INVASIVE CV [facility];  Service: Cardiovascular;  Laterality: N/A;  . 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  ALLERGIES:  has No Known Allergies.  MEDICATIONS:  Current Outpatient Medications  Medication Sig Dispense Refill  . DULoxetine (CYMBALTA) 20 MG capsule  Take 1 capsule (20 mg total) by mouth daily. 30 capsule 0  . pantoprazole (PROTONIX) 40 MG tablet Take 1 tablet (40 mg total) by mouth daily.  30 tablet 0  . warfarin (COUMADIN) 5 MG tablet 7.5mg  TuWedThSaSun and 5mg  MF 32 tablet 0   No current facility-administered medications for this visit.    PHYSICAL EXAMINATION: ECOG PERFORMANCE STATUS: 2 - Symptomatic, <50% confined to bed  There were no vitals filed for this visit. There were no vitals filed for this visit.  GENERAL:alert, no distress and comfortable SKIN: skin color, texture, turgor are normal, no rashes or significant lesions EYES: normal, Conjunctiva are pink and non-injected, sclera clear OROPHARYNX:no exudate, no erythema and lips, buccal mucosa, and tongue normal (+) Mild gum bleeding NECK: supple, thyroid normal size, non-tender, without nodularity LYMPH:  no palpable lymphadenopathy in the cervical, axillary LUNGS: clear to auscultation and percussion with normal breathing effort HEART: regular rate & rhythm and no murmurs and no lower extremity edema ABDOMEN:abdomen soft, non-tender and normal bowel sounds Musculoskeletal:no cyanosis of digits and no clubbing  NEURO: alert & oriented x 3 with fluent speech, no focal motor/sensory deficits  LABORATORY DATA:  I have reviewed the data as listed CBC Latest Ref Rng & Units 10/30/2019 10/23/2019 10/22/2019  WBC 4.0 - 10.5 K/uL 7.4 7.3 7.4  Hemoglobin 12.0 - 15.0 g/dL 11.9(L) 11.3(L) 12.0  Hematocrit 36 - 46 % 36.4 35.9(L) 37.8  Platelets 150 - 400 K/uL 220 181 217     CMP Latest Ref Rng & Units 10/30/2019 10/23/2019 10/22/2019  Glucose 70 - 99 mg/dL 12/22/2019) 621(H) 086(V)  BUN 6 - 20 mg/dL 10 16 15   Creatinine 0.44 - 1.00 mg/dL 784(O 9.62  Sodium 135 - 145 mmol/L 135 136 135  Potassium 3.5 - 5.1 mmol/L 4.1 3.6 3.8  Chloride 98 - 111 mmol/L 103 106 105  CO2 22 - 32 mmol/L 16(L) 23 24  Calcium 8.9 - 10.3 mg/dL 9.8 9.4 9.1  Total Protein 6.5 - 8.1 g/dL 9.52) 8.41) 3.2(G)  Total Bilirubin 0.3 - 1.2 mg/dL 4.0(N) 0.2(V) 0.4  Alkaline Phos 38 - 126 U/L 72 77 52  AST 15 - 41 U/L 17 16 18   ALT 0 - 44 U/L 17 12 15        RADIOGRAPHIC STUDIES: I have personally reviewed the radiological images as listed and agreed with the findings in the report. No results found.   ASSESSMENT & PLAN:  [name] is a 44 y.o. female with    1. Recurrent ITP -She has a history of recurrentand refractoryITP. She is s/p splenectomy and treated withsteroid, IVIG,NPlate,Rituxanand chemoin the past (the first episode) dueto refractory disease -In 02/2018 she washospitalized for smallsubarachnoid hemorrhageand severe thrombocytosisfrom ITP.She responded to dexamethasone, IVIG, and Nplate quickly in a few days. -She has had multiple hospitalization for acute ITP flare, last admission wasin 5/17/21for plt<5, she wastreated with IVIG, short course Dexa, and high dose Nplate. Plt improved to 88K by 08/07/19.She had acute flarewith plt 10K on 6/9, I treated as outpatient with dexa and increase dose Nplate,platelet went up to46K after two weeks. She did not respond fully.  -She is currently onNplateweekly,dose was increased to53mcg/kg6/30/21.  -She had another acute flare last week with plt 7K (09/17/19). She was treated as outpatient with Nplate 13mcg/kg and IVIG 1mg /kg. She did have better response this time, but did not last long. Plt improved to 65K over 2 days but 7 days later her plt decreased again to 23K today (  09/24/19).  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 Will proceed with week 3 Rituxan today.  -Continue weekly labs.  -F/u next week.    2. DVT, B/l PE -She has been repeatedly hospitalized in 09/2019 due to  acute on chronic ITP flares, DVT and PE.  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  -She was treated with percutaneous mechanical thrombectomy and stenting of the left leg per vascular surgery. She was bridged with Lovenox and discharged 10/10/19 on Coumadin.   -She was scheduled to begin outpatient Rituxan for refractory ITP on 7/26 but presented to ED with severe thrombocytopenia platelet less than 5K and gum bleeding.   -CTA showed at least submassive bilateral PE. Vascular surgery did not recommend IVC filter due to risk for occluding her left iliac vein stent while off anticoagulation for the procedure, then occluding her IVC filter therefore leaving with no surgical options.   -From inpatient notes appears she started 7.5 mg coumadin on 10/15/19 -She continued heparin and Coumadin until INR therapeutic, platelets significantly improved.  She developed vaginal bleeding and bridging heparin was stopped, reached therapeutic INR 2.0 on 8/1. Her period lasted 5 days and has stopped.  -Discharged on 10/20/19 on Coumadin 7.5 mg daily. -INR at 3.3 today (10/30/19), will reduce Coumadin to 5 mg Monday and Friday, 7.5 mg daily for the rest of week   2.History of small foci ofsubarachnoid hemorrhage,Secondary to #1 -recovered well overall.Will continue to monitor. -Multiple head CTs have been stable lately   3.Headaches, Chronic dizziness, Hypotension -Chronic HA, since 03/2019 hospitalization. She has been seen by Dr. Barbaraann Cao who felt symptoms were consistent with occipital neuralgia. He recommended referral to Dr. Ollen Bowl for occipital nerve blockade  -she has since been referred to neuro, consult is pending  -She has had dizziness since ITP Dx 4 years ago and improves with treatment.  -Istarted her onMeclizine (08/11/19).  4. COVID19 (+) in early 04/2019 -She notes she had mild to moderate breathing  issues. No fever. She was treated monoclonial antibodies.  -She has overall recovered well.She is fine to proceed with COVID19 vaccine.  5. Financial Support  -She pays out of pocket but submits her bills and receives pay assistance. -Her application for Medicaid was denied  6. Depression -Has tried citalopram and sertraline in the past, recently started on duloxetine in the hospital but is not tolerating due to weakness/fatigue.  -will request PCP assistance to manage this, I sent Dr. Ashok Pall a message today  7. Left low abdominal pain  -exam was unremarkable, BM normal, not other GI symptoms -H/H stable, no concern for intra-abdominal bleeding -Continue monitoring  PLAN: -Lab reviewed, Plt 220K, INR 3.3, will decrease Coumadin to 5 mg daily on Monday and Fridays, continue 7.5 mg daily for the rest of week -Proceed with week 3 Rituxan today  -Lab, F/u and Rituxan next week    No problem-specific Assessment & Plan notes found for this encounter.   No orders of the defined types were placed in this encounter.  All questions were answered. The patient  knows to call the clinic with any problems, questions or concerns. No barriers to learning was detected. The total time spent in the appointment was 30 minutes.     Malachy Mood, MD 10/30/2019   I, Delphina Cahill, am acting as scribe for Malachy Mood, MD.   I have reviewed the above documentation for accuracy and completeness, and I agree with the above.

## 2019-10-30 NOTE — Progress Notes (Signed)
I spoke with Bridget Hartshorn, [name]'s daughter.  She states her mother is taking Coumadin 7.5 mg daily.  I reviewed Dr. Latanya Maudlin instructions to take coumadin 7.5 mg T-W-R-S-S and 5mg  M-F.  She verbalized understanding.

## 2019-10-30 NOTE — Progress Notes (Signed)
Per Dr. Mosetta Putt patient is to change coumadin regimen. Per her note to nurse "I will decrease coumadin to 5mg  on Mondays and Fridays, cont 7.5mg  daily for the rest of week"  The patient and daughter verbalized understanding and repeated back the instructions.  Also wrote on medication list.

## 2019-11-03 ENCOUNTER — Other Ambulatory Visit: Payer: Self-pay

## 2019-11-03 DIAGNOSIS — Z1231 Encounter for screening mammogram for malignant neoplasm of breast: Secondary | ICD-10-CM

## 2019-11-05 NOTE — Progress Notes (Signed)
[facility]   [phone] [phone]   Clinic Follow up Note   Patient Care Team: Malachy Mood, MD as PCP - General (Hematology) Levert Feinstein, MD as Consulting Physician (Oncology) [name] (Hematology and Oncology) Elwin Mocha, MD as Consulting Physician (Ophthalmology) Justice Deeds, MD (Internal Medicine)  Date of Service:  11/06/2019  CHIEF COMPLAINT: F/u ofrefractoryITP, acutesubmassive bilateralPE, left DVT   CURRENT THERAPY:  -Promacta 25mg  daily starting 10/22/18. Stopped 04/08/18 due to recurrent flares.  -Nplateq2weeks starting in 2 weeks. Increased to weekly on 08/11/19 due to recurrent ITP flares.Willgive her 2 mcg/kg if platelet normal, and increase to 3 mcg/kg if platelet less than 150K. Increased to 23mcg/kg starting 09/03/19.Increased to 91mcg/kg on 09/10/19 and increase again to 39mcg/kg starting 09/17/19.Due to DVT and PE, holding Nplate since 10/13/19 unless plt <5K -Periodic use of steroids (decadron) and IVIG  -Rituxan weekly X4, received first dose in hospital 10/16/2019-11/06/19 -Coumadin starting 10/15/19  INTERVAL HISTORY:  [name] is here for a follow up of ET. She presents to the clinic with her daughter and her Spanish Interpretor. She notes she has persistent bleeding in her mouth. This bleeding is worse in the morning and cleans this through out the day several times. She denies epistaxis, vaginal bleeding to bleeding elsewhere. She has not been to dentist in a while. She is on Coumadin MF 5mg  and the rest of the week 7.5mg . She notes she has been feeling leg weakness and shaking. She has back pain as well. She has been having falls lately. She notes her headaches flare when she goes from laying down to sitting up. She notes she is no longer on Cymbalta as she felt more down and depressed on it.     REVIEW OF SYSTEMS:   Constitutional: Denies fevers, chills or abnormal weight loss (+)  Intermittent headaches.  Eyes: Denies blurriness of vision Ears, nose, mouth, throat, and face: Denies mucositis or sore throat (+) Mouth bleeding Respiratory: Denies cough, dyspnea or wheezes Cardiovascular: Denies palpitation, chest discomfort or lower extremity swelling Gastrointestinal:  Denies nausea, heartburn or change in bowel habits Skin: Denies abnormal skin rashes MSK: (+) Back pain Lymphatics: Denies new lymphadenopathy or easy bruising Neurological:Denies numbness, tingling (+) Leg and lower back weakness  Behavioral/Psych: (+) Depression All other systems were reviewed with the patient and are negative.  MEDICAL HISTORY:  Past Medical History:  Diagnosis Date  . Anemia   . Chronic ITP (idiopathic thrombocytopenia) (HCC)   . Depression   . Headache   . 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 BREAST SURGERY  biopsy  . LOWER EXTREMITY VENOGRAPHY Left 10/08/2019   Procedure: LOWER EXTREMITY VENOGRAPHY;  Surgeon: 10/07/2015, MD;  Location: [facility] INVASIVE CV LAB;  Service: Cardiovascular;  Laterality: Left;  . PERIPHERAL VASCULAR INTERVENTION Left 10/08/2019   Procedure: PERIPHERAL VASCULAR INTERVENTION;  Surgeon: Cephus Shelling, MD;  Location: [facility] INVASIVE CV LAB;  Service: Cardiovascular;  Laterality: Left;  LT COMMON ILIAC VEIN  . PERIPHERAL VASCULAR THROMBECTOMY N/A 10/08/2019   Procedure: PERIPHERAL VASCULAR THROMBECTOMY;  Surgeon: [facility], MD;  Location: [facility] INVASIVE CV LAB;  Service: Cardiovascular;  Laterality: N/A;  . SPLENECTOMY, TOTAL N/A 09/16/2015   Procedure: OPEN SPLENECTOMY;  Surgeon: Cephus Shelling III, MD;  Location: [facility] OR;  Service: General;  Laterality: N/A;    I have  reviewed the social history and family history with the patient and they are unchanged from previous note.  ALLERGIES:  has No Known  Allergies.  MEDICATIONS:  Current Outpatient Medications  Medication Sig Dispense Refill  . pantoprazole (PROTONIX) 40 MG tablet Take 1 tablet (40 mg total) by mouth daily. 30 tablet 0  . warfarin (COUMADIN) 5 MG tablet 7.5mg  daily on Mondays and Fridays, and 5mg  daily for the rest of week 32 tablet 0   No current facility-administered medications for this visit.    PHYSICAL EXAMINATION: ECOG PERFORMANCE STATUS: 2 - Symptomatic, <50% confined to bed  Vitals:   11/06/19 0835  BP: (!) 100/46  Pulse: 96  Resp: 17  Temp: 99.1 F (37.3 C)  SpO2: 99%   Filed Weights   11/06/19 0835  Weight: 242 lb 1.6 oz (109.8 kg)    GENERAL:alert, no distress and comfortable SKIN: skin color, texture, turgor are normal, no rashes or significant lesions EYES: normal, Conjunctiva are pink and non-injected, sclera clear OROPHARYNX:no exudate, no erythema and lips, buccal mucosa, and tongue normal (+) Traces of blood NECK: supple, thyroid normal size, non-tender, without nodularity LYMPH:  no palpable lymphadenopathy in the cervical, axillary LUNGS: clear to auscultation and percussion with normal breathing effort HEART: regular rate & rhythm and no murmurs and no lower extremity edema ABDOMEN:abdomen soft, non-tender and normal bowel sounds Musculoskeletal:no cyanosis of digits and no clubbing  NEURO: alert & oriented x 3 with fluent speech, no focal motor/sensory deficits  LABORATORY DATA:  I have reviewed the data as listed CBC Latest Ref Rng & Units 11/06/2019 10/30/2019 10/23/2019  WBC 4.0 - 10.5 K/uL 9.4 7.4 7.3  Hemoglobin 12.0 - 15.0 g/dL 11.7(L) 11.9(L) 11.3(L)  Hematocrit 36 - 46 % 36.5 36.4 35.9(L)  Platelets 150 - 400 K/uL 253 220 181     CMP Latest Ref Rng & Units 11/06/2019 10/30/2019 10/23/2019  Glucose 70 - 99 mg/dL 12/23/2019) 188(C) 166(A)  BUN 6 - 20 mg/dL 14 10 16   Creatinine 0.44 - 1.00 mg/dL 630(Z 6.01  Sodium 135 - 145 mmol/L 137 135 136  Potassium 3.5 - 5.1 mmol/L 3.7 4.1  3.6  Chloride 98 - 111 mmol/L 106 103 106  CO2 22 - 32 mmol/L 22 16(L) 23  Calcium 8.9 - 10.3 mg/dL 9.7 9.8 9.4  Total Protein 6.5 - 8.1 g/dL 8.0 0.93) 2.35)  Total Bilirubin 0.3 - 1.2 mg/dL 5.7(D) 2.2(G) <2.5(K)  Alkaline Phos 38 - 126 U/L 74 72 77  AST 15 - 41 U/L 13(L) 17 16  ALT 0 - 44 U/L 15 17 12       RADIOGRAPHIC STUDIES: I have personally reviewed the radiological images as listed and agreed with the findings in the report. No results found.   ASSESSMENT & PLAN:  [name] is a 44 y.o. female with    1. Recurrent ITP -She has a history of recurrentand refractoryITP. She is s/p splenectomy and treated withsteroid, IVIG,NPlate,Rituxanand chemoin the past (the first episode) dueto refractory disease -In 02/2018 she washospitalized for smallsubarachnoid hemorrhageand severe thrombocytosisfrom ITP.She responded to dexamethasone, IVIG, and Nplate quickly in a few days. -She has had multiple hospitalization for acute ITP flare, last admission wasin 5/17/21for plt<5, she wastreated with IVIG, short course Dexa, and high dose Nplate. Plt improved to 88K by 08/07/19.She had acute flarewith plt 10K on 6/9, I treatedas outpatientwith dexa and increase dose Nplate,platelet went up to46K after two weeks. She did not respond fully. -She is currently onNplateweekly,dose was  increased to566mcg/kg6/30/21. -She had another acute flare last week with plt 7K(09/17/19). She was treated as outpatient with Nplate 526mcg/kg and IVIG1mg /kg.She did have better response this time, but did not lastlong. Plt improved to 65K over 2 days but 7 days later her plt decreased again to 23K today (09/24/19).  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 -We have held Nplate since 10/13/19 due to thrombosis, may consider if plt drops <5K -Labs reviewed, Hg 11.7, plt normal at 235K, BG 121, albumin 3.1. Will proceed with 4th and last Rituxan today.  -Will monitor her labs. If her flares recur will restart Rituxan.  -Labs every 2 weeks  -F/u in 4 weeks    2. DVT, B/l PE -She has been repeatedly hospitalized in 09/2019 due to acute on chronic ITP flares, DVT and PE.  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  -She was treated with percutaneous mechanical thrombectomy and stenting of the left leg per vascular surgery. She was bridged with Lovenox and discharged 7/23/21on Coumadin. -She was scheduled to begin outpatient Rituxan for refractory ITP on 7/26 but presented to ED with severe thrombocytopenia platelet less than 5K and gum bleeding. -CTA showed at least submassive bilateral PE.Vascular surgery did not recommend IVC filter due to risk for occluding her left iliac vein stent while off anticoagulation for the procedure, then occluding her IVC filter therefore leaving with no surgical options. -Discharged on 10/20/19 on Coumadin 7.5 mg daily. On 10/30/19 her Coumadin reduced to 5 mg Monday and Friday, 7.5 mg daily for the rest of week.  -Her INR is 3.5 today (11/06/19). Hold Coumadin today and reduce to 5mg  everyday except MF take 7.5mg .  -She notes persistent mild gum bleeding this week even with normal platelets, no other bleeding. I discussed coumadin will increase her bleeding. I recommend she f/u with a Dentist as she has not been seen in a while.  -goal of INR 2-3   2.History of small foci ofsubarachnoid hemorrhage,Secondary to #1 -recovered well overall.Will continue to monitor. -Multiple head CTs have been stable lately  3.Headaches, Chronic dizziness, Hypotension -Chronic HA, since 03/2019  hospitalization. She has been seen by Dr. Barbaraann CaoVaslow who felt symptoms were consistent with occipital neuralgia. He recommended referral to Dr. Ollen BowlHarkins for occipital nerve blockade  -she has since been referred to neuro, consult is pending -She has had dizziness since ITP Dx 4 years ago and improves with treatment.  -Istarted her onMeclizine (08/11/19). -Her current headaches are intermittent and triggered/flare when she goes from laying down to sitting up.  4. COVID19 (+) in early 04/2019 -She notes she had mild to moderate breathing issues. No fever. She was treated monoclonial antibodies.  -She has overall recovered well.She is fine to proceed with COVID19 vaccine.  5. Financial Support  -She pays out of pocket but submits her bills and receives pay assistance. -Her application for Medicaid was denied -She does not have orange card currently. She was approved for drug assistance grant, but has not used it yet. Blake DivineShauna will f/u with her. She will also f/u with our financial office.    6. Depression -Has tried citalopram and sertraline in the past, recently started on Cymbalta in 10/2019 in the  hospital. -She is no longer on Cymbalta as she felt more down and depressed on it. I will call in Celexa to help her depression (11/05/19). I discussed the effects will take at least a month to take effect.   7. Leg weakness, Falls  -She has been having more leg weakness and lower back weakness. She has been falling more lately from this weakness.  -I discussed intermittent steroid use can effect her muscles especially given her obesity.  -I recommend PT to help strengthen her legs and back. She is agreeable. I will refer her after she meets with Financial office  -I will also refer her to Dr Barbaraann Cao in the meantime.    PLAN: -Refer back to Dr Barbaraann Cao for leg weakness and balance issue  -F/u with Blake Divine about her grant and financial assistance from [facility]  -I refilled Coumadin today  -Lab  reviewed, plt 235K and INR 3.5. Will hold Coumadin today then restart tomorrow at reduce dose 5mg  daily except MF take 7.5mg .  -Proceed with week 4 Rituxan today, will hold after this treatment  -Lab in 1, 2 and 4 weeks  -F/u in 4 weeks   No problem-specific Assessment & Plan notes found for this encounter.   No orders of the defined types were placed in this encounter.  All questions were answered. The patient knows to call the clinic with any problems, questions or concerns. No barriers to learning was detected. The total time spent in the appointment was 30 minutes.     , MD 11/06/2019   I, 11/08/2019, am acting as scribe for Delphina Cahill, MD.   I have reviewed the above documentation for accuracy and completeness, and I agree with the above.

## 2019-11-06 ENCOUNTER — Encounter: Payer: Self-pay | Admitting: Hematology

## 2019-11-06 ENCOUNTER — Inpatient Hospital Stay: Payer: Self-pay

## 2019-11-06 ENCOUNTER — Other Ambulatory Visit: Payer: Self-pay

## 2019-11-06 ENCOUNTER — Inpatient Hospital Stay (HOSPITAL_BASED_OUTPATIENT_CLINIC_OR_DEPARTMENT_OTHER): Payer: Self-pay | Admitting: Hematology

## 2019-11-06 ENCOUNTER — Encounter: Payer: Self-pay | Admitting: Genetic Counselor

## 2019-11-06 VITALS — BP 100/46 | HR 96 | Temp 99.1°F | Resp 17 | Ht <= 58 in | Wt 242.1 lb

## 2019-11-06 VITALS — BP 97/61 | HR 87 | Temp 98.6°F | Resp 18

## 2019-11-06 DIAGNOSIS — I82402 Acute embolism and thrombosis of unspecified deep veins of left lower extremity: Secondary | ICD-10-CM

## 2019-11-06 DIAGNOSIS — D693 Immune thrombocytopenic purpura: Secondary | ICD-10-CM

## 2019-11-06 DIAGNOSIS — Z5181 Encounter for therapeutic drug level monitoring: Secondary | ICD-10-CM

## 2019-11-06 DIAGNOSIS — Z7901 Long term (current) use of anticoagulants: Secondary | ICD-10-CM

## 2019-11-06 DIAGNOSIS — Z23 Encounter for immunization: Secondary | ICD-10-CM

## 2019-11-06 LAB — CMP (CANCER CENTER ONLY)
ALT: 15 U/L (ref 0–44)
AST: 13 U/L — ABNORMAL LOW (ref 15–41)
Albumin: 3.1 g/dL — ABNORMAL LOW (ref 3.5–5.0)
Alkaline Phosphatase: 74 U/L (ref 38–126)
Anion gap: 9 (ref 5–15)
BUN: 14 mg/dL (ref 6–20)
CO2: 22 mmol/L (ref 22–32)
Calcium: 9.7 mg/dL (ref 8.9–10.3)
Chloride: 106 mmol/L (ref 98–111)
Creatinine: 0.69 mg/dL (ref 0.44–1.00)
GFR, Est AFR Am: >60 mL/min (ref >60)
GFR, Estimated: >60 mL/min (ref >60)
Glucose, Bld: 121 mg/dL — ABNORMAL HIGH (ref 70–99)
Potassium: 3.7 mmol/L (ref 3.5–5.1)
Sodium: 137 mmol/L (ref 135–145)
Total Bilirubin: <0.2 mg/dL — ABNORMAL LOW (ref 0.3–1.2)
Total Protein: 8 g/dL (ref 6.5–8.1)

## 2019-11-06 LAB — CBC WITH DIFFERENTIAL (CANCER CENTER ONLY)
Abs Immature Granulocytes: 0.08 10*3/uL — ABNORMAL HIGH (ref 0.00–0.07)
Basophils Absolute: 0 10*3/uL (ref 0.0–0.1)
Basophils Relative: 0 %
Eosinophils Absolute: 0.1 10*3/uL (ref 0.0–0.5)
Eosinophils Relative: 1 %
HCT: 36.5 % (ref 36.0–46.0)
Hemoglobin: 11.7 g/dL — ABNORMAL LOW (ref 12.0–15.0)
Immature Granulocytes: 1 %
Lymphocytes Relative: 20 %
Lymphs Abs: 1.9 10*3/uL (ref 0.7–4.0)
MCH: 28.6 pg (ref 26.0–34.0)
MCHC: 32.1 g/dL (ref 30.0–36.0)
MCV: 89.2 fL (ref 80.0–100.0)
Monocytes Absolute: 0.6 10*3/uL (ref 0.1–1.0)
Monocytes Relative: 7 %
Neutro Abs: 6.7 10*3/uL (ref 1.7–7.7)
Neutrophils Relative %: 71 %
Platelet Count: 253 10*3/uL (ref 150–400)
RBC: 4.09 MIL/uL (ref 3.87–5.11)
RDW: 15.7 % — ABNORMAL HIGH (ref 11.5–15.5)
WBC Count: 9.4 10*3/uL (ref 4.0–10.5)
nRBC: 0.4 % — ABNORMAL HIGH (ref 0.0–0.2)

## 2019-11-06 LAB — PROTIME-INR
INR: 3.5 — ABNORMAL HIGH (ref 0.8–1.2)
Prothrombin Time: 34.3 seconds — ABNORMAL HIGH (ref 11.4–15.2)

## 2019-11-06 MED ORDER — SODIUM CHLORIDE 0.9 % IV SOLN
Freq: Once | INTRAVENOUS | Status: AC
  Filled 2019-11-06: qty 250

## 2019-11-06 MED ORDER — ACETAMINOPHEN 325 MG PO TABS
650.0000 mg | ORAL_TABLET | Freq: Once | ORAL | Status: AC
  Administered 2019-11-06: 650 mg via ORAL

## 2019-11-06 MED ORDER — SODIUM CHLORIDE 0.9 % IV SOLN
20.0000 mg | Freq: Once | INTRAVENOUS | Status: AC
  Administered 2019-11-06: 20 mg via INTRAVENOUS
  Filled 2019-11-06: qty 20

## 2019-11-06 MED ORDER — FAMOTIDINE IN NACL 20-0.9 MG/50ML-% IV SOLN
20.0000 mg | Freq: Once | INTRAVENOUS | Status: AC
  Administered 2019-11-06: 20 mg via INTRAVENOUS

## 2019-11-06 MED ORDER — DIPHENHYDRAMINE HCL 25 MG PO CAPS
50.0000 mg | ORAL_CAPSULE | Freq: Once | ORAL | Status: AC
  Administered 2019-11-06: 50 mg via ORAL

## 2019-11-06 MED ORDER — SODIUM CHLORIDE 0.9 % IV SOLN
375.0000 mg/m2 | Freq: Once | INTRAVENOUS | Status: AC
  Administered 2019-11-06: 800 mg via INTRAVENOUS
  Filled 2019-11-06: qty 50

## 2019-11-06 MED ORDER — FAMOTIDINE IN NACL 20-0.9 MG/50ML-% IV SOLN
INTRAVENOUS | Status: AC
  Filled 2019-11-06: qty 50

## 2019-11-06 MED ORDER — WARFARIN SODIUM 5 MG PO TABS: ORAL_TABLET | ORAL | 0 refills | Status: DC

## 2019-11-06 MED ORDER — DIPHENHYDRAMINE HCL 25 MG PO CAPS
ORAL_CAPSULE | ORAL | Status: AC
  Filled 2019-11-06: qty 2

## 2019-11-06 MED ORDER — ACETAMINOPHEN 325 MG PO TABS
ORAL_TABLET | ORAL | Status: AC
  Filled 2019-11-06: qty 2

## 2019-11-06 NOTE — Patient Instructions (Signed)
Rituximab; Hyaluronidase injection What is this medicine? RITUXIMAB; HYALURONIDASE (ri TUX i mab / hye al ur ON i dase) is used to treat non-Hodgkin lymphoma and chronic lymphocytic leukemia. Rituximab is a monoclonal antibody. Hyaluronidase is used to improve the effects of rituximab. This medicine may be used for other purposes; ask your health care provider or pharmacist if you have questions. COMMON BRAND NAME(S): Rituxan Hycela What should I tell my health care provider before I take this medicine? They need to know if you have any of these conditions:  heart disease  infection (especially a virus infection such as hepatitis B, chickenpox, cold sores, or herpes)  immune system problems  irregular heartbeat  kidney disease  lung or breathing disease, like asthma  recently received or scheduled to receive a vaccine  an unusual or allergic reaction to rituximab, rituximab/hyaluronidase, mouse proteins, other medicines, foods, dyes, or preservatives  pregnant or trying to get pregnant  breast-feeding How should I use this medicine? This medicine is for injection under the skin. It is given by a health care professional in a hospital or clinic setting. A special MedGuide will be given to you before each treatment. Be sure to read this information carefully each time. Talk to your pediatrician regarding the use of this medicine in children. Special care may be needed. Overdosage: If you think you have taken too much of this medicine contact a poison control center or emergency room at once. NOTE: This medicine is only for you. Do not share this medicine with others. What if I miss a dose? It is important not to miss your dose. Call your doctor or health care professional if you are unable to keep an appointment. What may interact with this medicine? This medicine may interact with the following medications:  cisplatin  live virus vaccines This list may not describe all  possible interactions. Give your health care provider a list of all the medicines, herbs, non-prescription drugs, or dietary supplements you use. Also tell them if you smoke, drink alcohol, or use illegal drugs. Some items may interact with your medicine. What should I watch for while using this medicine? Your condition will be monitored carefully while you are receiving this medicine. You may need blood work done while you are taking this medicine. This medicine can cause serious allergic reactions. To reduce your risk you may need to take medicine before treatment with this medicine. Take your medicine as directed. In some patients, this medicine may cause a serious brain infection that may cause death. If you have any problems seeing, thinking, speaking, walking, or standing, tell your doctor right away. If you cannot reach your doctor, urgently seek other source of medical care. Call your doctor or health care professional for advice if you get a fever, chills or sore throat, or other symptoms of a cold or flu. Do not treat yourself. This drug decreases your body's ability to fight infections. Try to avoid being around people who are sick. Do not become pregnant while taking this medicine or for 12 months after stopping it. Women should inform their doctor if they wish to become pregnant or think they might be pregnant. There is a potential for serious side effects to an unborn child. Talk to your health care professional or pharmacist for more information. Do not breast-feed an infant while taking this medicine or for at least 6 months after stopping it. What side effects may I notice from receiving this medicine? Side effects that you should report   to your doctor or health care professional as soon as possible:  allergic reactions like skin rash, itching or hives; swelling of the face, lips, or tongue  breathing problems  chest pain  changes in vision  diarrhea  dizziness  headache with  fever, neck stiffness, sensitivity to light, nausea, or confusion  fast, irregular heartbeat  loss of memory  low blood counts - this medicine may decrease the number of white blood cells, red blood cells and platelets. You may be at increased risk for infections and bleeding.  mouth sores  problems with balance, talking, or walking  redness, blistering, peeling or loosening of the skin, including inside the mouth  signs of infection - fever or chills, cough, sore throat, pain or difficulty passing urine  signs and symptoms of kidney injury like trouble passing urine or change in the amount of urine  signs and symptoms of liver injury like dark yellow or brown urine; general ill feeling or flu-like symptoms; light-colored stools; loss of appetite; nausea; right upper belly pain; unusually weak or tired; yellowing of the eyes or skin  stomach pain  swelling of the ankles, feet, hands  unusual bleeding or bruising  vomiting Side effects that usually do not require medical attention (report these to your doctor or health care professional if they continue or are bothersome):  headache  joint pain  muscle cramps or muscle pain  nausea  pain, redness, or irritation at site where injected  tiredness This list may not describe all possible side effects. Call your doctor for medical advice about side effects. You may report side effects to FDA at 1-800-FDA-1088. Where should I keep my medicine? This drug is given in a hospital or clinic and will not be stored at home. NOTE: This sheet is a summary. It may not cover all possible information. If you have questions about this medicine, talk to your doctor, pharmacist, or health care provider.  2020 Elsevier/Gold Standard (2017-02-16 13:12:25)  

## 2019-11-06 NOTE — Progress Notes (Signed)
Patient was observed for 15 minutes post Pfizer COVID Vaccine shot and she tolerated this well. She knows to call the clinic with any complain or concerns.

## 2019-11-10 MED FILL — WARFARIN SODIUM 5 MG TABLET: 5 | 30 days supply | Qty: 30 | Fill #0

## 2019-11-11 ENCOUNTER — Other Ambulatory Visit: Payer: Self-pay

## 2019-11-11 ENCOUNTER — Ambulatory Visit (INDEPENDENT_AMBULATORY_CARE_PROVIDER_SITE_OTHER): Payer: Self-pay | Admitting: Physician Assistant

## 2019-11-11 ENCOUNTER — Ambulatory Visit (HOSPITAL_COMMUNITY)
Admission: RE | Admit: 2019-11-11 | Discharge: 2019-11-11 | Disposition: A | Discharge status: Home / Self Care | Payer: Self-pay | Source: Ambulatory Visit | Attending: Vascular Surgery | Admitting: Vascular Surgery

## 2019-11-11 VITALS — BP 107/72 | HR 77 | Temp 98.5°F | Resp 20 | Ht <= 58 in | Wt 241.9 lb

## 2019-11-11 DIAGNOSIS — D693 Immune thrombocytopenic purpura: Secondary | ICD-10-CM | POA: Insufficient documentation

## 2019-11-11 DIAGNOSIS — I82422 Acute embolism and thrombosis of left iliac vein: Secondary | ICD-10-CM

## 2019-11-11 NOTE — Progress Notes (Signed)
Office Note     CC:  follow up Requesting Provider:  Malachy Mood, MD  HPI: [name] is a 44 y.o. (1975-11-14) female who presents for follow-up after thrombectomy of extensive iliofemoral DVT and left CIV stent placement one month ago. Medical history is significant for chronic ITP.  She was discharged home on enoxaparin bridge to warfarin.  She is being followed at [facility] heme/onc since discharge for protime monitoring and treatment for ITP.  Her daughter who is fluent in [place], accompanies her today.  She has intermittent complaints of skin numbness of the left lower quadrant and right lateral thigh.  She denies lower extremity pain or swelling.  Denies shortness of breath or chest pain.  She is compliant with her Coumadin but states she does have some gum bleeding after she takes her 7.5 mg dose.  10/08/2019: After popliteal access,IVUS confirmed mostly acute with some underlying chronic thrombus in the left popliteal, superficial femoral vein, common femoral vein, external iliac vein and common iliac vein with a small tongue of thrombus into the IVC. 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 Percutaneous mechanical thrombectomy was performed within [facility] Clot[facility]everwith 7 passesof the left popliteal, superficial femoral, common femoral, external iliac, and left common iliac vein and IVCand retrieved large volumes of mostly acute and some chronic appearing thrombus. The left common iliac vein stenosis was then predilated with a 78mm Atlas,stented with a 16 mm Wallstent,and postdilated with a 14 mm Atlas with good wall apposition no residual stenosis.  Past Medical History:  Diagnosis Date  . Anemia   . Chronic ITP (idiopathic thrombocytopenia) (HCC)   . Depression   . Headache   . Retinal hemorrhage of both eyes 10/05/2015   Due to  severe thrombocytopenia from ITP    Past Surgical History:  Procedure Laterality Date  . BREAST SURGERY  biopsy  . LOWER EXTREMITY VENOGRAPHY Left 10/08/2019   Procedure: LOWER EXTREMITY VENOGRAPHY;  Surgeon: Cephus Shelling, MD;  [facility] INVAS[facility] CV LAB;  Service: Cardiovascular;  Laterality: Left;  . PERIPHERAL VASCULAR INTERVENTION Left 10/08/2019   Procedure: PERIPHERAL VASCULAR INTERVENTION;  Surgeon: Cephus Shelling, MD;  Location: [facility] INVASIVE CV LAB;  Service: Cardiovascular;  Laterality: Left;  LT COMMON ILIAC VEIN  . PERIPHERAL VASCULAR THROMBECTOMY N/A 10/08/2019   Procedure: PERIPHERAL VASCULAR THROMBECTOMY;  Surgeon: Cephus Shelling, MD;  Location: [facility] INVASIVE CV LAB;  Service: Cardiovascular;  Laterality: N/A;  . SPLENECTOMY, TOTAL N/A 09/16/2015   Procedure: OPEN SPLENECTOMY;  Surgeon: Chevis Pretty III, MD;  Location: [facility] OR;  Service: General;  Laterality: N/A;    Social History   Socioeconomic History  . Marital status: Married    Spouse name: Not on file  . Number of children: Not on file  . Years of education: Not on file  . Highest education level: Not on file  Occupational History  . Occupation: packs personal products  Tobacco Use  . Smoking status: Never Smoker  . Smokeless tobacco: Never Used  Vaping Use  . Vaping Use: Never used  Substance and Sexual Activity  . Alcohol use: No    Alcohol/week: 0.0 standard drinks  . Drug use: No  . Sexual activity: Not on file  Other Topics Concern  . Not on file  Social History Narrative  . Not on file   Social Determinants of Health   Financial Resource Strain:   .  Difficulty of Paying Living Expenses: Not on file  Food Insecurity:   . Worried About Programme researcher, broadcasting/film/video in the Last Year: Not on file  . Ran Out of Food in the Last Year: Not on file  Transportation Needs:   . Lack of Transportation (Medical): Not on file  . Lack of Transportation (Non-Medical): Not on file  Physical Activity:   .  Days of Exercise per Week: Not on file  . Minutes of Exercise per Session: Not on file  Stress:   . Feeling of Stress : Not on file  Social Connections:   . Frequency of Communication with Friends and Family: Not on file  . Frequency of Social Gatherings with Friends and Family: Not on file  . Attends Religious Services: Not on file  . Active Member of Clubs or Organizations: Not on file  . Attends Banker Meetings: Not on file  . Marital Status: Not on file  Intimate Partner Violence:   . Fear of Current or Ex-Partner: Not on file  . Emotionally Abused: Not on file  . Physically Abused: Not on file  . Sexually Abused: Not on file   Family History  Problem Relation Age of Onset  . Diabetes Mother   . Breast cancer Mother     Current Outpatient Medications  Medication Sig Dispense Refill  . pantoprazole (PROTONIX) 40 MG tablet Take 1 tablet (40 mg total) by mouth daily. 30 tablet 0  . warfarin (COUMADIN) 5 MG tablet 7.5mg  daily on Mondays and Fridays, and 5mg  daily for the rest of week 32 tablet 0   No current facility-administered medications for this visit.    No Known Allergies   REVIEW OF SYSTEMS:   [X]  denotes positive finding, [ ]  denotes negative finding Cardiac  Comments:  Chest pain or chest pressure:    Shortness of breath upon exertion:    Short of breath when lying flat:    Irregular heart rhythm:        Vascular    Pain in calf, thigh, or hip brought on by ambulation:    Pain in feet at night that wakes you up from your sleep:     Blood clot in your veins:    Leg swelling:         Pulmonary    Oxygen at home:    Productive cough:     Wheezing:         Neurologic    Sudden weakness in arms or legs:     Sudden numbness in arms or legs:     Sudden onset of difficulty speaking or slurred speech:    Temporary loss of vision in one eye:     Problems with dizziness:         Gastrointestinal    Blood in stool:     Vomited blood:           Genitourinary    Burning when urinating:     Blood in urine:        Psychiatric    Major depression:         Hematologic    Bleeding problems:    Problems with blood clotting too easily:        Skin    Rashes or ulcers:        Constitutional    Fever or chills:      PHYSICAL EXAMINATION:  Vitals:   11/11/19 0858  BP: 107/72  Pulse: 77  Resp:  20  Temp: 98.5 F (36.9 C)  SpO2: 97%   General:  WDWN in NAD; vital signs documented above Gait: Unaided, no ataxia HENT: WNL, normocephalic Pulmonary: normal non-labored breathing , without Rales, rhonchi,  wheezing Cardiac: regular HR, without  Murmurs without carotid bruit Abdomen: soft, NT, no masses Skin: without rashes Vascular Exam/Pulses: 2+ bilateral femoral dorsalis pedis and posterior tibial pulses. Extremities: without ischemic changes, without Gangrene , without cellulitis; without open wounds; remedy edema.  Calves are soft bilaterally Musculoskeletal: no muscle wasting or atrophy  Neurologic: A&O X 3;  No focal weakness or paresthesias are detected Psychiatric:  The pt has Normal affect.   Non-Invasive Vascular Imaging:   11/11/2019 IVC/Iliac: Patent IVC where visualized. Left CIV appears patent. Left EIV  patent. Study limited by body habitus and abcominal gas.    ASSESSMENT/PLAN:: 44 y.o. female here for follow up for thrombectomy for extensive iliofemoral DVT and placement of left common iliac vein stent.  No evidence of recurrence.  Visualized portions of the IVC and left iliac veins revealed patency.  I discussed with the patient and her daughter to call our office for lower extremity edema or pain.  I instructed them to proceed to the emergency department if the symptoms are accompanied by shortness of breath, chest pain or fever.  Continue Coumadin therapy as instructed by her hematologist and follow-up as arranged.  We will follow-up in 6 months with repeat IVC/iliac vein duplex.  Milinda Antis,  PA-C [facility] [phone]  Clinic MD:   Chestine Spore

## 2019-11-13 ENCOUNTER — Inpatient Hospital Stay: Payer: Self-pay

## 2019-11-13 ENCOUNTER — Telehealth: Payer: Self-pay

## 2019-11-13 ENCOUNTER — Encounter: Payer: Self-pay | Admitting: Internal Medicine

## 2019-11-13 ENCOUNTER — Other Ambulatory Visit: Payer: Self-pay | Admitting: *Deleted

## 2019-11-13 ENCOUNTER — Other Ambulatory Visit: Payer: Self-pay

## 2019-11-13 ENCOUNTER — Inpatient Hospital Stay (HOSPITAL_BASED_OUTPATIENT_CLINIC_OR_DEPARTMENT_OTHER): Payer: Self-pay | Admitting: Internal Medicine

## 2019-11-13 VITALS — BP 97/57 | HR 74 | Temp 97.7°F | Resp 18 | Ht <= 58 in | Wt 243.2 lb

## 2019-11-13 DIAGNOSIS — G8929 Other chronic pain: Secondary | ICD-10-CM

## 2019-11-13 DIAGNOSIS — I82402 Acute embolism and thrombosis of unspecified deep veins of left lower extremity: Secondary | ICD-10-CM

## 2019-11-13 DIAGNOSIS — R519 Headache, unspecified: Secondary | ICD-10-CM

## 2019-11-13 DIAGNOSIS — I82422 Acute embolism and thrombosis of left iliac vein: Secondary | ICD-10-CM

## 2019-11-13 DIAGNOSIS — D693 Immune thrombocytopenic purpura: Secondary | ICD-10-CM

## 2019-11-13 DIAGNOSIS — M5416 Radiculopathy, lumbar region: Secondary | ICD-10-CM

## 2019-11-13 DIAGNOSIS — Z5181 Encounter for therapeutic drug level monitoring: Secondary | ICD-10-CM

## 2019-11-13 HISTORY — DX: Radiculopathy, lumbar region: M54.16

## 2019-11-13 LAB — CMP (CANCER CENTER ONLY)
ALT: 12 U/L (ref 0–44)
AST: 14 U/L — ABNORMAL LOW (ref 15–41)
Albumin: 3.3 g/dL — ABNORMAL LOW (ref 3.5–5.0)
Alkaline Phosphatase: 80 U/L (ref 38–126)
Anion gap: 15 (ref 5–15)
BUN: 11 mg/dL (ref 6–20)
CO2: 16 mmol/L — ABNORMAL LOW (ref 22–32)
Calcium: 9.6 mg/dL (ref 8.9–10.3)
Chloride: 104 mmol/L (ref 98–111)
Creatinine: 0.72 mg/dL (ref 0.44–1.00)
GFR, Est AFR Am: >60 mL/min (ref >60)
GFR, Estimated: >60 mL/min (ref >60)
Glucose, Bld: 127 mg/dL — ABNORMAL HIGH (ref 70–99)
Potassium: 4.1 mmol/L (ref 3.5–5.1)
Sodium: 135 mmol/L (ref 135–145)
Total Bilirubin: <0.2 mg/dL — ABNORMAL LOW (ref 0.3–1.2)
Total Protein: 8.3 g/dL — ABNORMAL HIGH (ref 6.5–8.1)

## 2019-11-13 LAB — PROTIME-INR
INR: 2.4 — ABNORMAL HIGH (ref 0.8–1.2)
Prothrombin Time: 25.3 seconds — ABNORMAL HIGH (ref 11.4–15.2)

## 2019-11-13 LAB — CBC WITH DIFFERENTIAL (CANCER CENTER ONLY)
Abs Immature Granulocytes: 0.11 10*3/uL — ABNORMAL HIGH (ref 0.00–0.07)
Basophils Absolute: 0 10*3/uL (ref 0.0–0.1)
Basophils Relative: 0 %
Eosinophils Absolute: 0.2 10*3/uL (ref 0.0–0.5)
Eosinophils Relative: 2 %
HCT: 37.3 % (ref 36.0–46.0)
Hemoglobin: 12.4 g/dL (ref 12.0–15.0)
Immature Granulocytes: 1 %
Lymphocytes Relative: 21 %
Lymphs Abs: 2 10*3/uL (ref 0.7–4.0)
MCH: 29.7 pg (ref 26.0–34.0)
MCHC: 33.2 g/dL (ref 30.0–36.0)
MCV: 89.2 fL (ref 80.0–100.0)
Monocytes Absolute: 0.6 10*3/uL (ref 0.1–1.0)
Monocytes Relative: 6 %
Neutro Abs: 6.6 10*3/uL (ref 1.7–7.7)
Neutrophils Relative %: 70 %
Platelet Count: 376 10*3/uL (ref 150–400)
RBC: 4.18 MIL/uL (ref 3.87–5.11)
RDW: 15.5 % (ref 11.5–15.5)
WBC Count: 9.5 10*3/uL (ref 4.0–10.5)
nRBC: 0.4 % — ABNORMAL HIGH (ref 0.0–0.2)

## 2019-11-13 MED ORDER — GABAPENTIN 300 MG PO CAPS: 300.0000 mg | ORAL_CAPSULE | Freq: Two times a day (BID) | ORAL | 3 refills | Status: DC

## 2019-11-13 NOTE — Progress Notes (Signed)
[facility] [address]  [place], [place] [address] [phone]   Interval Evaluation  Date of Service: 11/13/19 Patient Name: Denaya Holland Patient MRN: [id number] Patient DOB: [birth year] Provider: Henreitta Leber, MD  Identifying Statement:  [name] is a 44 y.o. female with headaches, gait impairment   Primary Cancer: ITP (heme, non-neoplastic)  Interval History:  Harshita Bernales presents today for follow up after development of new symptoms.  She describes numbness and pain along the lateral aspect of her right leg, radiating down from her lower back.  Numbness is describes as "pins and needles".  Symptoms began immediately following surgery for removal and stenting of large left leg DVT on 10/08/19.  The numbness and discomfort is not accompanied by frank weakness, although her gait is very limited; she is relying on assistance in the home for ambulation at this time.  Overall burden of symptoms has not improved over the past month.  Back pain is still present as well.  Fortunately, headaches are somewhat improved from prior.  Currently on coumadin.  H+P (08/28/19) Patient presents to discuss long standing history of headaches, as well as visual and gait impairments.  She describes chronic, daily pain in the back of her head, radiating up from neck.  No spread to frontal or temporal regions.  No associated nausea, vomiting, photophobia, or other migrainous features.  Symptoms have been occurring at least 4-5 years, if not longer.  Tylenol had helped previously but no longer very effective.  She also describes visual floaters which come and go; she was evaluated in depth by ophthalmology without any etiology uncovered.  Also complains of feeling "dizzy" for several years when walking, "feels like I could fall backwards".  She has never fallen, and does not need assistance to ambulate.  Continues to receive Nplate and  decadron for chronic ITP through Dr. Mosetta Putt.  Last had CT head 6 months prior which was normal.       Medications: Current Outpatient Medications on File Prior to Visit  Medication Sig Dispense Refill  . pantoprazole (PROTONIX) 40 MG tablet Take 1 tablet (40 mg total) by mouth daily. 30 tablet 0  . warfarin (COUMADIN) 5 MG tablet 7.5mg  daily on Mondays and Fridays, and 5mg  daily for the rest of week 32 tablet 0   No current facility-administered medications on file prior to visit.    Allergies: No Known Allergies Past Medical History:  Past Medical History:  Diagnosis Date  . Anemia   . Chronic ITP (idiopathic thrombocytopenia) (HCC)   . Depression   . Headache   . 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   Past Surgical History:  Past Surgical History:  Procedure Laterality Date  . BREAST SURGERY  biopsy  . LOWER EXTREMITY VENOGRAPHY Left 10/08/2019   Procedure: LOWER EXTREMITY VENOGRAPHY;  Surgeon: 10/10/2019, MD;  Location: [facility] INVASIVE CV LAB;  Service: Cardiovascular;  Laterality: Left;  . PERIPHERAL VASCULAR INTERVENTION Left 10/08/2019   Procedure: PERIPHERAL VASCULAR INTERVENTION;  Surgeon: 10/10/2019, MD;  Location: [facility] INVASIVE CV LAB;  Service: Cardiovascular;  Laterality: Left;  LT COMMON ILIAC VEIN  . PERIPHERAL VASCULAR THROMBECTOMY N/A 10/08/2019   Procedure: PERIPHERAL VASCULAR THROMBECTOMY;  Surgeon: 10/10/2019, MD;  Location: [facility] INVASIVE CV LAB;  Service: Cardiovascular;  Laterality: N/A;  . SPLENECTOMY, TOTAL N/A 09/16/2015   Procedure: OPEN SPLENECTOMY;  Surgeon: 09/18/2015 III, MD;  Location:  [facility] OR;  Service: General;  Laterality: N/A;   Social History:  Social History   Socioeconomic History  . Marital status: Married    Spouse name: Not on file  . Number of children: Not on file  . Years of education: Not on file  . Highest education level: Not on file  Occupational History  .  Occupation: [employer] personal [employer]  Tobacco Use  . Smoking status: Never Smoker  . Smokeless tobacco: Never Used  Vaping Use  . Vaping Use: Never used  Substance and Sexual Activity  . Alcohol use: No    Alcohol/week: 0.0 standard drinks  . Drug use: No  . Sexual activity: Not on file  Other Topics Concern  . Not on file  Social History Narrative  . Not on file   Social Determinants of Health   Financial Resource Strain:   . Difficulty of Paying Living Expenses: Not on file  Food Insecurity:   . Worried About Programme researcher, broadcasting/film/video in the Last Year: Not on file  . Ran Out of Food in the Last Year: Not on file  Transportation Needs:   . Lack of Transportation (Medical): Not on file  . Lack of Transportation (Non-Medical): Not on file  Physical Activity:   . Days of Exercise per Week: Not on file  . Minutes of Exercise per Session: Not on file  Stress:   . Feeling of Stress : Not on file  Social Connections:   . Frequency of Communication with Friends and Family: Not on file  . Frequency of Social Gatherings with Friends and Family: Not on file  . Attends Religious Services: Not on file  . Active Member of Clubs or Organizations: Not on file  . Attends Banker Meetings: Not on file  . Marital Status: Not on file  Intimate Partner Violence:   . Fear of Current or Ex-Partner: Not on file  . Emotionally Abused: Not on file  . Physically Abused: Not on file  . Sexually Abused: Not on file   Family History:  Family History  Problem Relation Age of Onset  . Diabetes Mother   . Breast cancer Mother     Review of Systems: Constitutional: Doesn't report fevers, chills or abnormal weight loss Eyes: Doesn't report blurriness of vision Ears, nose, mouth, throat, and face: Doesn't report sore throat Respiratory: Doesn't report cough, dyspnea or wheezes Cardiovascular: Doesn't report palpitation, chest discomfort  Gastrointestinal:  Doesn't report nausea,  constipation, diarrhea GU: Doesn't report incontinence Skin: Doesn't report skin rashes Neurological: Per HPI Musculoskeletal: Doesn't report joint pain Behavioral/Psych: Doesn't report anxiety  Physical Exam: Vitals:   11/13/19 0857  BP: (!) 97/57  Pulse: 74  Resp: 18  Temp: 97.7 F (36.5 C)  SpO2: 99%   KPS: 90. General: Alert, cooperative, pleasant, in no acute distress Head: Normal EENT: No conjunctival injection or scleral icterus.  Lungs: Resp effort normal Cardiac: Regular rate Abdomen: Non-distended abdomen Skin: No rashes cyanosis or petechiae. Extremities: No clubbing or edema  Neurologic Exam: Mental Status: Awake, alert, attentive to examiner. Oriented to self and environment. Language is fluent with intact comprehension.  Cranial Nerves: Visual acuity is grossly normal. Visual fields are full. Extra-ocular movements intact. No ptosis. Face is symmetric Motor: Tone and bulk are normal. Power is full in both arms and legs. Reflexes are symmetric, no pathologic reflexes present.  Sensory: Impaired light touch right leg in L5/S1 distribution Gait: Sensory dystaxia  Labs: I have reviewed the data  as listed    Component Value Date/Time   NA 135 11/13/2019 0818   K 4.1 11/13/2019 0818   CL 104 11/13/2019 0818   CO2 16 (L) 11/13/2019 0818   GLUCOSE 127 (H) 11/13/2019 0818   BUN 11 11/13/2019 0818   CREATININE 0.72 11/13/2019 0818   CALCIUM 9.6 11/13/2019 0818   PROT 8.3 (H) 11/13/2019 0818   ALBUMIN 3.3 (L) 11/13/2019 0818   AST 14 (L) 11/13/2019 0818   ALT 12 11/13/2019 0818   ALKPHOS 80 11/13/2019 0818   BILITOT <0.2 (L) 11/13/2019 0818   GFRNONAA >60 11/13/2019 0818   GFRAA >60 11/13/2019 0818   Lab Results  Component Value Date   WBC 9.5 11/13/2019   NEUTROABS 6.6 11/13/2019   HGB 12.4 11/13/2019   HCT 37.3 11/13/2019   MCV 89.2 11/13/2019   PLT 376 11/13/2019     Assessment/Plan Occipital Neuralgia  Marisa Hage presents  clinical syndrome of lumbar radiculopathy, localizing primarily to right L5/S1 nerve root.  No motor symptoms but sensory and pain symptoms are quite prominent and are affecting her gait/ambulation.  We recommended the following: -MRI Lumbar Spine w/o contrast -Trial of Gabapentin 300mg  BID -Referral to physical therapy  Recommended continuing to follow up with ophthalmology for prior visual complaints.   We will follow up with her after testing and initial rehab sessions, in 1 month or sooner as needed.  We appreciate the opportunity to participate in the care of Faria Casella.   All questions were answered. The patient knows to call the clinic with any problems, questions or concerns. No barriers to learning were detected.  The total time spent in the encounter was 40 minutes and more than 50% was on counseling and review of test results   [name], MD Medical Director of Neuro-Oncology [facility] 11/13/19 9:02 AM

## 2019-11-13 NOTE — Telephone Encounter (Signed)
[name]'s daughter [name] called stating that [facility] doesn't have the prescription that was sent by the doctor that saw her today. She saw Dr. Barbaraann Cao today message forward to Elmyra Ricks, RN

## 2019-11-14 ENCOUNTER — Telehealth: Payer: Self-pay | Admitting: Internal Medicine

## 2019-11-14 NOTE — Telephone Encounter (Signed)
Scheduled per 8/26 los. Used interpreter line and unable to reach pt. Left voicemail with appt time and date.

## 2019-11-18 ENCOUNTER — Other Ambulatory Visit: Payer: Self-pay | Admitting: Radiation Therapy

## 2019-11-19 ENCOUNTER — Encounter (HOSPITAL_COMMUNITY): Payer: Self-pay

## 2019-11-20 ENCOUNTER — Other Ambulatory Visit: Payer: Self-pay

## 2019-11-20 ENCOUNTER — Inpatient Hospital Stay: Payer: Self-pay | Attending: Nurse Practitioner

## 2019-11-20 DIAGNOSIS — Z7901 Long term (current) use of anticoagulants: Secondary | ICD-10-CM | POA: Insufficient documentation

## 2019-11-20 DIAGNOSIS — M542 Cervicalgia: Secondary | ICD-10-CM | POA: Insufficient documentation

## 2019-11-20 DIAGNOSIS — R269 Unspecified abnormalities of gait and mobility: Secondary | ICD-10-CM | POA: Insufficient documentation

## 2019-11-20 DIAGNOSIS — Z23 Encounter for immunization: Secondary | ICD-10-CM | POA: Insufficient documentation

## 2019-11-20 DIAGNOSIS — Z79899 Other long term (current) drug therapy: Secondary | ICD-10-CM | POA: Insufficient documentation

## 2019-11-20 DIAGNOSIS — Z86711 Personal history of pulmonary embolism: Secondary | ICD-10-CM | POA: Insufficient documentation

## 2019-11-20 DIAGNOSIS — Z8616 Personal history of COVID-19: Secondary | ICD-10-CM | POA: Insufficient documentation

## 2019-11-20 DIAGNOSIS — D693 Immune thrombocytopenic purpura: Secondary | ICD-10-CM | POA: Insufficient documentation

## 2019-11-20 DIAGNOSIS — I82402 Acute embolism and thrombosis of unspecified deep veins of left lower extremity: Secondary | ICD-10-CM

## 2019-11-20 DIAGNOSIS — R296 Repeated falls: Secondary | ICD-10-CM | POA: Insufficient documentation

## 2019-11-20 DIAGNOSIS — Z803 Family history of malignant neoplasm of breast: Secondary | ICD-10-CM | POA: Insufficient documentation

## 2019-11-20 DIAGNOSIS — R531 Weakness: Secondary | ICD-10-CM | POA: Insufficient documentation

## 2019-11-20 DIAGNOSIS — R519 Headache, unspecified: Secondary | ICD-10-CM | POA: Insufficient documentation

## 2019-11-20 DIAGNOSIS — Z5181 Encounter for therapeutic drug level monitoring: Secondary | ICD-10-CM

## 2019-11-20 DIAGNOSIS — M5481 Occipital neuralgia: Secondary | ICD-10-CM | POA: Insufficient documentation

## 2019-11-20 DIAGNOSIS — F329 Major depressive disorder, single episode, unspecified: Secondary | ICD-10-CM | POA: Insufficient documentation

## 2019-11-20 DIAGNOSIS — R42 Dizziness and giddiness: Secondary | ICD-10-CM | POA: Insufficient documentation

## 2019-11-20 DIAGNOSIS — Z9081 Acquired absence of spleen: Secondary | ICD-10-CM | POA: Insufficient documentation

## 2019-11-20 DIAGNOSIS — Z86718 Personal history of other venous thrombosis and embolism: Secondary | ICD-10-CM | POA: Insufficient documentation

## 2019-11-20 DIAGNOSIS — M7989 Other specified soft tissue disorders: Secondary | ICD-10-CM | POA: Insufficient documentation

## 2019-11-20 LAB — CBC WITH DIFFERENTIAL (CANCER CENTER ONLY)
Abs Immature Granulocytes: 0.07 10*3/uL (ref 0.00–0.07)
Basophils Absolute: 0 10*3/uL (ref 0.0–0.1)
Basophils Relative: 1 %
Eosinophils Absolute: 0.2 10*3/uL (ref 0.0–0.5)
Eosinophils Relative: 2 %
HCT: 34.6 % — ABNORMAL LOW (ref 36.0–46.0)
Hemoglobin: 11.1 g/dL — ABNORMAL LOW (ref 12.0–15.0)
Immature Granulocytes: 1 %
Lymphocytes Relative: 21 %
Lymphs Abs: 1.8 10*3/uL (ref 0.7–4.0)
MCH: 27.8 pg (ref 26.0–34.0)
MCHC: 32.1 g/dL (ref 30.0–36.0)
MCV: 86.7 fL (ref 80.0–100.0)
Monocytes Absolute: 0.6 10*3/uL (ref 0.1–1.0)
Monocytes Relative: 7 %
Neutro Abs: 6 10*3/uL (ref 1.7–7.7)
Neutrophils Relative %: 68 %
Platelet Count: 438 10*3/uL — ABNORMAL HIGH (ref 150–400)
RBC: 3.99 MIL/uL (ref 3.87–5.11)
RDW: 15.2 % (ref 11.5–15.5)
WBC Count: 8.7 10*3/uL (ref 4.0–10.5)
nRBC: 0.5 % — ABNORMAL HIGH (ref 0.0–0.2)

## 2019-11-20 LAB — COMPREHENSIVE METABOLIC PANEL
ALT: 18 U/L (ref 0–44)
AST: 14 U/L — ABNORMAL LOW (ref 15–41)
Albumin: 3.3 g/dL — ABNORMAL LOW (ref 3.5–5.0)
Alkaline Phosphatase: 75 U/L (ref 38–126)
Anion gap: 10 (ref 5–15)
BUN: 11 mg/dL (ref 6–20)
CO2: 24 mmol/L (ref 22–32)
Calcium: 9.9 mg/dL (ref 8.9–10.3)
Chloride: 104 mmol/L (ref 98–111)
Creatinine, Ser: 0.65 mg/dL (ref 0.44–1.00)
GFR calc Af Amer: >60 mL/min (ref >60)
GFR calc non Af Amer: >60 mL/min (ref >60)
Glucose, Bld: 126 mg/dL — ABNORMAL HIGH (ref 70–99)
Potassium: 4.1 mmol/L (ref 3.5–5.1)
Sodium: 138 mmol/L (ref 135–145)
Total Bilirubin: <0.2 mg/dL — ABNORMAL LOW (ref 0.3–1.2)
Total Protein: 7.5 g/dL (ref 6.5–8.1)

## 2019-11-20 LAB — PROTIME-INR
INR: 2.8 — ABNORMAL HIGH (ref 0.8–1.2)
Prothrombin Time: 28.5 seconds — ABNORMAL HIGH (ref 11.4–15.2)

## 2019-11-21 ENCOUNTER — Ambulatory Visit (HOSPITAL_COMMUNITY)
Admission: RE | Admit: 2019-11-21 | Discharge: 2019-11-21 | Disposition: A | Discharge status: Home / Self Care | Payer: Self-pay | Source: Ambulatory Visit | Attending: Internal Medicine | Admitting: Internal Medicine

## 2019-11-21 DIAGNOSIS — M5416 Radiculopathy, lumbar region: Secondary | ICD-10-CM | POA: Insufficient documentation

## 2019-11-25 ENCOUNTER — Ambulatory Visit
Admission: RE | Admit: 2019-11-25 | Discharge: 2019-11-25 | Disposition: A | Discharge status: Home / Self Care | Payer: No Typology Code available for payment source | Source: Ambulatory Visit | Attending: Obstetrics and Gynecology | Admitting: Obstetrics and Gynecology

## 2019-11-25 ENCOUNTER — Other Ambulatory Visit: Payer: Self-pay

## 2019-11-25 ENCOUNTER — Ambulatory Visit: Payer: Self-pay | Admitting: *Deleted

## 2019-11-25 VITALS — BP 138/90 | Temp 97.3°F | Wt 251.6 lb

## 2019-11-25 DIAGNOSIS — Z1231 Encounter for screening mammogram for malignant neoplasm of breast: Secondary | ICD-10-CM

## 2019-11-25 DIAGNOSIS — Z01419 Encounter for gynecological examination (general) (routine) without abnormal findings: Secondary | ICD-10-CM

## 2019-11-25 NOTE — Progress Notes (Signed)
[name] is a 44 y.o. [phone] female who presents to [facility] clinic today with no complaints.    Pap Smear: Pap smear completed today. Last Pap smear was in September 2016 at the [facility] clinic and was normal per patient. Per patient has no history of an abnormal Pap smear. Last Pap smear result is not available in Epic.   Physical exam: Breasts Right breast slightly larger than left breast that per patient is normal for her. No skin abnormalities bilateral breasts. No nipple retraction bilateral breasts. No nipple discharge bilateral breasts. No lymphadenopathy. No lumps palpated bilateral breasts. No complaints of pain or tenderness on exam.       Pelvic/Bimanual Ext Genitalia No lesions, no swelling and no discharge observed on external genitalia.        Vagina Vagina pink and normal texture. No lesions or discharge observed in vagina.        Cervix Cervix is present. Cervix pink and of normal texture. Scant amount of yellowish colored discharge observed on cervical os.   Uterus Uterus is present and palpable. Uterus in normal position and normal size.        Adnexae Bilateral ovaries present and unable to palpate. No tenderness on palpation.         Rectovaginal No rectal exam completed today since patient had no rectal complaints. No skin abnormalities observed on exam.     Smoking History: Patient has never smoked.   Patient Navigation: Patient education provided. Access to services provided for patient through [facility] program. Spanish interpreter Natale Lay from [facility] provided.   Breast and Cervical Cancer Risk Assessment: Patient has family history of her mother and two sisters having breast cancer. Patient has no known genetic mutations or history of radiation treatment to the chest before age 56. Patient does not have history of cervical dysplasia, immunocompromised, or DES exposure in-utero.  Risk Assessment    Risk Scores       11/25/2019   Last edited by: Narda Rutherford, LPN   5-year risk: 4 %   Lifetime risk: 31.3 %          A: BCCCP exam with pap smear No complaints.  P: Referred patient to the [facility] for a screening mammogram. Appointment scheduled Tuesday, November 25, 2019 at 1240.  Priscille Heidelberg, RN 11/25/2019 11:26 AM

## 2019-11-25 NOTE — Patient Instructions (Signed)
Explained breast self awareness with [name]. Pap smear completed today. Let patient know BCCCP will cover Pap smears and HPV typing every 5 years unless has a history of abnormal Pap smears. Referred patient to the [facility] of [facility] for a screening mammogram. Appointment scheduled Tuesday, November 25, 2019 at 1240. Patient aware of appointment and will be there. Let patient know will follow up with her within the next couple weeks with results of her Pap smear by letter or phone. Informed patient that the Breast Center will follow-up with her within the next couple of weeks with results of her mammogram by letter or phone. [name] verbalized understanding.  [name], [name], RN 11:26 AM

## 2019-11-26 LAB — CYTOLOGY - PAP
Comment: NEGATIVE
Diagnosis: NEGATIVE
High risk HPV: NEGATIVE

## 2019-11-27 ENCOUNTER — Telehealth: Payer: Self-pay

## 2019-11-27 ENCOUNTER — Inpatient Hospital Stay: Payer: Self-pay

## 2019-11-27 ENCOUNTER — Other Ambulatory Visit: Payer: Self-pay

## 2019-11-27 DIAGNOSIS — Z23 Encounter for immunization: Secondary | ICD-10-CM

## 2019-11-27 NOTE — Telephone Encounter (Signed)
Via Natale Lay, Spanish Interpreter, Patient informed Pap/HPV negative results, next pap in 5 years. Patient was pleased with results, verbalized understanding.

## 2019-12-01 ENCOUNTER — Inpatient Hospital Stay: Payer: Self-pay

## 2019-12-01 NOTE — Progress Notes (Signed)
[facility]   [phone] [phone]   Clinic Follow up Note   Patient Care Team: Malachy Mood, MD as PCP - General (Hematology) Levert Feinstein, MD as Consulting Physician (Oncology) [name] (Hematology and Oncology) Elwin Mocha, MD as Consulting Physician (Ophthalmology) Justice Deeds, MD (Internal Medicine)  Date of Service:  12/04/2019  CHIEF COMPLAINT: F/u ofrefractoryITP,acutesubmassive bilateralPE, left DVT   PREVIOUS AND CURRENT THERAPY:  -Promacta 25mg  daily starting 10/22/18. Stopped 04/08/18 due to recurrent flares.  -Nplateq2weeks starting in 2 weeks. Increased to weekly on 08/11/19 due to recurrent ITP flares.Willgive her 2 mcg/kg if platelet normal, and increase to 3 mcg/kg if platelet less than 150K. Increased to 62mcg/kg starting 09/03/19.Increased to 59mcg/kg on 09/10/19 and increase again to 64mcg/kg starting 09/17/19.Due toDVT and PE, holding Nplate since 10/13/19 due to DVTunless plt <5K -Periodic use of steroids (decadron) and IVIG  -Rituxan weekly X4, completed 11/03/2019 -Coumadin starting 10/15/19, changed to 7.5mg  on Mondays, 5mg  daily for the rest of week on 12/04/2019  INTERVAL HISTORY:  [name] is here for a follow up of ITP. She presents to the clinic with her Spanish interpreter.  She is clinically stable overall.  She still complains of low back pain which radiated to the right thigh, she has been taking Tylenol as needed.  Her menstrual period is very heavy with blood clots, she saw GYN a few days ago but did not discuss management of menorrhagia.  She also noticed mild gum bleeding in the morning, no skin bruises, or other signs of bleeding.  She still has not intermittent headaches and the dizziness, overall stable.  She has not started physical therapy yet.  Review of systems otherwise negative.   MEDICAL HISTORY:  Past Medical History:  Diagnosis Date  . Anemia   . Chronic ITP  (idiopathic thrombocytopenia) (HCC)   . Depression   . Headache   . Lumbar radiculopathy, right 11/13/2019  . 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 BREAST SURGERY  biopsy  . LOWER EXTREMITY VENOGRAPHY Left 10/08/2019   Procedure: LOWER EXTREMITY VENOGRAPHY;  Surgeon: 10/07/2015, MD;  Location: [facility] INVASIVE CV LAB;  Service: Cardiovascular;  Laterality: Left;  . PERIPHERAL VASCULAR INTERVENTION Left 10/08/2019   Procedure: PERIPHERAL VASCULAR INTERVENTION;  Surgeon: Cephus Shelling, MD;  Location: [facility] INVASIVE CV LAB;  Service: Cardiovascular;  Laterality: Left;  LT COMMON ILIAC VEIN  . PERIPHERAL VASCULAR THROMBECTOMY N/A 10/08/2019   Procedure: PERIPHERAL VASCULAR THROMBECTOMY;  Surgeon: [facility], MD;  Location: [facility] INVASIVE CV LAB;  Service: Cardiovascular;  Laterality: N/A;  . SPLENECTOMY, TOTAL N/A 09/16/2015   Procedure: OPEN SPLENECTOMY;  Surgeon: Cephus Shelling III, MD;  Location: [facility] OR;  Service: General;  Laterality: N/A;    I have reviewed the social history and family history with the patient and they are unchanged from previous note.  ALLERGIES:  has No Known Allergies.  MEDICATIONS:  Current Outpatient Medications  Medication Sig Dispense Refill  . gabapentin (NEURONTIN) 300 MG capsule Take 1 capsule (300 mg total) by mouth 2 (two) times daily. 60 capsule 3  . pantoprazole (PROTONIX) 40 MG tablet Take 1 tablet (40 mg total) by mouth daily. 30 tablet 0  . warfarin (COUMADIN) 5 MG tablet 7.5mg  daily on Mondays, and 5mg  daily for the rest of week 35 tablet 3   No current  facility-administered medications for this visit.    PHYSICAL EXAMINATION: ECOG PERFORMANCE STATUS: 1 - Symptomatic but completely ambulatory  Vitals:   12/04/19 0927 12/04/19 0928  BP: (!) 83/48 (!) 94/54  Pulse: 76   Resp: 18   Temp: (!) 97.2 F (36.2 C)     SpO2: 100%    Filed Weights   12/04/19 0927  Weight: 250 lb 9.6 oz (113.7 kg)    GENERAL:alert, no distress and comfortable SKIN: skin color, texture, turgor are normal, no rashes or significant lesions EYES: normal, Conjunctiva are pink and non-injected, sclera clear ORAL: No gum or mucosal bleeding NECK: supple, thyroid normal size, non-tender, without nodularity LYMPH:  no palpable lymphadenopathy in the cervical, axillary  LUNGS: clear to auscultation and percussion with normal breathing effort HEART: regular rate & rhythm and no murmurs and no lower extremity edema ABDOMEN:abdomen soft, non-tender and normal bowel sounds Musculoskeletal:no cyanosis of digits and no clubbing  NEURO: alert & oriented x 3 with fluent speech, no focal motor/sensory deficits  LABORATORY DATA:  I have reviewed the data as listed CBC Latest Ref Rng & Units 12/04/2019 11/20/2019 11/13/2019  WBC 4.0 - 10.5 K/uL 8.9 8.7 9.5  Hemoglobin 12.0 - 15.0 g/dL 10.6(L) 11.1(L) 12.4  Hematocrit 36 - 46 % 33.9(L) 34.6(L) 37.3  Platelets 150 - 400 K/uL 524(H) 438(H) 376     CMP Latest Ref Rng & Units 12/04/2019 11/20/2019 11/13/2019  Glucose 70 - 99 mg/dL 559(R) 416(L) 845(X)  BUN 6 - 20 mg/dL 13 11 11   Creatinine 0.44 - 1.00 mg/dL 6.46 8.03  Sodium 135 - 145 mmol/L 140 138 135  Potassium 3.5 - 5.1 mmol/L 3.8 4.1 4.1  Chloride 98 - 111 mmol/L 107 104 104  CO2 22 - 32 mmol/L 22 24 16(L)  Calcium 8.9 - 10.3 mg/dL 9.2 9.9 9.6  Total Protein 6.5 - 8.1 g/dL 6.9 7.5 2.12)  Total Bilirubin 0.3 - 1.2 mg/dL 2.4(M) 2.5(O) <0.3(B)  Alkaline Phos 38 - 126 U/L 62 75 80  AST 15 - 41 U/L 19 14(L) 14(L)  ALT 0 - 44 U/L 22 18 12       RADIOGRAPHIC STUDIES: I have personally reviewed the radiological images as listed and agreed with the findings in the report. No results found.   ASSESSMENT & PLAN:  [name] is a 43 y.o. female with    1. Recurrent refractory ITP -She has a history of  recurrentand refractoryITP. She is s/p splenectomy and treated withsteroid, IVIG,NPlate,Rituxanand chemoin the past (the first episode) dueto refractory disease.  -She has had repeated ITP Flares with occurrences in 02/2018, 08/04/19, 08/2019 and 09/2019. 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  -Due to her frequent recurrenceof ITP,she recently completed 4 doses of Rituxan 10/16/19-11/06/19. -We have heldNplatesince 7/26/21due to thrombosis, may consider if plt drops <5K -Will periodically use of steroids (decadron) and IVIG for acute flare  -Clinically stable, she does not have mild gum bleeding from Coumadin, no other signs of bleeding. 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 She was bridged with Lovenox and discharged 7/23/21on Coumadin. -She  was scheduled to begin outpatient Rituxan for refractory ITP on 7/26 but presented to ED with severe thrombocytopenia platelet less than 5K and gum bleeding.  -10/13/19 CTA showed at least submassive bilateral PE. Discharged on 8/2/21on Coumadin 7.5 mg daily. Dose adjusted as indicated. She is currently on Coumadin 5 mg daily except 7.5 mg on Mondays and Fridays -INR 2.9 today, she does have menorrhagia and gum bleeding, I will reduce Coumadin to 5 mg daily except 7.5 mg on Mondays  2.History of small foci ofsubarachnoid hemorrhage,Secondary to #1 -recovered well overall.Will continue to monitor. -Multiple head CTs have been stable lately  3.Headaches, Chronic dizziness, Hypotension -ChronicHA, since 03/2019 hospitalization. She  has been seen by Dr. Barbaraann Cao who felt symptoms were consistent with occipital neuralgia. She has been referred to Dr. Ollen Bowl for occipital nerve blockade.  -She is on Meclizine for dizziness.  -I reviewed her recent lumbar MRI findings, and she will follow up with Dr. Barbaraann Cao  4. COVID19 (+) in early 04/2019, treated monoclonial antibodies. S/p COVID vaccine series.   5. Financial Support  -She pays out of pocket but submits her bills and receives pay assistance. -Her application for Medicaid was denied -She does not have orange card currently. She was approved for drug assistance grant, but has not used it yet. Blake Divine will f/u with her. She will also f/u with our financial office.   6. Depression -Has tried citalopram and sertraline in the past. She is no longer on Cymbalta due to worsened depression. I started her on Celexa (11/05/19).  7. Leg weakness, Falls  -Likely related to intermittent steroid use.  -PT referral made, she has not started yet, I asked her to call them back    PLAN: -lab reviewed, she has developed mild anemia and thrombocytosis likely iron deficiency from menorrhagia -will decrease Coumadin dose, she will take 5 mg daily except 7.5 mg on Mondays -I recommend her to start oral iron ferrous sulfate 1 to 2 tablets daily -Lab in 3 weeks, including iron level -Lab and follow-up in 6 weeks -will touch base with her GYN to see if she is a candidate for IUD for her menorrhagia   No problem-specific Assessment & Plan notes found for this encounter.   Orders Placed This Encounter  Procedures  . Ferritin    Standing Status:   Standing    Number of Occurrences:   10    Standing Expiration Date:   12/03/2020  . Iron and TIBC    Standing Status:   Standing    Number of Occurrences:   10    Standing Expiration Date:   12/03/2020   All questions were answered. The patient knows to call the clinic with any problems, questions or concerns. No barriers to learning was  detected. The total time spent in the appointment was 30 minutes.     Malachy Mood, MD 12/04/2019   I, Delphina Cahill, am acting as scribe for Malachy Mood, MD.  I have reviewed the above documentation for accuracy and completeness, and I agree with the above.

## 2019-12-04 ENCOUNTER — Telehealth: Payer: Self-pay | Admitting: Hematology

## 2019-12-04 ENCOUNTER — Other Ambulatory Visit: Payer: Self-pay

## 2019-12-04 ENCOUNTER — Inpatient Hospital Stay (HOSPITAL_BASED_OUTPATIENT_CLINIC_OR_DEPARTMENT_OTHER): Payer: Self-pay | Admitting: Hematology

## 2019-12-04 ENCOUNTER — Inpatient Hospital Stay: Payer: Self-pay

## 2019-12-04 ENCOUNTER — Encounter: Payer: Self-pay | Admitting: Hematology

## 2019-12-04 VITALS — BP 94/54 | HR 76 | Temp 97.2°F | Resp 18 | Ht <= 58 in | Wt 250.6 lb

## 2019-12-04 DIAGNOSIS — D693 Immune thrombocytopenic purpura: Secondary | ICD-10-CM

## 2019-12-04 DIAGNOSIS — I82402 Acute embolism and thrombosis of unspecified deep veins of left lower extremity: Secondary | ICD-10-CM

## 2019-12-04 DIAGNOSIS — Z7901 Long term (current) use of anticoagulants: Secondary | ICD-10-CM

## 2019-12-04 DIAGNOSIS — D500 Iron deficiency anemia secondary to blood loss (chronic): Secondary | ICD-10-CM

## 2019-12-04 LAB — PROTIME-INR
INR: 2.9 — ABNORMAL HIGH (ref 0.8–1.2)
Prothrombin Time: 29.2 seconds — ABNORMAL HIGH (ref 11.4–15.2)

## 2019-12-04 LAB — CBC WITH DIFFERENTIAL (CANCER CENTER ONLY)
Abs Immature Granulocytes: 0.06 10*3/uL (ref 0.00–0.07)
Basophils Absolute: 0.1 10*3/uL (ref 0.0–0.1)
Basophils Relative: 1 %
Eosinophils Absolute: 0.2 10*3/uL (ref 0.0–0.5)
Eosinophils Relative: 2 %
HCT: 33.9 % — ABNORMAL LOW (ref 36.0–46.0)
Hemoglobin: 10.6 g/dL — ABNORMAL LOW (ref 12.0–15.0)
Immature Granulocytes: 1 %
Lymphocytes Relative: 28 %
Lymphs Abs: 2.5 10*3/uL (ref 0.7–4.0)
MCH: 26.8 pg (ref 26.0–34.0)
MCHC: 31.3 g/dL (ref 30.0–36.0)
MCV: 85.6 fL (ref 80.0–100.0)
Monocytes Absolute: 1.1 10*3/uL — ABNORMAL HIGH (ref 0.1–1.0)
Monocytes Relative: 12 %
Neutro Abs: 5.1 10*3/uL (ref 1.7–7.7)
Neutrophils Relative %: 56 %
Platelet Count: 524 10*3/uL — ABNORMAL HIGH (ref 150–400)
RBC: 3.96 MIL/uL (ref 3.87–5.11)
RDW: 14.5 % (ref 11.5–15.5)
WBC Count: 8.9 10*3/uL (ref 4.0–10.5)
nRBC: 0.7 % — ABNORMAL HIGH (ref 0.0–0.2)

## 2019-12-04 LAB — CMP (CANCER CENTER ONLY)
ALT: 22 U/L (ref 0–44)
AST: 19 U/L (ref 15–41)
Albumin: 3.4 g/dL — ABNORMAL LOW (ref 3.5–5.0)
Alkaline Phosphatase: 62 U/L (ref 38–126)
Anion gap: 11 (ref 5–15)
BUN: 13 mg/dL (ref 6–20)
CO2: 22 mmol/L (ref 22–32)
Calcium: 9.2 mg/dL (ref 8.9–10.3)
Chloride: 107 mmol/L (ref 98–111)
Creatinine: 0.65 mg/dL (ref 0.44–1.00)
GFR, Est AFR Am: >60 mL/min (ref >60)
GFR, Estimated: >60 mL/min (ref >60)
Glucose, Bld: 115 mg/dL — ABNORMAL HIGH (ref 70–99)
Potassium: 3.8 mmol/L (ref 3.5–5.1)
Sodium: 140 mmol/L (ref 135–145)
Total Bilirubin: 0.2 mg/dL — ABNORMAL LOW (ref 0.3–1.2)
Total Protein: 6.9 g/dL (ref 6.5–8.1)

## 2019-12-04 MED ORDER — WARFARIN SODIUM 5 MG PO TABS: ORAL_TABLET | ORAL | 3 refills | Status: DC

## 2019-12-04 NOTE — Telephone Encounter (Signed)
Scheduled per los. Gave avs and calendar  

## 2019-12-05 MED FILL — WARFARIN SODIUM 5 MG TABLET: 5 | 30 days supply | Qty: 30 | Fill #1

## 2019-12-11 ENCOUNTER — Other Ambulatory Visit: Payer: Self-pay

## 2019-12-11 ENCOUNTER — Inpatient Hospital Stay (HOSPITAL_BASED_OUTPATIENT_CLINIC_OR_DEPARTMENT_OTHER): Payer: Self-pay | Admitting: Internal Medicine

## 2019-12-11 ENCOUNTER — Ambulatory Visit: Payer: Self-pay | Admitting: Physician Assistant

## 2019-12-11 VITALS — BP 111/56 | HR 83 | Temp 98.1°F | Resp 17 | Ht <= 58 in | Wt 251.4 lb

## 2019-12-11 DIAGNOSIS — M5416 Radiculopathy, lumbar region: Secondary | ICD-10-CM

## 2019-12-11 MED ORDER — GABAPENTIN 300 MG PO CAPS: 300.0000 mg | ORAL_CAPSULE | Freq: Two times a day (BID) | ORAL | 3 refills | Status: DC

## 2019-12-11 NOTE — Progress Notes (Signed)
[facility] at [facility] [address]  [place], [place] [address] [phone]   Interval Evaluation  Date of Service: 12/11/19 Patient Name: [name] Patient MRN: [id number] Patient DOB: 1975-07-24 Provider: Henreitta Leber, MD  Identifying Statement:  [name] is a 44 y.o. female with headaches, gait impairment   Primary Cancer: ITP (heme, non-neoplastic)  Interval History:  [name] presents today for follow up after recent MRI lumbar spine.  She describes modest improvement in pain and tingling affecting her right leg since starting the gabapentin.  No improvement in numbness.  Gait has been "a bit better recently".  No worsening of headache symptoms.  Does complain of sharp pain and some swelling in the bottom/middle of her left foot.  (11/13/19) Patient presents today for follow up after development of new symptoms.  She describes numbness and pain along the lateral aspect of her right leg, radiating down from her lower back.  Numbness is describes as "pins and needles".  Symptoms began immediately following surgery for removal and stenting of large left leg DVT on 10/08/19.  The numbness and discomfort is not accompanied by frank weakness, although her gait is very limited; she is relying on assistance in the home for ambulation at this time.  Overall burden of symptoms has not improved over the past month.  Back pain is still present as well.  Fortunately, headaches are somewhat improved from prior.  Currently on coumadin.  H+P (08/28/19) Patient presents to discuss long standing history of headaches, as well as visual and gait impairments.  She describes chronic, daily pain in the back of her head, radiating up from neck.  No spread to frontal or temporal regions.  No associated nausea, vomiting, photophobia, or other migrainous features.  Symptoms have been occurring at least 4-5 years, if not longer.  Tylenol had  helped previously but no longer very effective.  She also describes visual floaters which come and go; she was evaluated in depth by ophthalmology without any etiology uncovered.  Also complains of feeling "dizzy" for several years when walking, "feels like I could fall backwards".  She has never fallen, and does not need assistance to ambulate.  Continues to receive Nplate and decadron for chronic ITP through Dr. Mosetta Putt.  Last had CT head 6 months prior which was normal.       Medications: Current Outpatient Medications on File Prior to Visit  Medication Sig Dispense Refill  . ferrous sulfate 324 MG TBEC Take 324 mg by mouth daily with breakfast.    . gabapentin (NEURONTIN) 300 MG capsule Take 1 capsule (300 mg total) by mouth 2 (two) times daily. 60 capsule 3  . pantoprazole (PROTONIX) 40 MG tablet Take 1 tablet (40 mg total) by mouth daily. 30 tablet 0  . warfarin (COUMADIN) 5 MG tablet 7.5mg  daily on Mondays, and 5mg  daily for the rest of week 35 tablet 3   No current facility-administered medications on file prior to visit.    Allergies: No Known Allergies Past Medical History:  Past Medical History:  Diagnosis Date  . Anemia   . Chronic ITP (idiopathic thrombocytopenia) (HCC)   . Depression   . Headache   . Lumbar radiculopathy, right 11/13/2019  . 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   Past Surgical History:  Past Surgical History:  Procedure Laterality Date  . BREAST SURGERY  biopsy  . LOWER EXTREMITY VENOGRAPHY Left 10/08/2019  Procedure: LOWER EXTREMITY VENOGRAPHY;  Surgeon: Cephus Shellinglark, Christopher J, MD;  Location: [facility] INVASIVE CV LAB;  Service: Cardiovascular;  Laterality: Left;  . PERIPHERAL VASCULAR INTERVENTION Left 10/08/2019   Procedure: PERIPHERAL VASCULAR INTERVENTION;  Surgeon: Cephus Shellinglark, Christopher J, MD;  [facility];  Service: Cardiovascular;  Laterality: Left;  LT COMMON ILIAC VEIN  . PERIPHERAL VASCULAR  THROMBECTOMY N/A 10/08/2019   Procedure: PERIPHERAL VASCULAR THROMBECTOMY;  Surgeon: Cephus Shellinglark, Christopher J, MD;  Location: [facility] INVASIVE CV LAB;  Service: Cardiovascular;  Laterality: N/A;  . SPLENECTOMY, TOTAL N/A 09/16/2015   Procedure: OPEN SPLENECTOMY;  Surgeon: Chevis PrettyPaul Toth III, MD;  Location: [facility] OR;  Service: General;  Laterality: N/A;   Social History:  Social History   Socioeconomic History  . Marital status: Married    Spouse name: Not on file  . Number of children: 6  . Years of education: Not on file  . Highest education level: 9th grade  Occupational History  . Occupation: [employer]  Tobacco Use  . Smoking status: Never Smoker  . Smokeless tobacco: Never Used  Vaping Use  . Vaping Use: Never used  Substance and Sexual Activity  . Alcohol use: No    Alcohol/week: 0.0 standard drinks  . Drug use: No  . Sexual activity: Not Currently  Other Topics Concern  . Not on file  Social History Narrative  . Not on file   Social Determinants of Health   Financial Resource Strain:   . Difficulty of Paying Living Expenses: Not on file  Food Insecurity:   . Worried About Programme researcher, broadcasting/film/videounning Out of Food in the Last Year: Not on file  . Ran Out of Food in the Last Year: Not on file  Transportation Needs: No Transportation Needs  . Lack of Transportation (Medical): No  . Lack of Transportation (Non-Medical): No  Physical Activity:   . Days of Exercise per Week: Not on file  . Minutes of Exercise per Session: Not on file  Stress:   . Feeling of Stress : Not on file  Social Connections:   . Frequency of Communication with Friends and Family: Not on file  . Frequency of Social Gatherings with Friends and Family: Not on file  . Attends Religious Services: Not on file  . Active Member of Clubs or Organizations: Not on file  . Attends BankerClub or Organization Meetings: Not on file  . Marital Status: Not on file  Intimate Partner Violence:   . Fear of Current or Ex-Partner: Not on file    . Emotionally Abused: Not on file  . Physically Abused: Not on file  . Sexually Abused: Not on file   Family History:  Family History  Problem Relation Age of Onset  . Diabetes Mother   . Breast cancer Mother   . Diabetes Father   . Uterine cancer Sister   . Breast cancer Sister   . Breast cancer Sister   . Breast cancer Sister     Review of Systems: Constitutional: Doesn't report fevers, chills or abnormal weight loss Eyes: Doesn't report blurriness of vision Ears, nose, mouth, throat, and face: Doesn't report sore throat Respiratory: Doesn't report cough, dyspnea or wheezes Cardiovascular: Doesn't report palpitation, chest discomfort  Gastrointestinal:  Doesn't report nausea, constipation, diarrhea GU: Doesn't report incontinence Skin: Doesn't report skin rashes Neurological: Per HPI Musculoskeletal: Doesn't report joint pain Behavioral/Psych: Doesn't report anxiety  Physical Exam: Vitals:   12/11/19 1158  BP: (!) 111/56  Pulse: 83  Resp: 17  Temp:  98.1 F (36.7 C)  SpO2: 100%   KPS: 90. General: Alert, cooperative, pleasant, in no acute distress Head: Normal EENT: No conjunctival injection or scleral icterus.  Lungs: Resp effort normal Cardiac: Regular rate Abdomen: Non-distended abdomen Skin: No rashes cyanosis or petechiae. Extremities: No clubbing or edema  Neurologic Exam: Mental Status: Awake, alert, attentive to examiner. Oriented to self and environment. Language is fluent with intact comprehension.  Cranial Nerves: Visual acuity is grossly normal. Visual fields are full. Extra-ocular movements intact. No ptosis. Face is symmetric Motor: Tone and bulk are normal. Power is full in both arms and legs. Reflexes are symmetric, no pathologic reflexes present.  Sensory: Impaired light touch right leg in L5/S1 distribution Gait: Sensory dystaxia  Labs: I have reviewed the data as listed    Component Value Date/Time   NA 140 12/04/2019 0822   K 3.8  12/04/2019 0822   CL 107 12/04/2019 0822   CO2 22 12/04/2019 0822   GLUCOSE 115 (H) 12/04/2019 0822   BUN 13 12/04/2019 0822   CREATININE 0.65 12/04/2019 0822   CALCIUM 9.2 12/04/2019 0822   PROT 6.9 12/04/2019 0822   ALBUMIN 3.4 (L) 12/04/2019 0822   AST 19 12/04/2019 0822   ALT 22 12/04/2019 0822   ALKPHOS 62 12/04/2019 0822   BILITOT 0.2 (L) 12/04/2019 0822   GFRNONAA >60 12/04/2019 0822   GFRAA >60 12/04/2019 0822   Lab Results  Component Value Date   WBC 8.9 12/04/2019   NEUTROABS 5.1 12/04/2019   HGB 10.6 (L) 12/04/2019   HCT 33.9 (L) 12/04/2019   MCV 85.6 12/04/2019   PLT 524 (H) 12/04/2019    Imaging:  MR Lumbar Spine Wo Contrast  Result Date: 11/21/2019 CLINICAL DATA:  Lumbar radiculopathy, right. Low back pain, progressive neurologic deficit. Additional history provided: History of lumbar radiculopathy localizing primarily to the right L5/S1 nerve roots, sensory symptoms and pain quite prominent and affecting gait/ambulation. EXAM: MRI LUMBAR SPINE WITHOUT CONTRAST TECHNIQUE: Multiplanar, multisequence MR imaging of the lumbar spine was performed. No intravenous contrast was administered. COMPARISON:  Radiographs of the lumbar spine 10/16/2005, CT abdomen/pelvis 10/13/2019 FINDINGS: Segmentation: 5 lumbar vertebrae in correlating with prior CT 10/13/2019. Alignment: Straightening of the expected lumbar lordosis. Trace L4-L5 grade 1 retrolisthesis. Vertebrae: Redemonstrated mild chronic L1 superior endplate deformity. Vertebral body height is otherwise maintained. No suspicious osseous lesion or significant marrow edema. Conus medullaris and cauda equina: Conus extends to the L1-L2 level. No signal abnormality within the visualized distal spinal cord. Paraspinal and other soft tissues: No abnormality identified within included portions of the abdomen/retroperitoneum. Paraspinal soft tissues within normal limits. Disc levels: Mild disc degeneration at L4-L5 and L5-S1.  Intervertebral disc height is otherwise maintained. T12-L1: No significant disc herniation or stenosis. L1-L2: No significant disc herniation or stenosis. L2-L3: Mild facet arthrosis. No significant disc herniation or stenosis. L3-L4: Mild facet arthrosis. No significant disc herniation or stenosis. L4-L5: Trace retrolisthesis. Small disc bulge with minimal endplate spurring. Mild facet arthrosis/ligamentum flavum hypertrophy. Mild left subarticular narrowing with slight crowding of the descending left L5 nerve root. Central canal patent. No significant foraminal stenosis. L5-S1: Mild facet arthrosis. No significant disc herniation, spinal canal stenosis or neural foraminal narrowing. IMPRESSION: Redemonstrated mild chronic L1 superior endplate deformity. Lumbar spondylosis as outlined with findings most notably as follows. At L4-L5, there trace retrolisthesis. Mild disc degeneration. Small disc bulge with minimal endplate spurring. Mild facet arthrosis/ligamentum flavum hypertrophy (greater on the left). Mild left subarticular narrowing with slight crowding of  the descending left L5 nerve root. No significant central canal or foraminal stenosis. At L5-S1, there is mild disc degeneration. Mild facet arthrosis. No significant disc herniation or stenosis. Electronically Signed   By: Jackey Loge DO   On: 11/21/2019 08:37   Assessment/Plan Mononeuropathy, Radiculopathy R Leg  [name] is clinically stable to improved today with regards to right leg neuropathic pain.  MRI did not demonstrate structural etiology.  Gabapentin has modestly improved symptom burden.  Recommended continuing Gabapentin 300mg  TID.  Should follow up with PCP for complaints in left foot c/w possible plantar fasciitis.   We appreciate the opportunity to participate in the care of [name].  She may follow up with [name] as needed for neuropathic pain, headaches or other neurologic sequelae.  All questions  were answered. The patient knows to call the clinic with any problems, questions or concerns. No barriers to learning were detected.  I have spent a total of 30 minutes of face-to-face and non-face-to-face time, excluding clinical staff time, preparing to see patient, ordering tests and/or medications, counseling the patient, and independently interpreting results and communicating results to the patient/family/caregiver    [place], MD Medical Director of Neuro-Oncology [facility] at [place] [facility] 12/11/19 12:34 PM

## 2019-12-25 ENCOUNTER — Inpatient Hospital Stay: Payer: Self-pay | Attending: Nurse Practitioner

## 2019-12-25 ENCOUNTER — Other Ambulatory Visit: Payer: Self-pay

## 2019-12-25 DIAGNOSIS — D693 Immune thrombocytopenic purpura: Secondary | ICD-10-CM | POA: Insufficient documentation

## 2019-12-25 DIAGNOSIS — I82402 Acute embolism and thrombosis of unspecified deep veins of left lower extremity: Secondary | ICD-10-CM

## 2019-12-25 DIAGNOSIS — Z5181 Encounter for therapeutic drug level monitoring: Secondary | ICD-10-CM

## 2019-12-25 DIAGNOSIS — D500 Iron deficiency anemia secondary to blood loss (chronic): Secondary | ICD-10-CM

## 2019-12-25 LAB — IRON AND TIBC
Iron: 44 ug/dL (ref 41–142)
Saturation Ratios: 10 % — ABNORMAL LOW (ref 21–57)
TIBC: 444 ug/dL (ref 236–444)
UIBC: 400 ug/dL — ABNORMAL HIGH (ref 120–384)

## 2019-12-25 LAB — PROTIME-INR
INR: 2.3 — ABNORMAL HIGH (ref 0.8–1.2)
Prothrombin Time: 24.5 seconds — ABNORMAL HIGH (ref 11.4–15.2)

## 2019-12-25 LAB — CBC WITH DIFFERENTIAL (CANCER CENTER ONLY)
Abs Immature Granulocytes: 0.02 10*3/uL (ref 0.00–0.07)
Basophils Absolute: 0.1 10*3/uL (ref 0.0–0.1)
Basophils Relative: 1 %
Eosinophils Absolute: 0.2 10*3/uL (ref 0.0–0.5)
Eosinophils Relative: 3 %
HCT: 34.6 % — ABNORMAL LOW (ref 36.0–46.0)
Hemoglobin: 10.8 g/dL — ABNORMAL LOW (ref 12.0–15.0)
Immature Granulocytes: 0 %
Lymphocytes Relative: 34 %
Lymphs Abs: 2.4 10*3/uL (ref 0.7–4.0)
MCH: 27.1 pg (ref 26.0–34.0)
MCHC: 31.2 g/dL (ref 30.0–36.0)
MCV: 86.9 fL (ref 80.0–100.0)
Monocytes Absolute: 0.7 10*3/uL (ref 0.1–1.0)
Monocytes Relative: 10 %
Neutro Abs: 3.6 10*3/uL (ref 1.7–7.7)
Neutrophils Relative %: 52 %
Platelet Count: 497 10*3/uL — ABNORMAL HIGH (ref 150–400)
RBC: 3.98 MIL/uL (ref 3.87–5.11)
RDW: 15.8 % — ABNORMAL HIGH (ref 11.5–15.5)
WBC Count: 6.9 10*3/uL (ref 4.0–10.5)
nRBC: 0.3 % — ABNORMAL HIGH (ref 0.0–0.2)

## 2019-12-25 LAB — CMP (CANCER CENTER ONLY)
ALT: 24 U/L (ref 0–44)
AST: 20 U/L (ref 15–41)
Albumin: 3.7 g/dL (ref 3.5–5.0)
Alkaline Phosphatase: 55 U/L (ref 38–126)
Anion gap: 8 (ref 5–15)
BUN: 13 mg/dL (ref 6–20)
CO2: 24 mmol/L (ref 22–32)
Calcium: 9.2 mg/dL (ref 8.9–10.3)
Chloride: 108 mmol/L (ref 98–111)
Creatinine: 0.65 mg/dL (ref 0.44–1.00)
GFR, Estimated: >60 mL/min (ref >60)
Glucose, Bld: 117 mg/dL — ABNORMAL HIGH (ref 70–99)
Potassium: 3.5 mmol/L (ref 3.5–5.1)
Sodium: 140 mmol/L (ref 135–145)
Total Bilirubin: 0.4 mg/dL (ref 0.3–1.2)
Total Protein: 6.9 g/dL (ref 6.5–8.1)

## 2019-12-25 LAB — FERRITIN: Ferritin: 28 ng/mL (ref 11–307)

## 2019-12-30 ENCOUNTER — Other Ambulatory Visit: Payer: Self-pay

## 2019-12-30 DIAGNOSIS — I82402 Acute embolism and thrombosis of unspecified deep veins of left lower extremity: Secondary | ICD-10-CM

## 2019-12-30 MED ORDER — WARFARIN SODIUM 5 MG PO TABS: ORAL_TABLET | ORAL | 3 refills | Status: DC

## 2020-01-05 ENCOUNTER — Other Ambulatory Visit: Payer: Self-pay

## 2020-01-05 DIAGNOSIS — I82402 Acute embolism and thrombosis of unspecified deep veins of left lower extremity: Secondary | ICD-10-CM

## 2020-01-05 MED ORDER — WARFARIN SODIUM 5 MG PO TABS: ORAL_TABLET | ORAL | 3 refills | Status: DC

## 2020-01-07 ENCOUNTER — Other Ambulatory Visit: Payer: Self-pay

## 2020-01-07 DIAGNOSIS — I82402 Acute embolism and thrombosis of unspecified deep veins of left lower extremity: Secondary | ICD-10-CM

## 2020-01-07 MED ORDER — WARFARIN SODIUM 5 MG PO TABS: ORAL_TABLET | ORAL | 3 refills | Status: DC

## 2020-01-12 NOTE — Progress Notes (Signed)
[facility]   Telephone:([phone] Fax:([phone]   Clinic Follow up Note   Patient Care Team: Malachy Mood, MD as PCP - General (Hematology) Levert Feinstein, MD as Consulting Physician (Oncology) [name] (Hematology and Oncology) Elwin Mocha, MD as Consulting Physician (Ophthalmology) Justice Deeds, MD (Internal Medicine)  Date of Service:  01/15/2020  CHIEF COMPLAINT: F/u ofrefractoryITP,acutesubmassive bilateralPE, left DVT  PREVIOUS AND CURRENT THERAPY: -Promacta 25mg  daily starting 10/22/18. Stopped 04/08/18 due to recurrent flares.  -Nplateq2weeks starting in 2 weeks. Increased to weekly on 08/11/19 due to recurrent ITP flares.Willgive her 2 mcg/kg if platelet normal, and increase to 3 mcg/kg if platelet less than 150K. Increased to 3mcg/kg starting 09/03/19.Increased to 17mcg/kg on 09/10/19 and increase again to 82mcg/kg starting 09/17/19.Due toDVT and PE, holdingNplate since 10/13/19 due to DVTunless plt <5K -Periodic use of steroids (decadron) and IVIG  -Rituxan weekly X4, completed 11/03/2019 -Coumadin starting 10/15/19, changed to 7.5mg  on Mondays, 5mg  daily for the rest of week on 12/04/2019  INTERVAL HISTORY:  [name] is here for a follow up of ITP. She presents to the clinic with her Spanish interpreter 12/06/2019 today.  She is overall doing well, does have mild and persistent gum bleeding, her bowel movement is normal but black, likely related to oral iron.  She denies any other bleedings.  Chronic back pain is stable, no further complaints.  Headaches has resolved.  She is functioning well at home.  All other systems were reviewed with the patient and are negative.  MEDICAL HISTORY:  Past Medical History:  Diagnosis Date  . Anemia   . Chronic ITP (idiopathic thrombocytopenia) (HCC)   . Depression   . Headache   . Lumbar radiculopathy, right 11/13/2019  . Retinal hemorrhage of both eyes 10/05/2015   Due to  severe thrombocytopenia from ITP    SURGICAL HISTORY: Past Surgical History:  Procedure Laterality Date  . BREAST SURGERY  biopsy  . LOWER EXTREMITY VENOGRAPHY Left 10/08/2019   Procedure: LOWER EXTREMITY VENOGRAPHY;  Surgeon: 10/07/2015, MD;  Location: [facility] INVASIVE CV LAB;  Service: Cardiovascular;  Laterality: Left;  . PERIPHERAL VASCULAR INTERVENTION Left 10/08/2019   Procedure: PERIPHERAL VASCULAR INTERVENTION;  Surgeon: Cephus Shelling, MD;  Location: [facility] INVASIVE CV LAB;  Service: Cardiovascular;  Laterality: Left;  LT COMMON ILIAC VEIN  . PERIPHERAL VASCULAR THROMBECTOMY N/A 10/08/2019   Procedure: PERIPHERAL VASCULAR THROMBECTOMY;  Surgeon: [facility], MD;  Location: [facility] INVASIVE CV LAB;  Service: Cardiovascular;  Laterality: N/A;  . SPLENECTOMY, TOTAL N/A 09/16/2015   Procedure: OPEN SPLENECTOMY;  Surgeon: Cephus Shelling III, MD;  Location: [facility] OR;  Service: General;  Laterality: N/A;    I have reviewed the social history and family history with the patient and they are unchanged from previous note.  ALLERGIES:  has No Known Allergies.  MEDICATIONS:  Current Outpatient Medications  Medication Sig Dispense Refill  . ferrous sulfate 324 MG TBEC Take 324 mg by mouth daily with breakfast.    . gabapentin (NEURONTIN) 300 MG capsule Take 1 capsule (300 mg total) by mouth 2 (two) times daily. 60 capsule 3  . pantoprazole (PROTONIX) 40 MG tablet Take 1 tablet (40 mg total) by mouth daily. 30 tablet 0  . warfarin (COUMADIN) 5 MG tablet 7.5mg  daily on Mondays, and 5mg  daily for the rest of week 35 tablet 3   No current facility-administered medications for this visit.    PHYSICAL EXAMINATION: ECOG PERFORMANCE STATUS: 1 - Symptomatic but  completely ambulatory  Vitals:   01/15/20 0843  BP: 99/60  Pulse: 72  Resp: 18  Temp: 97.7 F (36.5 C)  SpO2: 99%   Filed Weights   01/15/20 0843  Weight: 247 lb 4.8 oz (112.2 kg)    GENERAL:alert, no distress and  comfortable SKIN: skin color, texture, turgor are normal, no rashes or significant lesions EYES: normal, Conjunctiva are pink and non-injected, sclera clear NECK: supple, thyroid normal size, non-tender, without nodularity LYMPH:  no palpable lymphadenopathy in the cervical, axillary  LUNGS: clear to auscultation and percussion with normal breathing effort HEART: regular rate & rhythm and no murmurs and no lower extremity edema ABDOMEN:abdomen soft, non-tender and normal bowel sounds Musculoskeletal:no cyanosis of digits and no clubbing  NEURO: alert & oriented x 3 with fluent speech, no focal motor/sensory deficits  LABORATORY DATA:  I have reviewed the data as listed CBC Latest Ref Rng & Units 01/15/2020 12/25/2019 12/04/2019  WBC 4.0 - 10.5 K/uL 9.1 6.9 8.9  Hemoglobin 12.0 - 15.0 g/dL 11.6(L) 10.8(L) 10.6(L)  Hematocrit 36 - 46 % 36.4 34.6(L) 33.9(L)  Platelets 150 - 400 K/uL 411(H) 497(H) 524(H)     CMP Latest Ref Rng & Units 01/15/2020 12/25/2019 12/04/2019  Glucose 70 - 99 mg/dL 818(E) 993(Z) 169(C)  BUN 6 - 20 mg/dL 13 13 13   Creatinine 0.44 - 1.00 mg/dL 7.89 3.81  Sodium 135 - 145 mmol/L 139 140 140  Potassium 3.5 - 5.1 mmol/L 3.4(L) 3.5 3.8  Chloride 98 - 111 mmol/L 107 108 107  CO2 22 - 32 mmol/L 25 24 22   Calcium 8.9 - 10.3 mg/dL 9.0 9.2 9.2  Total Protein 6.5 - 8.1 g/dL 6.6 6.9 6.9  Total Bilirubin 0.3 - 1.2 mg/dL 0.17) 0.4 )  Alkaline Phos 38 - 126 U/L 63 55 62  AST 15 - 41 U/L 16 20 19   ALT 0 - 44 U/L 18 24 22       RADIOGRAPHIC STUDIES: I have personally reviewed the radiological images as listed and agreed with the findings in the report. No results found.   ASSESSMENT & PLAN:  [name] is a 44 y.o. female with    1. Recurrent refractory ITP -She has a history of recurrentand refractoryITP. She is s/p splenectomy and treated withsteroid, IVIG,NPlate,Rituxanand chemoin the past (the first episode) dueto refractory disease.   -She has had repeated ITP Flares with occurrences in 02/2018, 08/04/19, 08/2019 and 09/2019. 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  -Due to her frequent recurrenceof ITP,sherecently completed 4 doses of Rituxan 10/16/19-11/06/19. -We have heldNplatesince 7/26/21due to thrombosis, may consider if plt drops <5K -Will periodically use of steroids (decadron) and IVIG for acute flare  -She received weekly rituximab 4 times in July and August 2021, responded very well,.  It has been normal since then. 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 She was bridged with Lovenox and discharged 7/23/21on Coumadin. -She had submassive bilateral PE on 10/13/19 CTA during her hospitalization for ITP flare. She was Discharged on 8/2/21on Coumadin 7.5 mg daily. Dose adjusted as indicated. She is currently on Coumadin 5 mg daily except 7.5 mg on Mondays  As of 12/04/19 -INR 2.2 today, therapeutic.  She has mild gum bleeding.  3.History of small foci ofsubarachnoid hemorrhage,Secondary to #1 -recovered well overall.Will continue to monitor. -Multiple head CTs have been stable lately  4.Headaches, Chronic dizziness, Hypotension -ChronicHA,  since 03/2019 hospitalization. She has been seen Dr. Barbaraann Cao who felt symptoms were consistent with occipital neuralgia. She will continue to f/u with Dr Barbaraann Cao  -She is on Meclizine for dizziness. by   5. COVID19 (+) in early 04/2019, treated monoclonial antibodies. S/p COVID vaccine series.   6. Financial Support -She pays out of pocket but submits her bills and receives pay assistance. -Her application for Medicaid was denied. She does not have orange card currently.She was approved for drug assistance grant. Blake Divine will f/u with her.  7. Depression -Has tried citalopram and sertraline in the past. She is no longer  on Cymbalta due to worsened depression. I started her on Celexa (11/05/19). Mood stable.   8. Leg weakness, Falls  -Likely related to intermittent steroid use. I previously referred her to PT.  -Improved   PLAN: -Lab reviewed, platelet 411, INR 2.2 -Continue same Coumadin dose 5 mg daily except 7.5 mg on Monday -Follow-up in 3 months -I encouraged her to call medicine clinic to reestablish care with them   No problem-specific Assessment & Plan notes found for this encounter.   No orders of the defined types were placed in this encounter.  All questions were answered. The patient knows to call the clinic with any problems, questions or concerns. No barriers to learning was detected. The total time spent in the appointment was 25 minutes.     Malachy Mood, MD 01/15/2020   I, Delphina Cahill, am acting as scribe for Malachy Mood, MD.   I have reviewed the above documentation for accuracy and completeness, and I agree with the above.

## 2020-01-15 ENCOUNTER — Other Ambulatory Visit: Payer: Self-pay

## 2020-01-15 ENCOUNTER — Inpatient Hospital Stay (HOSPITAL_BASED_OUTPATIENT_CLINIC_OR_DEPARTMENT_OTHER): Payer: Self-pay | Admitting: Hematology

## 2020-01-15 ENCOUNTER — Encounter: Payer: Self-pay | Admitting: Hematology

## 2020-01-15 ENCOUNTER — Inpatient Hospital Stay: Payer: Self-pay

## 2020-01-15 ENCOUNTER — Telehealth: Payer: Self-pay | Admitting: Hematology

## 2020-01-15 VITALS — BP 99/60 | HR 72 | Temp 97.7°F | Resp 18 | Ht <= 58 in | Wt 247.3 lb

## 2020-01-15 DIAGNOSIS — Z7901 Long term (current) use of anticoagulants: Secondary | ICD-10-CM

## 2020-01-15 DIAGNOSIS — Z5181 Encounter for therapeutic drug level monitoring: Secondary | ICD-10-CM

## 2020-01-15 DIAGNOSIS — I82402 Acute embolism and thrombosis of unspecified deep veins of left lower extremity: Secondary | ICD-10-CM

## 2020-01-15 DIAGNOSIS — D693 Immune thrombocytopenic purpura: Secondary | ICD-10-CM

## 2020-01-15 LAB — CBC WITH DIFFERENTIAL (CANCER CENTER ONLY)
Abs Immature Granulocytes: 0.03 10*3/uL (ref 0.00–0.07)
Basophils Absolute: 0.1 10*3/uL (ref 0.0–0.1)
Basophils Relative: 1 %
Eosinophils Absolute: 0.2 10*3/uL (ref 0.0–0.5)
Eosinophils Relative: 2 %
HCT: 36.4 % (ref 36.0–46.0)
Hemoglobin: 11.6 g/dL — ABNORMAL LOW (ref 12.0–15.0)
Immature Granulocytes: 0 %
Lymphocytes Relative: 23 %
Lymphs Abs: 2.1 10*3/uL (ref 0.7–4.0)
MCH: 27.2 pg (ref 26.0–34.0)
MCHC: 31.9 g/dL (ref 30.0–36.0)
MCV: 85.2 fL (ref 80.0–100.0)
Monocytes Absolute: 0.8 10*3/uL (ref 0.1–1.0)
Monocytes Relative: 9 %
Neutro Abs: 6 10*3/uL (ref 1.7–7.7)
Neutrophils Relative %: 65 %
Platelet Count: 411 10*3/uL — ABNORMAL HIGH (ref 150–400)
RBC: 4.27 MIL/uL (ref 3.87–5.11)
RDW: 15.8 % — ABNORMAL HIGH (ref 11.5–15.5)
WBC Count: 9.1 10*3/uL (ref 4.0–10.5)
nRBC: 0 % (ref 0.0–0.2)

## 2020-01-15 LAB — CMP (CANCER CENTER ONLY)
ALT: 18 U/L (ref 0–44)
AST: 16 U/L (ref 15–41)
Albumin: 3.5 g/dL (ref 3.5–5.0)
Alkaline Phosphatase: 63 U/L (ref 38–126)
Anion gap: 7 (ref 5–15)
BUN: 13 mg/dL (ref 6–20)
CO2: 25 mmol/L (ref 22–32)
Calcium: 9 mg/dL (ref 8.9–10.3)
Chloride: 107 mmol/L (ref 98–111)
Creatinine: 0.63 mg/dL (ref 0.44–1.00)
GFR, Estimated: >60 mL/min (ref >60)
Glucose, Bld: 112 mg/dL — ABNORMAL HIGH (ref 70–99)
Potassium: 3.4 mmol/L — ABNORMAL LOW (ref 3.5–5.1)
Sodium: 139 mmol/L (ref 135–145)
Total Bilirubin: <0.2 mg/dL — ABNORMAL LOW (ref 0.3–1.2)
Total Protein: 6.6 g/dL (ref 6.5–8.1)

## 2020-01-15 LAB — PROTIME-INR
INR: 2.2 — ABNORMAL HIGH (ref 0.8–1.2)
Prothrombin Time: 23.8 seconds — ABNORMAL HIGH (ref 11.4–15.2)

## 2020-01-15 NOTE — Telephone Encounter (Signed)
Scheduled appointments per 10/28 los. Spoke to patient who is aware of appointment date and time. Gave patient updated calendar.

## 2020-02-13 NOTE — Progress Notes (Signed)
..  The following Assist/Replace Program for Rituxan from [facility] has been terminated due to no treatment since 11/06/2019.  Last DOS:11/06/2019

## 2020-02-16 ENCOUNTER — Inpatient Hospital Stay: Payer: Self-pay | Attending: Nurse Practitioner

## 2020-02-16 ENCOUNTER — Other Ambulatory Visit: Payer: Self-pay

## 2020-02-16 DIAGNOSIS — Z7901 Long term (current) use of anticoagulants: Secondary | ICD-10-CM

## 2020-02-16 DIAGNOSIS — I82402 Acute embolism and thrombosis of unspecified deep veins of left lower extremity: Secondary | ICD-10-CM

## 2020-02-16 DIAGNOSIS — D693 Immune thrombocytopenic purpura: Secondary | ICD-10-CM | POA: Insufficient documentation

## 2020-02-16 DIAGNOSIS — D500 Iron deficiency anemia secondary to blood loss (chronic): Secondary | ICD-10-CM

## 2020-02-16 LAB — CBC WITH DIFFERENTIAL (CANCER CENTER ONLY)
Abs Immature Granulocytes: 0.09 10*3/uL — ABNORMAL HIGH (ref 0.00–0.07)
Basophils Absolute: 0.1 10*3/uL (ref 0.0–0.1)
Basophils Relative: 1 %
Eosinophils Absolute: 0.3 10*3/uL (ref 0.0–0.5)
Eosinophils Relative: 2 %
HCT: 40.4 % (ref 36.0–46.0)
Hemoglobin: 12.9 g/dL (ref 12.0–15.0)
Immature Granulocytes: 1 %
Lymphocytes Relative: 23 %
Lymphs Abs: 2.4 10*3/uL (ref 0.7–4.0)
MCH: 27 pg (ref 26.0–34.0)
MCHC: 31.9 g/dL (ref 30.0–36.0)
MCV: 84.5 fL (ref 80.0–100.0)
Monocytes Absolute: 0.8 10*3/uL (ref 0.1–1.0)
Monocytes Relative: 8 %
Neutro Abs: 7 10*3/uL (ref 1.7–7.7)
Neutrophils Relative %: 65 %
Platelet Count: 200 10*3/uL (ref 150–400)
RBC: 4.78 MIL/uL (ref 3.87–5.11)
RDW: 15.9 % — ABNORMAL HIGH (ref 11.5–15.5)
WBC Count: 10.7 10*3/uL — ABNORMAL HIGH (ref 4.0–10.5)
nRBC: 0.2 % (ref 0.0–0.2)

## 2020-02-16 LAB — IRON AND TIBC
Iron: 97 ug/dL (ref 41–142)
Saturation Ratios: 22 % (ref 21–57)
TIBC: 430 ug/dL (ref 236–444)
UIBC: 333 ug/dL (ref 120–384)

## 2020-02-16 LAB — PROTIME-INR
INR: 2.6 — ABNORMAL HIGH (ref 0.8–1.2)
Prothrombin Time: 27.1 seconds — ABNORMAL HIGH (ref 11.4–15.2)

## 2020-02-16 LAB — CMP (CANCER CENTER ONLY)
ALT: 16 U/L (ref 0–44)
AST: 14 U/L — ABNORMAL LOW (ref 15–41)
Albumin: 3.6 g/dL (ref 3.5–5.0)
Alkaline Phosphatase: 64 U/L (ref 38–126)
Anion gap: 10 (ref 5–15)
BUN: 11 mg/dL (ref 6–20)
CO2: 23 mmol/L (ref 22–32)
Calcium: 9.4 mg/dL (ref 8.9–10.3)
Chloride: 105 mmol/L (ref 98–111)
Creatinine: 0.66 mg/dL (ref 0.44–1.00)
GFR, Estimated: >60 mL/min (ref >60)
Glucose, Bld: 125 mg/dL — ABNORMAL HIGH (ref 70–99)
Potassium: 3.9 mmol/L (ref 3.5–5.1)
Sodium: 138 mmol/L (ref 135–145)
Total Bilirubin: 0.3 mg/dL (ref 0.3–1.2)
Total Protein: 7 g/dL (ref 6.5–8.1)

## 2020-02-16 LAB — FERRITIN: Ferritin: 26 ng/mL (ref 11–307)

## 2020-02-20 ENCOUNTER — Telehealth: Payer: Self-pay

## 2020-02-20 NOTE — Telephone Encounter (Signed)
Ms [name] left vm regarding lab results.  Results reviewed with Dr. Mosetta Putt.  Ms [name] is to continue with her same dose of coumadin.  HE platelets are WNL.  This information was given to MS [name] via Delorise Royals our spanish interpreter

## 2020-03-15 ENCOUNTER — Inpatient Hospital Stay: Payer: No Typology Code available for payment source | Attending: Nurse Practitioner

## 2020-03-15 ENCOUNTER — Other Ambulatory Visit: Payer: Self-pay

## 2020-03-15 DIAGNOSIS — D693 Immune thrombocytopenic purpura: Secondary | ICD-10-CM | POA: Insufficient documentation

## 2020-03-15 DIAGNOSIS — I82402 Acute embolism and thrombosis of unspecified deep veins of left lower extremity: Secondary | ICD-10-CM

## 2020-03-15 DIAGNOSIS — Z5181 Encounter for therapeutic drug level monitoring: Secondary | ICD-10-CM

## 2020-03-15 DIAGNOSIS — D500 Iron deficiency anemia secondary to blood loss (chronic): Secondary | ICD-10-CM

## 2020-03-15 LAB — CMP (CANCER CENTER ONLY)
ALT: 22 U/L (ref 0–44)
AST: 15 U/L (ref 15–41)
Albumin: 3.6 g/dL (ref 3.5–5.0)
Alkaline Phosphatase: 74 U/L (ref 38–126)
Anion gap: 12 (ref 5–15)
BUN: 12 mg/dL (ref 6–20)
CO2: 21 mmol/L — ABNORMAL LOW (ref 22–32)
Calcium: 9.5 mg/dL (ref 8.9–10.3)
Chloride: 103 mmol/L (ref 98–111)
Creatinine: 0.64 mg/dL (ref 0.44–1.00)
GFR, Estimated: >60 mL/min (ref >60)
Glucose, Bld: 116 mg/dL — ABNORMAL HIGH (ref 70–99)
Potassium: 4.1 mmol/L (ref 3.5–5.1)
Sodium: 136 mmol/L (ref 135–145)
Total Bilirubin: <0.2 mg/dL — ABNORMAL LOW (ref 0.3–1.2)
Total Protein: 7.5 g/dL (ref 6.5–8.1)

## 2020-03-15 LAB — CBC WITH DIFFERENTIAL (CANCER CENTER ONLY)
Abs Immature Granulocytes: 0.08 10*3/uL — ABNORMAL HIGH (ref 0.00–0.07)
Basophils Absolute: 0.1 10*3/uL (ref 0.0–0.1)
Basophils Relative: 1 %
Eosinophils Absolute: 0.2 10*3/uL (ref 0.0–0.5)
Eosinophils Relative: 2 %
HCT: 38.7 % (ref 36.0–46.0)
Hemoglobin: 12.7 g/dL (ref 12.0–15.0)
Immature Granulocytes: 1 %
Lymphocytes Relative: 24 %
Lymphs Abs: 2.5 10*3/uL (ref 0.7–4.0)
MCH: 27.9 pg (ref 26.0–34.0)
MCHC: 32.8 g/dL (ref 30.0–36.0)
MCV: 85.1 fL (ref 80.0–100.0)
Monocytes Absolute: 0.8 10*3/uL (ref 0.1–1.0)
Monocytes Relative: 8 %
Neutro Abs: 6.8 10*3/uL (ref 1.7–7.7)
Neutrophils Relative %: 64 %
Platelet Count: 410 10*3/uL — ABNORMAL HIGH (ref 150–400)
RBC: 4.55 MIL/uL (ref 3.87–5.11)
RDW: 15.9 % — ABNORMAL HIGH (ref 11.5–15.5)
WBC Count: 10.5 10*3/uL (ref 4.0–10.5)
nRBC: 0.2 % (ref 0.0–0.2)

## 2020-03-15 LAB — IRON AND TIBC
Iron: 67 ug/dL (ref 41–142)
Saturation Ratios: 16 % — ABNORMAL LOW (ref 21–57)
TIBC: 425 ug/dL (ref 236–444)
UIBC: 358 ug/dL (ref 120–384)

## 2020-03-15 LAB — PROTIME-INR
INR: 1.7 — ABNORMAL HIGH (ref 0.8–1.2)
Prothrombin Time: 19.8 seconds — ABNORMAL HIGH (ref 11.4–15.2)

## 2020-03-15 LAB — FERRITIN: Ferritin: 21 ng/mL (ref 11–307)

## 2020-03-29 ENCOUNTER — Telehealth: Payer: Self-pay

## 2020-03-29 NOTE — Telephone Encounter (Signed)
-----   Message from Malachy Mood, MD sent at 03/27/2020  9:20 PM EST ----- Please let pt know her INR was not therapeutic, please repeat PT/INR this week, thanks   Malachy Mood  03/27/2020

## 2020-03-29 NOTE — Telephone Encounter (Signed)
I spoke with Alice Argue, Ms [name]'s daughter.  I relayed Dr Latanya Maudlin recommendations and appt date and time.  She verbalized understanding.

## 2020-03-31 ENCOUNTER — Other Ambulatory Visit: Payer: No Typology Code available for payment source

## 2020-04-01 ENCOUNTER — Other Ambulatory Visit: Payer: Self-pay

## 2020-04-01 ENCOUNTER — Telehealth: Payer: Self-pay

## 2020-04-01 ENCOUNTER — Inpatient Hospital Stay: Payer: Self-pay | Attending: Nurse Practitioner

## 2020-04-01 DIAGNOSIS — Z7901 Long term (current) use of anticoagulants: Secondary | ICD-10-CM | POA: Insufficient documentation

## 2020-04-01 DIAGNOSIS — D693 Immune thrombocytopenic purpura: Secondary | ICD-10-CM | POA: Insufficient documentation

## 2020-04-01 DIAGNOSIS — I82402 Acute embolism and thrombosis of unspecified deep veins of left lower extremity: Secondary | ICD-10-CM

## 2020-04-01 DIAGNOSIS — Z5181 Encounter for therapeutic drug level monitoring: Secondary | ICD-10-CM | POA: Insufficient documentation

## 2020-04-01 LAB — PROTIME-INR
INR: 1.3 — ABNORMAL HIGH (ref 0.8–1.2)
Prothrombin Time: 15.9 seconds — ABNORMAL HIGH (ref 11.4–15.2)

## 2020-04-01 MED ORDER — WARFARIN SODIUM 5 MG PO TABS: ORAL_TABLET | ORAL | 3 refills | Status: DC

## 2020-04-01 NOTE — Telephone Encounter (Signed)
Per Dr Mosetta Putt Ms [name] is to increase warfarin to 7.5mg  daily. Recheck PT/INR weekly until her f/u visit on 1/31.  appts made. Delorise Royals, spanish interpreter will relay this information.

## 2020-04-07 ENCOUNTER — Inpatient Hospital Stay: Payer: Self-pay

## 2020-04-07 ENCOUNTER — Other Ambulatory Visit: Payer: Self-pay

## 2020-04-07 DIAGNOSIS — I82402 Acute embolism and thrombosis of unspecified deep veins of left lower extremity: Secondary | ICD-10-CM

## 2020-04-07 DIAGNOSIS — Z5181 Encounter for therapeutic drug level monitoring: Secondary | ICD-10-CM

## 2020-04-07 LAB — PROTIME-INR
INR: 2.3 — ABNORMAL HIGH (ref 0.8–1.2)
Prothrombin Time: 24.2 seconds — ABNORMAL HIGH (ref 11.4–15.2)

## 2020-04-13 ENCOUNTER — Other Ambulatory Visit: Payer: Self-pay

## 2020-04-13 ENCOUNTER — Other Ambulatory Visit: Payer: Self-pay | Admitting: Hematology

## 2020-04-13 ENCOUNTER — Inpatient Hospital Stay: Payer: Self-pay

## 2020-04-13 DIAGNOSIS — I82402 Acute embolism and thrombosis of unspecified deep veins of left lower extremity: Secondary | ICD-10-CM

## 2020-04-13 DIAGNOSIS — Z7901 Long term (current) use of anticoagulants: Secondary | ICD-10-CM

## 2020-04-13 DIAGNOSIS — Z5181 Encounter for therapeutic drug level monitoring: Secondary | ICD-10-CM

## 2020-04-13 DIAGNOSIS — D693 Immune thrombocytopenic purpura: Secondary | ICD-10-CM

## 2020-04-13 LAB — PROTIME-INR
INR: 3.1 — ABNORMAL HIGH (ref 0.8–1.2)
Prothrombin Time: 30.9 seconds — ABNORMAL HIGH (ref 11.4–15.2)

## 2020-04-14 ENCOUNTER — Telehealth: Payer: Self-pay

## 2020-04-14 NOTE — Telephone Encounter (Signed)
[name] our spanish interpreter has relayed this message to Ms [name]

## 2020-04-14 NOTE — Telephone Encounter (Signed)
-----   Message from Malachy Mood, MD sent at 04/13/2020 10:44 AM EST ----- Please let her daughter know her PT/INR result, it's therapeutic and continue current coumadin dose. Let her watch for bleeding since her INR is going up and may be super-therapeutic next week. I am seeing her next Monday. Thanks  Malachy Mood  04/13/2020

## 2020-04-16 NOTE — Progress Notes (Incomplete)
[facility]   Telephone:(336) [phone] Fax:(336) [phone]   Clinic Follow up Note   Patient Care Team: Malachy Mood, MD as PCP - General (Hematology) Levert Feinstein, MD as Consulting Physician (Oncology) [name] (Hematology and Oncology) Elwin Mocha, MD as Consulting Physician (Ophthalmology) Justice Deeds, MD (Internal Medicine)  Date of Service:  04/16/2020  CHIEF COMPLAINT: F/u ofrefractoryITP,acutesubmassive bilateralPE, left DVT  PREVIOUS ANDCURRENT THERAPY: -Promacta 25mg  daily starting 10/22/18. Stopped 04/08/18 due to recurrent flares.  -Nplateq2weeks starting in 2 weeks. Increased to weekly on 08/11/19 due to recurrent ITP flares.Willgive her 2 mcg/kg if platelet normal, and increase to 3 mcg/kg if platelet less than 150K. Increased to 45mcg/kg starting 09/03/19.Increased to 32mcg/kg on 09/10/19 and increase again to 79mcg/kg starting 09/17/19.Due toDVT and PE, holdingNplate since 7/26/21due to DVTunless plt <5K -Periodic use of steroids (decadron) and IVIG  -Rituxan weekly X4,completed 11/03/2019 -Coumadin starting 10/15/19, changed to 7.5mg  on Mondays, 5mg  daily for the rest of week on 12/04/2019  INTERVAL HISTORY: *** [name] is here for a follow up of ITP. She presents to the clinic alone.    REVIEW OF SYSTEMS:  *** Constitutional: Denies fevers, chills or abnormal weight loss Eyes: Denies blurriness of vision Ears, nose, mouth, throat, and face: Denies mucositis or sore throat Respiratory: Denies cough, dyspnea or wheezes Cardiovascular: Denies palpitation, chest discomfort or lower extremity swelling Gastrointestinal:  Denies nausea, heartburn or change in bowel habits Skin: Denies abnormal skin rashes Lymphatics: Denies new lymphadenopathy or easy bruising Neurological:Denies numbness, tingling or new weaknesses Behavioral/Psych: Mood is stable, no new changes  All other systems were reviewed with the  patient and are negative.  MEDICAL HISTORY:  Past Medical History:  Diagnosis Date  . Anemia   . Chronic ITP (idiopathic thrombocytopenia) (HCC)   . Depression   . Headache   . Lumbar radiculopathy, right 11/13/2019  . 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 BREAST SURGERY  biopsy  . LOWER EXTREMITY VENOGRAPHY Left 10/08/2019   Procedure: LOWER EXTREMITY VENOGRAPHY;  Surgeon: 10/07/2015, MD;  Location: [facility] INVASIVE CV LAB;  Service: Cardiovascular;  Laterality: Left;  . PERIPHERAL VASCULAR INTERVENTION Left 10/08/2019   Procedure: PERIPHERAL VASCULAR INTERVENTION;  Surgeon: Cephus Shelling, MD;  Location: [facility] INVASIVE CV LAB;  Service: Cardiovascular;  Laterality: Left;  LT COMMON ILIAC VEIN  . PERIPHERAL VASCULAR THROMBECTOMY N/A 10/08/2019   Procedure: PERIPHERAL VASCULAR THROMBECTOMY;  Surgeon: [facility], MD;  Location: [facility] INVASIVE CV LAB;  Service: Cardiovascular;  Laterality: N/A;  . SPLENECTOMY, TOTAL N/A 09/16/2015   Procedure: OPEN SPLENECTOMY;  Surgeon: Cephus Shelling III, MD;  Location: [facility] OR;  Service: General;  Laterality: N/A;    I have reviewed the social history and family history with the patient and they are unchanged from previous note.  ALLERGIES:  has No Known Allergies.  MEDICATIONS:  Current Outpatient Medications  Medication Sig Dispense Refill  . ferrous sulfate 324 MG TBEC Take 324 mg by mouth daily with breakfast.    . gabapentin (NEURONTIN) 300 MG capsule Take 1 capsule (300 mg total) by mouth 2 (two) times daily. 60 capsule 3  . pantoprazole (PROTONIX) 40 MG tablet Take 1 tablet (40 mg total) by mouth daily. 30 tablet 0  . warfarin (COUMADIN) 5 MG tablet 7.5mg  daily 45 tablet 3   No current facility-administered medications for this  visit.    PHYSICAL EXAMINATION: ECOG PERFORMANCE STATUS: {CHL ONC ECOG  PS:[phone]}  There were no vitals filed for this visit. There were no vitals filed for this visit. *** GENERAL:alert, no distress and comfortable SKIN: skin color, texture, turgor are normal, no rashes or significant lesions EYES: normal, Conjunctiva are pink and non-injected, sclera clear {OROPHARYNX:no exudate, no erythema and lips, buccal mucosa, and tongue normal}  NECK: supple, thyroid normal size, non-tender, without nodularity LYMPH:  no palpable lymphadenopathy in the cervical, axillary {or inguinal} LUNGS: clear to auscultation and percussion with normal breathing effort HEART: regular rate & rhythm and no murmurs and no lower extremity edema ABDOMEN:abdomen soft, non-tender and normal bowel sounds Musculoskeletal:no cyanosis of digits and no clubbing  NEURO: alert & oriented x 3 with fluent speech, no focal motor/sensory deficits  LABORATORY DATA:  I have reviewed the data as listed CBC Latest Ref Rng & Units 03/15/2020 02/16/2020 01/15/2020  WBC 4.0 - 10.5 K/uL 10.5 10.7(H) 9.1  Hemoglobin 12.0 - 15.0 g/dL 73.2 20.2 11.6(L)  Hematocrit 36.0 - 46.0 % 38.7 40.4 36.4  Platelets 150 - 400 K/uL 410(H) 200 411(H)     CMP Latest Ref Rng & Units 03/15/2020 02/16/2020 01/15/2020  Glucose 70 - 99 mg/dL 542(H) 062(B) 762(G)  BUN 6 - 20 mg/dL 12 11 13   Creatinine 0.44 - 1.00 mg/dL 3.15 1.76  Sodium 135 - 145 mmol/L 136 138 139  Potassium 3.5 - 5.1 mmol/L 4.1 3.9 3.4(L)  Chloride 98 - 111 mmol/L 103 105 107  CO2 22 - 32 mmol/L 21(L) 23 25  Calcium 8.9 - 10.3 mg/dL 9.5 9.4 9.0  Total Protein 6.5 - 8.1 g/dL 7.5 7.0 6.6  Total Bilirubin 0.3 - 1.2 mg/dL 1.60) 0.3 <7.3(X)  Alkaline Phos 38 - 126 U/L 74 64 63  AST 15 - 41 U/L 15 14(L) 16  ALT 0 - 44 U/L 22 16 18       RADIOGRAPHIC STUDIES: I have personally reviewed the radiological images as listed and agreed with the findings in the report. No results found.   ASSESSMENT & PLAN:  [name] is a 45  y.o. female with   1. RecurrentrefractoryITP -She has a history of recurrentand refractoryITP. She is s/p splenectomy and treated withsteroid, IVIG,NPlate,Rituxanand chemoin the past (the first episode) dueto refractory disease.  -She has had repeated ITP Flares with occurrences in 02/2018, 08/04/19, 08/2019 and 09/2019. 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  -Due to her frequent recurrenceof ITP,sherecently completed 4 doses ofRituxan 10/16/19-11/06/19. -We have heldNplatesince 7/26/21due to thrombosis, may consider if plt drops <5K -Will periodicallyuse of steroids (decadron) and IVIG for acute flare  -She received weekly rituximab 4 times in July and August 2021, responded very well,.  It has been normal since then. 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 -She had submassive bilateral PE on 7/26/21CTA during her hospitalization for ITP flare. She was Discharged on 8/2/21on Coumadin 7.5 mg daily.Dose adjusted as indicated. She is currently onCoumadin 5 mg daily except 7.5 mg on Mondays  As of 12/04/19 -INR 2.2 today, therapeutic.  She has mild gum bleeding.  3.History of small foci ofsubarachnoid hemorrhage,Secondary to #1 -recovered well overall.Will continue to monitor. -Multiple head CTs have been stable lately  4.Headaches, Chronic dizziness, Hypotension -ChronicHA, since 03/2019 hospitalization. She has been seen Dr. 12/06/19 who felt symptoms  were consistent with occipital neuralgia.She will continue to f/u with Dr Barbaraann Cao  -She is on Meclizine for dizziness.by   5. COVID19 (+) in early 04/2019, treated monoclonial antibodies. S/p COVID vaccine series.  6. Financial Support -She pays out of pocket but submits her bills and receives pay  assistance. -Her application for Medicaid was denied. She does not have orange card currently.She was approved for drug assistance grant. Blake Divine will f/u with her.  7. Depression -Has tried citalopram and sertraline in the past.She is no longer on Cymbaltadue to worsened depression.I started her onCelexa (11/05/19). Mood stable.   8. Leg weakness, Falls -Likely related to intermittent steroid use.I previously referred her to PT.  -Improved   PLAN: -Lab reviewed, platelet 411, INR 2.2 -Continue same Coumadin dose 5 mg daily except 7.5 mg on Monday -Follow-up in 3 months -I encouraged her to call medicine clinic to reestablish care with them   No problem-specific Assessment & Plan notes found for this encounter.   No orders of the defined types were placed in this encounter.  All questions were answered. The patient knows to call the clinic with any problems, questions or concerns. No barriers to learning was detected. The total time spent in the appointment was {CHL ONC TIME VISIT - KPTWS:[id number]}.     Delphina Cahill 04/16/2020   Rogelia Rohrer, am acting as scribe for Malachy Mood, MD.   {Add scribe attestation statement}

## 2020-04-19 ENCOUNTER — Ambulatory Visit: Payer: No Typology Code available for payment source | Admitting: Hematology

## 2020-04-19 ENCOUNTER — Other Ambulatory Visit: Payer: No Typology Code available for payment source

## 2020-04-19 DIAGNOSIS — D693 Immune thrombocytopenic purpura: Secondary | ICD-10-CM

## 2020-04-19 NOTE — Progress Notes (Incomplete)
[facility]   Telephone:(336) [phone] Fax:(336) [phone]   Clinic Follow up Note   Patient Care Team: Malachy Mood, MD as PCP - General (Hematology) Levert Feinstein, MD as Consulting Physician (Oncology) [name] (Hematology and Oncology) Elwin Mocha, MD as Consulting Physician (Ophthalmology) Justice Deeds, MD (Internal Medicine)  Date of Service:  04/19/2020  CHIEF COMPLAINT: F/u ofrefractoryITP,acutesubmassive bilateralPE, left DVT  CURRENT THERAPY:  -Promacta 25mg  daily starting 10/22/18. Stopped 04/08/18 due to recurrent flares.  -Nplateq2weeks starting in 2 weeks. Increased to weekly on 08/11/19 due to recurrent ITP flares.Willgive her 2 mcg/kg if platelet normal, and increase to 3 mcg/kg if platelet less than 150K. Increased to 37mcg/kg starting 09/03/19.Increased to 28mcg/kg on 09/10/19 and increase again to 73mcg/kg starting 09/17/19.Due toDVT and PE, holdingNplate since 7/26/21due to DVTunless plt <5K -Periodic use of steroids (decadron) and IVIG  -Rituxan weekly X4,completed 11/03/2019 -Coumadin starting 10/15/19, changed to 7.5mg  on Mondays, 5mg  daily for the rest of week on 12/04/2019  INTERVAL HISTORY: *** [name] is here for a follow up of ITP. She presents to the clinic alone.    REVIEW OF SYSTEMS:  *** Constitutional: Denies fevers, chills or abnormal weight loss Eyes: Denies blurriness of vision Ears, nose, mouth, throat, and face: Denies mucositis or sore throat Respiratory: Denies cough, dyspnea or wheezes Cardiovascular: Denies palpitation, chest discomfort or lower extremity swelling Gastrointestinal:  Denies nausea, heartburn or change in bowel habits Skin: Denies abnormal skin rashes Lymphatics: Denies new lymphadenopathy or easy bruising Neurological:Denies numbness, tingling or new weaknesses Behavioral/Psych: Mood is stable, no new changes  All other systems were reviewed with the patient and are  negative.  MEDICAL HISTORY:  Past Medical History:  Diagnosis Date  . Anemia   . Chronic ITP (idiopathic thrombocytopenia) (HCC)   . Depression   . Headache   . Lumbar radiculopathy, right 11/13/2019  . 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 BREAST SURGERY  biopsy  . LOWER EXTREMITY VENOGRAPHY Left 10/08/2019   Procedure: LOWER EXTREMITY VENOGRAPHY;  Surgeon: 10/07/2015, MD;  Location: [facility] INVASIVE CV LAB;  Service: Cardiovascular;  Laterality: Left;  . PERIPHERAL VASCULAR INTERVENTION Left 10/08/2019   Procedure: PERIPHERAL VASCULAR INTERVENTION;  Surgeon: Cephus Shelling, MD;  Location: [facility] INVASIVE CV LAB;  Service: Cardiovascular;  Laterality: Left;  LT COMMON ILIAC VEIN  . PERIPHERAL VASCULAR THROMBECTOMY N/A 10/08/2019   Procedure: PERIPHERAL VASCULAR THROMBECTOMY;  Surgeon: [facility], MD;  Location: [facility] INVASIVE CV LAB;  Service: Cardiovascular;  Laterality: N/A;  . SPLENECTOMY, TOTAL N/A 09/16/2015   Procedure: OPEN SPLENECTOMY;  Surgeon: Cephus Shelling III, MD;  Location: [facility] OR;  Service: General;  Laterality: N/A;    I have reviewed the social history and family history with the patient and they are unchanged from previous note.  ALLERGIES:  has No Known Allergies.  MEDICATIONS:  Current Outpatient Medications  Medication Sig Dispense Refill  . ferrous sulfate 324 MG TBEC Take 324 mg by mouth daily with breakfast.    . gabapentin (NEURONTIN) 300 MG capsule Take 1 capsule (300 mg total) by mouth 2 (two) times daily. 60 capsule 3  . pantoprazole (PROTONIX) 40 MG tablet Take 1 tablet (40 mg total) by mouth daily. 30 tablet 0  . warfarin (COUMADIN) 5 MG tablet 7.5mg  daily 45 tablet 3   No current facility-administered medications for this  visit.    PHYSICAL EXAMINATION: ECOG PERFORMANCE STATUS: {CHL ONC ECOG [phone]}  There were  no vitals filed for this visit. There were no vitals filed for this visit. *** GENERAL:alert, no distress and comfortable SKIN: skin color, texture, turgor are normal, no rashes or significant lesions EYES: normal, Conjunctiva are pink and non-injected, sclera clear {OROPHARYNX:no exudate, no erythema and lips, buccal mucosa, and tongue normal}  NECK: supple, thyroid normal size, non-tender, without nodularity LYMPH:  no palpable lymphadenopathy in the cervical, axillary {or inguinal} LUNGS: clear to auscultation and percussion with normal breathing effort HEART: regular rate & rhythm and no murmurs and no lower extremity edema ABDOMEN:abdomen soft, non-tender and normal bowel sounds Musculoskeletal:no cyanosis of digits and no clubbing  NEURO: alert & oriented x 3 with fluent speech, no focal motor/sensory deficits  LABORATORY DATA:  I have reviewed the data as listed CBC Latest Ref Rng & Units 03/15/2020 02/16/2020 01/15/2020  WBC 4.0 - 10.5 K/uL 10.5 10.7(H) 9.1  Hemoglobin 12.0 - 15.0 g/dL 27.0 35.0 11.6(L)  Hematocrit 36.0 - 46.0 % 38.7 40.4 36.4  Platelets 150 - 400 K/uL 410(H) 200 411(H)     CMP Latest Ref Rng & Units 03/15/2020 02/16/2020 01/15/2020  Glucose 70 - 99 mg/dL 093(G) 182(X) 937(J)  BUN 6 - 20 mg/dL 12 11 13   Creatinine 0.44 - 1.00 mg/dL 6.96 7.89  Sodium 135 - 145 mmol/L 136 138 139  Potassium 3.5 - 5.1 mmol/L 4.1 3.9 3.4(L)  Chloride 98 - 111 mmol/L 103 105 107  CO2 22 - 32 mmol/L 21(L) 23 25  Calcium 8.9 - 10.3 mg/dL 9.5 9.4 9.0  Total Protein 6.5 - 8.1 g/dL 7.5 7.0 6.6  Total Bilirubin 0.3 - 1.2 mg/dL 3.81) 0.3 <0.1(B)  Alkaline Phos 38 - 126 U/L 74 64 63  AST 15 - 41 U/L 15 14(L) 16  ALT 0 - 44 U/L 22 16 18       RADIOGRAPHIC STUDIES: I have personally reviewed the radiological images as listed and agreed with the findings in the report. No results found.   ASSESSMENT & PLAN:  [name] is a 45 y.o. female with   1.  RecurrentrefractoryITP -She has a history of recurrentand refractoryITP. She is s/p splenectomy and treated withsteroid, IVIG,NPlate,Rituxanand chemoin the past (the first episode) dueto refractory disease.  -She has had repeated ITP Flares with occurrences in 02/2018, 08/04/19, 08/2019 and 09/2019. 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  -Due to her frequent recurrenceof ITP,sherecently completed 4 doses ofRituxan 10/16/19-11/06/19. -We have heldNplatesince 7/26/21due to thrombosis, may consider if plt drops <5K -Will periodicallyuse of steroids (decadron) and IVIG for acute flare  -She received weekly rituximab 4 times in July and August 2021, responded very well,.  It has been normal since then. 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 -She had submassive bilateral PE on 7/26/21CTA during her hospitalization for ITP flare. She was Discharged on 8/2/21on Coumadin 7.5 mg daily.Dose adjusted as indicated. She is currently onCoumadin 5 mg daily except 7.5 mg on Mondays  As of 12/04/19 -INR 2.2 today, therapeutic.  She has mild gum bleeding.  3.History of small foci ofsubarachnoid hemorrhage,Secondary to #1 -recovered well overall.Will continue to monitor. -Multiple head CTs have been stable lately  4.Headaches, Chronic dizziness, Hypotension -ChronicHA, since 03/2019 hospitalization. She has been seen Dr. 12/06/19 who felt symptoms  were consistent with occipital neuralgia.She will continue to f/u with Dr Barbaraann Cao  -She is on Meclizine for dizziness.by   5. COVID19 (+) in early 04/2019, treated monoclonial antibodies. S/p COVID vaccine series.  6. Financial Support -She pays out of pocket but submits her bills and receives pay assistance. -Her application for  Medicaid was denied. She does not have orange card currently.She was approved for drug assistance grant. Blake Divine will f/u with her.  7. Depression -Has tried citalopram and sertraline in the past.She is no longer on Cymbaltadue to worsened depression.I started her onCelexa (11/05/19). Mood stable.   8. Leg weakness, Falls -Likely related to intermittent steroid use.I previously referred her to PT.  -Improved   PLAN: -Lab reviewed, platelet 411, INR 2.2 -Continue same Coumadin dose 5 mg daily except 7.5 mg on Monday -Follow-up in 3 months -I encouraged her to call medicine clinic to reestablish care with them   No problem-specific Assessment & Plan notes found for this encounter.   No orders of the defined types were placed in this encounter.  All questions were answered. The patient knows to call the clinic with any problems, questions or concerns. No barriers to learning was detected. The total time spent in the appointment was {CHL ONC TIME VISIT - GQQPY:[id number]}.     Delphina Cahill 04/19/2020   Rogelia Rohrer, am acting as scribe for Malachy Mood, MD.   {Add scribe attestation statement}

## 2020-04-21 ENCOUNTER — Other Ambulatory Visit: Payer: No Typology Code available for payment source

## 2020-04-21 ENCOUNTER — Ambulatory Visit: Payer: No Typology Code available for payment source | Admitting: Hematology

## 2020-04-23 ENCOUNTER — Other Ambulatory Visit: Payer: No Typology Code available for payment source

## 2020-04-23 ENCOUNTER — Ambulatory Visit: Payer: No Typology Code available for payment source | Admitting: Hematology

## 2020-04-26 NOTE — Progress Notes (Signed)
[facility]   Telephone:(336) [phone] Fax:(336) [phone]   Clinic Follow up Note   Patient Care Team: Malachy Mood, MD as PCP - General (Hematology) Levert Feinstein, MD as Consulting Physician (Oncology) [name] (Hematology and Oncology) Elwin Mocha, MD as Consulting Physician (Ophthalmology) Justice Deeds, MD (Internal Medicine)  Date of Service:  04/29/2020   CHIEF COMPLAINT: F/u ofrefractoryITP,acutesubmassive bilateralPE, left DVT  PREVIOUS ANDCURRENT THERAPY: -Promacta 25mg  daily starting 10/22/18. Stopped 04/08/18 due to recurrent flares.  -Nplateq2weeks starting in 2 weeks. Increased to weekly on 08/11/19 due to recurrent ITP flares.Willgive her 2 mcg/kg if platelet normal, and increase to 3 mcg/kg if platelet less than 150K. Increased to 55mcg/kg starting 09/03/19.Increased to 51mcg/kg on 09/10/19 and increase again to 63mcg/kg starting 09/17/19.Due toDVT and PE, holdingNplate since 7/26/21due to DVTunless plt <5K -Periodic use of steroids (decadron) and IVIG  -Rituxan weekly X4,completed 11/03/2019 -Coumadin starting 10/15/19, changed to 7.5mg  on Mondays, 5mg  daily for the rest of week on 12/04/2019  INTERVAL HISTORY:  [name] is here for a follow up. She presents to the clinic alone. She is medically stable, still has mild intermittent dizziness, no headaches or pain.  She has stopped gabapentin, her right thigh has resolved.  She was found to be hypotensive when she checked in, she did not eat or drink anything this morning. No bleeding.  Last menstrual period was 2 months ago.    REVIEW OF SYSTEMS:   Constitutional: Denies fevers, chills or abnormal weight loss Eyes: Denies blurriness of vision Ears, nose, mouth, throat, and face: Denies mucositis or sore throat Respiratory: Denies cough, dyspnea or wheezes Cardiovascular: Denies palpitation, chest discomfort or lower extremity swelling Gastrointestinal:  Denies  nausea, heartburn or change in bowel habits Skin: Denies abnormal skin rashes Lymphatics: Denies new lymphadenopathy or easy bruising Neurological:Denies numbness, tingling or new weaknesses Behavioral/Psych: Mood is stable, no new changes  All other systems were reviewed with the patient and are negative.  MEDICAL HISTORY:  Past Medical History:  Diagnosis Date  . Anemia   . Chronic ITP (idiopathic thrombocytopenia) (HCC)   . Depression   . Headache   . Lumbar radiculopathy, right 11/13/2019  . 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 BREAST SURGERY  biopsy  . LOWER EXTREMITY VENOGRAPHY Left 10/08/2019   Procedure: LOWER EXTREMITY VENOGRAPHY;  Surgeon: 10/07/2015, MD;  Location: [facility] INVASIVE CV LAB;  Service: Cardiovascular;  Laterality: Left;  . PERIPHERAL VASCULAR INTERVENTION Left 10/08/2019   Procedure: PERIPHERAL VASCULAR INTERVENTION;  Surgeon: Cephus Shelling, MD;  Location: [facility] INVASIVE CV LAB;  Service: Cardiovascular;  Laterality: Left;  LT COMMON ILIAC VEIN  . PERIPHERAL VASCULAR THROMBECTOMY N/A 10/08/2019   Procedure: PERIPHERAL VASCULAR THROMBECTOMY;  Surgeon: [facility], MD;  Location: [facility] INVASIVE CV LAB;  Service: Cardiovascular;  Laterality: N/A;  . SPLENECTOMY, TOTAL N/A 09/16/2015   Procedure: OPEN SPLENECTOMY;  Surgeon: Cephus Shelling III, MD;  Location: [facility] OR;  Service: General;  Laterality: N/A;    I have reviewed the social history and family history with the patient and they are unchanged from previous note.  ALLERGIES:  has No Known Allergies.  MEDICATIONS:  Current Outpatient Medications  Medication Sig Dispense Refill  . ferrous sulfate 324 MG TBEC Take 324 mg by mouth daily with breakfast.    . pantoprazole (PROTONIX) 40 MG tablet Take 1  tablet (40 mg total) by mouth daily. 30 tablet 0  . warfarin (COUMADIN) 5 MG  tablet 5mg  daily on Mondays and Thursdays, 7.5mg  daily for the rest of week 45 tablet 1   Current Facility-Administered Medications  Medication Dose Route Frequency Provider Last Rate Last Admin  . 0.9 %  sodium chloride infusion   Intravenous Continuous 08-20-1979, MD        PHYSICAL EXAMINATION: ECOG PERFORMANCE STATUS: 1 - Symptomatic but completely ambulatory  Vitals:   04/29/20 0835  BP: (!) 87/45  Pulse: 66  Resp: 18  Temp: 97.9 F (36.6 C)  SpO2: 100%   Filed Weights   04/29/20 0835  Weight: 245 lb 3.2 oz (111.2 kg)   I check her BP in right upper arm with manual cuff and it was 80/65  GENERAL:alert, obese lady, no distress and comfortable SKIN: skin color, texture, turgor are normal, no rashes or significant lesions EYES: normal, Conjunctiva are pink and non-injected, sclera clear Musculoskeletal:no cyanosis of digits and no clubbing  NEURO: alert & oriented x 3 with fluent speech, no focal motor/sensory deficits  LABORATORY DATA:  I have reviewed the data as listed CBC Latest Ref Rng & Units 04/29/2020 03/15/2020 02/16/2020  WBC 4.0 - 10.5 K/uL 9.7 10.5 10.7(H)  Hemoglobin 12.0 - 15.0 g/dL 02/18/2020 72.5 36.6  Hematocrit 36.0 - 46.0 % 37.2 38.7 40.4  Platelets 150 - 400 K/uL 283 410(H) 200     CMP Latest Ref Rng & Units 04/29/2020 03/15/2020 02/16/2020  Glucose 70 - 99 mg/dL 98 02/18/2020) 347(Q)  BUN 6 - 20 mg/dL 14 12 11   Creatinine 0.44 - 1.00 mg/dL 259(D 6.38  Sodium 135 - 145 mmol/L 141 136 138  Potassium 3.5 - 5.1 mmol/L 3.6 4.1 3.9  Chloride 98 - 111 mmol/L 108 103 105  CO2 22 - 32 mmol/L 25 21(L) 23  Calcium 8.9 - 10.3 mg/dL 9.1 9.5 9.4  Total Protein 6.5 - 8.1 g/dL 6.7 7.5 7.0  Total Bilirubin 0.3 - 1.2 mg/dL 0.4 7.56) 0.3  Alkaline Phos 38 - 126 U/L 58 74 64  AST 15 - 41 U/L 21 15 14(L)  ALT 0 - 44 U/L 23 22 16       RADIOGRAPHIC STUDIES: I have personally reviewed the radiological images as listed and agreed with the findings in the report. No  results found.   ASSESSMENT & PLAN:  [name] is a 45 y.o. female with    1. RecurrentrefractoryITP -She has a history of recurrentand refractoryITP. She is s/p splenectomy and treated withsteroid, IVIG,NPlate,Rituxanand chemoin the past (the first episode) dueto refractory disease.  -She has had repeated ITP Flares with occurrences in 02/2018, 08/04/19, 08/2019 and 09/2019. 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  -Due to her frequent recurrenceof ITP,sherecently completed 4 doses ofRituxan 10/16/19-11/06/19. -We have heldNplatesince 7/26/21due to thrombosis, may consider if plt drops <5K -Will periodicallyuse of steroids (decadron) and IVIG for acute flare  -She received weekly rituximab 4 times in July and August 2021, responded very well.  It has been normal since then. 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 -She had submassive bilateral PE on 7/26/21CTA during her hospitalization for ITP flare. She was Discharged on 8/2/21on Coumadin 7.5 mg daily.Dose adjusted as indicated. She is currently onCoumadin 5 mg  daily except 7.5 mg on Mondays  As of 12/04/19 -INR 3.1 today and last time, will decrease coumadin to 5mg  daily on Mondays and Thursdays and 7.5mg  daily for rest of week   3.History of small foci ofsubarachnoid hemorrhage,Secondary to #1 -recovered well overall.Will continue to monitor. -Multiple head CTs have been stable lately  4.Headaches, Chronic dizziness, Hypotension -ChronicHA, since 03/2019 hospitalization. She has been seen Dr. 04/2019 who felt symptoms were consistent with occipital neuralgia. -Headache resolved -she still has intermittent dizziness, blood pressure was low  this morning, she did not eat or drink anything before she came in this morning -We will give 1 L normal saline  5. COVID19 (+) in early 04/2019, treated monoclonial antibodies. S/p COVID vaccine series.  6. Financial Support -She pays out of pocket but submits her bills and receives pay assistance. -Her application for Medicaid was denied. She does not have orange card currently.She was approved for drug assistance grant. 05/2019 will f/u with her.  7. Depression -Has tried citalopram and sertraline in the past.She is no longer on Cymbaltadue to worsened depression.I started her onCelexa (11/05/19). Mood stable.   8. Leg weakness, Falls -Likely related to intermittent steroid use.I previously referred her to PT.  -Improved   PLAN: -Lab reviewed CBC normal, INR 3.1 -will decrease coumadin to 5mg  daily on Mondays and Thursdays and 7.5mg  daily for rest of week  -lab in 2, 4 and 8 weeks -f/u in 8 weeks -NS 1L over 1hr in my clinic, BP normalized to 100/62, she was discharged home   No problem-specific Assessment & Plan notes found for this encounter.   No orders of the defined types were placed in this encounter.  All questions were answered. The patient knows to call the clinic with any problems, questions or concerns. No barriers to learning was detected. The total time spent in the appointment was 30 minutes.     06-16-1974, MD 04/29/2020   I, Malachy Mood, am acting as scribe for 06/27/2020, MD.   I have reviewed the above documentation for accuracy and completeness, and I agree with the above.

## 2020-04-29 ENCOUNTER — Telehealth: Payer: Self-pay | Admitting: Hematology

## 2020-04-29 ENCOUNTER — Inpatient Hospital Stay: Payer: No Typology Code available for payment source | Attending: Nurse Practitioner

## 2020-04-29 ENCOUNTER — Encounter: Payer: Self-pay | Admitting: Hematology

## 2020-04-29 ENCOUNTER — Other Ambulatory Visit: Payer: Self-pay

## 2020-04-29 ENCOUNTER — Inpatient Hospital Stay (HOSPITAL_BASED_OUTPATIENT_CLINIC_OR_DEPARTMENT_OTHER): Payer: No Typology Code available for payment source | Admitting: Hematology

## 2020-04-29 ENCOUNTER — Inpatient Hospital Stay: Payer: Self-pay

## 2020-04-29 VITALS — BP 87/45 | HR 66 | Temp 97.9°F | Resp 18 | Ht <= 58 in | Wt 245.2 lb

## 2020-04-29 DIAGNOSIS — R531 Weakness: Secondary | ICD-10-CM | POA: Insufficient documentation

## 2020-04-29 DIAGNOSIS — Z7901 Long term (current) use of anticoagulants: Secondary | ICD-10-CM | POA: Insufficient documentation

## 2020-04-29 DIAGNOSIS — R296 Repeated falls: Secondary | ICD-10-CM | POA: Insufficient documentation

## 2020-04-29 DIAGNOSIS — Z79899 Other long term (current) drug therapy: Secondary | ICD-10-CM | POA: Insufficient documentation

## 2020-04-29 DIAGNOSIS — I82402 Acute embolism and thrombosis of unspecified deep veins of left lower extremity: Secondary | ICD-10-CM

## 2020-04-29 DIAGNOSIS — D693 Immune thrombocytopenic purpura: Secondary | ICD-10-CM

## 2020-04-29 DIAGNOSIS — Z8616 Personal history of COVID-19: Secondary | ICD-10-CM | POA: Insufficient documentation

## 2020-04-29 DIAGNOSIS — D500 Iron deficiency anemia secondary to blood loss (chronic): Secondary | ICD-10-CM

## 2020-04-29 DIAGNOSIS — Z9081 Acquired absence of spleen: Secondary | ICD-10-CM | POA: Insufficient documentation

## 2020-04-29 DIAGNOSIS — F32A Depression, unspecified: Secondary | ICD-10-CM | POA: Insufficient documentation

## 2020-04-29 DIAGNOSIS — Z86711 Personal history of pulmonary embolism: Secondary | ICD-10-CM | POA: Insufficient documentation

## 2020-04-29 DIAGNOSIS — R42 Dizziness and giddiness: Secondary | ICD-10-CM | POA: Insufficient documentation

## 2020-04-29 DIAGNOSIS — Z5181 Encounter for therapeutic drug level monitoring: Secondary | ICD-10-CM

## 2020-04-29 DIAGNOSIS — Z86718 Personal history of other venous thrombosis and embolism: Secondary | ICD-10-CM | POA: Insufficient documentation

## 2020-04-29 LAB — CMP (CANCER CENTER ONLY)
ALT: 23 U/L (ref 0–44)
AST: 21 U/L (ref 15–41)
Albumin: 3.8 g/dL (ref 3.5–5.0)
Alkaline Phosphatase: 58 U/L (ref 38–126)
Anion gap: 8 (ref 5–15)
BUN: 14 mg/dL (ref 6–20)
CO2: 25 mmol/L (ref 22–32)
Calcium: 9.1 mg/dL (ref 8.9–10.3)
Chloride: 108 mmol/L (ref 98–111)
Creatinine: 0.63 mg/dL (ref 0.44–1.00)
GFR, Estimated: >60 mL/min (ref >60)
Glucose, Bld: 98 mg/dL (ref 70–99)
Potassium: 3.6 mmol/L (ref 3.5–5.1)
Sodium: 141 mmol/L (ref 135–145)
Total Bilirubin: 0.4 mg/dL (ref 0.3–1.2)
Total Protein: 6.7 g/dL (ref 6.5–8.1)

## 2020-04-29 LAB — PROTIME-INR
INR: 3.1 — ABNORMAL HIGH (ref 0.8–1.2)
Prothrombin Time: 31 seconds — ABNORMAL HIGH (ref 11.4–15.2)

## 2020-04-29 LAB — IRON AND TIBC
Iron: 55 ug/dL (ref 41–142)
Saturation Ratios: 14 % — ABNORMAL LOW (ref 21–57)
TIBC: 403 ug/dL (ref 236–444)
UIBC: 347 ug/dL (ref 120–384)

## 2020-04-29 LAB — CBC WITH DIFFERENTIAL (CANCER CENTER ONLY)
Abs Immature Granulocytes: 0.02 10*3/uL (ref 0.00–0.07)
Basophils Absolute: 0.1 10*3/uL (ref 0.0–0.1)
Basophils Relative: 1 %
Eosinophils Absolute: 0.2 10*3/uL (ref 0.0–0.5)
Eosinophils Relative: 2 %
HCT: 37.2 % (ref 36.0–46.0)
Hemoglobin: 12.1 g/dL (ref 12.0–15.0)
Immature Granulocytes: 0 %
Lymphocytes Relative: 26 %
Lymphs Abs: 2.5 10*3/uL (ref 0.7–4.0)
MCH: 28.1 pg (ref 26.0–34.0)
MCHC: 32.5 g/dL (ref 30.0–36.0)
MCV: 86.5 fL (ref 80.0–100.0)
Monocytes Absolute: 0.8 10*3/uL (ref 0.1–1.0)
Monocytes Relative: 8 %
Neutro Abs: 6.1 10*3/uL (ref 1.7–7.7)
Neutrophils Relative %: 63 %
Platelet Count: 283 10*3/uL (ref 150–400)
RBC: 4.3 MIL/uL (ref 3.87–5.11)
RDW: 15.9 % — ABNORMAL HIGH (ref 11.5–15.5)
WBC Count: 9.7 10*3/uL (ref 4.0–10.5)
nRBC: 0 % (ref 0.0–0.2)

## 2020-04-29 LAB — FERRITIN: Ferritin: 32 ng/mL (ref 11–307)

## 2020-04-29 MED ORDER — SODIUM CHLORIDE 0.9 % IV SOLN
INTRAVENOUS | Status: DC
  Filled 2020-04-29 (×2): qty 250

## 2020-04-29 MED ORDER — WARFARIN SODIUM 5 MG PO TABS: ORAL_TABLET | ORAL | 1 refills | Status: DC

## 2020-04-29 NOTE — Telephone Encounter (Signed)
Left message Jamelle Haring SW#[id number]) with follow-up appointments per 2/10 los. Gave option to call back to reschedule if needed.

## 2020-04-29 NOTE — Patient Instructions (Signed)
Hipotensin Hypotension Cuando el corazn late, bombea la sangre a travs del cuerpo. La fuerza que origina es la presin arterial. Si sufre hipotensin, la presin arterial es baja. Cuando la presin arterial es demasiado baja, es posible que no llegue sangre suficiente al cerebro o a otras partes del cuerpo. Eso puede causar que se sienta dbil o mareado, que tenga latidos cardacos rpidos o incluso que pierda el conocimiento (se desmaye). La presin arterial baja puede ser inofensiva o puede causar problemas graves. Cules son las causas?  Prdida de sangre.  No tener suficiente agua en el cuerpo (deshidratacin).  Problemas cardacos.  Problemas hormonales.  Embarazo.  Una infeccin grave.  No tener la cantidad suficiente de ciertos nutrientes.  Reacciones alrgicas muy graves.  Ciertos medicamentos. Qu incrementa el riesgo?  Edad. El riesgo aumenta a medida que avanza la edad.  Afecciones del corazn o el cerebro y la mdula espinal (el sistema nervioso central).  Tomar ciertos medicamentos.  Estar embarazada. Cules son los signos o los sntomas?  Sentir: ? Debilidad. ? Desvanecimiento. ? Mareos. ? Cansancio (fatiga).  Visin borrosa.  Latidos cardacos acelerados.  Desmayo, en casos muy graves. Cmo se trata?  Cambios en la dieta. Estos cambios implican comer con ms sal (sodio) o tomar ms agua.  Medicamentos para elevar la presin arterial.  Cambiar la cantidad que toma (la dosis) de algunos de sus medicamentos.  Usar medias de compresin. Estas medias ayudan a Transport planner formacin de cogulos de [place] y a reducir la hinchazn de las piernas. En algunos casos, puede ser necesario ir al hospital para:  Reposicin de lquidos. Esto implica que continuar recibiendo lquidos por una va intravenosa.  Reposicin de sangre. Este es un procedimiento en el cual recibir sangre de un donante a travs de una va intravenosa (transfusin).  El  tratamiento de una infeccin o problemas cardacos, si corresponde.  Control. Necesitar supervisin [employer] de los medicamentos que est tomando. Siga estas indicaciones en su casa: Comida y bebida  Beba suficiente lquido para mantener el pis (la [place]) de color amarillo plido.  Siga una dieta saludable. Siga las indicaciones del mdico respecto de lo que puede comer o beber. Una dieta saludable incluye: ? Nils Pyle y verduras frescas. ? Cereales integrales. ? Carnes bajas en grasa Nonah Mattes). ? Productos lcteos descremados.  Consuma ms sal nicamente como se lo hayan indicado. No agregue ms sal a las comidas a menos que el mdico se lo indique.  Consuma pequeas cantidades de comida con ms frecuencia.  No se levante rpido despus de comer.   Medicamentos  [employer] de venta libre y los recetados solamente como se lo haya indicado el mdico. ? Siga las indicaciones del mdico acerca de cambiar la cantidad de medicamentos que toma, si corresponde. ? No deje de tomar los medicamentos ni cambie la dosis por su cuenta. Indicaciones generales  Use medias de compresin como se lo haya indicado el mdico.  Levntese despacio cuando est acostado o sentado.  No tome duchas con agua muy caliente y evite el calor como se lo haya indicado el mdico.  Reanude sus actividades normales segn lo indicado por el mdico. Pregunte qu actividades son seguras para usted.  No consuma ningn producto que contenga nicotina o tabaco, como cigarrillos, cigarrillos electrnicos y tabaco de Theatre manager. Si necesita ayuda para dejar de fumar, consulte al mdico.  Concurra a todas las visitas de control como se lo haya indicado el mdico. Esto es importante.  Comunquese con un mdico si:  Vomita.  Presenta heces lquidas (diarrea).  Tiene fiebre por ms de 2 a 3das.  Tiene ms sed que lo habitual.  Se siente dbil y cansado. Solicite ayuda inmediatamente  si:  Midwife.  Tiene latidos cardacos rpidos o irregulares.  Pierde la sensibilidad (tiene adormecimiento) en cualquier parte del cuerpo.  No puede mover los brazos ni las piernas.  Tiene dificultad para hablar.  Se siente transpirado o mareado.  Se desmaya.  Tiene dificultad para respirar.  Tiene dificultad para mantenerse despierto.  Se siente desorientado (confundido). Resumen  La hipotensin tambin se llama presin arterial baja. Se produce cuando la fuerza de la sangre que bombea en las arterias es demasiado dbil.  La hipotensin puede ser inofensiva o puede causar problemas graves.  El tratamiento puede incluir cambios en la alimentacin y los medicamentos y el uso de medias de compresin.  En algunos casos muy graves, puede ser [employer] ir al hospital. Esta informacin no tiene Theme park manager el consejo del mdico. Asegrese de hacerle al mdico cualquier pregunta que tenga. Document Revised: 10/17/2017 Document Reviewed: 10/17/2017 Elsevier Patient Education  2021 Elsevier Inc.  Rehidratacin en los adultos Rehydration, Adult La rehidratacin es la reposicin de los lquidos, sales y minerales del cuerpo (electrolitos) que se pierden durante la deshidratacin. La deshidratacin se da cuando hay una cantidad insuficiente de agua u otros lquidos en el organismo. Esto ocurre cuando se pierden ms lquidos de los que se ingieren. Entre las causas frecuentes de deshidratacin, se incluyen:  No beber la cantidad suficiente de lquidos. Esto puede suceder cuando se enferma o hace actividades que requieren mucha energa, especialmente cuando hace calor.  Afecciones que [employer] se pierda agua u otros lquidos, [place], vmitos, o sudar u Office manager.  Otras enfermedades, como fiebre o infeccin.  Determinados medicamentos, como aquellos que eliminan el exceso de lquido del cuerpo (diurticos). Los sntomas de la deshidratacin leve o  moderada pueden incluir sed, sequedad de los labios y de la boca, y Research scientist (life sciences). Los sntomas de la deshidratacin grave pueden incluir aumento de la frecuencia cardaca, confusin, desmayos e imposibilidad de Geographical information systems officer. En caso de deshidratacin grave, es posible que deba recibir lquidos por va intravenosa en el hospital. En caso de deshidratacin leve o moderada, generalmente puede rehidratarse en su casa tomando ciertos lquidos que le haya indicado el mdico. Cules son los riesgos? Por lo general, la rehidratacin es segura. Sin embargo, la ingesta excesiva de lquido (hiperhidratacin) puede ser un problema. Esto es poco frecuente. La hiperhidratacin puede causar un desequilibrio de los electrolitos, insuficiencia renal o una disminucin de la concentracin de sal (sodio) en el cuerpo. Materiales necesarios Necesitar una solucin de rehidratacin oral (SRO) si el mdico se lo indica. Se trata de una bebida que es para tratar la deshidratacin. Se puede conseguir en farmacias y tiendas. Cmo rehidratarse Lquidos Siga las instrucciones del mdico con respecto a Social research officer, government. El tipo y la cantidad de lquido que debe beber dependen de su afeccin. En general, debe elegir las bebidas que prefiera.  Si se lo indic el mdico, tome una SRO. ? Para preparar una SRO, siga las instrucciones del envase. ? Comience por beber pequeas cantidades, aproximadamente taza ( ) cada 5 a . ? Aumente lentamente la cantidad que bebe hasta que haya ingerido la cantidad recomendada por el mdico.  Beba suficiente lquidos transparentes como para mantener la orina de color amarillo plido. Si le indicaron que beba Neomia Dear  SRO, termnela primero y [employer] a beber lentamente otros lquidos transparentes. Beba lquidos, por ejemplo: ? [place]. Esto incluye agua con gas y agua saborizada. Si bebe solo agua, puede disminuir mucho la cantidad de sodio en el cuerpo (hiponatremia). Siga el consejo del  mdico. ? Agua de trocitos de hielo que usted succiona. ? Jugo de frutas rebajado con agua (diluido). ? Bebidas deportivas. ? Ts de hierbas calientes o fros. ? Sopas a base de caldo. ? Leche o productos lcteos. Alimentos Siga las instrucciones del mdico respecto de lo que puede comer [place] se Agricultural engineer. El mdico puede recomendarle que comience a comer despacio alimentos habituales en pequeas cantidades.  Consuma los alimentos que contienen un equilibrio saludable de Customer service manager, como las bananas, las [place], las papas, los tomates y Conservation officer, historic buildings.  Evite los alimentos grasos y los muy azucarados. En algunos casos, puede alimentarse a travs de una sonda de alimentacin que se coloca a travs de la nariz hasta el estmago (sonda nasogstrica o sonda NG). Esto puede hacerse si tiene vmitos o diarrea que no pueden controlarse.   Bebidas que se deben evitar Ciertas bebidas pueden empeorar la deshidratacin. Mientras se rehidrata, evite consumir alcohol.   Cmo saber si se est recuperando de la deshidratacin Se puede estar recuperando de la deshidratacin si:  Orina con ms frecuencia que antes de Pensions consultant.  Su orina es de [employer] plido.  Mejora el nivel de [place].  Vomita con menos frecuencia.  Tiene diarrea con menos frecuencia.  El apetito mejora o vuelve a la normalidad.  Se siente menos mareado o disminuye la sensacin de desvanecimiento.  El color y el tono de la piel comienzan a lucir ms normales. Siga estas instrucciones en su casa:  Use los medicamentos de venta libre y los recetados solamente como se lo haya indicado el mdico.  No tome comprimidos de sodio. Hacer esto puede aumentar la concentracin de sodio en el organismo (hipernatremia). Comunquese con un mdico si:  Contina teniendo sntomas de deshidratacin leve o moderada, por ejemplo: ? Sed. ? Labios secos. ? Sequedad leve en la boca. ? Mareos. ? Orina oscura o  menos orina que lo normal. ? Calambres musculares.  Sigue con vmitos o diarrea. Busque ayuda de inmediato si:  Tiene sntomas de deshidratacin que empeoran.  Tiene fiebre.  Tiene un dolor de cabeza intenso.  Est con vmitos y sucede lo siguiente: ? Los vmitos empeoran o no desaparecen. ? El vmito tiene sangre o una sustancia verde (bilis). ? No puede comer ni beber sin vomitar.  Tiene problemas con la miccin o las deposiciones, por ejemplo: ? Diarrea que empeora o que no desaparece. ? Sangre en la materia fecal (heces). Esto puede hacer que la materia fecal sea negra y de aspecto alquitranado. ? No orina u orina solamente una pequea cantidad de color muy oscuro en el trmino de 6 a 8horas.  Tiene dificultad para respirar.  Tiene sntomas que empeoran con [employer]. Estos sntomas pueden representar un problema grave que constituye Radio broadcast assistant. No espere a ver si los sntomas desaparecen. Solicite atencin mdica de inmediato. Comunquese con el servicio de emergencias de su localidad (911 en los [place]). No conduzca por sus propios medios [employer]. Resumen  La rehidratacin es la reposicin de los lquidos y minerales del cuerpo (electrolitos) que se pierden durante la deshidratacin.  Siga las instrucciones del mdico con respecto a Social research officer, government. El tipo y la cantidad de lquido que debe beber  dependen de su afeccin.  Aumente lentamente la cantidad que bebe hasta que haya ingerido la cantidad recomendada por el mdico.  Comunquese con el mdico si sigue presentando signos de deshidratacin leve o moderada. Esta informacin no tiene Theme park manager el consejo del mdico. Asegrese de hacerle al mdico cualquier pregunta que tenga. Document Revised: 06/16/2019 Document Reviewed: 06/16/2019 Elsevier Patient Education  2021 [employer].

## 2020-05-13 ENCOUNTER — Inpatient Hospital Stay: Payer: Self-pay

## 2020-05-13 ENCOUNTER — Other Ambulatory Visit: Payer: Self-pay

## 2020-05-13 DIAGNOSIS — I82402 Acute embolism and thrombosis of unspecified deep veins of left lower extremity: Secondary | ICD-10-CM

## 2020-05-13 DIAGNOSIS — D693 Immune thrombocytopenic purpura: Secondary | ICD-10-CM

## 2020-05-13 DIAGNOSIS — Z5181 Encounter for therapeutic drug level monitoring: Secondary | ICD-10-CM

## 2020-05-13 LAB — CBC WITH DIFFERENTIAL (CANCER CENTER ONLY)
Abs Immature Granulocytes: 0.02 10*3/uL (ref 0.00–0.07)
Basophils Absolute: 0 10*3/uL (ref 0.0–0.1)
Basophils Relative: 1 %
Eosinophils Absolute: 0.2 10*3/uL (ref 0.0–0.5)
Eosinophils Relative: 3 %
HCT: 36.9 % (ref 36.0–46.0)
Hemoglobin: 11.9 g/dL — ABNORMAL LOW (ref 12.0–15.0)
Immature Granulocytes: 0 %
Lymphocytes Relative: 30 %
Lymphs Abs: 2.5 10*3/uL (ref 0.7–4.0)
MCH: 28.7 pg (ref 26.0–34.0)
MCHC: 32.2 g/dL (ref 30.0–36.0)
MCV: 89.1 fL (ref 80.0–100.0)
Monocytes Absolute: 0.7 10*3/uL (ref 0.1–1.0)
Monocytes Relative: 9 %
Neutro Abs: 4.8 10*3/uL (ref 1.7–7.7)
Neutrophils Relative %: 57 %
Platelet Count: 379 10*3/uL (ref 150–400)
RBC: 4.14 MIL/uL (ref 3.87–5.11)
RDW: 15.4 % (ref 11.5–15.5)
WBC Count: 8.4 10*3/uL (ref 4.0–10.5)
nRBC: 0 % (ref 0.0–0.2)

## 2020-05-13 LAB — PROTIME-INR
INR: 1.4 — ABNORMAL HIGH (ref 0.8–1.2)
Prothrombin Time: 16.9 seconds — ABNORMAL HIGH (ref 11.4–15.2)

## 2020-05-13 NOTE — Progress Notes (Signed)
..  The following medication: Nplate was re-approved for Replacement from [employer]. Approved 05/29/2020 to 05/29/2021. No lapse in coverage, continued care.  Original Expiration of Assistance: 05/28/2020. Reason: Self Pay ID: [id number] Next DOS: none scheduled at this time.

## 2020-05-18 ENCOUNTER — Telehealth: Payer: Self-pay

## 2020-05-18 ENCOUNTER — Other Ambulatory Visit: Payer: Self-pay

## 2020-05-18 DIAGNOSIS — I82402 Acute embolism and thrombosis of unspecified deep veins of left lower extremity: Secondary | ICD-10-CM

## 2020-05-18 MED ORDER — RIVAROXABAN (XARELTO) VTE STARTER PACK (15 & 20 MG): ORAL_TABLET | ORAL | 0 refills | Status: DC

## 2020-05-18 MED FILL — XARELTO STARTER PACK: 15 & 20 | 28 days supply | Qty: 51 | Fill #0

## 2020-05-18 NOTE — Telephone Encounter (Signed)
-----   Message from Malachy Mood, MD sent at 05/17/2020  7:50 AM EST ----- Please let pt know her lab results, INR low again, not sure why. Since her plt has been normal for a while, OK to switch coumadin to Xarelto 20mg  daily, please call in (if we can get it covered) and switch.    05/17/2020

## 2020-05-18 NOTE — Telephone Encounter (Signed)
[name], spanish interpretor, left vm for Ms [name] to return her call.  Raynelle Fanning will interpret for new medication instructions.

## 2020-05-21 ENCOUNTER — Telehealth: Payer: Self-pay

## 2020-05-21 NOTE — Telephone Encounter (Signed)
I spoke with Bridget Hartshorn, Ms Suarez's daughter.  I told her he Mom needs to take Coumadin 7.5 mg daily and blood draw this Thursday.  Scheduling message sent to add weekly lab appts

## 2020-05-24 ENCOUNTER — Telehealth: Payer: Self-pay | Admitting: Hematology

## 2020-05-24 NOTE — Telephone Encounter (Signed)
Scheduled appointments per 03/04 scheduled message. Contacted patient, patient is aware.

## 2020-05-27 ENCOUNTER — Other Ambulatory Visit: Payer: Self-pay

## 2020-05-27 ENCOUNTER — Inpatient Hospital Stay: Payer: No Typology Code available for payment source | Attending: Nurse Practitioner

## 2020-05-27 DIAGNOSIS — Z7901 Long term (current) use of anticoagulants: Secondary | ICD-10-CM | POA: Insufficient documentation

## 2020-05-27 DIAGNOSIS — Z86718 Personal history of other venous thrombosis and embolism: Secondary | ICD-10-CM | POA: Insufficient documentation

## 2020-05-27 DIAGNOSIS — D693 Immune thrombocytopenic purpura: Secondary | ICD-10-CM | POA: Insufficient documentation

## 2020-05-27 DIAGNOSIS — D500 Iron deficiency anemia secondary to blood loss (chronic): Secondary | ICD-10-CM

## 2020-05-27 DIAGNOSIS — M5432 Sciatica, left side: Secondary | ICD-10-CM | POA: Insufficient documentation

## 2020-05-27 DIAGNOSIS — I82402 Acute embolism and thrombosis of unspecified deep veins of left lower extremity: Secondary | ICD-10-CM

## 2020-05-27 DIAGNOSIS — Z803 Family history of malignant neoplasm of breast: Secondary | ICD-10-CM | POA: Insufficient documentation

## 2020-05-27 DIAGNOSIS — Z5181 Encounter for therapeutic drug level monitoring: Secondary | ICD-10-CM

## 2020-05-27 LAB — CBC WITH DIFFERENTIAL (CANCER CENTER ONLY)
Abs Immature Granulocytes: 0.03 10*3/uL (ref 0.00–0.07)
Basophils Absolute: 0 10*3/uL (ref 0.0–0.1)
Basophils Relative: 0 %
Eosinophils Absolute: 0.2 10*3/uL (ref 0.0–0.5)
Eosinophils Relative: 3 %
HCT: 37.9 % (ref 36.0–46.0)
Hemoglobin: 12.4 g/dL (ref 12.0–15.0)
Immature Granulocytes: 0 %
Lymphocytes Relative: 32 %
Lymphs Abs: 2.3 10*3/uL (ref 0.7–4.0)
MCH: 28.7 pg (ref 26.0–34.0)
MCHC: 32.7 g/dL (ref 30.0–36.0)
MCV: 87.7 fL (ref 80.0–100.0)
Monocytes Absolute: 0.8 10*3/uL (ref 0.1–1.0)
Monocytes Relative: 10 %
Neutro Abs: 4 10*3/uL (ref 1.7–7.7)
Neutrophils Relative %: 55 %
Platelet Count: 394 10*3/uL (ref 150–400)
RBC: 4.32 MIL/uL (ref 3.87–5.11)
RDW: 14.7 % (ref 11.5–15.5)
WBC Count: 7.4 10*3/uL (ref 4.0–10.5)
nRBC: 0 % (ref 0.0–0.2)

## 2020-05-27 LAB — IRON AND TIBC
Iron: 49 ug/dL (ref 41–142)
Saturation Ratios: 11 % — ABNORMAL LOW (ref 21–57)
TIBC: 431 ug/dL (ref 236–444)
UIBC: 382 ug/dL (ref 120–384)

## 2020-05-27 LAB — PROTIME-INR
INR: 1.4 — ABNORMAL HIGH (ref 0.8–1.2)
Prothrombin Time: 17 seconds — ABNORMAL HIGH (ref 11.4–15.2)

## 2020-05-27 LAB — FERRITIN: Ferritin: 25 ng/mL (ref 11–307)

## 2020-05-27 MED FILL — XARELTO STARTER PACK: 15 & 20 | 28 days supply | Qty: 51 | Fill #0

## 2020-05-28 ENCOUNTER — Telehealth: Payer: Self-pay

## 2020-05-28 NOTE — Telephone Encounter (Signed)
Late entry: Ms [name] completed the application for [name] patient assistance program for Xarelto.  I faxed the application and supporting documentation.  A prescription for Xarelto started pack was sent to [facility] and patient will pick up 05/27/2020.  Per Dr Mosetta Putt she was instructed to stop coumadin when she starts Xarelto.  I told her she will no longer need the weekly labs.    Ms [name] verbalized understanding.  This communication was facilitate with the interpretation services of Delorise Royals our spanish interpretor.

## 2020-06-03 ENCOUNTER — Other Ambulatory Visit: Payer: No Typology Code available for payment source

## 2020-06-07 ENCOUNTER — Inpatient Hospital Stay: Payer: Self-pay

## 2020-06-07 ENCOUNTER — Telehealth: Payer: Self-pay

## 2020-06-07 ENCOUNTER — Other Ambulatory Visit: Payer: Self-pay

## 2020-06-07 ENCOUNTER — Inpatient Hospital Stay (HOSPITAL_BASED_OUTPATIENT_CLINIC_OR_DEPARTMENT_OTHER): Payer: Self-pay | Admitting: Medical

## 2020-06-07 VITALS — BP 125/64 | HR 73 | Temp 97.4°F | Resp 18 | Ht <= 58 in | Wt 240.3 lb

## 2020-06-07 DIAGNOSIS — D693 Immune thrombocytopenic purpura: Secondary | ICD-10-CM

## 2020-06-07 DIAGNOSIS — M5432 Sciatica, left side: Secondary | ICD-10-CM

## 2020-06-07 LAB — CBC WITH DIFFERENTIAL (CANCER CENTER ONLY)
Abs Immature Granulocytes: 0.05 10*3/uL (ref 0.00–0.07)
Basophils Absolute: 0 10*3/uL (ref 0.0–0.1)
Basophils Relative: 0 %
Eosinophils Absolute: 0.2 10*3/uL (ref 0.0–0.5)
Eosinophils Relative: 3 %
HCT: 39.5 % (ref 36.0–46.0)
Hemoglobin: 12.8 g/dL (ref 12.0–15.0)
Immature Granulocytes: 1 %
Lymphocytes Relative: 16 %
Lymphs Abs: 1.3 10*3/uL (ref 0.7–4.0)
MCH: 28.5 pg (ref 26.0–34.0)
MCHC: 32.4 g/dL (ref 30.0–36.0)
MCV: 88 fL (ref 80.0–100.0)
Monocytes Absolute: 0.6 10*3/uL (ref 0.1–1.0)
Monocytes Relative: 8 %
Neutro Abs: 6 10*3/uL (ref 1.7–7.7)
Neutrophils Relative %: 72 %
Platelet Count: 370 10*3/uL (ref 150–400)
RBC: 4.49 MIL/uL (ref 3.87–5.11)
RDW: 14.6 % (ref 11.5–15.5)
WBC Count: 8.2 10*3/uL (ref 4.0–10.5)
nRBC: 0 % (ref 0.0–0.2)

## 2020-06-07 MED ORDER — METHOCARBAMOL 500 MG PO TABS: 500.0000 mg | ORAL_TABLET | Freq: Four times a day (QID) | ORAL | 0 refills | Status: DC

## 2020-06-07 MED ORDER — PREDNISONE 5 MG PO TABS: ORAL_TABLET | ORAL | 0 refills | Status: DC

## 2020-06-07 NOTE — Telephone Encounter (Signed)
Via [name] our spanish interpretor, [name] has a h/a that started shortly after starting Xarelto.  She has tried tylenol but that doesn't help.  She als states she has pain in her leg.  She would like to be seen.  Appt for lab and Marga Hoots PA-C made for today.

## 2020-06-09 NOTE — Progress Notes (Signed)
[facility] Progress Note   [name] [id number] 1975-05-06 45 y.o.  [name] is managed by Dr. Malachy Mood  Actively treated with chemotherapy/immunotherapy/hormonal therapy: not applicable  Next scheduled appointment with provider: 06/24/2020  Assessment: Plan:    Sciatica of left side - Plan: predniSONE (DELTASONE) 5 MG tablet, methocarbamol (ROBAXIN) 500 MG tablet  Chronic ITP (idiopathic thrombocytopenia) (HCC)   Sciatica left side: The patient was given a prescription for prednisone taper and was given a prescription for Robaxin.  History of ITP: Patient's labs returned today showing a platelet count of 370.  She is scheduled to be seen and follow-up by Dr. Malachy Mood on 06/24/2020.  She is being followed conservatively.  Please see After Visit Summary for patient specific instructions.  Future Appointments  Date Time Provider Department Center  06/24/2020  8:45 AM [facility] None  06/24/2020  9:20 AM Malachy Mood, MD [facility] None    No orders of the defined types were placed in this encounter.      Subjective:   Patient ID:  [name] is a 45 y.o. (DOB 07-Nov-1975) female.  Chief Complaint: No chief complaint on file.   HPI [name]  is a 45 y.o. female with a diagnosis of a history of chronic ITP.  She is followed conservatively by Dr. Malachy Mood.  Her labs today returned showing a platelet count of 370.  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  She presents to the clinic today with a report of left leg pain which she rates as 9/10.  She reports that the pain is constant.  She has had the pain for at least 2 weeks.  She reports it is keeping her awake at night.  She denies any trauma or changes in activity.   Medications: I have reviewed the patient's current medications.  Allergies: No Known Allergies  Past Medical  History:  Diagnosis Date  . Anemia   . Chronic ITP (idiopathic thrombocytopenia) (HCC)   . Depression   . Headache   . Lumbar radiculopathy, right 11/13/2019  . 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 BREAST SURGERY  biopsy  . LOWER EXTREMITY VENOGRAPHY Left 10/08/2019   Procedure: LOWER EXTREMITY VENOGRAPHY;  Surgeon: Cephus Shelling, MD;  Location: [facility] INVASIVE CV LAB;  Service: Cardiovascular;  Laterality: Left;  . PERIPHERAL VASCULAR INTERVENTION Left 10/08/2019   Procedure: PERIPHERAL VASCULAR INTERVENTION;  Surgeon: Cephus Shelling, MD;  Location: [facility] INVASIVE CV LAB;  Service: Cardiovascular;  Laterality: Left;  LT COMMON ILIAC VEIN  . PERIPHERAL VASCULAR THROMBECTOMY N/A 10/08/2019   Procedure: PERIPHERAL VASCULAR THROMBECTOMY;  Surgeon: Cephus Shelling, MD;  Location: [facility] INVASIVE CV LAB;  Service: Cardiovascular;  Laterality: N/A;  . SPLENECTOMY, TOTAL N/A 09/16/2015   Procedure: OPEN SPLENECTOMY;  Surgeon: Chevis Pretty III, MD;  Location: [facility] OR;  Service: General;  Laterality: N/A;    Family History  Problem Relation Age of Onset  . Diabetes Mother   . Breast cancer Mother   . Diabetes Father   . Uterine cancer Sister   . Breast cancer Sister   . Breast cancer Sister   . Breast cancer Sister     Social History   Socioeconomic History  . Marital status: Married    Spouse name: Not on file  . Number  of children: 6  . Years of education: Not on file  . Highest education level: 9th grade  Occupational History  . Occupation: packs personal products  Tobacco Use  . Smoking status: Never Smoker  . Smokeless tobacco: Never Used  Vaping Use  . Vaping Use: Never used  Substance and Sexual Activity  . Alcohol use: No    Alcohol/week: 0.0 standard drinks  . Drug use: No  . Sexual activity: Not Currently  Other Topics Concern  . Not on file  Social History  Narrative  . Not on file   Social Determinants of Health   Financial Resource Strain: Not on file  Food Insecurity: Not on file  Transportation Needs: No Transportation Needs  . Lack of Transportation (Medical): No  . Lack of Transportation (Non-Medical): No  Physical Activity: Not on file  Stress: Not on file  Social Connections: Not on file  Intimate Partner Violence: Not on file    Past Medical History, Surgical history, Social history, and Family history were reviewed and updated as appropriate.   Please see review of systems for further details on the patient's review from today.   Review of Systems:  Review of Systems  Constitutional: Negative for chills, diaphoresis and fever.  HENT: Negative for trouble swallowing and voice change.   Respiratory: Negative for cough, chest tightness, shortness of breath and wheezing.   Cardiovascular: Negative for chest pain and palpitations.  Gastrointestinal: Negative for abdominal pain, constipation, diarrhea, nausea and vomiting.  Musculoskeletal: Positive for gait problem and myalgias. Negative for back pain.  Neurological: Negative for dizziness, light-headedness and headaches.    Objective:   Physical Exam:  BP 125/64 (BP Location: Right Arm, Patient Position: Sitting)   Pulse 73   Temp (!) 97.4 F (36.3 C) (Tympanic)   Resp 18   Ht 4\' 9"  (1.448 m)   Wt 240 lb 4.8 oz (109 kg)   SpO2 98%   BMI 52.00 kg/m  ECOG: 0  Physical Exam Constitutional:      General: She is not in acute distress.    Appearance: She is not diaphoretic.  HENT:     Head: Normocephalic and atraumatic.  Cardiovascular:     Rate and Rhythm: Normal rate and regular rhythm.     Heart sounds: Normal heart sounds. No murmur heard. No friction rub. No gallop.   Pulmonary:     Effort: Pulmonary effort is normal. No respiratory distress.     Breath sounds: Normal breath sounds. No wheezing or rales.  Musculoskeletal:        General: Tenderness  present.     Right lower leg: No edema.     Left lower leg: No edema.     Comments: The patient's lower extremities are bilaterally equal at 45 cm at 9 cm distal to the tibial tuberosity.  Tenderness is noted over the left sciatic notch.  The patient was noted to have a positive left straight leg raise.  Skin:    General: Skin is warm and dry.     Findings: No erythema or rash.  Neurological:     Mental Status: She is alert.     Coordination: Coordination normal.     Gait: Gait normal.     Lab Review:     Component Value Date/Time   NA 141 04/29/2020 0805   K 3.6 04/29/2020 0805   CL 108 04/29/2020 0805   CO2 25 04/29/2020 0805   GLUCOSE 98 04/29/2020 0805   BUN 14  04/29/2020 0805   CREATININE 0.63 04/29/2020 0805   CALCIUM 9.1 04/29/2020 0805   PROT 6.7 04/29/2020 0805   ALBUMIN 3.8 04/29/2020 0805   AST 21 04/29/2020 0805   ALT 23 04/29/2020 0805   ALKPHOS 58 04/29/2020 0805   BILITOT 0.4 04/29/2020 0805   GFRNONAA >60 04/29/2020 0805   GFRAA >60 12/04/2019 0822       Component Value Date/Time   WBC 8.2 06/07/2020 1422   WBC 8.6 10/20/2019 0150   RBC 4.49 06/07/2020 1422   HGB 12.8 06/07/2020 1422   HCT 39.5 06/07/2020 1422   HCT 27.3 (L) 09/21/2015 0900   PLT 370 06/07/2020 1422   MCV 88.0 06/07/2020 1422   MCH 28.5 06/07/2020 1422   MCHC 32.4 06/07/2020 1422   RDW 14.6 06/07/2020 1422   LYMPHSABS 1.3 06/07/2020 1422   MONOABS 0.6 06/07/2020 1422   EOSABS 0.2 06/07/2020 1422   BASOSABS 0.0 06/07/2020 1422   -------------------------------  Imaging from last 24 hours (if applicable):  Radiology interpretation: No results found.      This case was discussed with Dr. Mosetta Putt. She expressed agreement with my management of this patient.

## 2020-06-10 ENCOUNTER — Other Ambulatory Visit: Payer: No Typology Code available for payment source

## 2020-06-15 ENCOUNTER — Other Ambulatory Visit (HOSPITAL_COMMUNITY): Payer: Self-pay

## 2020-06-17 ENCOUNTER — Other Ambulatory Visit: Payer: No Typology Code available for payment source

## 2020-06-17 NOTE — Progress Notes (Signed)
[name]   [phone] [phone]) [phone]   Clinic Follow up Note   Patient Care Team: Malachy Mood, MD as PCP - General (Hematology) Levert Feinstein, MD as Consulting Physician (Oncology) [name] (Hematology and Oncology) Elwin Mocha, MD as Consulting Physician (Ophthalmology) Justice Deeds, MD (Internal Medicine)  Date of Service:  06/24/2020   CHIEF COMPLAINT: F/u ofrefractoryITP,acutesubmassive bilateralPE, left DVT  PREVIOUS ANDCURRENT THERAPY: -Promacta 25mg  daily starting 10/22/18. Stopped 04/08/18 due to recurrent flares.  -Nplateq2weeks starting in 2 weeks. Increased to weekly on 08/11/19 due to recurrent ITP flares.Willgive her 2 mcg/kg if platelet normal, and increase to 3 mcg/kg if platelet less than 150K. Increased to 33mcg/kg starting 09/03/19.Increased to 57mcg/kg on 09/10/19 and increase again to 63mcg/kg starting 09/17/19.Due toDVT and PE, holdingNplate since 7/26/21due to DVTunless plt <5K -Periodic use of steroids (decadron) and IVIG  -Rituxan weekly X4,completed 11/03/2019 -Coumadin starting 10/15/19, changed to 7.5mg  on Mondays, 5mg  daily for the rest of week on 12/04/2019  INTERVAL HISTORY:  [name] is here for a follow up. She was last seen by me on 04/29/20, and by PA Vann on 06/07/20 in the interim for sciatic pain. She presents to the clinic alone. She notes a bruise to her right outer leg. She reports she has periods, lasts about a week. She changes her pad every 1-2 hours. She notes this is the same on and off the Xarelto. She reports rectal bleeding since December. It was light at first but has been daily the last two months. She reports pain with bowel movements, which she notes she has 1-2 movements per day. She denies a family history of colon cancer or polyps. She has never had a colonoscopy before. She denies taking oral iron.  She reports sciatic pain is improved since her last visit and  is now tolerable.   REVIEW OF SYSTEMS:   Constitutional: Denies fevers, chills or abnormal weight loss Eyes: Denies blurriness of vision Ears, nose, mouth, throat, and face: Denies mucositis or sore throat Respiratory: Denies cough, dyspnea or wheezes Cardiovascular: Denies palpitation, chest discomfort or lower extremity swelling Gastrointestinal:  Denies nausea, heartburn or change in bowel habits Skin: Denies abnormal skin rashes Lymphatics: Denies new lymphadenopathy or easy bruising Neurological:Denies numbness, tingling or new weaknesses Behavioral/Psych: Mood is stable, no new changes  All other systems were reviewed with the patient and are negative.  MEDICAL HISTORY:  Past Medical History:  Diagnosis Date  . Anemia   . Chronic ITP (idiopathic thrombocytopenia) (HCC)   . Depression   . Headache   . Lumbar radiculopathy, right 11/13/2019  . 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 BREAST SURGERY  biopsy  . LOWER EXTREMITY VENOGRAPHY Left 10/08/2019   Procedure: LOWER EXTREMITY VENOGRAPHY;  Surgeon: 10/07/2015, MD;  Location: [facility] INVASIVE CV LAB;  Service: Cardiovascular;  Laterality: Left;  . PERIPHERAL VASCULAR INTERVENTION Left 10/08/2019   Procedure: PERIPHERAL VASCULAR INTERVENTION;  Surgeon: Cephus Shelling, MD;  Location: [facility] INVASIVE CV LAB;  Service: Cardiovascular;  Laterality: Left;  LT COMMON ILIAC VEIN  . PERIPHERAL VASCULAR THROMBECTOMY N/A 10/08/2019   Procedure: PERIPHERAL VASCULAR THROMBECTOMY;  Surgeon: [facility], MD;  Location: [facility] INVASIVE CV LAB;  Service: Cardiovascular;  Laterality: N/A;  . SPLENECTOMY, TOTAL N/A 09/16/2015   Procedure: OPEN SPLENECTOMY;  Surgeon: Cephus Shelling III, MD;  Location: [facility]  OR;  Service: General;  Laterality: N/A;    I have reviewed the social history and family history with the patient and  they are unchanged from previous note.  ALLERGIES:  has No Known Allergies.  MEDICATIONS:  Current Outpatient Medications  Medication Sig Dispense Refill  . ferrous sulfate 324 MG TBEC Take 324 mg by mouth daily with breakfast.    . methocarbamol (ROBAXIN) 500 MG tablet Take 1 tablet (500 mg total) by mouth 4 (four) times daily. 40 tablet 0  . pantoprazole (PROTONIX) 40 MG tablet Take 1 tablet (40 mg total) by mouth daily. 30 tablet 0  . rivaroxaban (XARELTO) 20 MG TABS tablet Take 1 tablet (20 mg total) by mouth daily with supper. 30 tablet 6   No current facility-administered medications for this visit.    PHYSICAL EXAMINATION: ECOG PERFORMANCE STATUS: 1 - Symptomatic but completely ambulatory  Vitals:   06/24/20 0838  BP: (!) 103/46  Pulse: 70  Resp: 20  Temp: (!) 97.2 F (36.2 C)  SpO2: 98%   Filed Weights   06/24/20 0838  Weight: 248 lb 1.6 oz (112.5 kg)    GENERAL:alert, no distress and comfortable SKIN: skin color, texture, turgor are normal, no rashes or significant lesions EYES: normal, Conjunctiva are pink and non-injected, sclera clear OROPHARYNX:no exudate, no erythema and lips, buccal mucosa, and tongue normal NECK: supple, thyroid normal size, non-tender, without nodularity LYMPH:  no palpable lymphadenopathy in the cervical, axillary  LUNGS: clear to auscultation and percussion with normal breathing effort HEART: regular rate & rhythm and no murmurs and no lower extremity edema ABDOMEN:abdomen soft, non-tender and normal bowel sounds RECTAL: external hemorrhoids present, no bleeding, no palpable mass on rectal exam  Musculoskeletal:no cyanosis of digits and no clubbing  NEURO: alert & oriented x 3 with fluent speech, no focal motor/sensory deficits  LABORATORY DATA:  I have reviewed the data as listed CBC Latest Ref Rng & Units 06/24/2020 06/07/2020 05/27/2020  WBC 4.0 - 10.5 K/uL 11.3(H) 8.2 7.4  Hemoglobin 12.0 - 15.0 g/dL 11.0(L) 12.8 12.4  Hematocrit  36.0 - 46.0 % 34.0(L) 39.5 37.9  Platelets 150 - 400 K/uL 371 370 394     CMP Latest Ref Rng & Units 04/29/2020 03/15/2020 02/16/2020  Glucose 70 - 99 mg/dL 98 161(W) 960(A)  BUN 6 - 20 mg/dL 14 12 11   Creatinine 0.44 - 1.00 mg/dL 5.40 9.81  Sodium 135 - 145 mmol/L 141 136 138  Potassium 3.5 - 5.1 mmol/L 3.6 4.1 3.9  Chloride 98 - 111 mmol/L 108 103 105  CO2 22 - 32 mmol/L 25 21(L) 23  Calcium 8.9 - 10.3 mg/dL 9.1 9.5 9.4  Total Protein 6.5 - 8.1 g/dL 6.7 7.5 7.0  Total Bilirubin 0.3 - 1.2 mg/dL 0.4 1.91) 0.3  Alkaline Phos 38 - 126 U/L 58 74 64  AST 15 - 41 U/L 21 15 14(L)  ALT 0 - 44 U/L 23 22 16       RADIOGRAPHIC STUDIES: I have personally reviewed the radiological images as listed and agreed with the findings in the report. No results found.   ASSESSMENT & PLAN:  [name] is a 45 y.o. female with    1. RecurrentrefractoryITP -She has a history of recurrentand refractoryITP. She is s/p splenectomy and treated withsteroid, IVIG,NPlate,Rituxanand chemoin the past (the first episode) dueto refractory disease.  -She has had repeated ITP Flares with occurrences in 02/2018, 08/04/19, 08/2019 and 09/2019. 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  -Due to her frequent recurrenceof ITP,sherecently completed 4 doses ofRituxan 10/16/19-11/06/19. -We have heldNplatesince 7/26/21due to thrombosis, may consider if plt drops <5K -Will periodicallyuse of steroids (decadron) and IVIG for acute flare  -She received weekly rituximab 4 times in July and August 2021, responded very well.  It has been normal since then. 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 -She had submassive bilateral PE on  7/26/21CTA during her hospitalization for ITP flare. She was Discharged on 8/2/21on Coumadin 7.5 mg daily.Dose adjusted as indicated.  -She was switched to Xarelto 3 weeks ago. She has been tolerating well, will continue   3. Rectal bleeding, likely secondary to hemorrhoids -She reports rectal bleeding that began in December and has become daily within the last two months. -She also has heavy periods that last a week and require her to change her pad every 1-2 hours. -I will refer her to GI for further evaluation  4.History of small foci ofsubarachnoid hemorrhage,Secondary to #1 -recovered well overall.Will continue to monitor. -Multiple head CTs have been stable lately  5.Headaches, Chronic dizziness, Hypotension -ChronicHA, since 03/2019 hospitalization. She has been seen Dr. Barbaraann Cao who felt symptoms were consistent with occipital neuralgia. -Headache resolved  6. COVID19 (+) in early 04/2019, treated monoclonial antibodies. S/p COVID vaccine series.  7. Financial Support -She pays out of pocket but submits her bills and receives pay assistance. -Her application for Medicaid was denied. She does not have orange card currently.She was approved for drug assistance grant. Blake Divine will f/u with her.  8. Depression -Has tried citalopram and sertraline in the past.She is no longer on Cymbaltadue to worsened depression.I started her onCelexa (11/05/19). Mood stable.     PLAN: -Continue Xarelto -Labs in 1 month -f/u in 2 months -Referral to GI -iv iron if iron low, no need for now    No problem-specific Assessment & Plan notes found for this encounter.   Orders Placed This Encounter  Procedures  . Ambulatory referral to Gastroenterology    Referral Priority:   Routine    Referral Type:   Consultation    Referral Reason:   Specialty Services Required    Number of Visits Requested:   1   All questions were answered. The patient knows to call the clinic with  any problems, questions or concerns. No barriers to learning was detected. The total time spent in the appointment was 30 minutes.     Malachy Mood, MD 06/24/2020   I, Mickie Bail, am acting as scribe for Malachy Mood, MD.   I have reviewed the above documentation for accuracy and completeness, and I agree with the above.

## 2020-06-23 ENCOUNTER — Other Ambulatory Visit: Payer: Self-pay

## 2020-06-23 DIAGNOSIS — I82402 Acute embolism and thrombosis of unspecified deep veins of left lower extremity: Secondary | ICD-10-CM

## 2020-06-23 MED ORDER — RIVAROXABAN 20 MG PO TABS: 20.0000 mg | ORAL_TABLET | Freq: Every day | ORAL | 6 refills | Status: DC

## 2020-06-24 ENCOUNTER — Encounter: Payer: Self-pay | Admitting: Hematology

## 2020-06-24 ENCOUNTER — Inpatient Hospital Stay: Payer: Self-pay | Attending: Nurse Practitioner | Admitting: Hematology

## 2020-06-24 ENCOUNTER — Inpatient Hospital Stay: Payer: Self-pay

## 2020-06-24 ENCOUNTER — Other Ambulatory Visit: Payer: Self-pay

## 2020-06-24 VITALS — BP 103/46 | HR 70 | Temp 97.2°F | Resp 20 | Ht <= 58 in | Wt 248.1 lb

## 2020-06-24 DIAGNOSIS — D693 Immune thrombocytopenic purpura: Secondary | ICD-10-CM

## 2020-06-24 DIAGNOSIS — D509 Iron deficiency anemia, unspecified: Secondary | ICD-10-CM

## 2020-06-24 DIAGNOSIS — Z86711 Personal history of pulmonary embolism: Secondary | ICD-10-CM | POA: Insufficient documentation

## 2020-06-24 DIAGNOSIS — R42 Dizziness and giddiness: Secondary | ICD-10-CM | POA: Insufficient documentation

## 2020-06-24 DIAGNOSIS — M5430 Sciatica, unspecified side: Secondary | ICD-10-CM | POA: Insufficient documentation

## 2020-06-24 DIAGNOSIS — K625 Hemorrhage of anus and rectum: Secondary | ICD-10-CM | POA: Insufficient documentation

## 2020-06-24 DIAGNOSIS — I959 Hypotension, unspecified: Secondary | ICD-10-CM | POA: Insufficient documentation

## 2020-06-24 DIAGNOSIS — R519 Headache, unspecified: Secondary | ICD-10-CM | POA: Insufficient documentation

## 2020-06-24 DIAGNOSIS — Z9081 Acquired absence of spleen: Secondary | ICD-10-CM | POA: Insufficient documentation

## 2020-06-24 DIAGNOSIS — Z86718 Personal history of other venous thrombosis and embolism: Secondary | ICD-10-CM | POA: Insufficient documentation

## 2020-06-24 DIAGNOSIS — D500 Iron deficiency anemia secondary to blood loss (chronic): Secondary | ICD-10-CM

## 2020-06-24 DIAGNOSIS — Z7901 Long term (current) use of anticoagulants: Secondary | ICD-10-CM | POA: Insufficient documentation

## 2020-06-24 DIAGNOSIS — F32A Depression, unspecified: Secondary | ICD-10-CM | POA: Insufficient documentation

## 2020-06-24 DIAGNOSIS — D649 Anemia, unspecified: Secondary | ICD-10-CM | POA: Insufficient documentation

## 2020-06-24 DIAGNOSIS — Z79899 Other long term (current) drug therapy: Secondary | ICD-10-CM | POA: Insufficient documentation

## 2020-06-24 LAB — IRON AND TIBC
Iron: 103 ug/dL (ref 41–142)
Saturation Ratios: 26 % (ref 21–57)
TIBC: 404 ug/dL (ref 236–444)
UIBC: 301 ug/dL (ref 120–384)

## 2020-06-24 LAB — CBC WITH DIFFERENTIAL (CANCER CENTER ONLY)
Abs Immature Granulocytes: 0.07 10*3/uL (ref 0.00–0.07)
Basophils Absolute: 0 10*3/uL (ref 0.0–0.1)
Basophils Relative: 0 %
Eosinophils Absolute: 0.1 10*3/uL (ref 0.0–0.5)
Eosinophils Relative: 1 %
HCT: 34 % — ABNORMAL LOW (ref 36.0–46.0)
Hemoglobin: 11 g/dL — ABNORMAL LOW (ref 12.0–15.0)
Immature Granulocytes: 1 %
Lymphocytes Relative: 21 %
Lymphs Abs: 2.3 10*3/uL (ref 0.7–4.0)
MCH: 28.6 pg (ref 26.0–34.0)
MCHC: 32.4 g/dL (ref 30.0–36.0)
MCV: 88.5 fL (ref 80.0–100.0)
Monocytes Absolute: 0.9 10*3/uL (ref 0.1–1.0)
Monocytes Relative: 8 %
Neutro Abs: 7.8 10*3/uL — ABNORMAL HIGH (ref 1.7–7.7)
Neutrophils Relative %: 69 %
Platelet Count: 371 10*3/uL (ref 150–400)
RBC: 3.84 MIL/uL — ABNORMAL LOW (ref 3.87–5.11)
RDW: 15.1 % (ref 11.5–15.5)
WBC Count: 11.3 10*3/uL — ABNORMAL HIGH (ref 4.0–10.5)
nRBC: 0.2 % (ref 0.0–0.2)

## 2020-06-24 LAB — FERRITIN: Ferritin: 30 ng/mL (ref 11–307)

## 2020-06-29 ENCOUNTER — Telehealth: Payer: Self-pay | Admitting: Hematology

## 2020-06-29 NOTE — Telephone Encounter (Signed)
Left message with follow-up appointments per 4/7 los. Gave option to call back to reschedule if needed. 

## 2020-07-07 ENCOUNTER — Encounter: Payer: Self-pay | Admitting: Physician Assistant

## 2020-07-09 ENCOUNTER — Telehealth: Payer: Self-pay

## 2020-07-09 DIAGNOSIS — R1013 Epigastric pain: Secondary | ICD-10-CM

## 2020-07-09 MED ORDER — PANTOPRAZOLE SODIUM 20 MG PO TBEC: 20.0000 mg | DELAYED_RELEASE_TABLET | Freq: Every day | ORAL | 2 refills | Status: DC

## 2020-07-09 NOTE — Telephone Encounter (Signed)
I spoke with Pearla again,  [name] does of some rectal bleeding.  She is fatigued but has no dyspnea.  Dr Mosetta Putt wants [name] to hold 1 dose of Xarelto and then only take 1/2 pill daily until she sees GI on 5/2.  I called Pearla twice with no answer.  I left a detailed message reviewing the above recommendations.

## 2020-07-09 NOTE — Telephone Encounter (Signed)
Delorise Royals, our spanish intrpreter called stating that Ms [name] is having upper mic abd burning for the past 6 days.  I called Pearla for more information.  She denies any other symptoms.  Per Dr Mosetta Putt pt to start protonix and establish with PCP at [facility]

## 2020-07-22 ENCOUNTER — Ambulatory Visit: Payer: No Typology Code available for payment source | Admitting: Physician Assistant

## 2020-07-23 ENCOUNTER — Other Ambulatory Visit: Payer: Self-pay

## 2020-07-23 ENCOUNTER — Inpatient Hospital Stay: Payer: No Typology Code available for payment source | Attending: Nurse Practitioner

## 2020-07-23 DIAGNOSIS — D693 Immune thrombocytopenic purpura: Secondary | ICD-10-CM | POA: Insufficient documentation

## 2020-07-23 DIAGNOSIS — D500 Iron deficiency anemia secondary to blood loss (chronic): Secondary | ICD-10-CM

## 2020-07-23 LAB — CMP (CANCER CENTER ONLY)
ALT: 14 U/L (ref 0–44)
AST: 15 U/L (ref 15–41)
Albumin: 3.6 g/dL (ref 3.5–5.0)
Alkaline Phosphatase: 64 U/L (ref 38–126)
Anion gap: 9 (ref 5–15)
BUN: 14 mg/dL (ref 6–20)
CO2: 27 mmol/L (ref 22–32)
Calcium: 9.2 mg/dL (ref 8.9–10.3)
Chloride: 105 mmol/L (ref 98–111)
Creatinine: 0.69 mg/dL (ref 0.44–1.00)
GFR, Estimated: >60 mL/min (ref >60)
Glucose, Bld: 109 mg/dL — ABNORMAL HIGH (ref 70–99)
Potassium: 3.5 mmol/L (ref 3.5–5.1)
Sodium: 141 mmol/L (ref 135–145)
Total Bilirubin: 0.3 mg/dL (ref 0.3–1.2)
Total Protein: 6.4 g/dL — ABNORMAL LOW (ref 6.5–8.1)

## 2020-07-23 LAB — CBC WITH DIFFERENTIAL (CANCER CENTER ONLY)
Abs Immature Granulocytes: 0.03 10*3/uL (ref 0.00–0.07)
Basophils Absolute: 0.1 10*3/uL (ref 0.0–0.1)
Basophils Relative: 1 %
Eosinophils Absolute: 0.2 10*3/uL (ref 0.0–0.5)
Eosinophils Relative: 2 %
HCT: 37.4 % (ref 36.0–46.0)
Hemoglobin: 12 g/dL (ref 12.0–15.0)
Immature Granulocytes: 0 %
Lymphocytes Relative: 30 %
Lymphs Abs: 2.7 10*3/uL (ref 0.7–4.0)
MCH: 28.2 pg (ref 26.0–34.0)
MCHC: 32.1 g/dL (ref 30.0–36.0)
MCV: 88 fL (ref 80.0–100.0)
Monocytes Absolute: 0.8 10*3/uL (ref 0.1–1.0)
Monocytes Relative: 9 %
Neutro Abs: 5.2 10*3/uL (ref 1.7–7.7)
Neutrophils Relative %: 58 %
Platelet Count: 362 10*3/uL (ref 150–400)
RBC: 4.25 MIL/uL (ref 3.87–5.11)
RDW: 14.5 % (ref 11.5–15.5)
WBC Count: 8.9 10*3/uL (ref 4.0–10.5)
nRBC: 0 % (ref 0.0–0.2)

## 2020-07-23 LAB — FERRITIN: Ferritin: 36 ng/mL (ref 11–307)

## 2020-07-23 LAB — IRON AND TIBC
Iron: 82 ug/dL (ref 41–142)
Saturation Ratios: 21 % (ref 21–57)
TIBC: 386 ug/dL (ref 236–444)
UIBC: 304 ug/dL (ref 120–384)

## 2020-07-26 ENCOUNTER — Encounter: Payer: Self-pay | Admitting: Physician Assistant

## 2020-07-26 ENCOUNTER — Other Ambulatory Visit: Payer: Self-pay

## 2020-07-26 ENCOUNTER — Telehealth: Payer: Self-pay

## 2020-07-26 ENCOUNTER — Ambulatory Visit (INDEPENDENT_AMBULATORY_CARE_PROVIDER_SITE_OTHER): Payer: Self-pay | Admitting: Physician Assistant

## 2020-07-26 VITALS — BP 112/74 | HR 74 | Ht 58.8 in | Wt 247.0 lb

## 2020-07-26 DIAGNOSIS — Z7901 Long term (current) use of anticoagulants: Secondary | ICD-10-CM

## 2020-07-26 DIAGNOSIS — R109 Unspecified abdominal pain: Secondary | ICD-10-CM

## 2020-07-26 DIAGNOSIS — K625 Hemorrhage of anus and rectum: Secondary | ICD-10-CM

## 2020-07-26 NOTE — Telephone Encounter (Signed)
[facility] Pre-operative Risk Assessment     Request for surgical clearance:     Endoscopy Procedure  What type of surgery is being performed?     09/09/2020  When is this surgery scheduled?     Colonoscopy  What type of clearance is required ?   Pharmacy  Are there any medications that need to be held prior to surgery and how long? Xarelto starting 24 hours prior  Practice name and name of physician performing surgery?      [facility]  What is your office phone and fax number?      Phone- [phone]  [phone]  Anesthesia type (None, local, MAC, general) ?       MAC

## 2020-07-26 NOTE — Patient Instructions (Signed)
If you are age 46 or younger, your body mass index should be between 19-25. Your Body mass index is 50.23 kg/m. If this is out of the aformentioned range listed, please consider follow up with your Primary Care Provider.   You have been scheduled for a colonoscopy. Please follow written instructions given to you at your visit today.  Please pick up your prep supplies at the pharmacy within the next 1-3 days. If you use inhalers (even only as needed), please bring them with you on the day of your procedure.  Use Recticare Complete Apply 2-3 times daily for hemorrhoid discomfort.  Call the office later this week to see if we have gotten Plenvu samples in the office.  Follow up pending the results of your Colonoscopy.  Thank you for entrusting me with your care and choosing [facility].  Amy Esterwood, PA-C

## 2020-07-26 NOTE — Progress Notes (Signed)
Subjective:    Patient ID: [name], female    DOB: 1975/05/12, 45 y.o.   MRN: [id number]  HPI [name] is a pleasant 45 year old Hispanic female, non-English-speaking who is accompanied by her daughter who interprets.  Patient is referred by Dr. Mosetta Putt for evaluation of rectal bleeding, and recent anemia.  Patient has not had any prior GI evaluation. She reports bright red blood per rectum since December 2021.  This is been occurring on pretty much a daily basis noticing bright red blood on the tissue with bowel movements.  She has not apparently noted any blood mixed in with her bowel movements and has not had any melena though she says her stools stay a bit dark because she is on iron.  Bowel movements have been normal.  She has been having some rectal discomfort with bowel movements, and feels that she does have external hemorrhoids.  She also has been having some left mid abdominal pain over the past month which she says been fairly constant.  She is having a bowel movement every 1 to 2 days.  Appetite has been okay she does get some abdominal discomfort postprandially at times. Patient is status post splenectomy in 2017.  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  Also with chronic hepatitis B, prior subarachnoid hemorrhage and refractory chronic ITP as well as morbid obesity.  She is not currently on any medic Acacian for the ITP but has had intermittent treatment with steroids and IVIG and had previously completed a course of Rituxan. Labs on 06/24/2020 hemoglobin 11 hematocrit of 34 platelets 371 Repeat labs 07/23/2020 serum iron 82/TIBC 386/iron sat 21/ferritin 36 Hemoglobin 12/hematocrit 37.4 MCV of 88 and platelets 362.  Family history is negative for GI disease as far she is aware.  Review of Systems Pertinent positive and negative review of systems were noted in the above HPI section.  All other review of systems was otherwise  negative.  Outpatient Encounter Medications as of 07/26/2020  Medication Sig  . ferrous sulfate 324 MG TBEC Take 324 mg by mouth daily with breakfast.  . methocarbamol (ROBAXIN) 500 MG tablet Take 1 tablet (500 mg total) by mouth 4 (four) times daily. (Patient taking differently: Take 500 mg by mouth daily.)  . pantoprazole (PROTONIX) 20 MG tablet Take 1 tablet (20 mg total) by mouth daily.  . rivaroxaban (XARELTO) 20 MG TABS tablet Take 1 tablet (20 mg total) by mouth daily with supper.   No facility-administered encounter medications on file as of 07/26/2020.   No Known Allergies Patient Active Problem List   Diagnosis Date Noted  . Lumbar radiculopathy, right 11/13/2019  . Acute pulmonary embolism (HCC)   . Depression   . GERD (gastroesophageal reflux disease)   . May-Thurner syndrome   . DVT (deep venous thrombosis) (HCC) 10/07/2019  . History of ITP   . Leukocytosis   . Chronic headaches 08/28/2019  . Thrombocytopenia (HCC) 03/02/2019  . Borderline diabetes 10/16/2018  . Idiopathic thrombocytopenic purpura (ITP) (HCC) 10/14/2018  . Morbid obesity (HCC) 10/14/2018  . Hyperglycemia 10/14/2018  . 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 Chronic ITP (idiopathic thrombocytopenia) (HCC)   . Nontraumatic intracerebral hemorrhage (HCC)   . Menorrhagia with regular cycle   . Pressure ulcer 09/19/2015  . Gingival bleeding   . Chronic hepatitis B (HCC) 09/04/2015  . Subarachnoidal hemorrhage (HCC) 09/04/2015  . Severe thrombocytopenia (HCC) 09/04/2015  . Acute ITP (HCC) 08/28/2015  Social History   Socioeconomic History  . Marital status: Married    Spouse name: Not on file  . Number of children: 6  . Years of education: Not on file  . Highest education level: 9th grade  Occupational History  . Occupation: [employer]  Tobacco Use  . Smoking status: Never Smoker  . Smokeless tobacco: Never Used  Vaping Use  . Vaping Use: Never used  Substance and Sexual  Activity  . Alcohol use: No    Alcohol/week: 0.0 standard drinks  . Drug use: No  . Sexual activity: Not Currently  Other Topics Concern  . Not on file  Social History Narrative  . Not on file   Social Determinants of Health   Financial Resource Strain: Not on file  Food Insecurity: Not on file  Transportation Needs: No Transportation Needs  . Lack of Transportation (Medical): No  . Lack of Transportation (Non-Medical): No  Physical Activity: Not on file  Stress: Not on file  Social Connections: Not on file  Intimate Partner Violence: Not on file    [name]'s family history includes Breast cancer in her mother, sister, sister, and sister; Diabetes in her father and mother; Uterine cancer in her sister.      Objective:    Vitals:   07/26/20 0903  BP: 112/74  Pulse: 74  SpO2: 99%    Physical Exam Well-developed well-nourished Hispanic female in no acute distress.  Height, Weight 247, BMI 50.23 non-English-speaking  HEENT; nontraumatic normocephalic, EOMI, PE R LA, sclera anicteric. Oropharynx; not examined today Neck; supple, no JVD Cardiovascular; regular rate and rhythm with S1-S2, no murmur rub or gallop Pulmonary; Clear bilaterally Abdomen; soft, morbidly obese nondistended, she is tender in the left mid abdomen ,no palpable mass or hepatosplenomegaly, bowel sounds are active, large upper abdominal incisional scar Rectal; scant stool, heme-negative no appreciable fissure, small external hemorrhoids noninflamed, no palpable mass Skin; benign exam, no jaundice rash or appreciable lesions Extremities; no clubbing cyanosis or edema skin warm and dry Neuro/Psych; alert and oriented x4, grossly nonfocal mood and affect appropriate       Assessment & Plan:   #19 45 year old Hispanic female, non-English-speaking with 21-month history of frequent low-volume bright red blood with bowel movements in setting of chronic Xarelto Patient has primarily seen blood on  the tissue , She is also complaining of left-sided mid abdominal pain over the past 1 month which has been fairly constant  Etiology of bleeding and abdominal pain not clear.  Her bleeding may be secondary to internal hemorrhoids, cannot rule out occult colon lesion, colitis or proctitis  #2 chronic anticoagulation-on Xarelto 3.  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  Status post splenectomy 2017 5.  History of refractory chronic ITP-platelet count currently normal #6 history of chronic hepatitis B 7.  Morbid obesity 8.  GERD 9.  Recent anemia-improved  Plan; patient will be scheduled for colonoscopy with Dr. Myrtie Neither.  This will need to be done at the hospital due to BMI greater than 50.  Procedure was discussed in detail with the patient and her daughter including indications risk and benefits and she is agreeable to proceed. Xarelto will need to be held for 24 hours prior to the procedure.  We will communicate with her hematologist/Dr. Mosetta Putt to assure this is reasonable for this patient Continue Protonix 20 mg p.o. every morning Further recommendations pending findings at colonoscopy.  Elizabet Schweppe Oswald Hillock PA-C 07/26/2020   Cc: Malachy Mood, MD

## 2020-07-28 NOTE — Progress Notes (Signed)
____________________________________________________________  Attending physician addendum:  Thank you for sending this case to me. I have reviewed the entire note and agree with the plan.  She is on my [facility] endo schedule for June 23rd, next available date.  Incidentally, she had a positive Hepatitis B core antibody but negative surface antigen and normal LFTs, so does not have chronic HBV.  Amada Jupiter, MD  ____________________________________________________________

## 2020-08-18 NOTE — Progress Notes (Signed)
[facility]   [phone] [phone]   Clinic Follow up Note   Patient Care Team: Malachy Mood, MD as PCP - General (Hematology) Levert Feinstein, MD as Consulting Physician (Oncology) [name] (Hematology and Oncology) Elwin Mocha, MD as Consulting Physician (Ophthalmology) Justice Deeds, MD (Internal Medicine)  Date of Service:  08/23/2020  CHIEF COMPLAINT: F/u ofrefractoryITP,acutesubmassive bilateralPE, left DVT   CURRENT THERAPY:  -Promacta 25mg  daily starting 10/22/18. Stopped 04/08/18 due to recurrent flares.  -Nplateq2weeks starting in 2 weeks. Increased to weekly on 08/11/19 due to recurrent ITP flares.Willgive her 2 mcg/kg if platelet normal, and increase to 3 mcg/kg if platelet less than 150K. Increased to 28mcg/kg starting 09/03/19.Increased to 68mcg/kg on 09/10/19 and increase again to 97mcg/kg starting 09/17/19.Due toDVT and PE, holdingNplate since 7/26/21due to DVTunless plt <5K -Periodic use of steroids (decadron) and IVIG  -Rituxan weekly X4,completed 11/03/2019 -Coumadin starting 10/15/19, changed to 7.5mg  on Mondays, 5mg  daily for the rest of week on 12/04/2019  INTERVAL HISTORY:  [name] is here for a follow up of ITP. She was last seen by me 2 months ago. She presents to the clinic with her daughter who will translate for her. She notes she is doing fairly well. She notes has been having an accident with one fall. She has mild bruises on her legs. She notes when she would be walking her legs will weaken and give out. She notes dizziness is occasional. She notes she currently has cough for the past 2 weeks with nasal congestion with yellow phlegm. She fever or headaches. She notes she cannot sleep due to the cough. She has not seen PCP about this and has not tested for COVID. She plans to have colonoscopy on 09/09/20 with Dr [name]. She notes she has recently been constipated but taking OTC medication to  help.   REVIEW OF SYSTEMS:   Constitutional: Denies fevers, chills or abnormal weight loss Eyes: Denies blurriness of vision Ears, nose, mouth, throat, and face: Denies mucositis or sore throat Respiratory: Denies dyspnea or wheezes (+) Cough with yellow phlegm (+) Nasal congestion  Cardiovascular: Denies palpitation, chest discomfort or lower extremity swelling Gastrointestinal:  Denies nausea, heartburn (+) Constipation Skin: Denies abnormal skin rashes Lymphatics: Denies new lymphadenopathy or easy bruising Neurological:Denies numbness, tingling or new weaknesses Behavioral/Psych: Mood is stable, no new changes  All other systems were reviewed with the patient and are negative.  MEDICAL HISTORY:  Past Medical History:  Diagnosis Date  . Anemia   . Chronic ITP (idiopathic thrombocytopenia) (HCC)   . Depression   . Headache   . Lumbar radiculopathy, right 11/13/2019  . 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 BREAST SURGERY  biopsy  . LOWER EXTREMITY VENOGRAPHY Left 10/08/2019   Procedure: LOWER EXTREMITY VENOGRAPHY;  Surgeon: 10/07/2015, MD;  Location: [facility] INVASIVE CV LAB;  Service: Cardiovascular;  Laterality: Left;  . PERIPHERAL VASCULAR INTERVENTION Left 10/08/2019   Procedure: PERIPHERAL VASCULAR INTERVENTION;  Surgeon: Cephus Shelling, MD;  Location: [facility] INVASIVE CV LAB;  Service: Cardiovascular;  Laterality: Left;  LT COMMON ILIAC VEIN  . PERIPHERAL VASCULAR THROMBECTOMY N/A 10/08/2019   Procedure: PERIPHERAL VASCULAR THROMBECTOMY;  Surgeon: [facility], MD;  Location: [facility] INVASIVE CV LAB;  Service: Cardiovascular;  Laterality: N/A;  . SPLENECTOMY, TOTAL N/A 09/16/2015   Procedure: OPEN SPLENECTOMY;  Surgeon: Cephus Shelling  III, MD;  Location: [facility] OR;  Service: General;  Laterality: N/A;    I have reviewed the social history and family history  with the patient and they are unchanged from previous note.  ALLERGIES:  has No Known Allergies.  MEDICATIONS:  Current Outpatient Medications  Medication Sig Dispense Refill  . amoxicillin-clavulanate (AUGMENTIN) 875-125 MG tablet Take 1 tablet by mouth 2 (two) times daily. 14 tablet 0  . fluticasone (FLONASE) 50 MCG/ACT nasal spray Place 1 spray into both nostrils daily. 15.8 mL 1  . ferrous sulfate 324 MG TBEC Take 324 mg by mouth daily with breakfast.    . methocarbamol (ROBAXIN) 500 MG tablet Take 1 tablet (500 mg total) by mouth 4 (four) times daily. (Patient taking differently: Take 500 mg by mouth daily.) 40 tablet 0  . pantoprazole (PROTONIX) 20 MG tablet Take 1 tablet (20 mg total) by mouth daily. 30 tablet 2  . rivaroxaban (XARELTO) 20 MG TABS tablet Take 1 tablet (20 mg total) by mouth daily with supper. 30 tablet 6   No current facility-administered medications for this visit.    PHYSICAL EXAMINATION: ECOG PERFORMANCE STATUS: 1 - Symptomatic but completely ambulatory  Vitals:   08/23/20 0909  BP: 111/72  Pulse: 89  Resp: 18  Temp: 97.6 F (36.4 C)  SpO2: 95%   Filed Weights   08/23/20 0909  Weight: 244 lb 9.6 oz (110.9 kg)    GENERAL:alert, no distress and comfortable SKIN: skin color, texture, turgor are normal, no rashes or significant lesions EYES: normal, Conjunctiva are pink and non-injected, sclera clear  NECK: supple, thyroid normal size, non-tender, without nodularity LYMPH:  no palpable lymphadenopathy in the cervical, axillary  LUNGS: clear to auscultation and percussion with normal breathing effort HEART: regular rate & rhythm and no murmurs and no lower extremity edema ABDOMEN:abdomen soft, non-tender and normal bowel sounds Musculoskeletal:no cyanosis of digits and no clubbing  NEURO: alert & oriented x 3 with fluent speech, no focal motor/sensory deficits  LABORATORY DATA:  I have reviewed the data as listed CBC Latest Ref Rng & Units 08/23/2020  07/23/2020 06/24/2020  WBC 4.0 - 10.5 K/uL 13.2(H) 8.9 11.3(H)  Hemoglobin 12.0 - 15.0 g/dL 11.9(L) 12.0 11.0(L)  Hematocrit 36.0 - 46.0 % 36.8 37.4 34.0(L)  Platelets 150 - 400 K/uL 429(H) 362 371     CMP Latest Ref Rng & Units 07/23/2020 04/29/2020 03/15/2020  Glucose 70 - 99 mg/dL 947(S) 98 962(E)  BUN 6 - 20 mg/dL 14 14 12   Creatinine 0.44 - 1.00 mg/dL 3.66 2.94  Sodium 135 - 145 mmol/L 141 141 136  Potassium 3.5 - 5.1 mmol/L 3.5 3.6 4.1  Chloride 98 - 111 mmol/L 105 108 103  CO2 22 - 32 mmol/L 27 25 21(L)  Calcium 8.9 - 10.3 mg/dL 9.2 9.1 9.5  Total Protein 6.5 - 8.1 g/dL 6.4(L) 6.7 7.5  Total Bilirubin 0.3 - 1.2 mg/dL 0.3 0.4 7.65)  Alkaline Phos 38 - 126 U/L 64 58 74  AST 15 - 41 U/L 15 21 15   ALT 0 - 44 U/L 14 23 22       RADIOGRAPHIC STUDIES: I have personally reviewed the radiological images as listed and agreed with the findings in the report. No results found.   ASSESSMENT & PLAN:  Hang Ammon is a 45 y.o. female with    1. RecurrentrefractoryITP -She has a history of recurrentand refractoryITP. She is s/p splenectomy and treated withsteroid, IVIG,NPlate,Rituxanand chemoin the past (the first episode) dueto refractory  disease.  -She has had repeated ITP Flares with occurrences in 02/2018, 08/04/19, 08/2019 and 09/2019. 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  -Due to her frequent recurrenceof ITP,sherecently completed 4 doses ofRituxan 10/16/19-11/06/19. -We have heldNplatesince 7/26/21due to thrombosis, may consider if plt drops <5K -Will periodicallyuse of steroids (decadron) and IVIG for acute flare -She was treated with weekly rituximab 4 times in July and August 2021.. It has been normal since then. She responded very well. She is currently on close observation.  -She is clinically doing well form ITP standpoint. Labs reviewed, Hg 11.9, plt 429K, otherwise normal. Physical exam unremarkable.  -Will  continue to monitor with labs in 3 months. F/u in 6 months.   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 -She hadsubmassive bilateral PEon7/26/21CTAduring her hospitalization for ITP flare. She wasDischarged on 8/2/21on Coumadin 7.5 mg daily.Dose adjusted as indicated.  -She was switched to Xarelto 3 weeks ago. She has been tolerating well, will continue   3. Rectal bleeding, likely secondary to hemorrhoids -She reports rectal bleeding that began in December and has become daily within the last two months. -She also has heavy periods that last a week and require her to change her pad every 1-2 hours. -She will proceed with colonoscopy with Dr Myrtie Neitheranis on 09/09/20. She has been constipated lately and uses OTC   4.History of small foci ofsubarachnoid hemorrhage,Secondary to #1 -recovered well overall.Will continue to monitor. -Multiple head CTs have been stable lately  5.Headaches, Chronic dizziness, Hypotension, Recent Fall  -ChronicHA, since 03/2019 hospitalization. She has been seen Dr. Barbaraann CaoVaslow who felt symptoms were consistent with occipital neuralgia.Headache resolved.  -She did have a fall recently (early 2022) due to leg weakness. She notes dizziness is now occasional. Will monitor. If fall recurs, I will refer her back to Dr Barbaraann CaoVaslow.   6. COVID19 (+) in early 04/2019, treated monoclonial antibodies. S/p COVID vaccine series, New onset cough  -She notes for the past 2 weeks (from 08/23/20) having cough with yellow phlegm and nasal congestion. This cough has effected her sleep. -Lungs clear on exam today. I recommend COVID testing at local pharmacy. I will call in Flonase and Augmentin. I also recommend chest Xray for evaluation for other URI. She is agreeable (08/23/20)  7. Financial Support -She pays out of pocket but submits her bills and receives pay  assistance. -Her application for Medicaid was denied.She does not have orange card currently.She was approved for drug assistance grant.Blake DivineShauna will f/u with her.  8. Depression -Has tried citalopram and sertraline in the past.She is no longer on Cymbaltadue to worsened depression.I started her onCelexa (11/05/19).Mood stable.    PLAN: -[facility]  -I called in Augmentin and Flonase -Continue Xarelto -Lab in 3 and 6 months  -F/u in 6 months     No problem-specific Assessment & Plan notes found for this encounter.   Orders Placed This Encounter  Procedures  . DG Chest 2 View    Standing Status:   Future    Number of Occurrences:   1    Standing Expiration Date:   08/23/2021    Order Specific Question:   Reason for Exam (SYMPTOM  OR DIAGNOSIS REQUIRED)    Answer:   productive cough for 2 weeks    Order Specific Question:   Is patient pregnant?    Answer:   No    Order Specific Question:  Preferred imaging location?    Answer:   [facility]   All questions were answered. The patient knows to call the clinic with any problems, questions or concerns. No barriers to learning was detected. The total time spent in the appointment was 30 minutes.     Malachy Mood, MD 08/23/2020   I, Delphina Cahill, am acting as scribe for Malachy Mood, MD.   I have reviewed the above documentation for accuracy and completeness, and I agree with the above.

## 2020-08-23 ENCOUNTER — Encounter: Payer: Self-pay | Admitting: Hematology

## 2020-08-23 ENCOUNTER — Inpatient Hospital Stay: Payer: No Typology Code available for payment source | Attending: Nurse Practitioner

## 2020-08-23 ENCOUNTER — Ambulatory Visit (HOSPITAL_COMMUNITY)
Admission: RE | Admit: 2020-08-23 | Discharge: 2020-08-23 | Disposition: A | Discharge status: Home / Self Care | Payer: No Typology Code available for payment source | Source: Ambulatory Visit | Attending: Hematology | Admitting: Hematology

## 2020-08-23 ENCOUNTER — Telehealth: Payer: Self-pay | Admitting: Hematology

## 2020-08-23 ENCOUNTER — Inpatient Hospital Stay (HOSPITAL_BASED_OUTPATIENT_CLINIC_OR_DEPARTMENT_OTHER): Payer: No Typology Code available for payment source | Admitting: Hematology

## 2020-08-23 ENCOUNTER — Other Ambulatory Visit: Payer: Self-pay

## 2020-08-23 VITALS — BP 111/72 | HR 89 | Temp 97.6°F | Resp 18 | Ht 58.8 in | Wt 244.6 lb

## 2020-08-23 DIAGNOSIS — R059 Cough, unspecified: Secondary | ICD-10-CM

## 2020-08-23 DIAGNOSIS — Z7901 Long term (current) use of anticoagulants: Secondary | ICD-10-CM | POA: Insufficient documentation

## 2020-08-23 DIAGNOSIS — Z86718 Personal history of other venous thrombosis and embolism: Secondary | ICD-10-CM | POA: Insufficient documentation

## 2020-08-23 DIAGNOSIS — D693 Immune thrombocytopenic purpura: Secondary | ICD-10-CM | POA: Insufficient documentation

## 2020-08-23 DIAGNOSIS — Z79899 Other long term (current) drug therapy: Secondary | ICD-10-CM | POA: Insufficient documentation

## 2020-08-23 LAB — CBC WITH DIFFERENTIAL (CANCER CENTER ONLY)
Abs Immature Granulocytes: 0.08 10*3/uL — ABNORMAL HIGH (ref 0.00–0.07)
Basophils Absolute: 0.1 10*3/uL (ref 0.0–0.1)
Basophils Relative: 1 %
Eosinophils Absolute: 0.3 10*3/uL (ref 0.0–0.5)
Eosinophils Relative: 2 %
HCT: 36.8 % (ref 36.0–46.0)
Hemoglobin: 11.9 g/dL — ABNORMAL LOW (ref 12.0–15.0)
Immature Granulocytes: 1 %
Lymphocytes Relative: 22 %
Lymphs Abs: 2.9 10*3/uL (ref 0.7–4.0)
MCH: 28.6 pg (ref 26.0–34.0)
MCHC: 32.3 g/dL (ref 30.0–36.0)
MCV: 88.5 fL (ref 80.0–100.0)
Monocytes Absolute: 1.3 10*3/uL — ABNORMAL HIGH (ref 0.1–1.0)
Monocytes Relative: 10 %
Neutro Abs: 8.6 10*3/uL — ABNORMAL HIGH (ref 1.7–7.7)
Neutrophils Relative %: 64 %
Platelet Count: 429 10*3/uL — ABNORMAL HIGH (ref 150–400)
RBC: 4.16 MIL/uL (ref 3.87–5.11)
RDW: 14.4 % (ref 11.5–15.5)
WBC Count: 13.2 10*3/uL — ABNORMAL HIGH (ref 4.0–10.5)
nRBC: 0 % (ref 0.0–0.2)

## 2020-08-23 MED ORDER — AMOXICILLIN-POT CLAVULANATE 875-125 MG PO TABS: 1.0000 | ORAL_TABLET | Freq: Two times a day (BID) | ORAL | 0 refills | Status: DC

## 2020-08-23 MED ORDER — FLUTICASONE PROPIONATE 50 MCG/ACT NA SUSP: 1.0000 | Freq: Every day | NASAL | 1 refills | Status: DC

## 2020-08-23 NOTE — Telephone Encounter (Signed)
Scheduled per 06/06 los, patient received updated  [place].

## 2020-09-01 NOTE — Telephone Encounter (Signed)
Left voicemail for patient to call the office 

## 2020-09-01 NOTE — Telephone Encounter (Signed)
[place], Eugenio Hoes, RN  Kassius Battiste, [place], CMA [place],   I called a couple of times and was on hold for over 10 minutes and I didn't know your last name.   Dr Mosetta Putt recommends she hold her [place]amp for 3 days prior to colonoscopy and restart when GI feels its safe.  If they do not do any biopsies she would be able to restart the next day.   Luretha Rued

## 2020-09-02 NOTE — Telephone Encounter (Signed)
Left voicemail for patient to call back. 

## 2020-09-06 ENCOUNTER — Other Ambulatory Visit (HOSPITAL_COMMUNITY): Payer: No Typology Code available for payment source

## 2020-09-06 NOTE — Telephone Encounter (Signed)
Left voicemail a patient's daughter's phone to call the office

## 2020-09-07 ENCOUNTER — Telehealth: Payer: Self-pay | Admitting: Physician Assistant

## 2020-09-07 ENCOUNTER — Other Ambulatory Visit: Payer: Self-pay

## 2020-09-07 NOTE — Telephone Encounter (Signed)
Inbound call from patient asking if we have Plenvu samples or if prescription for prep can be sent to pharmacy.  Please advise.

## 2020-09-07 NOTE — Telephone Encounter (Signed)
Patient has been advised a sample would be at the front desk

## 2020-09-07 NOTE — Telephone Encounter (Signed)
Patient has been advised to stop her Xarelto starting today.

## 2020-09-09 ENCOUNTER — Ambulatory Visit (HOSPITAL_COMMUNITY): Payer: No Typology Code available for payment source | Admitting: Certified Registered Nurse Anesthetist

## 2020-09-09 ENCOUNTER — Encounter (HOSPITAL_COMMUNITY)
Admission: RE | Disposition: A | Discharge status: Home / Self Care | Payer: Self-pay | Source: Home / Self Care | Attending: Gastroenterology

## 2020-09-09 ENCOUNTER — Ambulatory Visit (HOSPITAL_COMMUNITY)
Admission: RE | Admit: 2020-09-09 | Discharge: 2020-09-09 | Disposition: A | Discharge status: Home / Self Care | Payer: No Typology Code available for payment source | Attending: Gastroenterology | Admitting: Gastroenterology

## 2020-09-09 ENCOUNTER — Other Ambulatory Visit: Payer: Self-pay

## 2020-09-09 ENCOUNTER — Encounter (HOSPITAL_COMMUNITY): Payer: Self-pay | Admitting: Gastroenterology

## 2020-09-09 DIAGNOSIS — K5909 Other constipation: Secondary | ICD-10-CM | POA: Insufficient documentation

## 2020-09-09 DIAGNOSIS — R109 Unspecified abdominal pain: Secondary | ICD-10-CM

## 2020-09-09 DIAGNOSIS — R1032 Left lower quadrant pain: Secondary | ICD-10-CM | POA: Insufficient documentation

## 2020-09-09 DIAGNOSIS — K5900 Constipation, unspecified: Secondary | ICD-10-CM

## 2020-09-09 DIAGNOSIS — K625 Hemorrhage of anus and rectum: Secondary | ICD-10-CM | POA: Insufficient documentation

## 2020-09-09 DIAGNOSIS — Z7901 Long term (current) use of anticoagulants: Secondary | ICD-10-CM

## 2020-09-09 DIAGNOSIS — Q438 Other specified congenital malformations of intestine: Secondary | ICD-10-CM | POA: Insufficient documentation

## 2020-09-09 HISTORY — PX: COLONOSCOPY WITH PROPOFOL: SHX5780

## 2020-09-09 SURGERY — COLONOSCOPY WITH PROPOFOL
Anesthesia: Monitor Anesthesia Care

## 2020-09-09 MED ORDER — SODIUM CHLORIDE 0.9 % IV SOLN: INTRAVENOUS | Status: DC

## 2020-09-09 MED ORDER — PROPOFOL 500 MG/50ML IV EMUL
INTRAVENOUS | Status: DC | PRN
  Administered 2020-09-09: 150 ug/kg/min via INTRAVENOUS

## 2020-09-09 MED ORDER — LACTATED RINGERS IV SOLN
INTRAVENOUS | Status: AC | PRN
  Administered 2020-09-09: 1000 mL via INTRAVENOUS

## 2020-09-09 MED ORDER — PROPOFOL 500 MG/50ML IV EMUL
INTRAVENOUS | Status: DC | PRN
  Administered 2020-09-09: 30 mg via INTRAVENOUS

## 2020-09-09 SURGICAL SUPPLY — 22 items

## 2020-09-09 NOTE — Op Note (Addendum)
[facility] Patient Name: [name] Procedure Date: 09/09/2020 MRN: [id number] Attending MD: Estill Cotta. Loletha Carrow , MD Date of Birth: [birth year] CSN: [id number] Age: 45 Admit Type: Outpatient Procedure:                Colonoscopy Indications:              Abdominal pain in the left lower quadrant, Rectal                            bleeding, Constipation Providers:                Mallie Mussel L. Loletha Carrow, MD, Jerene Pitch Person, Tyna Jaksch                            Technician Referring MD:             Truitt Merle Medicines:                Monitored Anesthesia Care Complications:            No immediate complications. Estimated Blood Loss:     Estimated blood loss: none. Procedure:                Pre-Anesthesia Assessment:                           - Prior to the procedure, a History and Physical                            was performed, and patient medications and                            allergies were reviewed. The patient's tolerance of                            previous anesthesia was also reviewed. The risks                            and benefits of the procedure and the sedation                            options and risks were discussed with the patient.                            All questions were answered, and informed consent                            was obtained. Prior Anticoagulants: The patient has                            taken Xarelto (rivaroxaban), last dose was 2 days                            prior to procedure. ASA Grade Assessment: III - A  patient with severe systemic disease. After                            reviewing the risks and benefits, the patient was                            deemed in satisfactory condition to undergo the                            procedure.                           After obtaining informed consent, the colonoscope                            was passed under direct vision. Throughout the                             procedure, the patient's blood pressure, pulse, and                            oxygen saturations were monitored continuously. The                            CF-HQ190L ([id number]) Olympus colonoscope was                            introduced through the anus and advanced to the the                            cecum, identified by appendiceal orifice and                            ileocecal valve. The colonoscopy was somewhat                            difficult due to a redundant colon and significant                            looping. Successful completion of the procedure was                            aided by using manual pressure. The patient                            tolerated the procedure well. The quality of the                            bowel preparation was good. The ileocecal valve,                            appendiceal orifice, and rectum were photographed. Scope In: 8:54:14 AM Scope Out: 9:13:41 AM Scope Withdrawal Time: 0 hours 12 minutes 31 seconds  Total Procedure Duration: 0 hours 19  minutes 27 seconds  Findings:      The perianal and digital rectal examinations were normal.      The colon (entire examined portion) was redundant.      The exam was otherwise without abnormality on direct and retroflexion       views. Impression:               - Redundant colon.                           - The examination was otherwise normal on direct                            and retroflexion views.                           - No specimens collected.                           Benign ano-rectal bleeding due to constipation. Moderate Sedation:      MAC sedation used Recommendation:           - Patient has a contact number available for                            emergencies. The signs and symptoms of potential                            delayed complications were discussed with the                            patient. Return to normal activities tomorrow.                             Written discharge instructions were provided to the                            patient.                           - Resume previous diet.                           - Resume Xarelto (rivaroxaban) at prior dose today.                           - Miralax 1 capful (17 grams) in 8 ounces of water                            PO daily.                           - Return to my office at appointment to be                            scheduled.                           -  Repeat colonoscopy in 10 years for screening                            purposes. Procedure Code(s):        --- Professional ---                           [phone], Colonoscopy, flexible; diagnostic, including                            collection of specimen(s) by brushing or washing,                            when performed (separate procedure) Diagnosis Code(s):        --- Professional ---                           R10.32, Left lower quadrant pain                           K62.5, Hemorrhage of anus and rectum                           K59.00, Constipation, unspecified                           Q43.8, Other specified congenital malformations of                            intestine CPT copyright 2019 American Medical Association. All rights reserved. The codes documented in this report are preliminary and upon coder review may  be revised to meet current compliance requirements. Eeshan Verbrugge L. Loletha Carrow, MD 09/09/2020 9:20:34 AM This report has been signed electronically. Number of Addenda: 0

## 2020-09-09 NOTE — Anesthesia Postprocedure Evaluation (Signed)
Anesthesia Post Note  Patient: [name]  Procedure(s) Performed: COLONOSCOPY WITH PROPOFOL     Patient location during evaluation: Endoscopy Anesthesia Type: MAC Level of consciousness: awake Pain management: pain level controlled Vital Signs Assessment: post-procedure vital signs reviewed and stable Respiratory status: spontaneous breathing, nonlabored ventilation, respiratory function stable and patient connected to nasal cannula oxygen Cardiovascular status: stable and blood pressure returned to baseline Postop Assessment: no apparent nausea or vomiting Anesthetic complications: no   No notable events documented.  Last Vitals:  Vitals:   09/09/20 0920 09/09/20 0930  BP: 99/60 105/67  Pulse: 86 85  Resp: 17 (!) 24  Temp: 36.4 C   SpO2: 100% 99%    Last Pain:  Vitals:   09/09/20 0930  TempSrc:   PainSc: 0-No pain                 [name] P Rhonin Trott

## 2020-09-09 NOTE — Transfer of Care (Signed)
Immediate Anesthesia Transfer of Care Note  Patient: [name]  Procedure(s) Performed: COLONOSCOPY WITH PROPOFOL  Patient Location: PACU  Anesthesia Type:MAC  Level of Consciousness: sedated and patient cooperative  Airway & Oxygen Therapy: Patient Spontanous Breathing and Patient connected to face mask oxygen  Post-op Assessment: Report given to RN and Post -op Vital signs reviewed and stable  Post vital signs: Reviewed and stable  Last Vitals:  Vitals Value Taken Time  BP    Temp    Pulse 73 09/09/20 0920  Resp 20 09/09/20 0920  SpO2 100 % 09/09/20 0920  Vitals shown include unvalidated device data.  Last Pain: There were no vitals filed for this visit.       Complications: No notable events documented.

## 2020-09-09 NOTE — Interval H&P Note (Signed)
History and Physical Interval Note:  09/09/2020 8:45 AM  [name]  has presented today for surgery, with the diagnosis of Rectal bleeding, Left side abd pain.  The various methods of treatment have been discussed with the patient and family. After consideration of risks, benefits and other options for treatment, the patient has consented to  Procedure(s): COLONOSCOPY WITH PROPOFOL (N/A) as a surgical intervention.  The patient's history has been reviewed, patient examined, no change in status, stable for surgery.  I have reviewed the patient's chart and labs.  Questions were answered to the patient's satisfaction.     Charlie Pitter III

## 2020-09-09 NOTE — H&P (Signed)
History:  This patient presents for endoscopic testing for rectal bleeding (See 07/26/20 office note for details). Also describes chronic constipation, BM about Q3 days.  Has not been taking anything for that.  [name] Referring physician: Malachy Mood, MD  Past Medical History: 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     Past Surgical History: Past Surgical History:  Procedure Laterality Date   BREAST SURGERY  biopsy   LOWER EXTREMITY VENOGRAPHY Left 10/08/2019   Procedure: LOWER EXTREMITY VENOGRAPHY;  Surgeon: Cephus Shelling, MD;  Location: [facility] INVASIVE CV LAB;  Service: Cardiovascular;  Laterality: Left;   PERIPHERAL VASCULAR INTERVENTION Left 10/08/2019   Procedure: PERIPHERAL VASCULAR INTERVENTION;  Surgeon: Cephus Shelling, MD;  Location: [facility] INVASIVE CV LAB;  Service: Cardiovascular;  Laterality: Left;  LT COMMON ILIAC VEIN   PERIPHERAL VASCULAR THROMBECTOMY N/A 10/08/2019   Procedure: PERIPHERAL VASCULAR THROMBECTOMY;  Surgeon: Cephus Shelling, MD;  Location: [facility] INVASIVE CV LAB;  Service: Cardiovascular;  Laterality: N/A;   SPLENECTOMY, TOTAL N/A 09/16/2015   Procedure: OPEN SPLENECTOMY;  Surgeon: Chevis Pretty III, MD;  Location: [facility] OR;  Service: General;  Laterality: N/A;    Allergies: No Known Allergies  Outpatient Meds: Current Facility-Administered Medications  Medication Dose Route Frequency Provider Last Rate Last Admin   0.9 %  sodium chloride infusion   Intravenous Continuous Ester[place], Amy S, PA-C       lactated ringers infusion    Continuous PRN Charlie Pitter III, MD 10 mL/hr at 09/09/20 0830 1,000 mL at 09/09/20 0830      ___________________________________________________________________ Objective   Exam:  Ht 4' (1.219  m)   Wt 110 kg   LMP 08/15/2020 Comment: pt unable to urinate for test, she stated there is no way pregnant  BMI 74.00 kg/m  (The recorded height is not accurate, thus BMI not correct)  Seen with Spanish interopreter CV: RRR without murmur, S1/S2, no JVD, no peripheral edema Resp: clear to auscultation bilaterally, normal RR and effort noted GI: soft, no tenderness, with active bowel sounds. No guarding or palpable organomegaly noted.Limited by body habitus Neuro: awake, alert and oriented x 3. Normal gross motor function and fluent speech   Assessment:  Rectal bleeding, constipation  Plan:  colonoscopy   Charlie Pitter III

## 2020-09-09 NOTE — Discharge Instructions (Addendum)
YOU HAD AN ENDOSCOPIC PROCEDURE TODAY: Refer to the procedure report and other information in the discharge instructions given to you for any specific questions about what was found during the examination. If this information does not answer your questions, please call [place] office at [phone] to clarify.   YOU SHOULD EXPECT: Some feelings of bloating in the abdomen. Passage of more gas than usual. Walking can help get rid of the air that was put into your GI tract during the procedure and reduce the bloating. If you had a lower endoscopy (such as a colonoscopy or flexible sigmoidoscopy) you may notice spotting of blood in your stool or on the toilet paper. Some abdominal soreness may be present for a day or two, also.  DIET: Your first meal following the procedure should be a light meal and then it is ok to progress to your normal diet. A half-sandwich or bowl of soup is an example of a good first meal. Heavy or fried foods are harder to digest and may make you feel nauseous or bloated. Drink plenty of fluids but you should avoid alcoholic beverages for 24 hours. If you had a esophageal dilation, please see attached instructions for diet.    ACTIVITY: Your care partner should take you home directly after the procedure. You should plan to take it easy, moving slowly for the rest of the day. You can resume normal activity the day after the procedure however YOU SHOULD NOT DRIVE, use power tools, machinery or perform tasks that involve climbing or major physical exertion for 24 hours (because of the sedation medicines used during the test).   SYMPTOMS TO REPORT IMMEDIATELY: A gastroenterologist can be reached at any hour. Please call [phone]  for any of the following symptoms:  Following lower endoscopy (colonoscopy, flexible sigmoidoscopy) Excessive amounts of blood in the stool  Significant tenderness, worsening of abdominal pains  Swelling of the abdomen that is new, acute  Fever of 100 or  higher   FOLLOW UP:  If any biopsies were taken you will be contacted by phone or by letter within the next 1-3 weeks. Call [phone]  if you have not heard about the biopsies in 3 weeks.  Please also call with any specific questions about appointments or follow up tests.   _______________________________________________   Resume your Xarelto (rivaroxaban) at regular dose today  Start taking miralax powder - one capful in a large glass of water daily to help relieve constipation.  ______________________________________________________________________

## 2020-09-09 NOTE — Anesthesia Preprocedure Evaluation (Addendum)
Anesthesia Evaluation  Patient identified by MRN, date of birth, ID band Patient awake    Reviewed: Allergy & Precautions, NPO status , Patient's Chart, lab work & pertinent test results  Airway Mallampati: III  TM Distance: >3 FB Neck ROM: Full    Dental no notable dental hx.    Pulmonary neg pulmonary ROS,    Pulmonary exam normal breath sounds clear to auscultation       Cardiovascular negative cardio ROS Normal cardiovascular exam Rhythm:Regular Rate:Normal  ECG: SR, rate 75   Neuro/Psych  Headaches, PSYCHIATRIC DISORDERS Depression    GI/Hepatic negative GI ROS, (+) Hepatitis -  Endo/Other  Morbid obesity  Renal/GU negative Renal ROS     Musculoskeletal negative musculoskeletal ROS (+)   Abdominal (+) + obese,   Peds  Hematology negative hematology ROS (+)   Anesthesia Other Findings Rectal bleeding Left side abd pain  Reproductive/Obstetrics                            Anesthesia Physical Anesthesia Plan  ASA: 4  Anesthesia Plan: MAC   Post-op Pain Management:    Induction: Intravenous  PONV Risk Score and Plan: 2 and Propofol infusion and Treatment may vary due to age or medical condition  Airway Management Planned: Simple Face Mask  Additional Equipment:   Intra-op Plan:   Post-operative Plan:   Informed Consent: I have reviewed the patients History and Physical, chart, labs and discussed the procedure including the risks, benefits and alternatives for the proposed anesthesia with the patient or authorized representative who has indicated his/her understanding and acceptance.     Dental advisory given and Interpreter used for interveiw  Plan Discussed with: CRNA  Anesthesia Plan Comments:         Anesthesia Quick Evaluation

## 2020-11-23 ENCOUNTER — Other Ambulatory Visit: Payer: No Typology Code available for payment source

## 2021-02-21 ENCOUNTER — Inpatient Hospital Stay: Payer: Self-pay

## 2021-02-21 ENCOUNTER — Inpatient Hospital Stay (HOSPITAL_BASED_OUTPATIENT_CLINIC_OR_DEPARTMENT_OTHER): Payer: Self-pay | Admitting: Hematology

## 2021-02-21 ENCOUNTER — Encounter: Payer: Self-pay | Admitting: Hematology

## 2021-02-21 ENCOUNTER — Inpatient Hospital Stay: Payer: Self-pay | Attending: Hematology

## 2021-02-21 ENCOUNTER — Other Ambulatory Visit: Payer: Self-pay

## 2021-02-21 VITALS — BP 101/41 | HR 65 | Temp 98.1°F | Resp 17 | Ht <= 58 in | Wt 251.0 lb

## 2021-02-21 DIAGNOSIS — D693 Immune thrombocytopenic purpura: Secondary | ICD-10-CM | POA: Insufficient documentation

## 2021-02-21 DIAGNOSIS — M5432 Sciatica, left side: Secondary | ICD-10-CM | POA: Insufficient documentation

## 2021-02-21 DIAGNOSIS — Z23 Encounter for immunization: Secondary | ICD-10-CM | POA: Insufficient documentation

## 2021-02-21 DIAGNOSIS — Z86718 Personal history of other venous thrombosis and embolism: Secondary | ICD-10-CM | POA: Insufficient documentation

## 2021-02-21 DIAGNOSIS — R519 Headache, unspecified: Secondary | ICD-10-CM | POA: Insufficient documentation

## 2021-02-21 DIAGNOSIS — R42 Dizziness and giddiness: Secondary | ICD-10-CM | POA: Insufficient documentation

## 2021-02-21 DIAGNOSIS — Z86711 Personal history of pulmonary embolism: Secondary | ICD-10-CM | POA: Insufficient documentation

## 2021-02-21 DIAGNOSIS — F32A Depression, unspecified: Secondary | ICD-10-CM | POA: Insufficient documentation

## 2021-02-21 DIAGNOSIS — I82402 Acute embolism and thrombosis of unspecified deep veins of left lower extremity: Secondary | ICD-10-CM

## 2021-02-21 DIAGNOSIS — Z7901 Long term (current) use of anticoagulants: Secondary | ICD-10-CM | POA: Insufficient documentation

## 2021-02-21 LAB — CBC WITH DIFFERENTIAL (CANCER CENTER ONLY)
Abs Immature Granulocytes: 0.05 10*3/uL (ref 0.00–0.07)
Basophils Absolute: 0.1 10*3/uL (ref 0.0–0.1)
Basophils Relative: 1 %
Eosinophils Absolute: 0.4 10*3/uL (ref 0.0–0.5)
Eosinophils Relative: 4 %
HCT: 36.6 % (ref 36.0–46.0)
Hemoglobin: 12.1 g/dL (ref 12.0–15.0)
Immature Granulocytes: 1 %
Lymphocytes Relative: 28 %
Lymphs Abs: 2.7 10*3/uL (ref 0.7–4.0)
MCH: 28.9 pg (ref 26.0–34.0)
MCHC: 33.1 g/dL (ref 30.0–36.0)
MCV: 87.4 fL (ref 80.0–100.0)
Monocytes Absolute: 1 10*3/uL (ref 0.1–1.0)
Monocytes Relative: 10 %
Neutro Abs: 5.5 10*3/uL (ref 1.7–7.7)
Neutrophils Relative %: 56 %
Platelet Count: 427 10*3/uL — ABNORMAL HIGH (ref 150–400)
RBC: 4.19 MIL/uL (ref 3.87–5.11)
RDW: 14.7 % (ref 11.5–15.5)
WBC Count: 9.6 10*3/uL (ref 4.0–10.5)
nRBC: 0 % (ref 0.0–0.2)

## 2021-02-21 LAB — CMP (CANCER CENTER ONLY)
ALT: 18 U/L (ref 0–44)
AST: 15 U/L (ref 15–41)
Albumin: 3.6 g/dL (ref 3.5–5.0)
Alkaline Phosphatase: 79 U/L (ref 38–126)
Anion gap: 8 (ref 5–15)
BUN: 17 mg/dL (ref 6–20)
CO2: 23 mmol/L (ref 22–32)
Calcium: 9 mg/dL (ref 8.9–10.3)
Chloride: 105 mmol/L (ref 98–111)
Creatinine: 0.78 mg/dL (ref 0.44–1.00)
GFR, Estimated: >60 mL/min (ref >60)
Glucose, Bld: 119 mg/dL — ABNORMAL HIGH (ref 70–99)
Potassium: 4.1 mmol/L (ref 3.5–5.1)
Sodium: 136 mmol/L (ref 135–145)
Total Bilirubin: <0.2 mg/dL — ABNORMAL LOW (ref 0.3–1.2)
Total Protein: 7.2 g/dL (ref 6.5–8.1)

## 2021-02-21 MED ORDER — METHOCARBAMOL 500 MG PO TABS
500.0000 mg | ORAL_TABLET | Freq: Three times a day (TID) | ORAL | 0 refills | Status: DC | PRN
Start: 2021-02-21 — End: 2023-09-15

## 2021-02-21 MED ORDER — RIVAROXABAN 20 MG PO TABS: 20.0000 mg | ORAL_TABLET | Freq: Every day | ORAL | 6 refills | Status: DC

## 2021-02-21 MED ORDER — INFLUENZA VAC SPLIT QUAD 0.5 ML IM SUSY
0.5000 mL | PREFILLED_SYRINGE | Freq: Once | INTRAMUSCULAR | Status: AC
Start: 2021-02-21 — End: 2021-02-21
  Administered 2021-02-21: 0.5 mL via INTRAMUSCULAR
  Filled 2021-02-21: qty 0.5

## 2021-02-21 NOTE — Progress Notes (Signed)
[name]   [phone] [phone]   Clinic Follow up Note   Patient Care Team: Truitt Merle, MD as PCP - General (Hematology) Annia Belt, MD as Consulting Physician (Oncology) Stanford Breed (Hematology and Oncology) Danice Goltz, MD as Consulting Physician (Ophthalmology) Annie Main, MD (Internal Medicine)  Date of Service:  02/21/2021  CHIEF COMPLAINT: f/u of refractory ITP, acute submassive bilateral PE, left DVT   CURRENT THERAPY:  -Promacta 25mg  daily starting 10/22/18. Stopped 04/08/18 due to recurrent flares.  -Nplate q2weeks starting in 2 weeks. Increased to weekly on 08/11/19 due to recurrent ITP flares. Will give her 2 mcg/kg if platelet normal, and increase to 3 mcg/kg if platelet less than 150K. Increased to 6mcg/kg starting 09/03/19. Increased to 25mcg/kg on 09/10/19 and increase again to 19mcg/kg starting 09/17/19. Due to DVT and PE, holding Nplate since 579FGE due to DVT unless plt <5K -Periodic use of steroids (decadron) and IVIG  -Rituxan weekly X4, completed 11/03/2019 -Coumadin starting 10/15/19, changed to 7.5mg  on Mondays, 5mg  daily for the rest of week on 12/04/2019  ASSESSMENT & PLAN:  [name] is a 45 y.o. female with   1. Recurrent refractory ITP -She has a history of recurrent and refractory ITP. She is s/p splenectomy and treated with steroid, IVIG, NPlate, Rituxan and chemo in the past (the first episode) due to refractory disease.  -She has had repeated ITP Flares with occurrences in 02/2018, 08/04/19, 08/2019 and 09/2019. 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  -Due to her frequent recurrence of ITP, she recently completed 4 doses of Rituxan 10/16/19-11/06/19. -We have held Nplate since 579FGE due to thrombosis, may consider if plt drops <5K -Will periodically use of steroids (decadron) and IVIG for acute flare  -She was treated with weekly rituximab 4 times in July  and August 2021..  It has been normal since then. She responded very well. She is currently on close observation.  -She is clinically doing well form ITP standpoint. Labs reviewed, plt 427K, which is stable. Physical exam unremarkable.  -Given stability in plt count, will see her back in 1 year.   2. 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 She was bridged with Lovenox and discharged 10/10/19 on Coumadin.   -She had submassive bilateral PE on 10/13/19 CTA during her hospitalization for ITP flare. She was Discharged on 10/20/19 on Coumadin 7.5 mg daily. Dose adjusted as indicated.  -She was switched to Xarelto in 06/2020. She has been tolerating well, will continue. I refilled today.   3. Rectal bleeding -She reports rectal bleeding that began in December 2021 -She had colonoscopy with Dr Loletha Carrow on 09/09/20 showing benign rectal bleeding from constipation. -resolved   4. History of small foci of subarachnoid hemorrhage, Secondary to #1 -recovered well overall. Will continue to monitor.  -Multiple head CTs have been stable lately    5. Headaches, Chronic dizziness, Hypotension, Left sciatic pain -Chronic HA, since 03/2019 hospitalization. She has been seen Dr. Mickeal Skinner who felt symptoms were consistent with occipital neuralgia. Headache resolved.  -She did have a fall in early 2022 due to leg weakness and has left sciatic pain. She was previously prescribed Robaxin by PA Lucianne Lei in our [facility] in 05/2020. She requested a refill today. She knows to use only as needed. -she reports dizziness from low blood pressure. I advised her to f/u with her PCP   6. COVID19 (+) in  early 04/2019, treated monoclonial antibodies. S/p COVID vaccine series -she had a productive cough in 08/2020. Chest x-ray was negative. -she reports more fatigue since her infection.   7. Financial Support -She pays out of pocket but submits her bills and receives pay assistance.  -Her  application for Medicaid was denied. She does not have orange card currently. She was approved for drug assistance grant. Blake Divine will f/u with her.   8. Depression -Has tried citalopram and sertraline in the past. She is no longer on Cymbalta due to worsened depression. I started her on Celexa (11/05/19). Mood stable.      PLAN: -I refilled Xarelto and Robaxin today -lab and f/u in 1 year  -I encourage her to f/u with PCP for left upper chest pain and orthostatic hypotension and GI for left abdominal pain    No problem-specific Assessment & Plan notes found for this encounter.   INTERVAL HISTORY:  [name] is here for a follow up of ITP. She was last seen by me on 08/23/20. She presents to the clinic accompanied by her daughter and an interpreter. She reports she is doing well, but she reports a pain just above her breast that extends around to her back when she breathes deeply. She adds that it feels like something is going to "pop." She reports she has had this pain for the last two months. She notes she is scheduled for follow up with her PCP. She also reports pain to the inner leg/groin area of her right leg and bilateral leg weakness.  She also reports she experiences more fatigue since having Covid.   All other systems were reviewed with the patient and are negative.  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    SURGICAL HISTORY: Past Surgical History:  Procedure Laterality Date   BREAST SURGERY  biopsy   COLONOSCOPY WITH PROPOFOL N/A 09/09/2020   Procedure: COLONOSCOPY WITH PROPOFOL;  Surgeon: Sherrilyn Rist, MD;  Location: [facility];  Service: Gastroenterology;  Laterality: N/A;   LOWER EXTREMITY VENOGRAPHY Left 10/08/2019    Procedure: LOWER EXTREMITY VENOGRAPHY;  Surgeon: Cephus Shelling, MD;  Location: [facility] INVASIVE CV LAB;  Service: Cardiovascular;  Laterality: Left;   PERIPHERAL VASCULAR INTERVENTION Left 10/08/2019   Procedure: PERIPHERAL VASCULAR INTERVENTION;  Surgeon: Cephus Shelling, MD;  Location: [facility] INVAS[facility] CV LAB;  Service: Cardiovascular;  Laterality: Left;  LT COMMON ILIAC VEIN   PERIPHERAL VASCULAR THROMBECTOMY N/A 10/08/2019   Procedure: PERIPHERAL VASCULAR THROMBECTOMY;  Surgeon: Cephus Shelling, MD;  Location: [facility]VASIVE CV LAB;  Service: Cardiovascular;  Laterality: N/A;   SPLENECTOMY, TOTAL N/A 09/16/2015   Procedure: OPEN SPLENECTOMY;  Surgeon: Chevis Pretty III, MD;  Location: [facility] OR;  Service: General;  Laterality: N/A;    I have reviewed the social history and family history with the patient and they are unchanged from previous note.  ALLERGIES:  has No Known Allergies.  MEDICATIONS:  Current Outpatient Medications  Medication Sig Dispense Refill   fluticasone (FLONASE) 50 MCG/ACT nasal spray Place 1 spray into both nostrils daily. (Patient not taking: Reported on 09/01/2020) 15.8 mL 1   methocarbamol (ROBAXIN) 500 MG tablet Take 1 tablet (500 mg total) by mouth every 8 (eight) hours as  needed for muscle spasms. 30 tablet 0   rivaroxaban (XARELTO) 20 MG TABS tablet Take 1 tablet (20 mg total) by mouth daily with supper. 30 tablet 6   No current facility-administered medications for this visit.    PHYSICAL EXAMINATION: ECOG PERFORMANCE STATUS: 1 - Symptomatic but completely ambulatory  Vitals:   02/21/21 0856 02/21/21 0920  BP: (!) 101/41   Pulse: 65   Resp: 17   Temp: 98.1 F (36.7 C)   SpO2: 99% 96%   Wt Readings from Last 3 Encounters:  02/21/21 251 lb (113.9 kg)  09/07/20 242 lb 8.1 oz (110 kg)  08/23/20 244 lb 9.6 oz (110.9 kg)     GENERAL:alert, no distress and comfortable SKIN: skin color, texture, turgor are normal, no rashes or significant lesions EYES:  normal, Conjunctiva are pink and non-injected, sclera clear  NECK: supple, thyroid normal size, non-tender, without nodularity LYMPH:  no palpable lymphadenopathy in the cervical, axillary  LUNGS: clear to auscultation and percussion with normal breathing effort HEART: regular rate & rhythm and no murmurs and no lower extremity edema ABDOMEN:abdomen soft, non-tender and normal bowel sounds Musculoskeletal:no cyanosis of digits and no clubbing  NEURO: alert & oriented x 3 with fluent speech, no focal motor/sensory deficits  LABORATORY DATA:  I have reviewed the data as listed CBC Latest Ref Rng & Units 02/21/2021 08/23/2020 07/23/2020  WBC 4.0 - 10.5 K/uL 9.6 13.2(H) 8.9  Hemoglobin 12.0 - 15.0 g/dL 12.1 11.9(L) 12.0  Hematocrit 36.0 - 46.0 % 36.6 36.8 37.4  Platelets 150 - 400 K/uL 427(H) 429(H) 362     CMP Latest Ref Rng & Units 02/21/2021 07/23/2020 04/29/2020  Glucose 70 - 99 mg/dL 119(H) 109(H) 98  BUN 6 - 20 mg/dL 17 14 14   Creatinine 0.44 - 1.00 mg/dL 0.78 0.69 0.63  Sodium 135 - 145 mmol/L 136 141 141  Potassium 3.5 - 5.1 mmol/L 4.1 3.5 3.6  Chloride 98 - 111 mmol/L 105 105 108  CO2 22 - 32 mmol/L 23 27 25   Calcium 8.9 - 10.3 mg/dL 9.0 9.2 9.1  Total Protein 6.5 - 8.1 g/dL 7.2 6.4(L) 6.7  Total Bilirubin 0.3 - 1.2 mg/dL <0.2(L) 0.3 0.4  Alkaline Phos 38 - 126 U/L 79 64 58  AST 15 - 41 U/L 15 15 21   ALT 0 - 44 U/L 18 14 23       RADIOGRAPHIC STUDIES: I have personally reviewed the radiological images as listed and agreed with the findings in the report. No results found.    No orders of the defined types were placed in this encounter.  All questions were answered. The patient knows to call the clinic with any problems, questions or concerns. No barriers to learning was detected. The total time spent in the appointment was 25 minutes.     Truitt Merle, MD 02/21/2021   I, Wilburn Mylar, am acting as scribe for Truitt Merle, MD.   I have reviewed the above documentation for  accuracy and completeness, and I agree with the above.

## 2021-02-21 NOTE — Patient Instructions (Signed)
Influenza Virus Vaccine injection (Fluarix) Qu es este medicamento? La VACUNA ANTIGRIPAL ayuda a disminuir el riesgo de contraer la influenza, tambin conocida como la gripe. La vacuna solo ayuda a protegerle contra algunas cepas de influenza. Esta vacuna no ayuda a reducir el riesgo de contraer influenza pandmica H1N1. Este medicamento puede ser utilizado para otros usos; si tiene alguna pregunta consulte con su proveedor de atencin mdica o con su farmacutico. MARCAS COMUNES: Fluarix, Fluzone Qu le debo informar a mi profesional de la salud antes de tomar este medicamento? Necesita saber si usted presenta alguno de los siguientes problemas o situaciones: trastorno de sangrado como hemofilia fiebre o infeccin sndrome de Guillain-Barre u otros problemas neurolgicos problemas del sistema inmunolgico infeccin por el virus de la inmunodeficiencia humana (VIH) o SIDA niveles bajos de plaquetas en la sangre esclerosis mltiple una reaccin alrgica o inusual a las vacunas antigripales, a los huevos, protenas de pollo, al ltex, a la gentamicina, a otros medicamentos, alimentos, colorantes o conservantes si est embarazada o buscando quedar embarazada si est amamantando a un beb Cmo debo utilizar este medicamento? Esta vacuna se administra mediante inyeccin por va intramuscular. Lo administra un profesional de la salud. Recibir una copia de informacin escrita sobre la vacuna antes de cada vacuna. Asegrese de leer este folleto cada vez cuidadosamente. Este folleto puede cambiar con frecuencia. Hable con su pediatra para informarse acerca del uso de este medicamento en nios. Puede requerir atencin especial. Sobredosis: Pngase en contacto inmediatamente con un centro toxicolgico o una sala de urgencia si usted cree que haya tomado demasiado medicamento. ATENCIN: Este medicamento es solo para usted. No comparta este medicamento con nadie. Qu sucede si me olvido de una  dosis? No se aplica en este caso. Qu puede interactuar con este medicamento? quimioterapia o radioterapia medicamentos que suprimen el sistema inmunolgico, tales como etanercept, anakinra, infliximab y adalimumab medicamentos que tratan o previenen cogulos sanguneos, como warfarina fenitona medicamentos esteroideos, como la prednisona o la cortisona teofilina vacunas Puede ser que esta lista no menciona todas las posibles interacciones. Informe a su profesional de la salud de todos los productos a base de hierbas, medicamentos de venta libre o suplementos nutritivos que est tomando. Si usted fuma, consume bebidas alcohlicas o si utiliza drogas ilegales, indqueselo tambin a su profesional de la salud. Algunas sustancias pueden interactuar con su medicamento. A qu debo estar atento al usar este medicamento? Informe a su mdico o a su profesional de la salud sobre todos los efectos secundarios que persistan despus de 3 das. Llame a su proveedor de atencin mdica si se presentan sntomas inusuales dentro de las 6 semanas posteriores a la vacunacin. Es posible que todava pueda contraer la gripe, pero la enfermedad no ser tan fuerte como normalmente. No puede contraer la gripe de esta vacuna. La vacuna antigripal no le protege contra resfros u otras enfermedades que pueden causar fiebre. Debe vacunarse cada ao. Qu efectos secundarios puedo tener al utilizar este medicamento? Efectos secundarios que debe informar a su mdico o a su profesional de la salud tan pronto como sea posible: reacciones alrgicas como erupcin cutnea, picazn o urticarias, hinchazn de la cara, labios o lengua Efectos secundarios que, por lo general, no requieren atencin mdica (debe informarlos a su mdico o a su profesional de la salud si persisten o si son molestos): fiebre dolor de cabeza molestias y dolores musculares dolor, sensibilidad, enrojecimiento o hinchazn en el lugar de la  inyeccin cansancio o debilidad Puede ser que esta lista no   menciona todos los posibles efectos secundarios. Comunquese a su mdico por asesoramiento mdico [employer]. Usted puede informar los efectos secundarios a la FDA por telfono al 1-800-FDA-1088. Dnde debo guardar mi medicina? Esta vacuna se administra solamente en clnicas, farmacias, consultorio mdico u otro consultorio de un profesional de la salud y no Teacher, early years/pre en su domicilio. ATENCIN: Este folleto es un resumen. Puede ser que no cubra toda la posible informacin. Si usted tiene preguntas acerca de esta medicina, consulte con su mdico, su farmacutico o su profesional de Radiographer, therapeutic.  2022 Elsevier/Gold Standard (2009-09-07 15:31:40)

## 2021-02-22 ENCOUNTER — Telehealth: Payer: Self-pay | Admitting: Hematology

## 2021-02-22 NOTE — Telephone Encounter (Signed)
Scheduled follow-up appointment per 12/5 los. Patient is aware. Mailed calendar.

## 2021-03-26 ENCOUNTER — Emergency Department (HOSPITAL_COMMUNITY): Payer: Self-pay

## 2021-03-26 ENCOUNTER — Other Ambulatory Visit: Payer: Self-pay

## 2021-03-26 ENCOUNTER — Encounter (HOSPITAL_COMMUNITY): Payer: Self-pay

## 2021-03-26 ENCOUNTER — Observation Stay (HOSPITAL_COMMUNITY)
Admission: EM | Admit: 2021-03-26 | Discharge: 2021-03-28 | Disposition: A | Discharge status: Home / Self Care | Payer: Self-pay | Attending: Family Medicine | Admitting: Family Medicine

## 2021-03-26 DIAGNOSIS — Z7901 Long term (current) use of anticoagulants: Secondary | ICD-10-CM | POA: Insufficient documentation

## 2021-03-26 DIAGNOSIS — R7309 Other abnormal glucose: Secondary | ICD-10-CM | POA: Insufficient documentation

## 2021-03-26 DIAGNOSIS — R519 Headache, unspecified: Principal | ICD-10-CM | POA: Diagnosis present

## 2021-03-26 DIAGNOSIS — H9190 Unspecified hearing loss, unspecified ear: Secondary | ICD-10-CM

## 2021-03-26 DIAGNOSIS — Z20822 Contact with and (suspected) exposure to covid-19: Secondary | ICD-10-CM | POA: Insufficient documentation

## 2021-03-26 DIAGNOSIS — H9311 Tinnitus, right ear: Secondary | ICD-10-CM

## 2021-03-26 LAB — DIFFERENTIAL
Abs Immature Granulocytes: 0.04 10*3/uL (ref 0.00–0.07)
Basophils Absolute: 0.1 10*3/uL (ref 0.0–0.1)
Basophils Relative: 1 %
Eosinophils Absolute: 0.2 10*3/uL (ref 0.0–0.5)
Eosinophils Relative: 2 %
Immature Granulocytes: 0 %
Lymphocytes Relative: 21 %
Lymphs Abs: 2 10*3/uL (ref 0.7–4.0)
Monocytes Absolute: 0.8 10*3/uL (ref 0.1–1.0)
Monocytes Relative: 8 %
Neutro Abs: 6.7 10*3/uL (ref 1.7–7.7)
Neutrophils Relative %: 68 %

## 2021-03-26 LAB — HIV ANTIBODY (ROUTINE TESTING W REFLEX): HIV Screen 4th Generation wRfx: NONREACTIVE

## 2021-03-26 LAB — CBC
HCT: 36.4 % (ref 36.0–46.0)
Hemoglobin: 11.8 g/dL — ABNORMAL LOW (ref 12.0–15.0)
MCH: 28.9 pg (ref 26.0–34.0)
MCHC: 32.4 g/dL (ref 30.0–36.0)
MCV: 89 fL (ref 80.0–100.0)
Platelets: 208 10*3/uL (ref 150–400)
RBC: 4.09 MIL/uL (ref 3.87–5.11)
RDW: 15 % (ref 11.5–15.5)
WBC: 9.7 10*3/uL (ref 4.0–10.5)
nRBC: 0 % (ref 0.0–0.2)

## 2021-03-26 LAB — PROTIME-INR
INR: 1.7 — ABNORMAL HIGH (ref 0.8–1.2)
Prothrombin Time: 20 seconds — ABNORMAL HIGH (ref 11.4–15.2)

## 2021-03-26 LAB — I-STAT BETA HCG BLOOD, ED (MC, WL, AP ONLY): I-stat hCG, quantitative: <5 m[IU]/mL (ref <5)

## 2021-03-26 LAB — I-STAT CHEM 8, ED
BUN: 10 mg/dL (ref 6–20)
Calcium, Ion: 1.1 mmol/L — ABNORMAL LOW (ref 1.15–1.40)
Chloride: 105 mmol/L (ref 98–111)
Creatinine, Ser: 0.4 mg/dL — ABNORMAL LOW (ref 0.44–1.00)
Glucose, Bld: 137 mg/dL — ABNORMAL HIGH (ref 70–99)
HCT: 36 % (ref 36.0–46.0)
Hemoglobin: 12.2 g/dL (ref 12.0–15.0)
Potassium: 3.9 mmol/L (ref 3.5–5.1)
Sodium: 137 mmol/L (ref 135–145)
TCO2: 23 mmol/L (ref 22–32)

## 2021-03-26 LAB — COMPREHENSIVE METABOLIC PANEL
ALT: 23 U/L (ref 0–44)
AST: 22 U/L (ref 15–41)
Albumin: 3.5 g/dL (ref 3.5–5.0)
Alkaline Phosphatase: 55 U/L (ref 38–126)
Anion gap: 9 (ref 5–15)
BUN: 9 mg/dL (ref 6–20)
CO2: 23 mmol/L (ref 22–32)
Calcium: 9.1 mg/dL (ref 8.9–10.3)
Chloride: 106 mmol/L (ref 98–111)
Creatinine, Ser: 0.52 mg/dL (ref 0.44–1.00)
GFR, Estimated: >60 mL/min (ref >60)
Glucose, Bld: 134 mg/dL — ABNORMAL HIGH (ref 70–99)
Potassium: 4 mmol/L (ref 3.5–5.1)
Sodium: 138 mmol/L (ref 135–145)
Total Bilirubin: 0.6 mg/dL (ref 0.3–1.2)
Total Protein: 6.8 g/dL (ref 6.5–8.1)

## 2021-03-26 LAB — RESP PANEL BY RT-PCR (FLU A&B, COVID) ARPGX2
Influenza A by PCR: NEGATIVE
Influenza B by PCR: NEGATIVE
SARS Coronavirus 2 by RT PCR: NEGATIVE

## 2021-03-26 LAB — APTT: aPTT: 48 seconds — ABNORMAL HIGH (ref 24–36)

## 2021-03-26 LAB — CBG MONITORING, ED: Glucose-Capillary: 123 mg/dL — ABNORMAL HIGH (ref 70–99)

## 2021-03-26 MED ORDER — ACETAMINOPHEN 325 MG PO TABS
650.0000 mg | ORAL_TABLET | Freq: Four times a day (QID) | ORAL | Status: DC | PRN
  Administered 2021-03-27 – 2021-03-28 (×5): 650 mg via ORAL
  Filled 2021-03-26 (×5): qty 2

## 2021-03-26 MED ORDER — ACETAMINOPHEN 500 MG PO TABS
1000.0000 mg | ORAL_TABLET | Freq: Once | ORAL | Status: AC
  Administered 2021-03-26: 1000 mg via ORAL
  Filled 2021-03-26: qty 2

## 2021-03-26 MED ORDER — ONDANSETRON HCL 4 MG PO TABS: 4.0000 mg | ORAL_TABLET | Freq: Three times a day (TID) | ORAL | Status: DC | PRN

## 2021-03-26 MED ORDER — POLYETHYLENE GLYCOL 3350 17 G PO PACK
17.0000 g | PACK | Freq: Every day | ORAL | Status: DC
  Administered 2021-03-26 – 2021-03-28 (×3): 17 g via ORAL
  Filled 2021-03-26 (×3): qty 1

## 2021-03-26 MED ORDER — PROCHLORPERAZINE EDISYLATE 10 MG/2ML IJ SOLN
10.0000 mg | Freq: Once | INTRAMUSCULAR | Status: AC
Start: 2021-03-26 — End: 2021-03-26
  Administered 2021-03-26: 10 mg via INTRAVENOUS
  Filled 2021-03-26: qty 2

## 2021-03-26 MED ORDER — IOHEXOL 350 MG/ML SOLN
75.0000 mL | Freq: Once | INTRAVENOUS | Status: AC | PRN
  Administered 2021-03-26: 75 mL via INTRAVENOUS

## 2021-03-26 MED ORDER — METHOCARBAMOL 500 MG PO TABS
500.0000 mg | ORAL_TABLET | Freq: Three times a day (TID) | ORAL | Status: DC | PRN
  Administered 2021-03-27 – 2021-03-28 (×4): 500 mg via ORAL
  Filled 2021-03-26 (×4): qty 1

## 2021-03-26 NOTE — ED Notes (Signed)
Up to the br 

## 2021-03-26 NOTE — ED Triage Notes (Signed)
Patient complains of 3 weeks of right sided headache and right ear throbbing. Denies any previous cold or congestion. No relief with the otc meds

## 2021-03-26 NOTE — ED Notes (Signed)
Pt transported to CT ?

## 2021-03-26 NOTE — ED Notes (Signed)
1750 UNABLE TO GIVE REPORT ADMITTING NURSE  IN A COVID ROOM

## 2021-03-26 NOTE — ED Provider Triage Note (Signed)
Emergency Medicine Provider Triage Evaluation Note  [name] , a 46 y.o. female  was evaluated in triage.  Pt complains of headache. She states that same has been going on for 3 weeks and is on the right side.  Also with ringing in the right ear.  She states that she had a SAH in 2017 and that the pain is similar to this.  History of ITP and DVT on Xarelto.  She states she has been compliant with this regimen with no missed doses.  Has been trying over-the-counter medication without relief.  Review of Systems  Positive: Headache, tinnitus Negative:   Physical Exam  BP 126/81    Pulse 90    Temp 98.8 F (37.1 C)    Resp 18    SpO2 100%  Gen:   Awake, no distress   Resp:  Normal effort MSK:   Moves extremities without difficulty  Other:  Overall neurologically intact  Medical Decision Making  Medically screening exam initiated at 11:09 AM.  Appropriate orders placed.  Sund[place] was informed that the remainder of the evaluation will be completed by another provider, this initial triage assessment does not replace that evaluation, and the importance of remaining in the ED until their evaluation is complete.     Silva Bandy, PA-C 03/26/21 1112

## 2021-03-26 NOTE — Hospital Course (Addendum)
[name] is a 46 y.o. female presenting with right sided headache for the past three weeks and tinnitus. PMH is significant for recurrent refractory ITP, SAH hx, PE/DVT on Xarelto, May Thurner syndrome, and depression   Headache   Prior Subarachnoid Hemorrhage Patient has history of 3-week right-sided headache and tinnitus that preceded the headache by a couple of days.  Given history of subarachnoid hemorrhage in the past patient received CT venogram of head and CT angio head and neck which showed no acute abnormalities. Neurology was consulted and advised admission for lumbar puncture to rule out SAH. Marland Kitchen  Results of cell count on CSF unremarkable with RBC of 109. LP was normal. Head CT and LP findings ruled out [facility]. Decreased sensation and feeling of swelling to the right face resolved by time of discharge. Patient's pain was improved at discharge.   Tinnitus Constant buzzing on R side for a little over 3 weeks. Has not had prior issues like this, ENT consulted and recommended following up outpatient (not inpatient). Patient did not complain of any hearing loss or vision changes.   Chronic conditions remained stable History of DVT/PE on Xarelto History of recurrent ITP-Hgb 11.7 and platelets 160 6K Chronic back pain-on Robaxin.  PCP Follow Up ENT outpatient follow up for tinnitis Follow for obesity/prediabetes

## 2021-03-26 NOTE — Consult Note (Signed)
Neurology Consultation  Spanish interpretor used during visit.   Reason for Consult: HA x 3 weeks with tinnitus AD.  Referring Physician: A. Rhunette CroftNanavati, MD.   CC: HA, tinnitus.   History is obtained from: Patient, chart.   HPI: [name] is a 46 y.o. 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 The tinnitus started a few days prior to HAs. She denies ever being diagnosed with MHAs. No HA in over a year. No recent viral illness. HA not accompanied by n/v or numbness or tingling. She does feel more dullness on right side of face. Also, states she has blurry vision randomly where she has to focus intensely to make out an object, person, etc. No loss of vision or double vision. Her hearing in the AD has not changed from normal for her. No weakness or a particular limb, no neck pain, no dizziness, no ataxia, no dysphagia, no aphasia, no dysarthria. States HA keeps her awake. Nothing seemed to bring the HA on. No relief with Tylenol at home.  In review of chart, last visit with hematology showed no r/p flare, Platelet count of 427K. She has been on steroids, IVIG, Rituxan and chemo in the past. CTHs have been stable in re: SAH. She has seen Dr. Barbaraann CaoVaslow for HA that he thought were consistent with occipital neuralgia. HA resolved in 2021.   Due to HA and tinnitus with hx of SAH, neurology was asked to consult.   ROS: A robust ROS was performed and is negative except as noted in the HPI.   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  Family History  Problem Relation Age of Onset    Diabetes Mother    Breast cancer Mother    Diabetes Father    Uterine cancer Sister    Breast cancer Sister    Breast cancer Sister    Breast cancer Sister    Colon cancer Neg Hx    Pancreatic cancer Neg Hx    Esophageal cancer Neg Hx    Stomach cancer Neg Hx    Liver disease Neg Hx    Social History:   reports that she has never smoked. She has never used smokeless tobacco. She reports that she does not drink alcohol and does not use drugs.  Medications No current facility-administered medications for this encounter.  Current Outpatient Medications:    fluticasone (FLONASE) 50 MCG/ACT nasal spray, Place 1 spray into both nostrils daily. (Patient not taking: Reported on 09/01/2020), Disp: 15.8 mL, Rfl: 1   methocarbamol (ROBAXIN) 500 MG tablet, Take 1 tablet (500 mg total) by mouth every 8 (eight) hours as needed for muscle spasms., Disp: 30 tablet, Rfl: 0   rivaroxaban (XARELTO) 20 MG TABS tablet, Take 1 tablet (20 mg total) by mouth daily with supper., Disp: 30 tablet, Rfl: 6  Exam: Current vital signs: BP (!) 120/109    Pulse 65    Temp 98.8 F (37.1 C)    Resp 20    SpO2 99%  Vital signs in last 24 hours:  Temp:  [98.8 F (37.1 C)] 98.8 F (37.1 C) (01/07 1030) Pulse Rate:  [65-90] 65 (01/07 1251) Resp:  [16-20] 20 (01/07 1251) BP: (104-126)/(71-109) 120/109 (01/07 1251) SpO2:  [99 %-100 %] 99 % (01/07 1251)  PE: GENERAL: Well appearing, obese Latino female in NAD. Awake, alert. HEENT: normocephalic and atraumatic. LUNGS - Normal respiratory effort.  CV - RRR on tele. ABDOMEN - Soft, nontender. Ext: warm, well perfused. Psych: affect slightly flat due to concern, but very pleasant.   NEURO:  Mental Status: Alert and oriented x4.  Speech/Language: speech is without dysarthria or aphasia.  Naming, repetition, fluency, and comprehension intact.  Cranial Nerves:  II: PERRL 86mm/brisk. visual fields full.  III, IV, VI: EOMI without nystagmus. Lid elevation symmetric  and full.  V: sensation is intact to face but duller on her right cheek area.  VII: Smile is symmetrical. Able to puff cheeks and raise eyebrows.  VIII:hearing intact to voice. IX, X: palate elevation is symmetric. Phonation normal.  XI: normal sternocleidomastoid and trapezius muscle strength. GYJ:EHUDJS is symmetrical without fasciculations.   Motor:  5/5 to all muscle groups tested.  Tone is normal. Bulk is increased.  Sensation- Intact to light touch bilaterally in all four extremities. Extinction absent to DSS.  Coordination: FTN intact bilaterally. HKS intact bilaterally. No pronator drift.  Gait- deferred.   Labs I have reviewed labs in epic and the results pertinent to this consultation are: INR 1.7  hCG < 5.   CBC    Component Value Date/Time   WBC 9.7 03/26/2021 1109   RBC 4.09 03/26/2021 1109   HGB 12.2 03/26/2021 1123   HGB 12.1 02/21/2021 0826   HCT 36.0 03/26/2021 1123   HCT 27.3 (L) 09/21/2015 0900   PLT 208 03/26/2021 1109   PLT 427 (H) 02/21/2021 0826   MCV 89.0 03/26/2021 1109   MCH 28.9 03/26/2021 1109   MCHC 32.4 03/26/2021 1109   RDW 15.0 03/26/2021 1109   LYMPHSABS 2.0 03/26/2021 1109   MONOABS 0.8 03/26/2021 1109   EOSABS 0.2 03/26/2021 1109   BASOSABS 0.1 03/26/2021 1109    CMP     Component Value Date/Time   NA 137 03/26/2021 1123   K 3.9 03/26/2021 1123   CL 105 03/26/2021 1123   CO2 23 03/26/2021 1109   GLUCOSE 137 (H) 03/26/2021 1123   BUN 10 03/26/2021 1123   CREATININE 0.40 (L) 03/26/2021 1123   CREATININE 0.78 02/21/2021 0826   CALCIUM 9.1 03/26/2021 1109   PROT 6.8 03/26/2021 1109   ALBUMIN 3.5 03/26/2021 1109   AST 22 03/26/2021 1109   AST 15 02/21/2021 0826   ALT 23 03/26/2021 1109   ALT 18 02/21/2021 0826   ALKPHOS 55 03/26/2021 1109   BILITOT 0.6 03/26/2021 1109   BILITOT <0.2 (L) 02/21/2021 0826   GFRNONAA >60 03/26/2021 1109   GFRNONAA >60 02/21/2021 0826   GFRAA >60 12/04/2019 9702   Imaging MD reviewed the images  obtained.  CT head/CTV 1. No evidence of acute intracranial abnormality. 2. No evidence of dural venous sinus thrombosis. 3. Partially empty and expanded sella and narrowing of bilateral transverse sinuses. These findings are frequently normal anatomic variants, but can be seen with idiopathic intracranial hypertension in the correct clinical setting.   CT head and neck pending.   Assessment: 46 yo female who presents with constant right sided HA x 3 weeks. Tinnitus AD a few days before HA started. Given her history of SAH, we are doing a CTA.  CTH and CTV are unremarkable for bleeding or dural venous sinus thrombosis. If none of her test are positive for bleeding, we will need to check an LP for blood and possible new SAH.   Impression: -right sided HA with tinnitus AD with decreased sensation on right face.  -MHA?  -Hx SAH.  -Doubt CNS infection given nuchal suppleness and no fever or leukocytosis.   Recommendations/Plan:  -Hold Xarelto for LP tomorrow.  -After CTA, we can maybe try MHA cocktail.  -May need ENT consult for tinnitus. It is associated with hearing loss  frequently which she denies.   Addendum CTA head and neck: No large vessel occlusion or proximal hemodynamically significant stenosis in the head or neck.  Pt seen by Jimmye Norman, NP/Neuro and later by MD. Note/plan to be edited by MD as needed.  Pager: [phone]  Neurology Attending Attestation   I examined the patient and discussed plan with NP. Above note has been edited by me to reflect my findings and recommendations.  This is a 46 year old woman with a very complicated past medical history including refractory ITP, anemia, chronic subarachnoid hemorrhage on prior admission, DVT/PE on Xarelto who presents with intractable headache for 3 weeks.  She developed pulsatile tinnitus a few days before the headache started.  CT head, CTV, CTA head and neck all unremarkable in the ED.  Due to concern for possible  subarachnoid hemorrhage she will need a lumbar puncture to rule this out.  She is on Xarelto and so this was unable to be performed in the ED (last dose was 1/6 at 2200).  This should be held overnight for possible LP tomorrow.  Xarelto needs to be held for at least 24 hours in a patient with normal renal function prior to lumbar puncture.  Due to her BMI of 48 she is that of very high risk of traumatic tap at bedside therefore because the LP is indicated to rule out subarachnoid hemorrhage the procedure will need to be performed with IR under fluoroscopic guidance.  It is imperative that cell count be ordered in 2 separate tubes 1 and 4 so that we can see if the red cells clear if the tap under fluoroscopy is traumatic.  Glucose, protein, and gram stain and culture should also be ordered.  No other labs need to be performed on the CSF.  Please order LP with IR in the morning. Patient has received tylenol, robaxin, compazine, and zofran for headache in ED.  Bing Neighbors, MD [facility] [phone]  If 7pm- 7am, please page neurology on call as listed in AMION.

## 2021-03-26 NOTE — H&P (Addendum)
[facility] Admission History and Physical Service Pager: [phone]  Patient name: [name] Medical record number: [id number] Date of birth: 05-28-1975 Age: 46 y.o. Gender: female  Primary Care Provider: Truitt Merle, MD Consultants: Neurology, ENT (outpatient f/u) Code Status:  Preferred Emergency Contact: Adriana Mccallum (daughter) [phone]  Chief Complaint: Headache and Tinnitus  Assessment and Plan: [name] is a 46 y.o. female presenting with right sided headache for the past three weeks and tinnitus. PMH is significant for recurrent refractory ITP, SAH hx, PE/DVT on Xarelto, May Thurner syndrome, and depression  Headache   Prior Subarachnoid Hemorrhage Patient has history of 3-week right-sided headache and tinnitus that preceded the headache by a couple of days.  Given history of subarachnoid hemorrhage in the past patient received CT venogram of head which showed no acute abnormalities and CT angio head and neck which showed no large vessel occlusions or stenosis.  Neurology was consulted by ED provider. Admission for lumbar puncture tomorrow (which could not be performed in ED due to recent anticoagulation) to rule out subarachnoid hemorrhage.  Patient received dose of Compazine in the ED with improvement of pain.  CMP showed no acute abnormalities and on CBC had a hemoglobin of 11.8 (around her baseline) and platelets of 208 (platelets of 427 in December). Vitals signs were within normal limits. On physical exam showed some sensation differences as she says it " feels swollen on her right side" for V1, V2, V3.  She had ringing in her right ear but no hearing loss that she has noted. Differential includes subarachnoid hemorrhage given history of previous one in her past although normal head CT, and per neurology will rule out with lumbar puncture. Occipital neuralgia possibility given 03/2019 hospitalization Dr. Mickeal Skinner believed her  headache at that time was consistent with this and has  V1, V2, V3 distribution. Other differentials include stroke given patient is subjectively says right side of face has sensation differences, although speech and strength intact. Vestibular migraine possibility as well given unilateral pain however no photophobia or visual disturbances.  -Admit to [facility], attending Dr. Andria Frames -Neurology consulted, appreciated assistance -Holding VTE prophylaxis for LP tomorrow  -Consider MRI/additional head imaging if patient has change in neurological status  -Tylenol 650 mg q6h prn -Zofran q8h prn -vitals per floor protocol -AM CBC, BMP -PT to evaluate and treat  Tinnitus Occurring on R side for a little over 3 weeks. Has not had prior issues like this. ENT consulted in ED and following up outpatient. Patient does not complain of any hearing loss on exam today. Differential includes Meneire's although no subjective hearing loss. Has had chronic  dizziness in chart review. Unlikely to be carotid artery stenosis or aneurysm given tinnitus is constant and not pulsatile. -ENT follow up outpatient -Advised activity and white noise  ITP, Recurrent/Refractory   Splenectomy Followed by Dr. Burr Medico in Hematology. Has been treated with steroids, IVIG, Nplate, Rituxan and chemo int the past. At last hematology appointment in December was doing well from IT standpoint. Platelet count at that point was 427K. Today platelet count is 208. Hgb stable. Currently on her period but denies any other bleeding today except for hemmorhoids. On exam no gum bleeding.  -AM CBC to monitor platelets -Consider reaching out to Hematology (Dr. 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 Also had bilateral PE during that time. Was  discharged at that time on warfarin but was switched to Xarelto in April 2022. Today she has some chronic pain and tenderness in her left politeal/posterior thigh, however no swelling, warmth or erythema noted. -L DVT u/s ordered given patient's history and tenderness in popliteal region -Holding Xarelto given LP tomorrow -SCDs  Obesity   hyperglycemia Recorded height inaccurate, unable to calculate BMI. Last recorded one on chart review 50.23. Last A1c of 7.2 in 2021. No diagnosis of diabetes.  -A1c -Lipid panel  Constipation Says her last BM was Thursday. -Miralax 17 g daily   Chronic Back Pain Home medication of robaxin. -Continue robaxin 500 mg q8h prn  FEN/GI: Regular Diet Prophylaxis: SCDs  Disposition: Med-Surg  History of Present Illness:  [name] is a 46 y.o. female presenting with right sided headache and tinnitus.  Endorses right sided head pain and ringing of ears on the right side. Denies difficulty hearing. Does endorses blurry vision. States headache has been ongoing for 3 weeks. The pain in her head is similar to her chronic headaches. Buzzing started a little over 3 weeks ago. Has tried Tylenol but it did not help.  States she feels very anxious like she needs to run outside. She feels a lot of despair and states she did not feel this way at home. States pain in her head has been constant. Denies vomiting, chest pain, or difficulty breathing. Does endorse L sided abdominal pain after she eats but says she has had her spleen removed. Denies any blood in stool, but she does endorse hemorrhoids. Denies burning with urination. She is on menstrual cycle. Denies leg swelling, but does endorse L leg pain where she had blood clots in 2021 when she walks for the past year after her procedure.   Denies tobacco use, alcohol use, recreational drug use   States the medication she uses is for the pain in her leg, blood thinner which she last took yesterday at  [place]: Per HPI with the following additions:   Review of Systems  Constitutional:  Negative for fever.  HENT:  Positive for tinnitus.   Eyes:  Negative for photophobia, pain, redness and visual disturbance.  Respiratory:  Negative for chest tightness and shortness of breath.   Cardiovascular:  Negative for chest pain and leg swelling.  Gastrointestinal:  Positive for abdominal pain and constipation. Negative for blood in stool, diarrhea, nausea and vomiting.  Genitourinary:  Positive for vaginal bleeding. Negative for dysuria and hematuria.  Musculoskeletal:  Negative for joint swelling.  Neurological:  Positive for headaches. Negative for weakness.    Patient Active Problem List   Diagnosis Date Noted   Lumbar radiculopathy, right 11/13/2019   Acute pulmonary embolism (HCC)    Depression    GERD (gastroesophageal reflux disease)    May-Thurner syndrome    DVT (deep venous thrombosis) ([place]) 10/07/2019   History of ITP    Leukocytosis    Chronic headaches 08/28/2019   Thrombocytopenia ([place]) 03/02/2019   Borderline diabetes 10/16/2018   Idiopathic thrombocytopenic purpura (ITP) ([place]) 10/14/2018   Morbid obesity ([place]) 10/14/2018   Hyperglycemia 10/14/2018   Retinal hemorrhage of both eyes 10/05/2015   Chronic ITP (idiopathic thrombocytopenia) (HCC)    Nontraumatic intracerebral hemorrhage (HCC)    Menorrhagia with regular cycle    Pressure ulcer 09/19/2015   Gingival bleeding    Chronic hepatitis B ([place]) 09/04/2015   Subarachnoidal hemorrhage (  [place]) 09/04/2015   Severe thrombocytopenia (HCC) 09/04/2015   Acute ITP ([place]) 08/28/2015    Past Medical History: 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    Past  Surgical History: Past Surgical History:  Procedure Laterality Date   BREAST SURGERY  biopsy   COLONOSCOPY WITH PROPOFOL N/A 09/09/2020   Procedure: COLONOSCOPY WITH PROPOFOL;  Surgeon: Doran Stabler, MD;  Location: [facility];  Service: Gastroenterology;  Laterality: N/A;   LOWER EXTREMITY VENOGRAPHY Left 10/08/2019   Procedure: LOWER EXTREMITY VENOGRAPHY;  Surgeon: Marty Heck, MD;  Location: [facility];  Service: Cardiovascular;  Laterality: Left;   PERIPHERAL VASCULAR INTERVENTION Left 10/08/2019   Procedure: PERIPHERAL VASCULAR INTERVENTION;  Surgeon: Marty Heck, MD;  Location: [facility];  Service: Cardiovascular;  Laterality: Left;  LT COMMON ILIAC VEIN   PERIPHERAL VASCULAR THROMBECTOMY N/A 10/08/2019   Procedure: PERIPHERAL VASCULAR THROMBECTOMY;  Surgeon: Marty Heck, MD;  Location: [facility];  Service: Cardiovascular;  Laterality: N/A;   SPLENECTOMY, TOTAL N/A 09/16/2015   Procedure: OPEN SPLENECTOMY;  Surgeon: Autumn Messing III, MD;  Location: [facility];  Service: General;  Laterality: N/A;    Social History: Social History   Tobacco Use   Smoking status: Never   Smokeless tobacco: Never  Vaping Use   Vaping Use: Never used  Substance Use Topics   Alcohol use: No    Alcohol/week: 0.0 standard drinks   Drug use: No   Additional social history:  Currently lives at home with husband and children Please also refer to relevant sections of EMR.  Family History: Family History  Problem Relation Age of Onset   Diabetes Mother    Breast cancer Mother    Diabetes Father    Uterine cancer Sister    Breast cancer Sister    Breast cancer Sister    Breast cancer Sister    Colon cancer Neg Hx    Pancreatic cancer Neg Hx    Esophageal cancer Neg Hx    Stomach cancer Neg Hx    Liver disease Neg Hx     Allergies and Medications: No Known Allergies No current facility-administered medications on file  prior to encounter.   Current Outpatient Medications on File Prior to Encounter  Medication Sig Dispense Refill   acetaminophen (TYLENOL) 325 MG tablet Take 650 mg by mouth every 6 (six) hours as needed for mild pain or headache.     methocarbamol (ROBAXIN) 500 MG tablet Take 1 tablet (500 mg total) by mouth every 8 (eight) hours as needed for muscle spasms. 30 tablet 0   rivaroxaban (XARELTO) 20 MG TABS tablet Take 1 tablet (20 mg total) by mouth daily with supper. 30 tablet 6   fluticasone (FLONASE) 50 MCG/ACT nasal spray Place 1 spray into both nostrils daily. (Patient not taking: Reported on 09/01/2020) 15.8 mL 1    Objective: BP 111/71    Pulse 65    Temp 98.8 F (37.1 C)    Resp 17    SpO2 99%  Exam: General: Non-toxic, tearful about being in hospital, laying in bed Eyes: EOMI, PERRLA ENTM: Moist mucous membranes, normocephalic atraumatic Neck: ROM intact Cardiovascular: RRR no m/r/g,cap refill Respiratory: CTAB no w/r/c Gastrointestinal: Nontender to palpation,  soft, central obesity, BS+ MSK: No difference is calf sizes between L & R leg, no calf tenderness, L poplitieal/lower posterior thigh tender to palpation. No erythema, warmth,swelling visualized.  Derm: No rashes visualized Neuro: CN II: PERRL CN III, IV,VI: EOMI CV V: Sensation less on right side of face, V1, V2, V3 CVII: Symmetric smile and brow raise CN VIII: Normal hearing, able to differentiate sides when eyes closed CN IX,X: Symmetric palate raise  CN XI: 5/5 shoulder shrug CN XII: Symmetric tongue protrusion  UE and LE strength 5/5 Normal sensation in UE and LE bilaterally  No ataxia with finger to nose Psych: Tearful about situation but pleasant  Labs and Imaging: CBC BMET  Recent Labs  Lab 03/26/21 1109 03/26/21 1123  WBC 9.7  --   HGB 11.8* 12.2  HCT 36.4 36.0  PLT 208  --    Recent Labs  Lab 03/26/21 1109 03/26/21 1123  NA 138 137  K 4.0 3.9  CL 106 105  CO2 23  --   BUN 9 10   CREATININE 0.52 0.40*  GLUCOSE 134* 137*  CALCIUM 9.1  --      EKG:  NSR no acute ST changes   CT HEAD WO CONTRAST  Result Date: 03/26/2021 CLINICAL DATA:  Headache, chronic, new features or increased frequency EXAM: CT HEAD WITHOUT CONTRAST CT VENOGRAM HEAD TECHNIQUE: Contiguous axial images were obtained from the base of the skull through the vertex without intravenous contrast. Venographic phase images of the brain were obtained following the administration of intravenous contrast. Multiplanar reformats and maximum intensity projections were generated. CONTRAST:  89mL OMNIPAQUE IOHEXOL 350 MG/ML SOLN COMPARISON:  10/13/2019. FINDINGS: CT HEAD Brain: No evidence of acute large vascular territory infarct, acute hemorrhage, mass lesion, mass effect, or visible extra-axial fluid collection. No hydrocephalus. Similar scattered parenchymal calcifications. Partially empty and expanded sella, similar. Vascular: No hyperdense vessel identified. Skull: No acute fracture. Sinuses/orbits: Mild paranasal sinus mucosal thickening. No acute orbital findings. Other: No mastoid effusions. CT VENOGRAM No evidence of dural venous sinus thrombosis. The superior sagittal sinus, transverse, sigmoid, and straight sinuses are patent. Narrowing of the distal transverse sinuses bilaterally, left more so than right. The visible deep veins are patent. The jugular bulbs and visualized upper internal jugular veins are patent. IMPRESSION: 1. No evidence of acute intracranial abnormality. 2. No evidence of dural venous sinus thrombosis. 3. Partially empty and expanded sella and narrowing of bilateral transverse sinuses. These findings are frequently normal anatomic variants, but can be seen with idiopathic intracranial hypertension in the correct clinical setting. Electronically Signed   By: Margaretha Sheffield M.D.   On: 03/26/2021 12:26   CT VENOGRAM HEAD  Result Date: 03/26/2021 CLINICAL DATA:  Headache, chronic, new features or  increased frequency EXAM: CT HEAD WITHOUT CONTRAST CT VENOGRAM HEAD TECHNIQUE: Contiguous axial images were obtained from the base of the skull through the vertex without intravenous contrast. Venographic phase images of the brain were obtained following the administration of intravenous contrast. Multiplanar reformats and maximum intensity projections were generated. CONTRAST:  80mL OMNIPAQUE IOHEXOL 350 MG/ML SOLN COMPARISON:  10/13/2019. FINDINGS: CT HEAD Brain: No evidence of acute large vascular territory infarct, acute hemorrhage, mass lesion, mass effect, or visible extra-axial fluid collection. No hydrocephalus. Similar scattered parenchymal calcifications. Partially empty and expanded sella, similar. Vascular: No hyperdense vessel identified. Skull: No acute fracture. Sinuses/orbits: Mild paranasal sinus mucosal thickening. No acute orbital findings. Other: No mastoid effusions. CT VENOGRAM No evidence of dural venous sinus thrombosis.  The superior sagittal sinus, transverse, sigmoid, and straight sinuses are patent. Narrowing of the distal transverse sinuses bilaterally, left more so than right. The visible deep veins are patent. The jugular bulbs and visualized upper internal jugular veins are patent. IMPRESSION: 1. No evidence of acute intracranial abnormality. 2. No evidence of dural venous sinus thrombosis. 3. Partially empty and expanded sella and narrowing of bilateral transverse sinuses. These findings are frequently normal anatomic variants, but can be seen with idiopathic intracranial hypertension in the correct clinical setting. Electronically Signed   By: Margaretha Sheffield M.D.   On: 03/26/2021 12:26   CT ANGIO HEAD NECK W WO CM (CODE STROKE)  Result Date: 03/26/2021 CLINICAL DATA:  Neuro deficit, acute, stroke suspected EXAM: CT ANGIOGRAPHY HEAD AND NECK TECHNIQUE: Multidetector CT imaging of the head and neck was performed using the standard protocol during bolus administration of  intravenous contrast. Multiplanar CT image reconstructions and MIPs were obtained to evaluate the vascular anatomy. Carotid stenosis measurements (when applicable) are obtained utilizing NASCET criteria, using the distal internal carotid diameter as the denominator. CONTRAST:  55mL OMNIPAQUE IOHEXOL 350 MG/ML SOLN COMPARISON:  Same day CT head/venogram FINDINGS: CTA NECK FINDINGS Aortic arch: Great vessel origins are patent. Right carotid system: No evidence of dissection, stenosis (50% or greater) or occlusion. Retropharyngeal course. Left carotid system: No evidence of dissection, stenosis (50% or greater) or occlusion. Retropharyngeal course. Vertebral arteries: Codominant. No evidence of dissection, stenosis (50% or greater) or occlusion. Skeleton: No evidence of acute abnormality on limited assessment. Other neck: No evidence of acute abnormality on limited assessment. Upper chest: Visualized lung apices are clear. Review of the MIP images confirms the above findings CTA HEAD FINDINGS Anterior circulation: Visualized intracranial ICAs, MCAs, and ACAs are patent without proximal hemodynamically significant stenosis. No aneurysm identified. Posterior circulation: Bilateral intradural vertebral arteries, basilar artery, and posterior cerebral arteries are patent without proximal hemodynamically significant stenosis. No aneurysm identified. Venous sinuses: As permitted by contrast timing, patent. Further evaluated on same day CT venogram. Review of the MIP images confirms the above findings IMPRESSION: No large vessel occlusion or proximal hemodynamically significant stenosis in the head or neck. Electronically Signed   By: Margaretha Sheffield M.D.   On: 03/26/2021 14:03      Gerrit Heck, MD 03/26/2021, 2:47 PM PGY-1, [place] Intern pager: [phone], text pages welcome    FPTS Upper-Level Resident Addendum   I have independently interviewed and examined the patient. I have  discussed the above with the original author and agree with their documentation. My edits for correction/addition/clarification are included. Please see also any attending notes.   Shary Key, D.O. PGY-2, [facility] 03/26/2021 5:32 PM  [facility] Service pager: [phone] (text pages welcome through [place])

## 2021-03-26 NOTE — ED Notes (Signed)
Walked patient to the bathroom patient did well 

## 2021-03-26 NOTE — ED Notes (Signed)
Hooked pt back up to monitoring equipment

## 2021-03-26 NOTE — Plan of Care (Signed)

## 2021-03-26 NOTE — ED Provider Notes (Addendum)
[facility] EMERGENCY DEPARTMENT Provider Note   CSN: [id number] Arrival date & time: 03/26/21  1023     History  No chief complaint on file.   [name] is a 46 y.o. female.  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 Hx of ITP, chronic SAH, PE/DVT on Xarelto.  Pt reports right sided headache x 3 weeks, and she has had similar headaches, but it used to be the entire head and not just one side.  The headache is described as constant, throbbing and stabbing. She has no specific evoking, aggravating or relieving factor. Tylenol doesn't help.  Pt also has R sided ear pain and ringing. Ringing is worse than pain, and the ringing started prior to the headache. She is able to hear well, no drainage.   Interpreter was utilized for this visit.     Home Medications Prior to Admission medications   Medication Sig Start Date End Date Taking? Authorizing Provider  fluticasone (FLONASE) 50 MCG/ACT nasal spray Place 1 spray into both nostrils daily. Patient not taking: Reported on 09/01/2020 08/23/20   Malachy Mood, MD  methocarbamol (ROBAXIN) 500 MG tablet Take 1 tablet (500 mg total) by mouth every 8 (eight) hours as needed for muscle spasms. 02/21/21   Malachy Mood, MD  rivaroxaban (XARELTO) 20 MG TABS tablet Take 1 tablet (20 mg total) by mouth daily with supper. 02/21/21   Malachy Mood, MD      Allergies    Patient has no known allergies.    Review of Systems   Review of Systems  Constitutional:  Positive for activity change.  HENT:  Positive for tinnitus.   Eyes:  Negative for visual disturbance.  Neurological:  Positive for headaches.   Physical Exam Updated Vital Signs BP 111/71    Pulse 65    Temp 98.8 F (37.1 C)    Resp 17    SpO2 99%  Physical Exam Vitals and nursing note reviewed.  Constitutional:      Appearance: She is well-developed.  HENT:     Head: Atraumatic.     Right Ear: Tympanic membrane, ear canal and external ear  normal.     Left Ear: Tympanic membrane, ear canal and external ear normal.     Ears:     Comments: Reduced hearing on the right side Eyes:     Extraocular Movements: Extraocular movements intact.     Pupils: Pupils are equal, round, and reactive to light.  Cardiovascular:     Rate and Rhythm: Normal rate.  Pulmonary:     Effort: Pulmonary effort is normal.  Musculoskeletal:     Cervical back: Normal range of motion and neck supple.  Skin:    General: Skin is warm and dry.  Neurological:     Mental Status: She is alert and oriented to person, place, and time.     Sensory: Sensory deficit present.     Motor: No weakness.     Coordination: Coordination normal.     Comments: Subjective numbness to the right side of the face    ED Results / Procedures / Treatments   Labs (all labs ordered are listed, but only abnormal results are displayed) Labs Reviewed  CBC - Abnormal; Notable for the following components:      Result Value   Hemoglobin 11.8 (*)    All other components within normal limits  COMPREHENSIVE METABOLIC PANEL - Abnormal; Notable for the following components:  Glucose, Bld 134 (*)    All other components within normal limits  PROTIME-INR - Abnormal; Notable for the following components:   Prothrombin Time 20.0 (*)    INR 1.7 (*)    All other components within normal limits  APTT - Abnormal; Notable for the following components:   aPTT 48 (*)    All other components within normal limits  I-STAT CHEM 8, ED - Abnormal; Notable for the following components:   Creatinine, Ser 0.40 (*)    Glucose, Bld 137 (*)    Calcium, Ion 1.10 (*)    All other components within normal limits  CBG MONITORING, ED - Abnormal; Notable for the following components:   Glucose-Capillary 123 (*)    All other components within normal limits  RESP PANEL BY RT-PCR (FLU A&B, COVID) ARPGX2  DIFFERENTIAL  I-STAT BETA HCG BLOOD, ED ([facility], [facility], [facility] ONLY)    EKG EKG  Interpretation  Date/Time:  Saturday March 26 2021 11:13:09 EST Ventricular Rate:  70 PR Interval:  184 QRS Duration: 80 QT Interval:  396 QTC Calculation: 427 R Axis:   43 Text Interpretation: Normal sinus rhythm Normal ECG When compared with ECG of 13-Oct-2019 08:47, PREVIOUS ECG IS PRESENT No acute changes No significant change since last tracing Confirmed by Varney Biles [phone]) on 03/26/2021 11:38:13 AM  Radiology CT HEAD WO CONTRAST  Result Date: 03/26/2021 CLINICAL DATA:  Headache, chronic, new features or increased frequency EXAM: CT HEAD WITHOUT CONTRAST CT VENOGRAM HEAD TECHNIQUE: Contiguous axial images were obtained from the base of the skull through the vertex without intravenous contrast. Venographic phase images of the brain were obtained following the administration of intravenous contrast. Multiplanar reformats and maximum intensity projections were generated. CONTRAST:  36mL OMNIPAQUE IOHEXOL 350 MG/ML SOLN COMPARISON:  10/13/2019. FINDINGS: CT HEAD Brain: No evidence of acute large vascular territory infarct, acute hemorrhage, mass lesion, mass effect, or visible extra-axial fluid collection. No hydrocephalus. Similar scattered parenchymal calcifications. Partially empty and expanded sella, similar. Vascular: No hyperdense vessel identified. Skull: No acute fracture. Sinuses/orbits: Mild paranasal sinus mucosal thickening. No acute orbital findings. Other: No mastoid effusions. CT VENOGRAM No evidence of dural venous sinus thrombosis. The superior sagittal sinus, transverse, sigmoid, and straight sinuses are patent. Narrowing of the distal transverse sinuses bilaterally, left more so than right. The visible deep veins are patent. The jugular bulbs and visualized upper internal jugular veins are patent. IMPRESSION: 1. No evidence of acute intracranial abnormality. 2. No evidence of dural venous sinus thrombosis. 3. Partially empty and expanded sella and narrowing of bilateral  transverse sinuses. These findings are frequently normal anatomic variants, but can be seen with idiopathic intracranial hypertension in the correct clinical setting. Electronically Signed   By: Margaretha Sheffield M.D.   On: 03/26/2021 12:26   CT VENOGRAM HEAD  Result Date: 03/26/2021 CLINICAL DATA:  Headache, chronic, new features or increased frequency EXAM: CT HEAD WITHOUT CONTRAST CT VENOGRAM HEAD TECHNIQUE: Contiguous axial images were obtained from the base of the skull through the vertex without intravenous contrast. Venographic phase images of the brain were obtained following the administration of intravenous contrast. Multiplanar reformats and maximum intensity projections were generated. CONTRAST:  83mL OMNIPAQUE IOHEXOL 350 MG/ML SOLN COMPARISON:  10/13/2019. FINDINGS: CT HEAD Brain: No evidence of acute large vascular territory infarct, acute hemorrhage, mass lesion, mass effect, or visible extra-axial fluid collection. No hydrocephalus. Similar scattered parenchymal calcifications. Partially empty and expanded sella, similar. Vascular: No hyperdense vessel identified. Skull: No acute fracture. Sinuses/orbits:  Mild paranasal sinus mucosal thickening. No acute orbital findings. Other: No mastoid effusions. CT VENOGRAM No evidence of dural venous sinus thrombosis. The superior sagittal sinus, transverse, sigmoid, and straight sinuses are patent. Narrowing of the distal transverse sinuses bilaterally, left more so than right. The visible deep veins are patent. The jugular bulbs and visualized upper internal jugular veins are patent. IMPRESSION: 1. No evidence of acute intracranial abnormality. 2. No evidence of dural venous sinus thrombosis. 3. Partially empty and expanded sella and narrowing of bilateral transverse sinuses. These findings are frequently normal anatomic variants, but can be seen with idiopathic intracranial hypertension in the correct clinical setting. Electronically Signed   By:  Margaretha Sheffield M.D.   On: 03/26/2021 12:26   CT ANGIO HEAD NECK W WO CM (CODE STROKE)  Result Date: 03/26/2021 CLINICAL DATA:  Neuro deficit, acute, stroke suspected EXAM: CT ANGIOGRAPHY HEAD AND NECK TECHNIQUE: Multidetector CT imaging of the head and neck was performed using the standard protocol during bolus administration of intravenous contrast. Multiplanar CT image reconstructions and MIPs were obtained to evaluate the vascular anatomy. Carotid stenosis measurements (when applicable) are obtained utilizing NASCET criteria, using the distal internal carotid diameter as the denominator. CONTRAST:  33mL OMNIPAQUE IOHEXOL 350 MG/ML SOLN COMPARISON:  Same day CT head/venogram FINDINGS: CTA NECK FINDINGS Aortic arch: Great vessel origins are patent. Right carotid system: No evidence of dissection, stenosis (50% or greater) or occlusion. Retropharyngeal course. Left carotid system: No evidence of dissection, stenosis (50% or greater) or occlusion. Retropharyngeal course. Vertebral arteries: Codominant. No evidence of dissection, stenosis (50% or greater) or occlusion. Skeleton: No evidence of acute abnormality on limited assessment. Other neck: No evidence of acute abnormality on limited assessment. Upper chest: Visualized lung apices are clear. Review of the MIP images confirms the above findings CTA HEAD FINDINGS Anterior circulation: Visualized intracranial ICAs, MCAs, and ACAs are patent without proximal hemodynamically significant stenosis. No aneurysm identified. Posterior circulation: Bilateral intradural vertebral arteries, basilar artery, and posterior cerebral arteries are patent without proximal hemodynamically significant stenosis. No aneurysm identified. Venous sinuses: As permitted by contrast timing, patent. Further evaluated on same day CT venogram. Review of the MIP images confirms the above findings IMPRESSION: No large vessel occlusion or proximal hemodynamically significant stenosis in the  head or neck. Electronically Signed   By: Margaretha Sheffield M.D.   On: 03/26/2021 14:03    Procedures .Critical Care Performed by: Varney Biles, MD Authorized by: Varney Biles, MD   Critical care provider statement:    Critical care time (minutes):  52   Critical care was necessary to treat or prevent imminent or life-threatening deterioration of the following conditions:  CNS failure or compromise   Critical care was time spent personally by me on the following activities:  Development of treatment plan with patient or surrogate, discussions with consultants, evaluation of patient's response to treatment, examination of patient, ordering and review of laboratory studies, ordering and review of radiographic studies, ordering and performing treatments and interventions, pulse oximetry, re-evaluation of patient's condition and review of old charts    Medications Ordered in ED Medications  acetaminophen (TYLENOL) tablet 1,000 mg (has no administration in time range)  prochlorperazine (COMPAZINE) injection 10 mg (has no administration in time range)  iohexol (OMNIPAQUE) 350 MG/ML injection 75 mL (75 mLs Intravenous Contrast Given 03/26/21 1212)  iohexol (OMNIPAQUE) 350 MG/ML injection 75 mL (75 mLs Intravenous Contrast Given 03/26/21 1334)    ED Course/ Medical Decision Making/ A&P Clinical Course as of  03/26/21 1434  Sat Mar 26, 2021  1310 I discussed case with Dr. Quinn Axe, neurology.  She is recommending we get CT angiogram head and neck.  They will come and see the patient and provide further recommendations. 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    The complaints involves an extensive differential diagnosis and treatment options and also carries with it a high risk of complications and morbidity.    The differential diagnosis includes: Subarachnoid hemorrhage, venous-dural thrombosis, cerebral vascular aneurysm, cerebral vascular dissection, tumor, stroke, Mnire's disease, ITP.  With the history, suspicion for infectious, trauma or medication related side effect etiology is low.  The initial plan is to get basic labs along with CT scan of the head, CT venogram of the head and reassess   Comorbidities that complicate the patient evaluation: Patients presentation is complicated by their history of subarachnoid hemorrhage that was noticed on MRI, hypercoagulability disorder.  Social Determinants of Health: Patients  language barrier   increases the complexity of managing their presentation -however translation service was utilized for this visit.  Additional history obtained: Records reviewed previous admission documents, Primary Care Documents, and previous imaging including MRI from 2017  Reassessment and review: Lab Tests:  I Ordered, and personally interpreted labs.  The pertinent results include: CBC with normal white count and platelet count.  Imaging Studies ordered: I independently visualized and interpreted the following imaging CT scan brain and CT venogram   which showed: No evidence of acute brain bleed, I agree with the radiologist interpretation. Radiologist did not appreciate any thrombosis.  Critical Interventions:  Consulting neurology for further recommendations.  Cardiac Monitoring: The patient was maintained on a cardiac monitor.  I personally viewed and interpreted the cardiac monitor which showed an underlying rhythm of:  sinus rhythm  Medicines ordered and prescription drug management: I ordered medications, including IV Reglan for pain control Reevaluation of the patient after these medicines  showed that the patient    improved  Consultations Obtained: I requested consultation with the admitting physician   and consultant (neurology) , and discussed  findings as well as pertinent plan - they recommend: Admission to the hospital for LP.  Patient is on Xarelto, which will prevent [place] from getting the LP completed right now.  The also recommend be at ENT, therefore ENT also has been consulted.  Dr. Benjamine Mola has reviewed the imaging.  He recommends that patient be provided with follow-up to his clinic.  Tinnitus, hearing loss can be assessed by them in the clinic better than in the inpatient setting.  Complexity of problems addressed: Patients presentation is most consistent with  acute presentation with potential threat to life or bodily function During patient's assessment  Disposition: After consideration of the diagnostic results and the patients response to treatment,  I feel that the patent would benefit from admission to medicine service .     Final Clinical Impression(s) / ED Diagnoses Final diagnoses:  Tinnitus of right ear  Bad headache    Rx / DC Orders ED Discharge Orders     None  Varney Biles, MD 03/26/21 [place], MD 03/26/21 1434

## 2021-03-26 NOTE — Progress Notes (Signed)
Pt arrived to room 6N17 via wheelchair from the ED. Received report from Rosamond, Charity fundraiser. Will continue to monitor.

## 2021-03-27 ENCOUNTER — Observation Stay (HOSPITAL_COMMUNITY): Payer: Self-pay

## 2021-03-27 ENCOUNTER — Observation Stay (HOSPITAL_BASED_OUTPATIENT_CLINIC_OR_DEPARTMENT_OTHER): Payer: Self-pay

## 2021-03-27 DIAGNOSIS — R519 Headache, unspecified: Secondary | ICD-10-CM

## 2021-03-27 DIAGNOSIS — M25562 Pain in left knee: Secondary | ICD-10-CM

## 2021-03-27 DIAGNOSIS — H9311 Tinnitus, right ear: Secondary | ICD-10-CM

## 2021-03-27 LAB — CBC
HCT: 34 % — ABNORMAL LOW (ref 36.0–46.0)
Hemoglobin: 11.4 g/dL — ABNORMAL LOW (ref 12.0–15.0)
MCH: 29.2 pg (ref 26.0–34.0)
MCHC: 33.5 g/dL (ref 30.0–36.0)
MCV: 87.2 fL (ref 80.0–100.0)
Platelets: 182 10*3/uL (ref 150–400)
RBC: 3.9 MIL/uL (ref 3.87–5.11)
RDW: 14.9 % (ref 11.5–15.5)
WBC: 8.5 10*3/uL (ref 4.0–10.5)
nRBC: 0 % (ref 0.0–0.2)

## 2021-03-27 LAB — CSF CELL COUNT WITH DIFFERENTIAL
RBC Count, CSF: 109 /mm3 — ABNORMAL HIGH
Tube #: 3
WBC, CSF: 0 /mm3 (ref 0–5)

## 2021-03-27 LAB — BASIC METABOLIC PANEL
Anion gap: 7 (ref 5–15)
BUN: 10 mg/dL (ref 6–20)
CO2: 25 mmol/L (ref 22–32)
Calcium: 9.1 mg/dL (ref 8.9–10.3)
Chloride: 104 mmol/L (ref 98–111)
Creatinine, Ser: 0.54 mg/dL (ref 0.44–1.00)
GFR, Estimated: >60 mL/min (ref >60)
Glucose, Bld: 117 mg/dL — ABNORMAL HIGH (ref 70–99)
Potassium: 3.6 mmol/L (ref 3.5–5.1)
Sodium: 136 mmol/L (ref 135–145)

## 2021-03-27 LAB — GLUCOSE, CSF: Glucose, CSF: 85 mg/dL — ABNORMAL HIGH (ref 40–70)

## 2021-03-27 LAB — SEDIMENTATION RATE: Sed Rate: 42 mm/hr — ABNORMAL HIGH (ref 0–22)

## 2021-03-27 LAB — LIPID PANEL
Cholesterol: 228 mg/dL — ABNORMAL HIGH (ref 0–200)
HDL: 33 mg/dL — ABNORMAL LOW (ref >40)
LDL Cholesterol: 121 mg/dL — ABNORMAL HIGH (ref 0–99)
Total CHOL/HDL Ratio: 6.9 RATIO
Triglycerides: 368 mg/dL — ABNORMAL HIGH (ref <150)
VLDL: 74 mg/dL — ABNORMAL HIGH (ref 0–40)

## 2021-03-27 LAB — PROTEIN, CSF: Total  Protein, CSF: 23 mg/dL (ref 15–45)

## 2021-03-27 LAB — HEMOGLOBIN A1C
Hgb A1c MFr Bld: 6.2 % — ABNORMAL HIGH (ref 4.8–5.6)
Mean Plasma Glucose: 131.24 mg/dL

## 2021-03-27 MED ORDER — OXYCODONE HCL 5 MG PO TABS
5.0000 mg | ORAL_TABLET | Freq: Once | ORAL | Status: AC
  Administered 2021-03-27: 5 mg via ORAL
  Filled 2021-03-27: qty 1

## 2021-03-27 NOTE — Progress Notes (Signed)
PT Cancellation Note  Patient Details Name: [name] MRN: [id number] DOB: 10-04-1975   Cancelled Treatment:    Reason Eval/Treat Not Completed: Patient not medically ready  Per RN, pt is suspected of having a DVT and awaiting dopplers. Will monitor and see as appropriate.   Jerolyn Center, PT Acute Rehabilitation Services  Pager [phone] Office [phone]    Zena Amos 03/27/2021, 10:10 AM

## 2021-03-27 NOTE — Progress Notes (Signed)
Lower extremity venous LT study completed.   Please see CV Proc for preliminary results.   Ayman Brull, RDMS, RVT  

## 2021-03-27 NOTE — Progress Notes (Signed)
PT Cancellation Note  Patient Details Name: [name] MRN: [id number] DOB: 1976/01/23   Cancelled Treatment:    Reason Eval/Treat Not Completed: Patient at procedure or test/unavailable  Noted dopplers negative for DVT. Patient currently off the floor for lumbar puncture. Will plan to evaluate 1/9 as pt will presumably be on bedrest after puncture.    Jerolyn Center, PT Acute Rehabilitation Services  Pager [phone] Office [phone]   Zena Amos 03/27/2021, 2:04 PM

## 2021-03-27 NOTE — Progress Notes (Addendum)
Neurology progress note.   S: Patient just returned from LP under fluoro. Her back hurts a little. Her HA is down to 3/10 and her medications (Tylenol and Robaxin) are helping. The decreased sensation and feeling of swelling to the right face that she had yesterday, is resolved. She denies dysphagia, aphasia, dysarthria, weakness or a particular limb, or numbness/tingling.   O: Well appearing female lying on her side in bed. NAD. Daughter serves as Engineer, technical sales. Face is symmetric. No blurry or double vision today. Speech is fluent. No aphasia. Strength to extremities is 5/5. Moves her extremities spontaneously.   A/P:  HA x 3 weeks, improving.  History of SAH. LP completed, will follow results for blood count and go from there.  Tinnitus AD. Plan for out patient ENT consult.  Xarelto (for DVT hx) held for LP, may restart in am.   Addendum: Results of cell count on CSF unremarkable. RBC 109 which rules out new SAH.   This plan was discussed with and approved by Dr. Selina Cooley.   Jimmye Norman, MSN, APN-BC Neurology Nurse Practitioner Pager [phone]  Neurology Attending Attestation   I discussed plan with NP and above note reflect my findings and plan I helped formulate. RBC 109 in tube 3 effectively rules out SAH. No further inpatient neurologic workup indicated. She may f/u with established outpatient neurologist (if she does not have one, please place amb referral to neurology). ENT consult may be performed outpatient. Restart xarelto tomorrow. Headache is improving. Neurology to sign off, but please re-engage if additional neurologic concerns arise.   Bing Neighbors, MD [facility] [phone]   If 7pm- 7am, please page neurology on call as listed in AMION.

## 2021-03-27 NOTE — Progress Notes (Signed)
Family Medicine Teaching Service Daily Progress Note Intern Pager: [phone]  Patient name: [name] Medical record number: [id number] Date of birth: 12/27/1975 Age: 46 y.o. Gender: female  Primary Care Provider: Malachy Mood, MD Consultants: Neurology, ENT (outpt) Code Status: FULL  Pt Overview and Major Events to Date:  1/7- Admitted  Assessment and Plan: [name] is a 46 y.o. female presenting with right sided headache for the past three weeks and tinnitus. PMH is significant for recurrent refractory ITP, SAH hx, PE/DVT on Xarelto, May Thurner syndrome, and depression   Headache   Prior Subarachnoid Hemorrhage Patient has history of 3-week right-sided headache and tinnitus that preceded the headache by a couple of days.  Given history of subarachnoid hemorrhage in the past will scheduled LP to rule out hemorrhage. CT imaging here without acute hemorrhage. Patient continues to endorse right sided headache.  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 Has been treated with steroids, IVIG, Nplate, Rituxan and chemo int the past. Today platelet count is 182. Hgb stable. Currently on her period but denies any other bleeding today except for hemmorhoids.  -AM CBC to monitor platelets -Consider reaching out to Hematology (Dr. 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 Also had bilateral PE during that time. Was discharged at that time on warfarin but was  switched to Xarelto in April 2022. No LE swelling or tenderness on my exam.  -L DVT u/s ordered given patient's history (at time of admission) and tenderness in popliteal region -Holding Xarelto given LP tomorrow -SCDs   Obesity   Prediabetes   Elevated cholesterol,  hypertriglyceridemia  BMI 48. A1c 6.2. LDL 121, tot chol 228, triglycerides 368, HDL 33. ASCVD risk 1.8%.    Chronic Back Pain Home medication of robaxin. -Continue robaxin 500 mg q8h prn  Tinnitus -ENT follow up outpatient -Advised activity and white noise   FEN/GI: Regular Diet Prophylaxis: SCDs Dispo:Home pending clinical improvement .   Subjective:  Pain medication helping with headaches but continues to return when medication wears off.   Objective: Temp:  [97.8 F (36.6 C)-98.8 F (37.1 C)] 98 F (36.7 C) (01/08 0738) Pulse Rate:  [65-90] 72 (01/08 0738) Resp:  [12-22] 18 (01/08 0738) BP: (99-126)/(50-109) 112/50 (01/08 0738) SpO2:  [97 %-100 %] 99 % (01/08 0738) Weight:  [160 kg] 112 kg (01/07 1829) Physical Exam: General: Appears well, no acute distress. Age appropriate. HEENT: Tender over right frontal and maxillary sinuses Cardiac: RRR, normal heart sounds, no murmurs Respiratory: CTAB, normal effort Abdomen: soft, nontender, nondistended Extremities: No edema or cyanosis. Skin: Warm and dry, no rashes noted Neuro: alert and oriented, no focal deficits Psych: normal affect   Laboratory: Recent Labs  Lab 03/26/21 1109 03/26/21 1123 03/27/21 0316  WBC 9.7  --  8.5  HGB 11.8* 12.2 11.4*  HCT 36.4 36.0 34.0*  PLT 208  --  182   Recent Labs  Lab 03/26/21 1109 03/26/21 1123 03/27/21 0316  NA 138 137 136  K 4.0 3.9 3.6  CL 106 105 104  CO2 23  --  25  BUN 9 10 10   CREATININE 0.52 0.40* 0.54  CALCIUM 9.1  --  9.1  PROT 6.8  --   --   BILITOT 0.6  --   --   ALKPHOS 55  --   --   ALT 23  --   --   AST 22  --   --   GLUCOSE 134* 137* 117*    Imaging/Diagnostic Tests: DVT and LP  pending.  , DO 03/27/2021, 8:39 AM PGY-3, [facility] FPTS Intern pager: [phone], text pages welcome

## 2021-03-27 NOTE — Progress Notes (Addendum)
FPTS Brief Progress Note  S:Still complaining of right-sided headache.  Received Tylenol and 6:19 PM and Robaxin around 1 PM.  O: BP (!) 100/58 (BP Location: Left Arm)    Pulse 65    Temp 98.5 F (36.9 C) (Oral)    Resp 16    Ht 5' (1.524 m)    Wt 112 kg    SpO2 99%    BMI 48.22 kg/m   Gen: NAD, smiling, lying in bed comfortably, responsive to questions Lungs: no iWOB Abd: Nontender, soft Extremities: No LE edema erythema or swelling, nontender to palpation, SCDS not in place Neuro: No acute deficits  A/P: Right Sided Headache Still complains of headache on right side. -Tylenol 650 mg q6h prn -Robaxin 500 mg q8h  -Will reorder 1 time dose of oxycodone 5 mg (last oxycodone 5 mg at 3 pm) Discussed with patient will try to limit this medication to avoid additional side effects. Can consider alternative agents if persistent through the night - Orders reviewed. Labs for AM ordered, which was adjusted as needed.   Hx of PE/DVT DVT u/s negative today.  Xarelto being held, plan to restart in AM per neuro note.  -Called nurse discussed putting SCDs on for patient given hx -Xarelto to restart in AM  Levin Erp, MD 03/27/2021, 9:19 PM PGY-1, [facility] Family Medicine Night Resident  Please page [phone] with questions.

## 2021-03-27 NOTE — Progress Notes (Signed)
FPTS Brief Progress Note  S: Patient seen at bedside for evening rounds. Reports ongoing R sided headache but otherwise no complaints. Spoke with primary RN who does not have any concerns at this time.   O: BP (!) 104/52 (BP Location: Left Arm)    Pulse 80    Temp 98.6 F (37 C) (Oral)    Resp 17    Ht 5' (1.524 m)    Wt 112 kg    SpO2 97%    BMI 48.22 kg/m   Gen: resting comfortably in bed, NAD Resp: normal work of breathing  A/P: Headache, Hx of Subarachnoid Hemorrhage No change from prior. CT head, CTV and CTA head/neck all unremarkable. -Planning for LP in the morning -Neuro following, appreciate recommendations  - Orders reviewed. Labs for AM ordered, which was adjusted as needed.   Remainder per H&P.  Maury Dus, MD 03/27/2021, 12:29 AM PGY-2, [facility] Night Resident  Please page [phone] with questions.

## 2021-03-28 LAB — CBC
HCT: 34.9 % — ABNORMAL LOW (ref 36.0–46.0)
Hemoglobin: 11.7 g/dL — ABNORMAL LOW (ref 12.0–15.0)
MCH: 29.2 pg (ref 26.0–34.0)
MCHC: 33.5 g/dL (ref 30.0–36.0)
MCV: 87 fL (ref 80.0–100.0)
Platelets: 166 10*3/uL (ref 150–400)
RBC: 4.01 MIL/uL (ref 3.87–5.11)
RDW: 14.8 % (ref 11.5–15.5)
WBC: 7.6 10*3/uL (ref 4.0–10.5)
nRBC: 0.3 % — ABNORMAL HIGH (ref 0.0–0.2)

## 2021-03-28 LAB — BASIC METABOLIC PANEL
Anion gap: 9 (ref 5–15)
BUN: 12 mg/dL (ref 6–20)
CO2: 26 mmol/L (ref 22–32)
Calcium: 9.2 mg/dL (ref 8.9–10.3)
Chloride: 103 mmol/L (ref 98–111)
Creatinine, Ser: 0.55 mg/dL (ref 0.44–1.00)
GFR, Estimated: >60 mL/min (ref >60)
Glucose, Bld: 114 mg/dL — ABNORMAL HIGH (ref 70–99)
Potassium: 3.9 mmol/L (ref 3.5–5.1)
Sodium: 138 mmol/L (ref 135–145)

## 2021-03-28 NOTE — Discharge Instructions (Addendum)
Dear [name],   Thank you for letting us participate in your care! In this section, you will find a brief hospital admission summary of why you were admitted to the hospital, what happened during your admission, your diagnosis/diagnoses, and recommended follow up.  You were admitted because you were experiencing right side headache.  Your testing revealed no brain bleed.  You were treated with Robaxin and Tylenol.  You were also seen by Neurologist. They recommended you follow up with neurologist outpatient.  Your headache improved and you were discharged from the hospital for meeting this goal.    POST-HOSPITAL & CARE INSTRUCTIONS Follow up with neurologist outpatient for headache management Please let PCP/Specialists know of any changes in medications that were made.  Please see medications section of this packet for any medication changes.   DOCTOR'S APPOINTMENTS & FOLLOW UP Future Appointments  Date Time Provider Department Center  02/20/2022  9:00 AM [facility] None  02/20/2022  9:40 AM Malachy Mood, MD [facility] None     Thank you for choosing [facility]! Take care and be well!  Family Medicine Teaching Service Inpatient Team [place]  [facility]  [address] [place], [place] [address] [phone]

## 2021-03-28 NOTE — Discharge Summary (Signed)
[facility] Discharge Summary  Patient name: [name] Medical record number: [id number] Date of birth: [birth year] Age: 46 y.o. Gender: female Date of Admission: 03/26/2021  Date of Discharge: 03/28/2021 Admitting Physician: Gerrit Heck, MD  Primary Care Provider: Truitt Merle, MD Consultants: ENT, neurology  Indication for Hospitalization: Headache and tinnitus  Discharge Diagnoses/Problem List:  Headache  Disposition: Home  Discharge Condition: Stable  Discharge Exam:  General:Awake, well appearing, NAD HEENT: Atraumatic, MMM, No sclera icterus CV: RRR, no murmurs, normal S1/S2 Pulm: CTAB, good WOB on RA, no crackles or wheezing Abd: Soft, no distension, no tenderness Skin: dry, warm Ext: No BLE edema, +2 Pedal and radial pulse.   Brief Hospital Course:  [name] is a 46 y.o. female presenting with right sided headache for the past three weeks and tinnitus. PMH is significant for recurrent refractory ITP, SAH hx, PE/DVT on Xarelto, May Thurner syndrome, and depression   Headache   Prior Subarachnoid Hemorrhage Patient has history of 3-week right-sided headache and tinnitus that preceded the headache by a couple of days.  Given history of subarachnoid hemorrhage in the past patient received CT venogram of head and CT angio head and neck which showed no acute abnormalities. Neurology was consulted and advised admission for lumbar puncture to rule out SAH. Marland Kitchen  Results of cell count on CSF unremarkable with RBC of 109. LP was normal. Head CT and LP findings ruled out [facility]. Decreased sensation and feeling of swelling to the right face resolved by time of discharge. Patient's pain was improved at discharge.   Tinnitus Constant buzzing on R side for a little over 3 weeks. Has not had prior issues like this, ENT consulted and recommended following up outpatient (not inpatient). Patient did not complain of any hearing loss or vision  changes.   Chronic conditions remained stable History of DVT/PE on Xarelto History of recurrent ITP-Hgb 11.7 and platelets 160 6K Chronic back pain-on Robaxin.  PCP Follow Up ENT outpatient follow up for tinnitis Follow for obesity/prediabetes   Significant Procedures: None  Significant Labs and Imaging:  Recent Labs  Lab 03/26/21 1109 03/26/21 1123 03/27/21 0316 03/28/21 0120  WBC 9.7  --  8.5 7.6  HGB 11.8* 12.2 11.4* 11.7*  HCT 36.4 36.0 34.0* 34.9*  PLT 208  --  182 166   Recent Labs  Lab 03/26/21 1109 03/26/21 1123 03/27/21 0316 03/28/21 0120  NA 138 137 136 138  K 4.0 3.9 3.6 3.9  CL 106 105 104 103  CO2 23  --  25 26  GLUCOSE 134* 137* 117* 114*  BUN 9 10 10 12   CREATININE 0.52 0.40* 0.54 0.55  CALCIUM 9.1  --  9.1 9.2  ALKPHOS 55  --   --   --   AST 22  --   --   --   ALT 23  --   --   --   ALBUMIN 3.5  --   --   --     CT HEAD WO CONTRAST  Result Date: 03/26/2021 CLINICAL DATA:  Headache, chronic, new features or increased frequency EXAM: CT HEAD WITHOUT CONTRAST CT VENOGRAM HEAD TECHNIQUE: Contiguous axial images were obtained from the base of the skull through the vertex without intravenous contrast. Venographic phase images of the brain were obtained following the administration of intravenous contrast. Multiplanar reformats and maximum intensity projections were generated. CONTRAST:  70mL OMNIPAQUE IOHEXOL 350 MG/ML SOLN COMPARISON:  10/13/2019. FINDINGS: CT HEAD Brain: No evidence of  acute large vascular territory infarct, acute hemorrhage, mass lesion, mass effect, or visible extra-axial fluid collection. No hydrocephalus. Similar scattered parenchymal calcifications. Partially empty and expanded sella, similar. Vascular: No hyperdense vessel identified. Skull: No acute fracture. Sinuses/orbits: Mild paranasal sinus mucosal thickening. No acute orbital findings. Other: No mastoid effusions. CT VENOGRAM No evidence of dural venous sinus thrombosis. The  superior sagittal sinus, transverse, sigmoid, and straight sinuses are patent. Narrowing of the distal transverse sinuses bilaterally, left more so than right. The visible deep veins are patent. The jugular bulbs and visualized upper internal jugular veins are patent. IMPRESSION: 1. No evidence of acute intracranial abnormality. 2. No evidence of dural venous sinus thrombosis. 3. Partially empty and expanded sella and narrowing of bilateral transverse sinuses. These findings are frequently normal anatomic variants, but can be seen with idiopathic intracranial hypertension in the correct clinical setting. Electronically Signed   By: Margaretha Sheffield M.D.   On: 03/26/2021 12:26   CT VENOGRAM HEAD  Result Date: 03/26/2021 CLINICAL DATA:  Headache, chronic, new features or increased frequency EXAM: CT HEAD WITHOUT CONTRAST CT VENOGRAM HEAD TECHNIQUE: Contiguous axial images were obtained from the base of the skull through the vertex without intravenous contrast. Venographic phase images of the brain were obtained following the administration of intravenous contrast. Multiplanar reformats and maximum intensity projections were generated. CONTRAST:  39mL OMNIPAQUE IOHEXOL 350 MG/ML SOLN COMPARISON:  10/13/2019. FINDINGS: CT HEAD Brain: No evidence of acute large vascular territory infarct, acute hemorrhage, mass lesion, mass effect, or visible extra-axial fluid collection. No hydrocephalus. Similar scattered parenchymal calcifications. Partially empty and expanded sella, similar. Vascular: No hyperdense vessel identified. Skull: No acute fracture. Sinuses/orbits: Mild paranasal sinus mucosal thickening. No acute orbital findings. Other: No mastoid effusions. CT VENOGRAM No evidence of dural venous sinus thrombosis. The superior sagittal sinus, transverse, sigmoid, and straight sinuses are patent. Narrowing of the distal transverse sinuses bilaterally, left more so than right. The visible deep veins are patent. The  jugular bulbs and visualized upper internal jugular veins are patent. IMPRESSION: 1. No evidence of acute intracranial abnormality. 2. No evidence of dural venous sinus thrombosis. 3. Partially empty and expanded sella and narrowing of bilateral transverse sinuses. These findings are frequently normal anatomic variants, but can be seen with idiopathic intracranial hypertension in the correct clinical setting. Electronically Signed   By: Margaretha Sheffield M.D.   On: 03/26/2021 12:26   VAS [place] LOWER EXTREMITY VENOUS (DVT)  Result Date: 03/27/2021  Lower Venous DVT Study Patient Name:  RENO FONG  Date of Exam:   03/27/2021 Medical Rec #: [id number]                 Accession #:    [id number] Date of Birth: 11-17-1975                  Patient Gender: F Patient Age:   57 years Exam Location:  [facility] Procedure:      VAS [place] LOWER EXTREMITY VENOUS (DVT) Referring Phys: Gwyndolyn Saxon HENSEL --------------------------------------------------------------------------------  Indications: Left knee pain, history of DVT/May-Thurners.  Risk Factors: Surgery 10-08-2019 Left lower extremity mechanical thrombectomy and left iliac stenting. Anticoagulation: Xarelto. Limitations: Poor ultrasound/tissue interface. Comparison Study: 11-11-2019 IVC/iliac left was limited, but negative for DVT                    10-14-2019 Left lower venous was positive for CHRONIC DVT  involving the mid-distal femoral vein and age indeterminate                   DVT involvinf the PTV/Pero V. Performing Technologist: Darlin Coco RDMS, RVT  Examination Guidelines: A complete evaluation includes B-mode imaging, spectral Doppler, color Doppler, and power Doppler as needed of all accessible portions of each vessel. Bilateral testing is considered an integral part of a complete examination. Limited examinations for reoccurring indications may be performed as noted. The reflux portion of the exam is performed with the patient  in reverse Trendelenburg.  +-----+---------------+---------+-----------+----------+--------------+  RIGHT Compressibility Phasicity Spontaneity Properties Thrombus Aging  +-----+---------------+---------+-----------+----------+--------------+  CFV   Full            Yes       Yes                                    +-----+---------------+---------+-----------+----------+--------------+   +---------+---------------+---------+-----------+----------+--------------+  LEFT      Compressibility Phasicity Spontaneity Properties Thrombus Aging  +---------+---------------+---------+-----------+----------+--------------+  CFV       Full            Yes       Yes                                    +---------+---------------+---------+-----------+----------+--------------+  SFJ       Full                                                             +---------+---------------+---------+-----------+----------+--------------+  FV Prox   Full                                                             +---------+---------------+---------+-----------+----------+--------------+  FV Mid    Full                                                             +---------+---------------+---------+-----------+----------+--------------+  FV Distal Full                                                             +---------+---------------+---------+-----------+----------+--------------+  PFV       Full                                                             +---------+---------------+---------+-----------+----------+--------------+  POP  Full            Yes       Yes                                    +---------+---------------+---------+-----------+----------+--------------+  PTV       Full                                                             +---------+---------------+---------+-----------+----------+--------------+  PERO      Full                                                              +---------+---------------+---------+-----------+----------+--------------+  Gastroc   Full                                                             +---------+---------------+---------+-----------+----------+--------------+     Summary: RIGHT: - No evidence of common femoral vein obstruction.  LEFT: - There is no evidence of deep vein thrombosis in the lower extremity.  - No cystic structure found in the popliteal fossa.  *See table(s) above for measurements and observations.    Preliminary    DG FL GUIDED LUMBAR PUNCTURE  Result Date: 03/27/2021 CLINICAL DATA:  Headaches. EXAM: DIAGNOSTIC LUMBAR PUNCTURE UNDER FLUOROSCOPIC GUIDANCE COMPARISON:  CT angio 03/26/2021 FLUOROSCOPY TIME:  Fluoroscopy Time:  2.2 minutes Radiation Exposure Index (if provided by the fluoroscopic device): 100.7 mGy Number of Acquired Spot Images: 2 PROCEDURE: Informed consent was obtained from the patient prior to the procedure, including potential complications of headache, allergy, and pain. With the patient prone, the lower back was prepped with Betadine. 1% Lidocaine was used for local anesthesia. Lumbar puncture was performed at the L3-4 level using a 20 gauge needle with return of blood tinged CSF with an opening pressure of 26.7 cm water. 21.5 ml of CSF were obtained for laboratory studies. The patient tolerated the procedure well and there were no apparent complications. IMPRESSION: 1. Technically successful lumbar puncture. The opening pressure is equal to 26.7 cm of water. 21.5 cc of CSF were removed. Electronically Signed   By: Kerby Moors M.D.   On: 03/27/2021 15:08   CT ANGIO HEAD NECK W WO CM (CODE STROKE)  Result Date: 03/26/2021 CLINICAL DATA:  Neuro deficit, acute, stroke suspected EXAM: CT ANGIOGRAPHY HEAD AND NECK TECHNIQUE: Multidetector CT imaging of the head and neck was performed using the standard protocol during bolus administration of intravenous contrast. Multiplanar CT image reconstructions and MIPs  were obtained to evaluate the vascular anatomy. Carotid stenosis measurements (when applicable) are obtained utilizing NASCET criteria, using the distal internal carotid diameter as the denominator. CONTRAST:  59mL OMNIPAQUE IOHEXOL 350 MG/ML SOLN COMPARISON:  Same day CT head/venogram FINDINGS: CTA NECK FINDINGS Aortic arch: Great vessel origins are patent. Right carotid system: No  evidence of dissection, stenosis (50% or greater) or occlusion. Retropharyngeal course. Left carotid system: No evidence of dissection, stenosis (50% or greater) or occlusion. Retropharyngeal course. Vertebral arteries: Codominant. No evidence of dissection, stenosis (50% or greater) or occlusion. Skeleton: No evidence of acute abnormality on limited assessment. Other neck: No evidence of acute abnormality on limited assessment. Upper chest: Visualized lung apices are clear. Review of the MIP images confirms the above findings CTA HEAD FINDINGS Anterior circulation: Visualized intracranial ICAs, MCAs, and ACAs are patent without proximal hemodynamically significant stenosis. No aneurysm identified. Posterior circulation: Bilateral intradural vertebral arteries, basilar artery, and posterior cerebral arteries are patent without proximal hemodynamically significant stenosis. No aneurysm identified. Venous sinuses: As permitted by contrast timing, patent. Further evaluated on same day CT venogram. Review of the MIP images confirms the above findings IMPRESSION: No large vessel occlusion or proximal hemodynamically significant stenosis in the head or neck. Electronically Signed   By: Margaretha Sheffield M.D.   On: 03/26/2021 14:03     Results/Tests Pending at Time of Discharge: None  Discharge Medications:  Allergies as of 03/28/2021   No Known Allergies      Medication List     TAKE these medications    acetaminophen 325 MG tablet Commonly known as: TYLENOL Take 650 mg by mouth every 6 (six) hours as needed for mild pain or  headache.   fluticasone 50 MCG/ACT nasal spray Commonly known as: Flonase Place 1 spray into both nostrils daily.   methocarbamol 500 MG tablet Commonly known as: Robaxin Take 1 tablet (500 mg total) by mouth every 8 (eight) hours as needed for muscle spasms.   rivaroxaban 20 MG Tabs tablet Commonly known as: XARELTO Take 1 tablet (20 mg total) by mouth daily with supper.        Discharge Instructions: Please refer to Patient Instructions section of EMR for full details.  Patient was counseled important signs and symptoms that should prompt return to medical care, changes in medications, dietary instructions, activity restrictions, and follow up appointments.   Follow-Up Appointments:   Alen Bleacher, MD 03/28/2021, 4:09 PM PGY-1, [place]

## 2021-03-28 NOTE — Progress Notes (Signed)
Page sent to family medicine service stating that the patient did not have a prescription for Robaxin. Page stated that the patient uses the [facility] pharmacy at the [employer] location. Patient did not want to wait for return call from [facility] Medicine service. All discharge instructions and medications were reviewed with an in person interpreter. Patient stated understanding and denies any questions. IV removed with no complications and gauze dressing placed. Patient wanted to walk out to wait for her ride at the main entrance with her daughter.

## 2021-03-28 NOTE — Plan of Care (Signed)
  Problem: Safety: Goal: Ability to remain free from injury will improve Outcome: Not Progressing   Problem: Pain Managment: Goal: General experience of comfort will improve Outcome: Not Progressing   

## 2021-03-28 NOTE — Evaluation (Signed)
Physical Therapy Evaluation and Discharge Patient Details Name: Nikeya Holland MRN: [id number] DOB: 03-26-1975 Today's Date: 03/28/2021  History of Present Illness  46 y.o. female presenting 03/26/21 with right sided headache for the past three weeks and tinnitus. CT venogram of head which showed no acute abnormalities and CT angio head and neck which showed no large vessel occlusions or stenosis. ENT consulted with OP followup planned. 03/27/21 dopplers of bil LEs negative for DVT. 03/27/21 lumbar puncture  PMH is significant for recurrent refractory ITP, SAH, PE/DVT on Xarelto, May Thurner syndrome, and depression  Clinical Impression   Patient evaluated by Physical Therapy with no further acute PT needs identified. All education has been completed and the patient has no further questions.  See below for any follow-up Physical Therapy or equipment needs. PT is signing off. Thank you for this referral.        Recommendations for follow up therapy are one component of a multi-disciplinary discharge planning process, led by the attending physician.  Recommendations may be updated based on patient status, additional functional criteria and insurance authorization.  Follow Up Recommendations No PT follow up    Assistance Recommended at Discharge    Patient can return home with the following  Assistance with cooking/housework;Help with stairs or ramp for entrance    Equipment Recommendations None recommended by PT  Recommendations for Other Services       Functional Status Assessment       Precautions / Restrictions Precautions Precautions: None Restrictions Weight Bearing Restrictions: No      Mobility  Bed Mobility Overal bed mobility: Independent                  Transfers Overall transfer level: Independent Equipment used: None               General transfer comment: Overall no difficulty    Ambulation/Gait Ambulation/Gait assistance:  Supervision;Modified independent (Device/Increase time) Gait Distance (Feet): 200 Feet Assistive device: None Gait Pattern/deviations: Step-through pattern       General Gait Details: Occasionally reaching out for UE support on hallway rail or counter, but otherwise no difficulty  Stairs Stairs: Yes Stairs assistance: Min assist Stair Management: One rail Right;Step to pattern;Forwards (hand on PT's shoulder descending) Number of Stairs: 5 General stair comments: Slower, more guarded descent, but no overt difficulty  Wheelchair Mobility    Modified Rankin (Stroke Patients Only)       Balance                                             Pertinent Vitals/Pain Pain Assessment: Faces Faces Pain Scale: Hurts whole lot Pain Location: Headache Pain Descriptors / Indicators: Headache (Throbbing with activity) Pain Intervention(s): Monitored during session    Home Living Family/patient expects to be discharged to:: Private residence Living Arrangements: Spouse/significant other;Children;Other relatives Available Help at Discharge: Family Type of Home: Mobile home Home Access: Stairs to enter Entrance Stairs-Rails: Right Entrance Stairs-Number of Steps: 5   Home Layout: One level Home Equipment: None      Prior Function Prior Level of Function : Independent/Modified Independent                     Hand Dominance   Dominant Hand: Right    Extremity/Trunk Assessment   Upper Extremity Assessment Upper Extremity Assessment: Overall WFL for tasks assessed  Lower Extremity Assessment Lower Extremity Assessment: Overall WFL for tasks assessed       Communication   Communication: No difficulties;Prefers language other than English ([place]; daughter Armed forces operational officer, works at [employer]) assisted with interpretation as needed)  Cognition Arousal/Alertness: Awake/alert Behavior During Therapy: WFL for tasks assessed/performed Overall  Cognitive Status: Within Functional Limits for tasks assessed                                          General Comments General comments (skin integrity, edema, etc.): Reports has plenty of help at home if she needs it    Exercises     Assessment/Plan    PT Assessment Patient does not need any further PT services  PT Problem List         PT Treatment Interventions      PT Goals (Current goals can be found in the Care Plan section)  Acute Rehab PT Goals Patient Stated Goal: Agreeable to ambulating in the hallway PT Goal Formulation: All assessment and education complete, DC therapy    Frequency       Co-evaluation               AM-PAC PT "6 Clicks" Mobility  Outcome Measure Help needed turning from your back to your side while in a flat bed without using bedrails?: None Help needed moving from lying on your back to sitting on the side of a flat bed without using bedrails?: None Help needed moving to and from a bed to a chair (including a wheelchair)?: None Help needed standing up from a chair using your arms (e.g., wheelchair or bedside chair)?: None Help needed to walk in hospital room?: None Help needed climbing 3-5 steps with a railing? : A Little 6 Click Score: 23    End of Session   Activity Tolerance: Patient tolerated treatment well Patient left: with family/visitor present;Other (comment) (Managing independently in room)   PT Visit Diagnosis: Pain Pain - part of body:  (Headache)    Time: 2353-6144 PT Time Calculation (min) (ACUTE ONLY): 14 min   Charges:   PT Evaluation $PT Eval Low Complexity: 1 Low          Van Clines, PT  Acute Rehabilitation Services Pager [phone] Office [phone]   Levi Aland 03/28/2021, 2:31 PM

## 2021-03-30 LAB — CSF CULTURE W GRAM STAIN
Culture: NO GROWTH
Gram Stain: NONE SEEN

## 2021-04-22 IMAGING — CT CT HEAD WITHOUT CONTRAST
4 series · 16 of 47 positions shown, 18 images · non-contrast
Comparison: March 16, 2018 and September 20, 2015

CLINICAL DATA: Left-sided headache for 1 week.  Hypertension.

EXAM:
CT HEAD WITHOUT CONTRAST
TECHNIQUE: Contiguous axial images were obtained from the base of the skull
through the vertex without intravenous contrast.

[Series 3: head wo · axial · 0.39mm/px · z∈[-158,-38]mm · 7 of 33 slices shown, 9 images]
[im 5/33  brain]
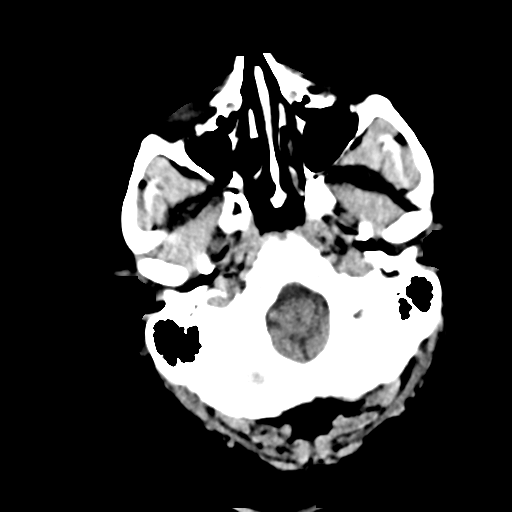
[im 5/33  bone]
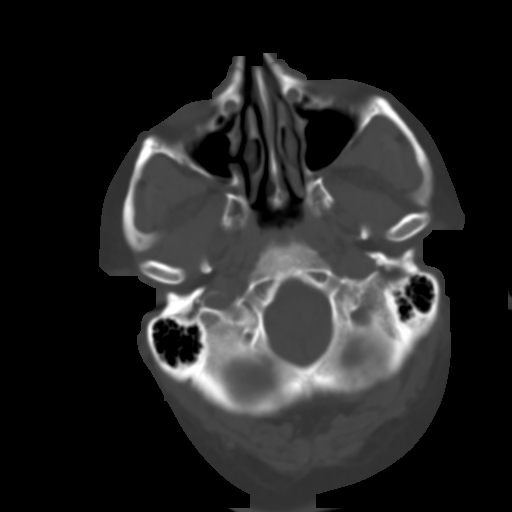
[im 9/33  brain]
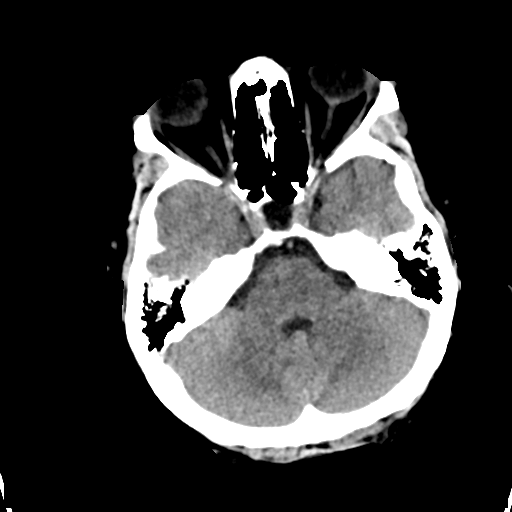
[im 13/33  brain]
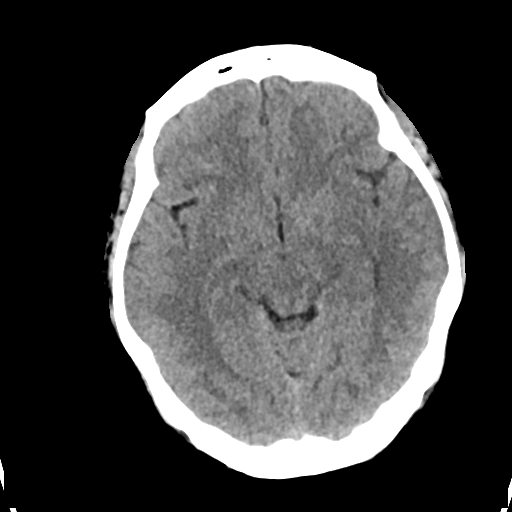
[im 17/33  brain]
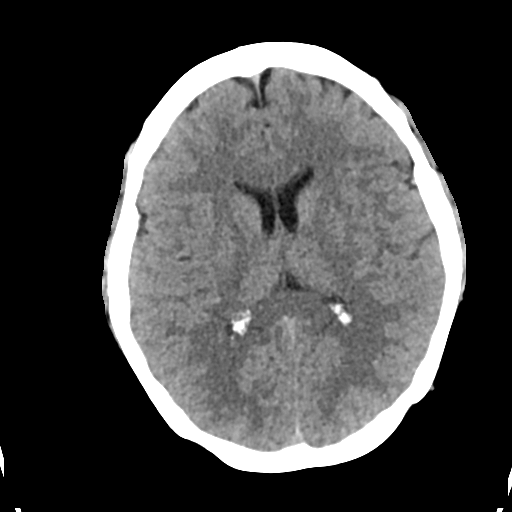
[im 21/33  brain]
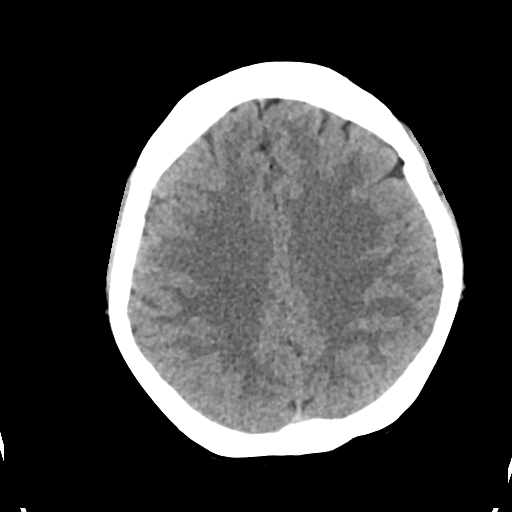
[im 21/33  bone]
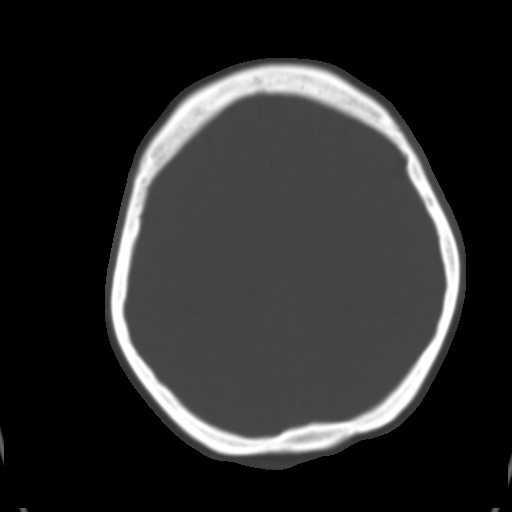
[im 25/33  brain]
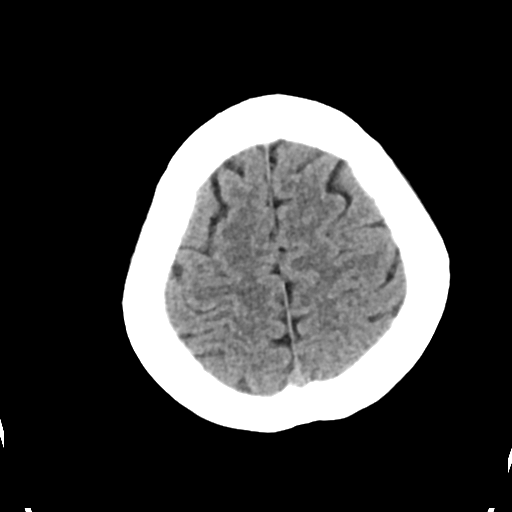
[im 29/33  brain]
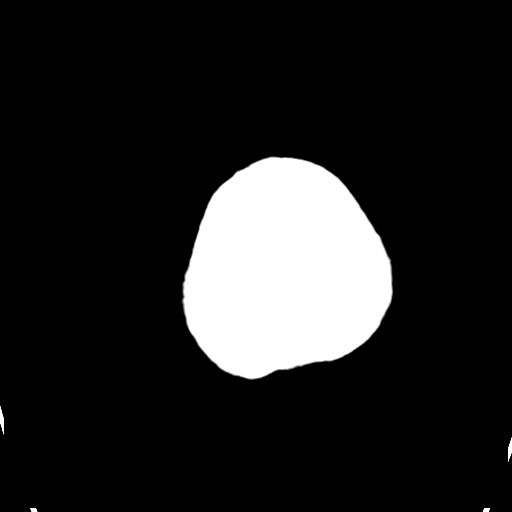

[Series 4: head bone · axial · 0.39mm/px · z∈[-162,-130]mm · 3 of 82 slices shown]
[im 9/82  bone]
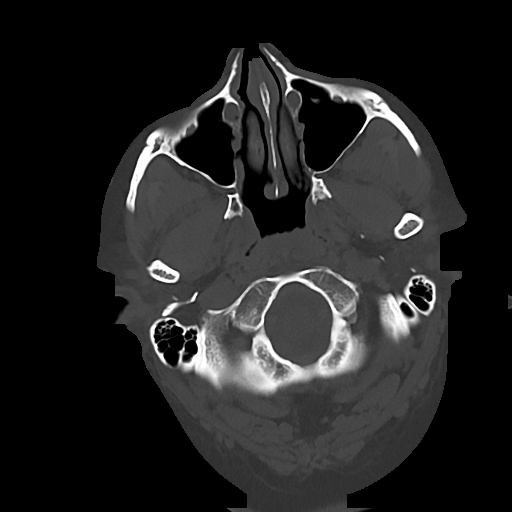
[im 17/82  bone]
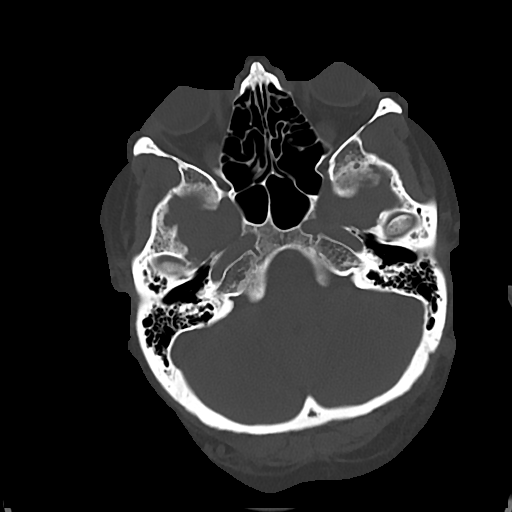
[im 25/82  bone]
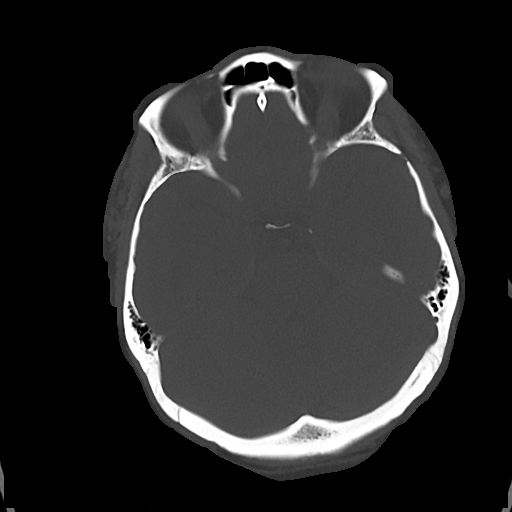

[Series 5: cor soft · coronal · 0.32mm/px · 3 of 67 slices shown]
[im 23/67  brain]
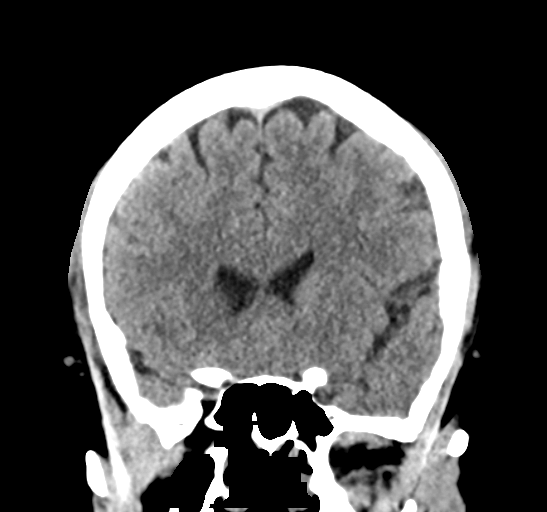
[im 30/67  brain]
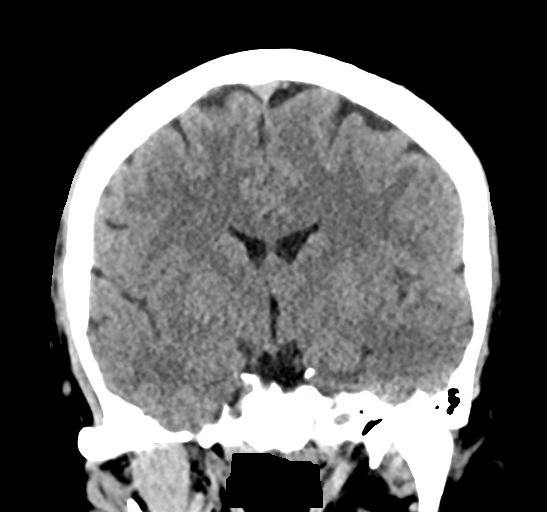
[im 37/67  brain]
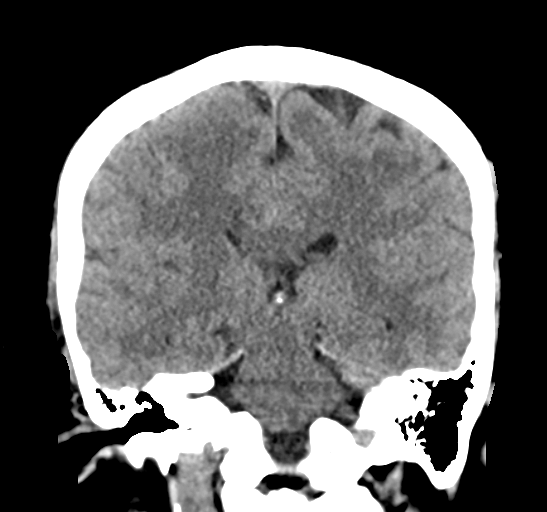

[Series 6: sag soft · sagittal · 0.32mm/px · 3 of 59 slices shown]
[im 20/59  brain]
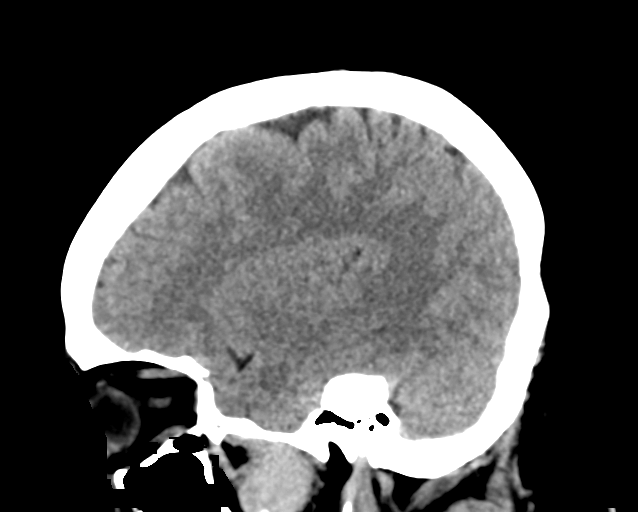
[im 30/59  brain]
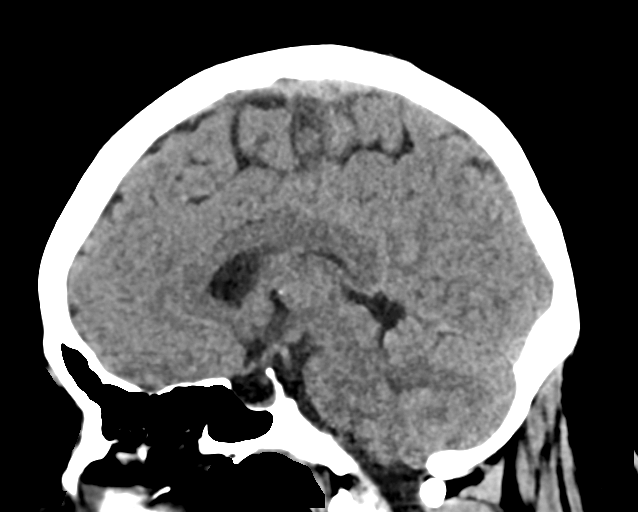
[im 39/59  brain]
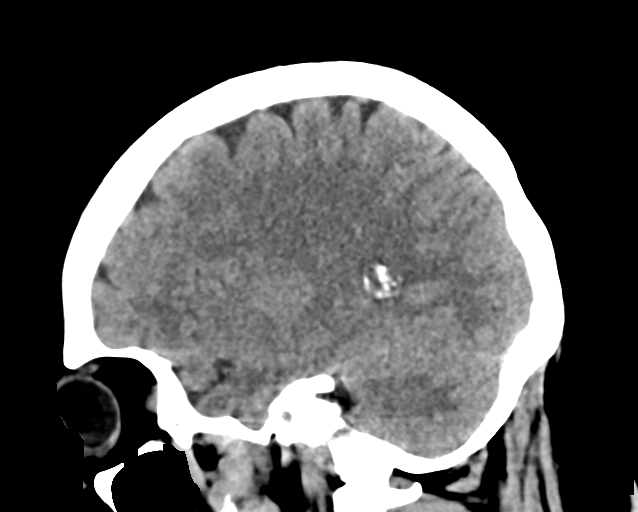

[16 of 47 positions shown; findings below may reference images not displayed]

FINDINGS: Brain: The ventricles are normal in size and configuration. There is
no evident mass. There is no demonstrable hemorrhage, extra-axial
fluid collection, or midline shift. Foci of increased attenuation in
each frontal-parietal lobe region, slightly more on the right than
on the left, is stable compared to prior studies, likely
representing focal calcification in these areas. There is no
surrounding edema. Elsewhere brain parenchyma appears unremarkable.
No evident acute infarct.

Vascular: No hyperdense vessel. No appreciable vascular
calcification evident.

Skull: Bony calvarium appears intact.

Sinuses/Orbits: There is a minimal retention cyst in anterior right
ethmoid air cell. There is also minimal mucosal thickening in
several ethmoid air cells. Other visualized paranasal sinuses are
clear. Orbits appear symmetric bilaterally.

Other: Mastoid air cells are clear.
IMPRESSION: 1. Stable areas of increased attenuation in the frontal-parietal
junction regions, slightly more on the right than on the left. The
stability of this appearance is felt to be due to calcification as
opposed to hemorrhage. Etiology for calcification in these areas is
uncertain. Residua of old trauma is possible. No acute appearing
hemorrhage evident at this time. No mass or edema. No acute infarct
evident.

2.  Minimal ethmoid sinus disease.

## 2021-08-31 ENCOUNTER — Emergency Department (HOSPITAL_COMMUNITY)
Admission: EM | Admit: 2021-08-31 | Discharge: 2021-08-31 | Disposition: A | Discharge status: Home / Self Care | Payer: Self-pay | Attending: Emergency Medicine | Admitting: Emergency Medicine

## 2021-08-31 ENCOUNTER — Other Ambulatory Visit: Payer: Self-pay

## 2021-08-31 ENCOUNTER — Encounter (HOSPITAL_COMMUNITY): Payer: Self-pay

## 2021-08-31 DIAGNOSIS — Z7901 Long term (current) use of anticoagulants: Secondary | ICD-10-CM | POA: Insufficient documentation

## 2021-08-31 DIAGNOSIS — M62830 Muscle spasm of back: Secondary | ICD-10-CM

## 2021-08-31 DIAGNOSIS — T148XXA Other injury of unspecified body region, initial encounter: Secondary | ICD-10-CM

## 2021-08-31 LAB — CBC WITH DIFFERENTIAL/PLATELET
Abs Immature Granulocytes: 0.05 10*3/uL (ref 0.00–0.07)
Basophils Absolute: 0 10*3/uL (ref 0.0–0.1)
Basophils Relative: 0 %
Eosinophils Absolute: 0.2 10*3/uL (ref 0.0–0.5)
Eosinophils Relative: 2 %
HCT: 37.9 % (ref 36.0–46.0)
Hemoglobin: 11.9 g/dL — ABNORMAL LOW (ref 12.0–15.0)
Immature Granulocytes: 1 %
Lymphocytes Relative: 32 %
Lymphs Abs: 2.9 10*3/uL (ref 0.7–4.0)
MCH: 27.7 pg (ref 26.0–34.0)
MCHC: 31.4 g/dL (ref 30.0–36.0)
MCV: 88.3 fL (ref 80.0–100.0)
Monocytes Absolute: 0.7 10*3/uL (ref 0.1–1.0)
Monocytes Relative: 8 %
Neutro Abs: 5.1 10*3/uL (ref 1.7–7.7)
Neutrophils Relative %: 57 %
Platelets: 377 10*3/uL (ref 150–400)
RBC: 4.29 MIL/uL (ref 3.87–5.11)
RDW: 15.3 % (ref 11.5–15.5)
WBC: 9.1 10*3/uL (ref 4.0–10.5)
nRBC: 0 % (ref 0.0–0.2)

## 2021-08-31 LAB — BASIC METABOLIC PANEL
Anion gap: 8 (ref 5–15)
BUN: 10 mg/dL (ref 6–20)
CO2: 27 mmol/L (ref 22–32)
Calcium: 9.1 mg/dL (ref 8.9–10.3)
Chloride: 102 mmol/L (ref 98–111)
Creatinine, Ser: 0.49 mg/dL (ref 0.44–1.00)
GFR, Estimated: >60 mL/min (ref >60)
Glucose, Bld: 113 mg/dL — ABNORMAL HIGH (ref 70–99)
Potassium: 3.9 mmol/L (ref 3.5–5.1)
Sodium: 137 mmol/L (ref 135–145)

## 2021-08-31 MED ORDER — METHYLPREDNISOLONE 4 MG PO TBPK: ORAL_TABLET | ORAL | 0 refills | Status: DC

## 2021-08-31 MED ORDER — DIAZEPAM 2 MG PO TABS
2.0000 mg | ORAL_TABLET | Freq: Once | ORAL | Status: AC
Start: 2021-08-31 — End: 2021-08-31
  Administered 2021-08-31: 2 mg via ORAL
  Filled 2021-08-31: qty 1

## 2021-08-31 MED ORDER — ACETAMINOPHEN 325 MG PO TABS
650.0000 mg | ORAL_TABLET | Freq: Once | ORAL | Status: AC
  Administered 2021-08-31: 650 mg via ORAL
  Filled 2021-08-31: qty 2

## 2021-08-31 MED ORDER — DEXAMETHASONE 4 MG PO TABS
6.0000 mg | ORAL_TABLET | Freq: Once | ORAL | Status: AC
  Administered 2021-08-31: 6 mg via ORAL
  Filled 2021-08-31: qty 2

## 2021-08-31 NOTE — ED Provider Notes (Signed)
[name]   CSN: [id number] Arrival date & time: 08/31/21  1108     History  Chief Complaint  Patient presents with   Back Pain    [name] is a 46 y.o. female.  Patient here with body aches, muscle spasm to the right upper back, right-sided neck, right lower back, left arm at times.  History of depression, chronic ITP, right lumbar radiculopathy, migraines.  Patient with history of DVT on Xarelto.  She has been good about taking this medication.  She denies any fever, chills, nausea, vomiting.  Denies any trauma.  May be some overuse.  Similar type pain in the past.  She has not had any major bleeding issues.  But has noticed sometimes she will have some mild bleeding of her gums.  She has tried Robaxin with some improvement.  Denies any chest pain, shortness of breath, other infectious symptoms.  The history is provided by the patient.       Home Medications Prior to Admission medications   Medication Sig Start Date End Date Taking? Authorizing Provider  methylPREDNISolone (MEDROL DOSEPAK) 4 MG TBPK tablet Follow package insert 08/31/21  Yes Crystalyn Delia, DO  acetaminophen (TYLENOL) 325 MG tablet Take 650 mg by mouth every 6 (six) hours as needed for mild pain or headache.    [provider]  fluticasone (FLONASE) 50 MCG/ACT nasal spray Place 1 spray into both nostrils daily. Patient not taking: Reported on 09/01/2020 08/23/20   [name], [name], MD  methocarbamol (ROBAXIN) 500 MG tablet Take 1 tablet (500 mg total) by mouth every 8 (eight) hours as needed for muscle spasms. 02/21/21   [name], [name], MD  rivaroxaban (XARELTO) 20 MG TABS tablet Take 1 tablet (20 mg total) by mouth daily with supper. 02/21/21   [name], [name], MD      Allergies    Patient has no known allergies.    Review of Systems   Review of Systems  Physical Exam Updated Vital Signs BP 94/72 (BP Location: Right Arm)   Pulse 63   Temp (!) 97.3  F (36.3 C) (Oral)   Resp 16   Ht 5' (1.524 m)   Wt 112 kg   SpO2 100%   BMI 48.24 kg/m  Physical Exam Vitals and nursing note reviewed.  Constitutional:      General: She is not in acute distress.    Appearance: She is well-developed.  HENT:     Head: Normocephalic and atraumatic.     Nose: Nose normal.     Mouth/Throat:     Mouth: Mucous membranes are moist.  Eyes:     Extraocular Movements: Extraocular movements intact.     Conjunctiva/sclera: Conjunctivae normal.     Pupils: Pupils are equal, round, and reactive to light.  Cardiovascular:     Rate and Rhythm: Normal rate and regular rhythm.     Pulses: Normal pulses.     Heart sounds: Normal heart sounds. No murmur heard. Pulmonary:     Effort: Pulmonary effort is normal. No respiratory distress.     Breath sounds: Normal breath sounds.  Abdominal:     Palpations: Abdomen is soft.     Tenderness: There is no abdominal tenderness.  Musculoskeletal:        General: Tenderness present. No swelling.     Cervical back: Normal range of motion and neck supple.     Right lower leg: No edema.     Left lower leg: No  edema.     Comments: Patient with tenderness to the paraspinal cervical muscles on the right, right upper trapezius muscle and throughout the paraspinal muscles on the right, some tenderness within the left bicep area with some range of motion of the left shoulder, there is no swelling of the arms or legs  Skin:    General: Skin is warm and dry.     Capillary Refill: Capillary refill takes less than 2 seconds.  Neurological:     General: No focal deficit present.     Mental Status: She is alert and oriented to person, place, and time.     Cranial Nerves: No cranial nerve deficit.     Sensory: No sensory deficit.     Motor: No weakness.     Coordination: Coordination normal.     Gait: Gait normal.     Comments: 5+ out of 5 strength throughout, normal sensation, no drift, normal finger-nose-finger, normal gait,  normal speech  Psychiatric:        Mood and Affect: Mood normal.     ED Results / Procedures / Treatments   Labs (all labs ordered are listed, but only abnormal results are displayed) Labs Reviewed  CBC WITH DIFFERENTIAL/PLATELET - Abnormal; Notable for the following components:      Result Value   Hemoglobin 11.9 (*)    All other components within normal limits  BASIC METABOLIC PANEL - Abnormal; Notable for the following components:   Glucose, Bld 113 (*)    All other components within normal limits    EKG None  Radiology No results found.  Procedures Procedures    Medications Ordered in ED Medications  dexamethasone (DECADRON) tablet 6 mg (has no administration in time range)  diazepam (VALIUM) tablet 2 mg (2 mg Oral Given 08/31/21 1728)  acetaminophen (TYLENOL) tablet 650 mg (650 mg Oral Given 08/31/21 1728)    ED Course/ Medical Decision Making/ A&P                           Medical Decision Making Amount and/or Complexity of Data Reviewed Labs: ordered.  Risk OTC drugs. Prescription drug management.   [name] is here mostly for back pain and muscle spasms.  Normal vitals.  No fever.  History of chronic ITP, migraines, depression, DVT on Xarelto.  Denies any specific triggers.  For the past week she has had some muscle tightness, soreness to the right side of her neck, right back, left upper back and left arm.  Denies any trauma, fever, chills.  Overall she has reproducible tenderness throughout the right sided paraspinal muscles of the cervical thoracic and lumbar spine.  No midline spinal tenderness.  No loss of bowel or bladder.  No numbness, weakness.  She had some right-sided headaches which she has had in the past.  She has had some left bicep pain as well.  Denies any new activities.  She has no swelling in her arms or legs.  Overall differential is likely muscle spasm/soft tissue process.  I have no concern for DVT she is on Xarelto and  compliant.  She has no signs on physical exam of blood clot/DVT.  She has normal strength and sensation throughout.  Normal neurologic exam.  Normal gait.  She might have a mild migraine with this as well.  Have no concern for head bleed or other intracranial process.  She has noticed some bleeding from her gums intermittently the last several months.  Has not had her platelets checked in 6 months.  Overall she appears well and will treat with a dose of Valium and likely steroids.  But will check CBC and BMP to evaluate for her platelets or any other electrolyte abnormalities.  She denies any nausea, vomiting, diarrhea.  She denies any trauma.  She does not have any bony tenderness on exam and do not think she needs any imaging at this time.  Per my review and interpretation of labs there is no significant anemia, electrolyte abnormality, kidney injury.  Platelets within normal limits.  Overall blood work is reassuring.  Her symptoms are much improved following a dose of Valium.  Gave her a dose of Decadron and will prescribe a Medrol Dosepak.  Overall suspect muscular pain.  Recommend follow-up with primary care doctor.  Discharge.  This chart was dictated using voice recognition software.  Despite best efforts to proofread,  errors can occur which can change the documentation meaning.         Final Clinical Impression(s) / ED Diagnoses Final diagnoses:  Muscle strain  Muscle spasm of back    Rx / DC Orders ED Discharge Orders          Ordered    methylPREDNISolone (MEDROL DOSEPAK) 4 MG TBPK tablet        08/31/21 1829              Virgina Norfolk, DO 08/31/21 1830

## 2021-08-31 NOTE — ED Triage Notes (Signed)
Pt arrived POV from home c/o right sided pain from her head down to her hip and then c/o left arm pain x1 week.

## 2021-08-31 NOTE — Discharge Instructions (Addendum)
Continue with your Robaxin as needed.  Recommend 1000 mg of Tylenol every 6 hours as needed for pain.  Take Medrol Dosepak as prescribed.  Take your next dose tomorrow

## 2021-08-31 NOTE — ED Provider Triage Note (Signed)
Emergency Medicine Provider Triage Evaluation Note  [name] , a 46 y.o. female  was evaluated in triage.  Pt complains of right sided neck and back pain, right leg pain, left arm pain x 8 days. Hx of right sided lumbar radiculopathy. No injuries.   Review of Systems  Positive: As above Negative: Abd pain, CP, urinary sx, numbness, constipation  Physical Exam  BP (!) 110/57 (BP Location: Right Wrist)   Pulse 73   Temp 98 F (36.7 C) (Oral)   Resp 15   SpO2 100%  Gen:   Awake, no distress   Resp:  Normal effort  MSK:   Moves extremities without difficulty  Other:  Normal gait  Medical Decision Making  Medically screening exam initiated at 12:00 PM.  Appropriate orders placed.  [name] was informed that the remainder of the evaluation will be completed by another provider, this initial triage assessment does not replace that evaluation, and the importance of remaining in the ED until their evaluation is complete.     Byran Bilotti T, PA-C 08/31/21 1203

## 2022-02-17 NOTE — Progress Notes (Deleted)
[facility]   [phone] [phone]   Clinic Follow up Note   Patient Care Team: Malachy Mood, MD as PCP - General (Hematology) Levert Feinstein, MD as Consulting Physician (Oncology) [name] (Hematology and Oncology) Elwin Mocha, MD as Consulting Physician (Ophthalmology) Justice Deeds, MD (Internal Medicine)  Date of Service:  02/17/2022  CHIEF COMPLAINT: f/u of refractory ITP, acute submassive bilateral PE, left DVT   CURRENT THERAPY:  -Promacta 25mg  daily starting 10/22/18. Stopped 04/08/18 due to recurrent flares.  -Nplate q2weeks starting in 2 weeks. Increased to weekly on 08/11/19 due to recurrent ITP flares. Will give her 2 mcg/kg if platelet normal, and increase to 3 mcg/kg if platelet less than 150K. Increased to 93mcg/kg starting 09/03/19. Increased to 93mcg/kg on 09/10/19 and increase again to 21mcg/kg starting 09/17/19. Due to DVT and PE, holding Nplate since 10/13/19 due to DVT unless plt <5K -Periodic use of steroids (decadron) and IVIG  -Rituxan weekly X4, completed 11/03/2019 -Coumadin starting 10/15/19, changed to 7.5mg  on Mondays, 5mg  daily for the rest of week on 12/04/2019  ASSESSMENT: *** [name] is a 46 y.o. female with   No problem-specific Assessment & Plan notes found for this encounter.  ***   PLAN:  SUMMARY OF ONCOLOGIC HISTORY: Oncology History   No history exists.     INTERVAL HISTORY: *** [name] is here for a follow up of refractory ITP, acute submassive bilateral PE, left DVT  She was last seen by me on 02/21/2021 She presents to the clinic    All other systems were reviewed with the patient and are negative.  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   Due to severe  thrombocytopenia from ITP    SURGICAL HISTORY: Past Surgical History:  Procedure Laterality Date   BREAST SURGERY  biopsy   COLONOSCOPY WITH PROPOFOL N/A 09/09/2020   Procedure: COLONOSCOPY WITH PROPOFOL;  Surgeon: 10/07/2015, MD;  Location: [facility]OS[facility];  Service: Gastroenterology;  Laterality: N/A;   LOWER EXTREMITY VENOGRAPHY Left 10/08/2019   Procedure: LOWER EXTREMITY VENOGRAPHY;  Surgeon: Sherrilyn Rist, MD;  Location: [facility] INVASIVE CV [facility];  Service: Cardiovascular;  Laterality: Left;   PERIPHERAL VASCULAR INTERVENTION Left 10/08/2019   Procedure: PERIPHERAL VASCULAR INTERVENTION;  Surgeon: Cephus Shelling, MD;  Location: [facility] INVASIVE CV LAB;  Service: Cardiovascular;  Laterality: Left;  LT COMMON ILIAC VEIN   PERIPHERAL VASCULAR THROMBECTOMY N/A 10/08/2019   Procedure: PERIPHERAL VASCULAR THROMBECTOMY;  Surgeon: Cephus Shelling, MD;  Location: [facility]VAS[facility] CV LAB;  Service: Cardiovascular;  Laterality: N/A;   SPLENECTOMY, TOTAL N/A 09/16/2015   Procedure: OPEN SPLENECTOMY;  Surgeon: Cephus Shelling III, MD;  Location: [facility] OR;  Service: General;  Laterality: N/A;    I have reviewed the social history and family history with the patient and they are unchanged from previous note.  ALLERGIES:  has No Known Allergies.  MEDICATIONS:  Current Outpatient Medications  Medication Sig Dispense Refill   acetaminophen (TYLENOL) 325 MG tablet Take 650 mg by mouth every 6 (six) hours as needed for mild pain or headache.     fluticasone (FLONASE) 50 MCG/ACT nasal spray Place 1 spray into both nostrils daily. (Patient not taking: Reported on 09/01/2020) 15.8 mL 1   methocarbamol (ROBAXIN) 500  MG tablet Take 1 tablet (500 mg total) by mouth every 8 (eight) hours as needed for muscle spasms. 30 tablet 0   methylPREDNISolone (MEDROL DOSEPAK) 4 MG TBPK tablet Follow package insert 21 each 0   rivaroxaban (XARELTO) 20 MG TABS tablet Take 1 tablet (20 mg total) by mouth daily with supper. 30  tablet 6   No current facility-administered medications for this visit.    PHYSICAL EXAMINATION: ECOG PERFORMANCE STATUS: {CHL ONC ECOG PS:[phone]}  There were no vitals filed for this visit. Wt Readings from Last 3 Encounters:  08/31/21 247 lb (112 kg)  03/26/21 246 lb 14.6 oz (112 kg)  02/21/21 251 lb (113.9 kg)    {Only keep what was examined. If exam not performed, can use .CEXAM } GENERAL:alert, no distress and comfortable SKIN: skin color, texture, turgor are normal, no rashes or significant lesions EYES: normal, Conjunctiva are pink and non-injected, sclera clear {OROPHARYNX:no exudate, no erythema and lips, buccal mucosa, and tongue normal}  NECK: supple, thyroid normal size, non-tender, without nodularity LYMPH:  no palpable lymphadenopathy in the cervical, axillary {or inguinal} LUNGS: clear to auscultation and percussion with normal breathing effort HEART: regular rate & rhythm and no murmurs and no lower extremity edema ABDOMEN:abdomen soft, non-tender and normal bowel sounds Musculoskeletal:no cyanosis of digits and no clubbing  NEURO: alert & oriented x 3 with fluent speech, no focal motor/sensory deficits  LABORATORY DATA:  I have reviewed the data as listed    Latest Ref Rng & Units 08/31/2021    5:14 PM 03/28/2021    1:20 AM 03/27/2021    3:16 AM  CBC  WBC 4.0 - 10.5 K/uL 9.1  7.6  8.5   Hemoglobin 12.0 - 15.0 g/dL 32.9  92.4  26.8   Hematocrit 36.0 - 46.0 % 37.9  34.9  34.0   Platelets 150 - 400 K/uL 377  166  182         Latest Ref Rng & Units 08/31/2021    5:14 PM 03/28/2021    1:20 AM 03/27/2021    3:16 AM  CMP  Glucose 70 - 99 mg/dL 341  962  229   BUN 6 - 20 mg/dL 10  12  10    Creatinine 0.44 - 1.00 mg/dL  7.98  9.21   Sodium 135 - 145 mmol/L 137  138  136   Potassium 3.5 - 5.1 mmol/L 3.9  3.9  3.6   Chloride 98 - 111 mmol/L 102  103  104   CO2 22 - 32 mmol/L 27  26  25    Calcium 8.9 - 10.3 mg/dL 9.1  9.2  9.1       RADIOGRAPHIC  STUDIES: I have personally reviewed the radiological images as listed and agreed with the findings in the report. No results found.    No orders of the defined types were placed in this encounter.  All questions were answered. The patient knows to call the clinic with any problems, questions or concerns. No barriers to learning was detected. The total time spent in the appointment was {CHL ONC TIME VISIT - 1.94.     , CMA 02/17/2022   I, Salome Holmes, CMA, am acting as scribe for 14/03/2021, MD.   {Add scribe attestation statement}

## 2022-02-19 NOTE — Assessment & Plan Note (Deleted)
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 She was bridged with Lovenox and discharged 10/10/19 on Coumadin.   -She had submassive bilateral PE on 10/13/19 CTA during her hospitalization for ITP flare. She was Discharged on 10/20/19 on Coumadin 7.5 mg daily. Dose adjusted as indicated.  -She was switched to Xarelto in 06/2020. She has been tolerating well, will continue.

## 2022-02-19 NOTE — Assessment & Plan Note (Deleted)
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  -last treatment was Rituxan in August 2021 -She has been on surveillance since then, platelet count has been normal.

## 2022-02-20 ENCOUNTER — Inpatient Hospital Stay: Payer: Self-pay

## 2022-02-20 ENCOUNTER — Inpatient Hospital Stay: Payer: Self-pay | Attending: Hematology | Admitting: Hematology

## 2022-02-20 DIAGNOSIS — I82492 Acute embolism and thrombosis of other specified deep vein of left lower extremity: Secondary | ICD-10-CM

## 2022-02-20 DIAGNOSIS — D693 Immune thrombocytopenic purpura: Secondary | ICD-10-CM

## 2022-09-24 ENCOUNTER — Emergency Department (HOSPITAL_COMMUNITY)
Admission: EM | Admit: 2022-09-24 | Discharge: 2022-09-24 | Disposition: A | Discharge status: Home / Self Care | Payer: Self-pay | Attending: Emergency Medicine | Admitting: Emergency Medicine

## 2022-09-24 ENCOUNTER — Emergency Department (HOSPITAL_COMMUNITY): Payer: Self-pay

## 2022-09-24 ENCOUNTER — Encounter (HOSPITAL_COMMUNITY): Payer: Self-pay | Admitting: Emergency Medicine

## 2022-09-24 ENCOUNTER — Other Ambulatory Visit: Payer: Self-pay

## 2022-09-24 DIAGNOSIS — R1012 Left upper quadrant pain: Secondary | ICD-10-CM | POA: Insufficient documentation

## 2022-09-24 DIAGNOSIS — E871 Hypo-osmolality and hyponatremia: Secondary | ICD-10-CM | POA: Insufficient documentation

## 2022-09-24 DIAGNOSIS — D72829 Elevated white blood cell count, unspecified: Secondary | ICD-10-CM | POA: Insufficient documentation

## 2022-09-24 LAB — COMPREHENSIVE METABOLIC PANEL
ALT: 26 U/L (ref 0–44)
AST: 21 U/L (ref 15–41)
Albumin: 3.5 g/dL (ref 3.5–5.0)
Alkaline Phosphatase: 86 U/L (ref 38–126)
Anion gap: 7 (ref 5–15)
BUN: 13 mg/dL (ref 6–20)
CO2: 23 mmol/L (ref 22–32)
Calcium: 8.7 mg/dL — ABNORMAL LOW (ref 8.9–10.3)
Chloride: 104 mmol/L (ref 98–111)
Creatinine, Ser: 0.6 mg/dL (ref 0.44–1.00)
GFR, Estimated: >60 mL/min (ref >60)
Glucose, Bld: 134 mg/dL — ABNORMAL HIGH (ref 70–99)
Potassium: 4 mmol/L (ref 3.5–5.1)
Sodium: 134 mmol/L — ABNORMAL LOW (ref 135–145)
Total Bilirubin: 0.3 mg/dL (ref 0.3–1.2)
Total Protein: 7 g/dL (ref 6.5–8.1)

## 2022-09-24 LAB — CBC
HCT: 38.1 % (ref 36.0–46.0)
Hemoglobin: 12 g/dL (ref 12.0–15.0)
MCH: 28.2 pg (ref 26.0–34.0)
MCHC: 31.5 g/dL (ref 30.0–36.0)
MCV: 89.6 fL (ref 80.0–100.0)
Platelets: 375 10*3/uL (ref 150–400)
RBC: 4.25 MIL/uL (ref 3.87–5.11)
RDW: 14.7 % (ref 11.5–15.5)
WBC: 11.9 10*3/uL — ABNORMAL HIGH (ref 4.0–10.5)
nRBC: 0 % (ref 0.0–0.2)

## 2022-09-24 LAB — LIPASE, BLOOD: Lipase: 46 U/L (ref 11–51)

## 2022-09-24 LAB — URINALYSIS, ROUTINE W REFLEX MICROSCOPIC
Bacteria, UA: NONE SEEN
Bilirubin Urine: NEGATIVE
Glucose, UA: NEGATIVE mg/dL
Ketones, ur: NEGATIVE mg/dL
Nitrite: NEGATIVE
Protein, ur: NEGATIVE mg/dL
Specific Gravity, Urine: 1.016 (ref 1.005–1.030)
pH: 6 (ref 5.0–8.0)

## 2022-09-24 LAB — HCG, SERUM, QUALITATIVE: Preg, Serum: NEGATIVE

## 2022-09-24 MED ORDER — ALUM & MAG HYDROXIDE-SIMETH 200-200-20 MG/5ML PO SUSP
30.0000 mL | Freq: Once | ORAL | Status: AC
  Administered 2022-09-24: 30 mL via ORAL
  Filled 2022-09-24: qty 30

## 2022-09-24 MED ORDER — LIDOCAINE VISCOUS HCL 2 % MT SOLN
15.0000 mL | Freq: Once | OROMUCOSAL | Status: AC
  Administered 2022-09-24: 15 mL via ORAL
  Filled 2022-09-24: qty 15

## 2022-09-24 MED ORDER — SUCRALFATE 1 G PO TABS: 1.0000 g | ORAL_TABLET | Freq: Three times a day (TID) | ORAL | 0 refills | Status: DC

## 2022-09-24 MED ORDER — PANTOPRAZOLE SODIUM 20 MG PO TBEC: 20.0000 mg | DELAYED_RELEASE_TABLET | Freq: Every day | ORAL | 0 refills | Status: DC

## 2022-09-24 MED ORDER — IOHEXOL 350 MG/ML SOLN
75.0000 mL | Freq: Once | INTRAVENOUS | Status: AC | PRN
  Administered 2022-09-24: 75 mL via INTRAVENOUS

## 2022-09-24 NOTE — ED Notes (Signed)
Patient transported to CT 

## 2022-09-24 NOTE — ED Triage Notes (Signed)
LUQ pain x for several months. Denies any v/d. Pt was seen by PCP and given famotidine 06/09/22 and no relief of pain. Pt states pain is worse today.

## 2022-09-24 NOTE — ED Provider Notes (Signed)
[facility] AT [facility] Provider Note   CSN: [id number] Arrival date & time: 09/24/22  1335     History  Chief Complaint  Patient presents with   Abdominal Pain    [name] is a 47 y.o. female.  With a history of anxiety, depression, ITP lumbar radiculopathy, hepatitis B, GERD who presents to the ED for evaluation of left-sided abdominal pain.  This is localized mostly to the left upper quadrant.  She has a history of splenectomy.  Her symptoms have been present for the past 2 months.  She has tried Tylenol and Pepcid daily for her symptoms without much improvement.  She states the pain got worse approximately 1 week ago.  She reports nausea but no vomiting.  Intermittent constipation but no diarrhea.  No fevers or chills.  No dysuria, frequency or urgency.  Pain is predictably worse after each time she eats.  History was provided by the patient with use of a Spanish interpreter.   Abdominal Pain Associated symptoms: nausea        Home Medications Prior to Admission medications   Medication Sig Start Date End Date Taking? Authorizing Provider  pantoprazole (PROTONIX) 20 MG tablet Take 1 tablet (20 mg total) by mouth daily. 09/24/22  Yes Abdo Denault, Edsel Petrin, PA-C  sucralfate (CARAFATE) 1 g tablet Take 1 tablet (1 g total) by mouth 4 (four) times daily -  with meals and at bedtime for 5 days. 09/24/22 09/29/22 Yes Raksha Wolfgang, Edsel Petrin, PA-C  acetaminophen (TYLENOL) 325 MG tablet Take 650 mg by mouth every 6 (six) hours as needed for mild pain or headache.    [provider]  fluticasone (FLONASE) 50 MCG/ACT nasal spray Place 1 spray into both nostrils daily. Patient not taking: Reported on 09/01/2020 08/23/20   Malachy Mood, MD  methocarbamol (ROBAXIN) 500 MG tablet Take 1 tablet (500 mg total) by mouth every 8 (eight) hours as needed for muscle spasms. 02/21/21   Malachy Mood, MD  methylPREDNISolone (MEDROL DOSEPAK) 4 MG TBPK tablet Follow  package insert 08/31/21   Curatolo, Adam, DO  rivaroxaban (XARELTO) 20 MG TABS tablet Take 1 tablet (20 mg total) by mouth daily with supper. 02/21/21   Malachy Mood, MD      Allergies    Patient has no known allergies.    Review of Systems   Review of Systems  Gastrointestinal:  Positive for abdominal pain and nausea.  All other systems reviewed and are negative.   Physical Exam Updated Vital Signs BP 101/69   Pulse 67   Temp 98.2 F (36.8 C) (Oral)   Resp 16   Ht 4\' 10"  (1.473 m)   Wt 112 kg   LMP 09/15/2022 (Approximate)   SpO2 96%   BMI 51.62 kg/m  Physical Exam Vitals and nursing note reviewed.  Constitutional:      General: She is not in acute distress.    Appearance: She is obese. She is not ill-appearing, toxic-appearing or diaphoretic.     Comments: Resting comfortably in bed  HENT:     Head: Normocephalic and atraumatic.  Eyes:     Conjunctiva/sclera: Conjunctivae normal.  Cardiovascular:     Rate and Rhythm: Normal rate and regular rhythm.     Heart sounds: No murmur heard. Pulmonary:     Effort: Pulmonary effort is normal. No respiratory distress.     Breath sounds: Normal breath sounds.  Abdominal:     Palpations: Abdomen is soft.  Tenderness: There is no abdominal tenderness. There is no guarding. Negative signs include Murphy's sign and McBurney's sign.  Musculoskeletal:        General: No swelling.     Cervical back: Neck supple.  Skin:    General: Skin is warm and dry.     Capillary Refill: Capillary refill takes less than 2 seconds.  Neurological:     Mental Status: She is alert.  Psychiatric:        Mood and Affect: Mood normal.     ED Results / Procedures / Treatments   Labs (all labs ordered are listed, but only abnormal results are displayed) Labs Reviewed  COMPREHENSIVE METABOLIC PANEL - Abnormal; Notable for the following components:      Result Value   Sodium 134 (*)    Glucose, Bld 134 (*)    Calcium 8.7 (*)    All other  components within normal limits  CBC - Abnormal; Notable for the following components:   WBC 11.9 (*)    All other components within normal limits  URINALYSIS, ROUTINE W REFLEX MICROSCOPIC - Abnormal; Notable for the following components:   Hgb urine dipstick SMALL (*)    Leukocytes,Ua SMALL (*)    All other components within normal limits  LIPASE, BLOOD  HCG, SERUM, QUALITATIVE    EKG None  Radiology CT ABDOMEN PELVIS W CONTRAST  Result Date: 09/24/2022 CLINICAL DATA:  Left lower quadrant pain EXAM: CT ABDOMEN AND PELVIS WITH CONTRAST TECHNIQUE: Multidetector CT imaging of the abdomen and pelvis was performed using the standard protocol following bolus administration of intravenous contrast. RADIATION DOSE REDUCTION: This exam was performed according to the departmental dose-optimization program which includes automated exposure control, adjustment of the mA and/or kV according to patient size and/or use of iterative reconstruction technique. CONTRAST:  75mL OMNIPAQUE IOHEXOL 350 MG/ML SOLN COMPARISON:  10/13/2019 FINDINGS: Lower chest: No acute abnormality Hepatobiliary: Diffuse low-density throughout the liver compatible with fatty infiltration. No focal abnormality. Gallbladder unremarkable. Pancreas: No focal abnormality or ductal dilatation. Spleen: Prior splenectomy Adrenals/Urinary Tract: No adrenal abnormality. No focal renal abnormality. No stones or hydronephrosis. Urinary bladder is unremarkable. Stomach/Bowel: Stomach, large and small bowel grossly unremarkable. Vascular/Lymphatic: No evidence of aneurysm or adenopathy. Left common iliac vein stent noted, stable. Reproductive: Uterus and adnexa unremarkable.  No mass. Other: No free fluid or free air. Musculoskeletal: No acute bony abnormality. IMPRESSION: No acute findings in the abdomen or pelvis. Prior splenectomy. Hepatic steatosis. Electronically Signed   By: Charlett Nose M.D.   On: 09/24/2022 18:11    Procedures Procedures     Medications Ordered in ED Medications  alum & mag hydroxide-simeth (MAALOX/MYLANTA) 200-200-20 MG/5ML suspension 30 mL (30 mLs Oral Given 09/24/22 1622)    And  lidocaine (XYLOCAINE) 2 % viscous mouth solution 15 mL (15 mLs Oral Given 09/24/22 1622)  iohexol (OMNIPAQUE) 350 MG/ML injection 75 mL (75 mLs Intravenous Contrast Given 09/24/22 1809)    ED Course/ Medical Decision Making/ A&P                             Medical Decision Making Amount and/or Complexity of Data Reviewed Labs: ordered. Radiology: ordered.  Risk OTC drugs. Prescription drug management.  This patient presents to the ED for concern of left-sided abdominal pain, this involves an extensive number of treatment options, and is a complaint that carries with it a high risk of complications and morbidity.  The differential diagnosis  for generalized abdominal pain includes, but is not limited to AAA, gastroenteritis, appendicitis, Bowel obstruction, Bowel perforation. Gastroparesis, DKA, Hernia, Inflammatory bowel disease, mesenteric ischemia, pancreatitis, peritonitis SBP, volvulus.   Co morbidities that complicate the patient evaluation  anxiety, depression, ITP lumbar radiculopathy, hepatitis B, GERD, splenectomy  My initial workup includes labs, imaging, GI cocktail  Additional history obtained from: Nursing notes from this visit.  I ordered, reviewed and interpreted labs which include: CMP, CBC, urinalysis, hCG, lipase.  Leukocytosis of 11.9.  Right emergency department hyperglycemia 134.  Hyponatremia 134.  I ordered imaging studies including CT abdomen pelvis with contrast I independently visualized and interpreted imaging which showed no acute findings I agree with the radiologist interpretation  Afebrile, hemodynamically stable.  47 year old female presenting to the ED for evaluation of left upper quadrant abdominal pain.  This been present for quite some time now.  It is particularly worse with eating.   She has been on Pepcid and states that this has not helped.  She appears well on physical exam.  She has no abdominal TTP.  Her lab workup was reassuring.  Imaging was obtained localization of pain to her previous operative site.  Imaging was reassuring and showed no abnormalities.  Overall I do suspect this is some gastritis as it is predictably worse with eating and improved with a GI cocktail.  Will start patient on Protonix and Carafate and encouraged her to follow-up with her primary care provider within the next week for reevaluation.  She was given strict return precautions.  Stable at discharge.  At this time there does not appear to be any evidence of an acute emergency medical condition and the patient appears stable for discharge with appropriate outpatient follow up. Diagnosis was discussed with patient who verbalizes understanding of care plan and is agreeable to discharge. I have discussed return precautions with patient who verbalizes understanding. Patient encouraged to follow-up with their PCP within 1 week. All questions answered.  Note: Portions of this report may have been transcribed using voice recognition software. Every effort was made to ensure accuracy; however, inadvertent computerized transcription errors may still be present.        Final Clinical Impression(s) / ED Diagnoses Final diagnoses:  Left upper quadrant abdominal pain    Rx / DC Orders ED Discharge Orders          Ordered    pantoprazole (PROTONIX) 20 MG tablet  Daily        09/24/22 1837    sucralfate (CARAFATE) 1 g tablet  3 times daily with meals & bedtime        09/24/22 1837              Michelle Piper, PA-C 09/24/22 1853    Gloris Manchester, MD 09/24/22 [phone]

## 2022-09-24 NOTE — Discharge Instructions (Addendum)
You have been seen today for your complaint of abdominal pain. Your lab work was reassuring. Your imaging was reassuring. Your discharge medications include Protonix and Carafate.  These are medicines used to help with your symptoms.  Take them as prescribed and for the entire duration of the prescription. Home care instructions are as follows:  Avoid spicy, acidic or fried foods.  Sit upright for at least 30 minutes after eating Follow up with: Your primary care provider within the next week Please seek immediate medical care if you develop any of the following symptoms: No puede dejar de vomitar. Siente Chief Technology Officer en una sola parte del vientre, por ejemplo, en el lado derecho. Tiene heces con sangre, de color negro o con aspecto alquitranado. Tiene dificultad para respirar. Tiene dolor en el pecho. At this time there does not appear to be the presence of an emergent medical condition, however there is always the potential for conditions to change. Please read and follow the below instructions.  Do not take your medicine if  develop an itchy rash, swelling in your mouth or lips, or difficulty breathing; call 911 and seek immediate emergency medical attention if this occurs.  You may review your lab tests and imaging results in their entirety on your MyChart account.  Please discuss all results of fully with your primary care provider and other specialist at your follow-up visit.  Note: Portions of this text may have been transcribed using voice recognition software. Every effort was made to ensure accuracy; however, inadvertent computerized transcription errors may still be present.

## 2022-12-12 ENCOUNTER — Telehealth: Payer: Self-pay

## 2022-12-12 ENCOUNTER — Inpatient Hospital Stay (HOSPITAL_BASED_OUTPATIENT_CLINIC_OR_DEPARTMENT_OTHER): Payer: Self-pay | Admitting: Nurse Practitioner

## 2022-12-12 ENCOUNTER — Other Ambulatory Visit: Payer: Self-pay

## 2022-12-12 ENCOUNTER — Inpatient Hospital Stay: Payer: Self-pay | Attending: Nurse Practitioner

## 2022-12-12 ENCOUNTER — Encounter: Payer: Self-pay | Admitting: Nurse Practitioner

## 2022-12-12 VITALS — BP 99/64 | HR 87 | Temp 98.4°F | Resp 19 | Ht <= 58 in | Wt 246.1 lb

## 2022-12-12 DIAGNOSIS — Z9081 Acquired absence of spleen: Secondary | ICD-10-CM | POA: Insufficient documentation

## 2022-12-12 DIAGNOSIS — Z7901 Long term (current) use of anticoagulants: Secondary | ICD-10-CM | POA: Insufficient documentation

## 2022-12-12 DIAGNOSIS — K068 Other specified disorders of gingiva and edentulous alveolar ridge: Secondary | ICD-10-CM | POA: Insufficient documentation

## 2022-12-12 DIAGNOSIS — Z86718 Personal history of other venous thrombosis and embolism: Secondary | ICD-10-CM | POA: Insufficient documentation

## 2022-12-12 DIAGNOSIS — R109 Unspecified abdominal pain: Secondary | ICD-10-CM | POA: Insufficient documentation

## 2022-12-12 DIAGNOSIS — Z86711 Personal history of pulmonary embolism: Secondary | ICD-10-CM | POA: Insufficient documentation

## 2022-12-12 DIAGNOSIS — Z8679 Personal history of other diseases of the circulatory system: Secondary | ICD-10-CM | POA: Insufficient documentation

## 2022-12-12 DIAGNOSIS — D693 Immune thrombocytopenic purpura: Secondary | ICD-10-CM

## 2022-12-12 DIAGNOSIS — I82402 Acute embolism and thrombosis of unspecified deep veins of left lower extremity: Secondary | ICD-10-CM

## 2022-12-12 DIAGNOSIS — K5900 Constipation, unspecified: Secondary | ICD-10-CM | POA: Insufficient documentation

## 2022-12-12 LAB — CBC WITH DIFFERENTIAL (CANCER CENTER ONLY)
Abs Immature Granulocytes: 0.03 10*3/uL (ref 0.00–0.07)
Basophils Absolute: 0 10*3/uL (ref 0.0–0.1)
Basophils Relative: 0 %
Eosinophils Absolute: 0.3 10*3/uL (ref 0.0–0.5)
Eosinophils Relative: 3 %
HCT: 36.6 % (ref 36.0–46.0)
Hemoglobin: 12 g/dL (ref 12.0–15.0)
Immature Granulocytes: 0 %
Lymphocytes Relative: 24 %
Lymphs Abs: 2.5 10*3/uL (ref 0.7–4.0)
MCH: 28.6 pg (ref 26.0–34.0)
MCHC: 32.8 g/dL (ref 30.0–36.0)
MCV: 87.1 fL (ref 80.0–100.0)
Monocytes Absolute: 0.9 10*3/uL (ref 0.1–1.0)
Monocytes Relative: 8 %
Neutro Abs: 6.7 10*3/uL (ref 1.7–7.7)
Neutrophils Relative %: 65 %
Platelet Count: 341 10*3/uL (ref 150–400)
RBC: 4.2 MIL/uL (ref 3.87–5.11)
RDW: 14.6 % (ref 11.5–15.5)
WBC Count: 10.5 10*3/uL (ref 4.0–10.5)
nRBC: 0 % (ref 0.0–0.2)

## 2022-12-12 MED ORDER — APIXABAN 5 MG PO TABS: 5.0000 mg | ORAL_TABLET | Freq: Two times a day (BID) | ORAL | 4 refills | Status: DC

## 2022-12-12 NOTE — Telephone Encounter (Signed)
Daughter, Bridget Hartshorn, called expressing concerns for her mother who was a patient of Dr. Mosetta Putt. Patient has been having increased gum bleeding and abdominal pain. Patient has been lost to follow-up and will come in for labs and appointment with Santiago Glad, NP today.  Appointments scheduled and family aware.  Confirmed that interpreter will be on-site to assist.

## 2022-12-12 NOTE — Progress Notes (Signed)
Patient Care Team: Malachy Mood, MD as PCP - General (Hematology) Levert Feinstein, MD as Consulting Physician (Oncology) [name] (Hematology and Oncology) Elwin Mocha, MD as Consulting Physician (Ophthalmology) Justice Deeds, MD (Internal Medicine)   CHIEF COMPLAINT: Symptom management, oral bleeding and abdominal pain in the setting of ITP  CURRENT THERAPY:  -Promacta 25mg  daily starting 10/22/18. Stopped 04/08/18 due to recurrent flares.  -Nplate q2weeks starting in 2 weeks. Increased to weekly on 08/11/19 due to recurrent ITP flares. Will give her 2 mcg/kg if platelet normal, and increase to 3 mcg/kg if platelet less than 150K. Increased to 31mcg/kg starting 09/03/19. Increased to 60mcg/kg on 09/10/19 and increase again to 27mcg/kg starting 09/17/19. Due to DVT and PE, holding Nplate since 10/13/19 due to DVT unless plt <5K -Periodic use of steroids (decadron) and IVIG  -Rituxan weekly X4, completed 11/03/2019 -Coumadin starting 10/15/19, changed to 7.5mg  on Mondays, 5mg  daily for the rest of week on 12/04/2019.  Changed to Xarelto 06/2020  INTERVAL HISTORY [name] returns for symptom management.  Last seen by Dr. Mosetta Putt 02/21/2021 and lost to follow-up.  Her daughter called with concerns for bleeding.  She reports oral/gum bleeding some during the day but more at night, will wake up with dried blood on her face and pillow.  She has not had any recent dental work or procedures.  She continues Xarelto.  She has some blood with bowel movements from constipation and straining which has been ongoing as well as left abdominal pain which she had in the past. She went to the ED for this in July, CT was unremarkable.  She has tried stool softener and MiraLAX without much help.  Still having periods, LMP 2 weeks ago, heavy for 2 days then light.  Denies other bleeding such as hematuria or epistaxis.  Denies leg edema, cough, dyspnea, or any other new or specific complaints.  ROS  All  other systems reviewed and negative  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     Past Surgical History:  Procedure Laterality Date   BREAST SURGERY  biopsy   COLONOSCOPY WITH PROPOFOL N/A 09/09/2020   Procedure: COLONOSCOPY WITH PROPOFOL;  Surgeon: Sherrilyn Rist, MD;  Location: [facility] ENDOSCOPY;  Service: Gastroenterology;  Laterality: N/A;   LOWER EXTREMITY VENOGRAPHY Left 10/08/2019   Procedure: LOWER EXTREMITY VENOGRAPHY;  Surgeon: Cephus Shelling, MD;  Location: [facility] IN[facility]AS[facility];  Service: Cardiovascular;  Laterality: Left;   PERIPHERAL VASCULAR INTERVENTION Left 10/08/2019   Procedure: PERIPHERAL VASCULAR INTERVENTION;  Surgeon: Cephus Shelling, MD;  Location: [facility] CV [facility];  Service: Cardiovascular;  Laterality: Left;  LT COMMON ILIAC VEIN   PERIPHERAL VASCULAR THROMBECTOMY N/A 10/08/2019   Procedure: PERIPHERAL VASCULAR THROMBECTOMY;  Surgeon: Cephus Shelling, MD;  Location: [facility] CV LAB;  Service: Cardiovascular;  Laterality: N/A;   SPLENECTOMY, TOTAL N/A 09/16/2015   Procedure: OPEN SPLENECTOMY;  Surgeon: Chevis Pretty III, MD;  Location: [facility] OR;  Service: General;  Laterality: N/A;     Outpatient Encounter Medications as of 12/12/2022  Medication Sig   acetaminophen (TYLENOL) 325 MG tablet Take 650 mg by mouth every 6 (six) hours as needed for mild pain or headache.   apixaban (ELIQUIS) 5 MG  TABS tablet Take 1 tablet (5 mg total) by mouth 2 (two) times daily.   fluticasone (FLONASE) 50 MCG/ACT nasal spray Place 1 spray into both nostrils daily. (Patient not taking: Reported on 09/01/2020)   methocarbamol (ROBAXIN) 500 MG tablet Take 1 tablet (500 mg total) by mouth every 8 (eight) hours as needed for muscle spasms. (Patient not taking: Reported on  12/12/2022)   methylPREDNISolone (MEDROL DOSEPAK) 4 MG TBPK tablet Follow package insert (Patient not taking: Reported on 12/12/2022)   pantoprazole (PROTONIX) 20 MG tablet Take 1 tablet (20 mg total) by mouth daily. (Patient not taking: Reported on 12/12/2022)   sucralfate (CARAFATE) 1 g tablet Take 1 tablet (1 g total) by mouth 4 (four) times daily -  with meals and at bedtime for 5 days.   [DISCONTINUED] rivaroxaban (XARELTO) 20 MG TABS tablet Take 1 tablet (20 mg total) by mouth daily with supper. (Patient not taking: Reported on 12/12/2022)   No facility-administered encounter medications on file as of 12/12/2022.     Today's Vitals   12/12/22 0900 12/12/22 0945  BP:  99/64  Pulse:  87  Resp:  19  Temp:  98.4 F (36.9 C)  TempSrc:  Oral  SpO2:  98%  Weight:  246 lb 1 oz (111.6 kg)  Height:  4\' 10"  (1.473 m)  PainSc: 9     Body mass index is 51.43 kg/m.   PHYSICAL EXAM GENERAL:alert, no distress and comfortable SKIN: no rash  EYES: sclera clear LUNGS: clear with normal breathing effort HEART: regular rate & rhythm, no lower extremity edema ABDOMEN: abdomen soft, non-tender and normal bowel sounds NEURO: alert & oriented x 3 with fluent speech, no focal motor deficits   CBC    Component Value Date/Time   WBC 10.5 12/12/2022 0929   WBC 11.9 (H) 09/24/2022 1429   RBC 4.20 12/12/2022 0929   HGB 12.0 12/12/2022 0929   HCT 36.6 12/12/2022 0929   HCT 27.3 (L) 09/21/2015 0900   PLT 341 12/12/2022 0929   MCV 87.1 12/12/2022 0929   MCH 28.6 12/12/2022 0929   MCHC 32.8 12/12/2022 0929   RDW 14.6 12/12/2022 0929   LYMPHSABS 2.5 12/12/2022 0929   MONOABS 0.9 12/12/2022 0929   EOSABS 0.3 12/12/2022 0929   BASOSABS 0.0 12/12/2022 0929     CMP     Component Value Date/Time   NA 134 (L) 09/24/2022 1429   K 4.0 09/24/2022 1429   CL 104 09/24/2022 1429   CO2 23 09/24/2022 1429   GLUCOSE 134 (H) 09/24/2022 1429   BUN 13 09/24/2022 1429   CREATININE 0.60 09/24/2022 1429    CREATININE 0.78 02/21/2021 0826   CALCIUM 8.7 (L) 09/24/2022 1429   PROT 7.0 09/24/2022 1429   ALBUMIN 3.5 09/24/2022 1429   AST 21 09/24/2022 1429   AST 15 02/21/2021 0826   ALT 26 09/24/2022 1429   ALT 18 02/21/2021 0826   ALKPHOS 86 09/24/2022 1429   BILITOT 0.3 09/24/2022 1429   BILITOT <0.2 (L) 02/21/2021 0826   GFRNONAA >60 09/24/2022 1429   GFRNONAA >60 02/21/2021 0826   GFRAA >60 12/04/2019 8657     ASSESSMENT & PLAN:  ITP -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 She responded to dexamethasone, IVIG, and Nplate quickly in  a few days.  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 Last seen 02/2021 -[name] appears stable. She presents back with oral bleeding. Plts 341K. This is possibly from xarelto. Will try eliquis and monitor -She does not need treatment for ITP at this time, with normal plt count.  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  She was bridged with Lovenox and discharged 10/10/19 on Coumadin.   -She was scheduled to begin outpatient Rituxan for refractory ITP on 7/26 but presented to ED with severe thrombocytopenia platelet less than 5K and gum bleeding.   -CTA showed at least submassive  bilateral PE. Vascular surgery did not recommend IVC filter due to risk for occluding her left iliac vein stent while off anticoagulation for the procedure, then occluding her IVC filter therefore leaving with no surgical options.   -Treated aggressively for ITP including platelet transfusion, 40 mg Dex daily for 4 days, IVIG, and Nplate.  Anticoagulation was held until platelets greater than 30 then received heparin without bolus.   -S/p first dose of Rituxan for refractory ITP on 7/29 in the hospital, plan for weekly x4.   -discharged on coumadin which was eventually changed to Xarelto in 2022 -Will change Xarelto to Eliquis due to oral bleeding -No signs of recurrent thrombosis   History of small foci of subarachnoid hemorrhage -recovered well overall. Will continue to monitor.    -multiple head CTs have been stable    Constipation, left abdominal pain -Chronic -ED visit in 09/2022, work up unremarkable -Stool softener and miralax are not helpful. I recommend mag citrate (1/2 bottle, then if no BM after 1 hour, complete the bottle and follow up with Dr. Myrtie Neither     PLAN: -July ED workup and today's labs reviewed, platelets are normal, 341 -Hold today's dose of Xarelto, change to Eliquis 5 mg BID due to bleeding, will start tomorrow -Reviewed symptom management for constipation, I recommend mag citrate and call her GI Dr. Myrtie Neither -Lab and follow-up in 3-4 months    All questions were answered. The patient knows to call the clinic with any problems, questions or concerns. No barriers to learning were detected. I spent 20 minutes counseling the patient face to face. The total time spent in the appointment was 30 minutes and more than 50% was on counseling, review of test results, and coordination of care.   Santiago Glad, NP-C 12/12/2022

## 2023-01-24 ENCOUNTER — Emergency Department (HOSPITAL_COMMUNITY): Payer: Self-pay

## 2023-01-24 ENCOUNTER — Emergency Department (HOSPITAL_COMMUNITY)
Admission: EM | Admit: 2023-01-24 | Discharge: 2023-01-24 | Disposition: A | Discharge status: Home / Self Care | Payer: Self-pay | Attending: Emergency Medicine | Admitting: Emergency Medicine

## 2023-01-24 DIAGNOSIS — Z7901 Long term (current) use of anticoagulants: Secondary | ICD-10-CM | POA: Insufficient documentation

## 2023-01-24 DIAGNOSIS — S60222A Contusion of left hand, initial encounter: Secondary | ICD-10-CM | POA: Insufficient documentation

## 2023-01-24 DIAGNOSIS — W230XXA Caught, crushed, jammed, or pinched between moving objects, initial encounter: Secondary | ICD-10-CM | POA: Insufficient documentation

## 2023-01-24 MED ORDER — ACETAMINOPHEN 500 MG PO TABS
1000.0000 mg | ORAL_TABLET | Freq: Once | ORAL | Status: AC
  Administered 2023-01-24: 1000 mg via ORAL
  Filled 2023-01-24: qty 2

## 2023-01-24 MED ORDER — IBUPROFEN 200 MG PO TABS: 600.0000 mg | ORAL_TABLET | Freq: Once | ORAL | Status: DC

## 2023-01-24 NOTE — ED Triage Notes (Signed)
Patients complains of left hand pain. Car door shut on her hand today. No abrasions, swelling or deformities observed. Patient can move her fingers and hand.

## 2023-01-24 NOTE — Discharge Instructions (Signed)
Return for any problem.  ?

## 2023-01-24 NOTE — ED Notes (Signed)
Pt called for room no answer someone in lobby stated pt left

## 2023-01-24 NOTE — ED Provider Notes (Signed)
[place] EMERGENCY DEPARTMENT AT [facility] Provider Note   CSN: [id number] Arrival date & time: 01/24/23  1913     History  No chief complaint on file.   [name] is a 47 y.o. female.  47 year old female with prior medical history as detailed below presents for evaluation.  Patient reports left hand injury after a car door shot on the hand.  Injury occurred earlier today.  She denies other injury.  Complains of pain to the left hand.  The history is provided by the patient and medical records.       Home Medications Prior to Admission medications   Medication Sig Start Date End Date Taking? Authorizing Provider  acetaminophen (TYLENOL) 325 MG tablet Take 650 mg by mouth every 6 (six) hours as needed for mild pain or headache.    [provider]  apixaban (ELIQUIS) 5 MG TABS tablet Take 1 tablet (5 mg total) by mouth 2 (two) times daily. 12/12/22   Pollyann Samples, NP  fluticasone (FLONASE) 50 MCG/ACT nasal spray Place 1 spray into both nostrils daily. Patient not taking: Reported on 09/01/2020 08/23/20   Malachy Mood, MD  methocarbamol (ROBAXIN) 500 MG tablet Take 1 tablet (500 mg total) by mouth every 8 (eight) hours as needed for muscle spasms. Patient not taking: Reported on 12/12/2022 02/21/21   Malachy Mood, MD  methylPREDNISolone (MEDROL DOSEPAK) 4 MG TBPK tablet Follow package insert Patient not taking: Reported on 12/12/2022 08/31/21   Virgina Norfolk, DO  pantoprazole (PROTONIX) 20 MG tablet Take 1 tablet (20 mg total) by mouth daily. Patient not taking: Reported on 12/12/2022 09/24/22   Michelle Piper, PA-C  sucralfate (CARAFATE) 1 g tablet Take 1 tablet (1 g total) by mouth 4 (four) times daily -  with meals and at bedtime for 5 days. 09/24/22 09/29/22  Schutt, Edsel Petrin, PA-C      Allergies    Patient has no known allergies.    Review of Systems   Review of Systems  All other systems reviewed and are negative.   Physical  Exam Updated Vital Signs BP (!) 98/58 (BP Location: Left Arm)   Pulse (!) 59   Temp 98.4 F (36.9 C) (Oral)   Resp 18   Ht 5' (1.524 m)   Wt 112 kg   SpO2 98%   BMI 48.24 kg/m  Physical Exam Vitals and nursing note reviewed.  Constitutional:      General: She is not in acute distress.    Appearance: Normal appearance. She is well-developed.  HENT:     Head: Normocephalic and atraumatic.  Eyes:     Conjunctiva/sclera: Conjunctivae normal.     Pupils: Pupils are equal, round, and reactive to light.  Cardiovascular:     Rate and Rhythm: Normal rate and regular rhythm.     Heart sounds: Normal heart sounds.  Pulmonary:     Effort: Pulmonary effort is normal. No respiratory distress.     Breath sounds: Normal breath sounds.  Abdominal:     General: There is no distension.     Palpations: Abdomen is soft.     Tenderness: There is no abdominal tenderness.  Musculoskeletal:        General: No deformity. Normal range of motion.     Cervical back: Normal range of motion and neck supple.     Comments: Localizes maximal tenderness to the dorsal aspect of the left hand.  No ecchymosis, contusion noted.  No bleeding noted.  No break in the skin noted.  Full active range of motion of the left hand, left elbow, left shoulder noted.  Skin:    General: Skin is warm and dry.  Neurological:     General: No focal deficit present.     Mental Status: She is alert and oriented to person, place, and time.     ED Results / Procedures / Treatments   Labs (all labs ordered are listed, but only abnormal results are displayed) Labs Reviewed - No data to display  EKG None  Radiology DG Hand Complete Left  Result Date: 01/24/2023 CLINICAL DATA:  Left hand pain after car door shut on hand today EXAM: LEFT HAND - COMPLETE 3+ VIEW COMPARISON:  None Available. FINDINGS: There is no evidence of fracture or dislocation. There is no evidence of arthropathy or other focal bone abnormality. Soft  tissues are unremarkable. IMPRESSION: Negative. Electronically Signed   By: Gaylyn Rong M.D.   On: 01/24/2023 20:59    Procedures Procedures    Medications Ordered in ED Medications  ibuprofen (ADVIL) tablet 600 mg (has no administration in time range)    ED Course/ Medical Decision Making/ A&P                                 Medical Decision Making Amount and/or Complexity of Data Reviewed Radiology: ordered.  Risk OTC drugs.    Medical Screen Complete  This patient presented to the ED with complaint of hand injury.  This complaint involves an extensive number of treatment options. The initial differential diagnosis includes, but is not limited to, fx, hand contusion  This presentation is: Acute, Self-Limited, and Previously Undiagnosed  Patient reports hand  injury/pain after slamming her left hand in a car door.  X-ray is without evidence of acute fracture.    Patient reassured by ED evaluation and workup.  Importance of close follow-up stressed.  Strict return precautions given and understood. Additional history obtained:  External records from outside sources obtained and reviewed including prior ED visits and prior Inpatient records.    Imaging Studies ordered:  I ordered imaging studies including left hand xr I independently visualized and interpreted obtained imaging which showed no fx I agree with the radiologist interpretation.  Problem List / ED Course:  Left hand contusion   Reevaluation:  After the interventions noted above, I reevaluated the patient and found that they have: improved   Disposition:  After consideration of the diagnostic results and the patients response to treatment, I feel that the patent would benefit from close outpatient followup.          Final Clinical Impression(s) / ED Diagnoses Final diagnoses:  Contusion of left hand, initial encounter    Rx / DC Orders ED Discharge Orders     None          Wynetta Fines, MD 01/24/23 2137

## 2023-04-09 ENCOUNTER — Other Ambulatory Visit: Payer: Self-pay | Admitting: Nurse Practitioner

## 2023-04-09 DIAGNOSIS — D693 Immune thrombocytopenic purpura: Secondary | ICD-10-CM

## 2023-04-09 NOTE — Progress Notes (Deleted)
Patient Care Team: Malachy Mood, MD as PCP - General (Hematology) Levert Feinstein, MD as Consulting Physician (Oncology) [name] (Hematology and Oncology) Elwin Mocha, MD as Consulting Physician (Ophthalmology) Justice Deeds, MD (Internal Medicine)   CHIEF COMPLAINT: Follow up ITP  CURRENT THERAPY:  -Promacta 25mg  daily starting 10/22/18. Stopped 04/08/18 due to recurrent flares.  -Nplate q2weeks starting in 2 weeks. Increased to weekly on 08/11/19 due to recurrent ITP flares. Will give her 2 mcg/kg if platelet normal, and increase to 3 mcg/kg if platelet less than 150K. Increased to 61mcg/kg starting 09/03/19. Increased to 2mcg/kg on 09/10/19 and increase again to 56mcg/kg starting 09/17/19. Due to DVT and PE, holding Nplate since 10/13/19 due to DVT unless plt <5K -Periodic use of steroids (decadron) and IVIG  -Rituxan weekly X4, completed 11/03/2019 -Coumadin starting 10/15/19, changed to 7.5mg  on Mondays, 5mg  daily for the rest of week on 12/04/2019.  Changed to Xarelto 06/2020 -Due to oral, vaginal, and rectal bleeding switched Xarelto to Eliquis 11/2022  INTERVAL HISTORY Ms. Antonieta Loveless returns for follow up as scheduled. Last seen by me 12/12/22 and switced Xarelto to Eliquis due to bleeding.   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     Past Surgical History:  Procedure Laterality Date   BREAST SURGERY  biopsy   COLONOSCOPY WITH PROPOFOL N/A 09/09/2020   Procedure: COLONOSCOPY WITH PROPOFOL;  Surgeon: Sherrilyn Rist, MD;  Location: [facility];  Service: Gastroenterology;  Laterality: N/A;   LOWER EXTREMITY VENOGRAPHY Left 10/08/2019   Procedure: LOWER EXTREMITY VENOGRAPHY;  Surgeon: Cephus Shelling, MD;  Location: [facility];   Service: Cardiovascular;  Laterality: Left;   PERIPHERAL VASCULAR INTERVENTION Left 10/08/2019   Procedure: PERIPHERAL VASCULAR INTERVENTION;  Surgeon: Cephus Shelling, MD;  Location: [facility];  Service: Cardiovascular;  Laterality: Left;  LT COMMON ILIAC VEIN   PERIPHERAL VASCULAR THROMBECTOMY N/A 10/08/2019   Procedure: PERIPHERAL VASCULAR THROMBECTOMY;  Surgeon: Cephus Shelling, MD;  Location: [facility];  Service: Cardiovascular;  Laterality: N/A;   SPLENECTOMY, TOTAL N/A 09/16/2015   Procedure: OPEN SPLENECTOMY;  Surgeon: Chevis Pretty III, MD;  Location: [facility] OR;  Service: General;  Laterality: N/A;     Outpatient Encounter Medications as of 04/10/2023  Medication Sig   acetaminophen (TYLENOL) 325 MG tablet Take 650 mg by mouth every 6 (six) hours as needed for mild pain or headache.   apixaban (ELIQUIS) 5 MG TABS tablet Take 1 tablet (5 mg total) by mouth 2 (two) times daily.   fluticasone (FLONASE) 50 MCG/ACT nasal spray Place 1 spray into both nostrils daily. (Patient not taking: Reported on 09/01/2020)   methocarbamol (ROBAXIN) 500 MG tablet Take 1 tablet (500 mg total) by mouth every 8 (eight) hours as needed for muscle spasms. (Patient not taking: Reported on 12/12/2022)   methylPREDNISolone (MEDROL DOSEPAK) 4 MG TBPK tablet Follow package insert (Patient not taking: Reported on 12/12/2022)   pantoprazole (PROTONIX) 20 MG tablet Take 1 tablet (20 mg total) by mouth daily. (Patient not taking: Reported on 12/12/2022)   sucralfate (CARAFATE) 1 g tablet Take 1 tablet (1 g total) by mouth 4 (four) times daily -  with meals and at bedtime for  5 days.   No facility-administered encounter medications on file as of 04/10/2023.     There were no vitals filed for this visit. There is no height or weight on file to calculate BMI.   PHYSICAL EXAM GENERAL:alert, no distress and comfortable SKIN: no rash  EYES: sclera clear NECK: without mass LYMPH:  no palpable cervical or  supraclavicular lymphadenopathy  LUNGS: clear with normal breathing effort HEART: regular rate & rhythm, no lower extremity edema ABDOMEN: abdomen soft, non-tender and normal bowel sounds NEURO: alert & oriented x 3 with fluent speech, no focal motor/sensory deficits Breast exam:  PAC without erythema    CBC    Component Value Date/Time   WBC 10.5 12/12/2022 0929   WBC 11.9 (H) 09/24/2022 1429   RBC 4.20 12/12/2022 0929   HGB 12.0 12/12/2022 0929   HCT 36.6 12/12/2022 0929   HCT 27.3 (L) 09/21/2015 0900   PLT 341 12/12/2022 0929   MCV 87.1 12/12/2022 0929   MCH 28.6 12/12/2022 0929   MCHC 32.8 12/12/2022 0929   RDW 14.6 12/12/2022 0929   LYMPHSABS 2.5 12/12/2022 0929   MONOABS 0.9 12/12/2022 0929   EOSABS 0.3 12/12/2022 0929   BASOSABS 0.0 12/12/2022 0929     CMP     Component Value Date/Time   NA 134 (L) 09/24/2022 1429   K 4.0 09/24/2022 1429   CL 104 09/24/2022 1429   CO2 23 09/24/2022 1429   GLUCOSE 134 (H) 09/24/2022 1429   BUN 13 09/24/2022 1429   CREATININE 0.60 09/24/2022 1429   CREATININE 0.78 02/21/2021 0826   CALCIUM 8.7 (L) 09/24/2022 1429   PROT 7.0 09/24/2022 1429   ALBUMIN 3.5 09/24/2022 1429   AST 21 09/24/2022 1429   AST 15 02/21/2021 0826   ALT 26 09/24/2022 1429   ALT 18 02/21/2021 0826   ALKPHOS 86 09/24/2022 1429   BILITOT 0.3 09/24/2022 1429   BILITOT <0.2 (L) 02/21/2021 0826   GFRNONAA >60 09/24/2022 1429   GFRNONAA >60 02/21/2021 0826   GFRAA >60 12/04/2019 9323     ASSESSMENT & PLAN: TP -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 She responded to dexamethasone, IVIG, and Nplate quickly in a few days.  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 Last seen 02/2021 -[name] appears stable. She presents back with oral bleeding. Plts 341K. This is possibly from xarelto. Will try eliquis and monitor -She does not need treatment for ITP at this time, with normal plt count.  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  She was bridged with Lovenox and discharged 10/10/19 on Coumadin.   -She was scheduled to begin outpatient Rituxan for refractory ITP on 7/26 but presented to ED with severe thrombocytopenia platelet less than 5K and gum bleeding.   -CTA  showed at least submassive bilateral PE. Vascular surgery did not recommend IVC filter due to risk for occluding her left iliac vein stent while off anticoagulation for the procedure, then occluding her IVC filter therefore leaving with no surgical options.   -Treated aggressively for ITP including platelet transfusion, 40 mg Dex daily for 4 days, IVIG, and Nplate.  Anticoagulation was held until platelets greater than 30 then received heparin without bolus.   -S/p first dose of Rituxan for refractory ITP on 7/29 in the hospital, plan for weekly x4.   -discharged on coumadin which was eventually changed to Xarelto in 2022 -Due to multiple sites of bleeding, changed to Eliquis 11/2022   History of small foci of subarachnoid hemorrhage -recovered well overall. Will continue to monitor.    -multiple head CTs have been stable     Constipation, left abdominal pain -Chronic -ED visit in 09/2022, work up  unremarkable -Stool softener and miralax are not helpful. I recommended mag citrate and follow up with Dr. Myrtie Neither       PLAN:  No orders of the defined types were placed in this encounter.     All questions were answered. The patient knows to call the clinic with any problems, questions or concerns. No barriers to learning were detected. I spent *** counseling the patient face to face. The total time spent in the appointment was *** and more than 50% was on counseling, review of test results, and coordination of care.   Santiago Glad, NP-C @DATE @

## 2023-04-10 ENCOUNTER — Other Ambulatory Visit: Payer: Self-pay

## 2023-04-10 ENCOUNTER — Inpatient Hospital Stay: Payer: Self-pay | Attending: Nurse Practitioner

## 2023-04-10 ENCOUNTER — Inpatient Hospital Stay: Payer: Self-pay | Admitting: Nurse Practitioner

## 2023-09-15 ENCOUNTER — Other Ambulatory Visit: Payer: Self-pay

## 2023-09-15 ENCOUNTER — Inpatient Hospital Stay (HOSPITAL_COMMUNITY)
Admission: EM | Admit: 2023-09-15 | Discharge: 2023-09-19 | DRG: 813 | Disposition: A | Discharge status: Home / Self Care | Payer: MEDICAID | Attending: Family Medicine | Admitting: Family Medicine

## 2023-09-15 ENCOUNTER — Encounter (HOSPITAL_COMMUNITY): Payer: Self-pay | Admitting: *Deleted

## 2023-09-15 DIAGNOSIS — Z555 Less than a high school diploma: Secondary | ICD-10-CM

## 2023-09-15 DIAGNOSIS — B181 Chronic viral hepatitis B without delta-agent: Secondary | ICD-10-CM | POA: Diagnosis present

## 2023-09-15 DIAGNOSIS — Z833 Family history of diabetes mellitus: Secondary | ICD-10-CM

## 2023-09-15 DIAGNOSIS — E66813 Obesity, class 3: Secondary | ICD-10-CM | POA: Diagnosis present

## 2023-09-15 DIAGNOSIS — Z6841 Body Mass Index (BMI) 40.0 and over, adult: Secondary | ICD-10-CM

## 2023-09-15 DIAGNOSIS — Z803 Family history of malignant neoplasm of breast: Secondary | ICD-10-CM

## 2023-09-15 DIAGNOSIS — K219 Gastro-esophageal reflux disease without esophagitis: Secondary | ICD-10-CM | POA: Diagnosis present

## 2023-09-15 DIAGNOSIS — Z7901 Long term (current) use of anticoagulants: Secondary | ICD-10-CM

## 2023-09-15 DIAGNOSIS — D649 Anemia, unspecified: Secondary | ICD-10-CM | POA: Diagnosis present

## 2023-09-15 DIAGNOSIS — Z9081 Acquired absence of spleen: Secondary | ICD-10-CM

## 2023-09-15 DIAGNOSIS — K068 Other specified disorders of gingiva and edentulous alveolar ridge: Secondary | ICD-10-CM | POA: Diagnosis present

## 2023-09-15 DIAGNOSIS — T451X5A Adverse effect of antineoplastic and immunosuppressive drugs, initial encounter: Secondary | ICD-10-CM | POA: Diagnosis not present

## 2023-09-15 DIAGNOSIS — Z86711 Personal history of pulmonary embolism: Secondary | ICD-10-CM

## 2023-09-15 DIAGNOSIS — L271 Localized skin eruption due to drugs and medicaments taken internally: Secondary | ICD-10-CM | POA: Diagnosis not present

## 2023-09-15 DIAGNOSIS — I10 Essential (primary) hypertension: Secondary | ICD-10-CM | POA: Diagnosis present

## 2023-09-15 DIAGNOSIS — Z8049 Family history of malignant neoplasm of other genital organs: Secondary | ICD-10-CM

## 2023-09-15 DIAGNOSIS — D693 Immune thrombocytopenic purpura: Principal | ICD-10-CM | POA: Diagnosis present

## 2023-09-15 DIAGNOSIS — Z86718 Personal history of other venous thrombosis and embolism: Secondary | ICD-10-CM

## 2023-09-15 LAB — CBC WITH DIFFERENTIAL/PLATELET
Abs Immature Granulocytes: 0.04 10*3/uL (ref 0.00–0.07)
Basophils Absolute: 0.1 10*3/uL (ref 0.0–0.1)
Basophils Relative: 1 %
Eosinophils Absolute: 0.1 10*3/uL (ref 0.0–0.5)
Eosinophils Relative: 1 %
HCT: 36.7 % (ref 36.0–46.0)
Hemoglobin: 12 g/dL (ref 12.0–15.0)
Immature Granulocytes: 0 %
Lymphocytes Relative: 29 %
Lymphs Abs: 3 10*3/uL (ref 0.7–4.0)
MCH: 28.6 pg (ref 26.0–34.0)
MCHC: 32.7 g/dL (ref 30.0–36.0)
MCV: 87.4 fL (ref 80.0–100.0)
Monocytes Absolute: 0.8 10*3/uL (ref 0.1–1.0)
Monocytes Relative: 8 %
Neutro Abs: 6.4 10*3/uL (ref 1.7–7.7)
Neutrophils Relative %: 61 %
Platelets: <5 10*3/uL — CL (ref 150–400)
RBC: 4.2 MIL/uL (ref 3.87–5.11)
RDW: 14.4 % (ref 11.5–15.5)
WBC: 10.5 10*3/uL (ref 4.0–10.5)
nRBC: 0 % (ref 0.0–0.2)

## 2023-09-15 LAB — COMPREHENSIVE METABOLIC PANEL WITH GFR
ALT: 20 U/L (ref 0–44)
AST: 20 U/L (ref 15–41)
Albumin: 3.4 g/dL — ABNORMAL LOW (ref 3.5–5.0)
Alkaline Phosphatase: 45 U/L (ref 38–126)
Anion gap: 8 (ref 5–15)
BUN: 9 mg/dL (ref 6–20)
CO2: 23 mmol/L (ref 22–32)
Calcium: 9.1 mg/dL (ref 8.9–10.3)
Chloride: 106 mmol/L (ref 98–111)
Creatinine, Ser: 0.52 mg/dL (ref 0.44–1.00)
GFR, Estimated: >60 mL/min (ref >60)
Glucose, Bld: 114 mg/dL — ABNORMAL HIGH (ref 70–99)
Potassium: 3.8 mmol/L (ref 3.5–5.1)
Sodium: 137 mmol/L (ref 135–145)
Total Bilirubin: 0.5 mg/dL (ref 0.0–1.2)
Total Protein: 6.8 g/dL (ref 6.5–8.1)

## 2023-09-15 LAB — APTT: aPTT: 35 s (ref 24–36)

## 2023-09-15 LAB — PROTIME-INR
INR: 1.1 (ref 0.8–1.2)
Prothrombin Time: 14.5 s (ref 11.4–15.2)

## 2023-09-15 MED ORDER — DEXAMETHASONE SODIUM PHOSPHATE 10 MG/ML IJ SOLN
40.0000 mg | Freq: Once | INTRAMUSCULAR | Status: AC
  Administered 2023-09-15: 40 mg via INTRAVENOUS
  Filled 2023-09-15: qty 4

## 2023-09-15 MED ORDER — SODIUM CHLORIDE 0.9 % IV SOLN
40.0000 mg | Freq: Every day | INTRAVENOUS | Status: AC
  Administered 2023-09-16 – 2023-09-19 (×4): 40 mg via INTRAVENOUS
  Filled 2023-09-15: qty 4
  Filled 2023-09-15: qty 3
  Filled 2023-09-15 (×4): qty 4

## 2023-09-15 MED ORDER — DEXAMETHASONE SODIUM PHOSPHATE 10 MG/ML IJ SOLN: 40.0000 mg | INTRAMUSCULAR | Status: DC

## 2023-09-15 MED ORDER — IMMUNE GLOBULIN (HUMAN) 10 GM/100ML IV SOLN
1.0000 g/kg | INTRAVENOUS | Status: AC
  Administered 2023-09-15 – 2023-09-16 (×2): 70 g via INTRAVENOUS
  Filled 2023-09-15 (×2): qty 700

## 2023-09-15 NOTE — ED Notes (Addendum)
 Pharmacist Cassondra and Caron RN to bedside to assist this RN with administration of Immune Globulin .

## 2023-09-15 NOTE — ED Notes (Signed)
 Platelets < 5 called as critical from lab

## 2023-09-15 NOTE — ED Triage Notes (Signed)
 The pt has a generalized rash over her body for one week today her mouth was bleeding  hx of  low platelets  2017 with these same symptoms  no hx of disease????  Lmp June 18th

## 2023-09-15 NOTE — ED Provider Triage Note (Signed)
 Emergency Medicine Provider Triage Evaluation Note  [name] , a 48 y.o. female  was evaluated in triage.  Pt complains of rash. States she has red dots all over her body and bleeding gums since today. Hx of ITP in 2017, required splenectomy for management. Rash consistent with this. Is supposed to be on Eliquis  for DVT but ran out a month ago. She feels normal otherwise  Review of Systems  Positive:  Negative:   Physical Exam  BP 92/63 (BP Location: Right Arm)   Pulse 68   Temp 98.6 F (37 C)   Resp 16   Ht 5' (1.524 m)   Wt 112 kg   LMP 09/05/2023   SpO2 99%   BMI 48.22 kg/m  Gen:   Awake, no distress   Resp:  Normal effort  MSK:   Moves extremities without difficulty  Other:  Petechial rash diffuse throughout, does not blanch.  Bleeding gums noted.  Medical Decision Making  Medically screening exam initiated at 3:32 PM.  Appropriate orders placed.  Trudi Morgenthaler was informed that the remainder of the evaluation will be completed by another provider, this initial triage assessment does not replace that evaluation, and the importance of remaining in the ED until their evaluation is complete.     Nora Lauraine DELENA DEVONNA 09/15/23 1535

## 2023-09-15 NOTE — H&P (Signed)
 History and Physical    Patient: [name] FMW:[id number] DOB: 1975-05-03 DOA: 09/15/2023 DOS: the patient was seen and examined on 09/15/2023 PCP: Lanny Callander, MD  Patient coming from: Home  Chief Complaint:  Chief Complaint  Patient presents with   Rash   HPI: [name] is a 48 y.o. female with medical history significant of chronic ITP, history of DVT and PE, currently not on anticoagulation depression, lumbar radiculopathy, history of previous hemorrhages from ITP including intracerebral and retinal hemorrhages who presented to the ER with generalized rashes all over her body and inside her mouth for about a week.  She had a similar episode in 2017 when she was diagnosed with ITP.  She has also had previous menorrhagia when she had similar episode.  Patient was found to have a platelet of only 5000.  Hematology was consulted.  Started on IVIG and patient has been admitted to the hospital for further evaluation and treatment.  Review of Systems: As mentioned in the history of present illness. All other systems reviewed and are negative. 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   Past Surgical History:  Procedure Laterality Date   BREAST SURGERY  biopsy   COLONOSCOPY WITH PROPOFOL  N/A 09/09/2020   Procedure: COLONOSCOPY WITH PROPOFOL ;  Surgeon: Legrand Victory LITTIE DOUGLAS, MD;  Location: [facility];  Service: Gastroenterology;  Laterality: N/A;   LOWER EXTREMITY VENOGRAPHY Left 10/08/2019   Procedure: LOWER EXTREMITY VENOGRAPHY;  Surgeon: Gretta Lonni PARAS, MD;  Location: [facility];  Service: Cardiovascular;  Laterality: Left;   PERIPHERAL VASCULAR INTERVENTION Left 10/08/2019   Procedure: PERIPHERAL VASCULAR INTERVENTION;  Surgeon: Gretta Lonni PARAS, MD;  Location: [facility];  Service: Cardiovascular;  Laterality: Left;  LT COMMON ILIAC VEIN   PERIPHERAL VASCULAR THROMBECTOMY N/A 10/08/2019   Procedure: PERIPHERAL VASCULAR THROMBECTOMY;  Surgeon: Gretta Lonni PARAS, MD;  Location: [facility];  Service: Cardiovascular;  Laterality: N/A;   SPLENECTOMY, TOTAL N/A 09/16/2015   Procedure: OPEN SPLENECTOMY;  Surgeon: Deward Null III, MD;  Location: [facility];  Service: General;  Laterality: N/A;   Social History:  reports that she has never smoked. She has never used smokeless tobacco. She reports that she does not drink alcohol and does not use drugs.  No Known Allergies  Family History  Problem Relation Age of Onset   Diabetes Mother    Breast cancer Mother    Diabetes Father    Uterine cancer Sister    Breast cancer Sister    Breast cancer Sister    Breast cancer Sister    Colon cancer Neg Hx    Pancreatic cancer Neg Hx    Esophageal cancer Neg Hx    Stomach cancer Neg Hx    Liver disease Neg Hx     Prior to Admission medications   Medication Sig Start Date End Date Taking? Authorizing Provider  acetaminophen  (TYLENOL ) 500 MG tablet Take 1,000 mg by mouth every 6 (six) hours as needed for mild pain (pain score 1-3) or headache.   Yes [provider]  apixaban  (ELIQUIS ) 5 MG TABS tablet Take 1 tablet (5 mg total) by mouth 2 (two) times daily. Patient not taking: Reported on 09/15/2023 12/12/22   Burton, Lacie K, NP  Physical Exam: Vitals:   09/15/23 2100 09/15/23 2213 09/15/23 2213 09/15/23 2230  BP: 113/80 (!) 90/57  111/72  Pulse: 70 74  80  Resp: 16 16  16   Temp:   98.6 F (37 C)   TempSrc:   Oral   SpO2: 100% 100%  100%  Weight:      Height:       Constitutional: NAD, calm, comfortable Eyes: PERRL, lids and conjunctivae normal ENMT: Mucous membranes are moist. Posterior pharynx clear of any exudate or lesions.Normal dentition.  Neck: normal, supple, no masses, no  thyromegaly Respiratory: clear to auscultation bilaterally, no wheezing, no crackles. Normal respiratory effort. No accessory muscle use.  Cardiovascular: Regular rate and rhythm, no murmurs / rubs / gallops. No extremity edema. 2+ pedal pulses. No carotid bruits.  Abdomen: no tenderness, no masses palpated. No hepatosplenomegaly. Bowel sounds positive.  Musculoskeletal: Good range of motion, no joint swelling or tenderness, Skin: n diffuse petechiae, no induration Neurologic: CN 2-12 grossly intact. Sensation intact, DTR normal. Strength 5/5 in all 4.  Psychiatric: Normal judgment and insight. Alert and oriented x 3. Normal mood  Data Reviewed:  Blood pressure 90/57, glucose 114, platelets of less than 5000  Assessment and Plan:  #1 severe thrombocytopenia: Chronic history of ITP.  Suspected this to be acute ITP.  Patient will be admitted.  Already given IVIG and hematology is to follow patient.  We are aware continue to follow their recommendations.  Will transfuse platelets if actively bleeding.  Patient also to start dexamethasone  as recommended.  #2 chronic hepatitis B: Stable.  Continue to monitor  #3 GERD: Not on PPIs.  May restart.    Advance Care Planning:   Code Status: Full Code   Consults: Oncology Dr. Pauletta Chihuahua  Family Communication: Daughter at bedside  Severity of Illness: The appropriate patient status for this patient is INPATIENT. Inpatient status is judged to be reasonable and necessary in order to provide the required intensity of service to ensure the patient's safety. The patient's presenting symptoms, physical exam findings, and initial radiographic and laboratory data in the context of their chronic comorbidities is felt to place them at high risk for further clinical deterioration. Furthermore, it is not anticipated that the patient will be medically stable for discharge from the hospital within 2 midnights of admission.   * I certify that at the point of  admission it is my clinical judgment that the patient will require inpatient hospital care spanning beyond 2 midnights from the point of admission due to high intensity of service, high risk for further deterioration and high frequency of surveillance required.*  AuthorBETHA SIM KNOLL, MD 09/15/2023 11:12 PM  For on call review [email or web address].

## 2023-09-15 NOTE — Progress Notes (Signed)
 Medical oncology brief  Received call from ED. Presented with platelet of 5. No other concerning symptoms.  Labs reviewed. Unremarkable CMP. Hgb 12, wbc 10.5.   Recommend start dexamethasone  40 mg iv daily tonight x 4 doses. IVIG 1 g/kg daily x 2 days.  Oncology will follow up tomorrow.

## 2023-09-15 NOTE — ED Provider Notes (Signed)
 [name] EMERGENCY DEPARTMENT AT [facility] Provider Note   CSN: [id number] Arrival date & time: 09/15/23  1452     Patient presents with: Rash   [name] is a 48 y.o. female.   HPI Presents with her daughter who assists with history.  Patient has a history of ITP, multiple other medical problems.  She notes that she ran out of her Eliquis  about 1 month ago, has been compliant with other medications.  Notes over the past week she has noticed recurrence of typical rash on her lower extremities as well as pallor around her face.    Prior to Admission medications   Medication Sig Start Date End Date Taking? Authorizing Provider  acetaminophen  (TYLENOL ) 325 MG tablet Take 650 mg by mouth every 6 (six) hours as needed for mild pain or headache.    [provider]  apixaban  (ELIQUIS ) 5 MG TABS tablet Take 1 tablet (5 mg total) by mouth 2 (two) times daily. 12/12/22   Burton, Lacie K, NP  fluticasone  (FLONASE ) 50 MCG/ACT nasal spray Place 1 spray into both nostrils daily. Patient not taking: Reported on 09/01/2020 08/23/20   Lanny Callander, MD  methocarbamol  (ROBAXIN ) 500 MG tablet Take 1 tablet (500 mg total) by mouth every 8 (eight) hours as needed for muscle spasms. Patient not taking: Reported on 12/12/2022 02/21/21   Lanny Callander, MD  methylPREDNISolone  (MEDROL  DOSEPAK) 4 MG TBPK tablet Follow package insert Patient not taking: Reported on 12/12/2022 08/31/21   Ruthe Cornet, DO  pantoprazole  (PROTONIX ) 20 MG tablet Take 1 tablet (20 mg total) by mouth daily. Patient not taking: Reported on 12/12/2022 09/24/22   Edwardo Marsa HERO, PA-C  sucralfate  (CARAFATE ) 1 g tablet Take 1 tablet (1 g total) by mouth 4 (four) times daily -  with meals and at bedtime for 5 days. 09/24/22 09/29/22  Schutt, Marsa HERO, PA-C    Allergies: Patient has no known allergies.    Review of Systems  Updated Vital Signs BP 120/72 (BP Location: Right Arm)   Pulse 64   Temp 98.7 F (37.1  C) (Oral)   Resp 17   Ht 5' (1.524 m)   Wt 112 kg   LMP 09/05/2023   SpO2 100%   BMI 48.22 kg/m   Physical Exam Vitals and nursing note reviewed.  Constitutional:      General: She is not in acute distress.    Appearance: She is well-developed.  HENT:     Head: Normocephalic and atraumatic.   Eyes:     Conjunctiva/sclera: Conjunctivae normal.    Cardiovascular:     Rate and Rhythm: Normal rate and regular rhythm.  Pulmonary:     Effort: Pulmonary effort is normal. No respiratory distress.     Breath sounds: Normal breath sounds. No stridor.  Abdominal:     General: There is no distension.   Skin:    General: Skin is warm and dry.     Findings: Rash present. Rash is purpuric.   Neurological:     Mental Status: She is alert and oriented to person, place, and time.     Cranial Nerves: No cranial nerve deficit.   Psychiatric:        Mood and Affect: Mood normal.     (all labs ordered are listed, but only abnormal results are displayed) Labs Reviewed  CBC WITH DIFFERENTIAL/PLATELET - Abnormal; Notable for the following components:      Result Value   Platelets <5 (*)  All other components within normal limits  COMPREHENSIVE METABOLIC PANEL WITH GFR - Abnormal; Notable for the following components:   Glucose, Bld 114 (*)    Albumin 3.4 (*)    All other components within normal limits  PROTIME-INR  APTT    EKG: None  Radiology: No results found.   Procedures   Medications Ordered in the ED  Immune Globulin  10% (PRIVIGEN ) IV infusion 70 g ( Intravenous Rate/Dose Change 09/15/23 2018)  dexamethasone  (DECADRON ) injection 40 mg (40 mg Intravenous Given 09/15/23 1841)                                    Medical Decision Making Patient with ITP, PE, hypertension, questionable recent medication compliance now presents with rash, pallor in her face, fatigue, immediate concern for ITP flare, labs are consistent with this with platelets less than 5. Vital  signs reassuring, pulse ox 100% room air normal cardiac 65 sinus normal.  Patient coming with her daughter who corroborates history. I discussed the patient's case with her oncology colleagues, patient is starting IVIG, Decadron , per protocol, and oncology will follow tomorrow morning.  Patient admitted to internal medicine colleagues.  Amount and/or Complexity of Data Reviewed External Data Reviewed: notes.    Details: Multiple oncology notes reviewed Labs:  Decision-making details documented in ED Course.  Risk Prescription drug management. Decision regarding hospitalization. Diagnosis or treatment significantly limited by social determinants of health.  8:48 PM After discussion with pharmacist, patient receiving IVIG, Decadron .  Patient remains hemodynamically similar.  CRITICAL CARE Performed by: Lamar Salen Total critical care time: 35 minutes Critical care time was exclusive of separately billable procedures and treating other patients. Critical care was necessary to treat or prevent imminent or life-threatening deterioration. Critical care was time spent personally by me on the following activities: development of treatment plan with patient and/or surrogate as well as nursing, discussions with consultants, evaluation of patient's response to treatment, examination of patient, obtaining history from patient or surrogate, ordering and performing treatments and interventions, ordering and review of laboratory studies, ordering and review of radiographic studies, pulse oximetry and re-evaluation of patient's condition.      Final diagnoses:  Acute ITP [facility])    ED Discharge Orders     None          Salen Lamar, MD 09/15/23 2048

## 2023-09-16 LAB — COMPREHENSIVE METABOLIC PANEL WITH GFR
ALT: 22 U/L (ref 0–44)
AST: 23 U/L (ref 15–41)
Albumin: 3.3 g/dL — ABNORMAL LOW (ref 3.5–5.0)
Alkaline Phosphatase: 52 U/L (ref 38–126)
Anion gap: 7 (ref 5–15)
BUN: 12 mg/dL (ref 6–20)
CO2: 22 mmol/L (ref 22–32)
Calcium: 9 mg/dL (ref 8.9–10.3)
Chloride: 103 mmol/L (ref 98–111)
Creatinine, Ser: 0.53 mg/dL (ref 0.44–1.00)
GFR, Estimated: >60 mL/min (ref >60)
Glucose, Bld: 187 mg/dL — ABNORMAL HIGH (ref 70–99)
Potassium: 3.8 mmol/L (ref 3.5–5.1)
Sodium: 132 mmol/L — ABNORMAL LOW (ref 135–145)
Total Bilirubin: 0.8 mg/dL (ref 0.0–1.2)
Total Protein: 8.6 g/dL — ABNORMAL HIGH (ref 6.5–8.1)

## 2023-09-16 LAB — HIV ANTIBODY (ROUTINE TESTING W REFLEX): HIV Screen 4th Generation wRfx: NONREACTIVE

## 2023-09-16 MED ORDER — ALBUTEROL SULFATE (2.5 MG/3ML) 0.083% IN NEBU: 2.5000 mg | INHALATION_SOLUTION | Freq: Once | RESPIRATORY_TRACT | Status: DC | PRN

## 2023-09-16 MED ORDER — ACETAMINOPHEN 325 MG PO TABS
650.0000 mg | ORAL_TABLET | Freq: Once | ORAL | Status: AC
  Administered 2023-09-16: 650 mg via ORAL
  Filled 2023-09-16: qty 2

## 2023-09-16 MED ORDER — METHYLPREDNISOLONE SODIUM SUCC 125 MG IJ SOLR: 125.0000 mg | Freq: Once | INTRAMUSCULAR | Status: DC | PRN

## 2023-09-16 MED ORDER — SODIUM CHLORIDE 0.9 % IV SOLN
375.0000 mg/m2 | Freq: Once | INTRAVENOUS | Status: AC
  Administered 2023-09-17: 800 mg via INTRAVENOUS
  Filled 2023-09-16: qty 80

## 2023-09-16 MED ORDER — SODIUM CHLORIDE 0.9 % IV BOLUS
1000.0000 mL | Freq: Once | INTRAVENOUS | Status: AC | PRN
  Administered 2023-09-17: 1000 mL via INTRAVENOUS

## 2023-09-16 MED ORDER — ONDANSETRON HCL 4 MG/2ML IJ SOLN: 4.0000 mg | Freq: Four times a day (QID) | INTRAMUSCULAR | Status: DC | PRN

## 2023-09-16 MED ORDER — FAMOTIDINE IN NACL 20-0.9 MG/50ML-% IV SOLN
20.0000 mg | Freq: Once | INTRAVENOUS | Status: AC | PRN
  Administered 2023-09-17: 20 mg via INTRAVENOUS
  Filled 2023-09-16: qty 50

## 2023-09-16 MED ORDER — METHYLPREDNISOLONE SODIUM SUCC 125 MG IJ SOLR
125.0000 mg | Freq: Once | INTRAMUSCULAR | Status: AC | PRN
  Administered 2023-09-17: 125 mg via INTRAVENOUS
  Filled 2023-09-16: qty 2

## 2023-09-16 MED ORDER — EPINEPHRINE PF 1 MG/ML IJ SOLN: 0.3000 mg | INTRAMUSCULAR | Status: DC | PRN

## 2023-09-16 MED ORDER — ACETAMINOPHEN 325 MG PO TABS
650.0000 mg | ORAL_TABLET | Freq: Once | ORAL | Status: AC
  Administered 2023-09-17: 650 mg via ORAL
  Filled 2023-09-16: qty 2

## 2023-09-16 MED ORDER — DIPHENHYDRAMINE HCL 25 MG PO CAPS
50.0000 mg | ORAL_CAPSULE | Freq: Once | ORAL | Status: AC
  Administered 2023-09-17: 50 mg via ORAL
  Filled 2023-09-16: qty 2

## 2023-09-16 MED ORDER — DIPHENHYDRAMINE HCL 50 MG/ML IJ SOLN
50.0000 mg | Freq: Once | INTRAMUSCULAR | Status: AC | PRN
  Administered 2023-09-17: 50 mg via INTRAVENOUS
  Filled 2023-09-16: qty 1

## 2023-09-16 MED ORDER — LACTATED RINGERS IV SOLN: INTRAVENOUS | Status: AC

## 2023-09-16 MED ORDER — ONDANSETRON HCL 4 MG PO TABS
4.0000 mg | ORAL_TABLET | Freq: Four times a day (QID) | ORAL | Status: DC | PRN
Start: 2023-09-16 — End: 2023-09-19

## 2023-09-16 NOTE — Plan of Care (Signed)
  Problem: Education: Goal: Knowledge of General Education information will improve Description: Including pain rating scale, medication(s)/side effects and non-pharmacologic comfort measures Outcome: Progressing   Problem: Health Behavior/Discharge Planning: Goal: Ability to manage health-related needs will improve Outcome: Progressing   Problem: Clinical Measurements: Goal: Ability to maintain clinical measurements within normal limits will improve Outcome: Progressing   Problem: Activity: Goal: Risk for activity intolerance will decrease Outcome: Progressing   Problem: Nutrition: Goal: Adequate nutrition will be maintained Outcome: Progressing   Problem: Elimination: Goal: Will not experience complications related to bowel motility Outcome: Progressing Goal: Will not experience complications related to urinary retention Outcome: Progressing   Problem: Pain Managment: Goal: General experience of comfort will improve and/or be controlled Outcome: Progressing   Problem: Safety: Goal: Ability to remain free from injury will improve Outcome: Progressing

## 2023-09-16 NOTE — Consult Note (Signed)
 [facility] Hematology and oncology consult note   Patient Care Team: Lanny Callander, MD as PCP - General (Hematology) Freddie Lynwood HERO, MD as Consulting Physician (Oncology) Dusty Rush (Hematology and Oncology) Maree Lonni Inks, MD as Consulting Physician (Ophthalmology) Nivia Charletta BIRCH, MD (Internal Medicine)   ASSESSMENT & PLAN:  48 y.o.female with past medical history of recurrent ITP, headaches, DVT, PE consulted for severe thrombocytopenia.  Patient is otherwise feeling well other than severe thrombocytopenia.  Platelet of less than 5 on admission with petechiae but without other concerning symptoms.  Clinically most consistent with recurrent ITP.  She started dexamethasone  40 mg with IVIG yesterday.  Platelet without much improvement.  Will start rituximab  today. Assessment & Plan Acute ITP (HCC) Recurrent with history of refractory ITP Day 2 of IVIG with dexamethasone .  No response. Will start rituximab  weekly first dose today.  Communicated with pharmacy  History of pulmonary embolism Hold on anticoagulation until platelets greater than 50.  All questions were answered.  Will communicate with primary oncologist for follow-up tomorrow.   Alice JAYSON Chihuahua, MD 09/16/2023 1:54 PM   CHIEF COMPLAINTS/PURPOSE OF ADMISSION Recurrent ITP  HISTORY OF PRESENTING ILLNESS:  [name] 48 y.o. female consulted for ITP. Patient is admitted for severe thrombocytopenia.  Patient noted petechiae yesterday and presented to the emergency room for evaluation.  On admission, platelet was less than 5.  She denies any nosebleed, bloody stool, bloody urine, vaginal bleeding.  Reports petechiae in the mouth and extremities.  Reports she is otherwise feeling well.  She denies any recent flu, viral-like illness, infection, coughing, shortness of breath, chest pain, abdominal pain, intra-abdominal infection, diarrhea, UTI symptoms.  She had brief headache yesterday and  resolved.  She is otherwise feeling well.  She was started on dexamethasone  with IVIG yesterday.  She last saw Dr. Lanny in 2022.  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  She received rituximab , followed by Nplate .  She developed PE and DVT and Nplate  was discontinued.  Her last PE was in July 2021.  On admission, she was on apixaban .   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    SURGICAL HISTORY: Past Surgical History:  Procedure Laterality Date   BREAST SURGERY  biopsy   COLONOSCOPY WITH PROPOFOL  N/A 09/09/2020   Procedure: COLONOSCOPY WITH PROPOFOL ;  Surgeon: Legrand Victory LITTIE DOUGLAS, MD;  Location: [facility];  Service: Gastroenterology;  Laterality: N/A;   LOWER EXTREMITY VENOGRAPHY Left 10/08/2019   Procedure: LOWER EXTREMITY VENOGRAPHY;  Surgeon: Gretta Lonni PARAS, MD;  Location: [facility] INVASIVE CV LAB;  Service: Cardiovascular;  Laterality: Left;   PERIPHERAL VASCULAR INTERVENTION Left 10/08/2019   Procedure: PERIPHERAL VASCULAR INTERVENTION;  Surgeon: Gretta Lonni PARAS, MD;  Location: [facility] INVASIVE CV LAB;  Service: Cardiovascular;  Laterality: Left;  LT COMMON ILIAC VEIN   PERIPHERAL VASCULAR THROMBECTOMY N/A 10/08/2019   Procedure: PERIPHERAL VASCULAR THROMBECTOMY;  Surgeon: Gretta Lonni PARAS, MD;  Location: [facility] INVASIVE CV LAB;  Service: Cardiovascular;  Laterality: N/A;   SPLENECTOMY, TOTAL N/A 09/16/2015   Procedure: OPEN SPLENECTOMY;  Surgeon: Deward Null III, MD;  Location: [facility] OR;  Service: General;  Laterality: N/A;    SOCIAL HISTORY: Social History   Socioeconomic History   Marital status:  Married    Spouse name: Not on file   Number of children: 6   Years of education: Not on file   Highest education level: 9th grade  Occupational  History   Occupation: [employer]  Tobacco Use   Smoking status: Never   Smokeless tobacco: Never  Vaping Use   Vaping status: Never Used  Substance and Sexual Activity   Alcohol use: No    Alcohol/week: 0.0 standard drinks of alcohol   Drug use: No   Sexual activity: Not Currently  Other Topics Concern   Not on file  Social History Narrative   Not on file   Social Drivers of Health   Financial Resource Strain: Not on file  Food Insecurity: No Food Insecurity (09/16/2023)   Hunger Vital Sign    Worried About Running Out of Food in the Last Year: Never true    Ran Out of Food in the Last Year: Never true  Transportation Needs: No Transportation Needs (09/16/2023)   PRAPARE - Administrator, Civil Service (Medical): No    Lack of Transportation (Non-Medical): No  Physical Activity: Not on file  Stress: Not on file  Social Connections: Not on file  Intimate Partner Violence: Not At Risk (09/16/2023)   Humiliation, Afraid, Rape, and Kick questionnaire    Fear of Current or Ex-Partner: No    Emotionally Abused: No    Physically Abused: No    Sexually Abused: No    FAMILY HISTORY: Family History  Problem Relation Age of Onset   Diabetes Mother    Breast cancer Mother    Diabetes Father    Uterine cancer Sister    Breast cancer Sister    Breast cancer Sister    Breast cancer Sister    Colon cancer Neg Hx    Pancreatic cancer Neg Hx    Esophageal cancer Neg Hx    Stomach cancer Neg Hx    Liver disease Neg Hx     ALLERGIES:  has no known allergies.  MEDICATIONS:  Current Facility-Administered Medications  Medication Dose Route Frequency Provider Last Rate Last Admin   dexamethasone  (DECADRON ) 40 mg in sodium chloride  0.9 % 50 mL IVPB  40 mg Intravenous Daily Sim Emery CROME, MD   Stopped at 09/16/23 9073   Immune Globulin  10% (PRIVIGEN ) IV infusion 70 g  1 g/kg (Adjusted) Intravenous Q24 Hr x 2 Garrick Charleston, MD   Stopped at 09/16/23 0023    lactated ringers  infusion   Intravenous Continuous Sim Emery CROME, MD 40 mL/hr at 09/16/23 1018 Infusion Verify at 09/16/23 1018   ondansetron  (ZOFRAN ) tablet 4 mg  4 mg Oral Q6H PRN Sim Emery CROME, MD       Or   ondansetron  (ZOFRAN ) injection 4 mg  4 mg Intravenous Q6H PRN Sim Emery CROME, MD       riTUXimab  (RITUXAN ) 800 mg in sodium chloride  0.9 % 250 mL (2.4242 mg/mL) infusion  375 mg/m2 Intravenous Once Tina Alice BROCKS, MD        REVIEW OF SYSTEMS:   Constitutional: Denies fevers, or recent illness Eyes: Denies visual change Ears, nose, mouth, throat, and face: Denies sore throat or infection Respiratory: Denies cough, shortness of breath  Cardiovascular: Denies chest discomfort or chest pain Gastrointestinal:  Denies nausea, vomiting, diarrhea, constipation, heartburn or abdominal pain GU: Denies any dysuria, frequency, hematuria Skin: Petechiae Lymphatics: Denies new lymphadenopathy or mass   PHYSICAL EXAMINATION:  Vitals:   09/16/23 9077 09/16/23 1317  BP: (!) 114/59 126/68  Pulse: 71 76  Resp: 20 20  Temp: 97.9 F (36.6 C) 97.6 F (36.4 C)  SpO2: 95% 96%   Filed Weights   09/15/23 1520 09/16/23 0203  Weight: 246 lb 14.6 oz (112 kg) 250 lb (113.4 kg)    GENERAL:alert, no distress and comfortable. obese SKIN: skin color normal. No jaundice.  Few petechiae in the lower extremity EYES: normal, sclera clear OROPHARYNX: no exudate, moist NECK: supple. No mass LYMPH:  no palpable cervical lymphadenopathy LUNGS: clear to auscultation and normal breathing effort.  No wheeze or rales HEART: regular rate & rhythm and no murmurs ABDOMEN: abdomen soft, non-tender and normal bowel sounds Musculoskeletal:  no lower extremity edema NEURO: alert & with fluent speech; no focal motor/sensory deficits Strength and sensation equal bilaterally  LABORATORY DATA:  I have reviewed the data as listed Lab Results  Component Value Date   WBC 15.6 (H) 09/16/2023   HGB  11.9 (L) 09/16/2023   HCT 36.9 09/16/2023   MCV 90.0 09/16/2023   PLT 6 (LL) 09/16/2023   Recent Labs    09/24/22 1429 09/15/23 1537 09/16/23 0632  NA 134* 137 132*  K 4.0 3.8 3.8  CL 104 106 103  CO2 23 23 22   GLUCOSE 134* 114* 187*  BUN 13 9 12   CREATININE 0.60 0.52 0.53  CALCIUM 8.7* 9.1 9.0  GFRNONAA >60 >60 >60  PROT 7.0 6.8 8.6*  ALBUMIN 3.5 3.4* 3.3*  AST 21 20 23   ALT 26 20 22   ALKPHOS 86 45 52  BILITOT 0.3 0.5 0.8    RADIOGRAPHIC STUDIES: I have personally reviewed the radiological images from her chart relevant to her presentation.

## 2023-09-16 NOTE — Progress Notes (Signed)
 PROGRESS NOTE    [name]  FMW:[id number] DOB: 02-Jan-1976 DOA: 09/15/2023 PCP: Alice Callander, MD   Brief Narrative:  [name] is a 48 y.o. female with medical history significant of chronic ITP, history of DVT and PE, currently not on anticoagulation depression, lumbar radiculopathy, history of previous hemorrhages from ITP including intracerebral and retinal hemorrhages who presented to the ER with generalized rashes all over her body and inside her mouth for about a week.   Assessment & Plan:   Principal Problem:   Acute ITP (HCC) Active Problems:   Chronic hepatitis B (HCC)   GERD (gastroesophageal reflux disease)   Severe thrombocytopenia Acute on chronic ITP Continue steroids and IVIG per oncology -Rituximab  pending - Transfuse platelets if actively bleeding  -Splenectomy 2017   Chronic hepatitis B: Stable.  Continue to monitor   GERD: Not on PPIs.  May restart.  DVT prophylaxis: SCDs Start: 09/16/23 0224   Code Status:   Code Status: Full Code  Family Communication: At bedside  Status is: Inpt  Dispo: The patient is from: Home              Anticipated d/c is to: Home              Anticipated d/c date is: 48-72h              Patient currently not medically stable for discharge  Consultants:  Oncology  Procedures:  None  Antimicrobials:  None   Subjective: No acute  issues/events overnight  Objective: Vitals:   09/15/23 2345 09/16/23 0203 09/16/23 0204 09/16/23 0614  BP: 113/73  139/72 111/65  Pulse: 81  79 66  Resp:   14 14  Temp:   98.2 F (36.8 C) 98.2 F (36.8 C)  TempSrc:   Oral Oral  SpO2: 100%  96% 97%  Weight:  113.4 kg    Height:  5' 1 (1.549 m)      Intake/Output Summary (Last 24 hours) at 09/16/2023 0745 Last data filed at 09/16/2023 0531 Gross per 24 hour  Intake 356.64 ml  Output --  Net 356.64 ml   Filed Weights   09/15/23 1520 09/16/23 0203  Weight: 112 kg 113.4 kg     Examination:  General exam: Appears calm and comfortable  Respiratory system: Clear to auscultation. Respiratory effort normal. Cardiovascular system: S1 & S2 heard, RRR. No JVD, murmurs, rubs, gallops or clicks. No pedal edema. Gastrointestinal system: Abdomen is nondistended, soft and nontender. No organomegaly or masses felt. Normal bowel sounds heard. Central nervous system: Alert and oriented. No focal neurological deficits. Extremities: Symmetric 5 x 5 power. Skin: No rashes, lesions or ulcers Psychiatry: Judgement and insight appear normal. Mood & affect appropriate.     Data Reviewed: I have personally reviewed following labs and imaging studies  CBC: Recent Labs  Lab 09/15/23 1537 09/16/23 0632  WBC 10.5 15.6*  NEUTROABS 6.4  --   HGB 12.0 11.9*  HCT 36.7 36.9  MCV 87.4 90.0  PLT <5* 6*   Basic Metabolic Panel: Recent Labs  Lab 09/15/23 1537 09/16/23 0632  NA 137 132*  K 3.8 3.8  CL 106 103  CO2 23 22  GLUCOSE 114* 187*  BUN 9 12  CREATININE 0.52 0.53  CALCIUM 9.1 9.0   GFR: Estimated Creatinine Clearance: 100.5 mL/min (by C-G formula based on SCr of 0.53 mg/dL). Liver Function Tests: Recent Labs  Lab 09/15/23 1537 09/16/23 0632  AST 20 23  ALT 20 22  ALKPHOS  45 52  BILITOT 0.5 0.8  PROT 6.8 8.6*  ALBUMIN 3.4* 3.3*   No results for input(s): LIPASE, AMYLASE in the last 168 hours. No results for input(s): AMMONIA in the last 168 hours. Coagulation Profile: Recent Labs  Lab 09/15/23 1537  INR 1.1   Cardiac Enzymes: No results for input(s): CKTOTAL, CKMB, CKMBINDEX, TROPONINI in the last 168 hours. BNP (last 3 results) No results for input(s): PROBNP in the last 8760 hours. HbA1C: No results for input(s): HGBA1C in the last 72 hours. CBG: No results for input(s): GLUCAP in the last 168 hours. Lipid Profile: No results for input(s): CHOL, HDL, LDLCALC, TRIG, CHOLHDL, LDLDIRECT in the last 72  hours. Thyroid Function Tests: No results for input(s): TSH, T4TOTAL, FREET4, T3FREE, THYROIDAB in the last 72 hours. Anemia Panel: No results for input(s): VITAMINB12, FOLATE, FERRITIN, TIBC, IRON, RETICCTPCT in the last 72 hours. Sepsis Labs: No results for input(s): PROCALCITON, LATICACIDVEN in the last 168 hours.  No results found for this or any previous visit (from the past 240 hours).       Radiology Studies: No results found.      Scheduled Meds: Continuous Infusions:  dexamethasone  (DECADRON ) IVPB ([facility])     Immune Globulin  10% Stopped (09/16/23 0023)   lactated ringers  40 mL/hr at 09/16/23 0239     LOS: 1 day    Time spent:    Alice JAYSON [place], DO [facility]  If 7PM-7AM, please contact night-coverage [email or web address]  09/16/2023, 7:45 AM

## 2023-09-16 NOTE — Assessment & Plan Note (Addendum)
 Hold on anticoagulation until platelets greater than 50.

## 2023-09-16 NOTE — ED Notes (Signed)
 [facility] here to take PT to [facility].

## 2023-09-16 NOTE — ED Notes (Signed)
 [facility] called no eta given

## 2023-09-16 NOTE — Plan of Care (Signed)

## 2023-09-16 NOTE — ED Notes (Signed)
 Called report to 6E at [facility], Amy RN received report.

## 2023-09-16 NOTE — Assessment & Plan Note (Signed)
 Recurrent with history of refractory ITP Day 2 of IVIG with dexamethasone .  No response. Will start rituximab  weekly first dose today.  Communicated with pharmacy

## 2023-09-17 ENCOUNTER — Encounter: Payer: Self-pay | Admitting: Hematology

## 2023-09-17 ENCOUNTER — Other Ambulatory Visit (HOSPITAL_COMMUNITY): Payer: Self-pay

## 2023-09-17 LAB — BASIC METABOLIC PANEL WITH GFR
Anion gap: 6 (ref 5–15)
BUN: 15 mg/dL (ref 6–20)
CO2: 19 mmol/L — ABNORMAL LOW (ref 22–32)
Calcium: 8.6 mg/dL — ABNORMAL LOW (ref 8.9–10.3)
Chloride: 109 mmol/L (ref 98–111)
Creatinine, Ser: 0.57 mg/dL (ref 0.44–1.00)
GFR, Estimated: >60 mL/min (ref >60)
Glucose, Bld: 219 mg/dL — ABNORMAL HIGH (ref 70–99)
Potassium: 3.9 mmol/L (ref 3.5–5.1)
Sodium: 134 mmol/L — ABNORMAL LOW (ref 135–145)

## 2023-09-17 LAB — CBC
HCT: 34.5 % — ABNORMAL LOW (ref 36.0–46.0)
Hemoglobin: 10.8 g/dL — ABNORMAL LOW (ref 12.0–15.0)
MCH: 28.9 pg (ref 26.0–34.0)
MCHC: 31.3 g/dL (ref 30.0–36.0)
MCV: 92.2 fL (ref 80.0–100.0)
Platelets: 16 10*3/uL — CL (ref 150–400)
RBC: 3.74 MIL/uL — ABNORMAL LOW (ref 3.87–5.11)
RDW: 14.9 % (ref 11.5–15.5)
WBC: 27.3 10*3/uL — ABNORMAL HIGH (ref 4.0–10.5)
nRBC: 0.1 % (ref 0.0–0.2)

## 2023-09-17 MED ORDER — FAMOTIDINE IN NACL 20-0.9 MG/50ML-% IV SOLN
20.0000 mg | Freq: Once | INTRAVENOUS | Status: AC
  Administered 2023-09-17: 20 mg via INTRAVENOUS
  Filled 2023-09-17: qty 50

## 2023-09-17 MED FILL — Immune Globulin (Human) IV Soln 40 GM/400ML: INTRAVENOUS | Qty: 400 | Status: AC

## 2023-09-17 MED FILL — Immune Globulin (Human) IV Soln 10 GM/100ML: INTRAVENOUS | Qty: 100 | Status: AC

## 2023-09-17 MED FILL — Immune Globulin (Human) IV Soln 20 GM/200ML: INTRAVENOUS | Qty: 200 | Status: AC

## 2023-09-17 NOTE — Progress Notes (Addendum)
 [name]   DOB:09-04-1975   FM#:[id number]      ASSESSMENT & PLAN:  [name] is a 48 year old female patient with hematologic diagnosis significant for acute ITP.  Admitted on 09/15/23 with complaints of rash and upon checking her platelets, noted to be <5.  Admitted now for further management.   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 - Status post IVIG daily x 2 doses, completed 6/29 - Rituximab  800 mg ordered 09/16/2023.  Initial plan was for rituximab  weekly. - Noted first dose was not completed due to adverse reaction: Flushing, reddened face with rash.  Rituximab  was stopped.  Nurse on this morning's shift states that the bag of rituximab  was returned to the pharmacy by her and that it was basically full.  -Hematology Dr. Lanny will make further evaluation and treatment recommendations.  History of DVT and PE - Last PE 09/2019 -- Hold anticoagulation (apixaban ) until platelets are greater than 50K -- Patient was on Nplate  in the past and was discontinued when she developed PE and DVT.   Anemia -- Hemoglobin 10.8 -- No transfusional intervention at this time -- Monitor CBC with differential     Code Status Full  Subjective:  Patient seen awake and alert laying in bed. States she feels a little dizzy however happy that headache she had yesterday is better.  No bleeding per patient. Admits to bil LE petechiae.  No shortness of breath.  No acute distress noted.     Objective:   Intake/Output Summary (Last 24 hours) at 09/17/2023 1123 Last data filed at 09/17/2023 1000 Gross per 24 hour  Intake 2966.92 ml  Output --  Net 2966.92 ml     PHYSICAL EXAMINATION: ECOG PERFORMANCE STATUS: 1 - Symptomatic but completely ambulatory  Vitals:   09/17/23 0400 09/17/23 0401  BP: 116/64 116/64  Pulse: 60 60  Resp: 18 20  Temp: 98.4 F (36.9 C) 98.4 F (36.9 C)  SpO2: 98% 98%   Filed Weights    09/15/23 1520 09/16/23 0203  Weight: 246 lb 14.6 oz (112 kg) 250 lb (113.4 kg)    GENERAL: alert, no distress and comfortable SKIN: skin color, texture, turgor are normal, no rashes or significant lesions EYES: normal, conjunctiva are pink and non-injected, sclera clear OROPHARYNX: no exudate, no erythema and lips, buccal mucosa, and tongue normal  NECK: supple, thyroid normal size, non-tender, without nodularity LYMPH: no palpable lymphadenopathy in the cervical, axillary or inguinal LUNGS: clear to auscultation and percussion with normal breathing effort HEART: regular rate & rhythm and no murmurs and no lower extremity edema ABDOMEN: abdomen soft, +obese MUSCULOSKELETAL: no cyanosis of digits and no clubbing  PSYCH: alert & oriented x 3 with fluent speech NEURO: no focal motor/sensory deficits   All questions were answered. The patient knows to call the clinic with any problems, questions or concerns.   The total time spent in the appointment was 40 minutes encounter with patient including review of chart and various tests results, discussions about plan of care and coordination of care plan  Olam JINNY Brunner, NP 09/17/2023 11:23 AM    Labs Reviewed:  Lab Results  Component Value Date   WBC 27.3 (H) 09/17/2023   HGB 10.8 (L) 09/17/2023   HCT 34.5 (L) 09/17/2023   MCV 92.2 09/17/2023   PLT 16 (LL) 09/17/2023   Recent Labs    09/24/22 1429 09/15/23 1537 09/16/23 0632 09/17/23 0708  NA 134* 137 132*  134*  K 4.0 3.8 3.8 3.9  CL 104 106 103 109  CO2 23 23 22  19*  GLUCOSE 134* 114* 187* 219*  BUN 13 9 12 15   CREATININE 0.60 0.52 0.53 0.57  CALCIUM 8.7* 9.1 9.0 8.6*  GFRNONAA >60 >60 >60 >60  PROT 7.0 6.8 8.6*  --   ALBUMIN 3.5 3.4* 3.3*  --   AST 21 20 23   --   ALT 26 20 22   --   ALKPHOS 86 45 52  --   BILITOT 0.3 0.5 0.8  --     Studies Reviewed:  No results found.  Addendum I have seen the patient, examined her. I agree with the assessment and and plan and  have edited the notes.   Patient is well-known to me, I previously treated her refractory and recurrent ITP.  She has not required any treatment for almost 4 years now.  She was admitted for recurrent ITP with petechia and extremely low platelets.  I agree with dexamethasone  and IVIG, and also Rituxan .  But first dose of Rituxan  was given 1:30 AM today, unfortunately she had a reaction after his visit by his infusion, and the medication was returned to pharmacy.  I called the pharmacy and spoke with them, unfortunately her Rituxan  has been discarded.  Will monitor her CBC tomorrow, to see if continued to increase.  Okay to discharge if her platelet count above 20K, and she is clinically stable.  I plan to give second dose Rituxan  in the office. We will f/u.  Onita Mattock MD 09/17/2023

## 2023-09-17 NOTE — Plan of Care (Signed)

## 2023-09-17 NOTE — Progress Notes (Signed)
 PROGRESS NOTE    [name]  FMW:[id number] DOB: Nov 20, 1975 DOA: 09/15/2023 PCP: Lanny Callander, MD  Brief Narrative:  [name] is a 49 y.o. female with medical history significant of chronic ITP, history of DVT and PE, currently not on anticoagulation depression, lumbar radiculopathy, history of previous hemorrhages from ITP including intracerebral and retinal hemorrhages who presented to the ER with generalized rashes all over her body and inside her mouth for about a week.   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  Patient has tolerated rituximab  previously so doubtful this was a true anaphylactic or allergic reaction.  Overnight team did order ranitidine -patient already on high-dose steroids epi and diphenhydramine .  It appears patient received very little rituximab  per nurse. - Transfuse platelets if actively bleeding - Splenectomy 2017   Chronic hepatitis B: Stable. Continue to monitor GERD: Not on PPIs.  May restart.  DVT prophylaxis: SCDs Start: 09/16/23 0224 Code Status:   Code Status: Full Code Family Communication: None present   Status is: Inpt Dispo: The patient is from: Home              Anticipated d/c is to: Home              Anticipated d/c date is: 48-72h              Patient currently not medically stable for discharge  Consultants:  Oncology  Procedures:  None  Antimicrobials:  None   Subjective: Overnight questionable issue of intolerance versus allergy to rituximab  infusion.   Patient otherwise denies nausea vomiting diarrhea constipation headache fevers chills pain shortness of breath swelling or edema.  Objective: Vitals:   09/16/23 1850 09/16/23 2013 09/17/23 0400 09/17/23 0401  BP: 112/65 112/66 116/64 116/64  Pulse: 68 73 60 60  Resp: 17 14 18 20   Temp: 97.7 F (36.5 C) 98.2 F (36.8 C) 98.4 F (36.9 C) 98.4 F (36.9 C)  TempSrc: Oral Oral Oral Oral  SpO2: 97% 95% 98% 98%  Weight:      Height:        Intake/Output Summary (Last 24 hours) at 09/17/2023 0743 Last data filed at 09/17/2023 0534 Gross per 24 hour  Intake 3301.65 ml  Output --  Net 3301.65 ml   Filed Weights   09/15/23 1520 09/16/23 0203  Weight: 112 kg 113.4 kg    Examination:  General exam: Appears calm and comfortable  Respiratory system: Clear to auscultation. Respiratory effort normal. Cardiovascular system: S1 & S2 heard, RRR. No JVD, murmurs, rubs, gallops or clicks. No pedal edema. Gastrointestinal system: Abdomen is nondistended, soft and nontender. No organomegaly or masses felt. Normal bowel sounds heard. Central nervous system: Alert and oriented. No focal neurological deficits. Extremities: Symmetric 5 x 5 power. Skin: No rashes, lesions or ulcers Psychiatry: Judgement and insight appear normal. Mood & affect appropriate.     Data Reviewed: I have personally reviewed following labs and imaging studies  CBC: Recent Labs  Lab 09/15/23 1537 09/16/23 0632  WBC 10.5 15.6*  NEUTROABS 6.4  --   HGB 12.0 11.9*  HCT 36.7 36.9  MCV 87.4 90.0  PLT <5* 6*   Basic Metabolic Panel: Recent Labs  Lab 09/15/23 1537 09/16/23 0632  NA 137 132*  K 3.8 3.8  CL 106 103  CO2 23 22  GLUCOSE 114* 187*  BUN 9 12  CREATININE 0.52 0.53  CALCIUM 9.1 9.0   GFR: Estimated Creatinine Clearance: 100.5 mL/min (by C-G formula based on SCr of 0.53 mg/dL). Liver Function Tests: Recent Labs  Lab 09/15/23 1537 09/16/23 0632  AST 20 23  ALT 20 22  ALKPHOS 45 52  BILITOT  0.5 0.8  PROT 6.8 8.6*  ALBUMIN 3.4* 3.3*   No results for input(s): LIPASE, AMYLASE in the last 168 hours. No results for input(s): AMMONIA in the last 168 hours. Coagulation Profile: Recent Labs  Lab 09/15/23 1537  INR 1.1   Cardiac Enzymes: No results for input(s): CKTOTAL, CKMB, CKMBINDEX, TROPONINI in the last 168 hours. BNP (last 3 results) No results for input(s): PROBNP in the last 8760 hours. HbA1C: No results for input(s): HGBA1C in the last 72 hours. CBG: No results for input(s): GLUCAP in the last 168 hours. Lipid Profile: No results for input(s): CHOL, HDL, LDLCALC, TRIG, CHOLHDL, LDLDIRECT in the last 72 hours. Thyroid Function Tests: No results for input(s): TSH, T4TOTAL, FREET4, T3FREE, THYROIDAB in the last 72 hours. Anemia Panel: No results for input(s): VITAMINB12, FOLATE, FERRITIN, TIBC, IRON, RETICCTPCT in the last 72 hours. Sepsis Labs: No results for input(s): PROCALCITON, LATICACIDVEN in the last 168 hours.  No results found for this or any previous visit (from the past 240 hours).       Radiology Studies: No results found.      Scheduled Meds: Continuous Infusions:  dexamethasone  (DECADRON ) IVPB ([facility]) Stopped (09/16/23 9073)     LOS: 2 days    Time spent:    Elsie JAYSON Montclair, DO [facility]  If 7PM-7AM, please contact night-coverage [email or web address]  09/17/2023, 7:43 AM

## 2023-09-17 NOTE — Progress Notes (Signed)
 Patient started to have reaction last shift to Rituximab  AEB flushing and reddened face with rash , stated she was feeling anxious   Charge and RN messaged provider after starting interventions placed on MAR, and taking VS, provider ordered labs, patient remained alert and oriented. Mildly bradycardiac per usual status, no s/s of diff breathing, remained on RA, sats high 90s, a little less when patient is lying flat but patient is obese, denied pain, in bed resting, reeducated on s/s to report, monitored closely and she fell asleep,family at bedside, in bed resting, call light in reach

## 2023-09-18 ENCOUNTER — Encounter: Payer: Self-pay | Admitting: Hematology

## 2023-09-18 ENCOUNTER — Other Ambulatory Visit (HOSPITAL_COMMUNITY): Payer: Self-pay

## 2023-09-18 DIAGNOSIS — Z86718 Personal history of other venous thrombosis and embolism: Secondary | ICD-10-CM

## 2023-09-18 DIAGNOSIS — Z86711 Personal history of pulmonary embolism: Secondary | ICD-10-CM

## 2023-09-18 DIAGNOSIS — D649 Anemia, unspecified: Secondary | ICD-10-CM

## 2023-09-18 DIAGNOSIS — R21 Rash and other nonspecific skin eruption: Secondary | ICD-10-CM

## 2023-09-18 LAB — CBC
HCT: 36.9 % (ref 36.0–46.0)
HCT: 33.5 % — ABNORMAL LOW (ref 36.0–46.0)
Hemoglobin: 11.9 g/dL — ABNORMAL LOW (ref 12.0–15.0)
Hemoglobin: 10.8 g/dL — ABNORMAL LOW (ref 12.0–15.0)
MCH: 29 pg (ref 26.0–34.0)
MCH: 28.7 pg (ref 26.0–34.0)
MCHC: 32.2 g/dL (ref 30.0–36.0)
MCHC: 32.2 g/dL (ref 30.0–36.0)
MCV: 90 fL (ref 80.0–100.0)
MCV: 89.1 fL (ref 80.0–100.0)
Platelets: 6 10*3/uL — CL (ref 150–400)
Platelets: 15 10*3/uL — CL (ref 150–400)
RBC: 4.1 MIL/uL (ref 3.87–5.11)
RBC: 3.76 MIL/uL — ABNORMAL LOW (ref 3.87–5.11)
RDW: 14.5 % (ref 11.5–15.5)
RDW: 14.9 % (ref 11.5–15.5)
WBC: 15.6 10*3/uL — ABNORMAL HIGH (ref 4.0–10.5)
WBC: 18.5 10*3/uL — ABNORMAL HIGH (ref 4.0–10.5)
nRBC: 0.1 % (ref 0.0–0.2)
nRBC: 0.1 % (ref 0.0–0.2)

## 2023-09-18 MED ORDER — LIDOCAINE-PRILOCAINE 2.5-2.5 % EX CREA
TOPICAL_CREAM | CUTANEOUS | 3 refills | Status: AC
  Filled 2023-09-18: qty 30, fill #0
  Filled 2023-09-18: qty 30, 30d supply, fill #0

## 2023-09-18 MED ORDER — HEPARIN SOD (PORK) LOCK FLUSH 100 UNIT/ML IV SOLN: 500.0000 [IU] | Freq: Once | INTRAVENOUS | Status: DC | PRN

## 2023-09-18 MED ORDER — DIPHENHYDRAMINE HCL 50 MG/ML IJ SOLN: 50.0000 mg | Freq: Once | INTRAMUSCULAR | Status: DC | PRN

## 2023-09-18 MED ORDER — SODIUM CHLORIDE 0.9 % IV SOLN: INTRAVENOUS | Status: DC

## 2023-09-18 MED ORDER — METHYLPREDNISOLONE SODIUM SUCC 125 MG IJ SOLR: 125.0000 mg | Freq: Once | INTRAMUSCULAR | Status: DC | PRN

## 2023-09-18 MED ORDER — HEPARIN SOD (PORK) LOCK FLUSH 100 UNIT/ML IV SOLN: 250.0000 [IU] | Freq: Once | INTRAVENOUS | Status: DC | PRN

## 2023-09-18 MED ORDER — MELATONIN 3 MG PO TABS
3.0000 mg | ORAL_TABLET | Freq: Every day | ORAL | Status: DC
  Administered 2023-09-18: 3 mg via ORAL
  Filled 2023-09-18: qty 1

## 2023-09-18 MED ORDER — SODIUM CHLORIDE 0.9 % IV SOLN
187.0000 mg/m2 | Freq: Once | INTRAVENOUS | Status: AC
  Administered 2023-09-18: 400 mg via INTRAVENOUS
  Filled 2023-09-18: qty 40

## 2023-09-18 MED ORDER — ALBUTEROL SULFATE HFA 108 (90 BASE) MCG/ACT IN AERS: 2.0000 | INHALATION_SPRAY | Freq: Once | RESPIRATORY_TRACT | Status: DC | PRN

## 2023-09-18 MED ORDER — FAMOTIDINE IN NACL 20-0.9 MG/50ML-% IV SOLN: 20.0000 mg | Freq: Once | INTRAVENOUS | Status: DC | PRN

## 2023-09-18 MED ORDER — SODIUM CHLORIDE 0.9% FLUSH: 10.0000 mL | INTRAVENOUS | Status: DC | PRN

## 2023-09-18 MED ORDER — ACETAMINOPHEN 325 MG PO TABS
650.0000 mg | ORAL_TABLET | Freq: Once | ORAL | Status: AC
  Administered 2023-09-18: 650 mg via ORAL
  Filled 2023-09-18: qty 2

## 2023-09-18 MED ORDER — SODIUM CHLORIDE 0.9 % IV SOLN: Freq: Once | INTRAVENOUS | Status: AC

## 2023-09-18 MED ORDER — EPINEPHRINE 0.3 MG/0.3ML IJ SOAJ: 0.3000 mg | Freq: Once | INTRAMUSCULAR | Status: DC | PRN

## 2023-09-18 MED ORDER — DIPHENHYDRAMINE HCL 25 MG PO CAPS
50.0000 mg | ORAL_CAPSULE | Freq: Once | ORAL | Status: AC
  Administered 2023-09-18: 50 mg via ORAL
  Filled 2023-09-18: qty 2

## 2023-09-18 MED ORDER — ALTEPLASE 2 MG IJ SOLR: 2.0000 mg | Freq: Once | INTRAMUSCULAR | Status: DC | PRN

## 2023-09-18 MED ORDER — FAMOTIDINE IN NACL 20-0.9 MG/50ML-% IV SOLN
20.0000 mg | Freq: Once | INTRAVENOUS | Status: AC
  Administered 2023-09-18: 20 mg via INTRAVENOUS
  Filled 2023-09-18: qty 50

## 2023-09-18 MED ORDER — SODIUM CHLORIDE 0.9 % IV SOLN: Freq: Once | INTRAVENOUS | Status: DC | PRN

## 2023-09-18 MED ORDER — SODIUM CHLORIDE 0.9% FLUSH: 3.0000 mL | INTRAVENOUS | Status: DC | PRN

## 2023-09-18 NOTE — Progress Notes (Addendum)
 [name]   DOB:11-28-1975   FM#:[id number]      ASSESSMENT & PLAN:  [name] is a 48 year old female patient with hematologic diagnosis significant for acute ITP.  Admitted on 09/15/23 with complaints of rash and upon checking her platelets, noted to be <5.  Admitted now for further management.    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  Has not required any treatment for almost 4 years.  -- Patient admitted on 09/15/2023 with very low platelet count <5. - Status post IVIG daily x 2 doses, completed 6/29 - Rituximab  800 mg ordered 09/16/2023.  Plan is for rituximab  weekly. - First dose Rituximab  not completed due to adverse reaction of flushing, reddened face with rash.   -- Plan rechallenge today 7/1. -- Will continue to monitor CBC and okay to discharge if platelet count above 20K and she is clinically stable.   -- Will plan second dose Rituximab  in outpatient oncology office.  -- Platelet count remains low 15K today -Hematology Dr. Lanny following closely.    History of DVT and PE - Last PE 09/2019 -- Hold anticoagulation (apixaban ) until platelets are greater than 50K -- Patient was on Nplate  in the past and was discontinued when she developed PE and DVT.    Anemia -- Hemoglobin stable 10.8 -- No transfusional intervention at this time -- Monitor CBC with differential      Code Status Full  Subjective:  Patient seen awake and alert laying in bed, patient's daughter and son at bedside.  Reports that she feels well and has no complaints.  Denies any bleeding.  Aware that rituxan  will be given today.  Objective:   Intake/Output Summary (Last 24 hours) at 09/18/2023 1003 Last data filed at 09/17/2023 1817 Gross per 24 hour  Intake 480 ml  Output --  Net 480 ml     PHYSICAL EXAMINATION: ECOG PERFORMANCE STATUS: 1 - Symptomatic but completely ambulatory  Vitals:   09/17/23 2040  09/18/23 0617  BP: 135/71 117/62  Pulse: (!) 57 (!) 40  Resp: 18 18  Temp: 98.2 F (36.8 C) 97.8 F (36.6 C)  SpO2: 97% 98%   Filed Weights   09/15/23 1520 09/16/23 0203  Weight: 246 lb 14.6 oz (112 kg) 250 lb (113.4 kg)    GENERAL: alert, no distress and comfortable SKIN: skin color, texture, turgor are normal, no rashes or significant lesions EYES: normal, conjunctiva are pink and non-injected, sclera clear OROPHARYNX: no exudate, no erythema and lips, buccal mucosa, and tongue normal  NECK: supple, thyroid normal size, non-tender, without nodularity LYMPH: no palpable lymphadenopathy in the cervical, axillary or inguinal LUNGS: clear to auscultation and percussion with normal breathing effort HEART: regular rate & rhythm and no murmurs and no lower extremity edema ABDOMEN: abdomen soft, non-tender and normal bowel sounds +obese MUSCULOSKELETAL: no cyanosis of digits and no clubbing  PSYCH: alert & oriented x 3 with fluent speech NEURO: no focal motor/sensory deficits   All questions were answered. The patient knows to call the clinic with any problems, questions or concerns.   The total time spent in the appointment was 40 minutes encounter with patient including review of chart and various tests results, discussions about plan of care and coordination of care plan  Olam JINNY Brunner, NP 09/18/2023 10:03 AM    Labs Reviewed:  Lab Results  Component Value Date   WBC 18.5 (H) 09/18/2023   HGB 10.8 (L) 09/18/2023  HCT 33.5 (L) 09/18/2023   MCV 89.1 09/18/2023   PLT 15 (LL) 09/18/2023   Recent Labs    09/24/22 1429 09/15/23 1537 09/16/23 0632 09/17/23 0708  NA 134* 137 132* 134*  K 4.0 3.8 3.8 3.9  CL 104 106 103 109  CO2 23 23 22  19*  GLUCOSE 134* 114* 187* 219*  BUN 13 9 12 15   CREATININE 0.60 0.52 0.53 0.57  CALCIUM 8.7* 9.1 9.0 8.6*  GFRNONAA >60 >60 >60 >60  PROT 7.0 6.8 8.6*  --   ALBUMIN 3.5 3.4* 3.3*  --   AST 21 20 23   --   ALT 26 20 22   --   ALKPHOS  86 45 52  --   BILITOT 0.3 0.5 0.8  --     Studies Reviewed:  No results found.   ADDENDUM I have seen the patient, examined her. I agree with the assessment and and plan and have edited the notes.   Patient clinically stable, no signs of bleeding.  Platelets are 15K this morning.  Will re-challenge Rituxan  today with 50% dose, she tolerating well at low infusion rate and is scheduled to complete around 8:30 today.  She will be DEXAMETHASONE  tomorrow.  It may take some time to see the benefit, if plt counts trends up tomorrow, okay to discharge.  I will follow-up her in office and schedule her rest Rituxan  infusion in my office.  I coordinated to her infusion with inpatient pharmacy and her nurse today.  I can be reached at my cell phone [phone] if infusion nurse has questions.  Onita Mattock MD 09/18/2023

## 2023-09-18 NOTE — Progress Notes (Signed)
 PROGRESS NOTE    [name]  FMW:[id number] DOB: 12-31-1975 DOA: 09/15/2023 PCP: Lanny Callander, MD  Brief Narrative:  [name] is a 48 y.o. female with medical history significant of chronic ITP, history of DVT and PE, currently not on anticoagulation depression, lumbar radiculopathy, history of previous hemorrhages from ITP including intracerebral and retinal hemorrhages who presented to the ER with generalized rashes all over her body and inside her mouth for about a week.   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  Patient has tolerated rituximab  previously so doubtful this was a true anaphylactic or allergic reaction.  Overnight team did order ranitidine -patient already on high-dose steroids epi and diphenhydramine .  It appears patient received very little rituximab  per nurse. -Discussed case with oncology, plan for repeat rituximab  transfusion today, follow-up closely for reactions - Transfuse platelets if actively bleeding - Splenectomy 2017   Chronic hepatitis B: Stable. Continue to monitor GERD: Not on PPIs.  May restart.  DVT prophylaxis: SCDs Start: 09/16/23 0224 Code Status:   Code Status: Full Code Family Communication: None present   Status is: Inpt Dispo: The patient is from: Home              Anticipated d/c is to:  Home              Anticipated d/c date is: 48-72h              Patient currently not medically stable for discharge  Consultants:  Oncology  Procedures:  None  Antimicrobials:  None   Subjective: No acute issues or events overnight, patient reports poor sleep but otherwise denies nausea vomiting diarrhea constipation headache fevers chills or chest pain, no signs or symptoms of bleeding  Objective: Vitals:   09/17/23 0401 09/17/23 1336 09/17/23 2040 09/18/23 0617  BP: 116/64 120/65 135/71 117/62  Pulse: 60 63 (!) 57 (!) 40  Resp: 20 18 18 18   Temp: 98.4 F (36.9 C) 98.2 F (36.8 C) 98.2 F (36.8 C) 97.8 F (36.6 C)  TempSrc: Oral Oral Oral Oral  SpO2: 98% 97% 97% 98%  Weight:      Height:        Intake/Output Summary (Last 24 hours) at 09/18/2023 0800 Last data filed at 09/17/2023 1817 Gross per 24 hour  Intake 600 ml  Output --  Net 600 ml   Filed Weights   09/15/23 1520 09/16/23 0203  Weight: 112 kg 113.4 kg    Examination:  General:  Pleasantly resting in bed, No acute distress. HEENT:  Normocephalic atraumatic.  Sclerae nonicteric, noninjected.  Extraocular movements intact bilaterally. Neck:  Without mass or deformity.  Trachea is midline. Lungs:  Clear to auscultate bilaterally without rhonchi, wheeze, or rales. Heart:  Regular rate and rhythm.  Without murmurs, rubs, or gallops. Abdomen:  Soft, nontender, nondistended.  Without guarding or rebound. Extremities: Without cyanosis, clubbing, edema, or obvious deformity. Skin:  Warm and dry, no  erythema.  Data Reviewed: I have personally reviewed following labs and imaging studies  CBC: Recent Labs  Lab 09/15/23 1537 09/16/23 0632 09/17/23 0708 09/18/23 0602  WBC 10.5 15.6* 27.3* 18.5*  NEUTROABS 6.4  --   --   --   HGB 12.0 11.9* 10.8* 10.8*  HCT 36.7 36.9 34.5* 33.5*  MCV 87.4 90.0 92.2 89.1  PLT <5* 6* 16* 15*   Basic Metabolic Panel: Recent Labs  Lab 09/15/23 1537 09/16/23 0632  09/17/23 0708  NA 137 132* 134*  K 3.8 3.8 3.9  CL 106 103 109  CO2 23 22 19*  GLUCOSE 114* 187* 219*  BUN 9 12 15   CREATININE 0.52 0.53 0.57  CALCIUM 9.1 9.0 8.6*   GFR: Estimated Creatinine Clearance: 100.5 mL/min (by C-G formula based on SCr of 0.57 mg/dL). Liver Function Tests: Recent Labs  Lab 09/15/23 1537 09/16/23 0632  AST 20 23  ALT 20 22  ALKPHOS 45 52  BILITOT 0.5 0.8  PROT 6.8 8.6*  ALBUMIN 3.4* 3.3*   No results for input(s): LIPASE, AMYLASE in the last 168 hours. No results for input(s): AMMONIA in the last 168 hours. Coagulation Profile: Recent Labs  Lab 09/15/23 1537  INR 1.1   Cardiac Enzymes: No results for input(s): CKTOTAL, CKMB, CKMBINDEX, TROPONINI in the last 168 hours. BNP (last 3 results) No results for input(s): PROBNP in the last 8760 hours. HbA1C: No results for input(s): HGBA1C in the last 72 hours. CBG: No results for input(s): GLUCAP in the last 168 hours. Lipid Profile: No results for input(s): CHOL, HDL, LDLCALC, TRIG, CHOLHDL, LDLDIRECT in the last 72 hours. Thyroid Function Tests: No results for input(s): TSH, T4TOTAL, FREET4, T3FREE, THYROIDAB in the last 72 hours. Anemia Panel: No results for input(s): VITAMINB12, FOLATE, FERRITIN, TIBC, IRON, RETICCTPCT in the last 72 hours. Sepsis Labs: No results for input(s): PROCALCITON, LATICACIDVEN in the last 168 hours.  No results found for this or any previous visit (from the past 240 hours).       Radiology Studies: No results found.      Scheduled Meds: Continuous Infusions:  dexamethasone  (DECADRON ) IVPB ([facility]) 40 mg (09/17/23 0952)     LOS: 3 days    Time spent:    Elsie JAYSON Montclair, DO [facility]  If 7PM-7AM, please contact night-coverage [email or web address]  09/18/2023, 8:00 AM

## 2023-09-19 ENCOUNTER — Encounter: Payer: Self-pay | Admitting: Hematology

## 2023-09-19 ENCOUNTER — Other Ambulatory Visit: Payer: Self-pay

## 2023-09-19 ENCOUNTER — Other Ambulatory Visit (HOSPITAL_COMMUNITY): Payer: Self-pay

## 2023-09-19 LAB — CBC
HCT: 35.4 % — ABNORMAL LOW (ref 36.0–46.0)
Hemoglobin: 11.8 g/dL — ABNORMAL LOW (ref 12.0–15.0)
MCH: 29.1 pg (ref 26.0–34.0)
MCHC: 33.3 g/dL (ref 30.0–36.0)
MCV: 87.2 fL (ref 80.0–100.0)
Platelets: 22 10*3/uL — CL (ref 150–400)
RBC: 4.06 MIL/uL (ref 3.87–5.11)
RDW: 14.6 % (ref 11.5–15.5)
WBC: 14.1 10*3/uL — ABNORMAL HIGH (ref 4.0–10.5)
nRBC: 0.4 % — ABNORMAL HIGH (ref 0.0–0.2)

## 2023-09-19 NOTE — Discharge Summary (Signed)
 Physician Discharge Summary  [name] FMW:[id number] DOB: 19-Jun-1975 DOA: 09/15/2023  PCP: Lanny Callander, MD  Admit date: 09/15/2023  Discharge date: 09/19/2023  Admitted From: Home.  Disposition:  Home.  Recommendations for Outpatient Follow-up:  Follow up with PCP in 1-2 weeks. Please obtain BMP/CBC in one week. Advised to follow-up with heme oncologist in 1 week. Advised to take rituximab  Next week.  Home Health: None Equipment/Devices: None  Discharge Condition: Stable CODE STATUS: Full code Diet recommendation: Heart Healthy   Brief [facility] Course: This 48 y.o. female with medical history significant of chronic ITP, history of DVT and PE, currently not on anticoagulation,  depression, lumbar radiculopathy, history of previous hemorrhages from ITP including intracerebral and retinal hemorrhages who presented to the ER with generalized rashes all over her body and inside her mouth for about a week. Her platelet count was less than 5K on admission.Oncology was consulted.  Patient was started on steroids and IVIG.  Platelets has improved to 15K.  Patient has also received rituximab  administration.  Subsequently patient's platelet count has improved. Patient has mild allergic reaction which significantly improved.  Patient had a splenectomy done in 2017.  Patient feels much improved.  Platelet count improved to 22K.  Oncologist signed off,  recommended patient can be discharged and patient will get his rituximab  infusion next week.  Patient is being discharged home.   Discharge Diagnoses:  Principal Problem:   Acute ITP (HCC) Active Problems:   Chronic hepatitis B (HCC)   GERD (gastroesophageal reflux disease)   History of pulmonary embolism  Severe thrombocytopenia Acute on chronic ITP Continue steroids and IVIG per oncology. Platelets stable, increased after initial partial rituximab  administration, hoping remainder of infusion today will improve her  platelets, patient okay to DC per oncology if plt>20 - Rituximab  initiated overnight 6/30, apparently at 0130 patient experienced flushing, there was some discussion about whether this was a true allergic reaction versus simple flushing(which is not uncommon with this medication).  Patient has tolerated rituximab  previously so doubtful this was a true anaphylactic or allergic reaction.  Overnight team did order ranitidine -patient already on high-dose steroids epi and diphenhydramine .  It appears patient received very little rituximab  per nurse. -Discussed case with oncology, plan for repeat rituximab  transfusion today, follow-up closely for reactions - Transfuse platelets if actively bleeding. -Platelet count has improved.  Patient has completed rituximab  infusion. - Splenectomy 2017   Chronic hepatitis B: Stable. Continue to monitor GERD: Not on PPIs.  May restart.  Discharge Instructions  Discharge Instructions     Call MD for:  persistant dizziness or light-headedness   Complete by: As directed    Call MD for:  persistant nausea and vomiting   Complete by: As directed    Call MD for:  redness, tenderness, or signs of infection (pain, swelling, redness, odor or green/yellow discharge around incision site)   Complete by: As directed    Diet - low sodium heart healthy   Complete by: As directed    Diet general   Complete by: As directed    Discharge instructions   Complete by: As directed    Advised to follow-up with primary care physician in 1 week. Advised to follow-up with heme oncology as scheduled. Advised to take rituximab  infusion next week.   Increase activity slowly   Complete by: As directed    NURSING COMMUNICATION 2   Complete by: As directed    Hypersensitivity GRADE 1: Transient flushing or rash, or drug fever <  100.4 F. Hold infusion and contact MD. Hypersensitivity  GRADE 2: Rash, flushing, urticaria, dyspnea, or drug fever = or > 100.4. Hold infusion and contact  MD. Give medications as ordered. Hypersensitivity  GRADE 3: symptomatic bronchospasm, with or without urticaria, parenteral medication management indicated, allergy-related edema/angioedema, or hypotension. Hold infusion and contact MD. Give medications as ordered. Hypersensitivity  GRADE 4: Anaphylaxis. Hold infusion and contact MD. Give medications as ordered.   PHYSICIAN COMMUNICATION ORDER   Complete by: As directed    Hepatitis B Virus screening with HBsAg and anti-HBc recommended prior to treatment with rituximab , ofatumumab, or obinutuzumab.   TREATMENT CONDITIONS   Complete by: As directed    No labs are required prior to chemotherapy administration.  NOTIFY MD if patient has unstable vital signs: Temperature >= 100.47F, SBP > 180 or < 90, RR > 30 or HR > 100.   Clinic Appointment Request   Complete by: Sep 26, 2023    Contact your oncology clinic or infusion center to schedule this appointment.   Infusion Appointment Request   Complete by: Sep 26, 2023    Contact your oncology clinic or infusion center to schedule this appointment.      Allergies as of 09/19/2023   No Known Allergies      Medication List     STOP taking these medications    apixaban  5 MG Tabs tablet Commonly known as: ELIQUIS        TAKE these medications    acetaminophen  500 MG tablet Commonly known as: TYLENOL  Take 1,000 mg by mouth every 6 (six) hours as needed for mild pain (pain score 1-3) or headache.   lidocaine -prilocaine cream Commonly known as: EMLA Apply to affected area once        Follow-up Information     Lanny Callander, MD Follow up in 1 week(s).   Specialties: Hematology, Oncology Contact information: [address] [place] [address] [phone]                No Known Allergies  Consultations: Hemetology   Procedures/Studies: No results found.    Subjective: Patient was seen and examined at bedside.  Overnight events noted. Patient reports  doing much better and wants to be discharged.  Platelet count has improved to 22K.  Discharge Exam: Vitals:   09/18/23 1728 09/19/23 0514  BP: 129/61 (!) 129/59  Pulse: (!) 56 (!) 51  Resp:  18  Temp: 98.2 F (36.8 C) 98.1 F (36.7 C)  SpO2: 95% 96%   Vitals:   09/18/23 1343 09/18/23 1648 09/18/23 1728 09/19/23 0514  BP: (!) 143/76 112/66 129/61 (!) 129/59  Pulse: (!) 50 60 (!) 56 (!) 51  Resp: 18   18  Temp: 98.7 F (37.1 C) 98.4 F (36.9 C) 98.2 F (36.8 C) 98.1 F (36.7 C)  TempSrc: Oral Oral Oral Oral  SpO2: 98% 97% 95% 96%  Weight:      Height:        General: Pt is alert, awake, not in acute distress Cardiovascular: RRR, S1/S2 +, no rubs, no gallops Respiratory: CTA bilaterally, no wheezing, no rhonchi Abdominal: Soft, NT, ND, bowel sounds + Extremities: no edema, no cyanosis    The results of significant diagnostics from this hospitalization (including imaging, microbiology, ancillary and laboratory) are listed below for reference.     Microbiology: No results found for this or any previous visit (from the past 240 hours).   Labs: BNP (last 3 results) No results for input(s): BNP  in the last 8760 hours. Basic Metabolic Panel: Recent Labs  Lab 09/15/23 1537 09/16/23 0632 09/17/23 0708  NA 137 132* 134*  K 3.8 3.8 3.9  CL 106 103 109  CO2 23 22 19*  GLUCOSE 114* 187* 219*  BUN 9 12 15   CREATININE 0.52 0.53 0.57  CALCIUM 9.1 9.0 8.6*   Liver Function Tests: Recent Labs  Lab 09/15/23 1537 09/16/23 0632  AST 20 23  ALT 20 22  ALKPHOS 45 52  BILITOT 0.5 0.8  PROT 6.8 8.6*  ALBUMIN 3.4* 3.3*   No results for input(s): LIPASE, AMYLASE in the last 168 hours. No results for input(s): AMMONIA in the last 168 hours. CBC: Recent Labs  Lab 09/15/23 1537 09/16/23 0632 09/17/23 0708 09/18/23 0602 09/19/23 0609  WBC 10.5 15.6* 27.3* 18.5* 14.1*  NEUTROABS 6.4  --   --   --   --   HGB 12.0 11.9* 10.8* 10.8* 11.8*  HCT 36.7 36.9 34.5*  33.5* 35.4*  MCV 87.4 90.0 92.2 89.1 87.2  PLT <5* 6* 16* 15* 22*   Cardiac Enzymes: No results for input(s): CKTOTAL, CKMB, CKMBINDEX, TROPONINI in the last 168 hours. BNP: Invalid input(s): POCBNP CBG: No results for input(s): GLUCAP in the last 168 hours. D-Dimer No results for input(s): DDIMER in the last 72 hours. Hgb A1c No results for input(s): HGBA1C in the last 72 hours. Lipid Profile No results for input(s): CHOL, HDL, LDLCALC, TRIG, CHOLHDL, LDLDIRECT in the last 72 hours. Thyroid function studies No results for input(s): TSH, T4TOTAL, T3FREE, THYROIDAB in the last 72 hours.  Invalid input(s): FREET3 Anemia work up No results for input(s): VITAMINB12, FOLATE, FERRITIN, TIBC, IRON, RETICCTPCT in the last 72 hours. Urinalysis    Component Value Date/Time   COLORURINE YELLOW 09/24/2022 1429   APPEARANCEUR CLEAR 09/24/2022 1429   LABSPEC 1.016 09/24/2022 1429   PHURINE 6.0 09/24/2022 1429   GLUCOSEU NEGATIVE 09/24/2022 1429   HGBUR SMALL (A) 09/24/2022 1429   BILIRUBINUR NEGATIVE 09/24/2022 1429   BILIRUBINUR neg 10/12/2015 0852   KETONESUR NEGATIVE 09/24/2022 1429   PROTEINUR NEGATIVE 09/24/2022 1429   UROBILINOGEN 0.2 10/12/2015 0852   UROBILINOGEN 0.2 10/02/2011 1133   NITRITE NEGATIVE 09/24/2022 1429   LEUKOCYTESUR SMALL (A) 09/24/2022 1429   Sepsis Labs Recent Labs  Lab 09/16/23 0632 09/17/23 0708 09/18/23 0602 09/19/23 0609  WBC 15.6* 27.3* 18.5* 14.1*   Microbiology No results found for this or any previous visit (from the past 240 hours).   Time coordinating discharge: Over 30 minutes  SIGNED:   Darcel Dawley, MD  [facility] 09/19/2023, 2:05 PM Pager   If 7PM-7AM, please contact night-coverage

## 2023-09-19 NOTE — Discharge Instructions (Signed)
 Follow with primary care physician in 1 week. Advised to follow-up with heme oncologist in 1 week. Advised to take rituximab  week.

## 2023-09-19 NOTE — Progress Notes (Signed)
 Pt is uninsured per PA team. Rituxan  has been substituted for Ruxience  so that pt can receive replacement drug through the pt assistance program per pt assistance team.  Linnell Swords, PharmD, MBA

## 2023-09-20 ENCOUNTER — Telehealth: Payer: Self-pay | Admitting: Hematology

## 2023-09-20 ENCOUNTER — Encounter: Payer: Self-pay | Admitting: Hematology

## 2023-09-20 NOTE — Telephone Encounter (Signed)
 Scheduled appointments per WQ. Talked with the patient daughter and she is aware of the made appointments for the patient.

## 2023-09-23 ENCOUNTER — Other Ambulatory Visit: Payer: Self-pay | Admitting: Hematology

## 2023-09-23 DIAGNOSIS — D693 Immune thrombocytopenic purpura: Secondary | ICD-10-CM

## 2023-09-24 ENCOUNTER — Encounter: Payer: Self-pay | Admitting: Hematology

## 2023-09-24 NOTE — Progress Notes (Signed)
 Solu-medrol  125mg  added as premed per Dr. Lanny as, First dose Rituximab  not completed due to adverse reaction of flushing, reddened face with rash.  Raigen Jagielski, PharmD, MBA

## 2023-09-25 ENCOUNTER — Inpatient Hospital Stay (HOSPITAL_BASED_OUTPATIENT_CLINIC_OR_DEPARTMENT_OTHER): Payer: Self-pay | Admitting: Hematology

## 2023-09-25 ENCOUNTER — Inpatient Hospital Stay: Payer: Self-pay | Attending: Hematology

## 2023-09-25 ENCOUNTER — Encounter: Payer: Self-pay | Admitting: Hematology

## 2023-09-25 ENCOUNTER — Other Ambulatory Visit (HOSPITAL_COMMUNITY): Payer: Self-pay

## 2023-09-25 ENCOUNTER — Other Ambulatory Visit: Payer: Self-pay

## 2023-09-25 ENCOUNTER — Inpatient Hospital Stay: Payer: Self-pay

## 2023-09-25 VITALS — BP 123/71 | HR 83 | Temp 98.0°F | Resp 16

## 2023-09-25 VITALS — BP 105/50 | HR 76 | Temp 98.0°F | Resp 17

## 2023-09-25 DIAGNOSIS — Z86718 Personal history of other venous thrombosis and embolism: Secondary | ICD-10-CM | POA: Insufficient documentation

## 2023-09-25 DIAGNOSIS — I82492 Acute embolism and thrombosis of other specified deep vein of left lower extremity: Secondary | ICD-10-CM

## 2023-09-25 DIAGNOSIS — D693 Immune thrombocytopenic purpura: Secondary | ICD-10-CM | POA: Insufficient documentation

## 2023-09-25 DIAGNOSIS — B1910 Unspecified viral hepatitis B without hepatic coma: Secondary | ICD-10-CM | POA: Insufficient documentation

## 2023-09-25 DIAGNOSIS — Z5112 Encounter for antineoplastic immunotherapy: Secondary | ICD-10-CM | POA: Insufficient documentation

## 2023-09-25 DIAGNOSIS — H538 Other visual disturbances: Secondary | ICD-10-CM | POA: Insufficient documentation

## 2023-09-25 DIAGNOSIS — Z9081 Acquired absence of spleen: Secondary | ICD-10-CM | POA: Insufficient documentation

## 2023-09-25 DIAGNOSIS — R42 Dizziness and giddiness: Secondary | ICD-10-CM | POA: Insufficient documentation

## 2023-09-25 DIAGNOSIS — R109 Unspecified abdominal pain: Secondary | ICD-10-CM | POA: Insufficient documentation

## 2023-09-25 DIAGNOSIS — Z86711 Personal history of pulmonary embolism: Secondary | ICD-10-CM | POA: Insufficient documentation

## 2023-09-25 DIAGNOSIS — Z7901 Long term (current) use of anticoagulants: Secondary | ICD-10-CM | POA: Insufficient documentation

## 2023-09-25 LAB — CBC WITH DIFFERENTIAL (CANCER CENTER ONLY)
Abs Immature Granulocytes: 0.07 K/uL (ref 0.00–0.07)
Basophils Absolute: 0 K/uL (ref 0.0–0.1)
Basophils Relative: 0 %
Eosinophils Absolute: 0.2 K/uL (ref 0.0–0.5)
Eosinophils Relative: 2 %
HCT: 39.4 % (ref 36.0–46.0)
Hemoglobin: 13.6 g/dL (ref 12.0–15.0)
Immature Granulocytes: 1 %
Lymphocytes Relative: 28 %
Lymphs Abs: 1.9 K/uL (ref 0.7–4.0)
MCH: 29.4 pg (ref 26.0–34.0)
MCHC: 34.5 g/dL (ref 30.0–36.0)
MCV: 85.3 fL (ref 80.0–100.0)
Monocytes Absolute: 0.7 K/uL (ref 0.1–1.0)
Monocytes Relative: 10 %
Neutro Abs: 4.2 K/uL (ref 1.7–7.7)
Neutrophils Relative %: 59 %
Platelet Count: 181 K/uL (ref 150–400)
RBC: 4.62 MIL/uL (ref 3.87–5.11)
RDW: 14.5 % (ref 11.5–15.5)
WBC Count: 7 K/uL (ref 4.0–10.5)
nRBC: 0.4 % — ABNORMAL HIGH (ref 0.0–0.2)

## 2023-09-25 LAB — CMP (CANCER CENTER ONLY)
ALT: 25 U/L (ref 0–44)
AST: 17 U/L (ref 15–41)
Albumin: 3.3 g/dL — ABNORMAL LOW (ref 3.5–5.0)
Alkaline Phosphatase: 80 U/L (ref 38–126)
Anion gap: 8 (ref 5–15)
BUN: 16 mg/dL (ref 6–20)
CO2: 22 mmol/L (ref 22–32)
Calcium: 8.8 mg/dL — ABNORMAL LOW (ref 8.9–10.3)
Chloride: 101 mmol/L (ref 98–111)
Creatinine: 0.48 mg/dL (ref 0.44–1.00)
GFR, Estimated: >60 mL/min (ref >60)
Glucose, Bld: 128 mg/dL — ABNORMAL HIGH (ref 70–99)
Potassium: 3.9 mmol/L (ref 3.5–5.1)
Sodium: 131 mmol/L — ABNORMAL LOW (ref 135–145)
Total Bilirubin: 0.3 mg/dL (ref 0.0–1.2)
Total Protein: 6.9 g/dL (ref 6.5–8.1)

## 2023-09-25 LAB — HEPATITIS B SURFACE ANTIGEN: Hepatitis B Surface Ag: NONREACTIVE

## 2023-09-25 MED ORDER — FAMOTIDINE IN NACL 20-0.9 MG/50ML-% IV SOLN
20.0000 mg | Freq: Once | INTRAVENOUS | Status: AC
  Administered 2023-09-25: 20 mg via INTRAVENOUS
  Filled 2023-09-25: qty 50

## 2023-09-25 MED ORDER — DIPHENHYDRAMINE HCL 25 MG PO CAPS
25.0000 mg | ORAL_CAPSULE | Freq: Once | ORAL | Status: AC
  Administered 2023-09-25: 25 mg via ORAL
  Filled 2023-09-25: qty 1

## 2023-09-25 MED ORDER — ACETAMINOPHEN 325 MG PO TABS
650.0000 mg | ORAL_TABLET | Freq: Once | ORAL | Status: AC
Start: 2023-09-25 — End: 2023-09-25
  Administered 2023-09-25: 650 mg via ORAL
  Filled 2023-09-25: qty 2

## 2023-09-25 MED ORDER — SODIUM CHLORIDE 0.9 % IV SOLN: INTRAVENOUS | Status: DC

## 2023-09-25 MED ORDER — METHYLPREDNISOLONE SODIUM SUCC 125 MG IJ SOLR
125.0000 mg | Freq: Once | INTRAMUSCULAR | Status: AC
  Administered 2023-09-25: 125 mg via INTRAVENOUS
  Filled 2023-09-25: qty 2

## 2023-09-25 MED ORDER — SODIUM CHLORIDE 0.9 % IV SOLN
375.0000 mg/m2 | Freq: Once | INTRAVENOUS | Status: AC
  Administered 2023-09-25: 800 mg via INTRAVENOUS
  Filled 2023-09-25: qty 30

## 2023-09-25 MED ORDER — RIVAROXABAN 20 MG PO TABS
20.0000 mg | ORAL_TABLET | Freq: Every day | ORAL | 5 refills | Status: AC
  Filled 2023-09-25: qty 30, 30d supply, fill #0

## 2023-09-25 NOTE — Patient Instructions (Addendum)
 Instrucciones al darle de alta: Discharge Instructions Gracias por elegir al [facility] de Cncer de [place] para [place] atencin mdica de oncologa y Teacher, English as a foreign language.   Si usted tiene una cita de laboratorio con el [facility]cer, por favor vaya directamente al [employer] de Cncer y regstrese en el rea de Engineer, maintenance (IT).   Use ropa cmoda y [place] para tener fcil acceso a las vas del Portacath (acceso venoso de larga duracin) o la lnea PICC (catter central colocado por va perifrica).   Nos esforzamos por ofrecerle tiempo de calidad con su proveedor. Es posible que tenga que volver a programar su cita si llega tarde (15 minutos o ms).  El llegar tarde le afecta a usted y a otros pacientes cuyas citas son posteriores a Armed forces operational officer.  Adems, si usted falta a tres o ms citas sin avisar a la oficina, puede ser retirado(a) de la clnica a discrecin del proveedor.      Para las solicitudes de renovacin de recetas, pida a su farmacia que se ponga en contacto con nuestra oficina y deje que transcurran 72 horas para que se complete el proceso de las renovaciones.    Hoy usted recibi los siguientes agentes de quimioterapia e/o inmunoterapia: Rituximab  (Rituxan )      Para ayudar a prevenir las nuseas y los vmitos despus de su tratamiento, le recomendamos que tome su medicamento para las nuseas segn las indicaciones.  LOS SNTOMAS QUE DEBEN COMUNICARSE INMEDIATAMENTE SE INDICAN A CONTINUACIN: *FIEBRE SUPERIOR A 100.4 F (38 C) O MS *ESCALOFROS O SUDORACIN *NUSEAS Y VMITOS QUE NO SE CONTROLAN CON EL MEDICAMENTO PARA LAS NUSEAS *DIFICULTAD INUSUAL PARA RESPIRAR  *MORETONES O HEMORRAGIAS NO HABITUALES *PROBLEMAS URINARIOS (dolor o ardor al Geographical information systems officer o frecuencia para Geographical information systems officer) *PROBLEMAS INTESTINALES (diarrea inusual, estreimiento, dolor cerca del ano) SENSIBILIDAD EN LA BOCA Y EN LA GARGANTA CON O SIN LA PRESENCIA DE LCERAS (dolor de garganta, llagas en la boca o dolor de  muelas/dientes) ERUPCIN, HINCHAZN O DOLORES INUSUALES FLUJO VAGINAL INUSUAL O PICAZN/RASQUIA    Los puntos marcados con un asterisco ( *) indican una posible emergencia y debe hacer un seguimiento tan pronto como le sea posible o vaya al Departamento de Emergencias si se le presenta algn problema.  Por favor, muestre la [place] DE ADVERTENCIA DE BABS [place] DE ADVERTENCIA DE LTANYA al registrarse en [address] de Emergencias y a la enfermera de triaje.  Si tiene preguntas despus de su visita o necesita cancelar o volver a programar su cita, por favor pngase en contacto con [facility]  Dept: [phone] y siga las instrucciones. Las horas de oficina son de 8:00 a.m. a 4:30 p.m. de lunes a viernes. Por favor, tenga en cuenta que los mensajes de voz que se dejan despus de las 4:00 p.m. posiblemente no se devolvern hasta el siguiente da de trabajo.  Cerramos los fines de semana y [employer]. En todo momento tiene acceso a una enfermera para preguntas urgentes. Por favor, llame al nmero principal de la clnica Dept: [phone] y siga las instrucciones.   Para cualquier pregunta que no sea de carcter urgente, tambin puede ponerse en contacto con su proveedor utilizando MyChart. Ahora ofrecemos visitas electrnicas para cualquier persona mayor de 18 aos que solicite atencin mdica en lnea para los sntomas que no sean urgentes. Para ms detalles vaya a [email or web address].   Tambin puede bajar la aplicacin de MyChart! Vaya a  la tienda de aplicaciones, busque MyChart, abra la aplicacin, seleccione [place], e ingrese con su nombre de usuario y la contrasea de Clinical cytogeneticist.

## 2023-09-25 NOTE — Assessment & Plan Note (Signed)
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 She responded to dexamethasone , IVIG, and Nplate  quickly in a few days.  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

## 2023-09-25 NOTE — Assessment & Plan Note (Signed)
 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  She was bridged with Lovenox  and discharged 10/10/19 on Coumadin .   -She was scheduled to begin outpatient Rituxan  for refractory ITP on 7/26 but presented to ED with severe thrombocytopenia platelet less than 5K and gum bleeding.   -CTA showed at least submassive bilateral PE. Vascular surgery did not recommend IVC filter due to risk for occluding her left iliac vein stent while off anticoagulation for the procedure, then occluding her IVC filter therefore leaving with no surgical options.   -Treated aggressively for ITP including platelet transfusion, 40 mg Dex daily for 4 days, IVIG, and Nplate .  Anticoagulation was held until platelets greater than 30 then received heparin  without bolus.   -S/p first dose of Rituxan  for refractory ITP on 7/29 in the hospital, plan for weekly x4.   -discharged on coumadin  which was eventually changed to Xarelto  in 2022 -changed Xarelto  to Eliquis  due to oral bleeding

## 2023-09-25 NOTE — Progress Notes (Signed)
 [facility]   Telephone:([phone]) [phone] Fax:([phone]   Clinic Follow up Note   Patient Care Team: Lanny Callander, MD as PCP - General (Hematology) Freddie Lynwood HERO, MD as Consulting Physician (Oncology) Dusty Rush (Hematology and Oncology) Maree Lonni Inks, MD as Consulting Physician (Ophthalmology) Nivia Charletta BIRCH, MD (Internal Medicine)  Date of Service:  09/25/2023  CHIEF COMPLAINT: f/u of recurrent ITP   CURRENT THERAPY:  Rituxan  weekly x 4  Oncology History   DVT (deep venous thrombosis) (HCC) presented to ED 7/20 with left-sided swelling found to have extensive left iliofemoral DVT, 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  She was bridged with Lovenox  and discharged 10/10/19 on Coumadin .   -She was scheduled to begin outpatient Rituxan  for refractory ITP on 7/26 but presented to ED with severe thrombocytopenia platelet less than 5K and gum bleeding.   -CTA showed at least submassive bilateral PE. Vascular surgery did not recommend IVC filter due to risk for occluding her left iliac vein stent while off anticoagulation for the procedure, then occluding her IVC filter therefore leaving with no surgical options.   -Treated aggressively for ITP including platelet transfusion, 40 mg Dex daily for 4 days, IVIG, and Nplate .  Anticoagulation was held until platelets greater than 30 then received heparin  without bolus.   -S/p first dose of Rituxan  for refractory ITP on 7/29 in the hospital, plan for weekly x4.   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 She responded to dexamethasone , IVIG, and Nplate  quickly in a few days.  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 Previously experienced a reaction to first Rituxan  treatment, requiring additional premedication. Continuing treatment today, with two more cycles planned over the next two weeks. Previous treatment lasted several years, and similar outcomes are anticipated. - Administer additional premedication due to previous reaction - Continue Rituxan  treatment with two more cycles over the next two weeks - Schedule next infusion at 9:30 AM - Perform lab work next week  History of DVT Resumption of anticoagulation therapy with Xarelto  is necessary. Last dose was taken approximately one month ago. - Call in prescription for Xarelto  to the pharmacy with 30 tablets and five refills - Instruct to start Xarelto  today or tomorrow, once daily  Vaginal bleeding Experiencing mild vaginal bleeding outside of regular menstrual cycle, which is not due until the 15th or 16th of the month.  Plan - Lab reviewed, thrombocytopenia resolved -Will proceed to second dose Rituxan  today, and continue weekly for 2 more doses - I refilled Xarelto  to [facility] pharmacy.  I was informed about the high co-pay, I told the patient, she will call her primary care physician to get refill where she pays $40 co-pay there. -f/u  in 2 weeks    Discussed the use of AI scribe software for clinical note transcription with the patient, who gave verbal consent to proceed.  History of Present  Illness [name] is a 48 year old female with recurrent ITP who presents with vaginal bleeding.  She is experiencing recurrent idiopathic thrombocytopenic purpura (ITP) with a recent low platelet count of twenty-two during a hospital admission, which has since normalized to 181,000. She is undergoing a treatment regimen with three more cycles of rituximab  planned, aiming for long-term remission.  She has unexpected vaginal bleeding, described as minimal, occurring earlier than her regular menstrual cycle, which is not due until the fifteenth or sixteenth of the month. No additional bleeding has occurred since her hospital discharge, except for the noted vaginal bleeding.  She has a history of taking Xarelto  for blood clots, with the last dose taken approximately a month ago. She does not currently have the medication at home.     All other systems were reviewed with the patient and are negative.  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    SURGICAL HISTORY: Past Surgical History:  Procedure Laterality Date   BREAST SURGERY  biopsy   COLONOSCOPY WITH PROPOFOL  N/A 09/09/2020   Procedure: COLONOSCOPY WITH PROPOFOL ;  Surgeon: Legrand Victory LITTIE DOUGLAS, MD;  Location: [facility];  Service: Gastroenterology;  Laterality: N/A;   LOWER EXTREMITY VENOGRAPHY Left 10/08/2019   Procedure: LOWER EXTREMITY VENOGRAPHY;  Surgeon: Gretta Lonni PARAS, MD;  Location: [facility];  Service: Cardiovascular;  Laterality: Left;   PERIPHERAL VASCULAR INTERVENTION Left 10/08/2019   Procedure: PERIPHERAL VASCULAR INTERVENTION;  Surgeon: Gretta Lonni PARAS, MD;  Location: [facility];  Service: Cardiovascular;  Laterality: Left;  LT COMMON ILIAC VEIN    PERIPHERAL VASCULAR THROMBECTOMY N/A 10/08/2019   Procedure: PERIPHERAL VASCULAR THROMBECTOMY;  Surgeon: Gretta Lonni PARAS, MD;  Location: [facility];  Service: Cardiovascular;  Laterality: N/A;   SPLENECTOMY, TOTAL N/A 09/16/2015   Procedure: OPEN SPLENECTOMY;  Surgeon: Deward Null III, MD;  Location: [facility] OR;  Service: General;  Laterality: N/A;    I have reviewed the social history and family history with the patient and they are unchanged from previous note.  ALLERGIES:  has no known allergies.  MEDICATIONS:  Current Outpatient Medications  Medication Sig Dispense Refill   rivaroxaban  (XARELTO ) 20 MG TABS tablet Take 1 tablet (20 mg total) by mouth daily with supper. 30 tablet 5   acetaminophen  (TYLENOL ) 500 MG tablet Take 1,000 mg by mouth every 6 (six) hours as needed for mild pain (pain score 1-3) or headache.     lidocaine -prilocaine  (EMLA ) cream Apply to affected area once 30 g 3   No current facility-administered medications for this visit.   Facility-Administered Medications Ordered in Other Visits  Medication Dose Route Frequency Provider Last Rate Last Admin   0.9 %  sodium chloride  infusion   Intravenous Continuous Lanny Callander, MD   Stopped at 09/25/23 1014    PHYSICAL EXAMINATION: ECOG PERFORMANCE STATUS: 1 - Symptomatic but completely ambulatory  Vitals:   09/25/23 0800  BP: (!) 105/50  Pulse: 76  Resp: 17  Temp: 98 F (36.7 C)  SpO2: 100%   Wt Readings from  Last 3 Encounters:  09/16/23 250 lb (113.4 kg)  01/24/23 247 lb (112 kg)  12/12/22 246 lb 1 oz (111.6 kg)     GENERAL:alert, no distress and comfortable SKIN: skin color, texture, turgor are normal, no rashes or significant lesions EYES: normal, Conjunctiva are pink and non-injected, sclera clear NECK: supple, thyroid normal size, non-tender, without nodularity LYMPH:  no palpable lymphadenopathy in the cervical, axillary  LUNGS: clear to auscultation and percussion with normal breathing  effort HEART: regular rate & rhythm and no murmurs and no lower extremity edema ABDOMEN:abdomen soft, non-tender and normal bowel sounds Musculoskeletal:no cyanosis of digits and no clubbing  NEURO: alert & oriented x 3 with fluent speech, no focal motor/sensory deficits  Physical Exam    LABORATORY DATA:  I have reviewed the data as listed    Latest Ref Rng & Units 09/25/2023    7:56 AM 09/19/2023    6:09 AM 09/18/2023    6:02 AM  CBC  WBC 4.0 - 10.5 K/uL 7.0  14.1  18.5   Hemoglobin 12.0 - 15.0 g/dL 86.3  88.1  89.1   Hematocrit 36.0 - 46.0 % 39.4  35.4  33.5   Platelets 150 - 400 K/uL 181  22  15         Latest Ref Rng & Units 09/25/2023    7:56 AM 09/17/2023    7:08 AM 09/16/2023    6:32 AM  CMP  Glucose 70 - 99 mg/dL 871  780  812   BUN 6 - 20 mg/dL 16  15  12    Creatinine 0.44 - 1.00 mg/dL 9.51  9.42  9.46   Sodium 135 - 145 mmol/L 131  134  132   Potassium 3.5 - 5.1 mmol/L 3.9  3.9  3.8   Chloride 98 - 111 mmol/L 101  109  103   CO2 22 - 32 mmol/L 22  19  22    Calcium 8.9 - 10.3 mg/dL 8.8  8.6  9.0   Total Protein 6.5 - 8.1 g/dL 6.9   8.6   Total Bilirubin 0.0 - 1.2 mg/dL 0.3   0.8   Alkaline Phos 38 - 126 U/L 80   52   AST 15 - 41 U/L 17   23   ALT 0 - 44 U/L 25   22       RADIOGRAPHIC STUDIES: I have personally reviewed the radiological images as listed and agreed with the findings in the report. No results found.    No orders of the defined types were placed in this encounter.  All questions were answered. The patient knows to call the clinic with any problems, questions or concerns. No barriers to learning was detected. The total time spent in the appointment was 25 minutes, including review of chart and various tests results, discussions about plan of care and coordination of care plan     Onita Mattock, MD 09/25/2023

## 2023-09-26 ENCOUNTER — Other Ambulatory Visit (HOSPITAL_COMMUNITY): Payer: Self-pay

## 2023-09-26 ENCOUNTER — Telehealth: Payer: Self-pay

## 2023-09-26 LAB — HEPATITIS B CORE ANTIBODY, TOTAL: HEP B CORE AB: POSITIVE — AB

## 2023-09-26 NOTE — Telephone Encounter (Signed)
 [name] states that she is doing fine. She is eating, drinking, and urinating well. She knows to call the office at [phone] if  she has any questions or concerns.

## 2023-09-26 NOTE — Telephone Encounter (Signed)
-----   Message from Nurse Cortney P sent at 09/25/2023  3:57 PM EDT ----- Regarding: Dr Lanny - first time Rituximab  Dr Lanny patient - first time Rituximab  (we ran as a first time despite patient had received it prior while admitted, they did not titrate and pt reacted). Patient tolerated today's treatment well, no complaints. Patient primarily speaks [place].

## 2023-09-27 NOTE — Progress Notes (Signed)
 Patient tolerated rapid Rituxan  in 2017. When she received a dose in the hospital on 09/16/23, it was not completed due to adverse reaction of flushing, reddened face with rash. Dr. Lanny added Solu-Medrol  125mg  to premeds on 09/25/23, and it appears that pt tolerated the infusion well, as I don't see any documentation stating otherwise. Per staff message from Dr. Lanny, Rapid infusion is fine with me for next two doses. C3 and C4 orders have been updated to rapid Rituxan . Nursing aware.  Marta Bouie, PharmD, MBA

## 2023-10-01 ENCOUNTER — Other Ambulatory Visit: Payer: Self-pay

## 2023-10-01 DIAGNOSIS — D693 Immune thrombocytopenic purpura: Secondary | ICD-10-CM

## 2023-10-01 DIAGNOSIS — I82402 Acute embolism and thrombosis of unspecified deep veins of left lower extremity: Secondary | ICD-10-CM

## 2023-10-01 DIAGNOSIS — I82492 Acute embolism and thrombosis of other specified deep vein of left lower extremity: Secondary | ICD-10-CM

## 2023-10-02 ENCOUNTER — Inpatient Hospital Stay: Payer: Self-pay

## 2023-10-02 VITALS — BP 113/78 | HR 72 | Temp 98.1°F | Resp 14

## 2023-10-02 DIAGNOSIS — I82402 Acute embolism and thrombosis of unspecified deep veins of left lower extremity: Secondary | ICD-10-CM

## 2023-10-02 DIAGNOSIS — D693 Immune thrombocytopenic purpura: Secondary | ICD-10-CM

## 2023-10-02 DIAGNOSIS — I82492 Acute embolism and thrombosis of other specified deep vein of left lower extremity: Secondary | ICD-10-CM

## 2023-10-02 LAB — CBC WITH DIFFERENTIAL (CANCER CENTER ONLY)
Abs Immature Granulocytes: 0.05 K/uL (ref 0.00–0.07)
Basophils Absolute: 0 K/uL (ref 0.0–0.1)
Basophils Relative: 0 %
Eosinophils Absolute: 0.2 K/uL (ref 0.0–0.5)
Eosinophils Relative: 2 %
HCT: 33.9 % — ABNORMAL LOW (ref 36.0–46.0)
Hemoglobin: 11.4 g/dL — ABNORMAL LOW (ref 12.0–15.0)
Immature Granulocytes: 1 %
Lymphocytes Relative: 19 %
Lymphs Abs: 1.8 K/uL (ref 0.7–4.0)
MCH: 28.9 pg (ref 26.0–34.0)
MCHC: 33.6 g/dL (ref 30.0–36.0)
MCV: 86 fL (ref 80.0–100.0)
Monocytes Absolute: 0.8 K/uL (ref 0.1–1.0)
Monocytes Relative: 8 %
Neutro Abs: 6.5 K/uL (ref 1.7–7.7)
Neutrophils Relative %: 70 %
Platelet Count: 367 K/uL (ref 150–400)
RBC: 3.94 MIL/uL (ref 3.87–5.11)
RDW: 15.3 % (ref 11.5–15.5)
WBC Count: 9.3 K/uL (ref 4.0–10.5)
nRBC: 0.2 % (ref 0.0–0.2)

## 2023-10-02 LAB — CMP (CANCER CENTER ONLY)
ALT: 16 U/L (ref 0–44)
AST: 13 U/L — ABNORMAL LOW (ref 15–41)
Albumin: 3.2 g/dL — ABNORMAL LOW (ref 3.5–5.0)
Alkaline Phosphatase: 67 U/L (ref 38–126)
Anion gap: 6 (ref 5–15)
BUN: 13 mg/dL (ref 6–20)
CO2: 23 mmol/L (ref 22–32)
Calcium: 8.4 mg/dL — ABNORMAL LOW (ref 8.9–10.3)
Chloride: 106 mmol/L (ref 98–111)
Creatinine: 0.49 mg/dL (ref 0.44–1.00)
GFR, Estimated: >60 mL/min (ref >60)
Glucose, Bld: 137 mg/dL — ABNORMAL HIGH (ref 70–99)
Potassium: 3.8 mmol/L (ref 3.5–5.1)
Sodium: 135 mmol/L (ref 135–145)
Total Bilirubin: 0.3 mg/dL (ref 0.0–1.2)
Total Protein: 6.5 g/dL (ref 6.5–8.1)

## 2023-10-02 LAB — HEPATITIS B SURFACE ANTIGEN: Hepatitis B Surface Ag: NONREACTIVE

## 2023-10-02 MED ORDER — METHYLPREDNISOLONE SODIUM SUCC 125 MG IJ SOLR
125.0000 mg | Freq: Once | INTRAMUSCULAR | Status: AC
  Administered 2023-10-02: 125 mg via INTRAVENOUS
  Filled 2023-10-02: qty 2

## 2023-10-02 MED ORDER — SODIUM CHLORIDE 0.9 % IV SOLN
375.0000 mg/m2 | Freq: Once | INTRAVENOUS | Status: AC
  Administered 2023-10-02: 800 mg via INTRAVENOUS
  Filled 2023-10-02: qty 50

## 2023-10-02 MED ORDER — FAMOTIDINE IN NACL 20-0.9 MG/50ML-% IV SOLN
20.0000 mg | Freq: Once | INTRAVENOUS | Status: AC
  Administered 2023-10-02: 20 mg via INTRAVENOUS
  Filled 2023-10-02: qty 50

## 2023-10-02 MED ORDER — ACETAMINOPHEN 325 MG PO TABS
650.0000 mg | ORAL_TABLET | Freq: Once | ORAL | Status: AC
  Administered 2023-10-02: 650 mg via ORAL
  Filled 2023-10-02: qty 2

## 2023-10-02 MED ORDER — DIPHENHYDRAMINE HCL 25 MG PO CAPS
25.0000 mg | ORAL_CAPSULE | Freq: Once | ORAL | Status: AC
  Administered 2023-10-02: 25 mg via ORAL
  Filled 2023-10-02: qty 1

## 2023-10-02 MED ORDER — SODIUM CHLORIDE 0.9 % IV SOLN: INTRAVENOUS | Status: DC

## 2023-10-02 NOTE — Patient Instructions (Signed)
 Instrucciones al darle de alta: Discharge Instructions Gracias por elegir al [facility] [employer] de Cncer de [place] para [place] atencin mdica de oncologa y Teacher, English as a foreign language.   Si usted tiene una cita de laboratorio con el [facility] Cncer, por favor vaya directamente al [employer] de Cncer y regstrese en el rea de Engineer, maintenance (IT).   Use ropa cmoda y [place] para tener fcil acceso a las vas del Portacath (acceso venoso de larga duracin) o la lnea PICC (catter central colocado por va perifrica).   Nos esforzamos por ofrecerle tiempo de calidad con su proveedor. Es posible que tenga que volver a programar su cita si llega tarde (15 minutos o ms).  El llegar tarde le afecta a usted y a otros pacientes cuyas citas son posteriores a Armed forces operational officer.  Adems, si usted falta a tres o ms citas sin avisar a la oficina, puede ser retirado(a) de la clnica a discrecin del proveedor.      Para las solicitudes de renovacin de recetas, pida a su farmacia que se ponga en contacto con nuestra oficina y deje que transcurran 72 horas para que se complete el proceso de las renovaciones.    Hoy usted recibi los siguientes agentes de quimioterapia e/o inmunoterapia: rituximab       Para ayudar a prevenir las nuseas y los vmitos despus de su tratamiento, le recomendamos que tome su medicamento para las nuseas segn las indicaciones.  LOS SNTOMAS QUE DEBEN COMUNICARSE INMEDIATAMENTE SE INDICAN A CONTINUACIN: *FIEBRE SUPERIOR A 100.4 F (38 C) O MS *ESCALOFROS O SUDORACIN *NUSEAS Y VMITOS QUE NO SE CONTROLAN CON EL MEDICAMENTO PARA LAS NUSEAS *DIFICULTAD INUSUAL PARA RESPIRAR  *MORETONES O HEMORRAGIAS NO HABITUALES *PROBLEMAS URINARIOS (dolor o ardor al Geographical information systems officer o frecuencia para Geographical information systems officer) *PROBLEMAS INTESTINALES (diarrea inusual, estreimiento, dolor cerca del ano) SENSIBILIDAD EN LA BOCA Y EN LA GARGANTA CON O SIN LA PRESENCIA DE LCERAS (dolor de garganta, llagas en la boca o dolor de muelas/dientes) ERUPCIN,  HINCHAZN O DOLORES INUSUALES FLUJO VAGINAL INUSUAL O PICAZN/RASQUIA    Los puntos marcados con un asterisco ( *) indican una posible emergencia y debe hacer un seguimiento tan pronto como le sea posible o vaya al Departamento de Emergencias si se le presenta algn problema.  Por favor, muestre la [place] DE ADVERTENCIA DE BABS [place] DE ADVERTENCIA DE LTANYA al registrarse en [address] de Emergencias y a la enfermera de triaje.  Si tiene preguntas despus de su visita o necesita cancelar o volver a programar su cita, por favor pngase en contacto con [facility] OF [facility]  Dept: [phone] y siga las instrucciones. Las horas de oficina son de 8:00 a.m. a 4:30 p.m. de lunes a viernes. Por favor, tenga en cuenta que los mensajes de voz que se dejan despus de las 4:00 p.m. posiblemente no se devolvern hasta el siguiente da de trabajo.  Cerramos los fines de semana y [employer]. En todo momento tiene acceso a una enfermera para preguntas urgentes. Por favor, llame al nmero principal de la clnica Dept: [phone] y siga las instrucciones.   Para cualquier pregunta que no sea de carcter urgente, tambin puede ponerse en contacto con su proveedor utilizando MyChart. Ahora ofrecemos visitas electrnicas para cualquier persona mayor de 18 aos que solicite atencin mdica en lnea para los sntomas que no sean urgentes. Para ms detalles vaya a [email or web address].   Tambin puede bajar la aplicacin de MyChart! Vaya a la  tienda de aplicaciones, busque MyChart, abra la aplicacin, seleccione [place], e ingrese con su nombre de usuario y la contrasea de Clinical cytogeneticist.

## 2023-10-03 LAB — HEPATITIS B CORE ANTIBODY, TOTAL: HEP B CORE AB: POSITIVE — AB

## 2023-10-09 ENCOUNTER — Inpatient Hospital Stay: Payer: Self-pay

## 2023-10-09 ENCOUNTER — Other Ambulatory Visit: Payer: Self-pay

## 2023-10-09 ENCOUNTER — Inpatient Hospital Stay (HOSPITAL_BASED_OUTPATIENT_CLINIC_OR_DEPARTMENT_OTHER): Payer: Self-pay | Admitting: Hematology

## 2023-10-09 ENCOUNTER — Encounter: Payer: Self-pay | Admitting: Gastroenterology

## 2023-10-09 ENCOUNTER — Ambulatory Visit: Payer: Self-pay

## 2023-10-09 VITALS — BP 96/60 | HR 94 | Temp 98.0°F | Resp 17 | Ht 61.0 in | Wt 252.8 lb

## 2023-10-09 DIAGNOSIS — I82492 Acute embolism and thrombosis of other specified deep vein of left lower extremity: Secondary | ICD-10-CM

## 2023-10-09 DIAGNOSIS — D693 Immune thrombocytopenic purpura: Secondary | ICD-10-CM

## 2023-10-09 DIAGNOSIS — B181 Chronic viral hepatitis B without delta-agent: Secondary | ICD-10-CM

## 2023-10-09 LAB — CMP (CANCER CENTER ONLY)
ALT: 17 U/L (ref 0–44)
AST: 13 U/L — ABNORMAL LOW (ref 15–41)
Albumin: 3.6 g/dL (ref 3.5–5.0)
Alkaline Phosphatase: 69 U/L (ref 38–126)
Anion gap: 7 (ref 5–15)
BUN: 15 mg/dL (ref 6–20)
CO2: 24 mmol/L (ref 22–32)
Calcium: 8.7 mg/dL — ABNORMAL LOW (ref 8.9–10.3)
Chloride: 104 mmol/L (ref 98–111)
Creatinine: 0.51 mg/dL (ref 0.44–1.00)
GFR, Estimated: >60 mL/min (ref >60)
Glucose, Bld: 167 mg/dL — ABNORMAL HIGH (ref 70–99)
Potassium: 4 mmol/L (ref 3.5–5.1)
Sodium: 135 mmol/L (ref 135–145)
Total Bilirubin: 0.3 mg/dL (ref 0.0–1.2)
Total Protein: 7.1 g/dL (ref 6.5–8.1)

## 2023-10-09 LAB — CBC WITH DIFFERENTIAL (CANCER CENTER ONLY)
Abs Immature Granulocytes: 0.1 K/uL — ABNORMAL HIGH (ref 0.00–0.07)
Basophils Absolute: 0.1 K/uL (ref 0.0–0.1)
Basophils Relative: 1 %
Eosinophils Absolute: 0.3 K/uL (ref 0.0–0.5)
Eosinophils Relative: 4 %
HCT: 37.7 % (ref 36.0–46.0)
Hemoglobin: 12.5 g/dL (ref 12.0–15.0)
Immature Granulocytes: 1 %
Lymphocytes Relative: 20 %
Lymphs Abs: 1.7 K/uL (ref 0.7–4.0)
MCH: 28.9 pg (ref 26.0–34.0)
MCHC: 33.2 g/dL (ref 30.0–36.0)
MCV: 87.3 fL (ref 80.0–100.0)
Monocytes Absolute: 0.8 K/uL (ref 0.1–1.0)
Monocytes Relative: 9 %
Neutro Abs: 5.5 K/uL (ref 1.7–7.7)
Neutrophils Relative %: 65 %
Platelet Count: 352 K/uL (ref 150–400)
RBC: 4.32 MIL/uL (ref 3.87–5.11)
RDW: 14.9 % (ref 11.5–15.5)
WBC Count: 8.4 K/uL (ref 4.0–10.5)
nRBC: 0 % (ref 0.0–0.2)

## 2023-10-09 MED ORDER — ACETAMINOPHEN 325 MG PO TABS
650.0000 mg | ORAL_TABLET | Freq: Once | ORAL | Status: AC
  Administered 2023-10-09: 650 mg via ORAL
  Filled 2023-10-09: qty 2

## 2023-10-09 MED ORDER — SODIUM CHLORIDE 0.9 % IV SOLN
375.0000 mg/m2 | Freq: Once | INTRAVENOUS | Status: AC
  Administered 2023-10-09: 800 mg via INTRAVENOUS
  Filled 2023-10-09: qty 50

## 2023-10-09 MED ORDER — DIPHENHYDRAMINE HCL 25 MG PO CAPS
25.0000 mg | ORAL_CAPSULE | Freq: Once | ORAL | Status: AC
  Administered 2023-10-09: 25 mg via ORAL
  Filled 2023-10-09: qty 1

## 2023-10-09 MED ORDER — FAMOTIDINE IN NACL 20-0.9 MG/50ML-% IV SOLN
20.0000 mg | Freq: Once | INTRAVENOUS | Status: AC
  Administered 2023-10-09: 20 mg via INTRAVENOUS
  Filled 2023-10-09: qty 50

## 2023-10-09 MED ORDER — SODIUM CHLORIDE 0.9 % IV SOLN
INTRAVENOUS | Status: DC
Start: 2023-10-09 — End: 2023-10-09

## 2023-10-09 MED ORDER — METHYLPREDNISOLONE SODIUM SUCC 125 MG IJ SOLR
125.0000 mg | Freq: Once | INTRAMUSCULAR | Status: AC
  Administered 2023-10-09: 125 mg via INTRAVENOUS
  Filled 2023-10-09: qty 2

## 2023-10-09 NOTE — Addendum Note (Signed)
 Addended by: LANNY CALLANDER on: 10/09/2023 04:03 PM   Modules accepted: Orders

## 2023-10-09 NOTE — Progress Notes (Signed)
 [facility]   [phone] [phone]   Clinic Follow up Note   Patient Care Team: Lanny Callander, MD as PCP - General (Hematology) Freddie Lynwood HERO, MD as Consulting Physician (Oncology) Dusty Rush (Hematology and Oncology) Maree Lonni Inks, MD as Consulting Physician (Ophthalmology) Nivia Charletta BIRCH, MD (Internal Medicine)  Date of Service:  10/09/2023  CHIEF COMPLAINT: f/u of ITP   CURRENT THERAPY:  Rituxan  weekly x 4  Oncology History   DVT (deep venous thrombosis) (HCC) presented to ED 7/20 with left-sided swelling found to have extensive left iliofemoral DVT, 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  She was bridged with Lovenox  and discharged 10/10/19 on Coumadin .   -She was scheduled to begin outpatient Rituxan  for refractory ITP on 7/26 but presented to ED with severe thrombocytopenia platelet less than 5K and gum bleeding.   -CTA showed at least submassive bilateral PE. Vascular surgery did not recommend IVC filter due to risk for occluding her left iliac vein stent while off anticoagulation for the procedure, then occluding her IVC filter therefore leaving with no surgical options.   -Treated aggressively for ITP including platelet transfusion, 40 mg Dex daily for 4 days, IVIG, and Nplate .  Anticoagulation was held until platelets greater than 30 then received heparin  without bolus.   -S/p first dose of Rituxan  for refractory ITP on 7/29 in the hospital, plan for weekly x4.   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 She responded to dexamethasone , IVIG, and Nplate  quickly in a few days.  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 - Administer Rituxan  infusion today with rapid infusion due to good tolerance in previous sessions - Schedule follow-up lab work in six weeks - Schedule follow-up appointment in three months  Hepatitis B infection Core antibody test for Hepatitis B was positive, S antigen negative,  indicating a past infection. There is concern for potential reactivation due to Rituxan  treatment. - Order blood test to check for active Hepatitis B virus load - Consider referral to infectious disease specialist if active virus is detected - Recommend family members, including children, be tested for Hepatitis B  Dizziness and Blurry Vision Dizziness occurs primarily with movement and when getting up, not when sitting or lying down. Blurry vision is intermittent. Blood pressure is slightly low but within normal range. Symptoms are not believed to be related to ITP. - Advise to maintain adequate hydration to avoid dehydration  Abdominal Pain Persistent abdominal pain on the left side for over a year. Previous colonoscopy was performed in 2022. She has not  seen the GI doctor recently and has difficulty scheduling an endoscopy. - Send a message to Dr. Marinda, the GI specialist, to evaluate the need for an endoscopy   Plan - Lab  reviewed, thrombocytopenia has resolved, will proceed last dose Rituxan  today - Repeat lab in 6 weeks, lab and follow-up with me in 3 months - I messaged her GI Dr. Marinda for his abdominal pain, Dr. Marinda will see her in office.    Discussed the use of AI scribe software for clinical note transcription with the patient, who gave verbal consent to proceed.  History of Present Illness [name] is a 48 year old female with ITP who presents for follow-up.  She experiences ongoing dizziness and intermittent blurry vision, primarily when moving or standing up. The dizziness does not occur when sitting or lying down. She denies taking any blood pressure medication.  She is currently undergoing treatment with Rituxan  for ITP.  She has a history of hepatitis B infection, confirmed by a positive core antibody test. She is uncertain about the timing of the infection in relation to her pregnancies, and her children have not been tested for hepatitis B.  She reports persistent left-sided abdominal pain for over a year. A colonoscopy was performed in 2022, but she has not had a recent follow-up. She has been trying to contact the gastroenterology office for further evaluation without success.     All other systems were reviewed with the patient and are negative.  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    SURGICAL HISTORY: Past Surgical History:  Procedure Laterality Date   BREAST SURGERY  biopsy   COLONOSCOPY WITH PROPOFOL  N/A 09/09/2020   Procedure: COLONOSCOPY WITH PROPOFOL ;  Surgeon: Legrand Victory LITTIE DOUGLAS, MD;  Location: [facility];  Service: Gastroenterology;  Laterality: N/A;   LOWER EXTREMITY VENOGRAPHY Left 10/08/2019   Procedure: LOWER  EXTREMITY VENOGRAPHY;  Surgeon: Gretta Lonni PARAS, MD;  Location: [facility] INV[facility] CV LAB;  Service: Cardiovascular;  Laterality: Left;   PERIPHERAL VASCULAR INTERVENTION Left 10/08/2019   Procedure: PERIPHERAL VASCULAR INTERVENTION;  Surgeon: Gretta Lonni PARAS, MD;  Location: [facility] INVASIVE CV LAB;  Service: Cardiovascular;  Laterality: Left;  LT COMMON ILIAC VEIN   PERIPHERAL VASCULAR THROMBECTOMY N/A 10/08/2019   Procedure: PERIPHERAL VASCULAR THROMBECTOMY;  Surgeon: Gretta Lonni PARAS, MD;  Location: [facility] INVASIVE CV LAB;  Service: Cardiovascular;  Laterality: N/A;   SPLENECTOMY, TOTAL N/A 09/16/2015   Procedure: OPEN SPLENECTOMY;  Surgeon: Deward Null III, MD;  Location: [facility] OR;  Service: General;  Laterality: N/A;    I have reviewed the social history and family history with the patient and they are unchanged from previous note.  ALLERGIES:  has no known allergies.  MEDICATIONS:  Current Outpatient Medications  Medication Sig Dispense Refill   acetaminophen  (TYLENOL ) 500 MG tablet Take 1,000 mg by mouth every 6 (six) hours as needed for mild pain (pain score 1-3) or headache.     lidocaine -prilocaine  (EMLA ) cream Apply to affected area once 30 g 3   rivaroxaban  (XARELTO ) 20 MG TABS tablet Take 1 tablet (20 mg total) by mouth daily with supper. 30 tablet 5   No current facility-administered medications for this visit.   Facility-Administered Medications Ordered in Other Visits  Medication Dose Route Frequency Provider Last Rate Last Admin   0.9 %  sodium chloride  infusion   Intravenous Continuous Lanny Callander, MD   Stopped at 10/09/23 1350    PHYSICAL EXAMINATION: ECOG PERFORMANCE STATUS: 1 - Symptomatic but completely ambulatory  Vitals:   10/09/23 0958  BP: 96/60  Pulse: 94  Resp: 17  Temp: 98 F (36.7 C)  SpO2: 98%   Wt Readings from Last 3 Encounters:  10/09/23 252 lb 12.8 oz (114.7 kg)  09/16/23 250 lb (113.4 kg)  01/24/23 247 lb (112 kg)     GENERAL:alert, no distress and  comfortable SKIN: skin color, texture, turgor are normal, no rashes or significant lesions EYES: normal, Conjunctiva are pink and non-injected, sclera clear NECK: supple, thyroid normal size, non-tender, without nodularity LYMPH:  no palpable lymphadenopathy in the cervical, axillary  LUNGS: clear to auscultation and percussion with normal breathing effort HEART: regular rate & rhythm and no murmurs and no lower extremity edema ABDOMEN:abdomen soft, non-tender and normal bowel sounds Musculoskeletal:no cyanosis of digits and no clubbing  NEURO: alert & oriented x 3 with fluent speech, no focal motor/sensory deficits  Physical Exam    LABORATORY DATA:  I have reviewed the data as listed    Latest Ref Rng & Units 10/09/2023    9:12 AM 10/02/2023   10:38 AM 09/25/2023    7:56 AM  CBC  WBC 4.0 - 10.5 K/uL 8.4  9.3  7.0   Hemoglobin 12.0 - 15.0 g/dL 87.4  88.5  86.3   Hematocrit 36.0 - 46.0 % 37.7  33.9  39.4   Platelets 150 - 400 K/uL 352  367  181         Latest Ref Rng & Units 10/09/2023    9:12 AM 10/02/2023   10:38 AM 09/25/2023    7:56 AM  CMP  Glucose 70 - 99 mg/dL 832  862  871   BUN 6 - 20 mg/dL 15  13  16    Creatinine 0.44 - 1.00 mg/dL 9.48  9.50  9.51   Sodium 135 - 145 mmol/L 135  135  131   Potassium 3.5 - 5.1 mmol/L 4.0  3.8  3.9   Chloride 98 - 111 mmol/L 104  106  101   CO2 22 - 32 mmol/L 24  23  22    Calcium 8.9 - 10.3 mg/dL 8.7  8.4  8.8   Total Protein 6.5 - 8.1 g/dL 7.1  6.5  6.9   Total Bilirubin 0.0 - 1.2 mg/dL 0.3  0.3  0.3   Alkaline Phos 38 - 126 U/L 69  67  80   AST 15 - 41 U/L 13  13  17    ALT 0 - 44 U/L 17  16  25        RADIOGRAPHIC STUDIES: I have personally reviewed the radiological images as listed and agreed with the findings in the report. No results found.    Orders Placed This Encounter  Procedures   Hepatitis B DNA, ultraquantitative, PCR    Standing Status:   Future    Expiration Date:   10/08/2024   All questions were answered.  The patient knows to call the clinic with any problems, questions or concerns. No barriers to learning was detected. The total time spent in the appointment was 25 minutes, including review of chart and various tests results, discussions about plan of care and coordination of care plan     Callander Lanny, MD 10/09/2023

## 2023-10-09 NOTE — Patient Instructions (Signed)
 Instrucciones al darle de alta: Discharge Instructions Gracias por elegir al [facility] de Cncer de [place] para [place] atencin mdica de oncologa y Teacher, English as a foreign language.   Si usted tiene una cita de laboratorio con el Cent[facility] de Cncer, por favor vaya directamente al [employer] de Cncer y regstrese en el rea de Engineer, maintenance (IT).   Use ropa cmoda y [place] para tener fcil acceso a las vas del Portacath (acceso venoso de larga duracin) o la lnea PICC (catter central colocado por va perifrica).   Nos esforzamos por ofrecerle tiempo de calidad con su proveedor. Es posible que tenga que volver a programar su cita si llega tarde (15 minutos o ms).  El llegar tarde le afecta a usted y a otros pacientes cuyas citas son posteriores a Armed forces operational officer.  Adems, si usted falta a tres o ms citas sin avisar a la oficina, puede ser retirado(a) de la clnica a discrecin del proveedor.      Para las solicitudes de renovacin de recetas, pida a su farmacia que se ponga en contacto con nuestra oficina y deje que transcurran 72 horas para que se complete el proceso de las renovaciones.    Hoy usted recibi los siguientes agentes de quimioterapia e/o inmunoterapia rituxan        Para ayudar a prevenir las nuseas y los vmitos despus de su tratamiento, le recomendamos que tome su medicamento para las nuseas segn las indicaciones.  LOS SNTOMAS QUE DEBEN COMUNICARSE INMEDIATAMENTE SE INDICAN A CONTINUACIN: *FIEBRE SUPERIOR A 100.4 F (38 C) O MS *ESCALOFROS O SUDORACIN *NUSEAS Y VMITOS QUE NO SE CONTROLAN CON EL MEDICAMENTO PARA LAS NUSEAS *DIFICULTAD INUSUAL PARA RESPIRAR  *MORETONES O HEMORRAGIAS NO HABITUALES *PROBLEMAS URINARIOS (dolor o ardor al Geographical information systems officer o frecuencia para Geographical information systems officer) *PROBLEMAS INTESTINALES (diarrea inusual, estreimiento, dolor cerca del ano) SENSIBILIDAD EN LA BOCA Y EN LA GARGANTA CON O SIN LA PRESENCIA DE LCERAS (dolor de garganta, llagas en la boca o dolor de muelas/dientes) ERUPCIN,  HINCHAZN O DOLORES INUSUALES FLUJO VAGINAL INUSUAL O PICAZN/RASQUIA    Los puntos marcados con un asterisco ( *) indican una posible emergencia y debe hacer un seguimiento tan pronto como le sea posible o vaya al Departamento de Emergencias si se le presenta algn problema.  Por favor, muestre la [place] DE ADVERTENCIA DE BABS [place] DE ADVERTENCIA DE LTANYA al registrarse en [address] de Emergencias y a la enfermera de triaje.  Si tiene preguntas despus de su visita o necesita cancelar o volver a programar su cita, por favor pngase en contacto con [facility] - A DEPT OF [facility]  Dept: [phone] y siga las instrucciones. Las horas de oficina son de 8:00 a.m. a 4:30 p.m. de lunes a viernes. Por favor, tenga en cuenta que los mensajes de voz que se dejan despus de las 4:00 p.m. posiblemente no se devolvern hasta el siguiente da de trabajo.  Cerramos los fines de semana y [employer]. En todo momento tiene acceso a una enfermera para preguntas urgentes. Por favor, llame al nmero principal de la clnica Dept: [phone] y siga las instrucciones.   Para cualquier pregunta que no sea de carcter urgente, tambin puede ponerse en contacto con su proveedor utilizando MyChart. Ahora ofrecemos visitas electrnicas para cualquier persona mayor de 18 aos que solicite atencin mdica en lnea para los sntomas que no sean urgentes. Para ms detalles vaya a [email or web address].   Tambin puede bajar la aplicacin de MyChart! Vaya a  la tienda de aplicaciones, busque MyChart, abra la aplicacin, seleccione [place], e ingrese con su nombre de usuario y la contrasea de Clinical cytogeneticist.

## 2023-10-09 NOTE — Assessment & Plan Note (Signed)
 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  She was bridged with Lovenox  and discharged 10/10/19 on Coumadin .   -She was scheduled to begin outpatient Rituxan  for refractory ITP on 7/26 but presented to ED with severe thrombocytopenia platelet less than 5K and gum bleeding.   -CTA showed at least submassive bilateral PE. Vascular surgery did not recommend IVC filter due to risk for occluding her left iliac vein stent while off anticoagulation for the procedure, then occluding her IVC filter therefore leaving with no surgical options.   -Treated aggressively for ITP including platelet transfusion, 40 mg Dex daily for 4 days, IVIG, and Nplate .  Anticoagulation was held until platelets greater than 30 then received heparin  without bolus.   -S/p first dose of Rituxan  for refractory ITP on 7/29 in the hospital, plan for weekly x4.   -discharged on coumadin  which was eventually changed to Xarelto  in 2022 -changed Xarelto  to Eliquis  due to oral bleeding

## 2023-10-09 NOTE — Assessment & Plan Note (Signed)
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 She responded to dexamethasone , IVIG, and Nplate  quickly in a few days.  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

## 2023-10-11 ENCOUNTER — Telehealth: Payer: Self-pay | Admitting: Hematology

## 2023-10-11 NOTE — Telephone Encounter (Signed)
 Scheduled appointments per 7/22 los. Called and left a VM with appointment details for the patient.

## 2023-10-12 ENCOUNTER — Other Ambulatory Visit: Payer: Self-pay

## 2023-10-12 ENCOUNTER — Telehealth: Payer: Self-pay

## 2023-10-12 NOTE — Telephone Encounter (Signed)
 Received telephone call from the patient requesting to speak with Dr. Lanny regarding her Hep B lab result. Patient had several questions about the diagnosis. Patient requested an office visit to further discuss. Let patient know that Dr. Lanny was not in the office today and that her message would be forwarded to Dr. Lanny so that she is aware. Let patient know that her message request for an office visit would be forwarded to the scheduler.  Patient voiced understanding.

## 2023-11-20 ENCOUNTER — Ambulatory Visit (HOSPITAL_COMMUNITY)
Admission: RE | Admit: 2023-11-20 | Discharge: 2023-11-20 | Disposition: A | Discharge status: Home / Self Care | Payer: Self-pay | Source: Ambulatory Visit | Attending: Hematology | Admitting: Hematology

## 2023-11-20 ENCOUNTER — Inpatient Hospital Stay (HOSPITAL_BASED_OUTPATIENT_CLINIC_OR_DEPARTMENT_OTHER): Payer: Self-pay | Admitting: Hematology

## 2023-11-20 ENCOUNTER — Inpatient Hospital Stay: Payer: Self-pay | Attending: Hematology

## 2023-11-20 VITALS — BP 98/64 | HR 77 | Temp 97.6°F | Resp 16 | Ht 61.0 in | Wt 251.1 lb

## 2023-11-20 DIAGNOSIS — D693 Immune thrombocytopenic purpura: Secondary | ICD-10-CM

## 2023-11-20 DIAGNOSIS — Z86711 Personal history of pulmonary embolism: Secondary | ICD-10-CM | POA: Insufficient documentation

## 2023-11-20 DIAGNOSIS — Z86718 Personal history of other venous thrombosis and embolism: Secondary | ICD-10-CM | POA: Insufficient documentation

## 2023-11-20 DIAGNOSIS — I82492 Acute embolism and thrombosis of other specified deep vein of left lower extremity: Secondary | ICD-10-CM

## 2023-11-20 DIAGNOSIS — Z7901 Long term (current) use of anticoagulants: Secondary | ICD-10-CM | POA: Insufficient documentation

## 2023-11-20 DIAGNOSIS — B181 Chronic viral hepatitis B without delta-agent: Secondary | ICD-10-CM

## 2023-11-20 DIAGNOSIS — M25551 Pain in right hip: Secondary | ICD-10-CM

## 2023-11-20 LAB — CBC WITH DIFFERENTIAL (CANCER CENTER ONLY)
Abs Immature Granulocytes: 0.01 K/uL (ref 0.00–0.07)
Basophils Absolute: 0 K/uL (ref 0.0–0.1)
Basophils Relative: 1 %
Eosinophils Absolute: 0.2 K/uL (ref 0.0–0.5)
Eosinophils Relative: 4 %
HCT: 38.5 % (ref 36.0–46.0)
Hemoglobin: 12.6 g/dL (ref 12.0–15.0)
Immature Granulocytes: 0 %
Lymphocytes Relative: 32 %
Lymphs Abs: 1.9 K/uL (ref 0.7–4.0)
MCH: 28.8 pg (ref 26.0–34.0)
MCHC: 32.7 g/dL (ref 30.0–36.0)
MCV: 88.1 fL (ref 80.0–100.0)
Monocytes Absolute: 0.5 K/uL (ref 0.1–1.0)
Monocytes Relative: 9 %
Neutro Abs: 3.3 K/uL (ref 1.7–7.7)
Neutrophils Relative %: 54 %
Platelet Count: 419 K/uL — ABNORMAL HIGH (ref 150–400)
RBC: 4.37 MIL/uL (ref 3.87–5.11)
RDW: 14.6 % (ref 11.5–15.5)
WBC Count: 6 K/uL (ref 4.0–10.5)
nRBC: 0 % (ref 0.0–0.2)

## 2023-11-20 LAB — CMP (CANCER CENTER ONLY)
ALT: 17 U/L (ref 0–44)
AST: 16 U/L (ref 15–41)
Albumin: 3.9 g/dL (ref 3.5–5.0)
Alkaline Phosphatase: 58 U/L (ref 38–126)
Anion gap: 6 (ref 5–15)
BUN: 13 mg/dL (ref 6–20)
CO2: 27 mmol/L (ref 22–32)
Calcium: 9.3 mg/dL (ref 8.9–10.3)
Chloride: 104 mmol/L (ref 98–111)
Creatinine: 0.47 mg/dL (ref 0.44–1.00)
GFR, Estimated: >60 mL/min (ref >60)
Glucose, Bld: 121 mg/dL — ABNORMAL HIGH (ref 70–99)
Potassium: 4.2 mmol/L (ref 3.5–5.1)
Sodium: 137 mmol/L (ref 135–145)
Total Bilirubin: 0.2 mg/dL (ref 0.0–1.2)
Total Protein: 6.8 g/dL (ref 6.5–8.1)

## 2023-11-20 NOTE — Assessment & Plan Note (Signed)
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 She responded to dexamethasone , IVIG, and Nplate  quickly in a few days.  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

## 2023-11-20 NOTE — Assessment & Plan Note (Signed)
 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  She was bridged with Lovenox  and discharged 10/10/19 on Coumadin .   -She was scheduled to begin outpatient Rituxan  for refractory ITP on 7/26 but presented to ED with severe thrombocytopenia platelet less than 5K and gum bleeding.   -CTA showed at least submassive bilateral PE. Vascular surgery did not recommend IVC filter due to risk for occluding her left iliac vein stent while off anticoagulation for the procedure, then occluding her IVC filter therefore leaving with no surgical options.   -Treated aggressively for ITP including platelet transfusion, 40 mg Dex daily for 4 days, IVIG, and Nplate .  Anticoagulation was held until platelets greater than 30 then received heparin  without bolus.   -S/p first dose of Rituxan  for refractory ITP on 7/29 in the hospital, plan for weekly x4.   -discharged on coumadin  which was eventually changed to Xarelto  in 2022 -changed Xarelto  to Eliquis  due to oral bleeding, but she is back on Xarelto  now

## 2023-11-20 NOTE — Progress Notes (Signed)
 [facility]   [phone] [phone]   Clinic Follow up Note   Patient Care Team: Kateri Linden, MD as PCP - General (Family Medicine) Freddie Lynwood HERO, MD as Consulting Physician (Oncology) Dusty Rush (Hematology and Oncology) Maree Lonni Inks, MD as Consulting Physician (Ophthalmology) Nivia Charletta BIRCH, MD (Internal Medicine)  Date of Service:  11/20/2023  CHIEF COMPLAINT: Right hip pain  CURRENT THERAPY:  Xarelto   Oncology History   DVT (deep venous thrombosis) (HCC) presented to ED 7/20 with left-sided swelling found to have extensive left iliofemoral DVT, 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  She was bridged with Lovenox  and discharged 10/10/19 on Coumadin .   -She was scheduled to begin outpatient Rituxan  for refractory ITP on 7/26 but presented to ED with severe thrombocytopenia platelet less than 5K and gum bleeding.   -CTA showed at least submassive bilateral PE. Vascular surgery did not recommend IVC filter due to risk for occluding her left iliac vein stent while off anticoagulation for the procedure, then occluding her IVC filter therefore leaving with no surgical options.   -Treated aggressively for ITP including platelet transfusion, 40 mg Dex daily for 4 days, IVIG, and Nplate .  Anticoagulation was held until platelets greater than 30 then received heparin  without bolus.   -S/p first dose of Rituxan  for refractory ITP on 7/29 in the hospital, plan for weekly x4.   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 She responded to dexamethasone , IVIG, and Nplate  quickly in a few days.  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 Pain radiates from the hip to the knee, exacerbated by ambulation, and alleviated when sitting. No swelling or skin changes. Differential diagnosis includes arthritis, less likely fracture or infection. No recent trauma, fever, or chills. Pain managed with acetaminophen , it's better today - Order right hip X[facility] at [facility]. - Refer to orthopedic surgeon at [facility] for further evaluation. - Instruct her to contact orthopedic office to discuss financial assistance options. - Advise continuation of acetaminophen  for pain management.  Plan - X-ray of right hip today at the [facility] - I will refer her to [facility] orthopedic surgery for further evaluation - Lab reviewed, CBC normal.  Follow-up in October as previous scheduled.  Discussed the use of AI scribe software for clinical note transcription with the patient, who gave verbal consent to proceed.  History of Present Illness [name] is a 48 year old female with refractory relapsed ITP who presents with right side pain.  She experiences right side pain since Friday, intensifying on Saturday,  localized in the right hip and radiating to the knee. The pain worsens with walking and is less severe when sitting. There is no swelling or skin issues in the area.  She takes Tylenol  for pain relief, which provides partial relief. She denies fever or chills but feels cold. She is on Xarelto  daily for blood clots related to her refractory relapsed ITP.     All other systems were reviewed with the patient and are negative.  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    SURGICAL HISTORY: Past Surgical History:  Procedure Laterality Date   BREAST SURGERY  biopsy   COLONOSCOPY WITH PROPOFOL  N/A 09/09/2020   Procedure: COLONOSCOPY WITH PROPOFOL ;  Surgeon: Legrand Victory LITTIE DOUGLAS, MD;  Location: [facility];  Service: Gastroenterology;  Laterality: N/A;   LOWER EXTREMITY VENOGRAPHY Left 10/08/2019   Procedure: LOWER EXTREMITY VENOGRAPHY;  Surgeon: Gretta Lonni PARAS, MD;  [facility]: [facility];  Service: Cardiovascular;  Laterality: Left;   PERIPHERAL VASCULAR INTERVENTION Left 10/08/2019   Procedure: PERIPHERAL VASCULAR INTERVENTION;  Surgeon: Gretta Lonni PARAS, MD;  Location: [facility];  Service: Cardiovascular;  Laterality: Left;  LT COMMON ILIAC VEIN   PERIPHERAL VASCULAR THROMBECTOMY N/A 10/08/2019   Procedure: PERIPHERAL VASCULAR THROMBECTOMY;  Surgeon: Gretta Lonni PARAS, MD;  Location: [facility];  Service: Cardiovascular;  Laterality: N/A;   SPLENECTOMY, TOTAL N/A 09/16/2015   Procedure: OPEN SPLENECTOMY;  Surgeon: Deward Null III, MD;  Location: [facility] OR;  Service: General;  Laterality: N/A;    I have reviewed the social history and family history with the patient and they are unchanged from previous note.  ALLERGIES:  has no known allergies.  MEDICATIONS:  Current Outpatient Medications  Medication Sig Dispense Refill   acetaminophen  (TYLENOL ) 500 MG tablet Take 1,000 mg by mouth every 6 (six)  hours as needed for mild pain (pain score 1-3) or headache.     lidocaine -prilocaine  (EMLA ) cream Apply to affected area once 30 g 3   rivaroxaban  (XARELTO ) 20 MG TABS tablet Take 1 tablet (20 mg total) by mouth daily with supper. 30 tablet 5   No current facility-administered medications for this visit.    PHYSICAL EXAMINATION: ECOG PERFORMANCE STATUS: 1 - Symptomatic but completely ambulatory  Vitals:   11/20/23 1049  BP: 98/64  Pulse: 77  Resp: 16  Temp: 97.6 F (36.4 C)  SpO2: 98%   Wt Readings from Last 3 Encounters:  11/20/23 251 lb 1.6 oz (113.9 kg)  10/09/23 252 lb 12.8 oz (114.7 kg)  09/16/23 250 lb (113.4 kg)     GENERAL:alert, no distress and comfortable SKIN: skin color, texture, turgor are normal, no rashes or significant lesions Musculoskeletal:no cyanosis of digits and no clubbing, no skin change, edema or tenderness of the right hip.  Patient is able to walk without any difficulty. NEURO: alert & oriented x 3 with fluent speech, no focal motor/sensory deficits  Physical Exam   LABORATORY DATA:  I have reviewed the data as listed    Latest Ref Rng & Units 11/20/2023   10:29 AM 10/09/2023    9:12 AM 10/02/2023   10:38 AM  CBC  WBC 4.0 - 10.5 K/uL 6.0  8.4  9.3   Hemoglobin  12.0 - 15.0 g/dL 87.3  87.4  88.5   Hematocrit 36.0 - 46.0 % 38.5  37.7  33.9   Platelets 150 - 400 K/uL 419  352  367         Latest Ref Rng & Units 11/20/2023   10:29 AM 10/09/2023    9:12 AM 10/02/2023   10:38 AM  CMP  Glucose 70 - 99 mg/dL 878  832  862   BUN 6 - 20 mg/dL 13  15  13    Creatinine 0.44 - 1.00 mg/dL 9.52  9.48  9.50   Sodium 135 - 145 mmol/L 137  135  135   Potassium 3.5 - 5.1 mmol/L 4.2  4.0  3.8   Chloride 98 - 111 mmol/L 104  104  106   CO2 22 - 32 mmol/L 27  24  23    Calcium 8.9 - 10.3 mg/dL 9.3  8.7  8.4   Total Protein 6.5 - 8.1 g/dL 6.8  7.1  6.5   Total Bilirubin 0.0 - 1.2 mg/dL 0.2  0.3  0.3   Alkaline Phos 38 - 126 U/L 58  69  67   AST 15 - 41 U/L 16   13  13    ALT 0 - 44 U/L 17  17  16        RADIOGRAPHIC STUDIES: I have personally reviewed the radiological images as listed and agreed with the findings in the report. No results found.    Orders Placed This Encounter  Procedures   DG HIP UNILAT WITH PELVIS 2-3 VIEWS RIGHT    Standing Status:   Future    Number of Occurrences:   1    Expected Date:   11/20/2023    Expiration Date:   11/19/2024    Reason for Exam (SYMPTOM  OR DIAGNOSIS REQUIRED):   righthip pain    Is patient pregnant?:   No    Preferred imaging location?:   [facility]   Ambulatory referral to Orthodontics    Referral Priority:   Urgent    Referral Type:   Consultation    Referral Reason:   Specialty Services Required    Requested Specialty:   Orthodontics    Number of Visits Requested:   1   All questions were answered. The patient knows to call the clinic with any problems, questions or concerns. No barriers to learning was detected. The total time spent in the appointment was 15 minutes, including review of chart and various tests results, discussions about plan of care and coordination of care plan     Onita Mattock, MD 11/20/2023

## 2023-11-21 LAB — HEPATITIS B DNA, ULTRAQUANTITATIVE, PCR
HBV DNA SERPL PCR-ACNC: NOT DETECTED [IU]/mL
HBV DNA SERPL PCR-LOG IU: UNDETERMINED {Log_IU}/mL

## 2023-11-23 ENCOUNTER — Ambulatory Visit: Payer: Self-pay | Admitting: Orthopaedic Surgery

## 2023-11-23 DIAGNOSIS — M5416 Radiculopathy, lumbar region: Secondary | ICD-10-CM

## 2023-11-23 DIAGNOSIS — Z6841 Body Mass Index (BMI) 40.0 and over, adult: Secondary | ICD-10-CM

## 2023-11-23 MED ORDER — METHYLPREDNISOLONE 4 MG PO TBPK: ORAL_TABLET | ORAL | 0 refills | Status: AC

## 2023-11-23 NOTE — Progress Notes (Signed)
 Office Visit Note   Patient: [name]           Date of Birth: 07/05/1975           MRN: [id number] Visit Date: 11/23/2023              Requested by: Lanny Callander, MD [address] [place],  [place] [address] PCP: Kateri Linden, MD   Assessment & Plan: Visit Diagnoses:  1. Lumbar radiculopathy, right   2. Body mass index 45.0-49.9, adult (HCC)     Plan: History of Present Illness [name] is a 48 year old female who presents with right hip pain radiating to the foot.  She experiences persistent and severe right hip pain radiating from her back down to her foot, significantly impacting her ability to walk. The pain does not extend to her groin or ankle. An MRI in 2021 showed mild lumbar spine degeneration. She has not participated in physical therapy for her back issues. Since 2017, she has had difficulty walking and requires support.  Physical Exam MUSCULOSKELETAL: She has back pain upon movement of her right lower extremity and hip joint.  She has no groin pain.  No motor or sensory deficits.  Assessment and Plan Lumbar radiculopathy with right lower extremity radiation Chronic lumbar radiculopathy with right leg radiation, likely from lumbar spine degeneration. MRI showed mild degeneration from a few years ago. No hip joint involvement on x-ray. Pain localized to back. - Prescribe brief course of oral steroids. - Refer to physical therapy for back rehabilitation. - Advise against NSAIDs due to Xarelto  use.  Obesity contributing to musculoskeletal symptoms Obesity exacerbating stress on back, hip, and legs, worsening musculoskeletal symptoms. - Advise on weight loss to reduce musculoskeletal stress.  Language barrier increased complexity of visit.  Follow-Up Instructions: No follow-ups on file.   Orders:  Orders Placed This Encounter  Procedures   Ambulatory referral to Physical Therapy   Meds ordered this encounter  Medications    methylPREDNISolone  (MEDROL  DOSEPAK) 4 MG TBPK tablet    Sig: Take as directed    Dispense:  21 tablet    Refill:  0      Procedures: No procedures performed   Clinical Data: No additional findings.   Subjective: Chief Complaint  Patient presents with   Right Hip - Pain    HPI  Review of Systems  Constitutional: Negative.   HENT: Negative.    Eyes: Negative.   Respiratory: Negative.    Cardiovascular: Negative.   Endocrine: Negative.   Musculoskeletal:  Positive for back pain.  Neurological: Negative.   Hematological: Negative.   Psychiatric/Behavioral: Negative.    All other systems reviewed and are negative.    Objective: Vital Signs: There were no vitals taken for this visit.  Physical Exam Vitals and nursing note reviewed.  Constitutional:      Appearance: She is well-developed.  HENT:     Head: Atraumatic.     Nose: Nose normal.  Eyes:     Extraocular Movements: Extraocular movements intact.  Cardiovascular:     Pulses: Normal pulses.  Pulmonary:     Effort: Pulmonary effort is normal.  Abdominal:     Palpations: Abdomen is soft.  Musculoskeletal:     Cervical back: Neck supple.  Skin:    General: Skin is warm.     Capillary Refill: Capillary refill takes less than 2 seconds.  Neurological:     Mental Status: She is alert. Mental status is at baseline.  Psychiatric:        Behavior: Behavior normal.        Thought Content: Thought content normal.        Judgment: Judgment normal.     Ortho Exam  Specialty Comments:  No specialty comments available.  Imaging: No results found.   PMFS History: Patient Active Problem List   Diagnosis Date Noted   Body mass index 45.0-49.9, adult (HCC) 11/23/2023   Tinnitus of right ear    Right-sided headache 03/26/2021   Lumbar radiculopathy, right 11/13/2019   History of pulmonary embolism    Depression    GERD (gastroesophageal reflux disease)    May-Thurner syndrome    DVT (deep venous  thrombosis) (HCC) 10/07/2019   History of ITP    Leukocytosis    Chronic headaches 08/28/2019   Thrombocytopenia (HCC) 03/02/2019   Borderline diabetes 10/16/2018   Idiopathic thrombocytopenic purpura (ITP) (HCC) 10/14/2018   Morbid obesity (HCC) 10/14/2018   Hyperglycemia 10/14/2018   Retinal hemorrhage of both eyes 10/05/2015   Chronic ITP (idiopathic thrombocytopenia) (HCC)    Nontraumatic intracerebral hemorrhage (HCC)    Menorrhagia with regular cycle    Pressure ulcer 09/19/2015   Gingival bleeding    Chronic hepatitis B (HCC) 09/04/2015   Subarachnoidal hemorrhage (HCC) 09/04/2015   Severe thrombocytopenia (HCC) 09/04/2015   Acute ITP (HCC) 08/28/2015   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    Family History  Problem Relation Age of Onset   Diabetes Mother    Breast cancer Mother    Diabetes Father    Uterine cancer Sister    Breast cancer Sister    Breast cancer Sister    Breast cancer Sister    Colon cancer Neg Hx    Pancreatic cancer Neg Hx    Esophageal cancer Neg Hx    Stomach cancer Neg Hx    Liver disease Neg Hx     Past Surgical History:  Procedure Laterality Date   BREAST SURGERY  biopsy   COLONOSCOPY WITH PROPOFOL  N/A 09/09/2020   Procedure: COLONOSCOPY WITH PROPOFOL ;  Surgeon: Legrand Victory LITTIE DOUGLAS, MD;  Location: [facility];  Service: Gastroenterology;  Laterality: N/A;   LOWER EXTREMITY VENOGRAPHY Left 10/08/2019   Procedure: LOWER EXTREMITY VENOGRAPHY;  Surgeon: Gretta Lonni PARAS, MD;  [facility]: [facility];  Service: Cardiovascular;  Laterality: Left;   PERIPHERAL VASCULAR INTERVENTION Left 10/08/2019   Procedure: PERIPHERAL VASCULAR INTERVENTION;  Surgeon: Gretta Lonni PARAS, MD;  [facility]: [facility];  Service:  Cardiovascular;  Laterality: Left;  LT COMMON ILIAC VEIN   PERIPHERAL VASCULAR THROMBECTOMY N/A 10/08/2019   Procedure: PERIPHERAL VASCULAR THROMBECTOMY;  Surgeon: Gretta Lonni PARAS, MD;  [facility]: [facility];  Service: Cardiovascular;  Laterality: N/A;   SPLENECTOMY, TOTAL N/A 09/16/2015   Procedure: OPEN SPLENECTOMY;  Surgeon: Deward Null III, MD;  Location: [facility];  Service: General;  Laterality: N/A;   Social History   Occupational History   Occupation: [employer]  Tobacco Use   Smoking status: Never   Smokeless tobacco: Never  Vaping Use   Vaping status: Never Used  Substance and Sexual Activity   Alcohol use: No    Alcohol/week: 0.0 standard drinks of alcohol   Drug use: No  Sexual activity: Not Currently

## 2023-11-29 ENCOUNTER — Ambulatory Visit: Payer: Self-pay | Admitting: Hematology

## 2023-11-29 NOTE — Telephone Encounter (Addendum)
 Contacted patient via telephone w/ spanish interpreter. LVM advising x-ray result, per Dr. Lanny.   ----- Message from Onita Lanny sent at 11/29/2023  6:56 AM EDT ----- Please let pt know her x-ray results, thx  Onita  ----- Message ----- From: Interface, Rad Results In Sent: 11/28/2023  10:25 PM EDT To: Onita Lanny, MD

## 2023-12-03 ENCOUNTER — Ambulatory Visit: Payer: Self-pay | Admitting: Gastroenterology

## 2023-12-04 ENCOUNTER — Other Ambulatory Visit: Payer: Self-pay

## 2023-12-04 ENCOUNTER — Telehealth: Payer: Self-pay

## 2023-12-04 NOTE — Telephone Encounter (Signed)
 Patient was not able to make it to her scheduled GI appointment. This medical assistant contacted [facility]. Rescheduled patient for their next available, 01/25/24 @ 10:40am.  Provided live spanish interpretor, Mliss w/ appt details. [facility] [address] [place] [address], [place], [place] [address] T#[phone] Mliss agreed to contact the patient to make her aware of the appointment.

## 2023-12-06 ENCOUNTER — Telehealth: Payer: Self-pay | Admitting: *Deleted

## 2024-01-01 ENCOUNTER — Other Ambulatory Visit: Payer: Self-pay

## 2024-01-01 ENCOUNTER — Inpatient Hospital Stay: Payer: Self-pay | Attending: Hematology

## 2024-01-01 ENCOUNTER — Inpatient Hospital Stay: Payer: Self-pay

## 2024-01-01 ENCOUNTER — Inpatient Hospital Stay: Payer: Self-pay | Admitting: Hematology

## 2024-01-01 VITALS — BP 94/78 | HR 70 | Temp 98.2°F | Resp 16 | Ht 61.0 in | Wt 248.0 lb

## 2024-01-01 DIAGNOSIS — I82492 Acute embolism and thrombosis of other specified deep vein of left lower extremity: Secondary | ICD-10-CM

## 2024-01-01 DIAGNOSIS — Z8619 Personal history of other infectious and parasitic diseases: Secondary | ICD-10-CM | POA: Insufficient documentation

## 2024-01-01 DIAGNOSIS — Z9081 Acquired absence of spleen: Secondary | ICD-10-CM | POA: Insufficient documentation

## 2024-01-01 DIAGNOSIS — D693 Immune thrombocytopenic purpura: Secondary | ICD-10-CM | POA: Insufficient documentation

## 2024-01-01 DIAGNOSIS — Z23 Encounter for immunization: Secondary | ICD-10-CM

## 2024-01-01 DIAGNOSIS — Z86711 Personal history of pulmonary embolism: Secondary | ICD-10-CM | POA: Insufficient documentation

## 2024-01-01 DIAGNOSIS — Z86718 Personal history of other venous thrombosis and embolism: Secondary | ICD-10-CM | POA: Insufficient documentation

## 2024-01-01 DIAGNOSIS — Z7901 Long term (current) use of anticoagulants: Secondary | ICD-10-CM | POA: Insufficient documentation

## 2024-01-01 LAB — CMP (CANCER CENTER ONLY)
ALT: 32 U/L (ref 0–44)
AST: 24 U/L (ref 15–41)
Albumin: 4.1 g/dL (ref 3.5–5.0)
Alkaline Phosphatase: 56 U/L (ref 38–126)
Anion gap: 5 (ref 5–15)
BUN: 17 mg/dL (ref 6–20)
CO2: 27 mmol/L (ref 22–32)
Calcium: 9.5 mg/dL (ref 8.9–10.3)
Chloride: 107 mmol/L (ref 98–111)
Creatinine: 0.54 mg/dL (ref 0.44–1.00)
GFR, Estimated: >60 mL/min (ref >60)
Glucose, Bld: 121 mg/dL — ABNORMAL HIGH (ref 70–99)
Potassium: 4 mmol/L (ref 3.5–5.1)
Sodium: 139 mmol/L (ref 135–145)
Total Bilirubin: 0.5 mg/dL (ref 0.0–1.2)
Total Protein: 6.7 g/dL (ref 6.5–8.1)

## 2024-01-01 LAB — CBC WITH DIFFERENTIAL (CANCER CENTER ONLY)
Abs Immature Granulocytes: 0.02 K/uL (ref 0.00–0.07)
Basophils Absolute: 0.1 K/uL (ref 0.0–0.1)
Basophils Relative: 1 %
Eosinophils Absolute: 0.2 K/uL (ref 0.0–0.5)
Eosinophils Relative: 2 %
HCT: 38 % (ref 36.0–46.0)
Hemoglobin: 12.6 g/dL (ref 12.0–15.0)
Immature Granulocytes: 0 %
Lymphocytes Relative: 26 %
Lymphs Abs: 2.1 K/uL (ref 0.7–4.0)
MCH: 28.6 pg (ref 26.0–34.0)
MCHC: 33.2 g/dL (ref 30.0–36.0)
MCV: 86.4 fL (ref 80.0–100.0)
Monocytes Absolute: 0.8 K/uL (ref 0.1–1.0)
Monocytes Relative: 10 %
Neutro Abs: 4.8 K/uL (ref 1.7–7.7)
Neutrophils Relative %: 61 %
Platelet Count: 459 K/uL — ABNORMAL HIGH (ref 150–400)
RBC: 4.4 MIL/uL (ref 3.87–5.11)
RDW: 14.6 % (ref 11.5–15.5)
WBC Count: 7.8 K/uL (ref 4.0–10.5)
nRBC: 0 % (ref 0.0–0.2)

## 2024-01-01 MED ORDER — INFLUENZA VIRUS VACC SPLIT PF (FLUZONE) 0.5 ML IM SUSY
0.5000 mL | PREFILLED_SYRINGE | Freq: Once | INTRAMUSCULAR | Status: DC
  Filled 2024-01-01: qty 0.5

## 2024-01-01 NOTE — Progress Notes (Signed)
 [facility]   [phone] [phone]   Clinic Follow up Note   Patient Care Team: Default, Provider, MD as PCP - General Freddie Lynwood HERO, MD as Consulting Physician (Oncology) Dusty Rush (Hematology and Oncology) Maree Lonni Inks, MD as Consulting Physician (Ophthalmology) Nivia Charletta BIRCH, MD (Internal Medicine)  Date of Service:  01/01/2024  CHIEF COMPLAINT: f/u of ITP and DVT  CURRENT THERAPY:  Xarelto  20 milligram daily  Oncology History   DVT (deep venous thrombosis) (HCC) presented to ED 7/20 with left-sided swelling found to have extensive left iliofemoral DVT, 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  She was bridged with Lovenox  and discharged 10/10/19 on Coumadin .   -She was scheduled to begin outpatient Rituxan  for refractory ITP on 7/26 but presented to ED with severe thrombocytopenia platelet less than 5K and gum bleeding.   -CTA showed at least submassive bilateral PE. Vascular surgery did not recommend IVC filter due to risk for occluding her left iliac vein stent while off anticoagulation for the procedure, then occluding her IVC filter therefore leaving with no surgical options.   -Treated aggressively for ITP including platelet transfusion, 40 mg Dex daily for 4 days, IVIG, and Nplate .  Anticoagulation was held until platelets greater than 30 then received heparin  without bolus.   -S/p first dose of Rituxan  for refractory ITP on 7/29 in the hospital, plan for weekly x4.   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 She responded to dexamethasone , IVIG, and Nplate  quickly in a few days.  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 She experienced an allergic reaction with itching after the last infusion, managed with Benadryl . There is a risk of future relapses, and she should watch for signs of bleeding. - Monitor for signs of bleeding and report any occurrences immediately. - Perform regular blood count checks, especially if seeing other healthcare providers. - Schedule follow-up appointment in six months. - Advise to call if there is a need to see the doctor sooner.  Resolved hepatitis B infection Resolved hepatitis B infection indicated by a positive core antibody test with no current active infection detected. - Contact Infectious Disease for further evaluation and potential treatment recommendations.  General Health Maintenance Recommended to receive the flu shot and COVID vaccine. - Advise to receive the flu shot and COVID vaccine.  Plan - Labs reviewed, platelet 459, rest of CBC normal. -Continue Xarelto  20 mg daily - Continue lab monitoring, follow-up in 6 months. - I will send a message to ID, to see if she needs  hepatitis B treatment due to recent Rituxan  treatment.    Discussed the use of AI scribe software for clinical note transcription with the patient, who gave verbal consent to proceed.  History  of Present Illness [name] is a 48 year old female with ITP who presents for follow-up.  She has no new symptoms, bleeding, or bruising. She has experienced relapses of ITP and completed an infusion a few months ago, which resulted in an allergic reaction with itching, relieved by Benadryl . She has a past hepatitis B infection, confirmed by a positive core antibody test.     All other systems were reviewed with the patient and are negative.  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    SURGICAL HISTORY: Past Surgical History:  Procedure Laterality Date   BREAST SURGERY  biopsy   COLONOSCOPY WITH PROPOFOL  N/A 09/09/2020   Procedure: COLONOSCOPY WITH PROPOFOL ;  Surgeon: Legrand Victory LITTIE DOUGLAS, MD;  Location: [facility];  Service: Gastroenterology;  Laterality: N/A;   LOWER EXTREMITY VENOGRAPHY Left 10/08/2019   Procedure: LOWER EXTREMITY VENOGRAPHY;  Surgeon: Gretta Lonni PARAS, MD;  [facility]: [facility];  Service: Cardiovascular;  Laterality: Left;   PERIPHERAL VASCULAR INTERVENTION Left 10/08/2019   Procedure: PERIPHERAL VASCULAR INTERVENTION;  Surgeon: Gretta Lonni PARAS, MD;  Location: [facility];  Service: Cardiovascular;  Laterality: Left;  LT COMMON ILIAC VEIN   PERIPHERAL VASCULAR THROMBECTOMY N/A 10/08/2019   Procedure: PERIPHERAL VASCULAR THROMBECTOMY;  Surgeon: Gretta Lonni PARAS, MD;  Location: [facility];  Service: Cardiovascular;  Laterality: N/A;   SPLENECTOMY, TOTAL N/A 09/16/2015   Procedure: OPEN SPLENECTOMY;  Surgeon: Deward Null III, MD;  Location: [facility];  Service: General;  Laterality: N/A;    I have reviewed the social history and family history with the patient and they are unchanged from previous  note.  ALLERGIES:  is allergic to egg yolk.  MEDICATIONS:  Current Outpatient Medications  Medication Sig Dispense Refill   acetaminophen  (TYLENOL ) 500 MG tablet Take 1,000 mg by mouth every 6 (six) hours as needed for mild pain (pain score 1-3) or headache.     lidocaine -prilocaine  (EMLA ) cream Apply to affected area once 30 g 3   methylPREDNISolone  (MEDROL  DOSEPAK) 4 MG TBPK tablet Take as directed 21 tablet 0   rivaroxaban  (XARELTO ) 20 MG TABS tablet Take 1 tablet (20 mg total) by mouth daily with supper. 30 tablet 5   No current facility-administered medications for this visit.    PHYSICAL EXAMINATION: ECOG PERFORMANCE STATUS: 1 - Symptomatic but completely ambulatory  Vitals:   01/01/24 1241  BP: 94/78  Pulse: 70  Resp: 16  Temp: 98.2 F (36.8 C)  SpO2: 99%   Wt Readings from Last 3 Encounters:  01/01/24 248 lb (112.5 kg)  11/20/23 251 lb 1.6 oz (113.9 kg)  10/09/23 252 lb 12.8 oz (114.7 kg)     GENERAL:alert, no distress and comfortable SKIN: skin color, texture, turgor are normal, no rashes or significant lesions EYES: normal, Conjunctiva are pink and non-injected, sclera clear NECK: supple, thyroid normal size, non-tender, without nodularity LYMPH:  no palpable lymphadenopathy in the cervical, axillary  LUNGS: clear to auscultation and percussion with normal breathing effort HEART: regular rate & rhythm and  no murmurs and no lower extremity edema ABDOMEN:abdomen soft, non-tender and normal bowel sounds Musculoskeletal:no cyanosis of digits and no clubbing  NEURO: alert & oriented x 3 with fluent speech, no focal motor/sensory deficits  Physical Exam    LABORATORY DATA:  I have reviewed the data as listed    Latest Ref Rng & Units 01/01/2024   12:15 PM 11/20/2023   10:29 AM 10/09/2023    9:12 AM  CBC  WBC 4.0 - 10.5 K/uL 7.8  6.0  8.4   Hemoglobin 12.0 - 15.0 g/dL 87.3  87.3  87.4   Hematocrit 36.0 - 46.0 % 38.0  38.5  37.7   Platelets 150 - 400 K/uL 459   419  352         Latest Ref Rng & Units 01/01/2024   12:15 PM 11/20/2023   10:29 AM 10/09/2023    9:12 AM  CMP  Glucose 70 - 99 mg/dL 878  878  832   BUN 6 - 20 mg/dL 17  13  15    Creatinine 0.44 - 1.00 mg/dL 9.45  9.52  9.48   Sodium 135 - 145 mmol/L 139  137  135   Potassium 3.5 - 5.1 mmol/L 4.0  4.2  4.0   Chloride 98 - 111 mmol/L 107  104  104   CO2 22 - 32 mmol/L 27  27  24    Calcium 8.9 - 10.3 mg/dL 9.5  9.3  8.7   Total Protein 6.5 - 8.1 g/dL 6.7  6.8  7.1   Total Bilirubin 0.0 - 1.2 mg/dL 0.5  0.2  0.3   Alkaline Phos 38 - 126 U/L 56  58  69   AST 15 - 41 U/L 24  16  13    ALT 0 - 44 U/L 32  17  17       RADIOGRAPHIC STUDIES: I have personally reviewed the radiological images as listed and agreed with the findings in the report. No results found.    No orders of the defined types were placed in this encounter.  All questions were answered. The patient knows to call the clinic with any problems, questions or concerns. No barriers to learning was detected. The total time spent in the appointment was 20 minutes, including review of chart and various tests results, discussions about plan of care and coordination of care plan     Onita Mattock, MD 01/01/2024

## 2024-01-01 NOTE — Assessment & Plan Note (Signed)
 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  She was bridged with Lovenox  and discharged 10/10/19 on Coumadin .   -She was scheduled to begin outpatient Rituxan  for refractory ITP on 7/26 but presented to ED with severe thrombocytopenia platelet less than 5K and gum bleeding.   -CTA showed at least submassive bilateral PE. Vascular surgery did not recommend IVC filter due to risk for occluding her left iliac vein stent while off anticoagulation for the procedure, then occluding her IVC filter therefore leaving with no surgical options.   -Treated aggressively for ITP including platelet transfusion, 40 mg Dex daily for 4 days, IVIG, and Nplate .  Anticoagulation was held until platelets greater than 30 then received heparin  without bolus.   -S/p first dose of Rituxan  for refractory ITP on 7/29 in the hospital, plan for weekly x4.   -discharged on coumadin  which was eventually changed to Xarelto  in 2022 -changed Xarelto  to Eliquis  due to oral bleeding, but she is back on Xarelto  now

## 2024-01-01 NOTE — Assessment & Plan Note (Signed)
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 She responded to dexamethasone , IVIG, and Nplate  quickly in a few days.  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

## 2024-01-01 NOTE — Progress Notes (Signed)
 No Flu Vaccine given d/t pt has allergy to eggs.

## 2024-01-02 ENCOUNTER — Telehealth: Payer: Self-pay | Admitting: Hematology

## 2024-01-02 NOTE — Telephone Encounter (Signed)
 I contacted [name] and informed her of her 6 month follow up appts. I LVM with the interpretor.

## 2024-01-24 NOTE — Progress Notes (Unsigned)
 Chief Complaint: Left upper quadrant pain  HPI:    [name] is a 48 year old Hispanic female known to Dr. Legrand, with a past medical history as listed below including ITP and DVT on Xarelto  (10/13/2019 echo with LVEF 60-65%), who was referred to me by Lanny Callander, MD for a complaint of left upper quadrant pain.      09/09/20 colonoscopy at [facility] for left lower quadrant abdominal pain rectal bleeding with redundant colon otherwise normal.  Patient told to use MiraLAX  daily.    09/24/2022 CTAP with contrast for left lower quadrant pain with no acute findings.  Prior splenectomy and hepatic steatosis.    05/30/2023 patient seen at [facility] for chronic left upper quadrant abdominal pain.  At that time noted history of splenectomy in the setting of ITP.  Discussed chronic constipation.  Labs at that time including TSH, recommended EGD and a bowel regimen.  Discussed NAFLD.  CTAP 10/24 steatosis, transaminases 10/24 unremarkable.  Had viral studies that day for hepatitis.  Hemochromatosis labs and celiac serologies.  Testing negative.  Patient was found to have iron deficiency anemia with her present saturation low at 8.    11/20/2023 hip x-ray with no acute findings.    01/01/2024 CBC with platelets 459 otherwise normal.  CMP with a glucose of 121 otherwise normal.  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    Past Surgical History:  Procedure Laterality Date   BREAST SURGERY  biopsy   COLONOSCOPY WITH PROPOFOL  N/A 09/09/2020   Procedure: COLONOSCOPY WITH PROPOFOL ;  Surgeon: Legrand Victory LITTIE DOUGLAS, MD;  Location: [facility];  Service: Gastroenterology;  Laterality: N/A;   LOWER EXTREMITY VENOGRAPHY Left 10/08/2019   Procedure: LOWER EXTREMITY VENOGRAPHY;  Surgeon: Gretta Lonni PARAS, MD;  [facility];  Service: Cardiovascular;  Laterality: Left;   PERIPHERAL VASCULAR INTERVENTION Left 10/08/2019   Procedure: PERIPHERAL VASCULAR INTERVENTION;  Surgeon: Gretta Lonni PARAS, MD;  [facility]: [facility];  Service: Cardiovascular;  Laterality: Left;  LT COMMON ILIAC VEIN   PERIPHERAL VASCULAR THROMBECTOMY N/A 10/08/2019   Procedure: PERIPHERAL VASCULAR THROMBECTOMY;  Surgeon: Gretta Lonni PARAS, MD;  [facility]: [facility];  Service: Cardiovascular;  Laterality: N/A;   SPLENECTOMY, TOTAL N/A 09/16/2015   Procedure: OPEN SPLENECTOMY;  Surgeon: Deward Null III, MD;  Location: [facility];  Service: General;  Laterality: N/A;    Current Outpatient Medications  Medication Sig Dispense Refill   acetaminophen  (TYLENOL ) 500 MG tablet Take 1,000 mg by mouth every 6 (six) hours as needed for mild pain (pain score 1-3) or headache.     lidocaine -prilocaine  (EMLA ) cream Apply to affected area once 30 g 3   methylPREDNISolone  (MEDROL  DOSEPAK) 4 MG TBPK tablet Take as directed 21 tablet 0   rivaroxaban  (XARELTO ) 20 MG TABS tablet Take 1 tablet (20 mg total) by mouth daily with supper. 30 tablet 5   No current facility-administered medications for this visit.    Allergies as of 01/25/2024 - Review Complete 11/20/2023  Allergen Reaction Noted   Egg yolk Anaphylaxis 01/01/2024    Family History  Problem Relation Age of Onset   Diabetes Mother    Breast cancer Mother    Diabetes Father    Uterine  cancer Sister    Breast cancer Sister    Breast cancer Sister    Breast cancer Sister    Colon cancer Neg Hx    Pancreatic cancer Neg Hx    Esophageal cancer Neg Hx    Stomach cancer Neg Hx    Liver disease Neg Hx     Social History   Socioeconomic History   Marital status: Married    Spouse name: Not on file   Number of children: 6   Years of education: Not on file   Highest education level: 9th grade  Occupational History   Occupation:  packs personal products  Tobacco Use   Smoking status: Never   Smokeless tobacco: Never  Vaping Use   Vaping status: Never Used  Substance and Sexual Activity   Alcohol use: No    Alcohol/week: 0.0 standard drinks of alcohol   Drug use: No   Sexual activity: Not Currently  Other Topics Concern   Not on file  Social History Narrative   Not on file   Social Drivers of Health   Financial Resource Strain: Not on file  Food Insecurity: No Food Insecurity (09/16/2023)   Hunger Vital Sign    Worried About Running Out of Food in the Last Year: Never true    Ran Out of Food in the Last Year: Never true  Transportation Needs: No Transportation Needs (09/16/2023)   PRAPARE - Administrator, Civil Service (Medical): No    Lack of Transportation (Non-Medical): No  Physical Activity: Not on file  Stress: Not on file  Social Connections: Not on file  Intimate Partner Violence: Not At Risk (09/16/2023)   Humiliation, Afraid, Rape, and Kick questionnaire    Fear of Current or Ex-Partner: No    Emotionally Abused: No    Physically Abused: No    Sexually Abused: No    Review of Systems:    Constitutional: No weight loss, fever, chills, weakness or fatigue HEENT: Eyes: No change in vision               Ears, Nose, Throat:  No change in hearing or congestion Skin: No rash or itching Cardiovascular: No chest pain, chest pressure or palpitations   Respiratory: No SOB or cough Gastrointestinal: See HPI and otherwise negative Genitourinary: No dysuria or change in urinary frequency Neurological: No headache, dizziness or syncope Musculoskeletal: No new muscle or joint pain Hematologic: No bleeding or bruising Psychiatric: No history of depression or anxiety    Physical Exam:  Vital signs: There were no vitals taken for this visit.  Constitutional:   Pleasant Caucasian female appears to be in NAD, Well developed, Well nourished, alert and cooperative Head:  Normocephalic and  atraumatic. Eyes:   PEERL, EOMI. No icterus. Conjunctiva pink. Ears:  Normal auditory acuity. Neck:  Supple Throat: Oral cavity and pharynx without inflammation, swelling or lesion.  Respiratory: Respirations even and unlabored. Lungs clear to auscultation bilaterally.   No wheezes, crackles, or rhonchi.  Cardiovascular: Normal S1, S2. No MRG. Regular rate and rhythm. No peripheral edema, cyanosis or pallor.  Gastrointestinal:  Soft, nondistended, nontender. No rebound or guarding. Normal bowel sounds. No appreciable masses or hepatomegaly. Rectal:  Not performed.  Msk:  Symmetrical without gross deformities. Without edema, no deformity or joint abnormality.  Neurologic:  Alert and  oriented x4;  grossly normal neurologically.  Skin:   Dry and intact without significant lesions or rashes. Psychiatric: Oriented to person, place and time. Demonstrates good  judgement and reason without abnormal affect or behaviors.  RELEVANT LABS AND IMAGING: CBC    Component Value Date/Time   WBC 7.8 01/01/2024 1215   WBC 14.1 (H) 09/19/2023 0609   RBC 4.40 01/01/2024 1215   HGB 12.6 01/01/2024 1215   HCT 38.0 01/01/2024 1215   HCT 27.3 (L) 09/21/2015 0900   PLT 459 (H) 01/01/2024 1215   MCV 86.4 01/01/2024 1215   MCH 28.6 01/01/2024 1215   MCHC 33.2 01/01/2024 1215   RDW 14.6 01/01/2024 1215   LYMPHSABS 2.1 01/01/2024 1215   MONOABS 0.8 01/01/2024 1215   EOSABS 0.2 01/01/2024 1215   BASOSABS 0.1 01/01/2024 1215    CMP     Component Value Date/Time   NA 139 01/01/2024 1215   K 4.0 01/01/2024 1215   CL 107 01/01/2024 1215   CO2 27 01/01/2024 1215   GLUCOSE 121 (H) 01/01/2024 1215   BUN 17 01/01/2024 1215   CREATININE 0.54 01/01/2024 1215   CALCIUM 9.5 01/01/2024 1215   PROT 6.7 01/01/2024 1215   ALBUMIN 4.1 01/01/2024 1215   AST 24 01/01/2024 1215   ALT 32 01/01/2024 1215   ALKPHOS 56 01/01/2024 1215   BILITOT 0.5 01/01/2024 1215   GFRNONAA >60 01/01/2024 1215   GFRAA >60 12/04/2019  9177    Assessment: 1.  Chronic left upper quadrant pain.  Plan: 1. ***     Alice Failing, PA-C [facility] 01/24/2024, 3:49 PM  Cc: Lanny Callander, MD

## 2024-01-25 ENCOUNTER — Ambulatory Visit (INDEPENDENT_AMBULATORY_CARE_PROVIDER_SITE_OTHER): Payer: Self-pay | Admitting: Physician Assistant

## 2024-01-25 ENCOUNTER — Encounter: Payer: Self-pay | Admitting: Physician Assistant

## 2024-01-25 VITALS — BP 118/60 | HR 78 | Ht 61.0 in | Wt 248.4 lb

## 2024-01-25 DIAGNOSIS — G8929 Other chronic pain: Secondary | ICD-10-CM

## 2024-01-25 DIAGNOSIS — K219 Gastro-esophageal reflux disease without esophagitis: Secondary | ICD-10-CM

## 2024-01-25 DIAGNOSIS — R1012 Left upper quadrant pain: Secondary | ICD-10-CM

## 2024-01-25 DIAGNOSIS — Z862 Personal history of diseases of the blood and blood-forming organs and certain disorders involving the immune mechanism: Secondary | ICD-10-CM

## 2024-01-25 DIAGNOSIS — K5900 Constipation, unspecified: Secondary | ICD-10-CM

## 2024-01-25 MED ORDER — LINACLOTIDE 72 MCG PO CAPS: 72.0000 ug | ORAL_CAPSULE | Freq: Every day | ORAL | 3 refills | Status: AC

## 2024-01-25 NOTE — Patient Instructions (Addendum)
 Hemos enviado los siguientes medicamentos a su farmacia para que los recoja cuando le sea conveniente:  Linzess 72 mcg al da antes del desayuno.  Recuerde su cita de seguimiento en 3 meses o antes si fuera necesario.    We have sent the following medications to your pharmacy for you to pick up at your convenience: Linzess 72 mcg daily before breakfast.   Follow up in 3 months or sooner if needed.

## 2024-07-02 ENCOUNTER — Inpatient Hospital Stay: Payer: Self-pay

## 2024-07-02 ENCOUNTER — Inpatient Hospital Stay: Payer: Self-pay | Admitting: Hematology
